# Patient Record
Sex: Male | Born: 1950
Health system: Southern US, Community
[De-identification: ages and names within clinical notes are randomized; demographics above are authoritative.]

## PROBLEM LIST (undated history)

## (undated) ENCOUNTER — Emergency Department (HOSPITAL_COMMUNITY): Discharge status: Absent without leave | Payer: Medicare Other

## (undated) DIAGNOSIS — F32A Depression, unspecified: Secondary | ICD-10-CM

## (undated) DIAGNOSIS — J449 Chronic obstructive pulmonary disease, unspecified: Secondary | ICD-10-CM

## (undated) DIAGNOSIS — F17200 Nicotine dependence, unspecified, uncomplicated: Secondary | ICD-10-CM

## (undated) DIAGNOSIS — E785 Hyperlipidemia, unspecified: Secondary | ICD-10-CM

## (undated) DIAGNOSIS — I10 Essential (primary) hypertension: Secondary | ICD-10-CM

## (undated) DIAGNOSIS — F1911 Other psychoactive substance abuse, in remission: Secondary | ICD-10-CM

## (undated) DIAGNOSIS — F329 Major depressive disorder, single episode, unspecified: Secondary | ICD-10-CM

## (undated) DIAGNOSIS — F1210 Cannabis abuse, uncomplicated: Secondary | ICD-10-CM

## (undated) DIAGNOSIS — I639 Cerebral infarction, unspecified: Secondary | ICD-10-CM

## (undated) DIAGNOSIS — R7303 Prediabetes: Secondary | ICD-10-CM

## (undated) DIAGNOSIS — R972 Elevated prostate specific antigen [PSA]: Secondary | ICD-10-CM

## (undated) DIAGNOSIS — Z87891 Personal history of nicotine dependence: Secondary | ICD-10-CM

## (undated) HISTORY — PX: COLONOSCOPY: SHX174

## (undated) HISTORY — DX: Major depressive disorder, single episode, unspecified: F32.9

## (undated) HISTORY — DX: Hyperlipidemia, unspecified: E78.5

## (undated) HISTORY — DX: Nicotine dependence, unspecified, uncomplicated: F17.200

## (undated) HISTORY — DX: Chronic obstructive pulmonary disease, unspecified: J44.9

## (undated) HISTORY — DX: Prediabetes: R73.03

## (undated) HISTORY — DX: Depression, unspecified: F32.A

## (undated) HISTORY — DX: Cerebral infarction, unspecified: I63.9

## (undated) HISTORY — DX: Elevated prostate specific antigen (PSA): R97.20

## (undated) HISTORY — DX: Essential (primary) hypertension: I10

---

## 2002-09-09 ENCOUNTER — Inpatient Hospital Stay (HOSPITAL_COMMUNITY): Admission: EM | Admit: 2002-09-09 | Discharge: 2002-09-13 | Payer: Self-pay | Admitting: Emergency Medicine

## 2002-09-09 ENCOUNTER — Encounter: Payer: Self-pay | Admitting: Emergency Medicine

## 2002-09-10 ENCOUNTER — Encounter: Payer: Self-pay | Admitting: Internal Medicine

## 2003-03-02 ENCOUNTER — Ambulatory Visit (HOSPITAL_COMMUNITY): Admission: RE | Admit: 2003-03-02 | Discharge: 2003-03-02 | Payer: Self-pay | Admitting: Internal Medicine

## 2003-03-07 ENCOUNTER — Encounter (HOSPITAL_COMMUNITY): Admission: RE | Admit: 2003-03-07 | Discharge: 2003-04-06 | Payer: Self-pay | Admitting: Internal Medicine

## 2003-08-11 ENCOUNTER — Ambulatory Visit (HOSPITAL_COMMUNITY): Admission: RE | Admit: 2003-08-11 | Discharge: 2003-08-11 | Payer: Self-pay | Admitting: Emergency Medicine

## 2003-08-22 ENCOUNTER — Inpatient Hospital Stay (HOSPITAL_COMMUNITY): Admission: EM | Admit: 2003-08-22 | Discharge: 2003-08-25 | Payer: Self-pay | Admitting: *Deleted

## 2003-08-28 ENCOUNTER — Emergency Department (HOSPITAL_COMMUNITY): Admission: EM | Admit: 2003-08-28 | Discharge: 2003-08-28 | Payer: Self-pay | Admitting: Emergency Medicine

## 2003-11-08 ENCOUNTER — Ambulatory Visit: Payer: Self-pay | Admitting: Family Medicine

## 2003-11-22 ENCOUNTER — Ambulatory Visit: Payer: Self-pay | Admitting: Internal Medicine

## 2003-11-22 ENCOUNTER — Ambulatory Visit (HOSPITAL_COMMUNITY): Admission: RE | Admit: 2003-11-22 | Discharge: 2003-11-22 | Payer: Self-pay | Admitting: Internal Medicine

## 2003-12-23 ENCOUNTER — Inpatient Hospital Stay (HOSPITAL_COMMUNITY): Admission: EM | Admit: 2003-12-23 | Discharge: 2003-12-26 | Payer: Self-pay | Admitting: Emergency Medicine

## 2003-12-28 ENCOUNTER — Ambulatory Visit: Payer: Self-pay | Admitting: Family Medicine

## 2004-02-01 ENCOUNTER — Ambulatory Visit: Payer: Self-pay | Admitting: Family Medicine

## 2004-02-17 ENCOUNTER — Ambulatory Visit (HOSPITAL_COMMUNITY): Admission: RE | Admit: 2004-02-17 | Discharge: 2004-02-17 | Payer: Self-pay | Admitting: Pulmonary Disease

## 2004-03-05 ENCOUNTER — Ambulatory Visit: Payer: Self-pay | Admitting: Family Medicine

## 2004-03-08 ENCOUNTER — Ambulatory Visit: Payer: Self-pay | Admitting: Family Medicine

## 2004-03-08 ENCOUNTER — Ambulatory Visit (HOSPITAL_COMMUNITY): Admission: RE | Admit: 2004-03-08 | Discharge: 2004-03-08 | Payer: Self-pay | Admitting: Family Medicine

## 2004-03-12 ENCOUNTER — Ambulatory Visit: Payer: Self-pay | Admitting: Family Medicine

## 2004-04-23 ENCOUNTER — Ambulatory Visit: Payer: Self-pay | Admitting: Family Medicine

## 2004-05-01 ENCOUNTER — Emergency Department (HOSPITAL_COMMUNITY): Admission: EM | Admit: 2004-05-01 | Discharge: 2004-05-01 | Payer: Self-pay | Admitting: Emergency Medicine

## 2005-11-04 ENCOUNTER — Ambulatory Visit: Payer: Self-pay | Admitting: Family Medicine

## 2005-11-05 ENCOUNTER — Ambulatory Visit (HOSPITAL_COMMUNITY): Admission: RE | Admit: 2005-11-05 | Discharge: 2005-11-05 | Payer: Self-pay | Admitting: Family Medicine

## 2005-12-10 ENCOUNTER — Ambulatory Visit: Payer: Self-pay | Admitting: Family Medicine

## 2006-01-07 DIAGNOSIS — I639 Cerebral infarction, unspecified: Secondary | ICD-10-CM

## 2006-01-07 HISTORY — DX: Cerebral infarction, unspecified: I63.9

## 2006-01-28 ENCOUNTER — Ambulatory Visit: Payer: Self-pay | Admitting: Family Medicine

## 2006-01-28 LAB — CONVERTED CEMR LAB
CO2: 25 meq/L (ref 19–32)
Calcium: 9.7 mg/dL (ref 8.4–10.5)
Chloride: 105 meq/L (ref 96–112)
Potassium: 4.3 meq/L (ref 3.5–5.3)
Sodium: 144 meq/L (ref 135–145)

## 2006-04-28 ENCOUNTER — Ambulatory Visit: Payer: Self-pay | Admitting: Family Medicine

## 2006-04-28 LAB — CONVERTED CEMR LAB
Albumin: 4.5 g/dL (ref 3.5–5.2)
Alkaline Phosphatase: 59 units/L (ref 39–117)
HDL: 64 mg/dL (ref >39)
LDL Cholesterol: 155 mg/dL — ABNORMAL HIGH (ref 0–99)
Total CHOL/HDL Ratio: 3.6
Total Protein: 7.1 g/dL (ref 6.0–8.3)
Triglycerides: 55 mg/dL (ref <150)

## 2006-06-05 ENCOUNTER — Ambulatory Visit: Payer: Self-pay | Admitting: Family Medicine

## 2006-06-17 ENCOUNTER — Encounter: Payer: Self-pay | Admitting: Family Medicine

## 2006-06-17 LAB — CONVERTED CEMR LAB
Calcium: 9.2 mg/dL (ref 8.4–10.5)
Chloride: 103 meq/L (ref 96–112)
Creatinine, Ser: 0.96 mg/dL (ref 0.40–1.50)
Sodium: 140 meq/L (ref 135–145)

## 2006-07-15 ENCOUNTER — Ambulatory Visit (HOSPITAL_COMMUNITY): Admission: RE | Admit: 2006-07-15 | Discharge: 2006-07-15 | Payer: Self-pay | Admitting: General Surgery

## 2006-07-15 LAB — HM COLONOSCOPY

## 2006-07-28 ENCOUNTER — Ambulatory Visit: Payer: Self-pay | Admitting: Family Medicine

## 2006-07-28 ENCOUNTER — Ambulatory Visit (HOSPITAL_COMMUNITY): Admission: RE | Admit: 2006-07-28 | Discharge: 2006-07-28 | Payer: Self-pay | Admitting: Family Medicine

## 2006-07-28 LAB — CONVERTED CEMR LAB
Hemoglobin: 13.6 g/dL (ref 13.0–17.0)
Lymphocytes Relative: 5 % — ABNORMAL LOW (ref 12–46)
MCHC: 33.3 g/dL (ref 30.0–36.0)
Monocytes Absolute: 0.6 10*3/uL (ref 0.2–0.7)
Monocytes Relative: 6 % (ref 3–11)
Neutro Abs: 10 10*3/uL — ABNORMAL HIGH (ref 1.7–7.7)
RBC: 4.41 M/uL (ref 4.22–5.81)

## 2006-07-29 ENCOUNTER — Ambulatory Visit: Payer: Self-pay | Admitting: Family Medicine

## 2006-07-30 ENCOUNTER — Ambulatory Visit: Payer: Self-pay | Admitting: Family Medicine

## 2006-07-30 LAB — CONVERTED CEMR LAB
Specific Gravity, Urine: 1.04 — ABNORMAL HIGH (ref 1.005–1.03)
pH: 5.5 (ref 5.0–8.0)

## 2006-07-31 ENCOUNTER — Ambulatory Visit: Payer: Self-pay | Admitting: Family Medicine

## 2006-07-31 LAB — CONVERTED CEMR LAB
Basophils Relative: 0 % (ref 0–1)
Eosinophils Absolute: 0 10*3/uL (ref 0.0–0.7)
Hemoglobin: 13.1 g/dL (ref 13.0–17.0)
MCHC: 32 g/dL (ref 30.0–36.0)
MCV: 94.7 fL (ref 78.0–100.0)
Monocytes Absolute: 0.3 10*3/uL (ref 0.2–0.7)
Monocytes Relative: 7 % (ref 3–11)
Neutro Abs: 1.8 10*3/uL (ref 1.7–7.7)
RBC: 4.33 M/uL (ref 4.22–5.81)

## 2006-08-01 ENCOUNTER — Ambulatory Visit: Payer: Self-pay | Admitting: Family Medicine

## 2006-08-08 ENCOUNTER — Ambulatory Visit: Payer: Self-pay | Admitting: Family Medicine

## 2006-08-11 ENCOUNTER — Encounter: Payer: Self-pay | Admitting: Family Medicine

## 2006-08-11 ENCOUNTER — Ambulatory Visit (HOSPITAL_COMMUNITY): Admission: RE | Admit: 2006-08-11 | Discharge: 2006-08-11 | Payer: Self-pay | Admitting: Family Medicine

## 2006-08-11 LAB — CONVERTED CEMR LAB
AST: 14 units/L (ref 0–37)
Albumin: 4.4 g/dL (ref 3.5–5.2)
Alkaline Phosphatase: 66 units/L (ref 39–117)
Basophils Absolute: 0 10*3/uL (ref 0.0–0.1)
Basophils Relative: 1 % (ref 0–1)
Bilirubin, Direct: 0.1 mg/dL (ref 0.0–0.3)
Eosinophils Absolute: 0 10*3/uL (ref 0.0–0.7)
HDL: 54 mg/dL (ref >39)
Hemoglobin: 13.5 g/dL (ref 13.0–17.0)
Indirect Bilirubin: 0.4 mg/dL (ref 0.0–0.9)
MCHC: 32.2 g/dL (ref 30.0–36.0)
Monocytes Absolute: 0.2 10*3/uL (ref 0.2–0.7)
Neutro Abs: 2.2 10*3/uL (ref 1.7–7.7)
RDW: 13.6 % (ref 11.5–14.0)
Total Bilirubin: 0.5 mg/dL (ref 0.3–1.2)

## 2006-08-12 ENCOUNTER — Encounter: Payer: Self-pay | Admitting: Family Medicine

## 2006-09-12 ENCOUNTER — Ambulatory Visit: Payer: Self-pay | Admitting: Family Medicine

## 2006-11-14 ENCOUNTER — Ambulatory Visit: Payer: Self-pay | Admitting: Family Medicine

## 2006-11-15 ENCOUNTER — Ambulatory Visit: Payer: Self-pay | Admitting: Cardiology

## 2006-11-15 ENCOUNTER — Inpatient Hospital Stay (HOSPITAL_COMMUNITY): Admission: EM | Admit: 2006-11-15 | Discharge: 2006-11-18 | Payer: Self-pay | Admitting: Emergency Medicine

## 2006-11-25 ENCOUNTER — Ambulatory Visit: Payer: Self-pay | Admitting: Family Medicine

## 2006-11-27 ENCOUNTER — Ambulatory Visit: Payer: Self-pay | Admitting: Family Medicine

## 2007-01-14 ENCOUNTER — Ambulatory Visit: Payer: Self-pay | Admitting: Family Medicine

## 2007-01-14 LAB — CONVERTED CEMR LAB
Alkaline Phosphatase: 50 units/L (ref 39–117)
BUN: 10 mg/dL (ref 6–23)
Bilirubin, Direct: 0.1 mg/dL (ref 0.0–0.3)
CO2: 27 meq/L (ref 19–32)
Chloride: 102 meq/L (ref 96–112)
Creatinine, Ser: 0.9 mg/dL (ref 0.40–1.50)
Glucose, Bld: 88 mg/dL (ref 70–99)
Indirect Bilirubin: 0.5 mg/dL (ref 0.0–0.9)
LDL Cholesterol: 125 mg/dL — ABNORMAL HIGH (ref 0–99)
Total Bilirubin: 0.6 mg/dL (ref 0.3–1.2)
VLDL: 18 mg/dL (ref 0–40)

## 2007-01-26 ENCOUNTER — Ambulatory Visit: Payer: Self-pay | Admitting: Family Medicine

## 2007-01-26 ENCOUNTER — Emergency Department (HOSPITAL_COMMUNITY): Admission: EM | Admit: 2007-01-26 | Discharge: 2007-01-26 | Payer: Self-pay | Admitting: Emergency Medicine

## 2007-02-03 ENCOUNTER — Ambulatory Visit: Payer: Self-pay | Admitting: Family Medicine

## 2007-04-28 ENCOUNTER — Ambulatory Visit: Payer: Self-pay | Admitting: Family Medicine

## 2007-05-25 ENCOUNTER — Encounter: Payer: Self-pay | Admitting: Family Medicine

## 2007-05-25 LAB — CONVERTED CEMR LAB
Albumin: 4.5 g/dL (ref 3.5–5.2)
HDL: 67 mg/dL (ref >39)
LDL Cholesterol: 146 mg/dL — ABNORMAL HIGH (ref 0–99)
Total Bilirubin: 0.4 mg/dL (ref 0.3–1.2)
Total CHOL/HDL Ratio: 3.4
Total Protein: 7.1 g/dL (ref 6.0–8.3)
Triglycerides: 68 mg/dL (ref <150)

## 2007-07-30 DIAGNOSIS — J449 Chronic obstructive pulmonary disease, unspecified: Secondary | ICD-10-CM | POA: Insufficient documentation

## 2007-07-30 DIAGNOSIS — J4489 Other specified chronic obstructive pulmonary disease: Secondary | ICD-10-CM | POA: Insufficient documentation

## 2007-07-30 DIAGNOSIS — E785 Hyperlipidemia, unspecified: Secondary | ICD-10-CM | POA: Insufficient documentation

## 2007-07-30 DIAGNOSIS — I10 Essential (primary) hypertension: Secondary | ICD-10-CM | POA: Insufficient documentation

## 2007-07-30 DIAGNOSIS — F17200 Nicotine dependence, unspecified, uncomplicated: Secondary | ICD-10-CM | POA: Insufficient documentation

## 2007-08-31 ENCOUNTER — Ambulatory Visit: Payer: Self-pay | Admitting: Family Medicine

## 2007-10-05 ENCOUNTER — Ambulatory Visit: Payer: Self-pay | Admitting: Family Medicine

## 2008-01-05 ENCOUNTER — Ambulatory Visit: Payer: Self-pay | Admitting: Family Medicine

## 2008-01-06 LAB — CONVERTED CEMR LAB
AST: 14 units/L (ref 0–37)
Albumin: 4.2 g/dL (ref 3.5–5.2)
Basophils Absolute: 0 10*3/uL (ref 0.0–0.1)
Bilirubin, Direct: 0.1 mg/dL (ref 0.0–0.3)
CO2: 26 meq/L (ref 19–32)
Calcium: 8.9 mg/dL (ref 8.4–10.5)
Chloride: 107 meq/L (ref 96–112)
Eosinophils Relative: 1 % (ref 0–5)
Glucose, Bld: 95 mg/dL (ref 70–99)
HCT: 42.3 % (ref 39.0–52.0)
HDL: 56 mg/dL (ref >39)
Hemoglobin: 13.7 g/dL (ref 13.0–17.0)
LDL Cholesterol: 145 mg/dL — ABNORMAL HIGH (ref 0–99)
Lymphocytes Relative: 37 % (ref 12–46)
Lymphs Abs: 1.1 10*3/uL (ref 0.7–4.0)
Neutro Abs: 1.5 10*3/uL — ABNORMAL LOW (ref 1.7–7.7)
PSA: 3.81 ng/mL (ref 0.10–4.00)
Platelets: 149 10*3/uL — ABNORMAL LOW (ref 150–400)
Sodium: 143 meq/L (ref 135–145)
Total Bilirubin: 0.3 mg/dL (ref 0.3–1.2)
Total CHOL/HDL Ratio: 3.8
VLDL: 9 mg/dL (ref 0–40)
WBC: 2.9 10*3/uL — ABNORMAL LOW (ref 4.0–10.5)

## 2008-01-15 ENCOUNTER — Ambulatory Visit: Payer: Self-pay | Admitting: Family Medicine

## 2008-05-05 ENCOUNTER — Ambulatory Visit: Payer: Self-pay | Admitting: Family Medicine

## 2008-05-06 LAB — CONVERTED CEMR LAB
ALT: 10 units/L (ref 0–53)
AST: 15 units/L (ref 0–37)
Albumin: 4 g/dL (ref 3.5–5.2)
Alkaline Phosphatase: 43 units/L (ref 39–117)
Cholesterol: 208 mg/dL — ABNORMAL HIGH (ref 0–200)
HDL: 56 mg/dL (ref >39)
TSH: 9.388 microintl units/mL — ABNORMAL HIGH (ref 0.350–4.500)
Total Bilirubin: 0.3 mg/dL (ref 0.3–1.2)
Total CHOL/HDL Ratio: 3.7
Total Protein: 6.2 g/dL (ref 6.0–8.3)
Triglycerides: 53 mg/dL (ref <150)

## 2008-05-09 ENCOUNTER — Telehealth: Payer: Self-pay | Admitting: Family Medicine

## 2008-05-23 ENCOUNTER — Ambulatory Visit: Payer: Self-pay | Admitting: Family Medicine

## 2008-06-09 ENCOUNTER — Telehealth: Payer: Self-pay | Admitting: Family Medicine

## 2008-06-13 ENCOUNTER — Telehealth: Payer: Self-pay | Admitting: Family Medicine

## 2008-08-16 ENCOUNTER — Encounter: Payer: Self-pay | Admitting: Family Medicine

## 2008-09-05 ENCOUNTER — Encounter: Payer: Self-pay | Admitting: Family Medicine

## 2008-09-13 ENCOUNTER — Ambulatory Visit: Payer: Self-pay | Admitting: Family Medicine

## 2008-09-14 ENCOUNTER — Encounter: Payer: Self-pay | Admitting: Family Medicine

## 2008-09-14 LAB — CONVERTED CEMR LAB
ALT: 17 units/L (ref 0–53)
AST: 20 units/L (ref 0–37)
BUN: 12 mg/dL (ref 6–23)
Basophils Absolute: 0 10*3/uL (ref 0.0–0.1)
Basophils Relative: 0 % (ref 0–1)
Bilirubin, Direct: 0.1 mg/dL (ref 0.0–0.3)
Calcium: 9.3 mg/dL (ref 8.4–10.5)
Cholesterol: 148 mg/dL (ref 0–200)
Glucose, Bld: 105 mg/dL — ABNORMAL HIGH (ref 70–99)
Hemoglobin: 13.1 g/dL (ref 13.0–17.0)
Indirect Bilirubin: 0.3 mg/dL (ref 0.0–0.9)
Lymphocytes Relative: 27 % (ref 12–46)
MCHC: 32.3 g/dL (ref 30.0–36.0)
Monocytes Absolute: 0.3 10*3/uL (ref 0.1–1.0)
Neutro Abs: 3.8 10*3/uL (ref 1.7–7.7)
Platelets: 159 10*3/uL (ref 150–400)
RDW: 13.3 % (ref 11.5–15.5)
Sodium: 143 meq/L (ref 135–145)
Total CHOL/HDL Ratio: 2.6
Total Protein: 6.7 g/dL (ref 6.0–8.3)
Triglycerides: 39 mg/dL (ref <150)

## 2008-09-18 DIAGNOSIS — J45909 Unspecified asthma, uncomplicated: Secondary | ICD-10-CM | POA: Insufficient documentation

## 2008-11-15 ENCOUNTER — Ambulatory Visit: Payer: Self-pay | Admitting: Family Medicine

## 2008-12-16 ENCOUNTER — Encounter (INDEPENDENT_AMBULATORY_CARE_PROVIDER_SITE_OTHER): Payer: Self-pay | Admitting: *Deleted

## 2009-01-02 ENCOUNTER — Emergency Department (HOSPITAL_COMMUNITY): Admission: EM | Admit: 2009-01-02 | Discharge: 2009-01-02 | Payer: Self-pay | Admitting: Emergency Medicine

## 2009-01-10 LAB — CONVERTED CEMR LAB
AST: 14 units/L (ref 0–37)
Alkaline Phosphatase: 52 units/L (ref 39–117)
BUN: 14 mg/dL (ref 6–23)
Bilirubin, Direct: 0.1 mg/dL (ref 0.0–0.3)
CO2: 23 meq/L (ref 19–32)
Calcium: 10.2 mg/dL (ref 8.4–10.5)
Creatinine, Ser: 0.93 mg/dL (ref 0.40–1.50)
Glucose, Bld: 159 mg/dL — ABNORMAL HIGH (ref 70–99)
Indirect Bilirubin: 0.3 mg/dL (ref 0.0–0.9)
PSA: 5.51 ng/mL — ABNORMAL HIGH (ref 0.10–4.00)
Total Bilirubin: 0.4 mg/dL (ref 0.3–1.2)

## 2009-01-25 ENCOUNTER — Ambulatory Visit: Payer: Self-pay | Admitting: Family Medicine

## 2009-01-25 DIAGNOSIS — R7302 Impaired glucose tolerance (oral): Secondary | ICD-10-CM | POA: Insufficient documentation

## 2009-01-25 DIAGNOSIS — R972 Elevated prostate specific antigen [PSA]: Secondary | ICD-10-CM | POA: Insufficient documentation

## 2009-01-27 ENCOUNTER — Encounter: Payer: Self-pay | Admitting: Family Medicine

## 2009-02-21 ENCOUNTER — Encounter: Payer: Self-pay | Admitting: Family Medicine

## 2009-03-14 ENCOUNTER — Ambulatory Visit: Payer: Self-pay | Admitting: Physician Assistant

## 2009-03-14 DIAGNOSIS — J209 Acute bronchitis, unspecified: Secondary | ICD-10-CM | POA: Insufficient documentation

## 2009-03-14 DIAGNOSIS — J441 Chronic obstructive pulmonary disease with (acute) exacerbation: Secondary | ICD-10-CM | POA: Insufficient documentation

## 2009-03-23 ENCOUNTER — Emergency Department (HOSPITAL_COMMUNITY): Admission: EM | Admit: 2009-03-23 | Discharge: 2009-03-23 | Payer: Self-pay | Admitting: Emergency Medicine

## 2009-03-23 ENCOUNTER — Ambulatory Visit: Payer: Self-pay | Admitting: Family Medicine

## 2009-03-23 DIAGNOSIS — E860 Dehydration: Secondary | ICD-10-CM | POA: Insufficient documentation

## 2009-03-23 DIAGNOSIS — R112 Nausea with vomiting, unspecified: Secondary | ICD-10-CM | POA: Insufficient documentation

## 2009-03-28 ENCOUNTER — Ambulatory Visit: Payer: Self-pay | Admitting: Family Medicine

## 2009-05-23 ENCOUNTER — Encounter: Payer: Self-pay | Admitting: Family Medicine

## 2009-06-27 LAB — CONVERTED CEMR LAB
BUN: 13 mg/dL (ref 6–23)
CO2: 30 meq/L (ref 19–32)
Chloride: 97 meq/L (ref 96–112)
Creatinine, Ser: 1.01 mg/dL (ref 0.40–1.50)
Glucose, Bld: 105 mg/dL — ABNORMAL HIGH (ref 70–99)
HDL: 51 mg/dL (ref >39)
Hgb A1c MFr Bld: 6.1 % — ABNORMAL HIGH (ref <5.7)
LDL Cholesterol: 90 mg/dL (ref 0–99)

## 2009-06-28 ENCOUNTER — Ambulatory Visit: Payer: Self-pay | Admitting: Family Medicine

## 2009-06-28 DIAGNOSIS — E559 Vitamin D deficiency, unspecified: Secondary | ICD-10-CM | POA: Insufficient documentation

## 2009-10-26 ENCOUNTER — Ambulatory Visit: Payer: Self-pay | Admitting: Family Medicine

## 2009-10-26 DIAGNOSIS — E119 Type 2 diabetes mellitus without complications: Secondary | ICD-10-CM | POA: Insufficient documentation

## 2009-10-26 LAB — CONVERTED CEMR LAB
Basophils Absolute: 0 10*3/uL (ref 0.0–0.1)
CO2: 29 meq/L (ref 19–32)
Chloride: 102 meq/L (ref 96–112)
Creatinine, Ser: 0.99 mg/dL (ref 0.40–1.50)
Hemoglobin: 14.2 g/dL (ref 13.0–17.0)
Lymphocytes Relative: 51 % — ABNORMAL HIGH (ref 12–46)
Monocytes Absolute: 0.3 10*3/uL (ref 0.1–1.0)
Neutro Abs: 1.3 10*3/uL — ABNORMAL LOW (ref 1.7–7.7)
RDW: 13.4 % (ref 11.5–15.5)
Sodium: 142 meq/L (ref 135–145)

## 2009-11-28 ENCOUNTER — Encounter: Payer: Self-pay | Admitting: Family Medicine

## 2009-12-14 ENCOUNTER — Encounter: Payer: Self-pay | Admitting: Family Medicine

## 2009-12-15 ENCOUNTER — Encounter: Payer: Self-pay | Admitting: Family Medicine

## 2009-12-21 ENCOUNTER — Encounter: Payer: Self-pay | Admitting: Family Medicine

## 2009-12-24 ENCOUNTER — Emergency Department (HOSPITAL_COMMUNITY)
Admission: EM | Admit: 2009-12-24 | Discharge: 2009-12-24 | Payer: Self-pay | Source: Home / Self Care | Admitting: Emergency Medicine

## 2009-12-30 ENCOUNTER — Inpatient Hospital Stay (HOSPITAL_COMMUNITY): Admission: EM | Admit: 2009-12-30 | Discharge: 2010-01-05 | Payer: Self-pay | Source: Home / Self Care

## 2010-01-15 ENCOUNTER — Ambulatory Visit
Admission: RE | Admit: 2010-01-15 | Discharge: 2010-01-15 | Payer: Self-pay | Source: Home / Self Care | Attending: Family Medicine | Admitting: Family Medicine

## 2010-01-26 NOTE — H&P (Signed)
[name], [name]                ACCOUNT NO.:  [id number]  MEDICAL RECORD NO.:  [id number]          PATIENT TYPE:  INP  LOCATION:  IC03                          FACILITY:  [facility]  PHYSICIAN:  [name], MD     DATE OF BIRTH:  1950/10/27  DATE OF ADMISSION:  12/30/2009 DATE OF DISCHARGE:  LH                             HISTORY & PHYSICAL   PRIMARY CARE PHYSICIAN:  Dr. Syliva Overman.  ADMISSION DIAGNOSES: 1. Status asthmaticus. 2. Acute respiratory failure. 3. History of alcoholism in the past.  CHIEF COMPLAINT:  Shortness of breath.  HISTORY OF PRESENT ILLNESS:  The patient is a 60 year old African American male with a history of asthma, alcoholism, and previous history of stroke who presented to the hospital with complaints of shortness of breath.  He was seen in the emergency department on December 18 with shortness of breath.  He received breathing treatments and was sent home with prednisone and albuterol inhaler.  The patient came back today because he had not shown any improvement. By the time I saw him, the patient was already intubated.  Hence, no history is available from the patient directly.  There is no family member available, as well.  According to the ED physician's note, he was having severe wheezing and shortness of breath over the course of the day today.  The patient denied any chest pain or fever to the ED physician.  He did finish the course of his prednisone.  MEDICATIONS AT HOME:  Unknown at this time.  ALLERGIES:  No known drug allergies.  PAST MEDICAL HISTORY:  Based on previous reports, he has a history of stroke back in 2008 which caused diplopia.  He has a history of alcoholism, polysubstance drug abuse.  There is a history of GI bleed as well in the past.  He had an EGD back in 2005 which showed gastric ulcerations and coffee-ground material in the stomach.  He was treated with PPI and had a repeat endoscopy a few weeks later, and it  showed healed gastric ulcers.  He had a colonoscopy in February 2005 which showed tiny diverticula.  He also had EGD back in September 2004 as well.  SOCIAL HISTORY:  Apart from what is gleaned from the previous reports of a history of alcoholism, polysubstance abuse and tobacco, there is no other history available.  FAMILY HISTORY:  Unable to obtain at this time.  REVIEW OF SYSTEMS:  Unable to do.  PHYSICAL EXAMINATION:  VITAL SIGNS:  Temperature is not recorded yet. Blood pressure when he came in was 240/116, blood pressure subsequently was 159/87, heart rate 140 and regular, respiratory rate was 32, saturation 96% on O2. GENERAL:  He is a thin [place] American male intubated, only partially sedated at this time, so hence he is moving around quite a bit. HEENT: Head is normocephalic, atraumatic.  Pupils are equal reacting. No pallor, no icterus.  Oral mucous membranes appear to be moist. NECK:  Soft and supple.  No thyromegaly appreciated. LUNGS:  Reveal diffuse end-expiratory wheezing bilaterally with a few rhonchi.  No crackles are present. CARDIOVASCULAR:  S1, S2  tachycardic, regular.  No S3-S4, rubs, murmurs or bruits. ABDOMEN:  Soft, nontender, nondistended.  Bowel sounds are present.  No masses or organomegaly is appreciated. GU: External genitalia appear to be normal. MUSCULOSKELETAL:  Normal muscle mass and tone. NEUROLOGICALLY:  He is intubated and partially sedated.  No focal deficits appreciated at this time. SKIN:  Does not reveal any new rashes.  LABORATORY DATA:  White cell count of 13,200, hemoglobin is 14.0, platelet count is 232.  Electrolytes are normal.  Urine drug screen positive for benzodiazepines and marijuana.  Urine was hazy, specific gravity greater than 1.030, moderate blood, 11-20 WBCs, many bacteria. ABG showed a pH of 7.0, pCO2 is 135, pO2 is 472, bicarbonate is 31, saturation 98%.  EKG shows sinus tachycardia at 132 with normal axis. Intervals  appear to be in the normal range.  There are nonspecific changes, probably rate related.  The patient had chest x-ray which showed the ET tube 5 cm above the carina.  No acute lung abnormality was noted.  Chest x-ray from December 18 showed stable hyperinflation without any acute findings either.  ASSESSMENT:  This is a 60 year old call African American male with a history of asthma who presents with a worsening shortness of breath.  He was found to be in acute respiratory failure and had to be intubated for airway protection and for respiratory failure. 1. Status asthmaticus with respiratory failure.  He has been     intubated.  He will be sedated appropriately.  He will be given IV     Solu-Medrol, antibiotics, nebulizer treatments.  We will consult     Dr. Juanetta Gosling for ventilator management.  ABGs will be followed up.     His repeat gas did show improved pH 7.22, pCO2 is down from 135 to     77. 2. History of alcoholism.  Will give him thiamine.  He will be sedated     for at least a couple of days, so we will monitor him closely. 3. Tobacco abuse.  He should be counseled when he is extubated. 4. Leukocytosis.  We will give him Avelox for now and monitor him     closely.  His temperature will be recorded as well. 5. Sinus tachycardia, probably from his acute respiratory distress.     We will monitor him closely.  There is a very low probability for     pulmonary embolus at this time considering his history of asthma     and his wheezing, but we will monitor him closely. 6. Mild dehydration.  Will give him IV fluids.  Nutrition will be     assessed in the next 24 hours.  The patient is a full code.  Further management decisions will depend on results of further testing and patient's response to treatment.  [name], MD     GK/MEDQ  D:  12/31/2009  T:  12/31/2009  Job:  [id number]  cc:   Ramon Dredge L. Juanetta Gosling, M.D. Fax: [phone]  Milus Mallick. Lodema Hong, M.D. Fax:  [phone]  Electronically Signed by [name] MD on 01/25/2010 07:26:53 PM

## 2010-01-28 ENCOUNTER — Encounter (INDEPENDENT_AMBULATORY_CARE_PROVIDER_SITE_OTHER): Payer: Self-pay | Admitting: Internal Medicine

## 2010-02-04 ENCOUNTER — Inpatient Hospital Stay (HOSPITAL_COMMUNITY)
Admission: EM | Admit: 2010-02-04 | Discharge: 2010-02-08 | DRG: 189 | Disposition: A | Discharge status: Home Health | Payer: Medicare Other | Attending: Internal Medicine | Admitting: Internal Medicine

## 2010-02-04 DIAGNOSIS — R000 Tachycardia, unspecified: Secondary | ICD-10-CM | POA: Diagnosis present

## 2010-02-04 DIAGNOSIS — J9600 Acute respiratory failure, unspecified whether with hypoxia or hypercapnia: Principal | ICD-10-CM | POA: Diagnosis present

## 2010-02-04 DIAGNOSIS — R6889 Other general symptoms and signs: Secondary | ICD-10-CM | POA: Diagnosis present

## 2010-02-04 DIAGNOSIS — T380X5A Adverse effect of glucocorticoids and synthetic analogues, initial encounter: Secondary | ICD-10-CM | POA: Diagnosis not present

## 2010-02-04 DIAGNOSIS — D72829 Elevated white blood cell count, unspecified: Secondary | ICD-10-CM | POA: Diagnosis not present

## 2010-02-04 DIAGNOSIS — J440 Chronic obstructive pulmonary disease with acute lower respiratory infection: Secondary | ICD-10-CM | POA: Diagnosis present

## 2010-02-04 DIAGNOSIS — R9389 Abnormal findings on diagnostic imaging of other specified body structures: Secondary | ICD-10-CM | POA: Diagnosis present

## 2010-02-04 DIAGNOSIS — F1210 Cannabis abuse, uncomplicated: Secondary | ICD-10-CM | POA: Diagnosis present

## 2010-02-04 DIAGNOSIS — F17200 Nicotine dependence, unspecified, uncomplicated: Secondary | ICD-10-CM | POA: Diagnosis present

## 2010-02-04 DIAGNOSIS — J209 Acute bronchitis, unspecified: Secondary | ICD-10-CM | POA: Diagnosis present

## 2010-02-04 LAB — CBC
HCT: 40.3 % (ref 39.0–52.0)
MCHC: 33 g/dL (ref 30.0–36.0)
MCV: 92.6 fL (ref 78.0–100.0)
RDW: 13.3 % (ref 11.5–15.5)
WBC: 9.1 10*3/uL (ref 4.0–10.5)

## 2010-02-04 LAB — POCT CARDIAC MARKERS
CKMB, poc: 2.8 ng/mL (ref 1.0–8.0)
Myoglobin, poc: 145 ng/mL (ref 12–200)

## 2010-02-04 LAB — DIFFERENTIAL
Eosinophils Relative: 1 % (ref 0–5)
Lymphocytes Relative: 42 % (ref 12–46)
Lymphs Abs: 3.8 10*3/uL (ref 0.7–4.0)
Monocytes Absolute: 0.7 10*3/uL (ref 0.1–1.0)

## 2010-02-04 LAB — COMPREHENSIVE METABOLIC PANEL
Albumin: 3.9 g/dL (ref 3.5–5.2)
BUN: 6 mg/dL (ref 6–23)
Chloride: 103 mEq/L (ref 96–112)
Creatinine, Ser: 0.9 mg/dL (ref 0.4–1.5)
Glucose, Bld: 173 mg/dL — ABNORMAL HIGH (ref 70–99)
Total Bilirubin: 0.6 mg/dL (ref 0.3–1.2)

## 2010-02-04 LAB — BLOOD GAS, ARTERIAL
Acid-Base Excess: 2.8 mmol/L — ABNORMAL HIGH (ref 0.0–2.0)
Bicarbonate: 25.8 mEq/L — ABNORMAL HIGH (ref 20.0–24.0)
FIO2: 50 %
FIO2: 50 %
O2 Saturation: 99.2 %
O2 Saturation: 99.4 %
Patient temperature: 37
Patient temperature: 37
TCO2: 23.6 mmol/L (ref 0–100)
pCO2 arterial: 49.9 mmHg — ABNORMAL HIGH (ref 35.0–45.0)

## 2010-02-04 LAB — GLUCOSE, CAPILLARY
Glucose-Capillary: 149 mg/dL — ABNORMAL HIGH (ref 70–99)
Glucose-Capillary: 167 mg/dL — ABNORMAL HIGH (ref 70–99)

## 2010-02-05 ENCOUNTER — Encounter: Payer: Self-pay | Admitting: Family Medicine

## 2010-02-05 LAB — GLUCOSE, CAPILLARY: Glucose-Capillary: 157 mg/dL — ABNORMAL HIGH (ref 70–99)

## 2010-02-05 LAB — BLOOD GAS, ARTERIAL
Bicarbonate: 27.5 mEq/L — ABNORMAL HIGH (ref 20.0–24.0)
O2 Content: 3 L/min
pCO2 arterial: 46 mmHg — ABNORMAL HIGH (ref 35.0–45.0)
pH, Arterial: 7.394 (ref 7.350–7.450)
pO2, Arterial: 76 mmHg — ABNORMAL LOW (ref 80.0–100.0)

## 2010-02-06 LAB — CBC
HCT: 36.8 % — ABNORMAL LOW (ref 39.0–52.0)
Hemoglobin: 12.3 g/dL — ABNORMAL LOW (ref 13.0–17.0)
MCH: 30.5 pg (ref 26.0–34.0)
MCHC: 33.4 g/dL (ref 30.0–36.0)
MCV: 91.3 fL (ref 78.0–100.0)
RBC: 4.03 MIL/uL — ABNORMAL LOW (ref 4.22–5.81)

## 2010-02-06 LAB — GLUCOSE, CAPILLARY: Glucose-Capillary: 141 mg/dL — ABNORMAL HIGH (ref 70–99)

## 2010-02-06 LAB — DIFFERENTIAL
Lymphs Abs: 1 10*3/uL (ref 0.7–4.0)
Monocytes Absolute: 0.7 10*3/uL (ref 0.1–1.0)
Monocytes Relative: 4 % (ref 3–12)
Neutro Abs: 15.3 10*3/uL — ABNORMAL HIGH (ref 1.7–7.7)
Neutrophils Relative %: 90 % — ABNORMAL HIGH (ref 43–77)

## 2010-02-06 LAB — BASIC METABOLIC PANEL
CO2: 25 mEq/L (ref 19–32)
Chloride: 107 mEq/L (ref 96–112)
GFR calc Af Amer: >60 mL/min (ref >60)
Potassium: 4.3 mEq/L (ref 3.5–5.1)

## 2010-02-06 NOTE — Letter (Signed)
Summary: NEBULIZER  NEBULIZER   Imported By: Lind Guest 02/21/2009 10:53:18  _____________________________________________________________________  External Attachment:    Type:   Image     Comment:   External Document

## 2010-02-06 NOTE — Letter (Signed)
Summary: dr. Dennie Maizes  dr. Dennie Maizes   Imported By: Lind Guest 12/12/2009 09:22:13  _____________________________________________________________________  External Attachment:    Type:   Image     Comment:   External Document

## 2010-02-06 NOTE — Letter (Signed)
Summary: certification of disability  certification of disability   Imported By: Lind Guest 01/27/2009 09:36:40  _____________________________________________________________________  External Attachment:    Type:   Image     Comment:   External Document

## 2010-02-06 NOTE — Assessment & Plan Note (Signed)
Summary: OFFICE VISIT   Vital Signs:  Patient profile:   60 year old male Height:      69 inches Weight:      118.95 pounds BMI:     17.63 O2 Sat:      99 % on Room air Pulse rate:   87 / minute Pulse rhythm:   regular Resp:     16 per minute BP sitting:   124 / 80  (left arm)  Vitals Entered By: Mauricia Area CMA (October 26, 2009 9:10 AM)  O2 Flow:  Room air CC: follow up   CC:  follow up.  History of Present Illness: Reports  that he is doing fairly well. He still smokes, has no plan to quit , states that's all he has to do and is involved with no-one essentially. Denies recent fever or chills. Denies sinus pressure, nasal congestion , ear pain or sore throat. Denies chest congestion, or cough productive of sputum. Denies chest pain, palpitations, PND, orthopnea or leg swelling. Denies abdominal pain, nausea, vomitting, diarrhea or constipation. Denies change in bowel movements or bloody stool. Denies dysuria , frequency, incontinence or hesitancy. Denies  joint pain, swelling, or reduced mobility. Denies headaches, vertigo, seizures. Denies uncontrolled depression,  or insomnia. Denies  rash, lesions, or itch.     Preventive Screening-Counseling & Management  Alcohol-Tobacco     Smoking Cessation Counseling: yes  Current Medications (verified): 1)  Maxzide-25 37.5-25 Mg Tabs (Triamterene-Hctz) .... Take 1 Tablet By Mouth Once A Day 2)  Simvastatin 40 Mg Tabs (Simvastatin) .... Take 1 Tab By Mouth At Bedtime  Allergies (verified): No Known Drug Allergies  Review of Systems      See HPI General:  Complains of fatigue. Eyes:  Denies double vision, eye pain, and red eye. Psych:  Complains of depression; denies suicidal thoughts/plans, thoughts of violence, and unusual visions or sounds. Endo:  Denies cold intolerance, excessive thirst, excessive urination, and heat intolerance. Heme:  Denies abnormal bruising and bleeding. Allergy:  Denies hives or rash and  itching eyes.  Physical Exam  General:  Well-developed,under-nourished,in no acute distress; alert,appropriate and cooperative throughout examination HEENT: No facial asymmetry,  EOMI, No sinus tenderness, TM's Clear, oropharynx  pink and moist.   Chest: decreased air entry bilaterally CVS: S1, S2, No murmurs, No S3.   Abd: Soft, Nontender.  MS: Adequate ROM spine, hips, shoulders and knees.  Ext: No edema.   CNS: CN 2-12 intact, power tone and sensation normal throughout.   Skin: Intact, no visible lesions or rashes.  Psych: Good eye contact, normal affect.  Memory intact, not anxious or depressed appearing.    Impression & Recommendations:  Problem # 1:  PREDIABETES (ICD-790.29) Assessment Comment Only pt counselled re impt of keeping sugAR AND CARB INTAKE DOWN  Problem # 2:  ASTHMA (ICD-493.90) Assessment: Unchanged  The following medications were removed from the medication list:    Duoneb 0.5-2.5 (3) Mg/24ml Soln (Ipratropium-albuterol) ..... One inhalation 3 times daily as needed for severe wheezing  Problem # 3:  NICOTINE ADDICTION (ICD-305.1) Assessment: Unchanged  Encouraged smoking cessation and discussed different methods for smoking cessation.   Problem # 4:  HYPERTENSION (ICD-401.9) Assessment: Unchanged  His updated medication list for this problem includes:    Maxzide-25 37.5-25 Mg Tabs (Triamterene-hctz) .[name] Kitchen... Take 1 tablet by mouth once a day  Orders: T-Basic Metabolic Panel [id number])  BP today: 124/80 Prior BP: 114/80 (06/28/2009)  Labs Reviewed: K+: 3.9 (06/26/2009) Creat: : 1.01 (06/26/2009)  Chol: 151 (06/26/2009)   HDL: 51 (06/26/2009)   LDL: 90 (06/26/2009)   TG: 50 (06/26/2009)  Problem # 5:  HYPERLIPIDEMIA (ICD-272.4) Assessment: Unchanged  His updated medication list for this problem includes:    Simvastatin 40 Mg Tabs (Simvastatin) .[name] Kitchen... Take 1 tab by mouth at bedtime  Labs Reviewed: SGOT: 14 (01/09/2009)   SGPT: 16  (01/09/2009)   HDL:51 (06/26/2009), 66 (01/09/2009)  LDL:90 (06/26/2009), 161 (16/10/9602)  Chol:151 (06/26/2009), 237 (01/09/2009)  Trig:50 (06/26/2009), 49 (01/09/2009)  Orders: Medicare Electronic Prescription [phone])  Complete Medication List: 1)  Maxzide-25 37.5-25 Mg Tabs (Triamterene-hctz) .... Take 1 tablet by mouth once a day 2)  Simvastatin 40 Mg Tabs (Simvastatin) .... Take 1 tab by mouth at bedtime  Other Orders: T-CBC w/Diff ([id number]) T- Hemoglobin A1C (62130-86578) T-TSH ([id number])  Patient Instructions: 1)  Please schedule a follow-up appointment in 3 months. 2)  Tobacco is very bad for your health and your loved ones! You Should stop smoking!. 3)  Stop Smoking Tips: Choose a Quit date. Cut down before the Quit date. decide what you will do as a substitute when you feel the urge to smoke(gum,toothpick,exercise). 4)  BMP prior to visit, ICD-9: 5)  TSH prior to visit, ICD-9: 6)  CBC w/ Diff prior to visit, ICD-9:  today 7)  HbgA1C prior to visit, ICD-9: Prescriptions: SIMVASTATIN 40 MG TABS (SIMVASTATIN) Take 1 tab by mouth at bedtime  #30 x 3   Entered by:   Adella Hare LPN   Authorized by:   Syliva Overman MD   Signed by:   Adella Hare LPN on 13/24/4010   Method used:   Electronically to        [facility] (retail)       [address] [place], [place]  [address]       Ph: [phone]       Fax: [phone]   RxID:   [id number] MAXZIDE-25 37.5-25 MG TABS (TRIAMTERENE-HCTZ) Take 1 tablet by mouth once a day  #30 Tablet x 3   Entered by:   Adella Hare LPN   Authorized by:   Syliva Overman MD   Signed by:   Adella Hare LPN on 16/01/930   Method used:   Electronically to        [facility] (retail)       [address] [place], [place]  [address]       Ph: [phone]       Fax: [phone]   RxID:   [id number]    Orders Added: 1)  Est. Patient  Level IV [54627] 2)  T-Basic Metabolic Panel [[id number]] 3)  T-CBC w/Diff [03500-93818] 4)  T- Hemoglobin A1C [83036-23375] 5)  T-TSH [29937-16967] 6)  Medicare Electronic Prescription [E9381]

## 2010-02-06 NOTE — Assessment & Plan Note (Signed)
Summary: er follow up - room 1   Vital Signs:  Patient profile:   60 year old male Height:      69 inches Weight:      124.75 pounds O2 Sat:      96 % on Room air Pulse rate:   86 / minute Resp:     16 per minute BP sitting:   130 / 80  (left arm)  Vitals Entered By: Adella Hare LPN (March 28, 2009 2:37 PM) CC: er follow up Is Patient Diabetic? No Pain Assessment Patient in pain? no      Comments patient reports feeling much better   CC:  er follow up.  History of Present Illness: Pt was seen in the ER recently for dehydration due to nausea & vomiting.  He states he is feeling much better.  Appetite is nl now.  N/V has resolved.  BM's also nl.  Hx of Asthma.  States his breathing has been doing well.  No wheeze. Also hx of htn & hyperlipidemia. States he is taking his meds daily.  Pt was to have a referral to urology for elevated PSA.  Pt states he has not seen a urologist.   Current Medications (verified): 1)  Simvastatin 80 Mg Tabs (Simvastatin) .... Take 1 Tab By Mouth At Bedtime 2)  Maxzide-25 37.5-25 Mg Tabs (Triamterene-Hctz) .... Take 1 Tablet By Mouth Once A Day 3)  Duoneb 0.5-2.5 (3) Mg/86ml Soln (Ipratropium-Albuterol) .... One Inhalation 3 Times Daily As Needed For Severe Wheezing 4)  Ciprofloxacin Hcl 500 Mg Tabs (Ciprofloxacin Hcl) .... Take 1 Tablet By Mouth Two Times A Day  Allergies (verified): No Known Drug Allergies  Past History:  Past medical history reviewed for relevance to current acute and chronic problems.  Past Medical History: Reviewed history from 08/31/2007 and no changes required. Current Problems:  NICOTINE ADDICTION (ICD-305.1) COPD (ICD-496) DEPRESSION (ICD-311) HYPERLIPIDEMIA (ICD-272.4) HYPERTENSION (ICD-401.9) CVA with temporary viion loss in 2008  Review of Systems General:  Denies chills and fever. ENT:  Denies earache, nasal congestion, and sinus pressure. CV:  Denies chest pain or discomfort. Resp:  Denies cough and  shortness of breath. GI:  Denies change in bowel habits, vomiting, and vomiting blood.  Physical Exam  General:  alert, well-hydrated, and overweight-appearing.   Head:  Normocephalic and atraumatic without obvious abnormalities. No apparent alopecia or balding. Ears:  External ear exam shows no significant lesions or deformities.  Otoscopic examination reveals clear canals, tympanic membranes are intact bilaterally without bulging, retraction, inflammation or discharge. Hearing is grossly normal bilaterally. Nose:  External nasal examination shows no deformity or inflammation. Nasal mucosa are pink and moist without lesions or exudates. Mouth:  Oral mucosa and oropharynx without lesions or exudates.  Teeth in good repair. Neck:  No deformities, masses, or tenderness noted. Lungs:  Normal respiratory effort, chest expands symmetrically. Lungs are clear to auscultation, no crackles or wheezes. Heart:  Normal rate and regular rhythm. S1 and S2 normal without gallop, murmur, click, rub or other extra sounds. Cervical Nodes:  No lymphadenopathy noted Psych:  Cognition and judgment appear intact. Alert and cooperative with normal attention span and concentration. No apparent delusions, illusions, hallucinations   Impression & Recommendations:  Problem # 1:  HYPERTENSION (ICD-401.9) Assessment Improved  His updated medication list for this problem includes:    Maxzide-25 37.5-25 Mg Tabs (Triamterene-hctz) .[name] Kitchen... Take 1 tablet by mouth once a day  Orders: T-Basic Metabolic Panel [id number])  BP today: 130/80 Prior BP: 150/84 (  03/23/2009)  Labs Reviewed: K+: 4.4 (01/09/2009) Creat: : 0.93 (01/09/2009)   Chol: 237 (01/09/2009)   HDL: 66 (01/09/2009)   LDL: 161 (01/09/2009)   TG: 49 (01/09/2009)  Problem # 2:  HYPERLIPIDEMIA (ICD-272.4) Assessment: Comment Only  His updated medication list for this problem includes:    Simvastatin 80 Mg Tabs (Simvastatin) .[name] Kitchen... Take 1 tab by mouth at  bedtime  Orders: T-Lipid Profile [id number])  Labs Reviewed: SGOT: 14 (01/09/2009)   SGPT: 16 (01/09/2009)   HDL:66 (01/09/2009), 58 (09/14/2008)  LDL:161 (01/09/2009), 82 (36/64/4034)  Chol:237 (01/09/2009), 148 (09/14/2008)  Trig:49 (01/09/2009), 39 (09/14/2008)  Problem # 3:  CHRONIC OBSTRUCTIVE PULMONARY DISEASE, ACUTE EXACERBATION (ICD-491.21) Assessment: Comment Only  Problem # 4:  PSA, INCREASED (ICD-790.93)  Orders: Urology Referral (Urology)  Complete Medication List: 1)  Simvastatin 80 Mg Tabs (Simvastatin) .... Take 1 tab by mouth at bedtime 2)  Maxzide-25 37.5-25 Mg Tabs (Triamterene-hctz) .... Take 1 tablet by mouth once a day 3)  Duoneb 0.5-2.5 (3) Mg/54ml Soln (Ipratropium-albuterol) .... One inhalation 3 times daily as needed for severe wheezing 4)  Ciprofloxacin Hcl 500 Mg Tabs (Ciprofloxacin hcl) .... Take 1 tablet by mouth two times a day  Other Orders: T- Hemoglobin A1C ([id number])  Patient Instructions: 1)  Please schedule a follow-up appointment in 3 months. 2)  Tobacco is very bad for your health and your loved ones! You Should stop smoking!. 3)  Stop Smoking Tips: Choose a Quit date. Cut down before the Quit date. decide what you will do as a substitute when you feel the urge to smoke(gum,toothpick,exercise). 4)  BMP prior to visit, 5)  Lipid Panel prior to visit, 6)  I have referred you to a urologist about your abnormal prostate blood test. Prescriptions: DUONEB 0.5-2.5 (3) MG/3ML SOLN (IPRATROPIUM-ALBUTEROL) one inhalation 3 times daily as needed for severe wheezing  #60 x 2   Entered and Authorized by:   Esperanza Sheets PA   Signed by:   Esperanza Sheets PA on 03/28/2009   Method used:   Electronically to        [facility] (retail)       [address] [place], [place]  [address]       Ph: [phone]       Fax: [phone]   RxID:   [id number]

## 2010-02-06 NOTE — Assessment & Plan Note (Signed)
Summary: ABN LABS   Vital Signs:  Patient profile:   60 year old male Height:      69 inches Weight:      129 pounds BMI:     19.12 O2 Sat:      97 % Pulse rate:   72 / minute Pulse rhythm:   regular Resp:     16 per minute BP sitting:   120 / 80 Cuff size:   regular  Vitals Entered By: Everitt Amber (January 25, 2009 10:44 AM) CC: Follow up chronic problems   CC:  Follow up chronic problems.  History of Present Illness: Reports  that the has been  doing well. Denies recent fever or chills. Denies sinus pressure, nasal congestion , ear pain or sore throat. Denies chest congestion, or cough productive of sputum. Denies chest pain, palpitations, PND, orthopnea or leg swelling. Denies abdominal pain, nausea, vomitting, diarrhea or constipation. Denies change in bowel movements or bloody stool. Denies dysuria , frequency, incontinence or hesitancy. Denies  joint pain, swelling, or reduced mobility. Denies headaches, vertigo, seizures. Denies depression, anxiety or insomnia.He has stopped the prozac for several mths and sees no need to resume it. He still smokes 2 to 3 ciggs daily. Denies  rash, lesions, or itch.      Preventive Screening-Counseling & Management  Alcohol-Tobacco     Smoking Cessation Counseling: yes  Current Medications (verified): 1)  Simvastatin 80 Mg Tabs (Simvastatin) .... Take 1 Tab By Mouth At Bedtime 2)  Fluoxetine Hcl 10 Mg  Tabs (Fluoxetine Hcl) .... One Tab By Mouth Once Daily 3)  Maxzide-25 37.5-25 Mg Tabs (Triamterene-Hctz) .... Take 1 Tablet By Mouth Once A Day 4)  Duoneb 0.5-2.5 (3) Mg/75ml Soln (Ipratropium-Albuterol) .... One Inhalation 3 Times Daily As Needed For Severe Wheezing  Allergies (verified): No Known Drug Allergies  Review of Systems      See HPI Eyes:  Denies blurring and red eye. Neuro:  Denies headaches, seizures, and sensation of room spinning. Endo:  Denies cold intolerance, excessive hunger, excessive thirst,  excessive urination, heat intolerance, polyuria, and weight change. Heme:  Denies abnormal bruising and bleeding. Allergy:  Complains of seasonal allergies.  Physical Exam  General:  alert, well-hydrated, and well developed.  HEENT: No facial asymmetry,  EOMI, No sinus tenderness, TM's Clear, oropharynx  pink and moist.   Chest: decreased air entry , no crackles or wheezing. CVS: S1, S2, No murmurs, No S3.   Abd: Soft, Nontender.  MS: Adequate ROM spine, hips, shoulders and knees.  Ext: No edema.   CNS: CN 2-12 intact, power tone and sensation normal throughout.   Skin: Intact, no visible lesions or rashes.  Psych: Good eye contact,FLAT affect.  Memory intact, not anxious or depressed appearing.  well-developed.     Impression & Recommendations:  Problem # 1:  IMPAIRED FASTING GLUCOSE (ICD-790.21) Assessment Comment Only  Orders: T- Hemoglobin A1C ([id number]), WITHIN NL  Problem # 2:  PSA, INCREASED (ICD-790.93) Assessment: Comment Only  Orders: Urology Referral (Urology)  Problem # 3:  NICOTINE ADDICTION (ICD-305.1) Assessment: Unchanged  Encouraged smoking cessation and discussed different methods for smoking cessation.   Problem # 4:  ASTHMA (ICD-493.90) Assessment: Deteriorated  The following medications were removed from the medication list:    Symbicort 80-4.5 Mcg/act Aero (Budesonide-formoterol fumarate) .[name] Kitchen... 2 puffs twice daily His updated medication list for this problem includes:    Duoneb 0.5-2.5 (3) Mg/39ml Soln (Ipratropium-albuterol) ..... One inhalation 3 times daily as needed for severe  wheezing  Problem # 5:  HYPERTENSION (ICD-401.9) Assessment: Improved  His updated medication list for this problem includes:    Maxzide-25 37.5-25 Mg Tabs (Triamterene-hctz) .[name] Kitchen... Take 1 tablet by mouth once a day  Orders: T-Basic Metabolic Panel [phone])  BP today: 120/80 Prior BP: 130/80 (11/15/2008)  Labs Reviewed: K+: 4.4 (01/09/2009) Creat: :  0.93 (01/09/2009)   Chol: 237 (01/09/2009)   HDL: 66 (01/09/2009)   LDL: 161 (01/09/2009)   TG: 49 (01/09/2009)  Problem # 6:  HYPERLIPIDEMIA (ICD-272.4) Assessment: Deteriorated  His updated medication list for this problem includes:    Simvastatin 80 Mg Tabs (Simvastatin) .[name] Kitchen... Take 1 tab by mouth at bedtime  Orders: T-Lipid Profile [phone]) T-Hepatic Function [phone])  Labs Reviewed: SGOT: 14 (01/09/2009)   SGPT: 16 (01/09/2009)   HDL:66 (01/09/2009), 58 (09/14/2008)  LDL:161 (01/09/2009), 82 (62/95/2841)  Chol:237 (01/09/2009), 148 (09/14/2008)  Trig:49 (01/09/2009), 39 (09/14/2008)  Complete Medication List: 1)  Simvastatin 80 Mg Tabs (Simvastatin) .... Take 1 tab by mouth at bedtime 2)  Maxzide-25 37.5-25 Mg Tabs (Triamterene-hctz) .... Take 1 tablet by mouth once a day 3)  Duoneb 0.5-2.5 (3) Mg/16ml Soln (Ipratropium-albuterol) .... One inhalation 3 times daily as needed for severe wheezing 4)  Ciprofloxacin Hcl 500 Mg Tabs (Ciprofloxacin hcl) .... Take 1 tablet by mouth two times a day  Patient Instructions: 1)  Please schedule a follow-up appointment in 3 months. 2)  You will be referred to a specialist about your prosate, pls go. 3)  Stop fluoxetine 4)  Tobacco is very bad for your health and your loved ones! You Should stop smoking!. 5)  Stop Smoking Tips: Choose a Quit date. Cut down before the Quit date. decide what you will do as a substitute when you feel the urge to smoke(gum,toothpick,exercise). Prescriptions: CIPROFLOXACIN HCL 500 MG TABS (CIPROFLOXACIN HCL) Take 1 tablet by mouth two times a day  #42 x 0   Entered and Authorized by:   Syliva Overman MD   Signed by:   Syliva Overman MD on 01/25/2009   Method used:   [facility] to        [facility] [phone]* (retail)       [address]       [place], [place]  [address]       Ph: [phone] or [phone]       Fax: [phone]   RxID:   [id number]

## 2010-02-06 NOTE — Assessment & Plan Note (Signed)
Summary: sick- room 3   Vital Signs:  Patient profile:   60 year old male Height:      69 inches Weight:      127 pounds BMI:     18.82 O2 Sat:      96 % on Room air Temp:     98.6 degrees F oral Pulse rate:   148 / minute Resp:     16 per minute BP sitting:   140 / 80  (left arm)  Vitals Entered By: Adella Hare LPN (March 14, 1608 3:14 PM)  Serial Vital Signs/Assessments:  Time      Position  BP       Pulse  Resp  Temp     By                              128                   Esperanza Sheets PA                              [address] [place]  Comments: after albuterol NMT By: Esperanza Sheets PA   CC: cough, headache, hot flashes Is Patient Diabetic? No Pain Assessment Patient in pain? no        CC:  cough, headache, and hot flashes.  History of Present Illness: Pt is here today with c/o cough, chest congestion & wheezing since last night.  His cough is productive though he doesn't know what color the phlegm is. He has a frontal HA today.  No sinus congestion or nasal drainage.  He has been using Primatene Mist inhaler without improvement.  No fever or chills.  Pt has a hx of COPD.  He is still smoking.   Current Medications (verified): 1)  Simvastatin 80 Mg Tabs (Simvastatin) .... Take 1 Tab By Mouth At Bedtime 2)  Maxzide-25 37.5-25 Mg Tabs (Triamterene-Hctz) .... Take 1 Tablet By Mouth Once A Day 3)  Duoneb 0.5-2.5 (3) Mg/58ml Soln (Ipratropium-Albuterol) .... One Inhalation 3 Times Daily As Needed For Severe Wheezing 4)  Ciprofloxacin Hcl 500 Mg Tabs (Ciprofloxacin Hcl) .... Take 1 Tablet By Mouth Two Times A Day  Allergies (verified): No Known Drug Allergies  Past History:  Past medical, surgical, family and social histories (including risk factors) reviewed for relevance to current acute and chronic problems.  Past Medical History: Reviewed history from 08/31/2007 and no changes required. Current Problems:  NICOTINE ADDICTION  (ICD-305.1) COPD (ICD-496) DEPRESSION (ICD-311) HYPERLIPIDEMIA (ICD-272.4) HYPERTENSION (ICD-401.9) CVA with temporary viion loss in 2008  Past Surgical History: Reviewed history from 01/05/2007 and no changes required. none  Family History: Reviewed history from 08/31/2007 and no changes required. Mother deceased - cause unknown Father deceased 68 - cause unknown One sister living - lung disease Brothers x 2 living , health status unknown  Social History: Reviewed history from 08/31/2007 and no changes required. Unemploed Widower One son Current Smoker Alcohol use-yes Drug use-no  Review of Systems General:  Denies chills and fever. ENT:  Denies earache, nasal congestion, postnasal drainage, sinus pressure, and sore throat. CV:  Denies chest pain or discomfort and palpitations. Resp:  Complains of cough, sputum productive, and wheezing; denies shortness of breath. GI:  Denies nausea  and vomiting. Heme:  Denies enlarge lymph nodes.  Physical Exam  General:  Well-developed,well-nourished,in no acute distress; alert,appropriate and cooperative throughout examination Head:  Normocephalic and atraumatic without obvious abnormalities. No apparent alopecia or balding. Ears:  External ear exam shows no significant lesions or deformities.  Otoscopic examination reveals clear canals, tympanic membranes are intact bilaterally without bulging, retraction, inflammation or discharge. Hearing is grossly normal bilaterally. Nose:  External nasal examination shows no deformity or inflammation. Nasal mucosa are pink and moist without lesions or exudates.no sinus percussion tenderness.   Mouth:  pharynx pink and moist, no erythema, no exudates, and teeth missing.   Neck:  No deformities, masses, or tenderness noted. Lungs:  normal respiratory effort.  distant BS bilat with exp wheeze noted.   after NMT BS still distant, good air exchange, no wheeze, rales or rhonci Heart:  Normal rate and  regular rhythm. S1 and S2 normal without gallop, murmur, click, rub or other extra sounds. Cervical Nodes:  No lymphadenopathy noted Psych:  Cognition and judgment appear intact. Alert and cooperative with normal attention span and concentration. No apparent delusions, illusions, hallucinations   Impression & Recommendations:  Problem # 1:  ACUTE BRONCHITIS (ICD-466.0) Assessment New  His updated medication list for this problem includes:    Duoneb 0.5-2.5 (3) Mg/9ml Soln (Ipratropium-albuterol) ..... One inhalation 3 times daily as needed for severe wheezing    Ciprofloxacin Hcl 500 Mg Tabs (Ciprofloxacin hcl) .[name] Kitchen... Take 1 tablet by mouth two times a day    Doxycycline Hyclate 100 Mg Caps (Doxycycline hyclate) .[name] Kitchen... 1 two times a day pc x 10 days  Orders: Nebulizer Tx (69629) Depo- Medrol 80mg  (J1040) Admin of Therapeutic Inj  intramuscular or subcutaneous (52841) Albuterol Sulfate Sol 1mg  unit dose (L2440)  Problem # 2:  CHRONIC OBSTRUCTIVE PULMONARY DISEASE, ACUTE EXACERBATION (ICD-491.21) Assessment: New Discussed with pt that he should d/c using Primatene Mist inhaler.  That he should be using his Duoneb treatments. Encouraged pt to d/c smoking.  Orders: Nebulizer Tx (10272)  Problem # 3:  HYPERTENSION (ICD-401.9)  His updated medication list for this problem includes:    Maxzide-25 37.5-25 Mg Tabs (Triamterene-hctz) .[name] Kitchen... Take 1 tablet by mouth once a day  BP today: 140/80 Prior BP: 120/80 (01/25/2009)  Labs Reviewed: K+: 4.4 (01/09/2009) Creat: : 0.93 (01/09/2009)   Chol: 237 (01/09/2009)   HDL: 66 (01/09/2009)   LDL: 161 (01/09/2009)   TG: 49 (01/09/2009)  Complete Medication List: 1)  Simvastatin 80 Mg Tabs (Simvastatin) .... Take 1 tab by mouth at bedtime 2)  Maxzide-25 37.5-25 Mg Tabs (Triamterene-hctz) .... Take 1 tablet by mouth once a day 3)  Duoneb 0.5-2.5 (3) Mg/57ml Soln (Ipratropium-albuterol) .... One inhalation 3 times daily as needed for severe  wheezing 4)  Ciprofloxacin Hcl 500 Mg Tabs (Ciprofloxacin hcl) .... Take 1 tablet by mouth two times a day 5)  Doxycycline Hyclate 100 Mg Caps (Doxycycline hyclate) .[name] Kitchen.. 1 two times a day pc x 10 days  Patient Instructions: 1)  Please schedule a follow-up appointment in 2 weeks.  sooner if you worsen or don't improve. 2)  Tobacco is very bad for your health and your loved ones! You Should stop smoking!. 3)  Stop Smoking Tips: Choose a Quit date. Cut down before the Quit date. decide what you will do as a substitute when you feel the urge to smoke(gum,toothpick,exercise). 4)  Use the presciption medication for breathing treatments.  Stop using the Primatene Mist inhaler. Prescriptions: DUONEB 0.5-2.5 (3) MG/3ML SOLN (  IPRATROPIUM-ALBUTEROL) one inhalation 3 times daily as needed for severe wheezing  #60 x 2   Entered by:   Adella Hare LPN   Authorized by:   Esperanza Sheets PA   Signed by:   Adella Hare LPN on 04/54/0981   Method used:   Electronically to        [facility]* (retail)       [address] [place], [place]  [address]       Ph: [phone]       Fax: [phone]   RxID:   [id number] DOXYCYCLINE HYCLATE 100 MG CAPS (DOXYCYCLINE HYCLATE) 1 two times a day pc x 10 days  #20 x 0   Entered and Authorized by:   Esperanza Sheets PA   Signed by:   Esperanza Sheets PA on 03/14/2009   Method used:   Electronically to        [facility]* (retail)       [address]       [place], [place]  [address]       Ph: [phone]       Fax: [phone]   RxID:   [id number] DUONEB 0.5-2.5 (3) MG/3ML SOLN (IPRATROPIUM-ALBUTEROL) one inhalation 3 times daily as needed for severe wheezing  #60 x 2   Entered and Authorized by:   Esperanza Sheets PA   Signed by:   Esperanza Sheets PA on 03/14/2009   Method used:   [facility]ronically to        [facility] [phone]* (retail)       [address]       [place], [place]  [address]       Ph: [phone] or [phone]       Fax: [phone]   RxID:   [id number]    Medication Administration  Injection # 1:    Medication: Depo- Medrol 80mg     Diagnosis: ACUTE BRONCHITIS (ICD-466.0)    Route: IM    Site: RUOQ gluteus    Exp Date: 11/11    Lot #: [id number]    Mfr: Pharmacia    Patient tolerated injection without complications    Given by: Adella Hare LPN (March 15, 9379 4:05 PM)  Medication # 1:    Medication: Albuterol Sulfate Sol 1mg  unit dose    Diagnosis: ACUTE BRONCHITIS (ICD-466.0)    Dose: 2.5mg     Route: inhaled    Exp Date: 8/11    Lot #: [id number]    Mfr: nephron pharm    Patient tolerated medication without complications    Given by: Adella Hare LPN (March 15, 1694 4:05 PM)  Orders Added: 1)  Nebulizer Tx [78938] 2)  Depo- Medrol 80mg  [J1040] 3)  Est. Patient Level IV [10175] 4)  Admin of Therapeutic Inj  intramuscular or subcutaneous [96372] 5)  Albuterol Sulfate Sol 1mg  unit dose [Z0258]

## 2010-02-06 NOTE — Assessment & Plan Note (Signed)
Summary: office visit   Vital Signs:  Patient profile:   60 year old male Height:      69 inches Weight:      121.75 pounds BMI:     18.04 O2 Sat:      93 % Pulse rate:   67 / minute Pulse rhythm:   regular Resp:     16 per minute BP sitting:   114 / 80  (left arm) Cuff size:   regular  Vitals Entered By: Everitt Amber LPN (June 28, 2009 9:40 AM) CC: Follow up chronic problems   CC:  Follow up chronic problems.  History of Present Illness: Reports  that he hasbeen doing well. Denies recent fever or chills. Denies sinus pressure, nasal congestion , ear pain or sore throat. Denies chest congestion, or cough productive of sputum. Denies chest pain, palpitations, PND, orthopnea or leg swelling. Denies abdominal pain, nausea, vomitting, diarrhea or constipation. Denies change in bowel movements or bloody stool. Denies dysuria , frequency, incontinence or hesitancy. Denies  joint pain, swelling, or reduced mobility. Denies headaches, vertigo, seizures. Denies depression, anxiety or insomnia. Denies  rash, lesions, or itch.     Current Medications (verified): 1)  Simvastatin 80 Mg Tabs (Simvastatin) .... Take 1 Tab By Mouth At Bedtime 2)  Maxzide-25 37.5-25 Mg Tabs (Triamterene-Hctz) .... Take 1 Tablet By Mouth Once A Day 3)  Duoneb 0.5-2.5 (3) Mg/85ml Soln (Ipratropium-Albuterol) .... One Inhalation 3 Times Daily As Needed For Severe Wheezing  Allergies (verified): No Known Drug Allergies  Review of Systems      See HPI Eyes:  Complains of vision loss-both eyes. Endo:  Denies cold intolerance, excessive hunger, excessive thirst, excessive urination, heat intolerance, polyuria, and weight change. Heme:  Denies abnormal bruising and bleeding. Allergy:  Denies hives or rash and itching eyes.  Physical Exam  General:  Well-developed,adequately -nourished,in no acute distress; alert,appropriate and cooperative throughout examination HEENT: No facial asymmetry,  EOMI, No  sinus tenderness, TM's Clear, oropharynx  pink and moist.   Chest: decreased air entry bilaterally CVS: S1, S2, No murmurs, No S3.   Abd: Soft, Nontender.  MS: Adequate ROM spine, hips, shoulders and knees.  Ext: No edema.   CNS: CN 2-12 intact, power tone and sensation normal throughout.   Skin: Intact, no visible lesions or rashes.  Psych: Good eye contact, normal affect.  Memory intact, not anxious or depressed appearing.    Impression & Recommendations:  Problem # 1:  VITAMIN D DEFICIENCY (ICD-268.9) Assessment Comment Only  Orders: T-Vitamin D (25-Hydroxy) (98119-14782)  Problem # 2:  IMPAIRED FASTING GLUCOSE (ICD-790.21) Assessment: Comment Only  Orders: T- Hemoglobin A1C (95621-30865)  Labs Reviewed: Creat: 1.01 (06/26/2009)     Problem # 3:  ASTHMA (ICD-493.90) Assessment: Improved  His updated medication list for this problem includes:    Duoneb 0.5-2.5 (3) Mg/80ml Soln (Ipratropium-albuterol) ..... One inhalation 3 times daily as needed for severe wheezing  Problem # 4:  NICOTINE ADDICTION (ICD-305.1) Assessment: Unchanged  Encouraged smoking cessation and discussed different methods for smoking cessation.   Problem # 5:  HYPERTENSION (ICD-401.9) Assessment: Unchanged  His updated medication list for this problem includes:    Maxzide-25 37.5-25 Mg Tabs (Triamterene-hctz) .[name] Kitchen... Take 1 tablet by mouth once a day  BP today: 114/80 Prior BP: 130/80 (03/28/2009)  Labs Reviewed: K+: 3.9 (06/26/2009) Creat: : 1.01 (06/26/2009)   Chol: 151 (06/26/2009)   HDL: 51 (06/26/2009)   LDL: 90 (06/26/2009)   TG: 50 (06/26/2009)  Problem #  6:  HYPERLIPIDEMIA (ICD-272.4) Assessment: Improved  The following medications were removed from the medication list:    Simvastatin 80 Mg Tabs (Simvastatin) .[name] Kitchen... Take 1 tab by mouth at bedtime His updated medication list for this problem includes:    Simvastatin 40 Mg Tabs (Simvastatin) .[name] Kitchen... Take 1 tab by mouth at  bedtime  Orders: T-Hepatic Function [id number]) T-Lipid Profile [id number])  Labs Reviewed: SGOT: 14 (01/09/2009)   SGPT: 16 (01/09/2009)   HDL:51 (06/26/2009), 66 (01/09/2009)  LDL:90 (06/26/2009), 161 (51/76/1607)  Chol:151 (06/26/2009), 237 (01/09/2009)  Trig:50 (06/26/2009), 49 (01/09/2009)  Complete Medication List: 1)  Maxzide-25 37.5-25 Mg Tabs (Triamterene-hctz) .... Take 1 tablet by mouth once a day 2)  Duoneb 0.5-2.5 (3) Mg/14ml Soln (Ipratropium-albuterol) .... One inhalation 3 times daily as needed for severe wheezing 3)  Simvastatin 40 Mg Tabs (Simvastatin) .... Take 1 tab by mouth at bedtime  Patient Instructions: 1)  Please schedule a follow-up appointment in 4 months. 2)  pLs cut back on regular sodas since you may become diabetic, and eat regularly breakfast and lunc/supper 3)  new dose of your cholesterol when you finish the current meds that you have 4)  Hepatic Panel prior to visit, ICD-9: 5)  Lipid Panel prior to visit, ICD-9:   fasting in 4 months 6)  HbgA1C prior to visit, ICD-9: 7)  vitamin D Prescriptions: SIMVASTATIN 40 MG TABS (SIMVASTATIN) Take 1 tab by mouth at bedtime  #30 x 3   Entered and Authorized by:   Syliva Overman MD   Signed by:   Syliva Overman MD on 06/28/2009   Method used:   Printed then faxed to ...       [facility]* (retail)       [address]       [place], [place]  [address]       Ph: [phone]       Fax: [phone]   RxID:   [id number]

## 2010-02-06 NOTE — Assessment & Plan Note (Signed)
Summary: sick- room 2   Vital Signs:  Patient profile:   60 year old male Height:      69 inches Weight:      129 pounds BMI:     19.12 O2 Sat:      96 % on Room air Temp:     101.9 degrees F oral Pulse rate:   119 / minute Resp:     16 per minute BP sitting:   150 / 84  (left arm)  Vitals Entered By: Adella Hare LPN (March 23, 2009 3:31 PM) CC: chills and vomitting Is Patient Diabetic? No Pain Assessment Patient in pain? no        CC:  chills and vomitting.  History of Present Illness: Pt is here today with c/o nausea all day.  Vomited 1 x .  No diarrhea.  Hot & cold.  Last urinated last night sometime in the middle of the night.  Has not voided all day, & doesn't have the urge to.  Also feeling lightheaded when stands up.  No chest pain, or palp.    Current Medications (verified): 1)  Simvastatin 80 Mg Tabs (Simvastatin) .... Take 1 Tab By Mouth At Bedtime 2)  Maxzide-25 37.5-25 Mg Tabs (Triamterene-Hctz) .... Take 1 Tablet By Mouth Once A Day 3)  Duoneb 0.5-2.5 (3) Mg/77ml Soln (Ipratropium-Albuterol) .... One Inhalation 3 Times Daily As Needed For Severe Wheezing 4)  Ciprofloxacin Hcl 500 Mg Tabs (Ciprofloxacin Hcl) .... Take 1 Tablet By Mouth Two Times A Day 5)  Doxycycline Hyclate 100 Mg Caps (Doxycycline Hyclate) .[name] Kitchen.. 1 Two Times A Day Pc X 10 Days  Allergies (verified): No Known Drug Allergies  Past History:  Past medical history reviewed for relevance to current acute and chronic problems.  Past Medical History: Reviewed history from 08/31/2007 and no changes required. Current Problems:  NICOTINE ADDICTION (ICD-305.1) COPD (ICD-496) DEPRESSION (ICD-311) HYPERLIPIDEMIA (ICD-272.4) HYPERTENSION (ICD-401.9) CVA with temporary viion loss in 2008  Review of Systems General:  Complains of chills and weakness. ENT:  Denies earache, nasal congestion, and sore throat. CV:  Denies chest pain or discomfort. Resp:  Denies cough. GI:  Complains of nausea and  vomiting; denies abdominal pain and diarrhea.  Physical Exam  General:  alert, cooperative to examination, and cachetic.   Head:  Normocephalic and atraumatic without obvious abnormalities. No apparent alopecia or balding. Ears:  External ear exam shows no significant lesions or deformities.  Otoscopic examination reveals clear canals, tympanic membranes are intact bilaterally without bulging, retraction, inflammation or discharge. Hearing is grossly normal bilaterally. Nose:  External nasal examination shows no deformity or inflammation. Nasal mucosa are pink and moist without lesions or exudates. Mouth:  Oral mucosa and oropharynx without lesions or exudates. Tongue is moist. Neck:  No deformities, masses, or tenderness noted. Lungs:  Normal respiratory effort, chest expands symmetrically. Lungs are clear to auscultation, no crackles or wheezes. Heart:  no murmur and tachycardia.   Abdomen:  Bowel sounds positive,abdomen soft and non-tender without masses, organomegaly or hernias noted. Skin:  warm to touch, dry.turgor normal.   Cervical Nodes:  No lymphadenopathy noted Psych:  Oriented X3 and good eye contact.     Impression & Recommendations:  Problem # 1:  DEHYDRATION (ICD-276.51) Assessment New Pt escorted across the street to ER for rehydration & further evaluation.  Problem # 2:  NAUSEA AND VOMITING (ICD-787.01) Assessment: New  Complete Medication List: 1)  Simvastatin 80 Mg Tabs (Simvastatin) .... Take 1 tab by mouth at  bedtime 2)  Maxzide-25 37.5-25 Mg Tabs (Triamterene-hctz) .... Take 1 tablet by mouth once a day 3)  Duoneb 0.5-2.5 (3) Mg/34ml Soln (Ipratropium-albuterol) .... One inhalation 3 times daily as needed for severe wheezing 4)  Ciprofloxacin Hcl 500 Mg Tabs (Ciprofloxacin hcl) .... Take 1 tablet by mouth two times a day 5)  Doxycycline Hyclate 100 Mg Caps (Doxycycline hyclate) .[name] Kitchen.. 1 two times a day pc x 10 days  Patient Instructions: 1)  Please schedule a  follow-up appointment as needed. 2)  To go to ER for IV fluids and further evaluation.

## 2010-02-06 NOTE — Miscellaneous (Signed)
Summary: sample  proair sample given [id number] 11/12

## 2010-02-06 NOTE — Letter (Signed)
Summary: dr. Dennie Maizes  dr. Dennie Maizes   Imported By: Lind Guest 12/12/2009 09:23:00  _____________________________________________________________________  External Attachment:    Type:   Image     Comment:   External Document

## 2010-02-07 LAB — GLUCOSE, CAPILLARY
Glucose-Capillary: 137 mg/dL — ABNORMAL HIGH (ref 70–99)
Glucose-Capillary: 131 mg/dL — ABNORMAL HIGH (ref 70–99)

## 2010-02-08 LAB — DIFFERENTIAL
Eosinophils Relative: 0 % (ref 0–5)
Lymphocytes Relative: 3 % — ABNORMAL LOW (ref 12–46)
Lymphs Abs: 0.5 10*3/uL — ABNORMAL LOW (ref 0.7–4.0)
Monocytes Absolute: 0.8 10*3/uL (ref 0.1–1.0)
Monocytes Relative: 5 % (ref 3–12)

## 2010-02-08 LAB — BASIC METABOLIC PANEL
BUN: 15 mg/dL (ref 6–23)
Chloride: 102 mEq/L (ref 96–112)
GFR calc non Af Amer: >60 mL/min (ref >60)
Glucose, Bld: 163 mg/dL — ABNORMAL HIGH (ref 70–99)
Potassium: 4 mEq/L (ref 3.5–5.1)

## 2010-02-08 LAB — CBC
HCT: 36.9 % — ABNORMAL LOW (ref 39.0–52.0)
MCH: 31 pg (ref 26.0–34.0)
MCV: 90.7 fL (ref 78.0–100.0)
RDW: 13.3 % (ref 11.5–15.5)
WBC: 18.6 10*3/uL — ABNORMAL HIGH (ref 4.0–10.5)

## 2010-02-08 LAB — GLUCOSE, CAPILLARY: Glucose-Capillary: 133 mg/dL — ABNORMAL HIGH (ref 70–99)

## 2010-02-08 NOTE — Letter (Signed)
Summary: dr. Dennie Maizes  dr. Dennie Maizes   Imported By: Lind Guest 01/17/2010 10:53:55  _____________________________________________________________________  External Attachment:    Type:   Image     Comment:   External Document

## 2010-02-08 NOTE — Assessment & Plan Note (Signed)
Summary: F UP   Vital Signs:  Patient profile:   60 year old male Height:      69 inches Weight:      118.25 pounds BMI:     17.53 O2 Sat:      98 % Pulse rate:   101 / minute Pulse rhythm:   regular Resp:     16 per minute BP sitting:   120 / 82  (left arm)  Vitals Entered By: Everitt Amber LPN (January 15, 2010 9:26 AM) CC: Follow up chronic problems, has been having some wheezing off and on during the day   CC:  Follow up chronic problems and has been having some wheezing off and on during the day.  History of Present Illness: Pt in for hosp f/u for respiratory failyure frrom asthma requiring intubation. The pt's living conditions are not good, states he is trying to move to an apt as unable to manage his house,no help from his son, who he states also uses up his meds. Still experiencing cough, dyspnea and wheezing, denies fever, chills or sputum. denies sinus pressure, nasal drainage or sore throat.    Preventive Screening-Counseling & Management  Alcohol-Tobacco     Smoking Cessation Counseling: yes  Current Medications (verified): 1)  Maxzide-25 37.5-25 Mg Tabs (Triamterene-Hctz) .... Take 1 Tablet By Mouth Once A Day 2)  Simvastatin 40 Mg Tabs (Simvastatin) .... Take 1 Tab By Mouth At Bedtime 3)  Proair Hfa 108 (90 Base) Mcg/act Aers (Albuterol Sulfate) .... Two Puffs Every 8 Hours As Needed 4)  Advair Diskus 250-50 Mcg/dose Aepb (Fluticasone-Salmeterol) .... One Puff Daily  Allergies (verified): No Known Drug Allergies  Review of Systems      See HPI General:  Complains of fatigue. Eyes:  Denies blurring and discharge. CV:  Denies chest pain or discomfort, palpitations, and swelling of feet. Resp:  Complains of cough, shortness of breath, and wheezing; denies sputum productive. GI:  Denies abdominal pain, constipation, diarrhea, nausea, and vomiting. GU:  Denies dysuria and urinary frequency. MS:  Denies joint pain and stiffness. Psych:  Complains of anxiety and  depression; denies suicidal thoughts/plans, thoughts of violence, unusual visions or sounds, and thoughts /plans of harming others; poor psychsocial conditions, drinks alcohol, non compliant with antidepressant.  Physical Exam  General:  Under l-nourished,chronically ill appearing male in no acute distress; alert,appropriate and cooperative throughout examination HEENT: No facial asymmetry,  EOMI, No sinus tenderness, TM's Clear, oropharynx  pink and moist. Chest: Significantly decreased air entry throughout CVS: S1, S2, No murmurs, No S3.   Abd: Soft, Nontender.  MS: Adequate ROM spine, hips, shoulders and knees.  Ext: No edema.   CNS: CN 2-12 intact, power tone and sensation normal throughout.   Skin: Intact, no visible lesions or rashes.  Psych: Good eye contact, normal affect.  Memory intact, not anxious or depressed appearing.    Impression & Recommendations:  Problem # 1:  ASTHMA (ICD-493.90) Assessment Deteriorated  His updated medication list for this problem includes:    Proair Hfa 108 (90 Base) Mcg/act Aers (Albuterol sulfate) .[name] Kitchen..[name] Kitchen Two puffs every 8 hours as needed    Advair Diskus 250-50 Mcg/dose Aepb (Fluticasone-salmeterol) ..... One puff daily    Albuterol Sulfate (2.5 Mg/54ml) 0.083% Nebu (Albuterol sulfate) ..... One neb every 6 to 8 hours as needed for wheezing    Ipratropium Bromide 0.02 % Soln (Ipratropium bromide) ..... One inhalationion every 6 to 8 hours as needed for wheezing, pls mix with albuterol solution  before use  Orders: Medicare Electronic Prescription [phone])  Problem # 2:  HYPERTENSION (ICD-401.9) Assessment: Unchanged  His updated medication list for this problem includes:    Maxzide-25 37.5-25 Mg Tabs (Triamterene-hctz) .[name] Kitchen... Take 1 tablet by mouth once a day  BP today: 120/82 Prior BP: 124/80 (10/26/2009)  Labs Reviewed: K+: 3.9 (10/26/2009) Creat: : 0.99 (10/26/2009)   Chol: 151 (06/26/2009)   HDL: 51 (06/26/2009)   LDL: 90 (06/26/2009)    TG: 50 (06/26/2009)  Problem # 3:  CHRONIC OBSTRUCTIVE PULMONARY DISEASE, ACUTE EXACERBATION (ICD-491.21) Assessment: Comment Only  Problem # 4:  NICOTINE ADDICTION (ICD-305.1) Assessment: Unchanged  Encouraged smoking cessation and discussed different methods for smoking cessation.   Complete Medication List: 1)  Maxzide-25 37.5-25 Mg Tabs (Triamterene-hctz) .... Take 1 tablet by mouth once a day 2)  Simvastatin 40 Mg Tabs (Simvastatin) .... Take 1 tab by mouth at bedtime 3)  Proair Hfa 108 (90 Base) Mcg/act Aers (Albuterol sulfate) .... Two puffs every 8 hours as needed 4)  Advair Diskus 250-50 Mcg/dose Aepb (Fluticasone-salmeterol) .... One puff daily 5)  Albuterol Sulfate (2.5 Mg/52ml) 0.083% Nebu (Albuterol sulfate) .... One neb every 6 to 8 hours as needed for wheezing 6)  Ipratropium Bromide 0.02 % Soln (Ipratropium bromide) .... One inhalationion every 6 to 8 hours as needed for wheezing, pls mix with albuterol solution  before use  Patient Instructions: 1)  Please schedule a follow-up appointment in 3 months. 2)  Tobacco is very bad for your health and your loved ones! You Should stop smoking!. 3)  Stop Smoking Tips: Choose a Quit date. Cut down before the Quit date. decide what you will do as a substitute when you feel the urge to smoke(gum,toothpick,exercise). 4)  meds are sent in for your breATHING, PLS USE TOGETHTER AS DIRECTED Prescriptions: IPRATROPIUM BROMIDE 0.02 % SOLN (IPRATROPIUM BROMIDE) one inhalationion every 6 to 8 hours as needed for wheezing, pls mix with albuterol solution  before use  #120 x 3   Entered and Authorized by:   Syliva Overman MD   Signed by:   Syliva Overman MD on 01/15/2010   Method used:   Electronically to        [facility]* (retail)       [address] [place], [place]  [address]       Ph: [phone]       Fax: [phone]   RxID:   [id number] ALBUTEROL SULFATE (2.5 MG/3ML) 0.083% NEBU  (ALBUTEROL SULFATE) one neb every 6 to 8 hours as needed for wheezing  #120 x 3   Entered and Authorized by:   Syliva Overman MD   Signed by:   Syliva Overman MD on 01/15/2010   Method used:   Electronically to        [facility] (retail)       [address]       [place], [place]  [address]       Ph: [phone]       Fax: [phone]   RxID:   [id number]    Orders Added: 1)  Est. Patient Level IV [60109] 2)  Medicare Electronic Prescription [phone]

## 2010-02-08 NOTE — Letter (Signed)
[facility]   Imported By: Lind Guest 12/20/2009 10:20:52  _____________________________________________________________________  External Attachment:    Type:   Image     Comment:   External Document

## 2010-02-10 NOTE — Discharge Summary (Signed)
[name], [name]                ACCOUNT NO.:  [id number]  MEDICAL RECORD NO.:  [id number]           PATIENT TYPE:  I  LOCATION:  A339                          FACILITY:  [facility]  PHYSICIAN:  Elliot Cousin, M.D.    DATE OF BIRTH:  August 02, 1950  DATE OF ADMISSION:  02/04/2010 DATE OF DISCHARGE:  02/02/2012LH                              DISCHARGE SUMMARY   DISCHARGE DIAGNOSES: 1. Chronic obstructive pulmonary disease with acute bronchitic     exacerbation. 2. Respiratory failure secondary to chronic obstructive pulmonary     disease exacerbation.  The patient did not require intubation. 3. Ongoing tobacco and marijuana use. 4. Low TSH.  Free T4 and free T3 results were pending at the time of     hospital discharge. 5. Leukocytosis, likely secondary to steroids. 6. Nodular density seen in the apical wall of the left ventricle,     possibly representing a thrombus or mass.  Further evaluation was     deferred to outpatient management by cardiologist, Dr. Dietrich Pates. 7. Tachycardia secondary to respiratory distress.  DISCHARGE MEDICATIONS: 1. Avelox 400 mg daily for three more days. 2. Prednisone taper 10 mg tablets to be tapered as directed over the     next 5 days. 3. Triamterene/HCTZ 37.5/25 mg, the dose was decreased to half a     tablet daily. 4. Albuterol inhaler 2 puffs every 6 hours as needed for shortness of     breath. 5. Advair Diskus 250/50 one puff b.i.d. 6. Simvastatin 40 mg at bedtime.  DISCHARGE DISPOSITION:  The patient was discharged to home in improved and stable condition on February 08, 2010.  He will follow up with Dr. Dietrich Pates on February 23, 2010, at 1 o'clock p.m..  He will follow up with Dr. Lodema Hong as scheduled in April of 2012, or sooner if needed.  CONSULTATION:  Gerrit Friends. Dietrich Pates, MD, [facility]  PROCEDURE PERFORMED:  Chest x-ray on February 05, 2010.  Results revealed emphysematous and bronchitic changes.  No acute abnormality.  HISTORY OF PRESENT  ILLNESS:  The patient is a 60 year old man with a past medical history significant for recent ventilator-dependent respiratory failure secondary to pneumonia and asthmatic bronchitis in December of 2011, who presented to the emergency department on February 06, 2010, with a chief complaint of shortness of breath.  When he was initially evaluated in the emergency department, he was noted to be tachycardic, tachypneic, and with labored breathing.  He was placed on BiPAP.  He was given Ativan and 125 mg of Solu-Medrol.  His initial ABG on BiPAP revealed a pH of 7.3, pCO2 of 52.2, and pO2 of 193.  His chest x-ray revealed hyperaeration, but no consolidation.  He was afebrile.  His EKG revealed sinus tachycardia with a heart rate of 160 beats per minute.  He was admitted for further evaluation and management.  HOSPITAL COURSE:  As stated above, the patient was started on BiPAP, Solu-Medrol, and bronchodilators.  Following admission, the BiPAP was discontinued and he was supported with oxygen alone.  He was started empirically on intravenous Solu-Medrol, albuterol and Atrovent nebulizers, and Avelox.  He admitted  to tobacco and marijuana use.  He was strongly advised to stop both.  A followup ABG the next day on 3 liters of oxygen revealed a pH of 7.39, pCO2 of 46, and pO2 of 76.  On steroids, the patient's white blood cell count increased from 9.1-17.1. Another CBC is pending prior to discharge.  The his heart rate normalized.  He had no complaints of chest pain during the hospital course.  Cardiac markers ordered in the emergency department were essentially negative.  Cardiologist, Dr. Dietrich Pates was consulted to assess the previously known nodular density or mass in the left apex.  Per Dr. Marvel Plan assessment, the area in question was likely a TV papillary muscle head. No further attention was required, but he wanted to see the patient back in his office in 2 weeks.  He will decide on  additional testing if necessary.  The patient's TSH was assessed and it was found to be low at 0.166. Free T4 and free T3 were ordered, however, the results were pending at the time of hospital discharge.  Other lab studies have been ordered as well and all results are pending currently.  The patient was informed that the dictating physician would call him with the results if any were worrisome over the next 24-48 hours following hospital discharge.  The patient voiced understanding.  The patient ambulated in the hallway yesterday on room air.  His oxygen saturation was 94%.  He is currently oxygenating 94-96% on room air at rest.  He was again advised to stop smoking altogether.     Elliot Cousin, M.D.     DF/MEDQ  D:  02/08/2010  T:  02/09/2010  Job:  [id number]  cc:   Milus Mallick. Lodema Hong, M.D. Fax: [phone]  Gerrit Friends. Dietrich Pates, MD, [facility] [address] [place], [place] [address]  Electronically Signed by Elliot Cousin M.D. on 02/10/2010 06:25:41 PM

## 2010-02-12 NOTE — Consult Note (Addendum)
[name], [name]                ACCOUNT NO.:  [id number]  MEDICAL RECORD NO.:  [id number]          PATIENT TYPE:  INP  LOCATION:  A339                          FACILITY:  [facility]  PHYSICIAN:  Ian Friends. Dietrich Pates, MD, FACCDATE OF BIRTH:  29-Jan-1950  DATE OF CONSULTATION:  02/06/2010 DATE OF DISCHARGE:                                CONSULTATION   PRIMARY CARDIOLOGIST:  Ian Friends. Dietrich Pates, MD, [facility], (this is new).  PRIMARY CARE PHYSICIAN:  Dr. Lodema Hong.  REQUESTING PHYSICIAN:  [facility].  REASON FOR CONSULTATION:  Abnormal echo with questionable thrombus versus mass in the apex.  HISTORY OF PRESENT ILLNESS:  This is a 60 year old African American male admitted with labored breathing and tachycardia with recent admission in December of 2011, with ventilator-dependent respiratory failure in the setting of community-acquired pneumonia and asthma with known history of asthma and polysubstance abuse.  He has had no prior cardiac workup. The patient had an echocardiogram, dated February 05, 2010, which demonstrated a nodular density likely representing thrombus or mass in the apex.  As a result of this, we were asked for further recommendations and need for TEE versus other cardiac workup.  As stated above, the patient has never been seen by cardiologist in the past or had any prior cardiac evaluation.  REVIEW OF SYSTEMS:  Shortness of breath, dyspnea on exertion, cough, and wheezing.  All other systems reviewed and found to be negative unless listed above.  CODE STATUS:  Full code.  PAST MEDICAL HISTORY: 1. Recurrent admissions for respiratory failure. 2. Polysubstance abuse. 3. Asthma. 4. Community-acquired pneumonia in December of 2011. 5. History of CVA in 2008. 6. History of GI bleed with EGD in 2005, revealing gastric ulcers. 7. PFTs in February of 2006, revealing severe ventilatory defect with     evidence of air flow obstruction. 8. Echocardiogram  dated February 05, 2010, revealing nodular density in     the apical wall likely representing thrombus or mass with an EF of     70%.  PAST SURGICAL HISTORY:  None.  SOCIAL HISTORY:  He lives in [place] alone.  He is widowed.  He is a 50-pack-year smoker.  He is a former EtOH abuse in the past and former THC and cocaine in the past.  Currently, occasional THC.  CURRENT MEDICATIONS PRIOR TO ADMISSION: 1. Advair 250/50. 2. Avelox 400 mg daily. 3. Albuterol inhaler q.4 hours p.r.n.  ALLERGIES:  No known drug allergies.  CURRENT LABORATORY DATA:  Sodium 143, potassium 4.3, chloride 107, CO2 of 25, BUN 13, creatinine 0.87, glucose 131, hemoglobin 12.3, hematocrit 36.8, white blood cells 17.1, platelets 190, total bili 0.6, alkaline phosphatase 63, AST 21, ALT 15, total protein 7.1, albumin 3.9.  He is MRSA negative.  ABG on admission pH 7.39, pCO2 of 46, pO2 of 76, bicarb 27.5, calcium 9.1.  RADIOLOGY:  Chest x-ray revealing hyperaeration, clear lungs, and emphysematous with bronchitis changes.  EKG revealing sinus tachycardia with a rate of 169 beats per minute with no ischemic changes.  PHYSICAL EXAMINATION:  VITAL SIGNS:  Blood pressure 123/74, pulse 83, respirations 20, temperature 98.5, O2  sat 99% on 2.5 liters, current weight 53.9 kg, weight in December of 2011 55.4 kg. GENERAL:  He is awake, alert, oriented, in no acute distress. HEENT:  Head is normocephalic and atraumatic.  Eyes PERRLA. NECK:  Supple without JVD, thyromegaly, or carotid bruits. CARDIOVASCULAR:  Regular rate and rhythm.  There are no crackles or rubs or extra heart sounds noted.  Pulses are 2+ and equal without bruits. LUNGS:  Prolonged expiratory phase with expiratory wheezes and inspiratory crackles. ABDOMEN:  Soft, nontender with 2+ bowel sounds. EXTREMITIES:  Without clubbing, cyanosis, or edema. NEUROLOGIC:  Cranial nerves II-XII are grossly intact.  IMPRESSION: 1. Abnormal echocardiogram with  questionable apical thrombus or mass     per Dr. Marvel Plan reading.  He will determine need for TEE versus     cardiac CT in the setting.  Please review his handwritten note for     recommendations. 2. History of asthma with admission and exacerbation in December and     currently being treated for same.  He is now on Avelox and Xopenex     treatments with Atrovent per primary care physician during this     admission.  This is a 60 year old African American male admitted with labored breathing and tachycardia with being treated for asthma exacerbation along with bronchitis who was found to have an abnormal echocardiogram revealing a questionable apical thrombus or mass.  The patient has to be evaluated by Cardiology for need for a TEE or other cardiac eval in the setting.  More recommendations will be made per Dr. Dietrich [name] on his evaluation and treatment.  Please see his handwritten note and for plan.  On behalf of the physicians and providers of [facility] [employer], we would like to thank the [facility] for allowing [place] to participate in the care of this patient.     Bettey Mare. Lyman Bishop, NP   ______________________________ Ian Friends. Dietrich Pates, MD, [facility]    KML/MEDQ  D:  02/06/2010  T:  02/07/2010  Job:  [id number]  cc:   Dr. Lodema Hong  Electronically Signed by Joni Reining NP on 02/08/2010 04:45:46 PM Electronically Signed by Venedy Bing MD [facility] on 02/12/2010 08:18:39 AM

## 2010-02-14 ENCOUNTER — Encounter: Payer: Self-pay | Admitting: Family Medicine

## 2010-02-14 NOTE — Letter (Signed)
[facility]: [facility]   Imported By: Lind Guest 02/05/2010 10:08:13  _____________________________________________________________________  External Attachment:    Type:   Image     Comment:   External Document

## 2010-02-21 ENCOUNTER — Encounter: Payer: Self-pay | Admitting: *Deleted

## 2010-02-22 ENCOUNTER — Inpatient Hospital Stay (HOSPITAL_COMMUNITY)
Admission: RE | Admit: 2010-02-22 | Discharge: 2010-02-26 | DRG: 208 | Disposition: A | Discharge status: Home / Self Care | Payer: Medicare Other | Source: Ambulatory Visit | Attending: Internal Medicine | Admitting: Internal Medicine

## 2010-02-22 ENCOUNTER — Encounter (INDEPENDENT_AMBULATORY_CARE_PROVIDER_SITE_OTHER): Payer: Self-pay | Admitting: *Deleted

## 2010-02-22 ENCOUNTER — Ambulatory Visit: Payer: Medicare Other | Admitting: Adult Health

## 2010-02-22 ENCOUNTER — Emergency Department (HOSPITAL_COMMUNITY): Payer: Medicare Other

## 2010-02-22 DIAGNOSIS — J441 Chronic obstructive pulmonary disease with (acute) exacerbation: Secondary | ICD-10-CM | POA: Diagnosis present

## 2010-02-22 DIAGNOSIS — J9600 Acute respiratory failure, unspecified whether with hypoxia or hypercapnia: Principal | ICD-10-CM | POA: Diagnosis present

## 2010-02-22 DIAGNOSIS — I10 Essential (primary) hypertension: Secondary | ICD-10-CM | POA: Diagnosis present

## 2010-02-22 DIAGNOSIS — F1210 Cannabis abuse, uncomplicated: Secondary | ICD-10-CM | POA: Diagnosis present

## 2010-02-22 DIAGNOSIS — F17200 Nicotine dependence, unspecified, uncomplicated: Secondary | ICD-10-CM | POA: Diagnosis present

## 2010-02-22 LAB — BASIC METABOLIC PANEL
BUN: 13 mg/dL (ref 6–23)
CO2: 25 mEq/L (ref 19–32)
Calcium: 8.6 mg/dL (ref 8.4–10.5)
Chloride: 106 mEq/L (ref 96–112)
Creatinine, Ser: 0.97 mg/dL (ref 0.4–1.5)
GFR calc Af Amer: >60 mL/min (ref >60)

## 2010-02-22 LAB — ETHANOL: Alcohol, Ethyl (B): <5 mg/dL (ref 0–10)

## 2010-02-22 LAB — GLUCOSE, CAPILLARY
Glucose-Capillary: 179 mg/dL — ABNORMAL HIGH (ref 70–99)
Glucose-Capillary: 148 mg/dL — ABNORMAL HIGH (ref 70–99)
Glucose-Capillary: 130 mg/dL — ABNORMAL HIGH (ref 70–99)
Glucose-Capillary: 146 mg/dL — ABNORMAL HIGH (ref 70–99)

## 2010-02-22 LAB — BLOOD GAS, ARTERIAL
Acid-Base Excess: 1.2 mmol/L (ref 0.0–2.0)
Bicarbonate: 25.4 mEq/L — ABNORMAL HIGH (ref 20.0–24.0)
Bicarbonate: 27.1 mEq/L — ABNORMAL HIGH (ref 20.0–24.0)
Bicarbonate: 25.1 mEq/L — ABNORMAL HIGH (ref 20.0–24.0)
FIO2: 40 %
MECHVT: 550 mL
O2 Content: 50 L/min
O2 Saturation: 98.8 %
PEEP: 5 cmH2O
PEEP: 5 cmH2O
RATE: 14 resp/min
TCO2: 23.2 mmol/L (ref 0–100)
pCO2 arterial: 51.4 mmHg — ABNORMAL HIGH (ref 35.0–45.0)
pCO2 arterial: 46.5 mmHg — ABNORMAL HIGH (ref 35.0–45.0)
pH, Arterial: 7.351 (ref 7.350–7.450)
pO2, Arterial: 200 mmHg — ABNORMAL HIGH (ref 80.0–100.0)
pO2, Arterial: 139 mmHg — ABNORMAL HIGH (ref 80.0–100.0)
pO2, Arterial: 109 mmHg — ABNORMAL HIGH (ref 80.0–100.0)

## 2010-02-22 LAB — MRSA PCR SCREENING

## 2010-02-22 LAB — RAPID URINE DRUG SCREEN, HOSP PERFORMED
Amphetamines: NOT DETECTED
Cocaine: NOT DETECTED
Opiates: NOT DETECTED
Tetrahydrocannabinol: POSITIVE — AB

## 2010-02-22 LAB — DIFFERENTIAL
Basophils Absolute: 0.1 10*3/uL (ref 0.0–0.1)
Basophils Relative: 0 % (ref 0–1)
Eosinophils Absolute: 0.4 10*3/uL (ref 0.0–0.7)
Eosinophils Relative: 3 % (ref 0–5)
Monocytes Absolute: 1 10*3/uL (ref 0.1–1.0)

## 2010-02-22 LAB — URINALYSIS, ROUTINE W REFLEX MICROSCOPIC
Bilirubin Urine: NEGATIVE
Ketones, ur: NEGATIVE mg/dL
Nitrite: NEGATIVE
Urobilinogen, UA: 0.2 mg/dL (ref 0.0–1.0)

## 2010-02-22 LAB — CBC
MCHC: 32.5 g/dL (ref 30.0–36.0)
RDW: 13.8 % (ref 11.5–15.5)

## 2010-02-22 NOTE — Miscellaneous (Signed)
Summary: Home Care Report  Home Care Report   Imported By: Lind Guest 02/15/2010 14:25:53  _____________________________________________________________________  External Attachment:    Type:   Image     Comment:   External Document

## 2010-02-23 ENCOUNTER — Inpatient Hospital Stay (HOSPITAL_COMMUNITY): Payer: Medicare Other

## 2010-02-23 ENCOUNTER — Ambulatory Visit: Payer: Medicare Other | Admitting: Adult Health

## 2010-02-23 LAB — GLUCOSE, CAPILLARY
Glucose-Capillary: 126 mg/dL — ABNORMAL HIGH (ref 70–99)
Glucose-Capillary: 143 mg/dL — ABNORMAL HIGH (ref 70–99)
Glucose-Capillary: 100 mg/dL — ABNORMAL HIGH (ref 70–99)
Glucose-Capillary: 114 mg/dL — ABNORMAL HIGH (ref 70–99)

## 2010-02-23 LAB — COMPREHENSIVE METABOLIC PANEL
Alkaline Phosphatase: 41 U/L (ref 39–117)
BUN: 15 mg/dL (ref 6–23)
Chloride: 108 mEq/L (ref 96–112)
GFR calc non Af Amer: >60 mL/min (ref >60)
Glucose, Bld: 116 mg/dL — ABNORMAL HIGH (ref 70–99)
Potassium: 4.2 mEq/L (ref 3.5–5.1)
Total Bilirubin: 0.4 mg/dL (ref 0.3–1.2)

## 2010-02-23 LAB — BLOOD GAS, ARTERIAL
Bicarbonate: 24.3 mEq/L — ABNORMAL HIGH (ref 20.0–24.0)
Bicarbonate: 24.3 mEq/L — ABNORMAL HIGH (ref 20.0–24.0)
MECHVT: 550 mL
O2 Saturation: 99 %
PEEP: 5 cmH2O
Patient temperature: 37
Patient temperature: 37
Pressure support: 5 cmH2O
TCO2: 22.4 mmol/L (ref 0–100)
pH, Arterial: 7.373 (ref 7.350–7.450)

## 2010-02-23 LAB — DIFFERENTIAL
Basophils Absolute: 0 10*3/uL (ref 0.0–0.1)
Basophils Relative: 0 % (ref 0–1)
Neutro Abs: 6.8 10*3/uL (ref 1.7–7.7)
Neutrophils Relative %: 92 % — ABNORMAL HIGH (ref 43–77)

## 2010-02-23 LAB — CBC
Hemoglobin: 10.4 g/dL — ABNORMAL LOW (ref 13.0–17.0)
MCHC: 33.2 g/dL (ref 30.0–36.0)
RBC: 3.37 MIL/uL — ABNORMAL LOW (ref 4.22–5.81)
WBC: 7.3 10*3/uL (ref 4.0–10.5)

## 2010-02-24 ENCOUNTER — Inpatient Hospital Stay (HOSPITAL_COMMUNITY): Payer: Medicare Other

## 2010-02-24 LAB — BLOOD GAS, ARTERIAL
Bicarbonate: 25.9 mEq/L — ABNORMAL HIGH (ref 20.0–24.0)
O2 Saturation: 93.9 %
Patient temperature: 37
pCO2 arterial: 47.2 mmHg — ABNORMAL HIGH (ref 35.0–45.0)

## 2010-02-24 LAB — GLUCOSE, CAPILLARY: Glucose-Capillary: 134 mg/dL — ABNORMAL HIGH (ref 70–99)

## 2010-02-25 LAB — CBC
HCT: 32.2 % — ABNORMAL LOW (ref 39.0–52.0)
Hemoglobin: 10.6 g/dL — ABNORMAL LOW (ref 13.0–17.0)
MCH: 30.4 pg (ref 26.0–34.0)
MCHC: 32.9 g/dL (ref 30.0–36.0)
MCV: 92.3 fL (ref 78.0–100.0)
Platelets: 139 10*3/uL — ABNORMAL LOW (ref 150–400)
RBC: 3.49 MIL/uL — ABNORMAL LOW (ref 4.22–5.81)
RDW: 14 % (ref 11.5–15.5)
WBC: 10.2 10*3/uL (ref 4.0–10.5)

## 2010-02-25 LAB — DIFFERENTIAL
Basophils Absolute: 0 10*3/uL (ref 0.0–0.1)
Basophils Relative: 0 % (ref 0–1)
Eosinophils Absolute: 0 10*3/uL (ref 0.0–0.7)
Eosinophils Relative: 0 % (ref 0–5)
Lymphocytes Relative: 2 % — ABNORMAL LOW (ref 12–46)
Lymphs Abs: 0.2 10*3/uL — ABNORMAL LOW (ref 0.7–4.0)
Monocytes Absolute: 0.2 10*3/uL (ref 0.1–1.0)
Monocytes Relative: 2 % — ABNORMAL LOW (ref 3–12)
Neutro Abs: 9.7 10*3/uL — ABNORMAL HIGH (ref 1.7–7.7)
Neutrophils Relative %: 96 % — ABNORMAL HIGH (ref 43–77)

## 2010-02-25 LAB — BASIC METABOLIC PANEL
BUN: 13 mg/dL (ref 6–23)
CO2: 30 mEq/L (ref 19–32)
Calcium: 8.9 mg/dL (ref 8.4–10.5)
Chloride: 105 mEq/L (ref 96–112)
Creatinine, Ser: 0.73 mg/dL (ref 0.4–1.5)
GFR calc Af Amer: >60 mL/min (ref >60)
GFR calc non Af Amer: >60 mL/min (ref >60)
Glucose, Bld: 134 mg/dL — ABNORMAL HIGH (ref 70–99)
Potassium: 4.2 mEq/L (ref 3.5–5.1)
Sodium: 142 mEq/L (ref 135–145)

## 2010-02-25 LAB — BLOOD GAS, ARTERIAL
Acid-Base Excess: 4.6 mmol/L — ABNORMAL HIGH (ref 0.0–2.0)
Bicarbonate: 29 mEq/L — ABNORMAL HIGH (ref 20.0–24.0)
O2 Content: 1 L/min
O2 Saturation: 96.3 %
Patient temperature: 37
TCO2: 26.7 mmol/L (ref 0–100)
pCO2 arterial: 47.2 mmHg — ABNORMAL HIGH (ref 35.0–45.0)
pH, Arterial: 7.406 (ref 7.350–7.450)
pO2, Arterial: 81.8 mmHg (ref 80.0–100.0)

## 2010-02-25 LAB — MRSA CULTURE

## 2010-02-25 LAB — GLUCOSE, CAPILLARY
Glucose-Capillary: 157 mg/dL — ABNORMAL HIGH (ref 70–99)
Glucose-Capillary: 166 mg/dL — ABNORMAL HIGH (ref 70–99)

## 2010-02-26 LAB — DIFFERENTIAL
Basophils Absolute: 0 10*3/uL (ref 0.0–0.1)
Basophils Relative: 0 % (ref 0–1)
Eosinophils Absolute: 0 10*3/uL (ref 0.0–0.7)
Eosinophils Relative: 0 % (ref 0–5)
Lymphocytes Relative: 7 % — ABNORMAL LOW (ref 12–46)
Lymphs Abs: 0.5 10*3/uL — ABNORMAL LOW (ref 0.7–4.0)
Monocytes Absolute: 0.4 10*3/uL (ref 0.1–1.0)
Monocytes Relative: 5 % (ref 3–12)
Neutro Abs: 6.8 10*3/uL (ref 1.7–7.7)
Neutrophils Relative %: 89 % — ABNORMAL HIGH (ref 43–77)

## 2010-02-26 LAB — CBC
HCT: 31.3 % — ABNORMAL LOW (ref 39.0–52.0)
Hemoglobin: 10.4 g/dL — ABNORMAL LOW (ref 13.0–17.0)
MCH: 30.5 pg (ref 26.0–34.0)
MCHC: 33.2 g/dL (ref 30.0–36.0)
MCV: 91.8 fL (ref 78.0–100.0)
Platelets: 138 10*3/uL — ABNORMAL LOW (ref 150–400)
RBC: 3.41 MIL/uL — ABNORMAL LOW (ref 4.22–5.81)
RDW: 13.5 % (ref 11.5–15.5)
WBC: 7.6 10*3/uL (ref 4.0–10.5)

## 2010-02-26 LAB — GLUCOSE, CAPILLARY
Glucose-Capillary: 115 mg/dL — ABNORMAL HIGH (ref 70–99)
Glucose-Capillary: 106 mg/dL — ABNORMAL HIGH (ref 70–99)

## 2010-02-26 LAB — BASIC METABOLIC PANEL
BUN: 11 mg/dL (ref 6–23)
CO2: 28 mEq/L (ref 19–32)
Calcium: 8.5 mg/dL (ref 8.4–10.5)
Chloride: 102 mEq/L (ref 96–112)
Creatinine, Ser: 0.65 mg/dL (ref 0.4–1.5)
GFR calc Af Amer: >60 mL/min (ref >60)
GFR calc non Af Amer: >60 mL/min (ref >60)
Glucose, Bld: 129 mg/dL — ABNORMAL HIGH (ref 70–99)
Potassium: 3.7 mEq/L (ref 3.5–5.1)
Sodium: 137 mEq/L (ref 135–145)

## 2010-02-27 LAB — CULTURE, BLOOD (ROUTINE X 2): Culture: NO GROWTH

## 2010-02-27 NOTE — Discharge Summary (Signed)
  [name], [name]                ACCOUNT NO.:  [id number]  MEDICAL RECORD NO.:  [id number]           PATIENT TYPE:  I  LOCATION:  A306                          FACILITY:  [facility]  PHYSICIAN:  Wilson Singer, M.D.DATE OF BIRTH:  01/24/1950  DATE OF ADMISSION:  02/22/2010 DATE OF DISCHARGE:  02/20/2012LH                              DISCHARGE SUMMARY   FINAL DISCHARGE DIAGNOSES: 1. Chronic obstructive pulmonary disease exacerbation versus asthma     requiring intubation and mechanical ventilation. 2. Ongoing tobacco and marijuana abuse. 3. Hypertension.  CONDITION ON DISCHARGE:  Stable.  MEDICATIONS ON DISCHARGE: 1. Zithromax 500 mg daily for 5 days. 2. Prednisone 20 mg daily for 5 days and then discontinue. 3. Albuterol inhaler 2 puffs every 6 hours p.r.n. 4. Advair Diskus 500/50 two puffs b.i.d. 5. Triamterene/HCTZ 37.5/25 half a tablet daily. 6. Simvastatin 40 mg bedtime.  HISTORY:  This 60 year old man was admitted with acute respiratory failure and to the point where he became dyspneic very quickly and in the emergency room need to be intubated and ventilated.  Please see initial history and physical examination done by Dr. [name].  HOSPITAL PROGRESS:  Of note, this patient is now being intubated twice in the last few months.  He tolerated intubation well and was able to be liberated from the ventilator by February 24, 2010.  He did well off the ventilator and he was continued with antibiotics and steroids.  Today, he looks very good, wants to go home.  On examination, there is no dyspnea or cough by symptoms.  PHYSICAL EXAMINATION:  VITAL SIGNS:  Temperature 97.7, blood pressure 160/81, pulse 75, saturation 95% on room air. HEART:  Heart sounds are present and normal without murmurs. LUNGS:  Lung fields are entirely clear with no evidence of wheezing, crackles, or bronchial breathing. NEUROLOGIC:  He is alert and oriented without any focal  neurologic signs.  Investigations today show sodium of 137, potassium 3.7, bicarbonate 28, BUN 11, creatinine 0.65, hemoglobin 10.4, white blood cell count 7.6, platelets 138.  Chest x-ray done yesterday shows no new airspace disease, edema, or enlarging infusion.  Blood cultures that were done on February 22, 2010, when he was admitted were negative.  DISPOSITION:  The patient now is stable to be discharged.  He must follow up with his primary care physician, Dr. Lodema Hong, and Dr. Juanetta Gosling in the near future.     Wilson Singer, M.D.     NCG/MEDQ  D:  02/26/2010  T:  02/27/2010  Job:  [id number]  cc:   Milus Mallick. Lodema Hong, M.D. Fax: [phone]  Gerrit Friends. Dietrich Pates, MD, [facility] [address] [place], [place] [address]  Oneal Deputy. Juanetta Gosling, M.D. Fax: [phone]  Electronically Signed by Lilly Cove M.D. on 02/27/2010 02:24:58 PM

## 2010-02-28 NOTE — Letter (Signed)
Summary: Appointment - Missed  [facility] [place]  [address] [place] [address]   Phone: [phone]  Fax: [phone]     February 22, 2010 [name]: [id number]   [name] [address] [place], [place]  [address]   Dear [name],  Our records indicate you missed your appointment on    02/22/10                    with Dr.       .          MCDOWELL                          It is very important that we reach you to reschedule this appointment. We look forward to participating in your health care needs. Please contact us at the number listed above at your earliest convenience to reschedule this appointment.     Sincerely,    Glass blower/designer

## 2010-02-28 NOTE — Letter (Signed)
Summary: Appointment - Missed  [facility] [place]  [address] [place] [address]   Phone: [phone]  Fax: [phone]     February 22, 2010 [name]: [id number]   [name] [address] [place], [place]  [address]   Dear [name],  Our records indicate you missed your appointment on      Joni Reining NP                       It is very important that we reach you to reschedule this appointment. We look forward to participating in your health care needs. Please contact us at the number listed above at your earliest convenience to reschedule this appointment.     Sincerely,    Glass blower/designer

## 2010-03-04 NOTE — H&P (Signed)
NAMEGAURAV, BALDREE                ACCOUNT NO.:  [id number]  MEDICAL RECORD NO.:  [id number]           PATIENT TYPE:  LOCATION:                                 FACILITY:  PHYSICIAN:  Alaysiah Browder L. Lendell Caprice, MDDATE OF BIRTH:  09/07/1950  DATE OF ADMISSION:  02/04/2010 DATE OF DISCHARGE:  LH                             HISTORY & PHYSICAL   CHIEF COMPLAINT:  Shortness of breath.  HISTORY OF PRESENT ILLNESS:  [name] is a 60 year old black male with a history of recurrent admissions for respiratory failure.  His last admission was a month ago and he required intubation.  He carries the diagnosis of asthma, but he smokes tobacco and marijuana.  He had significant tachycardia, tachypnea, and labored breathing and was therefore put on BiPAP.  He has received several bronchodilator treatments as well as Ativan and 125 mg of Solu-Medrol.  The patient is able to answer questions, but taking history is difficult due to him being on BiPAP.  He reports that he currently feels better and is breathing easier.  He reports that he continues to smoke marijuana and cigarettes.  He denies drinking or other drug use.  He has had a cough. Denies fevers or chills.  PAST MEDICAL HISTORY:  As above.  Also history of polysubstance abuse including alcohol and marijuana, history of significant tachycardia, sinus tachycardia during his last admission.  MEDICATIONS:  According to last discharge summary were Advair and albuterol, but unable to get history.  ALLERGIES:  No known drug allergies.  SOCIAL HISTORY:  As above.  FAMILY HISTORY:  Reportedly significant for hypertension.  REVIEW OF SYSTEMS:  Difficult due to the above mentioned reasons but as above otherwise negative.  PHYSICAL EXAMINATION:  VITAL SIGNS:  Temperature is 99.5 rectally; blood pressure was 151/82; heart rate initially 160, currently about 120; respiratory rate was initially up into the 40s, currently 24.  Oxygen saturations  were 100% on supplemental oxygen. GENERAL:  The patient is a thin black male who is comfortable with his eyes closed on BiPAP, easily arousable.  He follows commands and answers questions. HEENT:  Pupils equal, round, reactive to light.  Sclerae nonicteric. BiPAP mask over his nose and mouth make it difficult to examine his mouth. NECK:  Supple.  No JVD. LUNGS:  Diminished throughout without wheezes, rhonchi, or rales. According to respiratory therapist, he had poor air movement initially in that he is much improved. CARDIOVASCULAR:  Fast, regular.  No murmurs, gallops, or rubs. ABDOMEN:  Soft, nontender. GU:  Deferred. RECTAL:  Deferred. EXTREMITIES:  No clubbing, cyanosis, or edema. SKIN:  No rash. PSYCHIATRIC:  Cooperative and calm.  LABORATORY DATA:  ABG on BiPAP showed a pH of 7.315, pCO2 52, pO2 193, bicarbonate 25, oxygen saturation 99%.  CBC normal.  Complete metabolic panel after getting Solu-Medrol was significant for a glucose of 173, cardiac markers normal.  Chest x-ray showed hyperinflation.  No consolidation.  EKG shows sinus tachycardia with a lot of baseline artifact and heart rate of 160.  ASSESSMENT AND PLAN: 1. Acute respiratory failure secondary to obstructive asthma, suspect     chronic  obstructive pulmonary disease exacerbation.  I will     continue IV steroids.  I am unable to get much history with regard     to cough.  I will cover empirically with Avelox IV for now in case     this is precipitated by an acute bronchitis.  Continue Xopenex,     Atrovent, and BiPAP.  Monitor blood glucose while on steroids. 2. Continued tobacco and marijuana use.  He will need counseling to     quit.  I will check a urine drug screen and also blood alcohol     level. 3. Reported history of alcohol abuse, denies current, see above.  I     will give thiamine for now and Ativan as needed.  Total critical care time is 45 minutes.     Zasha Belleau L. Lendell Caprice,  MD     CLS/MEDQ  D:  02/04/2010  T:  02/05/2010  Job:  [id number]  cc:   Milus Mallick. Lodema Hong, M.D. Fax: [phone]  Oneal Deputy. Juanetta Gosling, M.D. Fax: [phone]  Electronically Signed by Crista Curb MD on 03/04/2010 09:25:43 PM

## 2010-03-05 NOTE — Progress Notes (Signed)
  NAMESHRIYAN, ARAKAWA                ACCOUNT NO.:  [id number]  MEDICAL RECORD NO.:  [id number]           PATIENT TYPE:  I  LOCATION:  IC07                          FACILITY:  [facility]  PHYSICIAN:  Marijane Trower L. Juanetta Gosling, M.D.DATE OF BIRTH:  26-Feb-1950  DATE OF PROCEDURE: DATE OF DISCHARGE:                                PROGRESS NOTE   Mr. Gatt is a patient of the hospitalist who is able to be successfully extubated yesterday and is overall much better.  He is awake and alert, looks comfortable.  He has no complaints.  His exam shows a thin male who is in no acute distress.  His pupils are equal, round and reactive to light and accommodation.  Nose and throat are clear.  His neck is supple without masses.  His chest shows rhonchi bilaterally, but he looks much better.  Assessment is that he has respiratory failure, but has improved markedly.  PLAN:  Continue with his treatments, medications and follow.     Bobi Daudelin L. Juanetta Gosling, M.D.  ELH/MEDQ  D:  02/24/2010  T:  02/24/2010  Job:  [id number]  Electronically Signed by Kari Baars M.D. on 03/05/2010 02:01:35 PM

## 2010-03-05 NOTE — Consult Note (Signed)
  [name], [name]                ACCOUNT NO.:  [id number]  MEDICAL RECORD NO.:  [id number]           PATIENT TYPE:  I  LOCATION:  IC07                          FACILITY:  [facility]  PHYSICIAN:  Abiola Behring L. Juanetta Gosling, M.D.DATE OF BIRTH:  Mar 03, 1950  DATE OF CONSULTATION:  02/22/2010 DATE OF DISCHARGE:                                CONSULTATION   [name] is a patient of the [facility].  REASON FOR CONSULTATION:  Ventilator-dependent acute respiratory failure.  HISTORY:  [name] is a 60 year old who has a long known history of severe COPD.  He came to the emergency room after having increasing shortness of breath.  He took several nebulizer treatments at home, was unable to communicate in the emergency room, and he was intubated.  The rest of his history is obtained from the chart because he cannot provide any now because he is intubated and sedated.  PAST MEDICAL HISTORY:  Positive for severe COPD.  He has been intubated at least one previous episode.  He has history of stroke, history of chronic alcohol abuse, history of polysubstance abuse, history of GI bleeding.  He has also had a history of cardiac disease, but I do not know the details of all that.  SOCIAL HISTORY:  Apparently, he is still smoking.  It is not clear if he is still using multiple drugs.  FAMILY HISTORY:  Unknown.  REVIEW OF SYSTEMS:  Unknown.  PHYSICAL EXAMINATION:  GENERAL:  He is intubated, opens his eyes, but makes no other response. VITAL SIGNS:  His blood pressure is running in the 110-120 range, pulse in the 90s, his O2 sats 100%. NEUROLOGIC:  Grossly intact.  He does move all four extremities. MOUTH:  Mucous membranes are dry. NECK:  Supple without masses. CHEST:  Markedly decreased breath sounds. HEART:  Regular without murmur, gallop, or rub. ABDOMEN:  Soft.  No masses felt.  Bowel sounds present and active. EXTREMITIES:  No edema.  His blood gas is adequate.  Chest x-ray looks okay  without acute infiltrate.  Assessment then is that he has ventilator-dependent respiratory failure.  PLAN:  I do not think he is ready for any sort of weaning today.  We will plan to continue with his treatments and follow.     Jovannie Ulibarri L. Juanetta Gosling, M.D.     ELH/MEDQ  D:  02/23/2010  T:  02/23/2010  Job:  [id number]  cc:   Milus Mallick. Lodema Hong, M.D. Fax: [phone]  Electronically Signed by Kari Baars M.D. on 03/05/2010 02:01:28 PM

## 2010-03-05 NOTE — Progress Notes (Signed)
  NAMEOSWELL, SAY                ACCOUNT NO.:  [id number]  MEDICAL RECORD NO.:  [id number]           PATIENT TYPE:  I  LOCATION:  IC07                          FACILITY:  [facility]  PHYSICIAN:  Davier Tramell L. Juanetta Gosling, M.D.DATE OF BIRTH:  15-Nov-1950  DATE OF PROCEDURE: DATE OF DISCHARGE:                                PROGRESS NOTE   The patient of the [facility].  Mr. Allman remains intubated on the ventilator.  He is sedated, although his sedation has now been turned off.  No new problems have been noted through the night.  His blood pressures running in the 110 systolic range, pulse about 90, O2 sats 99%.  He is on the ventilator.  He is afebrile.  His chest shows some rhonchi, mostly decreased breath sounds.  His heart is regular without gallop.  He does not have any edema.  White blood count 7300, hemoglobin is 10.4, platelets 145,000.  His BUN is 15, creatinine 0.95, albumin is 3.  Blood gas on 40% 550, rate of 14.  She has a pH 7.37, pCO2 of 43, pO2 of 153, and his chest x-ray has not been done yet.  ASSESSMENT:  He is better.  PLAN:  We will see what he can do with weaning today.  He may be able to be extubated.     Gini Caputo L. Juanetta Gosling, M.D.     ELH/MEDQ  D:  02/23/2010  T:  02/23/2010  Job:  [id number]  Electronically Signed by Kari Baars M.D. on 03/05/2010 02:01:32 PM

## 2010-03-05 NOTE — Progress Notes (Signed)
  NAMEANTONIOUS, [name]                ACCOUNT NO.:  [id number]  MEDICAL RECORD NO.:  [id number]           PATIENT TYPE:  I  LOCATION:  A306                          FACILITY:  [facility]  PHYSICIAN:  Braylon Grenda L. Juanetta Gosling, M.D.DATE OF BIRTH:  1950-01-12  DATE OF PROCEDURE:  02/25/2010 DATE OF DISCHARGE:                                PROGRESS NOTE   [name] is overall I think about the same.  He has no new complaints. He has been transferred from the Intensive Care Unit and did well through the night.  His exam shows that his temperature is 97.8, pulse 59, respirations 16, blood pressure 139/71, and O2 sat 97% on 2 liters. His chest is clear with decreased breath sounds.  His heart is regular and overall he looks much better.  ASSESSMENT:  He has had an episode of acute respiratory failure, multifactorial.  He has coronary artery occlusive disease, but overall I think he is much improved, and we will plan to continue his current treatments and follow.     Ishana Blades L. Juanetta Gosling, M.D.     ELH/MEDQ  D:  02/25/2010  T:  02/25/2010  Job:  [id number]  Electronically Signed by Kari Baars M.D. on 03/05/2010 02:01:40 PM

## 2010-03-06 ENCOUNTER — Ambulatory Visit (INDEPENDENT_AMBULATORY_CARE_PROVIDER_SITE_OTHER): Payer: Medicare Other | Admitting: Adult Health

## 2010-03-06 ENCOUNTER — Encounter: Payer: Self-pay | Admitting: Adult Health

## 2010-03-06 DIAGNOSIS — R9389 Abnormal findings on diagnostic imaging of other specified body structures: Secondary | ICD-10-CM

## 2010-03-06 DIAGNOSIS — I10 Essential (primary) hypertension: Secondary | ICD-10-CM

## 2010-03-06 DIAGNOSIS — J449 Chronic obstructive pulmonary disease, unspecified: Secondary | ICD-10-CM

## 2010-03-06 HISTORY — DX: Abnormal findings on diagnostic imaging of other specified body structures: R93.89

## 2010-03-07 ENCOUNTER — Encounter: Payer: Self-pay | Admitting: Adult Health

## 2010-03-07 ENCOUNTER — Telehealth: Payer: Self-pay | Admitting: Family Medicine

## 2010-03-13 ENCOUNTER — Encounter: Payer: Self-pay | Admitting: Family Medicine

## 2010-03-13 ENCOUNTER — Ambulatory Visit (INDEPENDENT_AMBULATORY_CARE_PROVIDER_SITE_OTHER): Payer: Medicare Other | Admitting: Family Medicine

## 2010-03-13 DIAGNOSIS — J45909 Unspecified asthma, uncomplicated: Secondary | ICD-10-CM

## 2010-03-13 DIAGNOSIS — J449 Chronic obstructive pulmonary disease, unspecified: Secondary | ICD-10-CM

## 2010-03-13 DIAGNOSIS — E785 Hyperlipidemia, unspecified: Secondary | ICD-10-CM

## 2010-03-13 DIAGNOSIS — I10 Essential (primary) hypertension: Secondary | ICD-10-CM

## 2010-03-14 ENCOUNTER — Encounter: Payer: Self-pay | Admitting: Family Medicine

## 2010-03-14 ENCOUNTER — Telehealth (INDEPENDENT_AMBULATORY_CARE_PROVIDER_SITE_OTHER): Payer: Self-pay | Admitting: *Deleted

## 2010-03-15 NOTE — Progress Notes (Signed)
Summary: [facility]  Phone Note Call from Patient   Summary of Call: calling from [facility] about pt wanting to move  [phone] Initial call taken by: Rudene Anda,  March 07, 2010 3:53 PM  Follow-up for Phone Call        social worker tracy with [facility] called and states she will go out and discuss housing options with patient, gave okay Follow-up by: Adella Hare LPN,  March 07, 2010 4:06 PM

## 2010-03-15 NOTE — Assessment & Plan Note (Signed)
Summary: post hosp [name] per Dale on 300/tg   Visit Type:  Follow-up Primary Provider:  Dr.Margaret Lodema Hong   History of Present Illness: [name] is a 60 y/o AAM we are following s/p hospitalization where we were consulted for abnormal echocardiogram results. He was admitted with COPD exacerbation with known history of same, ongoing tobacco and cannibus use, hypertension.  He has had frequent admissions for VDRF.  Echocardiogram read by Dr. Dietrich Pates on 02/05/2010 demonstrated a nodular density adherent to the apical wall, likely representing a thrombus or mass.  During hospitalization, a prior echo was reviewed by Dr. Dietrich Pates and the echodensity was also found at that time.  It has not changed.  He therefore was not scheduled for TEE for closer evaluation.    [name] is without complaint. He unfortunately continues to smoke.  He states that he takes his medications as directed.   Current Medications (verified): 1)  Maxzide-25 37.5-25 Mg Tabs (Triamterene-Hctz) .... Take 1 Tablet By Mouth Once A Day 2)  Simvastatin 40 Mg Tabs (Simvastatin) .... Take 1 Tab By Mouth At Bedtime 3)  Proair Hfa 108 (90 Base) Mcg/act Aers (Albuterol Sulfate) .... Two Puffs Every 8 Hours As Needed 4)  Advair Diskus 250-50 Mcg/dose Aepb (Fluticasone-Salmeterol) .... One Puff Daily 5)  Albuterol Sulfate (2.5 Mg/68ml) 0.083% Nebu (Albuterol Sulfate) .... One Neb Every 6 To 8 Hours As Needed For Wheezing 6)  Ipratropium Bromide 0.02 % Soln (Ipratropium Bromide) .... One Inhalationion Every 6 To 8 Hours As Needed For Wheezing, Pls Mix With Albuterol Solution  Before Use  Allergies (verified): No Known Drug Allergies  Comments:  Nurse/Medical Assistant: patient brought meds we reviewed [facility]  Past History:  Past medical, surgical, family and social histories (including risk factors) reviewed, and no changes noted (except as noted below).  Past Medical History: Reviewed  history from 08/31/2007 and no changes required. Current Problems:  NICOTINE ADDICTION (ICD-305.1) COPD (ICD-496) DEPRESSION (ICD-311) HYPERLIPIDEMIA (ICD-272.4) HYPERTENSION (ICD-401.9) CVA with temporary viion loss in 2008  Past Surgical History: Reviewed history from 01/05/2007 and no changes required. none  Family History: Reviewed history from 08/31/2007 and no changes required. Mother deceased - cause unknown Father deceased 54 - cause unknown One sister living - lung disease Brothers x 2 living , health status unknown  Social History: Reviewed history from 08/31/2007 and no changes required. Unemploed Widower One son Current Smoker Alcohol use-yes Drug use-no  Review of Systems       All other systems have been reviewed and are negative unless stated above.   Vital Signs:  Patient profile:   60 year old male Weight:      121 pounds BMI:     17.93 Pulse rate:   105 / minute BP sitting:   135 / 81  (left arm)  Vitals Entered By: Dreama Saa, CNA (March 06, 2010 1:01 PM)  Physical Exam  General:  Well developed, well nourished, in no acute distress. Eyes:  conjunctival injection.   Lungs:  diminshed bibasilar without WRR. Heart:  Non-displaced PMI, chest non-tender; regular rate and rhythm, S1, S2 without murmurs, rubs or gallops. Carotid upstroke normal, no bruit. Normal abdominal aortic size, no bruits. Femorals normal pulses, no bruits. Pedals normal pulses. No edema, no varicosities. Abdomen:  Bowel sounds positive; abdomen soft and non-tender without masses, organomegaly, or hernias noted. No hepatosplenomegaly. Msk:  Back normal, normal gait. Muscle strength and tone normal. Pulses:  pulses normal in all 4 extremities Extremities:  No clubbing or cyanosis. Neurologic:  Alert and oriented x 3. Psych:  Normal affect.   EKG  Procedure date:  03/06/2010  Findings:      Normal sinus rhythm with rate of: 92 bpm Left ventricular hypertrophy.     Impression & Recommendations:  Problem # 1:  HYPERTENSION (ICD-401.9) Well controlled at present.  He remains medically compliant. His updated medication list for this problem includes:    Maxzide-25 37.5-25 Mg Tabs (Triamterene-hctz) .[name] Kitchen... Take 1 tablet by mouth once a day  Problem # 2:  ECHOCARDIOGRAM, ABNORMAL (ICD-793.2) Discussion with Dr. Dietrich Pates concerning echocardiogram results and need to schedule patient for TEE. He states that he reviewed prior echo's and echo density was found at that time as well and was unchanged.  He did not recommend proceeding with TEE.  We will see this patient on as needed basis.  Patient Instructions: 1)  Your physician recommends that you schedule a follow-up appointment in: as needed  2)  Your physician recommends that you continue on your current medications as directed. Please refer to the Current Medication list given to you today.

## 2010-03-19 LAB — BLOOD GAS, ARTERIAL
Acid-Base Excess: 0.2 mmol/L (ref 0.0–2.0)
Acid-Base Excess: 0.7 mmol/L (ref 0.0–2.0)
Acid-Base Excess: 5.4 mmol/L — ABNORMAL HIGH (ref 0.0–2.0)
Bicarbonate: 29.6 mEq/L — ABNORMAL HIGH (ref 20.0–24.0)
FIO2: 0.4 %
FIO2: 0.6 %
MECHVT: 350 mL
MECHVT: 530 mL
MECHVT: 530 mL
O2 Saturation: 98.4 %
O2 Saturation: 98.9 %
O2 Saturation: 98.9 %
O2 Saturation: 99.1 %
Patient temperature: 37
Patient temperature: 37
Patient temperature: 37
Patient temperature: 37
RATE: 14 resp/min
RATE: 20 resp/min
RATE: 26 resp/min
pCO2 arterial: 42.6 mmHg (ref 35.0–45.0)
pH, Arterial: 7 — CL (ref 7.350–7.450)
pH, Arterial: 7.331 — ABNORMAL LOW (ref 7.350–7.450)
pO2, Arterial: 153 mmHg — ABNORMAL HIGH (ref 80.0–100.0)

## 2010-03-19 LAB — COMPREHENSIVE METABOLIC PANEL
Albumin: 3.9 g/dL (ref 3.5–5.2)
Alkaline Phosphatase: 47 U/L (ref 39–117)
BUN: 14 mg/dL (ref 6–23)
Chloride: 99 mEq/L (ref 96–112)
Creatinine, Ser: 1.14 mg/dL (ref 0.4–1.5)
GFR calc non Af Amer: >60 mL/min (ref >60)
Glucose, Bld: 154 mg/dL — ABNORMAL HIGH (ref 70–99)
Total Bilirubin: 0.3 mg/dL (ref 0.3–1.2)

## 2010-03-19 LAB — GLUCOSE, CAPILLARY
Glucose-Capillary: 118 mg/dL — ABNORMAL HIGH (ref 70–99)
Glucose-Capillary: 132 mg/dL — ABNORMAL HIGH (ref 70–99)
Glucose-Capillary: 136 mg/dL — ABNORMAL HIGH (ref 70–99)
Glucose-Capillary: 138 mg/dL — ABNORMAL HIGH (ref 70–99)
Glucose-Capillary: 144 mg/dL — ABNORMAL HIGH (ref 70–99)
Glucose-Capillary: 149 mg/dL — ABNORMAL HIGH (ref 70–99)
Glucose-Capillary: 160 mg/dL — ABNORMAL HIGH (ref 70–99)

## 2010-03-19 LAB — DIFFERENTIAL
Basophils Absolute: 0 10*3/uL (ref 0.0–0.1)
Basophils Absolute: 0 10*3/uL (ref 0.0–0.1)
Basophils Absolute: 0 10*3/uL (ref 0.0–0.1)
Basophils Absolute: 0 10*3/uL (ref 0.0–0.1)
Basophils Absolute: 0 10*3/uL (ref 0.0–0.1)
Basophils Relative: 0 % (ref 0–1)
Basophils Relative: 0 % (ref 0–1)
Basophils Relative: 0 % (ref 0–1)
Basophils Relative: 0 % (ref 0–1)
Basophils Relative: 0 % (ref 0–1)
Eosinophils Relative: 0 % (ref 0–5)
Eosinophils Relative: 0 % (ref 0–5)
Eosinophils Relative: 1 % (ref 0–5)
Lymphocytes Relative: 3 % — ABNORMAL LOW (ref 12–46)
Lymphocytes Relative: 4 % — ABNORMAL LOW (ref 12–46)
Monocytes Absolute: 0.4 10*3/uL (ref 0.1–1.0)
Monocytes Absolute: 0.4 10*3/uL (ref 0.1–1.0)
Monocytes Absolute: 0.5 10*3/uL (ref 0.1–1.0)
Monocytes Absolute: 0.9 10*3/uL (ref 0.1–1.0)
Neutro Abs: 12.8 10*3/uL — ABNORMAL HIGH (ref 1.7–7.7)
Neutro Abs: 14.6 10*3/uL — ABNORMAL HIGH (ref 1.7–7.7)
Neutro Abs: 15.3 10*3/uL — ABNORMAL HIGH (ref 1.7–7.7)
Neutro Abs: 5.3 10*3/uL (ref 1.7–7.7)
Neutrophils Relative %: 92 % — ABNORMAL HIGH (ref 43–77)
Neutrophils Relative %: 94 % — ABNORMAL HIGH (ref 43–77)

## 2010-03-19 LAB — CBC
HCT: 31.4 % — ABNORMAL LOW (ref 39.0–52.0)
HCT: 33.5 % — ABNORMAL LOW (ref 39.0–52.0)
HCT: 37.4 % — ABNORMAL LOW (ref 39.0–52.0)
HCT: 42.1 % (ref 39.0–52.0)
MCH: 30 pg (ref 26.0–34.0)
MCH: 30.8 pg (ref 26.0–34.0)
MCHC: 32.8 g/dL (ref 30.0–36.0)
MCHC: 33.1 g/dL (ref 30.0–36.0)
MCHC: 33.3 g/dL (ref 30.0–36.0)
MCHC: 34.2 g/dL (ref 30.0–36.0)
MCV: 90.5 fL (ref 78.0–100.0)
MCV: 91.5 fL (ref 78.0–100.0)
MCV: 92.8 fL (ref 78.0–100.0)
Platelets: 141 10*3/uL — ABNORMAL LOW (ref 150–400)
Platelets: 153 10*3/uL (ref 150–400)
Platelets: 232 10*3/uL (ref 150–400)
RBC: 4.03 MIL/uL — ABNORMAL LOW (ref 4.22–5.81)
RDW: 13.6 % (ref 11.5–15.5)
RDW: 14.1 % (ref 11.5–15.5)
RDW: 14.3 % (ref 11.5–15.5)
RDW: 14.6 % (ref 11.5–15.5)
WBC: 16.7 10*3/uL — ABNORMAL HIGH (ref 4.0–10.5)

## 2010-03-19 LAB — RAPID URINE DRUG SCREEN, HOSP PERFORMED
Amphetamines: NOT DETECTED
Barbiturates: NOT DETECTED

## 2010-03-19 LAB — URINE CULTURE
Colony Count: NO GROWTH
Culture  Setup Time: 201112252001

## 2010-03-19 LAB — BASIC METABOLIC PANEL
BUN: 12 mg/dL (ref 6–23)
BUN: 13 mg/dL (ref 6–23)
BUN: 13 mg/dL (ref 6–23)
BUN: 16 mg/dL (ref 6–23)
Calcium: 8.7 mg/dL (ref 8.4–10.5)
Chloride: 107 mEq/L (ref 96–112)
Creatinine, Ser: 0.75 mg/dL (ref 0.4–1.5)
Creatinine, Ser: 1.07 mg/dL (ref 0.4–1.5)
GFR calc non Af Amer: >60 mL/min (ref >60)
GFR calc non Af Amer: >60 mL/min (ref >60)
GFR calc non Af Amer: >60 mL/min (ref >60)
Glucose, Bld: 111 mg/dL — ABNORMAL HIGH (ref 70–99)
Glucose, Bld: 121 mg/dL — ABNORMAL HIGH (ref 70–99)
Glucose, Bld: 124 mg/dL — ABNORMAL HIGH (ref 70–99)
Glucose, Bld: 131 mg/dL — ABNORMAL HIGH (ref 70–99)
Potassium: 3.9 mEq/L (ref 3.5–5.1)
Potassium: 3.9 mEq/L (ref 3.5–5.1)
Potassium: 4.4 mEq/L (ref 3.5–5.1)
Potassium: 4.5 mEq/L (ref 3.5–5.1)
Sodium: 143 mEq/L (ref 135–145)

## 2010-03-19 LAB — URINALYSIS, ROUTINE W REFLEX MICROSCOPIC
Bilirubin Urine: NEGATIVE
Ketones, ur: NEGATIVE mg/dL
Nitrite: NEGATIVE
Specific Gravity, Urine: >1.03 — ABNORMAL HIGH (ref 1.005–1.030)
Urobilinogen, UA: 0.2 mg/dL (ref 0.0–1.0)

## 2010-03-19 LAB — D-DIMER, QUANTITATIVE: D-Dimer, Quant: 0.63 ug/mL-FEU — ABNORMAL HIGH (ref 0.00–0.48)

## 2010-03-19 LAB — CULTURE, RESPIRATORY W GRAM STAIN

## 2010-03-19 LAB — VANCOMYCIN, TROUGH: Vancomycin Tr: 6.3 ug/mL — ABNORMAL LOW (ref 10.0–20.0)

## 2010-03-20 NOTE — Progress Notes (Signed)
Summary: speak nurse  Phone Note Call from Patient   Summary of Call: says medicine hasn't been called in yet. [phone] Initial call taken by: Rudene Anda,  March 14, 2010 9:38 AM  Follow-up for Phone Call        all meds resent Follow-up by: Everitt Amber LPN,  March 14, 2010 11:06 AM    Prescriptions: PROAIR HFA 108 (90 BASE) MCG/ACT AERS (ALBUTEROL SULFATE) two puffs every 8 hours as needed  #1 x 3   Entered by:   Everitt Amber LPN   Authorized by:   Syliva Overman MD   Signed by:   Everitt Amber LPN on 29/52/8413   Method used:   Electronically to        [facility]* (retail)       [address] [place], [place]  [address]       Ph: [phone]       Fax: [phone]   RxID:   [id number] SIMVASTATIN 40 MG TABS (SIMVASTATIN) Take 1 tab by mouth at bedtime  #30 x 3   Entered by:   Everitt Amber LPN   Authorized by:   Syliva Overman MD   Signed by:   Everitt Amber LPN on 66/06/3014   Method used:   Electronically to        [facility] (retail)       [address] [place], [place]  [address]       Ph: [phone]       Fax: [phone]   RxID:   [id number] MAXZIDE-25 37.5-25 MG TABS (TRIAMTERENE-HCTZ) Take 1 tablet by mouth once a day  #30 Tablet x 3   Entered by:   Everitt Amber LPN   Authorized by:   Syliva Overman MD   Signed by:   Everitt Amber LPN on 62/69/4854   Method used:   Electronically to        [facility] (retail)       [address] [place], [place]  [address]       Ph: [phone]       Fax: [phone]   RxID:   [id number]

## 2010-03-20 NOTE — Letter (Signed)
Summary: recert  recert   Imported By: Lind Guest 03/14/2010 11:13:28  _____________________________________________________________________  External Attachment:    Type:   Image     Comment:   External Document

## 2010-03-25 NOTE — H&P (Signed)
[name], [name] NO.:  [id number]  MEDICAL RECORD NO.:  [id number]           PATIENT TYPE:  E  LOCATION:  APED                          FACILITY:  [facility]  PHYSICIAN:  [name], MD     DATE OF BIRTH:  1950/02/07  DATE OF ADMISSION:  02/22/2010 DATE OF DISCHARGE:  LH                             HISTORY & PHYSICAL   PRIMARY MEDICAL DOCTOR:  He is assigned to Dr. Syliva Overman.  He is also being seen by Dr. Dietrich Pates.  ADMISSION DIAGNOSES: 1. Acute respiratory failure. 2. Chronic obstructive pulmonary exacerbation versus status     asthmaticus. 3. Ongoing tobacco and marijuana abuse. 4. History of hypertension.  CHIEF COMPLAINT:  Shortness of breath.  HISTORY OF PRESENT ILLNESS:  The patient is a 60 year old African American male who has a history of COPD/asthma who continues to smoke who also has a previous history of stroke presented to the hospital with complaints of shortness of breath.  The patient was recently admitted to the hospital and was discharged on February 3 after 4 days stay.  He required BiPAP during that admission.  He was also admitted back in late December and at that time he was intubated.  The patient apparently presented with shortness of breath.  He took nebulizer treatments at home with no relief.  In the ED, he was found to be quite tachypneic using accessory muscles, he could barely communicate, and so it was felt that would be best to intubate the patient.  The patient is currently intubated, partially sedated, unable to provide any history.  MEDICATIONS AT HOME:  I do not have a list but based on discharge summary from February 3, he was discharged on Avelox for 3 more days, prednisone taper, triamterene/hydrochlorothiazide, albuterol inhalers as needed, Advair Diskus twice daily, and simvastatin 40 mg at bedtime.  ALLERGIES:  No known drug allergies.  PAST MEDICAL HISTORY:  Positive for COPD/asthma.  He has been  intubated at least once that he knows back in December.  He was again here in late January, early February and was on BiPAP.  He had a history of stroke in 2008 causing diplopia.  He has a history of alcoholism and polysubstance drug abuse.  History of GI bleed in the past.  SOCIAL HISTORY:  Unable to obtain.  FAMILY HISTORY:  Unable to obtain.  REVIEW OF SYSTEMS:  Unable to do because of his intubated status.  PHYSICAL EXAMINATION:  VITAL SIGNS:  Temperature 97.5, heart rate fluctuating between 140-155, blood pressure when he came in was 201/86, saturation initially 89% on 2 liters, subsequently 97% on 100% FiO2, blood pressure improved to 130/87, heart rate is still high. GENERAL:  Thin African American male in no distress.  He is intubated. HEENT:  Head is normocephalic, atraumatic.  Pupils are equal reacting. No pallor, no icterus.  Oral mucous membranes unable to see. NECK:  Soft and supple.  No thyromegaly appreciated.  No cervical, supraclavicular, inguinal lymphadenopathy is present. LUNGS:  Show end-expiratory wheezing bilaterally.  No crackles are present. CARDIOVASCULAR:  S1, S2, tachycardic, regular.  No S3, S4, rubs,  murmurs, or bruits. ABDOMEN:  Soft, nontender, nondistended.  Bowel sounds are present.  No masses or organomegaly is appreciated. GU:  Deferred.  He has got a Foley. MUSCULOSKELETAL:  Normal muscle mass and tone. NEUROLOGICAL:  He is partially sedated, unable to do full neurological examination.  He appears to be moving all his extremities at this time. SKIN:  Does not reveal any rashes.  LABORATORY DATA:  His ABG showed a pH of 7.22, pCO2 is 63, pO2 is 200, bicarb is 25, saturation 98%.  His white cell count is 13.8 with normal differential.  Hemoglobin is 12.4, MCV is 94, platelet count is 198,000. BMET is pending at this time.  Urine drug screen was positive for marijuana.  Alcohol level less than 5.  UA showed specific gravity greater than 1.030.   Chest x-ray was done which shows ET tube in good position and showed emphysema.  ASSESSMENT:  This is a 60 year old African American male with history of chronic obstructive pulmonary disease who has been admitted to the hospital twice in the last 2 months, one he was intubated and the other time he required BiPAP.  The patient unfortunately continues to smoke both cigarettes and marijuana.  He came in with acute respiratory failure possibly from chronic obstructive pulmonary disease exacerbation/status asthmaticus.  He is tachycardic probably from his acute respiratory distress.  According to the ED physician, there was no complaint of any chest pain and he was not moving air quite well when he was initially evaluated.  The patient is most consistent with chronic obstructive pulmonary disease exacerbation.  Pulmonary embolism is in the differential, however, is less likely.  PLAN: 1. Acute respiratory failure.  He is intubated on mechanical     ventilation.  ABGs will be followed up on and he will be consulted     on by Dr. Juanetta Gosling. 2. COPD exacerbation.  He will be given nebulizer treatments,     steroids, antibiotics.  We will put him on Zosyn and Zithromax for     now since he has been on Avelox many times in the last couple of     months.  Advair will be continued. 3. Sinus tachycardia.  Hopefully once he was sedated, his respiratory     status is better, heart rate should improve.  If not, it may     require further evaluation. 4. History of hypertension.  Continue to monitor blood pressures     closely. 5. Nutrition status will need to be addressed in the next day or so. 6. DVT prophylaxis will be administered.  He is a full code.  Further management and decisions will depend on results of the testing and patient's response to treatment.    [name], MD     GK/MEDQ  D:  02/22/2010  T:  02/22/2010  Job:  [id number]  cc:   Dr. Brent General E. Lodema Hong,  M.D. Fax: [phone]  Electronically Signed by [name] MD on 03/25/2010 06:47:36 AM

## 2010-03-27 NOTE — Assessment & Plan Note (Signed)
Summary: follow up from hospital   Vital Signs:  Patient profile:   60 year old male Height:      69 inches Weight:      128 pounds BMI:     18.97 O2 Sat:      95 % Pulse rate:   120 / minute Pulse rhythm:   regular Resp:     16 per minute BP sitting:   124 / 80  (left arm) Cuff size:   regular  Vitals Entered By: Everitt Amber LPN CC: ER follow up, lung collapsed   Primary Care Provider:  Dr.Hoyle Barkdull Lodema Hong  CC:  ER follow up and lung collapsed.  History of Present Illness: pt in for f/u from recent hospitalisation due to respiratory failure from severe asthma atack. He still lives at his home and unfortunately his social circumstances remin poor.  Denies recent fever or chills. Denies sinus pressure, nasal congestion , ear pain or sore throat. Denies chest congestion, or cough productive of sputum. Denies chest pain, palpitations, PND, orthopnea or leg swelling. Denies abdominal pain, nausea, vomitting, diarrhea or constipation. Denies change in bowel movements or bloody stool. Denies dysuria , frequency, incontinence or hesitancy. Denies  joint pain, swelling, or reduced mobility. Denies headaches, vertigo, seizures.  Denies  rash, lesions, or itch.     Preventive Screening-Counseling & Management  Alcohol-Tobacco     Smoking Cessation Counseling: yes  Current Medications (verified): 1)  Maxzide-25 37.5-25 Mg Tabs (Triamterene-Hctz) .... Take 1 Tablet By Mouth Once A Day 2)  Simvastatin 40 Mg Tabs (Simvastatin) .... Take 1 Tab By Mouth At Bedtime 3)  Proair Hfa 108 (90 Base) Mcg/act Aers (Albuterol Sulfate) .... Two Puffs Every 8 Hours As Needed 4)  Advair Diskus 250-50 Mcg/dose Aepb (Fluticasone-Salmeterol) .... One Puff Daily 5)  Albuterol Sulfate (2.5 Mg/42ml) 0.083% Nebu (Albuterol Sulfate) .... One Neb Every 6 To 8 Hours As Needed For Wheezing 6)  Ipratropium Bromide 0.02 % Soln (Ipratropium Bromide) .... One Inhalationion Every 6 To 8 Hours As Needed For  Wheezing, Pls Mix With Albuterol Solution  Before Use  Allergies (verified): No Known Drug Allergies  Review of Systems      See HPI General:  Complains of fatigue, malaise, and weight loss. Eyes:  Complains of vision loss-both eyes. Resp:  Complains of cough, shortness of breath, and wheezing; denies sputum productive. Psych:  Complains of anxiety and depression; denies mental problems, suicidal thoughts/plans, thoughts of violence, and unusual visions or sounds. Endo:  Denies cold intolerance, excessive hunger, excessive thirst, and excessive urination. Heme:  Denies abnormal bruising, bleeding, enlarge lymph nodes, and fevers. Allergy:  Complains of seasonal allergies.  Physical Exam  General:  Under l-nourished,chronically ill appearing male in no acute distress; alert,appropriate and cooperative throughout examination HEENT: No facial asymmetry,  EOMI, No sinus tenderness, TM's Clear, oropharynx  pink and moist. Chest: Significantly decreased air entry throughout CVS: S1, S2, No murmurs, No S3.   Abd: Soft, Nontender.  MS: Adequate ROM spine, hips, shoulders and knees.  Ext: No edema.   CNS: CN 2-12 intact, power tone and sensation normal throughout.   Skin: Intact, no visible lesions or rashes.  Psych: Good eye contact, normal affect.  Memory intact, not anxious or depressed appearing.    Impression & Recommendations:  Problem # 1:  PSA, INCREASED (ICD-790.93) Assessment Comment Only pt is being followed by urology  Problem # 2:  ASTHMA (ICD-493.90) Assessment: Deteriorated  His updated medication list for this problem includes:  Proair Hfa 108 (90 Base) Mcg/act Aers (Albuterol sulfate) .[name] Kitchen..[name] Kitchen Two puffs every 8 hours as needed    Advair Diskus 250-50 Mcg/dose Aepb (Fluticasone-salmeterol) ..... One puff daily    Albuterol Sulfate (2.5 Mg/30ml) 0.083% Nebu (Albuterol sulfate) ..... One neb every 6 to 8 hours as needed for wheezing    Ipratropium Bromide 0.02 % Soln  (Ipratropium bromide) ..... One inhalationion every 6 to 8 hours as needed for wheezing, pls mix with albuterol solution  before use  Orders: Medicare Electronic Prescription [id number])  Problem # 3:  HYPERTENSION (ICD-401.9) Assessment: Improved  His updated medication list for this problem includes:    Maxzide-25 37.5-25 Mg Tabs (Triamterene-hctz) .[name] Kitchen... Take 1 tablet by mouth once a day  BP today: 124/80 Prior BP: 135/81 (03/06/2010)  Labs Reviewed: K+: 3.9 (10/26/2009) Creat: : 0.99 (10/26/2009)   Chol: 151 (06/26/2009)   HDL: 51 (06/26/2009)   LDL: 90 (06/26/2009)   TG: 50 (06/26/2009)  Problem # 4:  HYPERLIPIDEMIA (ICD-272.4) Assessment: Comment Only  His updated medication list for this problem includes:    Simvastatin 40 Mg Tabs (Simvastatin) .[name] Kitchen... Take 1 tab by mouth at bedtime Low fat dietdiscussed and encouraged  Labs Reviewed: SGOT: 14 (01/09/2009)   SGPT: 16 (01/09/2009)   HDL:51 (06/26/2009), 66 (01/09/2009)  LDL:90 (06/26/2009), 161 (60/45/4098)  Chol:151 (06/26/2009), 237 (01/09/2009)  Trig:50 (06/26/2009), 49 (01/09/2009)  Problem # 5:  NICOTINE ADDICTION (ICD-305.1) Assessment: Unchanged  Encouraged smoking cessation and discussed different methods for smoking cessation.   Complete Medication List: 1)  Maxzide-25 37.5-25 Mg Tabs (Triamterene-hctz) .... Take 1 tablet by mouth once a day 2)  Simvastatin 40 Mg Tabs (Simvastatin) .... Take 1 tab by mouth at bedtime 3)  Proair Hfa 108 (90 Base) Mcg/act Aers (Albuterol sulfate) .... Two puffs every 8 hours as needed 4)  Advair Diskus 250-50 Mcg/dose Aepb (Fluticasone-salmeterol) .... One puff daily 5)  Albuterol Sulfate (2.5 Mg/54ml) 0.083% Nebu (Albuterol sulfate) .... One neb every 6 to 8 hours as needed for wheezing 6)  Ipratropium Bromide 0.02 % Soln (Ipratropium bromide) .... One inhalationion every 6 to 8 hours as needed for wheezing, pls mix with albuterol solution  before use  Patient Instructions: 1)  Please  schedule a follow-up appointment in 3 months. 2)  Tobacco is very bad for your health and your loved ones! You Should stop smoking!. 3)  Stop Smoking Tips: Choose a Quit date. Cut down before the Quit date. decide what you will do as a substitute when you feel the urge to smoke(gum,toothpick,exercise). 4)  Pls take medications as prescribed 5)  Pls make sure she keep appts about your prostate Prescriptions: PROAIR HFA 108 (90 BASE) MCG/ACT AERS (ALBUTEROL SULFATE) two puffs every 8 hours as needed  #1 x 2   Entered and Authorized by:   Syliva Overman MD   Signed by:   Syliva Overman MD on 03/13/2010   Method used:   Electronically to        [facility] (retail)       [address]       [place], [place]  [address]       Ph: [phone]       Fax: [phone]   RxID:   [id number]    Orders Added: 1)  Est. Patient Level IV [53664] 2)  Medicare Electronic Prescription [phone]

## 2010-03-28 ENCOUNTER — Emergency Department (HOSPITAL_COMMUNITY): Payer: Medicare Other

## 2010-03-28 ENCOUNTER — Inpatient Hospital Stay (HOSPITAL_COMMUNITY)
Admission: EM | Admit: 2010-03-28 | Discharge: 2010-03-29 | DRG: 192 | Disposition: A | Discharge status: Home Health | Payer: Medicare Other | Attending: Internal Medicine | Admitting: Internal Medicine

## 2010-03-28 DIAGNOSIS — J441 Chronic obstructive pulmonary disease with (acute) exacerbation: Principal | ICD-10-CM | POA: Diagnosis present

## 2010-03-28 DIAGNOSIS — E785 Hyperlipidemia, unspecified: Secondary | ICD-10-CM | POA: Diagnosis present

## 2010-03-28 DIAGNOSIS — I10 Essential (primary) hypertension: Secondary | ICD-10-CM | POA: Diagnosis present

## 2010-03-28 DIAGNOSIS — F17200 Nicotine dependence, unspecified, uncomplicated: Secondary | ICD-10-CM | POA: Diagnosis present

## 2010-03-28 LAB — CBC
HCT: 40.6 % (ref 39.0–52.0)
MCH: 30.6 pg (ref 26.0–34.0)
MCHC: 32.5 g/dL (ref 30.0–36.0)
MCV: 94.2 fL (ref 78.0–100.0)
RDW: 13.4 % (ref 11.5–15.5)

## 2010-03-28 LAB — DIFFERENTIAL
Basophils Absolute: 0 10*3/uL (ref 0.0–0.1)
Eosinophils Relative: 1 % (ref 0–5)
Lymphocytes Relative: 57 % — ABNORMAL HIGH (ref 12–46)
Lymphs Abs: 4.9 10*3/uL — ABNORMAL HIGH (ref 0.7–4.0)
Monocytes Absolute: 0.7 10*3/uL (ref 0.1–1.0)
Monocytes Relative: 8 % (ref 3–12)

## 2010-03-28 LAB — POCT CARDIAC MARKERS: CKMB, poc: 1.8 ng/mL (ref 1.0–8.0)

## 2010-03-28 LAB — BRAIN NATRIURETIC PEPTIDE: Pro B Natriuretic peptide (BNP): 30 pg/mL (ref 0.0–100.0)

## 2010-03-28 LAB — ETHANOL: Alcohol, Ethyl (B): <5 mg/dL (ref 0–10)

## 2010-03-28 LAB — COMPREHENSIVE METABOLIC PANEL
BUN: 12 mg/dL (ref 6–23)
Calcium: 9.2 mg/dL (ref 8.4–10.5)
Creatinine, Ser: 0.87 mg/dL (ref 0.4–1.5)
GFR calc non Af Amer: >60 mL/min (ref >60)
Glucose, Bld: 153 mg/dL — ABNORMAL HIGH (ref 70–99)
Total Protein: 6.8 g/dL (ref 6.0–8.3)

## 2010-03-28 LAB — BLOOD GAS, ARTERIAL
pCO2 arterial: 49.9 mmHg — ABNORMAL HIGH (ref 35.0–45.0)
pH, Arterial: 7.347 — ABNORMAL LOW (ref 7.350–7.450)

## 2010-03-29 LAB — CBC
Hemoglobin: 11.9 g/dL — ABNORMAL LOW (ref 13.0–17.0)
Platelets: 146 10*3/uL — ABNORMAL LOW (ref 150–400)
RBC: 3.93 MIL/uL — ABNORMAL LOW (ref 4.22–5.81)
WBC: 8.2 10*3/uL (ref 4.0–10.5)

## 2010-03-29 LAB — DIFFERENTIAL
Basophils Absolute: 0 10*3/uL (ref 0.0–0.1)
Basophils Relative: 0 % (ref 0–1)
Monocytes Relative: 2 % — ABNORMAL LOW (ref 3–12)
Neutro Abs: 7.6 10*3/uL (ref 1.7–7.7)
Neutrophils Relative %: 93 % — ABNORMAL HIGH (ref 43–77)

## 2010-03-29 LAB — COMPREHENSIVE METABOLIC PANEL
ALT: 18 U/L (ref 0–53)
AST: 23 U/L (ref 0–37)
Albumin: 3.5 g/dL (ref 3.5–5.2)
CO2: 26 mEq/L (ref 19–32)
Chloride: 112 mEq/L (ref 96–112)
GFR calc Af Amer: >60 mL/min (ref >60)
GFR calc non Af Amer: >60 mL/min (ref >60)
Potassium: 4.4 mEq/L (ref 3.5–5.1)
Sodium: 143 mEq/L (ref 135–145)
Total Bilirubin: 0.4 mg/dL (ref 0.3–1.2)

## 2010-03-29 NOTE — H&P (Signed)
[name], [name]                ACCOUNT NO.:  [id number]  MEDICAL RECORD NO.:  [id number]           PATIENT TYPE:  I  LOCATION:  A301                          FACILITY:  [facility]  PHYSICIAN:  Wilson Singer, M.D.DATE OF BIRTH:  [birth year]  DATE OF ADMISSION:  03/28/2010 DATE OF DISCHARGE:  LH                             HISTORY & PHYSICAL   CHIEF COMPLAINT:  Dyspnea.  HISTORY OF PRESENTING ILLNESS:  This is a 60 year old man who is known to this hospital with several admissions recently, presents with acute dyspnea early this morning approximately 5 hours ago.  He was in the kitchen, cooking some eggs and the smoke from his cooker made him suddenly become short of breath.  He got to the emergency room at 11:30 a.m. this morning and was given Solu-Medrol intravenously at 11:38 a.m. During his time which is approximately 4 hours later now, he has improved significantly with a combination of intravenous steroids, nebulizers.  He feels back to his normal self but in view of his past medical history of being intubated and mechanically ventilated several times, the emergency room physicians felt prudent that he should be admitted.  I agree with this assessment.  Unfortunately, he still continues to smoke cigarettes, 3-4 cigarettes per day.  PAST MEDICAL HISTORY:  Significant for asthma, COPD, hypertension, hypercholesterolemia, stroke in 2008, causing diplopia, also history of alcoholism and polysubstance drug abuse, currently status not clear.  ALLERGIES:  No known drug allergies.  MEDICATIONS: 1. Advair Diskus 500/50 two puffs b.i.d. 2. Triamterene/HCTZ 37.5/25, 1/2 tablet daily. 3. Simvastatin 40 mg at bedtime. 4. Albuterol inhaler as required every 6 hours.  SOCIAL HISTORY:  Continues to smoke as mentioned above.  FAMILY HISTORY:  Noncontributory.  REVIEW OF SYSTEMS:  Apart from symptoms mentioned above, there are no other symptoms referable to all systems  reviewed.  PHYSICAL EXAMINATION:  VITAL SIGNS:  Temperature 98, blood pressure 135/77, pulse 80, respiratory rate 12-14.  Interestingly, his pulse was 143 on admission.  He is oxygenating 99% on 3 liters of oxygen.  There is no peripheral or central cyanosis.  There is no increased work of breathing at the present time. CARDIOVASCULAR:  Heart sounds are present and normal without murmurs. CHEST:  Lung fields are entirely clear with reduced air sounds bilaterally, but no wheezing. ABDOMEN:  Soft and nontender with no hepatosplenomegaly.  There are no masses. NEUROLOGIC:  He is alert and oriented without any focal neurologic signs. SKIN:  There are no other skin lesions or rashes.  INVESTIGATIONS:  Arterial blood gas on 3 liters of oxygen shows pH of 7.35, PCO2 of 49.9, PO2 of 108, saturation 98.8%.  BNP 30.  Sodium 142, potassium 4.1, bicarbonate 29, BUN 12, creatinine 0.87.  Liver enzymes within normal limits.  Hemoglobin 13.2, white blood cell count 8.6, platelets 193.  Cardiac marker was also done which was negative.  Chest x-ray does not show any acute pneumonia or any acute findings.  PROBLEM LIST: 1. Acute asthma. 2. Underlying chronic obstructive pulmonary disease. 3. Hypertension. 4. Tobacco abuse. 5. Hypercholesterolemia.  PLAN: 1. Admit. 2. Intravenous steroids and nebulizers.  3. No need for antibiotics at this time.  I would probably anticipate that he would require less than a 24-hour stay, but we will monitor him to make sure he does not decompensate. Further recommendations will depend on hospital progress.     Wilson Singer, M.D.     NCG/MEDQ  D:  03/28/2010  T:  03/28/2010  Job:  [id number]  Electronically Signed by Lilly Cove M.D. on 03/29/2010 57:84:69 PM

## 2010-03-31 NOTE — Discharge Summary (Signed)
  [name], [name]                ACCOUNT NO.:  [id number]  MEDICAL RECORD NO.:  [id number]           PATIENT TYPE:  I  LOCATION:  A301                          FACILITY:  [facility]  PHYSICIAN:  Wilson Singer, M.D.DATE OF BIRTH:  August 12, 1950  DATE OF ADMISSION:  03/28/2010 DATE OF DISCHARGE:  03/22/2012LH                              DISCHARGE SUMMARY   FINAL DISCHARGE DIAGNOSES: 1. Asthma exacerbation. 2. Chronic obstructive pulmonary disease. 3. Hypertension. 4. Tobacco abuse. 5. Hyperlipidemia.  CONDITION ON DISCHARGE:  Stable.  MEDICATIONS ON DISCHARGE:  Continue home medications which include; 1. Advair Diskus inhaler 500/50 two puffs b.i.d. 2. Triamterene/hydrochlorothiazide 37.5/25 half tablet daily. 3. Simvastatin 40 mg nightly.  In addition, he will have Atrovent     inhaler two puffs q.i.d. p.r.n. 4. Prednisone 20 mg daily for 7 days and then stop.  HISTORY:  This 60 year old man was admitted again for dyspnea.  Please see initial history and physical examination done by Dr. Karilyn Cota.  HOSPITAL PROGRESS:  When he was admitted yesterday, he was actually mostly back to his baseline approximately 4 hours after being in the hospital, in the emergency room.  He has been stable overnight and is back to his baseline which is episodes of dyspnea on some exertion, but able to tolerate.  I think that he told me he had run out of his Advair Diskus and I wonder if he has not been very compliant with his inhalers. I have encouraged him to do so and especially to follow up with his primary care physician.  Today, he looks stable.  PHYSICAL EXAMINATION:  VITAL SIGNS:  Temperature 97.5, blood pressure 121/71, pulse 83, saturation 97% on room air. CHEST:  There is no increased work of breathing.  There is no peripheral central cyanosis.  There is no use of accessory muscles of respiration. CARDIOVASCULAR:  Heart sounds are present and normal without murmurs. Jugular venous  pressure is not raised.  He is clinically not in heart failure. RESPIRATORY:  Lung fields show very occasional scattered wheezing, but his chest did not feel tight.  There are no crackles or bronchial breathing.  Investigations today show sodium of 143, potassium 4.4, bicarbonate 26, BUN 9, creatinine 0.78.  Hemoglobin 11.9, white blood cell count normal at 8.2, platelets 146.  DISPOSITION:  This patient is now stable to be discharged home with a course of oral prednisone for 1 week and then to discontinue.  I have encouraged him to use his inhalers especially the Advair Diskus on a regular basis and to follow up with his primary care physician in the next 1 or 2 weeks, Dr. Lodema Hong who would reinforce the use of inhaled steroids to prevent further hospitalizations.  Also, I counseled him again about tobacco sensation and he will try and do better.     Wilson Singer, M.D.     NCG/MEDQ  D:  03/29/2010  T:  03/29/2010  Job:  [id number]  cc:   Milus Mallick. Lodema Hong, M.D. Fax: [phone]  Oneal Deputy. Juanetta Gosling, M.D. Fax: [phone]  Electronically Signed by Lilly Cove M.D. on 03/31/2010 05:35:29 PM

## 2010-04-09 LAB — DIFFERENTIAL
Basophils Absolute: 0.1 10*3/uL (ref 0.0–0.1)
Basophils Relative: 1 % (ref 0–1)
Eosinophils Absolute: 0.2 10*3/uL (ref 0.0–0.7)
Eosinophils Relative: 4 % (ref 0–5)
Monocytes Absolute: 0.4 10*3/uL (ref 0.1–1.0)
Monocytes Relative: 7 % (ref 3–12)
Neutro Abs: 2.4 10*3/uL (ref 1.7–7.7)

## 2010-04-09 LAB — BLOOD GAS, ARTERIAL
Acid-Base Excess: 1.2 mmol/L (ref 0.0–2.0)
Bicarbonate: 27.2 mEq/L — ABNORMAL HIGH (ref 20.0–24.0)
O2 Saturation: 97.9 %

## 2010-04-09 LAB — BASIC METABOLIC PANEL
CO2: 29 mEq/L (ref 19–32)
Calcium: 8.4 mg/dL (ref 8.4–10.5)
Chloride: 103 mEq/L (ref 96–112)
Creatinine, Ser: 0.86 mg/dL (ref 0.4–1.5)
GFR calc Af Amer: >60 mL/min (ref >60)
Glucose, Bld: 116 mg/dL — ABNORMAL HIGH (ref 70–99)
Sodium: 137 mEq/L (ref 135–145)

## 2010-04-09 LAB — CBC
Hemoglobin: 13.3 g/dL (ref 13.0–17.0)
MCHC: 33.3 g/dL (ref 30.0–36.0)
MCV: 93.5 fL (ref 78.0–100.0)
RBC: 4.28 MIL/uL (ref 4.22–5.81)
RDW: 13.3 % (ref 11.5–15.5)

## 2010-04-11 ENCOUNTER — Encounter: Payer: Self-pay | Admitting: Family Medicine

## 2010-04-16 ENCOUNTER — Encounter: Payer: Self-pay | Admitting: Family Medicine

## 2010-04-17 ENCOUNTER — Encounter: Payer: Self-pay | Admitting: Family Medicine

## 2010-04-17 ENCOUNTER — Ambulatory Visit (INDEPENDENT_AMBULATORY_CARE_PROVIDER_SITE_OTHER): Payer: Medicare Other | Admitting: Family Medicine

## 2010-04-17 VITALS — BP 140/80 | HR 82 | Resp 16 | Ht 69.5 in | Wt 132.1 lb

## 2010-04-17 DIAGNOSIS — J449 Chronic obstructive pulmonary disease, unspecified: Secondary | ICD-10-CM

## 2010-04-17 DIAGNOSIS — J45909 Unspecified asthma, uncomplicated: Secondary | ICD-10-CM

## 2010-04-17 DIAGNOSIS — E785 Hyperlipidemia, unspecified: Secondary | ICD-10-CM

## 2010-04-17 DIAGNOSIS — F17200 Nicotine dependence, unspecified, uncomplicated: Secondary | ICD-10-CM

## 2010-04-17 DIAGNOSIS — I10 Essential (primary) hypertension: Secondary | ICD-10-CM

## 2010-04-17 MED ORDER — ALBUTEROL SULFATE HFA 108 (90 BASE) MCG/ACT IN AERS: 2.0000 | INHALATION_SPRAY | Freq: Four times a day (QID) | RESPIRATORY_TRACT | Status: DC | PRN

## 2010-04-17 MED ORDER — FLUTICASONE-SALMETEROL 500-50 MCG/DOSE IN AEPB: 1.0000 | INHALATION_SPRAY | Freq: Two times a day (BID) | RESPIRATORY_TRACT | Status: DC

## 2010-04-17 MED ORDER — TRIAMTERENE-HCTZ 37.5-25 MG PO TABS: 1.0000 | ORAL_TABLET | Freq: Every day | ORAL | Status: DC

## 2010-04-17 NOTE — Patient Instructions (Signed)
F/u in 4 months.  Pls use your medication as prescribed, I have sent the medication in  No med changes at this time.  Fasting lipid, hepatic and chem 7 in 4 months,   Please think about quitting smoking.  This is very important for your health.  Consider setting a quit date, then cutting back or switching brands to prepare to stop.  Also think of the money you will save every day by not smoking.  Quick Tips to Quit Smoking: Fix a date i.e. keep a date in mind from when you would not touch a tobacco product to smoke  Keep yourself busy and block your mind with work loads or reading books or watching movies in malls where smoking is not allowed  Vanish off the things which reminds you about smoking for example match box, or your favorite lighter, or the pipe you used for smoking, or your favorite jeans and shirt with which you used to enjoy smoking, or the club where you used to do smoking  Try to avoid certain people places and incidences where and with whom smoking is a common factor to add on  Praise yourself with some token gifts from the money you saved by stopping smoking  Anti Smoking teams are there to help you. Join their programs  Anti-smoking Gums are there in many medical shops. Try them to quit smoking   Side-effects of Smoking: Disease caused by smoking cigarettes are emphysema, bronchitis, heart failures  Premature death  Cancer is the major side effect of smoking  Heart attacks and strokes are the quick effects of smoking causing sudden death  Some smokers lives end up with limbs amputated  Breathing problem or fast breathing is another side effect of smoking  Due to more intakes of smokes, carbon mono-oxide goes into your brain and other muscles of the body which leads to swelling of the veins and blockage to the air passage to lungs  Carbon monoxide blocks blood vessels which leads to blockage in the flow of blood to different major body organs like heart lungs and thus  leads to attacks and deaths  During pregnancy smoking is very harmful and leads to premature birth of the infant, spontaneous abortions, low weight of the infant during birth  Fat depositions to narrow and blocked blood vessels causing heart attacks  In many cases cigarette smoking caused infertility in men

## 2010-04-23 ENCOUNTER — Emergency Department (HOSPITAL_COMMUNITY): Payer: Medicare Other

## 2010-04-23 ENCOUNTER — Inpatient Hospital Stay (HOSPITAL_COMMUNITY)
Admission: AD | Admit: 2010-04-23 | Discharge: 2010-04-28 | DRG: 208 | Disposition: A | Discharge status: Home Health | Payer: Medicare Other | Source: Ambulatory Visit | Attending: Internal Medicine | Admitting: Internal Medicine

## 2010-04-23 DIAGNOSIS — I10 Essential (primary) hypertension: Secondary | ICD-10-CM | POA: Diagnosis present

## 2010-04-23 DIAGNOSIS — J441 Chronic obstructive pulmonary disease with (acute) exacerbation: Secondary | ICD-10-CM | POA: Diagnosis present

## 2010-04-23 DIAGNOSIS — J9600 Acute respiratory failure, unspecified whether with hypoxia or hypercapnia: Principal | ICD-10-CM | POA: Diagnosis present

## 2010-04-23 DIAGNOSIS — F17200 Nicotine dependence, unspecified, uncomplicated: Secondary | ICD-10-CM | POA: Diagnosis present

## 2010-04-24 ENCOUNTER — Inpatient Hospital Stay (HOSPITAL_COMMUNITY): Payer: Medicare Other

## 2010-04-24 ENCOUNTER — Emergency Department (HOSPITAL_COMMUNITY): Payer: Medicare Other

## 2010-04-24 DIAGNOSIS — Z9981 Dependence on supplemental oxygen: Secondary | ICD-10-CM

## 2010-04-24 DIAGNOSIS — J449 Chronic obstructive pulmonary disease, unspecified: Secondary | ICD-10-CM

## 2010-04-24 DIAGNOSIS — Z87891 Personal history of nicotine dependence: Secondary | ICD-10-CM

## 2010-04-24 DIAGNOSIS — I10 Essential (primary) hypertension: Secondary | ICD-10-CM

## 2010-04-24 LAB — CBC
MCHC: 32 g/dL (ref 30.0–36.0)
MCHC: 32.2 g/dL (ref 30.0–36.0)
RDW: 13.1 % (ref 11.5–15.5)
RDW: 13.4 % (ref 11.5–15.5)
WBC: 8.6 10*3/uL (ref 4.0–10.5)

## 2010-04-24 LAB — DIFFERENTIAL
Basophils Absolute: 0 10*3/uL (ref 0.0–0.1)
Basophils Absolute: 0 10*3/uL (ref 0.0–0.1)
Basophils Relative: 0 % (ref 0–1)
Basophils Relative: 0 % (ref 0–1)
Eosinophils Relative: 2 % (ref 0–5)
Eosinophils Relative: 0 % (ref 0–5)
Monocytes Absolute: 0.3 10*3/uL (ref 0.1–1.0)
Monocytes Absolute: 0.2 10*3/uL (ref 0.1–1.0)
Neutro Abs: 4.1 10*3/uL (ref 1.7–7.7)

## 2010-04-24 LAB — GLUCOSE, CAPILLARY
Glucose-Capillary: 168 mg/dL — ABNORMAL HIGH (ref 70–99)
Glucose-Capillary: 149 mg/dL — ABNORMAL HIGH (ref 70–99)

## 2010-04-24 LAB — BLOOD GAS, ARTERIAL
Acid-base deficit: 3.1 mmol/L — ABNORMAL HIGH (ref 0.0–2.0)
Bicarbonate: 21.6 mEq/L (ref 20.0–24.0)
Bicarbonate: 24.5 mEq/L — ABNORMAL HIGH (ref 20.0–24.0)
FIO2: 30 %
MECHVT: 500 mL
MECHVT: 500 mL
O2 Content: 40 L/min
O2 Saturation: 98.8 %
O2 Saturation: 99.2 %
O2 Saturation: 99.3 %
PEEP: 5 cmH2O
Patient temperature: 37
Patient temperature: 37
Patient temperature: 37
RATE: 20 resp/min
RATE: 14 resp/min
pH, Arterial: 7.271 — ABNORMAL LOW (ref 7.350–7.450)
pO2, Arterial: 178 mmHg — ABNORMAL HIGH (ref 80.0–100.0)

## 2010-04-24 LAB — HEPATIC FUNCTION PANEL
ALT: 16 U/L (ref 0–53)
AST: 22 U/L (ref 0–37)
Bilirubin, Direct: 0.1 mg/dL (ref 0.0–0.3)
Indirect Bilirubin: 0.3 mg/dL (ref 0.3–0.9)
Total Bilirubin: 0.4 mg/dL (ref 0.3–1.2)

## 2010-04-24 LAB — RAPID URINE DRUG SCREEN, HOSP PERFORMED
Barbiturates: NOT DETECTED
Cocaine: NOT DETECTED
Opiates: NOT DETECTED

## 2010-04-24 LAB — HEMOGLOBIN A1C: Mean Plasma Glucose: 123 mg/dL — ABNORMAL HIGH (ref <117)

## 2010-04-24 LAB — COMPREHENSIVE METABOLIC PANEL
ALT: 17 U/L (ref 0–53)
AST: 27 U/L (ref 0–37)
Calcium: 8.4 mg/dL (ref 8.4–10.5)
GFR calc Af Amer: >60 mL/min (ref >60)
Glucose, Bld: 128 mg/dL — ABNORMAL HIGH (ref 70–99)
Sodium: 141 mEq/L (ref 135–145)
Total Protein: 5.8 g/dL — ABNORMAL LOW (ref 6.0–8.3)

## 2010-04-24 LAB — URINALYSIS, ROUTINE W REFLEX MICROSCOPIC
Bilirubin Urine: NEGATIVE
Glucose, UA: NEGATIVE mg/dL
Ketones, ur: NEGATIVE mg/dL
Leukocytes, UA: NEGATIVE
pH: 5.5 (ref 5.0–8.0)

## 2010-04-24 LAB — URINE MICROSCOPIC-ADD ON

## 2010-04-24 LAB — BASIC METABOLIC PANEL
Calcium: 8.7 mg/dL (ref 8.4–10.5)
GFR calc Af Amer: >60 mL/min (ref >60)
GFR calc non Af Amer: >60 mL/min (ref >60)
Sodium: 141 mEq/L (ref 135–145)

## 2010-04-24 LAB — POCT CARDIAC MARKERS: CKMB, poc: 4 ng/mL (ref 1.0–8.0)

## 2010-04-24 LAB — CHOLESTEROL, TOTAL: Cholesterol: 179 mg/dL (ref 0–200)

## 2010-04-25 ENCOUNTER — Inpatient Hospital Stay (HOSPITAL_COMMUNITY): Payer: Medicare Other

## 2010-04-25 LAB — GLUCOSE, CAPILLARY
Glucose-Capillary: 144 mg/dL — ABNORMAL HIGH (ref 70–99)
Glucose-Capillary: 151 mg/dL — ABNORMAL HIGH (ref 70–99)

## 2010-04-25 LAB — DIFFERENTIAL
Basophils Relative: 0 % (ref 0–1)
Monocytes Absolute: 0.4 10*3/uL (ref 0.1–1.0)
Monocytes Relative: 5 % (ref 3–12)
Neutro Abs: 6.5 10*3/uL (ref 1.7–7.7)
Neutrophils Relative %: 88 % — ABNORMAL HIGH (ref 43–77)

## 2010-04-25 LAB — BLOOD GAS, ARTERIAL
Acid-Base Excess: 1.3 mmol/L (ref 0.0–2.0)
Acid-base deficit: 1.1 mmol/L (ref 0.0–2.0)
Bicarbonate: 22.6 mEq/L (ref 20.0–24.0)
Bicarbonate: 25.8 mEq/L — ABNORMAL HIGH (ref 20.0–24.0)
FIO2: 30 %
O2 Content: 35 L/min
O2 Saturation: 99.2 %
O2 Saturation: 95.8 %
PEEP: 5 cmH2O
Patient temperature: 37
Patient temperature: 37
TCO2: 20.6 mmol/L (ref 0–100)
pO2, Arterial: 148 mmHg — ABNORMAL HIGH (ref 80.0–100.0)

## 2010-04-25 LAB — COMPREHENSIVE METABOLIC PANEL
AST: 24 U/L (ref 0–37)
CO2: 23 mEq/L (ref 19–32)
Calcium: 8.6 mg/dL (ref 8.4–10.5)
Creatinine, Ser: 0.86 mg/dL (ref 0.4–1.5)
GFR calc Af Amer: >60 mL/min (ref >60)
GFR calc non Af Amer: >60 mL/min (ref >60)
Glucose, Bld: 139 mg/dL — ABNORMAL HIGH (ref 70–99)

## 2010-04-25 LAB — CBC
Hemoglobin: 11.2 g/dL — ABNORMAL LOW (ref 13.0–17.0)
MCH: 30.7 pg (ref 26.0–34.0)
MCHC: 32.5 g/dL (ref 30.0–36.0)

## 2010-04-26 DIAGNOSIS — J441 Chronic obstructive pulmonary disease with (acute) exacerbation: Secondary | ICD-10-CM

## 2010-04-26 DIAGNOSIS — I10 Essential (primary) hypertension: Secondary | ICD-10-CM

## 2010-04-26 DIAGNOSIS — N179 Acute kidney failure, unspecified: Secondary | ICD-10-CM

## 2010-04-26 DIAGNOSIS — F1220 Cannabis dependence, uncomplicated: Secondary | ICD-10-CM

## 2010-04-26 DIAGNOSIS — F17200 Nicotine dependence, unspecified, uncomplicated: Secondary | ICD-10-CM

## 2010-04-26 LAB — BASIC METABOLIC PANEL
CO2: 27 mEq/L (ref 19–32)
Calcium: 8.4 mg/dL (ref 8.4–10.5)
GFR calc Af Amer: >60 mL/min (ref >60)
GFR calc non Af Amer: >60 mL/min (ref >60)
Sodium: 149 mEq/L — ABNORMAL HIGH (ref 135–145)

## 2010-04-26 LAB — BLOOD GAS, ARTERIAL
Acid-Base Excess: 1.2 mmol/L (ref 0.0–2.0)
Bicarbonate: 25.9 mEq/L — ABNORMAL HIGH (ref 20.0–24.0)
FIO2: 0.3 %
Mode: POSITIVE
O2 Saturation: 92.8 %
Patient temperature: 37
TCO2: 23.8 mmol/L (ref 0–100)
pH, Arterial: 7.371 (ref 7.350–7.450)

## 2010-04-26 LAB — GLUCOSE, CAPILLARY
Glucose-Capillary: 128 mg/dL — ABNORMAL HIGH (ref 70–99)
Glucose-Capillary: 128 mg/dL — ABNORMAL HIGH (ref 70–99)

## 2010-04-26 NOTE — Group Therapy Note (Signed)
  [name], [name]                ACCOUNT NO.:  [id number]  MEDICAL RECORD NO.:  [id number]           PATIENT TYPE:  I  LOCATION:  IC08                          FACILITY:  [facility]  PHYSICIAN:  Wilson Singer, M.D.DATE OF BIRTH:  09/26/1950  DATE OF PROCEDURE:  04/26/2010 DATE OF DISCHARGE:                                PROGRESS NOTE   This man was tried to be weaned yesterday but this was unsuccessful. Today he is much more alert and hopefully we can wean him today.  He remains on the ventilator.  PHYSICAL EXAMINATION:  VITAL SIGNS:  Temperature 97.6, blood pressure 115/63, pulse 72, saturation 97% with an FiO2 of 30%. CARDIAC:  Heart sounds are present and normal. CHEST:  Lung fields actually are clear without any wheezing. NEUROLOGIC:  He is alert and oriented and follows commands very appropriately.  He moves all his limbs appropriately and there is no evidence whatsoever of stroke.  When he presented initially, he had altered mental status.  A CT brain scan was done and this had shown the possibility of early changes of an acute stroke in the left temporoparietal region.  I am not convinced of this clinically.  Lab work today shows sodium 149, potassium 3.8, bicarbonate 27, BUN 16, creatinine 0.83.  IMPRESSION:  Chronic obstructive pulmonary disease exacerbation, ventilator-dependent respiratory failure.  PLAN: 1. Wean off ventilator today per pulmonary.  We will continue with all     the steroids as is and follow him once he is off the ventilator.     Wilson Singer, M.D.     NCG/MEDQ  D:  04/26/2010  T:  04/26/2010  Job:  [id number]  Electronically Signed by Lilly Cove M.D. on 04/26/2010 12:59:15 PM

## 2010-04-27 LAB — CBC
HCT: 33.5 % — ABNORMAL LOW (ref 39.0–52.0)
Hemoglobin: 10.7 g/dL — ABNORMAL LOW (ref 13.0–17.0)
MCH: 30.5 pg (ref 26.0–34.0)
MCHC: 31.9 g/dL (ref 30.0–36.0)
RBC: 3.51 MIL/uL — ABNORMAL LOW (ref 4.22–5.81)

## 2010-04-27 LAB — GLUCOSE, CAPILLARY: Glucose-Capillary: 110 mg/dL — ABNORMAL HIGH (ref 70–99)

## 2010-04-27 LAB — DIFFERENTIAL
Basophils Relative: 0 % (ref 0–1)
Lymphocytes Relative: 4 % — ABNORMAL LOW (ref 12–46)
Monocytes Absolute: 0.3 10*3/uL (ref 0.1–1.0)
Monocytes Relative: 3 % (ref 3–12)
Neutro Abs: 9.2 10*3/uL — ABNORMAL HIGH (ref 1.7–7.7)
Neutrophils Relative %: 93 % — ABNORMAL HIGH (ref 43–77)

## 2010-04-27 LAB — PROTIME-INR
INR: 1.15 (ref 0.00–1.49)
Prothrombin Time: 14.9 seconds (ref 11.6–15.2)

## 2010-04-27 LAB — BASIC METABOLIC PANEL
CO2: 29 mEq/L (ref 19–32)
Chloride: 110 mEq/L (ref 96–112)
GFR calc Af Amer: >60 mL/min (ref >60)
Potassium: 3.9 mEq/L (ref 3.5–5.1)
Sodium: 143 mEq/L (ref 135–145)

## 2010-04-27 NOTE — Group Therapy Note (Signed)
  NAME:  [name], [name]                ACCOUNT NO.:  [id number]  MEDICAL RECORD NO.:  [id number]           PATIENT TYPE:  I  LOCATION:  IC08                          FACILITY:  [facility]  PHYSICIAN:  Wilson Singer, M.D.DATE OF BIRTH:  22-Dec-1950  DATE OF PROCEDURE:  04/27/2010 DATE OF DISCHARGE:                                PROGRESS NOTE   This man was extubated yesterday at noon at 8:30 in the morning which is almost 24 hours ago and he has done well.  He is maintaining on 3 liters of oxygen and saturating at 97% or so.  He says he feels well.  PHYSICAL EXAMINATION:  VITAL SIGNS:  Temperature 98.4, blood pressure 138/71, pulse 87, saturation 97% on 3 liters oxygen. CARDIAC:  Heart sounds are present and normal without murmurs. CHEST:  Lung fields are clear except for some scattered wheezing. NEUROLOGIC:  He is alert and oriented.  There was no evidence of any focal neurological signs.  Investigations today show sodium of 143, potassium 3.9, bicarbonate 29, BUN 11, creatinine 0.73, hemoglobin 10.7, white blood cell count 9.8, platelets 125,000.  IMPRESSION:  Chronic obstructive pulmonary exacerbation with rapid deterioration.  PLAN: 1. Discontinue intravenous steroids and convert to oral steroids. 2. Reduce IV fluids. 3. Discontinue Foley catheter. 4. Moved to regular floor.  I am hoping we can discharge this man     soon.  I have discussed this case with Dr. Juanetta Gosling and his repeated     episodes of decompensation and needing mechanical ventilation.  I     wonder if he would benefit from chronic steroid use at this point     to prevent further such rapid decompensations.     Wilson Singer, M.D.     NCG/MEDQ  D:  04/27/2010  T:  04/27/2010  Job:  [id number]  Electronically Signed by Lilly Cove M.D. on 04/27/2010 01:11:59 PM

## 2010-04-28 LAB — CBC
MCH: 30.5 pg (ref 26.0–34.0)
MCHC: 32.7 g/dL (ref 30.0–36.0)
Platelets: 123 10*3/uL — ABNORMAL LOW (ref 150–400)
RDW: 13 % (ref 11.5–15.5)

## 2010-04-28 LAB — BASIC METABOLIC PANEL
BUN: 8 mg/dL (ref 6–23)
Calcium: 8.7 mg/dL (ref 8.4–10.5)
Creatinine, Ser: 0.73 mg/dL (ref 0.4–1.5)
GFR calc Af Amer: >60 mL/min (ref >60)
GFR calc non Af Amer: >60 mL/min (ref >60)

## 2010-04-28 LAB — DIFFERENTIAL
Basophils Absolute: 0 10*3/uL (ref 0.0–0.1)
Basophils Relative: 0 % (ref 0–1)
Eosinophils Absolute: 0 10*3/uL (ref 0.0–0.7)
Monocytes Absolute: 0.5 10*3/uL (ref 0.1–1.0)
Monocytes Relative: 7 % (ref 3–12)
Neutro Abs: 5.4 10*3/uL (ref 1.7–7.7)

## 2010-04-28 LAB — APTT: aPTT: 27 seconds (ref 24–37)

## 2010-04-29 NOTE — Discharge Summary (Signed)
  [name], [name]                ACCOUNT NO.:  [id number]  MEDICAL RECORD NO.:  [id number]           PATIENT TYPE:  I  LOCATION:  A335                          FACILITY:  [facility]  PHYSICIAN:  Wilson Singer, M.D.DATE OF BIRTH:  October 31, 1950  DATE OF ADMISSION:  04/23/2010 DATE OF DISCHARGE:  04/21/2012LH                              DISCHARGE SUMMARY   FINAL DISCHARGE DIAGNOSES: 1. Ventilator-dependent respiratory failure secondary to chronic     obstructive pulmonary disease exacerbation and acute respiratory     failure. 2. Tobacco abuse. 3. Hypertension.  CONDITION ON DISCHARGE:  Stable.  MEDICATIONS ON DISCHARGE: 1. Levaquin 500 mg daily for one further week. 2. Prednisone 10 mg daily and to continue on this as a maintenance     dose. 3. Albuterol inhaler 2 puffs every 6 hours p.r.n. 4. Atrovent inhaler 2 puffs q.i.d. p.r.n. 5. Advair inhaler 500/50 one to two puffs twice a day. 6. Simvastatin 40 mg daily. 7. Triamterene/hydrochlorothiazide 37.5/25 half a tablet daily.  HISTORY:  This 60 year old man was admitted once again with respiratory failure secondary to his COPD.  Please see initial history and physical examination done by Dr. [name].  HOSPITAL PROGRESS:  The patient was put on intravenous steroids and antibiotics and was seen by Dr. Juanetta Gosling.  The patient soon decompensated rather rapidly and he was intubated and mechanically ventilated.  He was stable to be safely extubated 2 days ago and has remained off the ventilator and has done well.  He says his breathing is back to his baseline.  On physical examination today, temperature 98, blood pressure 149/82, pulse 79, saturation 96% on 2 L oxygen.  Heart sounds are present and normal without murmurs.  Lung fields are entirely clear without any wheezing, crackles, or bronchial breathing.  Investigations today show sodium of 142, potassium 3.3, bicarbonate 35, BUN 8, creatinine 0.73, hemoglobin 11.0,  white blood cell count 7, platelets 123.  Chest x-ray that was done 3 days ago shows no active disease such as pneumonia but only findings consistent with COPD.  DISPOSITION:  The patient's potassium will be repleted prior to discharge home today and I think he is stable to be discharged.  I have discussed this case at length with Dr. Juanetta Gosling and we both feel that he would benefit from chronic steroid use as he seems to decompensate very rapidly and often has to come in and be mechanically ventilated.  Hopefully, this will work for him.  He will see Dr. Juanetta Gosling in the next 1-2 weeks and follow up with his primary care physician thereafter.     Wilson Singer, M.D.     NCG/MEDQ  D:  04/28/2010  T:  04/28/2010  Job:  [id number]  cc:   Ramon Dredge L. Juanetta Gosling, M.D. Fax: [phone]  Milus Mallick. Lodema Hong, M.D. Fax: [phone]  Electronically Signed by Lilly Cove M.D. on 04/29/2010 09:42:26 AM

## 2010-05-02 ENCOUNTER — Telehealth: Payer: Self-pay | Admitting: Family Medicine

## 2010-05-02 ENCOUNTER — Other Ambulatory Visit (INDEPENDENT_AMBULATORY_CARE_PROVIDER_SITE_OTHER): Payer: Medicare Other

## 2010-05-02 DIAGNOSIS — J449 Chronic obstructive pulmonary disease, unspecified: Secondary | ICD-10-CM

## 2010-05-02 MED ORDER — ALBUTEROL SULFATE HFA 108 (90 BASE) MCG/ACT IN AERS: 2.0000 | INHALATION_SPRAY | Freq: Four times a day (QID) | RESPIRATORY_TRACT | Status: DC | PRN

## 2010-05-02 NOTE — Telephone Encounter (Signed)
Sent the inhalers to [facility]

## 2010-05-03 ENCOUNTER — Ambulatory Visit: Payer: Medicare Other | Admitting: Family Medicine

## 2010-05-04 ENCOUNTER — Telehealth: Payer: Self-pay | Admitting: Family Medicine

## 2010-05-04 NOTE — Telephone Encounter (Signed)
Advised ER. Patient could not afford the inhaler sent to [facility] so I sent it to mail order for him but he has not received it yet because it can take 2 weeks.

## 2010-05-04 NOTE — Telephone Encounter (Signed)
If pt is in respiratory distress he should go to the ED , the message is the same, he choses to refuse

## 2010-05-06 ENCOUNTER — Encounter: Payer: Self-pay | Admitting: Family Medicine

## 2010-05-06 NOTE — Group Therapy Note (Signed)
  [name], [name]                ACCOUNT NO.:  [id number]  MEDICAL RECORD NO.:  [id number]           PATIENT TYPE:  I  LOCATION:  IC08                          FACILITY:  [facility]  PHYSICIAN:  Breleigh Carpino L. Juanetta Gosling, M.D.DATE OF BIRTH:  August 15, 1950  DATE OF PROCEDURE: DATE OF DISCHARGE:                                PROGRESS NOTE   Mr. Manalang is admitted to the [facility] team 2.  We attempted to wean him yesterday, but he failed.  This morning, he is more awake and alert.  I think he has probably got more opportunity to wean.  PHYSICAL EXAMINATION:  GENERAL:  As mentioned, he is more awake and alert. VITAL SIGNS:  His blood pressure is in the 130s, pulse in the 60s. CHEST:  Relatively clear with some decreased breath sounds and end- expiratory wheeze. HEART:  Regular without gallop. ABDOMEN:  Soft without masses.  ASSESSMENT:  I think he is better.  PLAN:  To see if he can be set for weaning today.  I think he has more opportunity.     Allyn Bertoni L. Juanetta Gosling, M.D.     ELH/MEDQ  D:  04/26/2010  T:  04/26/2010  Job:  [id number]  Electronically Signed by Kari Baars M.D. on 05/06/2010 02:37:58 PM

## 2010-05-06 NOTE — Group Therapy Note (Signed)
  NAMEJERZY, ROEPKE                ACCOUNT NO.:  [id number]  MEDICAL RECORD NO.:  [id number]           PATIENT TYPE:  I  LOCATION:  IC08                          FACILITY:  [facility]  PHYSICIAN:  Bernece Gall L. Juanetta Gosling, M.D.DATE OF BIRTH:  1950-01-12  DATE OF PROCEDURE: DATE OF DISCHARGE:                                PROGRESS NOTE   Mr. Capozzi is a patient of [facility].  He was admitted with respiratory failure and intubated.  He is still sedated, but arousable.  He has no new problems noted through the night.  His physical examination shows that he is awake and alert, sedated.  He does respond to questions.  His pulse is in the 70s, blood pressure 111/50.  His chest is clear, O2 sats 99%, respirations about 20.  He has somewhat decreased breath sounds and slightly prolonged expiration.  His heart is regular without gallop.  His abdomen is soft.  His laboratory work; common metabolic profile shows his glucose is 139, albumin is 3.4.  CBC shows white count 7500, hemoglobin 11.2, platelets 130 and his blood gas on 35% 500 rate of 20 shows a pH of 7.43, pCO2 of 34 and pO2 of 148.  ASSESSMENT:  He has respiratory failure.  He has a long known history of asthma/chronic obstructive pulmonary disease.  It is not quite clear why he went into respiratory failure on this occasion.  He seemed to have a fairly sudden onset of problems which has been his pattern and my plan then is to see if he can wean today.  If he is not able to wean, we will need to start some nutritional support.     Sheneika Walstad L. Juanetta Gosling, M.D.     ELH/MEDQ  D:  04/25/2010  T:  04/25/2010  Job:  [id number]  Electronically Signed by Kari Baars M.D. on 05/06/2010 02:37:51 PM

## 2010-05-06 NOTE — Group Therapy Note (Signed)
  NAMESHAYNE, DIGUGLIELMO                ACCOUNT NO.:  [id number]  MEDICAL RECORD NO.:  [id number]           PATIENT TYPE:  LOCATION:                                 FACILITY:  PHYSICIAN:  Tyrik Stetzer L. Juanetta Gosling, M.D.DATE OF BIRTH:  04-Oct-1950  DATE OF PROCEDURE: DATE OF DISCHARGE:                                PROGRESS NOTE   Patient of the inpatient hospitalist team 2.  Mr. Stcyr was able to be successfully extubated yesterday and he has done well through the night. He has no complaints this morning and says that he feels well.  He looks comfortable.  His chest is very clear with decreased breath sounds and minimal end-expiratory wheeze.  Pulses in the 60s, blood pressure 133/83, O2 sats in the 90s on nasal cannula.  ASSESSMENT:  He has chronic obstructive pulmonary disease with acute respiratory failure and this is a recurrent situation.  I discussed his situation with Dr. Karilyn Cota, hospitalist attending, and we both feel that at this point he should perhaps stay on oral steroids and see if this would allow him to keep from having these episodes recurrently.  He is going to move from the ICU today.     Hazelynn Mckenny L. Juanetta Gosling, M.D.     ELH/MEDQ  D:  04/27/2010  T:  04/27/2010  Job:  [id number]  Electronically Signed by Kari Baars M.D. on 05/06/2010 02:38:02 PM

## 2010-05-06 NOTE — Assessment & Plan Note (Signed)
Controlled, no change in medication  

## 2010-05-06 NOTE — Assessment & Plan Note (Signed)
Deteriorated, pt counseled to qiuit smoking

## 2010-05-06 NOTE — Group Therapy Note (Signed)
  NAMEDEVARIS, QUIRK                ACCOUNT NO.:  [id number]  MEDICAL RECORD NO.:  [id number]           PATIENT TYPE:  I  LOCATION:  A335                          FACILITY:  [facility]  PHYSICIAN:  Necie Wilcoxson L. Juanetta Gosling, M.D.DATE OF BIRTH:  1950-12-27  DATE OF PROCEDURE: DATE OF DISCHARGE:  04/28/2010                                PROGRESS NOTE   Mr. Stapel is a patient of the [facility] and he is admitted with respiratory failure.  This morning, he looks good.  He says he feels well and has no complaints.  PHYSICAL EXAMINATION:  Shows his temperature is 98, pulse 79, respirations 18, blood pressure 149/82, O2 sat 94% on 2 liters.  His chest is clear and he looks very comfortable.  ASSESSMENT:  He has had episodes of respiratory failure, they seem to be related to acute asthma, but it is not totally clear what precipitates his full-blown respiratory failure.  I have discussed his situation with Dr. Karilyn Cota, hospitalist attending.  We both feel that leaving him on 10 mg of prednisone after he finishes his taper, may keep this from happening.  I will plan to sign off at this point.     Kiele Heavrin L. Juanetta Gosling, M.D.     ELH/MEDQ  D:  04/28/2010  T:  04/28/2010  Job:  [id number]  Electronically Signed by Kari Baars M.D. on 05/06/2010 02:38:05 PM

## 2010-05-06 NOTE — Consult Note (Signed)
  Ian, [name]                ACCOUNT NO.:  [id number]  MEDICAL RECORD NO.:  [id number]           PATIENT TYPE:  I  LOCATION:  IC08                          FACILITY:  [facility]  PHYSICIAN:  [name], M.D.DATE OF BIRTH:  Jul 05, 1950  DATE OF CONSULTATION: DATE OF DISCHARGE:                                CONSULTATION   [name] is a patient of the [facility] who I know from previous admissions.  PRIMARY CARE PHYSICIAN:  [name]. Ian Hong, MD  He has admitted with COPD exacerbation and acute respiratory failure. He has known severe COPD and has been intubated on multiple occasions. He came to the emergency room in respiratory distress, was given nebulizer treatments and intubated because he had no improvement.  His past medical history is positive for COPD and asthma.  I saw him in my office about a week ago when he said that he was down to about 2 cigarettes a day.  He has had a previous history of stroke, GI bleedingand possible apical thrombus on echocardiogram.  He has a history of alcoholism and polysubstance abuse.  Social history as mentioned, he has said that he was down to about 2 cigarettes a day and he had not been using any other drugs.  His drug screen showed that he had problems with marijuana.  His family history is essentially unknown.  Review of systems also unknown.  Physical exam shows he is intubated on the ventilator, unresponsive. His pulses in the 90s, O2 sats 99%, respirations 20, blood pressure 126/70.  His chest shows decreased breath sounds, some end-expiratory wheezes.  His heart is regular without murmur, gallop or rub.  His abdomen is soft without masses.  Extremities showed no edema and his central nervous system examination is grossly intact.  LABORATORY WORK:  His glucose is 128.  BMET otherwise essentially normal.  His albumin is 3.6.  CBC shows white count 8100, hemoglobin 11.7, platelets 148.  Blood gas on 40% 500 rate  of 20 shows pH 7.31, pCO2 of 48, pO2 of 178.  Drug screen positive for marijuana, otherwise negative.  ASSESSMENT:  He is a 60 year old with severe chronic obstructive pulmonary disease and asthma who has had multiple episodes of having to be intubated and placed on mechanical ventilation.  He is currently intubated on the ventilator.  I do not think we have much opportunity to do anything today as far as getting him off the ventilator.  We will let him improve with current treatments and then try to get him off probably starting in the morning.     [name], M.D.     ELH/MEDQ  D:  04/24/2010  T:  04/24/2010  Job:  [id number]  cc:   [name], M.D. Fax: [phone]  Electronically Signed by [name] M.D. on 05/06/2010 02:37:47 PM

## 2010-05-06 NOTE — Assessment & Plan Note (Signed)
Uncontrolled. Medication compliance addressed. Commitment to regular exercise, and healthy  eating habits with portion control discussed. DASH diet, and low fat diet discussed, and literature offered. No changes in medication at this time.  

## 2010-05-06 NOTE — Progress Notes (Signed)
  Subjective:    Patient ID: [name], male    DOB: 1950-05-30, 60 y.o.   [name]: [id number]  HPI Pt in for f/u of uncontrolled asthma which requires repeated hospitalizations involving mechanical ventilation. The pt's social circumstances are extremely limited, he often has no medication, no money to obtain them, and he continues to refuse placement. He still smokes and uses pot. He has no plan to quit. He denies uncontrolled depression or anxiety. He has no fever or chills. He has a chronic dry coughHe still wheezes.   Review of Systems Denies recent fever or chills. Denies sinus pressure, nasal congestion, ear pain or sore throat. Denies chest congestion, productive cough Denies chest pains, palpitations, paroxysmal nocturnal dyspnea, orthopnea and leg swelling Denies abdominal pain, nausea, vomiting,diarrhea or constipation.  Denies rectal bleeding or change in bowel movement. Denies dysuria, frequency, hesitancy or incontinence. Denies joint pain, swelling and limitation in mobility. Denies headaches, seizure, numbness, or tingling. Denies depression, anxiety or insomnia. Denies skin break down or rash.        Objective:   Physical Exam Patient alert and oriented and in no Cardiopulmonary distress.Chronically ill appearing and undenourished  HEENT: No facial asymmetry, EOMI, no sinus tenderness, TM's clear, Oropharynx pink and moist.  Neck supple no adenopathy.  Chest: Bilateral wheezes with poor air entry throughout.  CVS: S1, S2 no murmurs, no S3.  ABD: Soft non tender. Bowel sounds normal.  Ext: No edema  MS: Adequate ROM spine, shoulders, hips and knees.  Skin: Intact, no ulcerations or rash noted.  Psych: Good eye contact, normal affect. Memory intact  depressed appearing.  CNS: CN 2-12 intact, power, tone and sensation normal throughout.        Assessment & Plan:

## 2010-05-07 NOTE — Telephone Encounter (Signed)
error 

## 2010-05-21 NOTE — H&P (Signed)
[name], [name]                ACCOUNT NO.:  [id number]  MEDICAL RECORD NO.:  [id number]           PATIENT TYPE:  I  LOCATION:  IC08                          FACILITY:  [facility]  PHYSICIAN:  [name], MD     DATE OF BIRTH:  1950-08-19  DATE OF ADMISSION:  04/24/2010 DATE OF DISCHARGE:  LH                             HISTORY & PHYSICAL   PRIMARY CARE PHYSICIAN:  Milus Mallick. Lodema Hong, MD.  ADMISSION DIAGNOSES: 1. Acute COPD exacerbation with acute respiratory failure. 2. History of tobacco abuse. 3. History of hypertension.  CHIEF COMPLAINT:  Shortness of breath.  HISTORY OF PRESENT ILLNESS:  The patient is a 60 year old African American male, who is well known to our service.  This is his fourth admission so far this year.  He was previously admitted in March.  At that time, he was not intubated.  However, he was admitted back in February, and during that time, he was intubated in the hospital.  The patient presented to the ED with acute shortness of breath.  He was given a couple of nebulizer treatments with no improvement and hence the ED physician proceeded with intubating the patient.  Currently, the patient is sedated, unable to provide any history.  MEDICATIONS AT HOME:  Unknown.  ALLERGIES:  No known drug allergies.  PAST MEDICAL HISTORY:  Positive for COPD/asthma.  He has been intubated many times in the past.  He has a history of stroke in 2008, which caused diplopia.  History of alcoholism and polysubstance of drug abuse as well.  History of GI bleed in the past.  He had an echocardiogram in January, which showed a possible apical thrombus, further management was deferred to the cardiologist, his EF was 70%.  SOCIAL HISTORY:  Unable to do because of his intubated status.  FAMILY HISTORY:  Unable to do because of his intubated status.  REVIEW OF SYSTEMS:  Unable to do because of his intubated status.  PHYSICAL EXAMINATION:  VITAL SIGNS:  Temperature has  not been checked yet.  Heart rate when he came in was in the 150s and 160s, regular.  His blood pressure when he came in was 197, both of these have come down. Heart rate now in the 120s, regular.  Blood pressure actually dropped into the 90s/50s.  He has been given fluid bolus and has improved. Respiratory rate when he came in was 48 breaths per minute.  Saturation 90% on room air.  Currently, he is on the ventilator and his breathing has slowed down. GENERAL:  He is a thin, African American male, in no distress. HEENT:  Head is normocephalic, atraumatic.  Pupils are equal and reacting.  No pallor.  No icterus.  ET tube is in the oral cavity, it going down the trachea. NECK:  Soft and supple. No thyromegaly is appreciated.  No cervical, supraclavicular, or inguinal lymphadenopathy is present. LUNGS:  Reveal end-expiratory wheezing bilaterally.  No crackles are present. CARDIOVASCULAR:  S1 and S2, is tachycardic, regular.  No murmurs appreciated.  No S3-S4. ABDOMEN:  Soft, nontender, nondistended.  Bowel sounds are present.  Nomasses or  organomegaly is appreciated. GU:  External inspection was unremarkable.  Foley catheter is in place, draining urine. EXTREMITIES:  Show no edema. MUSCULOSKELETAL:  Normal muscle mass and tone. NEUROLOGIC:  He is sedated at this time.  LAB DATA:  His ABG showed a pH 7.27, pCO2 is 54, pO2 is 225, bicarb is 24, saturation 99%.  His CBC is unremarkable.  His BMET is remarkable only for glucose of 174.  LFTs are normal.  Cardiac enzymes negative x1. He had chest x-ray, initial ones showed evidence for emphysema, subsequent one showed ET tube placement, 6 cm above the carina.  He had an EKG done, which showed sinus tachycardia at 165, appears to be SVT, regular, normal axis, intervals appear to be in the normal range, no concerning ST or T-wave changes are noted.  ASSESSMENT:  This is a 60 year old African American male with history of chronic  obstructive pulmonary disease/asthma, who presents with shortness of breath, acute respiratory failure, is intubated, and is on mechanical ventilation.  He appears to be sedated.  He dropped his blood pressure after he was started on propofol, but then his blood pressure improved with IV fluids.  We will continue to monitor his pressures. 1. Acute respiratory failure with chronic obstructive pulmonary     disease exacerbation.  We will admit him to the ICU.  Give him     nebulizer treatments, steroids, antibiotics, Advair.  Consult Dr.     Juanetta Gosling for vent management.  Unfortunately, this is an ongoing     problem with this patient, I believe he continues to smoke which is     causing most of his problem right now. 2. Hypotension, probably from propofol.  We will monitor this closely.     If blood pressure remains an issue, we will have to change the     sedative age.  No suspicion at this time for any sepsis or any     hypovolemia or bleeding issues.  He could be a little bit     dehydrated and hence IV fluids are being given. 3. Hypertension, probably from respiratory distress.  We will monitor     him closely. 4. Hyperglycemia.  We will check his CBG q.a.c. and at bedtime.  He is     probably hyperglycemic from steroid use and his blood sugar will     probably tend to rise as long as he is on Solu-Medrol.  An HbA1c     will be obtained. 5. Unresponsiveness: ED physician to some extent was concerned     about the patient not responding appropriately after he has been     sedated.  I think it is more because of his sedation rather than     any other process.  However, a CT head is pending at this time and     we will follow up on the results as well.  DVT prophylaxis will be initiated.  Hopefully, we will obtain his medication list in the morning.  Further management and decisions will depend on results of further testing and patient's response to treatment.   [name],  MD     GK/MEDQ  D:  04/24/2010  T:  04/24/2010  Job:  [id number]  cc:   Ramon Dredge L. Juanetta Gosling, M.D. Fax: [phone]  Milus Mallick. Lodema Hong, M.D. Fax: [phone]  Electronically Signed by [name] MD on 05/21/2010 10:23:50 PM

## 2010-05-22 NOTE — Discharge Summary (Signed)
NAMETAO, SATZ NO.:  [id number]   MEDICAL RECORD NO.:  [id number]          PATIENT TYPE:  INP   LOCATION:  A212                          FACILITY:  [facility]   PHYSICIAN:  [name], MD     DATE OF BIRTH:  05-12-1950   DATE OF ADMISSION:  11/15/2006  DATE OF DISCHARGE:  11/11/2008LH                               DISCHARGE SUMMARY   PRIMARY MEDICAL DOCTOR:  Dr. Syliva Overman.   The patient was seen by Dr. Gerilyn Pilgrim, neurologist.   DISCHARGE DIAGNOSES:  1. Diplopia likely secondary to acute stroke.  2. History of alcoholism.  3. History of hypertension.  4. History of depression.   Please see H&P dictated at the time of admission for details regarding  the patient's presenting illness.   BRIEF HOSPITAL COURSE:  1. Diplopia.  This is a 61 year old African-American male who      presented to the ED with sudden onset double vision.  The patient      is known to have a history of alcoholism.  He was found to have      nystagmus, and there was a thought that this could be secondary to      Wernicke's, though he did not have any encephalopathy really.  The      patient admits to using alcohol, but not on a heavy basis at this      time.  He said he used to be a heavy drinker in the past.  The      concern was about third nerve palsy, and so the patient underwent a      neurological evaluation.  Dr. Gerilyn Pilgrim was concerned about stroke,      so an MRI was ordered, and the MRI did show a small acute infarct      in the posterior medulla.  Dopplers of the carotids were also done      which did not show any significant stenosis.  His lipid profile was      checked and it is pending at this time.  TSH was normal.  B12 level      was normal.  Alcohol level was less than 5.  RPR was nonreactive.      The only thing pending is an echo which, for unclear reasons, was      not done yesterday.  Once the echo is done today we will let him go      home.  His risk of  having an embolic source in the heart is very      low.  2. His urine drug screen was positive for marijuana.  He was counseled      about use of marijuana as well.  Alcohol counseling was provided.      Tobacco counseling was provided.  3. His labs were otherwise quite unremarkable.  The patient also      underwent a CT head which was negative for any acute process.   He monitored on telemetry, and no arrhythmias were noted.  EKG  unfortunately was not done.  We will have  an EKG checked.   On the day of discharge, the patient is still having double vision, but  has improved.  He was encouraged to wear an eye patch to help with his  vision.  His vital signs are all pretty stable.  His examination today  did not show any other focal neurological deficits.  He did have left  lateral movement of his eyes today.  The nystagmus was much less  appreciated today.   Once his echo is done and an EKG is done, I think he can go home.  We  will follow up on the results of the echocardiogram and communicate it  to his PMD.   DISCHARGE MEDICATIONS:  Prozac 10 mg daily.  He is on an  antihypertensive medication and a cholesterol pill, however, he does not  know the names.  I did try to contact his PMD's office, but I was not  able to get through.  He will be started also on aspirin 81 mg daily,  thiamine 100 mg daily, multivitamin 1 tablet daily.   FOLLOWUP:  1. Dr. Gerilyn Pilgrim in 1 week.  2. Dr. Lodema Hong in 3-4 weeks.   The patient was offered rest home placement, which he refused, so he  will be going home.   DIET:  Heart-healthy.   PHYSICAL ACTIVITY:  He may need use a cane as necessary.  He was asked  to cover one of his eyes with an eye patch for better vision.   Total time of discharge 35 minutes.      [name], MD  Electronically Signed     GK/MEDQ  D:  11/18/2006  T:  11/18/2006  Job:  [id number]   cc:   Milus Mallick. Lodema Hong, M.D.  Fax: [phone]   Kofi A. Gerilyn Pilgrim, M.D.   Fax: [phone]

## 2010-05-22 NOTE — Consult Note (Signed)
[name], [name]                ACCOUNT NO.:  [id number]   MEDICAL RECORD NO.:  [id number]          PATIENT TYPE:  INP   LOCATION:  A212                          FACILITY:  [facility]   PHYSICIAN:  Kofi A. Gerilyn Pilgrim, M.D. DATE OF BIRTH:  21-Aug-1950   DATE OF CONSULTATION:  11/17/2006  DATE OF DISCHARGE:  11/18/2006                                 CONSULTATION   HISTORY:  This is a 60 year old man who has a history of polysubstance  drug abuse along with alcoholism.  Apparently he has been noncompliant  with medical care.  The patient developed the acute onset of visual  disturbance described as double vision, nausea and vomiting.  The  patient developed the symptoms on awakening on the day in question.  He  does not report focal weakness or numbness.  He did have significant  nausea and vomiting.  No headaches or syncope is reported.  No fevers  are reported.   PAST MEDICAL HISTORY:  1. GI bleed.  2. Asthma.  3. Alcoholism.   SOCIAL HISTORY:  Chronic tobacco user.  He does have a history of heavy  alcohol use.   ALLERGIES:  NONE KNOWN.   HOME MEDICATIONS:  Prozac 10 mg daily.   REVIEW OF SYSTEMS:  Unrevealing other than stated in history of present  illness.   PHYSICAL EXAMINATION:  GENERAL:  Physical examination shows a thin,  pleasant man in no acute distress.  VITAL SIGNS:  Temperature 98.2, pulse 77, respirations 20, blood  pressure 133/75.  HEENT:  Head is normocephalic, atraumatic.  NECK:  Supple.  ABDOMEN:  Soft.  EXTREMITIES:  No cyanosis or edema.  MENTATION:  The patient is awake, alert.  He converses well.  Speech is  normal.  Language and cognition are also unrevealing.  CRANIAL NERVES:  The patient has a left gaze palsy with a right beating  nystagmus on attempted left gaze.  Both pupils barely pass the midline.  Extraocular muscles are intact.  Visual fields are full.  Pupils are  equal, round, and reactive to light.  Facial muscle strength is  symmetric.   Tongue is midline.  MUSCLE:  No pronator drift.  Tone, bulk and strength were also normal.  COORDINATION:  There is no dysmetria, tremors or pass pointing.  Reflexes are preserved.  Sensation is normal to light touch and  temperature.   ASSESSMENT:  1. The patient's examination seems consistent with classic gaze palsy      due to pontine lesions presumably due to an infarct in this case.  2. Alcoholism.   RECOMMENDATIONS:  1. MRI of the brain.  2. Carotid Doppler and echocardiography.  3. Add blood testing for RPR, thyroid function tests, homocystine and      B12 level.  4. Aspirin.   Thanks for this consultation.      Kofi A. Gerilyn Pilgrim, M.D.  Electronically Signed     KAD/MEDQ  D:  11/20/2006  T:  11/21/2006  Job:  [id number]

## 2010-05-22 NOTE — Procedures (Signed)
NAMEDEANTRE, BOURDON NO.:  [id number]   MEDICAL RECORD NO.:  [id number]          PATIENT TYPE:  INP   LOCATION:  A212                          FACILITY:  [facility]   PHYSICIAN:  Gerrit Friends. Dietrich Pates, MD, FACCDATE OF BIRTH:  03-24-1950   DATE OF PROCEDURE:  11/18/2006  DATE OF DISCHARGE:  11/18/2006                                ECHOCARDIOGRAM   CLINICAL DATA:  A 60 year old gentleman with hypertension and neurologic  symptoms.  Septum 1.0, posterior wall 1.0, LV diastole 4.6, LV systole  3.6.  1. Technically suboptimal but adequate echocardiographic study.  2. Normal left atrium, right atrium and right ventricle.  3. Normal diameter of the proximal ascending aorta; mild calcification      of the wall and annulus.  4. Very mild sclerosis of the aortic valve.  5. Normal mitral and tricuspid valves.  6. Normal proximal pulmonary artery; pulmonic valve not adequately      imaged.  7. Normal IVC.  8. Normal descending aorta.  9. Normal internal dimension, wall thickness, regional and global      function of the left ventricle.      Gerrit Friends. Dietrich Pates, MD, [facility]  Electronically Signed     RMR/MEDQ  D:  11/18/2006  T:  11/19/2006  Job:  [id number]

## 2010-05-22 NOTE — H&P (Signed)
NAME:  [name], [name]                ACCOUNT NO.:  [id number]   MEDICAL RECORD NO.:  [id number]          PATIENT TYPE:  AMB   LOCATION:                                FACILITY:  [facility]   PHYSICIAN:  Dalia Heading, M.D.  DATE OF BIRTH:  Oct 12, 1950   DATE OF ADMISSION:  DATE OF DISCHARGE:  LH                              HISTORY & PHYSICAL   CHIEF COMPLAINT:  Need for screening colonoscopy.   HISTORY OF PRESENT ILLNESS:  The patient is a 60 year old black male who  is referred for endoscopic evaluation.  He needs a colonoscopy for  screening purposes.  No abdominal pain, weight loss, nausea, vomiting,  diarrhea, constipation, melena or hematochezia have been noted.  He has  never had a colonoscopy.  He has no family history of colon carcinoma.   PAST MEDICAL HISTORY:  Unremarkable.   PAST SURGICAL HISTORY:  Unremarkable.   CURRENT MEDICATIONS:  Unknown.   ALLERGIES:  No known drug allergies.   REVIEW OF SYSTEMS:  Noncontributory.   PHYSICAL EXAMINATION:  GENERAL:  The patient is a well-developed, well-  nourished black male in no acute distress.  LUNGS:  Clear to auscultation with equal breath sounds bilaterally.  HEART:  Reveals a regular rate and rhythm without S3, S4 or murmurs.  ABDOMEN:  Soft, nontender, nondistended.  No hepatosplenomegaly or  masses are noted.  RECTAL:  Deferred to the procedure.   IMPRESSION:  Need for screening colonoscopy.   PLAN:  The patient is scheduled for a colonoscopy on July 15, 2006.  The  risks and benefits of the procedure including bleeding and perforation  were fully explained to the patient, who gave informed consent.      Dalia Heading, M.D.  Electronically Signed     MAJ/MEDQ  D:  06/26/2006  T:  06/26/2006  Job:  [id number]   cc:   Milus Mallick. Lodema Hong, M.D.  Fax: [phone]

## 2010-05-22 NOTE — H&P (Signed)
[name], BOOKWALTER                ACCOUNT NO.:  [id number]   MEDICAL RECORD NO.:  [id number]          PATIENT TYPE:  INP   LOCATION:  A212                          FACILITY:  [facility]   PHYSICIAN:  Marcello Moores, MD   DATE OF BIRTH:  1950/03/01   DATE OF ADMISSION:  11/15/2006  DATE OF DISCHARGE:  LH                              HISTORY & PHYSICAL   PRIMARY MEDICAL DOCTOR:  Dr. Syliva Overman.   CHIEF COMPLAINT:  Nausea, vomiting and double vision.   HISTORY OF PRESENT ILLNESS:  [name] is a 60 year old man with  history of polysubstance abuse, tobacco, alcohol and a history of  asthma, who is noncompliant with followup with his PMD and came with  above complaint.  The patient stated that he has started to have  generalized weakness with nausea and vomiting since yesterday and he  started to have also visual disturbance with double fusion, especially  when he is trying to look with both his eyes.  Otherwise, he denied any  pain.  No headache, no syncope or dizziness.  He has no fever, no chest  or abdominal complaints.  No urinary complaints.  The patient stated  that he drinks alcohol very frequently and the last he drank is  yesterday as per the patient, but denied any drug use currently.   REVIEW OF SYSTEMS:  Ten-point review of system is noncontributory,  except as dictated in the HPI.   ALLERGIES:  NO KNOWN DRUG ALLERGIES.   SOCIAL HISTORY:  He is a chronic smoker, alcohol abuser.   FAMILY HISTORY:  Noncontributory.   PAST MEDICAL HISTORY:  1. Polysubstance abuse, tobacco as well as alcohol.  2. History of asthma before.  3. History of GI bleed.   HOME MEDICATIONS:  Currently, the patient is not on any medications, as  he is noncompliant, only Fluoxetine 10 mg p.o. daily was recorded.   PHYSICAL EXAMINATION:  GENERAL:  The patient is lying in the bed without  any distress, complaining only of visual disturbance with double vision.  VITAL SIGNS:  Temperature is  97, blood pressure 130/60, pulse is 71,  respiratory rate 16 and saturation is 96% on room air.  HEENT:  Pink  conjunctivae.  Anicteric sclerae.  Pupils are equal and reactive  bilaterally.  There is no fascial deviation.  Currently, there is no  vertical or horizontal nystagmus, even though it was recorded by the  emergency physician previously, but he was given some thiamine and folic  acid.  NECK:  Supple.  CHEST: Good air entry.  CV:  S1 and S2, regular.  ABDOMEN:  Soft.  No area of tenderness.  EXTREMITIES:  No pedal edema.  CNS:  He is alert and well oriented.  There are not any focal deficits.   LABORATORY AND ACCESSORY CLINICAL DATA:  White blood cell count 5.4,  hemoglobin 13, hematocrit 40, platelet count is 158,000.  On the  chemistries, sodium is 136, potassium is 3.7, chloride 102, bicarb 29,  BUN 7, creatinine 0.8.  Urine drug screen is only positive for  tetrahydrocannabinol, alcohol is less  than 5.  Urinalysis is negative.   CAT scan of the brain is pending.   ASSESSMENT:  Double vision associated with nausea and vomiting, without  any other neurological deficit and horizontal nystagmus was also  detected by emergency department physician and in a patient who is  alcoholic, it might suggest warnings of encephalopathy and we will admit  the patient to telemetry and will monitor him and will give him  intravenous thiamine, folic acid and vitamins and we will reexamine his  level of consciousness and for any neurological deficit and will follow  CAT scan of the brain and we might need to do also MRI of the brain and  we will consult neurologist, Dr. Gerilyn Pilgrim, and we will send RPR and we  will put him on Ativan protocol for possible alcohol withdrawal.  We  will continue our management depending on his clinical response and the  patient will be put on deep venous thrombosis as well as  gastrointestinal prophylaxis.  Ophthalmic as well as ears, nose and  throat and  internal ear canal problems also are differentials, even  though the patient denied any orbital pain and vertigo.      Marcello Moores, MD  Electronically Signed     MT/MEDQ  D:  11/15/2006  T:  11/17/2006  Job:  [id number]

## 2010-05-24 ENCOUNTER — Encounter: Payer: Self-pay | Admitting: Family Medicine

## 2010-05-25 NOTE — Group Therapy Note (Signed)
[name], [name]                ACCOUNT NO.:  [id number]   MEDICAL RECORD NO.:  [id number]          PATIENT TYPE:  INP   LOCATION:  IC09                          FACILITY:  [facility]   PHYSICIAN:  Margaretmary Dys, M.D.DATE OF BIRTH:  Dec 14, 1950   DATE OF PROCEDURE:  12/25/2003  DATE OF DISCHARGE:                                   PROGRESS NOTE   Progress note for hospital day #3.   SUBJECTIVE:  The patient feels much better, was extubated yesterday  successfully. Has remained on only 1 liter of oxygen overnight with  saturations in the range of upper 90s. The patient says he still feels a  little bit of tightness in his chest but overall feels much better. He has  no headache, dizziness, or lightheadedness. He denies any chest pain. He  continues to do very well. His nebulizers were changed to q.4h.   OBJECTIVE:  GENERAL:  Conscious, alert, comfortable, in no acute distress.  The patient was receiving nebulizer treatments when I saw him.  VITAL SIGNS:  Blood pressure 115/63, pulse of 90, respiratory rate 17,  temperature 98.6. Oxygen saturations are 95% on 1 liter. He was saturating  100% on the nebulizer.  HEENT:  Normocephalic, atraumatic. Oral mucosa was moist.  NECK:  Supple. No JVD.  LUNGS:  The patient had rhonchi bilaterally but seemed to be much better. No  crackles.  HEART:  S1 and S2 regular. No S3, S4, gallops, or rubs.  ABDOMEN:  Soft, nontender, bowel sounds positive. No masses palpable.  EXTREMITIES:  No pitting pedal edema.  CENTRAL NERVOUS SYSTEM:  Grossly intact with no focal deficits.   LABORATORY DATA:  White blood cell count of 14,500, hemoglobin of 10.8,  hematocrit 32.2, platelet count 162, neutrophils 96%. I think this elevation  in his white blood cells is likely related to the steroid use. The patient's  chest x-ray from yesterday was negative with no evidence of active disease.  A pH is 7.389, pCO2 45.3, pO2 of 65.5, bicarbonate 26.8. Oxygen  saturations  were 92%.  His sodium 135, potassium 4.1, chloride 103, CO2 27, glucose 136, BUN 12,  creatinine 0.9, calcium 8.4.   ASSESSMENT/PLAN:  [name] is a 60 year old African-American male admitted  with acute respiratory failure secondary to acute exacerbation of asthma. He  was intubated and put on mechanical ventilation. He was successfully  extubated yesterday after a very short wean. The patient has continued to do  very well. Oxygen saturations remained in the upper 90s on 1 liter. I will  titrate him off oxygen this morning. He will be transferred out of intensive  care unit. His likely elevated cardiac enzymes have returned to normal. They  were likely secondary to some myocardial necrosis.   DISPOSITION:  Transfer out to 2A today when off oxygen. Switch Solu-Medrol  to oral prednisone. Anticipate discharge in 1 to 2 days.     [name]   AM/MEDQ  D:  12/25/2003  T:  12/25/2003  Job:  [id number]

## 2010-05-25 NOTE — Op Note (Signed)
[name], [name]                ACCOUNT NO.:  [id number]   MEDICAL RECORD NO.:  [id number]          PATIENT TYPE:  AMB   LOCATION:  DAY                           FACILITY:  [facility]   PHYSICIAN:  Lionel December, M.D.    DATE OF BIRTH:  12/16/1950   DATE OF PROCEDURE:  11/22/2003  DATE OF DISCHARGE:                                 OPERATIVE REPORT   PROCEDURE:  Esophagogastroduodenoscopy.   [name] is a 60 year old African-American male with history of recurrent  peptic ulcer disease.  He presented with upper GI bleed three months ago and  found to have three large gastric ulcers.  He presented with upper GI bleed  in September 2004.  His ulcers were noted to have healed completely on  February 2005 study.  He was treated for H. pylori gastritis and eradication  documented by a negative breath test.  He has been maintained on a PPI.  He  is doing well.  He is undergoing EGD to document complete healing of these  ulcers.  Please note that his fasting gastrin level was normal.   The procedure risks were reviewed with the patient and informed consent was  obtained.   PREMEDICATION:  Cetacaine spray for pharyngeal topical anesthesia, Demerol  50 mg IV, Versed 5 mg IV in divided dose.   FINDINGS:  Procedure performed in endoscopy suite.  Patient's vital signs  and O2 saturation were monitored during procedure and remained stable.  The  patient was placed in the left lateral recumbent position and the Olympus  video scope was passed via oropharynx without any difficulty into esophagus.   Esophagus:  Mucosa of the esophagus normal.  Gastroesophageal junction was  located at 42 cm from the incisors.   Stomach:  It was empty and distended very well with insufflation.  Folds of  proximal stomach were normal.  Examination of the mucosa revealed a scar at  angularis and another scar at prepyloric area with focal erythema.  No  erosions or ulcers were noted.  The pyloric channel was patent.  The  fundus  and cardia were also examined by retroflexing the scope and were normal.   Duodenum:  Examination of the bulb and postbulbar duodenum was normal.  The  endoscope was withdrawn.   FINAL DIAGNOSES:  1.  All (three) gastric ulcers have healed completely.  2.  Normal examination of the esophagus and first and second part of the      duodenum.   RECOMMENDATIONS:  1.  The patient advised not to take NSAIDs.  2.  He can stop his Protonix.  3.  He will return to our office on an as-needed basis.     Naje   NR/MEDQ  D:  11/22/2003  T:  11/22/2003  Job:  [id number]

## 2010-05-25 NOTE — Op Note (Signed)
NAME:  [name], [name]                          ACCOUNT NO.:  [id number]   MEDICAL RECORD NO.:  [id number]                   PATIENT TYPE:  INP   LOCATION:  A225                                 FACILITY:  [facility]   PHYSICIAN:  Lionel December, M.D.                 DATE OF BIRTH:  1950/06/10   DATE OF PROCEDURE:  09/10/2002  DATE OF DISCHARGE:                                 OPERATIVE REPORT   PROCEDURE:  Esophagogastroduodenoscopy.   INDICATIONS FOR PROCEDURE:  [name] is a 60 year old African-American male  with nausea, vomiting, abdominal pain and melena as well as anemia. He is  receiving PRBCs.  He has a remote history of peptic ulcer disease. The  procedure is reviewed with the patient and informed consent was obtained.   PREOP MEDICATIONS:  Cetacaine spray for oropharyngeal topical anesthesia,  Demerol 50 mg IV, Versed 5 mg IV.   FINDINGS:  Procedure performed in endoscopy suite. The patient's vital signs  and O2 sat were monitored during the procedure and remained stable. The  patient was placed in the left lateral decubitus position and endoscope was  passed through oropharynx without any difficulty into the esophagus.   ESOPHAGUS:  The mucosa was normal throughout. The squamocolumnar junction  was also normal.   STOMACH:  It was empty and distended very well with insufflation. Folds of  the proximal stomach were normal. Examination of the mucosa revealed antral  erosions, small linear antral ulcer along with a large at least 2 cm  diameter very deep ulcer at the angularis. He had some black eschar but  there was no protruding blood vessel or active bleeding. A biopsy was taken  from the ulcer margin. The fundus and cardia was normal. There was  prepyloric scarring but the scope was easily passed across the pylorus and  the bulb.   DUODENUM:  Examination of the bulb revealed normal mucosa. The scope was  passed in the second part of the duodenum. Mucosa and folds were normal.  The  endoscope was withdrawn. The patient tolerated the procedure well.   FINAL DIAGNOSES:  Large deep gastric ulcer at angularis felt to be source of  blood loss. No active bleeding noted. Smaller antral ulcer. Antral gastritis  with prepyloric scarring.   RECOMMENDATIONS:  1. Full liquid diet.  2. H. pylori serology.  3.     Will switch him to Protonix 40 mg p.o. b.i.d. starting in a.m.  4. Will followup on biopsies and he will need to return for followup EGD in     10-12 weeks to document complete healing of this ulcer.                                               Lionel December, M.D.  NR/MEDQ  D:  09/10/2002  T:  09/11/2002  Job:  [id number]   cc:   Hanley Hays. Dechurch, M.D.  [address]  [place]  [place] [address]  Fax: [phone]

## 2010-05-25 NOTE — Group Therapy Note (Signed)
[name], [name]                ACCOUNT NO.:  [id number]   MEDICAL RECORD NO.:  [id number]          PATIENT TYPE:  INP   LOCATION:  IC09                          FACILITY:  [facility]   PHYSICIAN:  [name], M.D.DATE OF BIRTH:  July 03, 1950   DATE OF PROCEDURE:  12/24/2003  DATE OF DISCHARGE:                                   PROGRESS NOTE   Progress for hospital day #2.   SUBJECTIVE:  The patient feels much better, remains intubated.  He is awake,  alert, follows commands.  The patient is doing very well.  Has no chest pain  and does not feel he is short of breath.  Does not have any chest tightness.  Denies any headache, dizziness or lightheadedness.  The patient has had an  uneventful intensive care unit course.   OBJECTIVE:  Conscious, alert, comfortable, not in acute distress.  The  patient remains intubated.  Blood pressure is 101/60.  His pulse is 88,  temperature 97.3.  FiO2 40%.  Oxygen saturation 99%.  He remains on assist  control with tidal volumes of 500, PEEP of 5, rate of 16.   HEENT:  Normocephalic, atraumatic.  Oral mucosa was moist.  Neck supple, no  JVD.  Lungs clear clinically with good air entry bilaterally.  The patient  had no crackles.  No rhonchi was heard.  Heart:  S1 & S2 regular.  No S3, S4  gallops or rubs.  Abdomen was soft, nontender, bowel sounds were positive.  No masses palpable.  Extremities with no pitting edema, calf induration or  tenderness was noted.  CNS exam grossly intact with no focal deficits.   Laboratory data:  Blood gas from this morning:  FiO2 of 40%, assist control,  a rate of 16, PEEP of 5, pH of 7.3, PCO2 48.1, PO2 of 173, bicarb was 23.7,  , oxygen saturation was 98.9%.   Sodium 132, potassium 4.5, chloride 104, CO2 23, glucose 160, BUN 9,  creatinine 0.9, calcium 8.3.  Cardiac enzymes:  Two values were noted to be  slightly elevated.  We will repeat again today.   ASSESSMENT AND PLAN:  [name] is a 60 year old  African-American male  admitted with acute respiratory failure secondary to acute exacerbation of  asthma.  He is currently doing very well on mechanical ventilation and we  will attempt to wean him and do a subsequently extubation hopefully in the  next hour.  I will put him on pressure support of 10/5 and we will review  his blood gases in 1/2 an hour.  I did inform him about this.   His enzymes were slightly elevated on admission.  Could be related to his  acute respiratory failure with some mild myocardial necrosis.  We will  obtain a third troponin to see the trend.   DISPOSITION:  Extubation today.  Will keep in ICU for now.    [name]  AM/MEDQ  D:  12/24/2003  T:  12/24/2003  Job:  [id number]

## 2010-05-25 NOTE — Consult Note (Signed)
NAME:  [name], [name]                          ACCOUNT NO.:  [id number]   MEDICAL RECORD NO.:  [id number]                   PATIENT TYPE:  INP   LOCATION:  A225                                 FACILITY:  [facility]   PHYSICIAN:  Lionel December, M.D.                 DATE OF BIRTH:  [birth year]   DATE OF CONSULTATION:  09/09/2002  DATE OF DISCHARGE:                                   CONSULTATION   REASON FOR CONSULTATION:  GI bleed and anemia.   HISTORY OF PRESENT ILLNESS:  [name] is a 60 year old African-American male  who is admitted to Dr. Althea Grimmer service early today via the emergency room  where he presented with a 1 week history of intermittent nausea, vomiting,  and epigastric pain and melena.  In the emergency room he was noted to have  tarry heme positive stool.  The patient gives a history of peptic ulcer  disease.  He was apparently hospitalized 10 years ago, but does not remember  the details. He does remember that he did not have GI bleeding or need for  transfusion.  He describes his pain to be a nagging, sharp pain in the  midepigastrium which is intermittent.  It seemed to get somewhat better when  he could keep his food down. He has not had any hematemesis, heartburn, or  dysphagia.  A few months ago he was 130 pounds and now he is 113.  He is not  sure if most of this weight loss was in the last few weeks. He denies using  aspirin or other NSAIDS.  He states that he does not take any medications at  all.  He denies bright red blood per rectum, fever, chills, or night sweats.   MEDICATIONS:  1. He is on Protonix 40 mg IV q.24h.  2. Phenergan 12.5-25 mg q.4h. p.r.n.  3. Lorazepam p.r.n.   PAST MEDICAL HISTORY:  History of peptic ulcer disease in the past.  DTLs  unknown.   PAST SURGICAL HISTORY:  He has never had any surgeries.   ALLERGIES:  None known.   FAMILY HISTORY:  Father lived to be in his 41s; and mother died of a young  age when he was 41 years old.  He has  2 brothers and 1 sister who are in good  health.   SOCIAL HISTORY:  He is widowed. His wife died of a few years ago of  carcinoma.  He has one son, but the patient does not have any contact with  him.  He has been smoking 1-1/2 packs per day since he was a teenager. He  states that he used to drink alcohol excessively, but over the years he has  cut back and now he drinks only about a pint on weekends.  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  He has been on this job  for  4 months.  Prior to that he has held multiple jobs.  He lives alone.  As  was pointed out to me by Dr. Josefine Class, the patient dropped out of the  eleventh grade, but he is not able to read or write.   PHYSICAL EXAMINATION:  GENERAL: A well-developed, thin, African-American  male who is in no acute distress.  VITAL SIGNS:  He weighs 113.3 pounds. He is 5 feet 7 inches tall.  Pulse  88/minute, blood pressure 144/79, respiratory rate is 20, and temperature is  99.7.  HEENT: Conjunctivae is pale.  Sclerae is nonicteric.  Oropharyngeal mucosa  is normal.  He is edentulous.  He does have dentures but they are on his  nightstand.  NECK:  Without masses or thyromegaly.  CARDIOVASCULAR:  Cardiac exam with regular rhythm normal S1 and S2.  No  murmur or gallop noted.  LUNGS:  Clear to auscultation.  ABDOMEN:  Scaphoid.  Bowel sounds are normal.  Palpation reveals soft  abdomen with moderate tenderness in the midepigastrium with some voluntary  guarding. No organomegaly or masses noted.  RECTAL: Not done since he had one in the emergency room.  NEUROLOGIC: He is awake and alert.  He does not have tremors.  EXTREMITIES: He does not clubbing or peripheral edema.   LABS ON ADMISSION:  WBC 6.3, H&H was 9.3 and 28.1, MCV is 91.7, platelet  count 268,000.  PT is 13.6; INR is 1.1; and PTT is 28. Sodium 133, potassium  4.2, chloride 99, CO2 31, glucose 113, BUN 7, creatinine 1.1, bilirubin 0.4.  AP 57, AST  15, ALT 13, total protein 6 with albumin of 3.2 and calcium is  9.5.  Serum amylase is 204 and lipase is 92.   ASSESSMENT:  [name] is a 60 year old African-American male who presents with  a 1-week history of epigastric pain, nausea and vomiting as well as melena.  He is anemic.  His stool is tarry and guaiac positive.  His amylase is  mildly elevated at 204, but lipase is normal at 42.   I suspect that we are dealing with peptic ulcer disease with GI bleed and  anemia. A mildly elevated serum amylase is rather nonspecific and can be  explained on the basis of peptic ulcer disease.  If his EGD is entirely  normal he would need a colonoscopy and possibly an ultrasound.   RECOMMENDATIONS:  1. I agree with treating him with IV Pepcid.  2. Diagnostic esophagogastroduodenoscopy to be performed on September 10, 2002.  I have reviewed the procedure and risks with the patient and he is     agreeable.   I  would like to thank Dr. Josefine Class for the opportunity to participate in  the care of this gentleman.                                               Lionel December, M.D.    NR/MEDQ  D:  09/09/2002  T:  09/10/2002  Job:  [id number]   cc:   Hanley Hays. Dechurch, M.D.  [address]  [place]  [place] [address]  Fax: [phone]

## 2010-05-25 NOTE — Op Note (Signed)
NAME:  [name], [name]                          ACCOUNT NO.:  [id number]   MEDICAL RECORD NO.:  [id number]                   PATIENT TYPE:  AMB   LOCATION:  DAY                                  FACILITY:  [facility]   PHYSICIAN:  Lionel December, M.D.                 DATE OF BIRTH:  Apr 07, 1950   DATE OF PROCEDURE:  DATE OF DISCHARGE:                                 OPERATIVE REPORT   PROCEDURE:  Esophagogastroduodenoscopy followed by total colonoscopy.   ENDOSCOPIST:  Lionel December, M.D.   INDICATIONS:  [name] is a 60 year old African-American male who was  hospitalized in September with a GI bleed.  He received 3 units of PRBCs.  He was found to have 2 gastric ulcers.  One was deep and large.  He has been  treated for H. pylori gastritis with 2 weeks of Prevpac.  He was on a PPI  for a couple of months.  He is now returning to make sure that these gastric  ulcers have healed completely.  He will also undergo screening colonoscopy.  The procedure and risks were reviewed with the patient and informed consent  was obtained.   PREOPERATIVE MEDICATIONS:  Cetacaine spray for oropharyngeal topical  anesthesia, Demerol 50 mg IV and Versed 8 mg IV in divided dose.   FINDINGS:  Procedure performed in endoscopy suite.  The patient's vital  signs and O2 saturation were monitored during the procedure and remained  stable.   PROCEDURE #1: ESOPHAGOGASTRODUODENOSCOPY:  The patient was placed in the  left lateral recumbent position and Olympus videoscope was passed via the  oropharynx without any difficulty into the esophagus.   ESOPHAGUS:  Mucosa of the esophagus was normal throughout.  Squamocolumnar  junction was unremarkable.   STOMACH:  It was empty and distended very well with insufflation.  The folds  of the proximal stomach revealed erythema and edema.  There was some patchy  erythema at antrum also.  There was a scar at gastric body.  Both of these  ulcers completely healed.  The pyloric  channel was patent.  Angularis,  fundus, and cardia were examined by retroflexing the scope and were normal.   DUODENUM:  Examination of the bulb and postbulbar duodenum was normal.   Endoscope was withdrawn the patient was prepared for procedure #2.   COLONOSCOPY:  Rectal examination was performed.  No abnormality noted on  external or digital exam.   Olympus videoscope was placed in the rectum and advanced under vision into  the sigmoid colon and beyond.  Preparation was satisfactory.  The scope was  advanced into the cecum which was identified by appendiceal orifice and the  ileocecal valve.  There was a raised red nodule on the ileocecal valve which  was ablated by a cold biopsy.  As the scope was withdrawn the colonic mucosa  was carefully examined.  There were 2 tiny diverticula at the transverse  colon.  The mucosa and the rest of the colon was normal.  Rectal mucosa  similarly was normal.   The scope was retroflexed to examine the anorectal junction and hemorrhoids  were noted below the dentate line. The endoscope was straightened and  withdrawn.  The patient tolerated the procedure well.   FINAL DIAGNOSES:  1. Gastric ulcers have completely healed.  The patient still has gastritis.  2. Two tiny diverticula at transverse colon, external hemorrhoids, raised.  3. Nodule with red mucosa at ileocecal valve which was ablated by cold     biopsy.   RECOMMENDATIONS:  1. We will bring him back for H. pylori serology.  2. High fiber diet.      ___________________________________________                                            Lionel December, M.D.   NR/MEDQ  D:  03/02/2003  T:  03/02/2003  Job:  [id number]

## 2010-05-25 NOTE — Procedures (Signed)
NAME:  [name], [name]                          ACCOUNT NO.:  [id number]   MEDICAL RECORD NO.:  [id number]                   PATIENT TYPE:  OUT   LOCATION:  RAD                                  FACILITY:  [facility]   PHYSICIAN:  Edward L. Juanetta Gosling, M.D.             DATE OF BIRTH:  1950-05-08   DATE OF PROCEDURE:  08/13/2003  DATE OF DISCHARGE:  08/11/2003                              PULMONARY FUNCTION TEST   RESULTS:  1. Stereometry shows a severe ventilatory defect with evidence of air flow     obstruction.  2. Lung volumes show marked air trapping, but normal total lung capacity.  3. DLCO is mildly reduced.  4. Arterial blood gases show slight increase in PCO2 with normal pH,     suggestive of a chronic increased PCO2.  5. Response to inhaled bronchodilator that approaches the level of a     significant response.  Since the DLCO is not terribly reduced, this     pulmonary function test is possibly more consistent with asthma than     chronic obstructive pulmonary disease.      ___________________________________________                                            Oneal Deputy. Juanetta Gosling, M.D.   Gwenlyn Found  D:  08/13/2003  T:  08/13/2003  Job:  [id number]

## 2010-05-25 NOTE — Discharge Summary (Signed)
NAME:  [name], [name]                          ACCOUNT NO.:  [id number]   MEDICAL RECORD NO.:  [id number]                   PATIENT TYPE:  INP   LOCATION:  A225                                 FACILITY:  [facility]   PHYSICIAN:  Hanley Hays. Dechurch, M.D.           DATE OF BIRTH:  [birth year]   DATE OF ADMISSION:  09/09/2002  DATE OF DISCHARGE:  09/13/2002                                 DISCHARGE SUMMARY   DISCHARGE DIAGNOSES:  1. Gastrointestinal bleed, requiring transfusion.  2. Gastric ulcers 2 cm, biopsy pending, small antral ulcer and scarring was     noted, normal duodenum.  3. Tobacco abuse.  4. Remote history of alcohol abuse.  5. THC abuse.  6. Left carotid bruit.   DISPOSITION:  The patient is discharged to home. Follow up with Dr. Karilyn Cota,  they will call to arrange.  Protonix 40 mg b.i.d., samples arranged.  Follow up with the free clinic.  Information given the patient, verbally explained due to his illiteracy.   HOSPITAL COURSE:  A 60 year old African American gentleman who was seen at  the health department and referred to the emergency room because of vomiting  for one week.  He also had diarrhea.  He had heme-positive stools on exam.  Hemoglobin was initially at 9.6.  With hydration over the next 24 hours, it  fell to a low of 7.  He received a total of three units of packed red cells.  The patient stabilized.  He had no further bleeding.  He underwent  endoscopy.  Findings included a 2-cm deep ulcer at the __________.  Biopsy  was taken and there was a small antral ulcer and notable scarring.  The  patient denied emphatically no other medications, including any over-the-  counter medicines or even Tylenol, particularly nonsteroidals.  In any  event, the patient remained clinically stable.  His diet was advanced.  He  tolerated it well.   On the day of discharge, he is alert and pleasant, somewhat anxious, but  this is his baseline.  Blood pressure is 115/66, pulse  is 64 and regular,  respirations are unlabored.  Lungs are clear to auscultation anterior and  posterior.  The heart is regular rate and rhythm, no murmur, gallop, or rub.  The abdomen is flat, soft, and nontender.  No  clubbing, cyanosis, or edema are noted to the extremities.  Neurologic, he  is alert and appropriate, nonfocal neurologic exam.   ASSESSMENT AND PLAN:  As noted above.                                               Hanley Hays Josefine Class, M.D.    FED/MEDQ  D:  09/13/2002  T:  09/13/2002  Job:  [id number]   cc:   [facility]  c/o Dierdre Forth   Lionel December, M.D.  [address]  [place]  [place] [address]  Fax: [phone]

## 2010-05-25 NOTE — Procedures (Signed)
[name], [name]                ACCOUNT NO.:  [id number]   MEDICAL RECORD NO.:  [id number]          PATIENT TYPE:  OUT   LOCATION:  RESP                          FACILITY:  [facility]   PHYSICIAN:  Edward L. Juanetta Gosling, M.D.DATE OF BIRTH:  08-23-1950   DATE OF PROCEDURE:  DATE OF DISCHARGE:  02/17/2004                              PULMONARY FUNCTION TEST   1.  Spirometry shows severe ventilatory defect with evidence of airflow      obstruction.  2. Lung volumes show marked air trapping.  3. DLCO is      mildly reduced.  4. There is significant improvement with inhaled      bronchodilator.  This is compared with study of August 11, 2003 and there      has been significant worsening of his pulmonary function in that      interval.      ELH/MEDQ  D:  02/18/2004  T:  02/19/2004  Job:  [id number]   cc:   Milus Mallick. Lodema Hong, M.D.  [address]  [place], [place] [address]  Fax: [phone]

## 2010-05-25 NOTE — Discharge Summary (Signed)
NAMETRACER, GUTRIDGE NO.:  [id number]   MEDICAL RECORD NO.:  [id number]          PATIENT TYPE:  INP   LOCATION:  A206                          FACILITY:  [facility]   PHYSICIAN:  Vania Rea, M.D. DATE OF BIRTH:  [birth year]   DATE OF ADMISSION:  12/23/2003  DATE OF DISCHARGE:  12/19/2005LH                                 DISCHARGE SUMMARY   Primary care physician, Dr. Syliva Overman.   NOT VALID UNLESS ELCETRONICALLY SIGNED.   DISCHARGE DIAGNOSIS:  1.  Acute exacerbation of asthma, resolved.  2.  Respiratory failure requiring mechanical ventilation due to acute      exacerbation of asthma.  3.  Moraxella catarrhalis lower respiratory infection  4.  Chronic tobacco abuse.  5.  History of peptic ulcer disease with gastrointestinal bleed.   DISPOSITION:  Discharged to home.   DISCHARGE CONDITION:  Stable.   DISCHARGE MEDICATIONS:  1.  Prednisone 60 mg daily, to be tapered by 10 mg every three days over 18      days.  2.  Protonix 40 mg twice daily.  3.  Zithromax 500 mg daily for 5 days.  4.  Advair 250/50 one puff twice daily.  5.  Albuterol nebs every 4 hours while awake.  6.  Atrovent nebs every 4 hours while awake.   HOSPITAL COURSE:  Please refer to admission history and physical.  This is a  60 year old African-American gentleman with a history of tobacco abuse who  had been evaluated in the past for obstructive airway disease and who by  pulmonary functions has found to have reversible airway flow disease who, a  few days prior to admission, began having acute exacerbation of his asthma  which was not relieved with home nebulizations.  Came to the emergency room  and got progressively worse while he was in the emergency room, such that he  required emergent intubation.  Patient was paralyzed and sedated and was on  the ventilator for 24 hours before successful extubation.   Patient was treated with high-dose steroids, empirical antibiotics and  round-  the-clock nebulizations.   Sputum cultures while on the vent grew abundant Moraxella catarrhalis.   This morning, the patient is alert and oriented.  He is breathing  comfortably on room air.  He says that he feels at 100% of his baseline and  he is anxious to go home.   OBJECTIVE:  VITALS:  His temperature is 98, pulse 87, respirations 18, blood  pressure 122/75.  He is saturating at 98% on two liters, 96% on room air.  HEENT:  Pupils are round, equal and reactive.  CHEST:  He has mild rhonchi at the bases but good air entry.  CARDIOVASCULAR:  Regular rhythm.  ABDOMEN:  Scaphoid, soft and nontender.  EXTREMITIES:  Without edema.   LABORATORY:  ABG at 5:00 a.m. this morning on two liters, his pH was 7.39,  PCO2 53, PO2 118, saturating at 98%.  His white count was 13.9 which is  decreased.  His hemoglobin is 11.2, his platelet count 159.  His sodium is  138, potassium  3.7, chloride 101, CO2 31, glucose 95, BUN 17, creatinine 1.  His calcium was 8.9.  Liver functions were essentially normal.   Chest x-ray, repeated this morning, showed central bronchitic changes,  stable.  No effusions.  No acute infiltrates.   FOLLOW-UP:  Patient is to follow up with primary care physician, Dr.  Syliva Overman, and with pulmonologist,  Dr. Kari Baars.   SPECIAL INSTRUCTIONS:  He is to return to the emergency room for difficulty  breathing and he is to stop smoking.     Leop   LC/MEDQ  D:  12/26/2003  T:  12/27/2003  Job:  [id number]

## 2010-05-25 NOTE — H&P (Signed)
NAME:  [name], [name]                          ACCOUNT NO.:  [id number]   MEDICAL RECORD NO.:  [id number]                   PATIENT TYPE:  INP   LOCATION:  A225                                 FACILITY:  [facility]   PHYSICIAN:  Hanley Hays. Dechurch, M.D.           DATE OF BIRTH:  Jan 05, 1951   DATE OF ADMISSION:  09/09/2002  DATE OF DISCHARGE:                                HISTORY & PHYSICAL   HISTORY OF PRESENT ILLNESS:  A 60 year old African-American gentleman with  no primary medical doctor who presents with a one-week history of nausea,  vomiting, and diarrhea which he describes as black and coffee ground in  nature but no frank blood.  He denies any reflux symptoms, no substernal  pain; no chest pain, shortness of breath or other complaints.  He notes he  has been a little fatigued recently but no more than usual.  He is quite  anxious but he relates that to being in the emergency room.  He has a remote  history of alcohol abuse, quite heavy in the past but quitting 10 years ago  though he still drinks on the weekends, but has had none in three weeks.  The last time he drank he said he had two shots.  He has no history of DTs,  seizures, or blackouts.  Old records are pending.  He apparently was  admitted about 10 years ago with abdominal pain and GI bleeding but he is  unable to give me specifics of that admission.  He currently denies any  abdominal pain except cramping with stools.  His nausea currently has  subsided.  He has been able to drink liquids but has not been able to  maintain any solid foods.  He was seen in the public health department today  where he was noted to be hemodynamically stable but his hemoglobin was 9.7.  He was referred to the emergency room for further evaluation.  The patient  denies any medications including over-the-counter medications, herbal  supplements, or other.  He has no allergies.   SOCIAL HISTORY:  He is widowed.  He has one son but he is not  in contact  with him.  Apparently he has some remote family in the area but no one with  regular contact.  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  He smokes one-and-a-half  packs per day and has done so for 40 years.  Alcohol:  Remote, heavy in the  past, but limited recently.  Occasional marijuana use about once per week.  No cocaine or other street drugs.  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  He is illiterate though he had an 11th grade education.   REVIEW OF SYSTEMS:  No weight loss.  States he has a good appetite.  He has  never been heavy.  He has no GU, endo, or neurologic complaints.  He has  noted only nocturnal symptoms with his diarrhea, or more diarrhea during  night than daytime.   FAMILY HISTORY:  Unknown.  Parents are deceased.   PAST MEDICAL HISTORY:  Apparently had GI workup that was performed 10 years  ago and he was told to stop drinking at that time and did.  No surgeries.   PHYSICAL EXAMINATION:  GENERAL:  Reveals a thin, black male in no distress,  well-developed, well-nourished.  VITAL SIGNS:  Blood pressure 130/70, pulse is 100 and regular, no  orthostasis.  HEENT:  He is edentulous.  Dentures are intact.  No lesions in the  oropharyngeal area.  NECK:  Supple.  No JVD, adenopathy, or thyromegaly.  LUNGS:  Clear to auscultation anterior and posteriorly.  HEART:  Regular rate and rhythm.  He has a 1/6 systolic murmur at the left  sternal border, no gallop.  ABDOMEN:  Flat, soft, nontender.  No masses, hepatosplenomegaly, or bruits  are noted.  He does have a left carotid bruit.  RECTAL:  Per ER physician heme positive black stool, no blood.  Prostate not  enlarged.  EXTREMITIES:  Without clubbing, cyanosis, or edema.  He has good distal  pulses.  SKIN:  Without rash, lesion, or breakdown.  NEUROLOGIC:  Intact.   ASSESSMENT AND PLAN:  1. Gastrointestinal bleed, hemoglobin 9.3, hemodynamically stable.  Given     his symptomatology and continued  nausea we will admit, begin Protonix,     upright abdominal film, and monitor.  Will ask GI to see regarding need     for any further evaluation.  If he is stable may defer.  2. Left carotid bruit.  May be a flow bruit.  He gives no symptoms referable     to this finding, i.e. TIA, orthostasis, etc.  Once #1 evaluated consider     further evaluation.  3. Ongoing tobacco abuse.  Apparently does not have much problem with the     chronic bronchitis.  Will monitor.  Counseled on smoking cessation.                                               Hanley Hays Josefine Class, M.D.    FED/MEDQ  D:  09/09/2002  T:  09/09/2002  Job:  [id number]

## 2010-05-25 NOTE — H&P (Signed)
NAMEMARCAS, BOWSHER NO.:  [id number]   MEDICAL RECORD NO.:  [id number]          PATIENT TYPE:  INP   LOCATION:  IC09                          FACILITY:  [facility]   PHYSICIAN:  Vania Rea, M.D. DATE OF BIRTH:  21-Mar-1950   DATE OF ADMISSION:  12/23/2003  DATE OF DISCHARGE:  LH                                HISTORY & PHYSICAL   PRIMARY CARE PHYSICIAN:  Milus Mallick. Lodema Hong, M.D.   CHIEF COMPLAINT:  Acute respiratory failure.   HISTORY OF PRESENT ILLNESS:  This is a 60 year old African-American man with  a history of asthma, tobacco abuse, and peptic ulcer disease who apparently  has been having exacerbation of his asthma for the past few days, unrelieved  by nebulization at home, and came to the emergency room where he was started  on nebulizers, received oral prednisone, but apparently became suddenly  worse and had to be emergently intubated and sedated.  The patient is  currently on the ventilator, and history is being taken from the emergency  room physician and his old records.   PAST MEDICAL HISTORY:  1.  Peptic ulcer disease and erosive esophagitis.  2.  Recurrent upper GI bleed.  3.  Status post endoscopy November 15 which revealed complete healing of his      three gastric ulcers with normal esophagus and D1 and D2.  4.  Pulmonary function tests done August 2005 revealed more consistent with      asthma than COPD.  5.  Tobacco abuse.   MEDICATIONS:  Only known medication is Advair and nebulizers, exact details  unknown.   ALLERGIES:  No known drug allergies.   SOCIAL HISTORY:  He is widowed; his wife died of a carcinoma four years ago.  He has not seen his only son in a while.  He smokes one to one and one-half  packs for the past 30 years.  He quit excessive drinking a few years ago and  apparently now drinks one or two drinks over the weekend.  Up to August of  this year, he was unemployed.  He is trained as a Health visitor.   FAMILY HISTORY:  He has two brothers and one sister in good health.  His  father died at age 11, cause unknown.   REVIEW OF SYSTEMS:  Unable to obtain due to patient's current status.   PHYSICAL EXAMINATION:  GENERAL:  Middle-aged African-American man lying in  the stretcher, intubated.  VITAL SIGNS:  Admission temperature recorded at 97.9, pulse 110,  respirations 36, blood pressure 141/88.  He was saturating at 94% on room  air.  Currently on the ventilator, his pulse rate is 128.  His blood  pressure on ventilator rose to 188/100.  He is on FIO2 100%, tidal volume  450, PEEP 5, rate 12.  HEENT:  Pupils are dilated.  His eyes seem to be somewhat bulging.  His neck  veins are distended.  CHEST:  Diffuse wheezing bilaterally.  CARDIOVASCULAR:  Tachycardic.  ABDOMEN: Soft and nontender.  EXTREMITIES:  Without edema.   LABORATORY DATA AND  OTHER STUDIES:  White count 4.3, hemoglobin 12.3,  hematocrit 37, MCV 88.8, RDW 13.8, platelets 194.  ABG on 3 liters shows pH  7.32, PCO2 of 51, PO2 of 119, saturating 97.9%.  Serum chemistries were  essentially normal with Sodium 139, potassium 3.9, chloride 109,. CO2 27,  glucose 115, BUN 9, creatinine 0.9, calcium 9.1.   Chest x-ray shows hyperinflation consistent with COPD versus asthma.  No  evidence of acute disease.   Pulsed intubation chest x-ray shows ET tube in satisfactory position and  again no acute disease.   IMPRESSION:  1.  Acute respiratory failure requiring intubation.  2.  Acute exacerbation of asthma causing #1.  3.  History of peptic ulcer disease.   PLAN:  1.  Will keep this gentleman paralyzed and heavily sedated.  2.  Will give him high-dose steroids and nebulizers every 3 hours.  3.  If he continues to be tachycardic, we will change from albuterol to      Xopenex.  4.  Will ensure he has DVT prophylaxis and twice daily PPIs in view of his      history of GI bleed.  5.  Will place an NG tube and begin  NG tube feeds.     Leop   LC/MEDQ  D:  12/23/2003  T:  12/23/2003  Job:  [id number]   cc:   Milus Mallick. Lodema Hong, M.D.  [address]  [place], [place] [address]  Fax: [phone]

## 2010-05-25 NOTE — Discharge Summary (Signed)
NAME:  [name], [name]                          ACCOUNT NO.:  [id number]   MEDICAL RECORD NO.:  [id number]                   PATIENT TYPE:  INP   LOCATION:  A308                                 FACILITY:  [facility]   PHYSICIAN:  Lionel December, M.D.                 DATE OF BIRTH:  03-Mar-1950   DATE OF ADMISSION:  08/22/2003  DATE OF DISCHARGE:  08/25/2003                                 DISCHARGE SUMMARY   ADMISSION DIAGNOSES:  1. Upper gastrointestinal bleeding secondary to three gastric ulcers and     erosive esophagitis.  2. Mild hypokalemia and asthma.   DISCHARGE DIAGNOSES:  1. Gastrointestinal bleeding secondary to three gastric ulcers, one which     was quite large resulting in mild anemia.  2. Hypokalemia.  3. Asthma.   SERVICE:  Dr. Lionel December.   PROCEDURES:  On August 22, 2003, prior to admission, the patient had an EGD  by Dr. Karilyn Cota which revealed three gastric ulcers at the antrum.  There was  a large amount of dilute coffee-ground material in the stomach indicating  gastric outlet obstruction secondary to antral pyloric channel spasm  secondary to peptic ulcer disease.  A few distal esophageal erosions.  Approximately 700 cc of coffee-ground material was suctioned out of his  stomach.  He had an ulcer at the angularis measuring 2 cm in diameter with a  black eschar.  There was no visible blood vessel.  The largest ulcer  measured 15 cm wide and over 3 cm long.  There was no visible blood vessel  or active bleeding noted as well.   Abdominal ultrasound revealed tiny right renal cysts.  A 2.4 cm diameter  probable central renal calculus in the left kidney without gross  hydronephrosis.  Gallbladder without stones or wall thickening.  Common bile  duct diameter normal at 4 mm.  Pancreas unremarkable.   HISTORY OF PRESENT ILLNESS:  The patient is a 60 year old African American  male with a history of peptic ulcer disease who was in his usual state of  health until the  day prior to admission when he was drinking some liquor and  he became sick.  He developed epigastric pain and started to vomit coffee-  ground material.  On the day of admission, he was still vomiting and having  pain across the upper abdomen.  He came to the emergency department.  Hemoglobin was normal at 14.4.  LFTs were normal.  Lipase was 26.  Amylase  was not checked.  Serum potassium was 3.2.  Because of hematemesis, he was  brought over to the day hospital for EGD by Dr. Karilyn Cota.  EGD findings as  outlined above.  Because of severity and degree of the ulcers, he was  hospitalized for IV therapy.   The patient has a history of peptic ulcer disease and a GI bleed in  September of 2004.  He received  three units of packed red blood cells.  He  had two gastric ulcers in the antrum.  H. pylori serologies were positive.  He received Prevpac for two weeks.  Followup EGD in February of 2005  revealed that these ulcers had healed completely.  He still had some  gastritis.  Therefore, he underwent H. pylori breath test which was  negative.   HOSPITAL COURSE:  Gastrointestinal bleeding secondary to multiple gastric  ulcers.  After EGD findings as outlined above, the patient was admitted for  IV fluids with IV PPI.  He received IV Protonix 40 mg q.12h for a total of  three days.  Then he was switched to Protonix 40 mg p.o. b.i.d.  He was  hydrated with fluids with potassium supplement, given mild hypokalemia.  On  day #2 of hospitalization, his hemoglobin had dropped to 12.2.  There as no  evidence of overt GI bleeding.  The patient had not had any more vomiting or  had any bowel movements.  Gastrin level was checked; however, at the time of  discharge, the results were still pending.  The patient also had INR checked  which was normal at 1.1.  He underwent an abdominal ultrasound with findings  as outlined above.  He remained on a clear liquid diet.   On day #2 of hospitalization, his  hemoglobin had dropped to 11.6, but again,  there was no evidence of overt GI bleeding.  He was noted during the  hospital stay to have an elevated amylase which improved over the course of  a couple of days.  On August 24, 2003, was 155.  It was felt that this was  most likely a nonspecific finding due to gastric ulcers rather than  pancreatitis.  He notably had a normal lipase level.  The patient during his  hospital stay had no further vomiting and denied any abdominal pain.  His  diet was advanced, and this was tolerated.  He has a Engineer, site  consultation for assistance with receiving his medications.   LABORATORY DATA:  On August 23, 2003, WBC 6.6, platelets 202,000.  INR 1.1,  PT 13.6, PTT 28.  Sodium 141, potassium 3.7, chloride 108.  CO2 30.  Glucose  118.  BUN 8, creatinine 0.9.  On August 24, 2003, hemoglobin 11.6,  hematocrit 33.7.  Amylase 155 (down from 404).  On August 22, 2003, total  bilirubin was 0.6.  Alkaline phosphatase 73, SGOT 22, SGPT 17, albumin 4.4.  Lipase 26.  Gastrin level pending at the time of discharge.   DISCHARGE PHYSICAL EXAMINATION:  Temperature 98.3, pulse 64, respirations  20, blood pressure 119/71.  GENERAL:  A pleasant, thin  black male, in no  acute distress.  Skin warm and dry, no jaundice.  Chest:  Scattered rhonchi  throughout, good airway movement.  Cardiac exam reveals regular rate and  rhythm, no murmurs, rubs or gallops.  Abdomen, positive bowel sounds, soft,  nontender, nondistended.  No organomegaly or masses.  Extremities, no edema.   DISCHARGE CONDITION:  Stable for discharge to home.   DISCHARGE MEDICATIONS:  1. He will resume his home medications.  2. In addition, he will begin Protonix 40 mg p.o. b.i.d.   DISCHARGE INSTRUCTIONS:  The patient was given information with regards to  how to set up appointment with the free clinic.  He is to go there on this Thursday or next Tuesday for a new patient visit in order to receive  his  medications.  He  is also going to apply for Medicaid through social  services.  I have told the patient in the interim, we will provide his  samples until he can get established with the free clinic.   The patient is to go by the free clinic either Thursday or next Tuesday for  a new-patient visit in order to establish care and for assistance with  medications.  He will apply for Medicaid as recommended.  If he develops any  abdominal pain, vomiting, black or bloody stools, he will notify the doctor  immediately, or go to the emergency department.   FOLLOWUP:  He has followup appointment scheduled at Dr. Patty Sermons office on  Tuesday, September 13, at 2:15 p.m.     _____________________________________  ___________________________________________  Tana Coast, P.A.                      Lionel December, M.D.   LL/MEDQ  D:  08/25/2003  T:  08/25/2003  Job:  [id number]   cc:   Lionel December, M.D.  [id number]  [place]  [place] [address]  Fax: [phone]

## 2010-05-25 NOTE — Op Note (Signed)
NAME:  [name], [name]                          ACCOUNT NO.:  [id number]   MEDICAL RECORD NO.:  [id number]                   PATIENT TYPE:  INP   LOCATION:  A308                                 FACILITY:  [facility]   PHYSICIAN:  Lionel December, M.D.                 DATE OF BIRTH:  Sep 14, 1950   DATE OF PROCEDURE:  08/22/2003  DATE OF DISCHARGE:                                 OPERATIVE REPORT   PROCEDURE:  Esophagogastroduodenoscopy.   INDICATION:  [name] is a 60 year old, African-American male, with history of  gastric ulcers which are documented as healed completely in February this  year, presents to the emergency room with epigastric pain of two days'  duration with hematemesis.  He is undergoing diagnostic and possibly  therapeutic EGD.  Procedure risks were reviewed with the patient.  Informed  consent was obtained.   PREOPERATIVE MEDICATIONS:  1. Cetacaine spray for pharyngeal topical anesthesia.  2. Demerol 25 mg IV.  3. Versed 5 mg IV in divided dose.   FINDINGS:  Procedure performed in endoscopy suite.  The patient's vital  signs and O2 saturations were monitored during the procedure and remained  stable.  Patient was placed in left lateral decubitus position, and Olympus  endoscope was passed via oropharynx without any difficulty into esophagus.   ESOPHAGUS:  Mucosa of the proximal and middle third was normal.  Distally,  there was some mucosal erythema and few erosions.  There was also a small  sliding hiatal hernia.   STOMACH:  It had a large amount of dilute coffee-ground.  All of this was  suctioned out and amounted to 700 mL.  Gastric folds appeared to be normal.  Three ulcers are noted distal stomach.  One was at angularis, measuring  about 2 cm in diameter.  It had some black eschar.  On washing, there was no  visible blood vessel.  There was a smaller ulcer at antrum along the  __________ wall.  The larger ulcer was more than half a donut, extending  from anterior  wall to the right as was left.  It was at least 15 cm wide and  over 3 cm long.  It had some coffee-ground material.  It was washed, and  there were no visible blood vessels or active bleeding.  Therefore, no  therapeutic intervention was undertaken.  Pyloric channel was patent.  Fundus and cardia were normal.   DUODENUM:  Examination of the bulb and the postbulbar duodenum was normal.  The endoscope was withdrawn.  The patient tolerated the procedure well.   FINAL DIAGNOSES:  1. Three gastric ulcers at antrum.  2. Large amount of dilute coffee-ground in the stomach indicating gastric     outlet obstruction secondary to antral pyloric channel spasm secondary to     peptic ulcer disease.   Please note that patient has been treated with Prevpac last year and his H.  pylori breath test 6 months ago was negative.  He denies using NSAIDs.   PLAN:  1. He will be admitted to the hospital, given IV fluids, double-dose proton     pump inhibitor intravenously.  2. H. Pylori serology will be checked.  3. We will also check a serum amylase.  H&H will be repeated later this     evening and in the a.m.      ___________________________________________                                            Lionel December, M.D.   NR/MEDQ  D:  08/22/2003  T:  08/22/2003  Job:  [id number]

## 2010-05-25 NOTE — H&P (Signed)
NAME:  [name], [name]                          ACCOUNT NO.:  [id number]   MEDICAL RECORD NO.:  [id number]                   PATIENT TYPE:  INP   LOCATION:  A308                                 FACILITY:  [facility]   PHYSICIAN:  [name], M.D.                 DATE OF BIRTH:  16-May-1950   DATE OF ADMISSION:  08/22/2003  DATE OF DISCHARGE:                                HISTORY & PHYSICAL   REASON FOR HOSPITALIZATION:  Upper GI bleed secondary to gastric ulcers.   HISTORY OF PRESENT ILLNESS:  [name] is a 60 year old African American male  who has a history of peptic ulcer disease.  He was in his usual state of  health until yesterday when after drinking some liquor, he became sick.  He  developed epigastric pain and started to vomit coffee-ground material.  By  this morning, he was still vomiting and having pain across his upper  abdomen.  He came to the emergency room.  He was evaluated by Dr. Rhae [name].  [name].  His H&H was normal at 14.4, and 42.1.  His LFT's were normal.  His lipase was 26, but amylase was not checked.  His serum potassium was  3.2.  Because of history of hematemesis, I was asked to see the patient.  The patient was evaluated and taken to [facility] for EGD.  He underwent  esophagogastroduodenoscopy.  He was noted to have erosive reflux esophagitis  and a small sliding hiatal hernia.  Dilute coffee-ground material, total of  700 milliliters was suctioned out.  He had three gastric ulcers, one large  one at angularis and another one in prepyloric area.  A third smaller one in  the antrum along the posterior wall.  Pyloric channel however was patent.  Fundus and cardia were normal.  With these findings, he was felt that he  needed to be hospitalized for IV therapy.   The patient has a history of peptic ulcer disease, but he was documented to  have complete healing of these ulcers on EGD performed in February of this  year.  Following that, he also had H pylori  breath test which was negative.  The patient denies taking over-the-counter NSAID's or herbal medications.  He is not sure if he has lost any weight.  However, according to records, he  has gained 17 pounds since he was hospitalized in September of 2004.  His  weight prior to that illness was around 150 pounds.  He has not had any  melena, fever, chills, chest pain or dyspnea.   MEDICATIONS:  His usual medications are:  1. Advair Diskus 500/50, one puff b.i.d.  2. Primatene p.r.n.   PAST MEDICAL HISTORY:  History of peptic ulcer disease.  He was initially  diagnosed perhaps 8 or 10 years ago.   He was admitted to this facility in September of 2004 with upper GI bleed.  He  received three units or packed red blood cells.  He had two gastric  ulcers at the antrum.  His pylori serology was positive.  He received  Prevpac for two weeks.  He had a followup EGD in February of 2005, and these  ulcers were noted to have healed completely.  He still has some gastritis.  Therefore, H pylori breath test was done and was negative.   He also had a screening colonoscopy in February of 2005 which was  unremarkable except two diverticula.   He has never had any surgeries in the past.   ALLERGIES:  None known.   FAMILY HISTORY:  Noncontributory.  He has two brothers and one sister who  are in good health.  Father lived to be in his 61's. Mother died at a young  age.  (when he was 60 years old). Cause unknown.   SOCIAL HISTORY:  He is widowed.  His wife died four years ago from  carcinoma.  He has one son, but has not seen him in a while.  He has been  smoking 1 to 1-1/2 packs for the last 30 years.  He states he used to drink  alcohol in excessive amounts, but he quit a few years ago, and now states he  only has a drink or two over the weekend.  He is presently unemployed.  He  works as a TEFL teacher.   PHYSICAL EXAMINATION:  GENERAL:  A well-developed, thin,  African-American  male who feels better since EGD was done.  Prior to that, he was holding an  emesis basin.  VITAL SIGNS:  Admission weight 130 pounds.  He is 5 feet, 7 inches tall.  Pulse 88 per minute.  Blood pressure 139/87.  Respirations 16.  Temperature 97.3.  Conjunctivae is  pink.  Sclerae is nonicteric.  Oropharyngeal mucosa is normal.  He is  edentulous.  NECK:  Without masses or thyromegaly.  CARDIAC:  Regular rhythm.  Normal S1 and S2.  No murmur or gallop is noted.  LUNGS:  Auscultation of the lungs reveals a few fine rhonchi at both bases.  ABDOMEN:  Flat.  Bowel sounds are normal.  Palpation reveals mild to  moderate tenderness in the midepigastrium.  No organomegaly or masses noted.  RECTAL:  Exam is deferred.  EXTREMITIES:  Thin but no clubbing noted.   LABORATORY DATA:  On admission, were done in the ER.  WBC 6.1.  H&H 14.4 and  42.1.  Platelets count 248,000.  Sodium 141, potassium 3.2, chloride 97, CO2  34.  Glucose 146.  BUN 8, creatinine 1.0.  Bilirubin 0.6.  Alkaline  phosphatase 73, AST 22, ALT 17.  Total protein 7.5 with albumin of 4.4.  Calcium is 9.5.  Serum amylase was added to his lab studies, elevated at  404.   ASSESSMENT:  [name] is a 60 year old African American male who presents with  upper GI bleed and epigastric pain.  Symptoms started more or less acutely  after he had small amount of liquor (by his account).  EGD reveals erosive  esophagitis and three gastric ulcers, one of which was quite large, and felt  to be the source of his bleed.   He could also have pancreatitis or elevated amylase may be nonspecific  secondary to peptic ulcer disease.   I am surprised to know that his ulcers have relapsed, even though he was  documented to have eradication of H pylori infection by breath test.  He  needs  to be evaluated to make sure he does not have a gastrinoma or  Zollinger-Ellison syndrome.   PLAN: 1. IV fluids with KCl supplement to correct his  hypokalemia.  2. Protonix 40 mg IV q.12h.  3. Upper abdominal ultrasound will be obtained in the a.m.  4. Hemoglobin and hematocrit will be repeated later today in the a.m.  5. He will definitely need a follow up EGD at some point to make sure that     these ulcers have healed again.     ___________________________________________                                         [name], M.D.   NR/MEDQ  D:  08/22/2003  T:  08/22/2003  Job:  [id number]

## 2010-05-29 LAB — HEPATIC FUNCTION PANEL
ALT: 19 U/L (ref 0–53)
Bilirubin, Direct: 0.1 mg/dL (ref 0.0–0.3)
Indirect Bilirubin: 0.3 mg/dL (ref 0.0–0.9)
Total Bilirubin: 0.4 mg/dL (ref 0.3–1.2)

## 2010-05-29 LAB — BASIC METABOLIC PANEL
BUN: 10 mg/dL (ref 6–23)
Calcium: 9.7 mg/dL (ref 8.4–10.5)
Chloride: 103 mEq/L (ref 96–112)
Creat: 0.84 mg/dL (ref 0.40–1.50)

## 2010-05-29 LAB — LIPID PANEL
Cholesterol: 208 mg/dL — ABNORMAL HIGH (ref 0–200)
HDL: 79 mg/dL (ref >39)
LDL Cholesterol: 110 mg/dL — ABNORMAL HIGH (ref 0–99)
Total CHOL/HDL Ratio: 2.6 Ratio
Triglycerides: 94 mg/dL (ref <150)
VLDL: 19 mg/dL (ref 0–40)

## 2010-05-30 ENCOUNTER — Encounter: Payer: Self-pay | Admitting: Family Medicine

## 2010-05-30 ENCOUNTER — Ambulatory Visit (INDEPENDENT_AMBULATORY_CARE_PROVIDER_SITE_OTHER): Payer: Medicare Other | Admitting: Family Medicine

## 2010-05-30 VITALS — BP 140/74 | Resp 16 | Ht 66.5 in | Wt 134.4 lb

## 2010-05-30 DIAGNOSIS — J45909 Unspecified asthma, uncomplicated: Secondary | ICD-10-CM

## 2010-05-30 DIAGNOSIS — Z23 Encounter for immunization: Secondary | ICD-10-CM

## 2010-05-30 DIAGNOSIS — I10 Essential (primary) hypertension: Secondary | ICD-10-CM

## 2010-05-30 DIAGNOSIS — E785 Hyperlipidemia, unspecified: Secondary | ICD-10-CM

## 2010-05-30 MED ORDER — ALBUTEROL SULFATE (2.5 MG/3ML) 0.083% IN NEBU: 2.5000 mg | INHALATION_SOLUTION | RESPIRATORY_TRACT | Status: DC

## 2010-05-30 MED ORDER — IPRATROPIUM BROMIDE 0.02 % IN SOLN: 500.0000 ug | RESPIRATORY_TRACT | Status: DC

## 2010-05-30 NOTE — Patient Instructions (Signed)
F/u in 4 months.  pls keep taking all your meds as you are taking them and keep appt with Dr Juanetta Gosling.   Pneumovac today.  Fasting chem 7 , lipid and hepatic in 4 months.

## 2010-06-04 NOTE — Assessment & Plan Note (Signed)
Unchanged and uncontrolled, no med change, pt counseled to reduce fat intake

## 2010-06-04 NOTE — Progress Notes (Signed)
  Subjective:    Patient ID: [name], male    DOB: Jan 27, 1950, 60 y.o.   [name]: [id number]  HPI Pt here for f/u of recent hospitalization for asthma flare. He has had multiple admissions in the past 6 months,with respiratory failure, seems to be doing better this time, now on prednisone with pulmonary f/u. Still smokes approx 3 ciggs daily. Denies productive cough. Chronic exertional  dyspnea and wheeze but reports less   Review of Systems Denies recent fever or chills. Denies sinus pressure, nasal congestion, ear pain or sore throat. Denies chest congestionor , productive cough  Denies chest pains, palpitations, paroxysmal nocturnal dyspnea, orthopnea and leg swelling Denies abdominal pain, nausea, vomiting,diarrhea or constipation.  Denies rectal bleeding or change in bowel movement. Denies dysuria, frequency, hesitancy or incontinence. Denies joint pain, swelling and limitation in mobility. Denies headaches, seizure, numbness, or tingling. Denies depression, anxiety or insomnia. Denies skin break down or rash.        Objective:   Physical Exam    Patient alert and oriented and in no Cardiopulmonary distress.Looks more healthy than in the past 6 months  HEENT: No facial asymmetry, EOMI, no sinus tenderness, TM's clear, Oropharynx pink and moist.  Neck supple no adenopathy.  Chest: decreased air entry, scattered wheezes CVS: S1, S2 no murmurs, no S3.  ABD: Soft non tender. Bowel sounds normal.  Ext: No edema  MS: Adequate ROM spine, shoulders, hips and knees.  Skin: Intact, no ulcerations or rash noted.  Psych: Good eye contact, normal affect. Memory intact not anxious or depressed appearing.  CNS: CN 2-12 intact, power, tone and sensation normal throughout.     Assessment & Plan:

## 2010-06-04 NOTE — Assessment & Plan Note (Signed)
Controlled, no change in medication  

## 2010-06-14 ENCOUNTER — Ambulatory Visit: Payer: Medicare Other | Admitting: Family Medicine

## 2010-06-22 ENCOUNTER — Emergency Department (HOSPITAL_COMMUNITY): Payer: Medicare Other

## 2010-06-22 ENCOUNTER — Emergency Department (HOSPITAL_COMMUNITY)
Admission: EM | Admit: 2010-06-22 | Discharge: 2010-06-22 | Disposition: A | Discharge status: Home / Self Care | Payer: Medicare Other | Attending: Emergency Medicine | Admitting: Emergency Medicine

## 2010-06-22 DIAGNOSIS — J4489 Other specified chronic obstructive pulmonary disease: Secondary | ICD-10-CM | POA: Insufficient documentation

## 2010-06-22 DIAGNOSIS — E785 Hyperlipidemia, unspecified: Secondary | ICD-10-CM | POA: Insufficient documentation

## 2010-06-22 DIAGNOSIS — J449 Chronic obstructive pulmonary disease, unspecified: Secondary | ICD-10-CM | POA: Insufficient documentation

## 2010-06-22 DIAGNOSIS — F411 Generalized anxiety disorder: Secondary | ICD-10-CM | POA: Insufficient documentation

## 2010-06-22 DIAGNOSIS — I10 Essential (primary) hypertension: Secondary | ICD-10-CM | POA: Insufficient documentation

## 2010-07-20 ENCOUNTER — Encounter (HOSPITAL_COMMUNITY): Payer: Self-pay | Admitting: Emergency Medicine

## 2010-07-20 ENCOUNTER — Emergency Department (HOSPITAL_COMMUNITY)
Admission: EM | Admit: 2010-07-20 | Discharge: 2010-07-20 | Disposition: A | Discharge status: Home / Self Care | Payer: Medicare Other | Attending: Emergency Medicine | Admitting: Emergency Medicine

## 2010-07-20 ENCOUNTER — Other Ambulatory Visit: Payer: Self-pay

## 2010-07-20 ENCOUNTER — Emergency Department (HOSPITAL_COMMUNITY): Payer: Medicare Other

## 2010-07-20 DIAGNOSIS — R0602 Shortness of breath: Secondary | ICD-10-CM | POA: Insufficient documentation

## 2010-07-20 DIAGNOSIS — Z8673 Personal history of transient ischemic attack (TIA), and cerebral infarction without residual deficits: Secondary | ICD-10-CM | POA: Insufficient documentation

## 2010-07-20 DIAGNOSIS — F17200 Nicotine dependence, unspecified, uncomplicated: Secondary | ICD-10-CM | POA: Insufficient documentation

## 2010-07-20 DIAGNOSIS — J441 Chronic obstructive pulmonary disease with (acute) exacerbation: Secondary | ICD-10-CM | POA: Insufficient documentation

## 2010-07-20 DIAGNOSIS — I10 Essential (primary) hypertension: Secondary | ICD-10-CM | POA: Insufficient documentation

## 2010-07-20 LAB — CBC
MCH: 30.2 pg (ref 26.0–34.0)
MCV: 91.6 fL (ref 78.0–100.0)
Platelets: 193 10*3/uL (ref 150–400)
RBC: 4.67 MIL/uL (ref 4.22–5.81)
RDW: 12.6 % (ref 11.5–15.5)

## 2010-07-20 LAB — BASIC METABOLIC PANEL
CO2: 32 mEq/L (ref 19–32)
Calcium: 9.9 mg/dL (ref 8.4–10.5)
Creatinine, Ser: 0.79 mg/dL (ref 0.50–1.35)
GFR calc Af Amer: >60 mL/min (ref >60)
GFR calc non Af Amer: >60 mL/min (ref >60)
Sodium: 142 mEq/L (ref 135–145)

## 2010-07-20 LAB — TROPONIN I: Troponin I: <0.3 ng/mL (ref <0.30)

## 2010-07-20 MED ORDER — ALBUTEROL SULFATE (2.5 MG/3ML) 0.083% IN NEBU
INHALATION_SOLUTION | RESPIRATORY_TRACT | Status: AC
  Administered 2010-07-20: 2.5 mg
  Filled 2010-07-20: qty 3

## 2010-07-20 MED ORDER — ALBUTEROL SULFATE (2.5 MG/3ML) 0.083% IN NEBU: 5.0000 mg | INHALATION_SOLUTION | Freq: Once | RESPIRATORY_TRACT | Status: DC

## 2010-07-20 MED ORDER — PREDNISONE 10 MG PO TABS: 60.0000 mg | ORAL_TABLET | Freq: Every day | ORAL | Status: AC

## 2010-07-20 MED ORDER — ALBUTEROL SULFATE HFA 108 (90 BASE) MCG/ACT IN AERS: 2.0000 | INHALATION_SPRAY | RESPIRATORY_TRACT | Status: DC | PRN

## 2010-07-20 MED ORDER — ALBUTEROL SULFATE (5 MG/ML) 0.5% IN NEBU
5.0000 mg | INHALATION_SOLUTION | Freq: Once | RESPIRATORY_TRACT | Status: AC
  Administered 2010-07-20: 5 mg via RESPIRATORY_TRACT

## 2010-07-20 MED ORDER — PREDNISONE 20 MG PO TABS
60.0000 mg | ORAL_TABLET | Freq: Once | ORAL | Status: AC
  Administered 2010-07-20: 60 mg via ORAL
  Filled 2010-07-20: qty 3

## 2010-07-20 MED ORDER — ALBUTEROL SULFATE (2.5 MG/3ML) 0.083% IN NEBU
INHALATION_SOLUTION | RESPIRATORY_TRACT | Status: AC
  Administered 2010-07-20: 13:00:00
  Filled 2010-07-20: qty 6

## 2010-07-20 MED ORDER — IPRATROPIUM BROMIDE 0.02 % IN SOLN
0.5000 mg | Freq: Once | RESPIRATORY_TRACT | Status: AC
  Administered 2010-07-20: 0.5 mg via RESPIRATORY_TRACT
  Filled 2010-07-20: qty 2.5

## 2010-07-20 NOTE — ED Notes (Signed)
Pt c/o sob since 0830 this am. No relief from breathing tx pta. Denies cp.

## 2010-07-20 NOTE — ED Notes (Signed)
Pt states he doesn't feel any better since receiving last breathing tx. Diminished with mild expiratory wheeze.

## 2010-07-20 NOTE — ED Provider Notes (Addendum)
History     Chief Complaint  Patient presents with  . Shortness of Breath   Patient is a 60 y.o. male presenting with shortness of breath. The history is provided by the patient.  Shortness of Breath  The current episode started today. The problem has been gradually worsening. The problem is moderate. The symptoms are relieved by nothing (not improved by beta agonist inhalers). The symptoms are aggravated by activity. Associated symptoms include cough, shortness of breath and wheezing. Pertinent negatives include no chest pain, no chest pressure, no orthopnea, no fever and no sore throat. The cough has no precipitants. The cough is productive. Nothing worsens the cough. Past medical history comments: COPD. There were no sick contacts.    Past Medical History  Diagnosis Date  . Nicotine addiction   . COPD (chronic obstructive pulmonary disease)   . Depression   . Hyperlipidemia   . Hypertension   . CVA (cerebral vascular accident) 2008    with temporary vision loss   . Asthma     History reviewed. No pertinent past surgical history.  Family History  Problem Relation Age of Onset  . Lung disease Sister     History  Substance Use Topics  . Smoking status: Current Everyday Smoker -- 0.3 packs/day    Types: Cigarettes  . Smokeless tobacco: Not on file  . Alcohol Use: No      Review of Systems  Constitutional: Negative for fever.  HENT: Negative for sore throat.   Respiratory: Positive for cough, shortness of breath and wheezing.   Cardiovascular: Negative for chest pain and orthopnea.  All other systems reviewed and are negative.    Physical Exam  BP 167/88  Pulse 129  Temp(Src) 98.3 F (36.8 C) (Oral)  Resp 24  Ht 5\' 7"  (1.702 m)  Wt 130 lb (58.968 kg)  BMI 20.36 kg/m2  SpO2 95%  Physical Exam  Nursing note and vitals reviewed. Constitutional: He is oriented to person, place, and time. He appears well-developed and well-nourished.  HENT:  Head:  Normocephalic and atraumatic.  Eyes: EOM are normal.  Neck: Normal range of motion.  Cardiovascular: Normal rate, regular rhythm, normal heart sounds and intact distal pulses.   Pulmonary/Chest: Effort normal. He has wheezes. He has no rales. He exhibits no tenderness.  Abdominal: Soft. He exhibits no distension. There is no tenderness.  Musculoskeletal: Normal range of motion. He exhibits no edema.  Neurological: He is alert and oriented to person, place, and time.  Skin: Skin is warm and dry.  Psychiatric: He has a normal mood and affect. Judgment normal.    ED Course  Procedures  MDM  Pt with COPD exacerbation. significant improvement in breathing after steroids and two resp treatments in the ER. No PNA. Labs normal. Will dc home with close pcp follow up  I reviewed all labs and imaging completed today. I personally reviewed the images.     Results for orders placed during the hospital encounter of 07/20/10  CBC      Component Value Range   WBC 3.5 (*) 4.0 - 10.5 (K/uL)   RBC 4.67  4.22 - 5.81 (MIL/uL)   Hemoglobin 14.1  13.0 - 17.0 (g/dL)   HCT 26.9  48.5 - 46.2 (%)   MCV 91.6  78.0 - 100.0 (fL)   MCH 30.2  26.0 - 34.0 (pg)   MCHC 32.9  30.0 - 36.0 (g/dL)   RDW 70.3  50.0 - 93.8 (%)   Platelets 193  150 -  400 (K/uL)  BASIC METABOLIC PANEL      Component Value Range   Sodium 142  135 - 145 (mEq/L)   Potassium 3.5  3.5 - 5.1 (mEq/L)   Chloride 103  96 - 112 (mEq/L)   CO2 32  19 - 32 (mEq/L)   Glucose, Bld 112 (*) 70 - 99 (mg/dL)   BUN 10  6 - 23 (mg/dL)   Creatinine, Ser 1.61  0.50 - 1.35 (mg/dL)   Calcium 9.9  8.4 - 09.6 (mg/dL)   GFR calc non Af Amer >60  >60 (mL/min)   GFR calc Af Amer >60  >60 (mL/min)  TROPONIN I      Component Value Range   Troponin I <0.30  <0.30 (ng/mL)           Lyanne Co, MD 07/20/10 1349  Lyanne Co, MD 08/19/10 2203

## 2010-07-20 NOTE — ED Notes (Signed)
Pt currently receiving 2nd breathing tx. VSS. NAD.

## 2010-08-17 ENCOUNTER — Ambulatory Visit: Payer: Medicare Other | Admitting: Family Medicine

## 2010-08-27 ENCOUNTER — Other Ambulatory Visit: Payer: Self-pay | Admitting: *Deleted

## 2010-08-27 MED ORDER — ALBUTEROL SULFATE (2.5 MG/3ML) 0.083% IN NEBU: 2.5000 mg | INHALATION_SOLUTION | RESPIRATORY_TRACT | Status: DC

## 2010-08-27 MED ORDER — IPRATROPIUM BROMIDE 0.02 % IN SOLN: 500.0000 ug | RESPIRATORY_TRACT | Status: DC

## 2010-09-04 ENCOUNTER — Other Ambulatory Visit: Payer: Self-pay | Admitting: Family Medicine

## 2010-10-01 ENCOUNTER — Encounter: Payer: Self-pay | Admitting: Family Medicine

## 2010-10-02 ENCOUNTER — Ambulatory Visit (INDEPENDENT_AMBULATORY_CARE_PROVIDER_SITE_OTHER): Payer: Medicare Other | Admitting: Family Medicine

## 2010-10-02 ENCOUNTER — Encounter: Payer: Self-pay | Admitting: Family Medicine

## 2010-10-02 VITALS — BP 144/102 | HR 105 | Resp 16 | Ht 67.0 in | Wt 132.1 lb

## 2010-10-02 DIAGNOSIS — E785 Hyperlipidemia, unspecified: Secondary | ICD-10-CM

## 2010-10-02 DIAGNOSIS — F17200 Nicotine dependence, unspecified, uncomplicated: Secondary | ICD-10-CM

## 2010-10-02 DIAGNOSIS — R5383 Other fatigue: Secondary | ICD-10-CM

## 2010-10-02 DIAGNOSIS — J449 Chronic obstructive pulmonary disease, unspecified: Secondary | ICD-10-CM

## 2010-10-02 DIAGNOSIS — R7301 Impaired fasting glucose: Secondary | ICD-10-CM

## 2010-10-02 DIAGNOSIS — J45909 Unspecified asthma, uncomplicated: Secondary | ICD-10-CM

## 2010-10-02 DIAGNOSIS — I10 Essential (primary) hypertension: Secondary | ICD-10-CM

## 2010-10-02 DIAGNOSIS — R5381 Other malaise: Secondary | ICD-10-CM

## 2010-10-02 DIAGNOSIS — Z23 Encounter for immunization: Secondary | ICD-10-CM

## 2010-10-02 DIAGNOSIS — Z125 Encounter for screening for malignant neoplasm of prostate: Secondary | ICD-10-CM

## 2010-10-02 MED ORDER — ALBUTEROL SULFATE (2.5 MG/3ML) 0.083% IN NEBU
2.5000 mg | INHALATION_SOLUTION | Freq: Once | RESPIRATORY_TRACT | Status: AC
  Administered 2010-10-02: 2.5 mg via RESPIRATORY_TRACT

## 2010-10-02 MED ORDER — IPRATROPIUM BROMIDE 0.02 % IN SOLN
0.5000 mg | Freq: Once | RESPIRATORY_TRACT | Status: AC
  Administered 2010-10-02: 0.5 mg via RESPIRATORY_TRACT

## 2010-10-02 MED ORDER — SIMVASTATIN 40 MG PO TABS: 40.0000 mg | ORAL_TABLET | Freq: Every day | ORAL | Status: DC

## 2010-10-02 MED ORDER — METHYLPREDNISOLONE ACETATE 80 MG/ML IJ SUSP
120.0000 mg | Freq: Once | INTRAMUSCULAR | Status: AC
  Administered 2010-10-02: 120 mg via INTRAMUSCULAR

## 2010-10-02 MED ORDER — INFLUENZA VAC TYPES A & B PF IM SUSP: 0.5000 mL | Freq: Once | INTRAMUSCULAR | Status: DC

## 2010-10-02 MED ORDER — PREDNISONE (PAK) 5 MG PO TABS: 5.0000 mg | ORAL_TABLET | ORAL | Status: DC

## 2010-10-02 NOTE — Patient Instructions (Addendum)
CPE in 2 months.  You will get a breathing treatment and steroid injection in the office today, also prednisone is sent to your pharmacy .  Flu vaccine today.  Fasting labs approx 5 days before next visit, in November.  You need to take ONE WHOLE blood pressure pill every day, pressure is high   Please think about quitting smoking.  This is very important for your health.  Consider setting a quit date, then cutting back or switching brands to prepare to stop.  Also think of the money you will save every day by not smoking.  Quick Tips to Quit Smoking: Fix a date i.e. keep a date in mind from when you would not touch a tobacco product to smoke  Keep yourself busy and block your mind with work loads or reading books or watching movies in malls where smoking is not allowed  Vanish off the things which reminds you about smoking for example match box, or your favorite lighter, or the pipe you used for smoking, or your favorite jeans and shirt with which you used to enjoy smoking, or the club where you used to do smoking  Try to avoid certain people places and incidences where and with whom smoking is a common factor to add on  Praise yourself with some token gifts from the money you saved by stopping smoking  Anti Smoking teams are there to help you. Join their programs  Anti-smoking Gums are there in many medical shops. Try them to quit smoking   Side-effects of Smoking: Disease caused by smoking cigarettes are emphysema, bronchitis, heart failures  Premature death  Cancer is the major side effect of smoking  Heart attacks and strokes are the quick effects of smoking causing sudden death  Some smokers lives end up with limbs amputated  Breathing problem or fast breathing is another side effect of smoking  Due to more intakes of smokes, carbon mono-oxide goes into your brain and other muscles of the body which leads to swelling of the veins and blockage to the air passage to lungs  Carbon  monoxide blocks blood vessels which leads to blockage in the flow of blood to different major body organs like heart lungs and thus leads to attacks and deaths  During pregnancy smoking is very harmful and leads to premature birth of the infant, spontaneous abortions, low weight of the infant during birth  Fat depositions to narrow and blocked blood vessels causing heart attacks  In many cases cigarette smoking caused infertility in men

## 2010-10-02 NOTE — Assessment & Plan Note (Signed)
Unchanged, counseled to quit 

## 2010-10-02 NOTE — Assessment & Plan Note (Signed)
Medication compliance addressed. Commitment to regular exercise and healthy  food choices, with portion control discussed. DASH diet and low fat diet discussed and literature offered. Changes in medication made at this visit.  

## 2010-10-02 NOTE — Progress Notes (Signed)
Addended by: Adella Hare B on: 10/02/2010 11:39 AM   Modules accepted: Orders

## 2010-10-02 NOTE — Assessment & Plan Note (Signed)
Currently having a flare, depo medrol, neb treatment and dose pack

## 2010-10-02 NOTE — Assessment & Plan Note (Signed)
Hyperlipidemia:Low fat diet discussed and encouraged.    Fasting labs before next visit 

## 2010-10-02 NOTE — Progress Notes (Signed)
Addended by: Adella Hare B on: 10/02/2010 11:25 AM   Modules accepted: Orders

## 2010-10-02 NOTE — Progress Notes (Signed)
  Subjective:    Patient ID: [name], male    DOB: 01-30-1950, 60 y.o.   [name]: [id number]  HPI The PT is here for follow up and re-evaluation of chronic medical conditions, medication management and review of any available recent lab and radiology data.  Preventive health is updated, specifically  Cancer screening and Immunization.   He reports increased wheezing and shortness of breath in the past 2 to 3 weeks. He is out of his [place] MDI.He denies fever , chills, sinus pressure or productive cough. He has been breaking his antihypertensive med in half states the hospital told hiim to do this, his BP is high. Still smokes cigarettes and marijuana.      Review of Systems See HPI Denies recent fever or chills. Denies sinus pressure, nasal congestion, ear pain or sore throat. Denies chest congestion or  productive cough. Reports increased shortness of breath in the past several weeks  Denies chest pains, palpitations and leg swelling Denies abdominal pain, nausea, vomiting,diarrhea or constipation.   Denies dysuria, frequency, hesitancy or incontinence. Denies joint pain, swelling and limitation in mobility. Denies headaches, seizures, numbness, or tingling. Denies depression, anxiety or insomnia. Denies skin break down or rash.        Objective:   Physical Exam Patient alert and oriented and in no cardiopulmonary distress.  HEENT: No facial asymmetry, EOMI, no sinus tenderness,  oropharynx pink and moist.  Neck supple no adenopathy.  Chest: Markedly reduced air entry throughout with bilateral wheezes.No crackles  CVS: S1, S2 no murmurs, no S3.  ABD: Soft non tender. Bowel sounds normal.  Ext: No edema  MS: Adequate ROM spine, shoulders, hips and knees.  Skin: Intact, no ulcerations or rash noted.  Psych: Good eye contact, normal affect. Memory intact not anxious or depressed appearing.  CNS: CN 2-12 intact, power, tone and sensation normal  throughout.        Assessment & Plan:

## 2010-10-11 ENCOUNTER — Other Ambulatory Visit: Payer: Self-pay

## 2010-10-11 MED ORDER — ALBUTEROL SULFATE HFA 108 (90 BASE) MCG/ACT IN AERS: 2.0000 | INHALATION_SPRAY | RESPIRATORY_TRACT | Status: DC | PRN

## 2010-10-16 LAB — DIFFERENTIAL
Basophils Absolute: 0
Basophils Absolute: 0
Basophils Relative: 0
Basophils Relative: 1
Eosinophils Absolute: 0 — ABNORMAL LOW
Eosinophils Relative: 0
Lymphocytes Relative: 42
Lymphocytes Relative: 9 — ABNORMAL LOW
Lymphs Abs: 0.5 — ABNORMAL LOW
Lymphs Abs: 1.5
Monocytes Absolute: 0.3
Monocytes Relative: 7
Monocytes Relative: 9
Monocytes Relative: 9
Neutro Abs: 1.6 — ABNORMAL LOW
Neutro Abs: 2.6
Neutrophils Relative %: 57
Neutrophils Relative %: 84 — ABNORMAL HIGH

## 2010-10-16 LAB — HEPATIC FUNCTION PANEL
AST: 22
Bilirubin, Direct: <0.1
Total Protein: 7

## 2010-10-16 LAB — COMPREHENSIVE METABOLIC PANEL
CO2: 30
Calcium: 8.4
Creatinine, Ser: 0.94
GFR calc non Af Amer: >60
Glucose, Bld: 91

## 2010-10-16 LAB — BASIC METABOLIC PANEL
BUN: 7
Calcium: 9.1
Calcium: 9.1
Creatinine, Ser: 0.83
Creatinine, Ser: 0.85
GFR calc Af Amer: >60
GFR calc Af Amer: >60
GFR calc non Af Amer: >60

## 2010-10-16 LAB — CBC
HCT: 36.3 — ABNORMAL LOW
MCV: 91.6
Platelets: 140 — ABNORMAL LOW
Platelets: 158
RBC: 4.03 — ABNORMAL LOW
RBC: 4.42
WBC: 3.4 — ABNORMAL LOW
WBC: 4.5
WBC: 5.4

## 2010-10-16 LAB — URINALYSIS, ROUTINE W REFLEX MICROSCOPIC
Glucose, UA: NEGATIVE
Hgb urine dipstick: NEGATIVE
Ketones, ur: NEGATIVE
Protein, ur: NEGATIVE

## 2010-10-16 LAB — TSH: TSH: 2.439

## 2010-10-16 LAB — LIPID PANEL
Cholesterol: 194
HDL: 41
LDL Cholesterol: 140 — ABNORMAL HIGH
Triglycerides: 63

## 2010-10-16 LAB — ETHANOL: Alcohol, Ethyl (B): <5

## 2010-10-16 LAB — RAPID URINE DRUG SCREEN, HOSP PERFORMED
Amphetamines: NOT DETECTED
Benzodiazepines: NOT DETECTED
Tetrahydrocannabinol: POSITIVE — AB

## 2010-10-16 LAB — VITAMIN B12: Vitamin B-12: 455 (ref 211–911)

## 2010-11-05 ENCOUNTER — Inpatient Hospital Stay (HOSPITAL_COMMUNITY)
Admission: EM | Admit: 2010-11-05 | Discharge: 2010-11-08 | DRG: 192 | Disposition: A | Discharge status: Home / Self Care | Payer: Medicare Other | Attending: Internal Medicine | Admitting: Internal Medicine

## 2010-11-05 ENCOUNTER — Encounter (HOSPITAL_COMMUNITY): Payer: Self-pay | Admitting: *Deleted

## 2010-11-05 ENCOUNTER — Emergency Department (HOSPITAL_COMMUNITY): Payer: Medicare Other

## 2010-11-05 ENCOUNTER — Other Ambulatory Visit: Payer: Self-pay

## 2010-11-05 DIAGNOSIS — R972 Elevated prostate specific antigen [PSA]: Secondary | ICD-10-CM

## 2010-11-05 DIAGNOSIS — R7301 Impaired fasting glucose: Secondary | ICD-10-CM

## 2010-11-05 DIAGNOSIS — J45909 Unspecified asthma, uncomplicated: Secondary | ICD-10-CM

## 2010-11-05 DIAGNOSIS — F17200 Nicotine dependence, unspecified, uncomplicated: Secondary | ICD-10-CM | POA: Diagnosis present

## 2010-11-05 DIAGNOSIS — E119 Type 2 diabetes mellitus without complications: Secondary | ICD-10-CM | POA: Diagnosis present

## 2010-11-05 DIAGNOSIS — E559 Vitamin D deficiency, unspecified: Secondary | ICD-10-CM

## 2010-11-05 DIAGNOSIS — R7309 Other abnormal glucose: Secondary | ICD-10-CM

## 2010-11-05 DIAGNOSIS — F1210 Cannabis abuse, uncomplicated: Secondary | ICD-10-CM | POA: Diagnosis present

## 2010-11-05 DIAGNOSIS — E860 Dehydration: Secondary | ICD-10-CM

## 2010-11-05 DIAGNOSIS — E739 Lactose intolerance, unspecified: Secondary | ICD-10-CM | POA: Diagnosis present

## 2010-11-05 DIAGNOSIS — R000 Tachycardia, unspecified: Secondary | ICD-10-CM | POA: Diagnosis present

## 2010-11-05 DIAGNOSIS — J449 Chronic obstructive pulmonary disease, unspecified: Secondary | ICD-10-CM

## 2010-11-05 DIAGNOSIS — R9389 Abnormal findings on diagnostic imaging of other specified body structures: Secondary | ICD-10-CM

## 2010-11-05 DIAGNOSIS — J441 Chronic obstructive pulmonary disease with (acute) exacerbation: Principal | ICD-10-CM | POA: Diagnosis present

## 2010-11-05 DIAGNOSIS — E785 Hyperlipidemia, unspecified: Secondary | ICD-10-CM | POA: Diagnosis present

## 2010-11-05 DIAGNOSIS — I10 Essential (primary) hypertension: Secondary | ICD-10-CM | POA: Diagnosis present

## 2010-11-05 DIAGNOSIS — R112 Nausea with vomiting, unspecified: Secondary | ICD-10-CM

## 2010-11-05 HISTORY — DX: Cannabis abuse, uncomplicated: F12.10

## 2010-11-05 LAB — DIFFERENTIAL
Basophils Absolute: 0 10*3/uL (ref 0.0–0.1)
Eosinophils Relative: 0 % (ref 0–5)
Lymphocytes Relative: 6 % — ABNORMAL LOW (ref 12–46)
Lymphs Abs: 0.4 10*3/uL — ABNORMAL LOW (ref 0.7–4.0)
Monocytes Absolute: 0.1 10*3/uL (ref 0.1–1.0)
Neutro Abs: 6 10*3/uL (ref 1.7–7.7)

## 2010-11-05 LAB — CBC
HCT: 44.9 % (ref 39.0–52.0)
MCV: 92.8 fL (ref 78.0–100.0)
Platelets: 209 10*3/uL (ref 150–400)
RBC: 4.84 MIL/uL (ref 4.22–5.81)
RDW: 13.1 % (ref 11.5–15.5)
WBC: 6.4 10*3/uL (ref 4.0–10.5)

## 2010-11-05 LAB — POCT I-STAT, CHEM 8
BUN: 10 mg/dL (ref 6–23)
Calcium, Ion: 1.17 mmol/L (ref 1.12–1.32)
Chloride: 101 mEq/L (ref 96–112)
Creatinine, Ser: 0.8 mg/dL (ref 0.50–1.35)
Glucose, Bld: 95 mg/dL (ref 70–99)

## 2010-11-05 MED ORDER — PREDNISONE 20 MG PO TABS
60.0000 mg | ORAL_TABLET | Freq: Every day | ORAL | Status: DC
  Administered 2010-11-06: 60 mg via ORAL
  Filled 2010-11-05: qty 3

## 2010-11-05 MED ORDER — IPRATROPIUM BROMIDE 0.02 % IN SOLN
0.5000 mg | Freq: Once | RESPIRATORY_TRACT | Status: AC
  Administered 2010-11-05: 0.5 mg via RESPIRATORY_TRACT
  Filled 2010-11-05: qty 2.5

## 2010-11-05 MED ORDER — SIMVASTATIN 20 MG PO TABS
40.0000 mg | ORAL_TABLET | Freq: Every day | ORAL | Status: DC
  Administered 2010-11-06 – 2010-11-07 (×2): 40 mg via ORAL
  Filled 2010-11-05 (×2): qty 2

## 2010-11-05 MED ORDER — HEPARIN SODIUM (PORCINE) 5000 UNIT/ML IJ SOLN
5000.0000 [IU] | Freq: Three times a day (TID) | INTRAMUSCULAR | Status: DC
  Administered 2010-11-05 – 2010-11-08 (×8): 5000 [IU] via SUBCUTANEOUS
  Filled 2010-11-05 (×8): qty 1

## 2010-11-05 MED ORDER — SENNA 8.6 MG PO TABS: 2.0000 | ORAL_TABLET | Freq: Every day | ORAL | Status: DC | PRN

## 2010-11-05 MED ORDER — IPRATROPIUM BROMIDE 0.02 % IN SOLN
0.5000 mg | RESPIRATORY_TRACT | Status: DC
  Administered 2010-11-05 – 2010-11-07 (×7): 0.5 mg via RESPIRATORY_TRACT
  Filled 2010-11-05 (×7): qty 2.5

## 2010-11-05 MED ORDER — IPRATROPIUM BROMIDE 0.02 % IN SOLN
1.0000 mg | Freq: Once | RESPIRATORY_TRACT | Status: AC
  Administered 2010-11-05: 1 mg via RESPIRATORY_TRACT
  Filled 2010-11-05: qty 5

## 2010-11-05 MED ORDER — TRIAMTERENE-HCTZ 37.5-25 MG PO TABS
1.0000 | ORAL_TABLET | Freq: Every day | ORAL | Status: DC
  Administered 2010-11-06 – 2010-11-08 (×3): 1 via ORAL
  Filled 2010-11-05 (×3): qty 1

## 2010-11-05 MED ORDER — IOHEXOL 300 MG/ML  SOLN
80.0000 mL | Freq: Once | INTRAMUSCULAR | Status: AC | PRN
  Administered 2010-11-05: 80 mL via INTRAVENOUS

## 2010-11-05 MED ORDER — ONDANSETRON HCL 4 MG/2ML IJ SOLN: 4.0000 mg | Freq: Four times a day (QID) | INTRAMUSCULAR | Status: DC | PRN

## 2010-11-05 MED ORDER — ALBUTEROL SULFATE (5 MG/ML) 0.5% IN NEBU: 2.5000 mg | INHALATION_SOLUTION | RESPIRATORY_TRACT | Status: DC

## 2010-11-05 MED ORDER — ACETAMINOPHEN 325 MG PO TABS: 650.0000 mg | ORAL_TABLET | Freq: Four times a day (QID) | ORAL | Status: DC | PRN

## 2010-11-05 MED ORDER — FLUTICASONE-SALMETEROL 500-50 MCG/DOSE IN AEPB
1.0000 | INHALATION_SPRAY | Freq: Two times a day (BID) | RESPIRATORY_TRACT | Status: DC
  Administered 2010-11-06 – 2010-11-08 (×5): 1 via RESPIRATORY_TRACT
  Filled 2010-11-05: qty 14

## 2010-11-05 MED ORDER — ALBUTEROL SULFATE (5 MG/ML) 0.5% IN NEBU: 2.5000 mg | INHALATION_SOLUTION | RESPIRATORY_TRACT | Status: DC | PRN

## 2010-11-05 MED ORDER — ZOLPIDEM TARTRATE 5 MG PO TABS: 5.0000 mg | ORAL_TABLET | Freq: Every evening | ORAL | Status: DC | PRN

## 2010-11-05 MED ORDER — SODIUM CHLORIDE 0.9 % IN NEBU
INHALATION_SOLUTION | RESPIRATORY_TRACT | Status: AC
  Administered 2010-11-05: 12 mL
  Filled 2010-11-05: qty 12

## 2010-11-05 MED ORDER — ONDANSETRON HCL 4 MG PO TABS: 4.0000 mg | ORAL_TABLET | Freq: Four times a day (QID) | ORAL | Status: DC | PRN

## 2010-11-05 MED ORDER — DOCUSATE SODIUM 100 MG PO CAPS: 100.0000 mg | ORAL_CAPSULE | Freq: Two times a day (BID) | ORAL | Status: DC | PRN

## 2010-11-05 MED ORDER — ALBUTEROL SULFATE (5 MG/ML) 0.5% IN NEBU
2.5000 mg | INHALATION_SOLUTION | Freq: Once | RESPIRATORY_TRACT | Status: AC
  Administered 2010-11-05: 2.5 mg via RESPIRATORY_TRACT
  Filled 2010-11-05: qty 0.5

## 2010-11-05 MED ORDER — ALBUTEROL (5 MG/ML) CONTINUOUS INHALATION SOLN
10.0000 mg | INHALATION_SOLUTION | Freq: Once | RESPIRATORY_TRACT | Status: AC
  Administered 2010-11-05: 10 mg via RESPIRATORY_TRACT
  Filled 2010-11-05: qty 20

## 2010-11-05 MED ORDER — SODIUM CHLORIDE 0.9 % IV SOLN
Freq: Once | INTRAVENOUS | Status: AC
  Administered 2010-11-05: via INTRAVENOUS

## 2010-11-05 MED ORDER — ACETAMINOPHEN 650 MG RE SUPP: 650.0000 mg | Freq: Four times a day (QID) | RECTAL | Status: DC | PRN

## 2010-11-05 MED ORDER — PREDNISONE 20 MG PO TABS
60.0000 mg | ORAL_TABLET | Freq: Once | ORAL | Status: AC
  Administered 2010-11-05: 60 mg via ORAL
  Filled 2010-11-05: qty 3

## 2010-11-05 MED ORDER — SODIUM CHLORIDE 0.9 % IV SOLN
INTRAVENOUS | Status: DC
  Administered 2010-11-05 – 2010-11-08 (×6): via INTRAVENOUS

## 2010-11-05 MED ORDER — ALBUTEROL SULFATE (5 MG/ML) 0.5% IN NEBU
2.5000 mg | INHALATION_SOLUTION | RESPIRATORY_TRACT | Status: DC
  Administered 2010-11-05 – 2010-11-07 (×7): 2.5 mg via RESPIRATORY_TRACT
  Filled 2010-11-05 (×7): qty 0.5

## 2010-11-05 MED ORDER — SODIUM CHLORIDE 0.9 % IV SOLN: INTRAVENOUS | Status: AC

## 2010-11-05 NOTE — ED Notes (Signed)
Pt c/o shortness of breath and non productive cough since this am. History of asthma.

## 2010-11-05 NOTE — ED Notes (Signed)
Pt received with a nebulizer treatment in process. Pt's BBS with noted rales and increased work of breathing.  Pt states "been like this all my life"  "have a history of asthma"  Pt reports has a nebulizer at home but treatments didn't help today.

## 2010-11-05 NOTE — ED Notes (Signed)
Patient ambulated through E.R. - O2 sat maintained at 93-96%.

## 2010-11-05 NOTE — ED Provider Notes (Signed)
History     CSN: [id number] Arrival date & time: 11/05/2010  1:04 PM     Chief Complaint  Patient presents with  . Shortness of Breath    HPI Pt was seen at 1335.  Per pt, c/o gradual onset and worsening of persistent SOB, wheezing and cough since this morning.  Pt has been using his home MDI and neb without relief.  Denies CP/palpitations, no fevers, no back pain, no abd pain, no N/V/D, no fevers.     Past Medical History  Diagnosis Date  . Nicotine addiction   . COPD (chronic obstructive pulmonary disease)   . Depression   . Hyperlipidemia   . Hypertension   . CVA (cerebral vascular accident) 2008    with temporary vision loss   . Asthma     History reviewed. No pertinent past surgical history.  Family History  Problem Relation Age of Onset  . Lung disease Sister     History  Substance Use Topics  . Smoking status: Current Everyday Smoker -- 0.3 packs/day    Types: Cigarettes  . Smokeless tobacco: Not on file  . Alcohol Use: Yes     occasionally     Review of Systems ROS: Statement: All systems negative except as marked or noted in the HPI; Constitutional: Negative for fever and chills. ; ; Eyes: Negative for eye pain, redness and discharge. ; ; ENMT: Negative for ear pain, hoarseness, nasal congestion, sinus pressure and sore throat. ; ; Cardiovascular: Negative for chest pain, palpitations, diaphoresis, and peripheral edema. ; ; Respiratory: +cough, wheezing, SOB.  Negative for stridor. ; ; Gastrointestinal: Negative for nausea, vomiting, diarrhea and abdominal pain, blood in stool, hematemesis, jaundice and rectal bleeding. . ; ; Genitourinary: Negative for dysuria, flank pain and hematuria. ; ; Musculoskeletal: Negative for back pain and neck pain. Negative for swelling and trauma.; ; Skin: Negative for pruritus, rash, abrasions, blisters, bruising and skin lesion.; ; Neuro: Negative for headache, lightheadedness and neck stiffness. Negative for weakness, altered  level of consciousness , altered mental status, extremity weakness, paresthesias, involuntary movement, seizure and syncope.     Allergies  Review of patient's allergies indicates no known allergies.  Home Medications   Current Outpatient Rx  Name Route Sig Dispense Refill  . ALBUTEROL SULFATE HFA 108 (90 BASE) MCG/ACT IN AERS Inhalation Inhale 2 puffs into the lungs every 4 (four) hours as needed for wheezing. 1 Inhaler 5  . ALBUTEROL SULFATE (2.5 MG/3ML) 0.083% IN NEBU Nebulization Take 2.5 mg by nebulization as directed. Use one vial every 6 to 8 hours as needed for wheezing 75 mL 5  . FLUTICASONE-SALMETEROL 500-50 MCG/DOSE IN AEPB Inhalation Inhale 1 puff into the lungs 2 (two) times daily. 60 each 11  . IPRATROPIUM BROMIDE 0.02 % IN SOLN Nebulization Take 2.5 mLs (500 mcg total) by nebulization as directed. Use one inhalation every 6 to 8 hours as needed for wheezing, pls mix with albuterol solution before use 75 mL 5  . SIMVASTATIN 40 MG PO TABS Oral Take 1 tablet (40 mg total) by mouth at bedtime. 30 tablet 5  . TRIAMTERENE-HCTZ 37.5-25 MG PO TABS Oral Take 1 tablet by mouth daily.      [name] Kitchen PREDNISONE (PAK) 5 MG PO TABS Oral Take 1 tablet (5 mg total) by mouth as directed. 21 tablet 0    BP 153/85  Pulse 112  Temp(Src) 98.5 F (36.9 C) (Oral)  Resp 28  Ht 5\' 7"  (1.702 m)  Wt 138  lb (62.596 kg)  BMI 21.61 kg/m2  SpO2 97%  Physical Exam 1340: Physical examination:  Nursing notes reviewed; Vital signs and O2 SAT reviewed;  Constitutional: Well developed, Well nourished, Uncomfortable; Head:  Normocephalic, atraumatic; Eyes: EOMI, PERRL, No scleral icterus; ENMT: Mouth and pharynx normal, Mucous membranes dry; Neck: Supple, Full range of motion, No lymphadenopathy; Cardiovascular: Regular rate and rhythm, No murmur, rub, or gallop; Respiratory: Breath sounds diminished with scattered wheezing bilat, No audible wheezing,  +tachypneic.  Speaking in short sentences.  No retrax.; Chest:  Nontender, Movement normal; Abdomen: Soft, Nontender, Nondistended, Normal bowel sounds; Extremities: Pulses normal, No tenderness, No edema, No calf edema or asymmetry.; Neuro: AA&Ox3, Major CN grossly intact.  No gross focal motor or sensory deficits in extremities.; Skin: Color normal, Warm, Dry, no rash.   ED Course  Procedures    MDM  MDM Reviewed: nursing note and vitals Reviewed previous: ECG Interpretation: x-ray, labs, ECG and CT scan    Date: 11/05/2010  Rate: 98  Rhythm: normal sinus rhythm  QRS Axis: normal  Intervals: normal  ST/T Wave abnormalities: normal  Conduction Disutrbances:none  Narrative Interpretation:   Old EKG Reviewed: unchanged; no significant changes from previous EKG dated 07/20/2010.   Dg Chest 2 View  11/05/2010  *RADIOLOGY REPORT*  Clinical Data: Shortness of breath today.  History of asthma. Question infiltrate.  CHEST - 2 VIEW  Comparison: 07/20/2010 and 06/22/2010 radiographs; CT 12/31/2009.  Findings: The heart size and mediastinal contours are stable.  The lungs are hyperinflated.  There is stable biapical subpleural scarring.  On the lateral view, there is question of a developing 1.8 cm cavitary lesion between the spine and hila.  The patient did have irregular ground-glass densities in both lungs in this general area on the prior CT.  There is no pleural effusion or confluent airspace opacity.  IMPRESSION:  1.  Possible developing cavitary lesion in one of the lower lobes, only seen on the lateral view.  Follow-up chest CT is recommended, with contrast if possible. 2.  Otherwise stable chronic lung disease.  No evidence of pneumonia.  Original Report Authenticated By: Gerrianne Scale, M.D.    Ct Chest W Contrast  11/05/2010  *RADIOLOGY REPORT*  Clinical Data: Cough, asthma, abnormal chest x-ray with question cavitary lesion  CT CHEST WITH CONTRAST  Technique:  Multidetector CT imaging of the chest was performed following the standard protocol  during bolus administration of intravenous contrast. Sagittal and coronal MPR images reconstructed from axial data set.  Contrast: 80mL OMNIPAQUE IOHEXOL 300 MG/ML IV SOLN  Comparison: 12/31/2009 Correlation:  Chest radiograph 11/05/2010  Findings: Thoracic vascular structures appear patent on non dedicated exam. Visualized portion of upper abdomen unremarkable. No thoracic adenopathy. Lungs are emphysematous with diffuse peribronchial thickening. Mild cylindrical bronchiectasis in right lower lobe. Calcified granuloma right upper lobe image 23. No additional pulmonary mass, nodule, infiltrate, or pleural effusion. Specifically no evidence of cavitary lung lesion or dominant pulmonary mass. No acute osseous findings.  IMPRESSION: Emphysematous and bronchitic changes with cylindrical bronchiectasis in right lower lobe. No acute pulmonary abnormalities identified.  Original Report Authenticated By: Lollie Marrow, M.D.   Results for orders placed during the hospital encounter of 11/05/10  POCT I-STAT TROPONIN I      Component Value Range   Troponin i, poc 0.00  0.00 - 0.08 (ng/mL)   Comment 3           POCT I-STAT, CHEM 8      Component Value  Range   Sodium 140  135 - 145 (mEq/L)   Potassium 3.7  3.5 - 5.1 (mEq/L)   Chloride 101  96 - 112 (mEq/L)   BUN 10  6 - 23 (mg/dL)   Creatinine, Ser 1.61  0.50 - 1.35 (mg/dL)   Glucose, Bld 95  70 - 99 (mg/dL)   Calcium, Ion 0.96  0.45 - 1.32 (mmol/L)   TCO2 28  0 - 100 (mmol/L)   Hemoglobin 15.6  13.0 - 17.0 (g/dL)   HCT 40.9  81.1 - 91.4 (%)    7:15 PM:  Pt s/p hour long continuous neb and prednisone.  No acute pneumonia on imaging.  Pt's Sats 91-93% R/A sitting on stretcher, drop to 89% R/A while walking with pt c/o increasing SOB and appears uncomfortable.  Lungs continue diminished bilat.  Dx testing d/w pt and family.  Questions answered.  Verb understanding, agreeable to admit.  T/C to [facility] Dr. Kaylyn Layer, case discussed, including:  HPI, pertinent PM/SHx,  VS/PE, dx testing, ED course and treatment.  Agreeable to admit.  Requests to write temporary orders, medical bed.    Umi Mainor Allison Quarry, DO 11/08/10 1934

## 2010-11-05 NOTE — ED Notes (Signed)
Pt walked in hall per orders with pulse ox on and in room air.  Pt had sats of 89 and increased work of breathing.  Pt placed on  at 2 LPM.  Dr Ferman Hamming notified.

## 2010-11-06 LAB — BASIC METABOLIC PANEL
Calcium: 8.9 mg/dL (ref 8.4–10.5)
Creatinine, Ser: 0.79 mg/dL (ref 0.50–1.35)
GFR calc non Af Amer: >90 mL/min (ref >90)
Sodium: 139 mEq/L (ref 135–145)

## 2010-11-06 LAB — CBC
HCT: 39.3 % (ref 39.0–52.0)
HCT: 36.3 % — ABNORMAL LOW (ref 39.0–52.0)
Hemoglobin: 13.2 g/dL (ref 13.0–17.0)
Hemoglobin: 12.1 g/dL — ABNORMAL LOW (ref 13.0–17.0)
MCH: 30.7 pg (ref 26.0–34.0)
MCHC: 33.3 g/dL (ref 30.0–36.0)
MCV: 92.3 fL (ref 78.0–100.0)
MCV: 92.1 fL (ref 78.0–100.0)
Platelets: 178 K/uL (ref 150–400)
RBC: 3.94 MIL/uL — ABNORMAL LOW (ref 4.22–5.81)
RDW: 13 % (ref 11.5–15.5)
WBC: 4.1 10*3/uL (ref 4.0–10.5)
WBC: 4.4 K/uL (ref 4.0–10.5)

## 2010-11-06 LAB — HEMOGLOBIN A1C
Hgb A1c MFr Bld: 6 % — ABNORMAL HIGH
Mean Plasma Glucose: 126 mg/dL — ABNORMAL HIGH

## 2010-11-06 LAB — CREATININE, SERUM
GFR calc Af Amer: >90 mL/min (ref >90)
GFR calc non Af Amer: >90 mL/min (ref >90)

## 2010-11-06 LAB — MRSA PCR SCREENING: MRSA by PCR: NEGATIVE

## 2010-11-06 NOTE — H&P (Signed)
PCP:   Syliva Overman, MD, MD   Chief Complaint:  Difficulty breathing  HPI: [name] is an 60 y.o. male.   With h/o longstanding asthma/COPD, currently still smoking cigarettes and marijuana. Last seen by PCP about a month ago for which he was given inhalers and Prednisone. For past few days having increased SOB and non-productive cough, but no fevers, chills, sweats, no increased productive sputum. No cardiac issues. Was using albuterol inhaler but not helping. Still smoking a few cigs per day and MJ just yesterday.   Comes to ED where he was low 90's, but desat to 80's with ambulation so reqeusting admission. CXR originally concern for cavitary lesion, but CT chest shows it's just bronchiectasis. Given 60 mg Prednisone and 1 hour of nebs.   Past Medical History  Diagnosis Date  . Nicotine addiction   . COPD (chronic obstructive pulmonary disease)   . Depression   . Hyperlipidemia   . Hypertension   . CVA (cerebral vascular accident) 2008    with temporary vision loss   . Asthma   . Marijuana abuse     History reviewed. No pertinent past surgical history.  Medications:  HOME MEDS: Prior to Admission medications   Medication Sig Start Date End Date Taking? Authorizing Provider  albuterol (PROVENTIL HFA;VENTOLIN HFA) 108 (90 BASE) MCG/ACT inhaler Inhale 2 puffs into the lungs every 4 (four) hours as needed for wheezing. 10/11/10 10/11/11 Yes Syliva Overman, MD  albuterol (PROVENTIL) (2.5 MG/3ML) 0.083% nebulizer solution Take 2.5 mg by nebulization as directed. Use one vial every 6 to 8 hours as needed for wheezing 08/27/10  Yes Syliva Overman, MD  Fluticasone-Salmeterol (ADVAIR DISKUS) 500-50 MCG/DOSE AEPB Inhale 1 puff into the lungs 2 (two) times daily. 04/17/10 04/17/11 Yes Syliva Overman, MD  ipratropium (ATROVENT) 0.02 % nebulizer solution Take 2.5 mLs (500 mcg total) by nebulization as directed. Use one inhalation every 6 to 8 hours as needed for wheezing, pls mix  with albuterol solution before use 08/27/10  Yes Syliva Overman, MD  simvastatin (ZOCOR) 40 MG tablet Take 1 tablet (40 mg total) by mouth at bedtime. 10/02/10  Yes Syliva Overman, MD  triamterene-hydrochlorothiazide (MAXZIDE-25) 37.5-25 MG per tablet Take 1 tablet by mouth daily.     Yes Historical Provider, MD  predniSONE (STERAPRED UNI-PAK) 5 MG TABS Take 1 tablet (5 mg total) by mouth as directed. 10/02/10   Syliva Overman, MD    PRIOR TO AMDISSION MEDS Medications Prior to Admission  Medication Dose Route Frequency Provider Last Rate Last Dose  . 0.9 %  sodium chloride infusion   Intravenous Continuous Laray Anger, DO 100 mL/hr at 11/05/10 1533    . 0.9 %  sodium chloride infusion   Intravenous STAT Laray Anger, DO      . 0.9 %  sodium chloride infusion   Intravenous Once Carlota Raspberry, MD 100 mL/hr at 11/05/10 2344    . acetaminophen (TYLENOL) tablet 650 mg  650 mg Oral Q6H PRN Carlota Raspberry, MD       Or  . acetaminophen (TYLENOL) suppository 650 mg  650 mg Rectal Q6H PRN Carlota Raspberry, MD      . albuterol (PROVENTIL) (5 MG/ML) 0.5% nebulizer solution 2.5 mg  2.5 mg Nebulization Once Carlota Raspberry, MD   2.5 mg at 11/05/10 1957  . albuterol (PROVENTIL) (5 MG/ML) 0.5% nebulizer solution 2.5 mg  2.5 mg Nebulization Q4H Carlota Raspberry, MD   2.5 mg at 11/05/10 2356  . albuterol (PROVENTIL) (  5 MG/ML) 0.5% nebulizer solution 2.5 mg  2.5 mg Nebulization Q2H PRN Carlota Raspberry, MD      . albuterol (PROVENTIL,VENTOLIN) solution continuous neb  10 mg Nebulization Once Laray Anger, DO   10 mg at 11/05/10 1436  . docusate sodium (COLACE) capsule 100 mg  100 mg Oral BID PRN Carlota Raspberry, MD      . Fluticasone-Salmeterol (ADVAIR) 500-50 MCG/DOSE inhaler 1 puff  1 puff Inhalation BID Carlota Raspberry, MD      . heparin injection 5,000 Units  5,000 Units Subcutaneous Q8H Carlota Raspberry, MD   5,000 Units at 11/05/10 2347  . iohexol (OMNIPAQUE) 300 MG/ML injection 80 mL  80 mL Intravenous Once PRN Medication  Radiologist   80 mL at 11/05/10 1624  . ipratropium (ATROVENT) nebulizer solution 0.5 mg  0.5 mg Nebulization Once Carlota Raspberry, MD   0.5 mg at 11/05/10 1957  . ipratropium (ATROVENT) nebulizer solution 0.5 mg  0.5 mg Nebulization Q4H Carlota Raspberry, MD   0.5 mg at 11/05/10 2356  . ipratropium (ATROVENT) nebulizer solution 1 mg  1 mg Nebulization Once Laray Anger, DO   1 mg at 11/05/10 1436  . ondansetron (ZOFRAN) tablet 4 mg  4 mg Oral Q6H PRN Carlota Raspberry, MD       Or  . ondansetron [facility]) injection 4 mg  4 mg Intravenous Q6H PRN Carlota Raspberry, MD      . predniSONE (DELTASONE) tablet 60 mg  60 mg Oral Once Laray Anger, DO   60 mg at 11/05/10 1409  . predniSONE (DELTASONE) tablet 60 mg  60 mg Oral QAC breakfast Carlota Raspberry, MD      . senna [facility]) tablet 17.2 mg  2 tablet Oral Daily PRN Carlota Raspberry, MD      . simvastatin (ZOCOR) tablet 40 mg  40 mg Oral QHS Carlota Raspberry, MD      . sodium chloride 0.9 % nebulizer solution        12 mL at 11/05/10 1437  . triamterene-hydrochlorothiazide (MAXZIDE-25) 37.5-25 MG per tablet 1 each  1 each Oral Daily Carlota Raspberry, MD      . zolpidem (AMBIEN) tablet 5 mg  5 mg Oral QHS PRN Carlota Raspberry, MD      . DISCONTD: albuterol (PROVENTIL) (5 MG/ML) 0.5% nebulizer solution 2.5 mg  2.5 mg Nebulization Q4H Laray Anger, DO       Medications Prior to Admission  Medication Sig Dispense Refill  . albuterol (PROVENTIL HFA;VENTOLIN HFA) 108 (90 BASE) MCG/ACT inhaler Inhale 2 puffs into the lungs every 4 (four) hours as needed for wheezing.  1 Inhaler  5  . albuterol (PROVENTIL) (2.5 MG/3ML) 0.083% nebulizer solution Take 2.5 mg by nebulization as directed. Use one vial every 6 to 8 hours as needed for wheezing  75 mL  5  . Fluticasone-Salmeterol (ADVAIR DISKUS) 500-50 MCG/DOSE AEPB Inhale 1 puff into the lungs 2 (two) times daily.  60 each  11  . ipratropium (ATROVENT) 0.02 % nebulizer solution Take 2.5 mLs (500 mcg total) by nebulization as directed. Use one  inhalation every 6 to 8 hours as needed for wheezing, pls mix with albuterol solution before use  75 mL  5  . simvastatin (ZOCOR) 40 MG tablet Take 1 tablet (40 mg total) by mouth at bedtime.  30 tablet  5  . triamterene-hydrochlorothiazide (MAXZIDE-25) 37.5-25 MG per tablet Take 1 tablet by mouth daily.        . predniSONE (STERAPRED UNI-PAK)  5 MG TABS Take 1 tablet (5 mg total) by mouth as directed.  21 tablet  0    Allergies:  No Known Allergies  Social History:  Smokes a few cigs per day, and MJ as recently as yesterday    reports that he has been smoking Cigarettes.  He has been smoking about .3 packs per day. He does not have any smokeless tobacco history on file. He reports that he drinks alcohol. He reports that he uses illicit drugs (Marijuana).  Family History: Family History  Problem Relation Age of Onset  . Lung disease Sister     Rewiew of Systems:  Per ROS, also denies systemic illness. Has cough but non productive and no increased sputum production. No GI symptoms. No fevers, chills, sweats. O/w extensively negative.   Physical Exam: Filed Vitals:   11/05/10 1824 11/05/10 1959 11/05/10 2332 11/05/10 2357  BP: 122/74  139/80   Pulse: 86  70   Temp:   98.3 F (36.8 C)   TempSrc:   Oral   Resp: 22  16   Height:   5\' 7"  (1.702 m)   Weight:   56 kg (123 lb 7.3 oz)   SpO2: 95% 94% 100% 97%   Blood pressure 139/80, pulse 70, temperature 98.3 F (36.8 C), temperature source Oral, resp. rate 16, height 5\' 7"  (1.702 m), weight 56 kg (123 lb 7.3 oz), SpO2 97.00%.  GEN: Pleasant, using minimal amount of inspiratory muscles to breath but able to speak in full sentences.  PSYCH: He is alert and oriented x4; does not appear anxious does not appear depressed; affect is normal HEENT: Mucous membranes pink and anicteric; PERRLA; EOM intact; no cervical lymphadenopathy nor thyromegaly or carotid bruit; no JVD; Breasts:: Not examined CHEST WALL: No tenderness CHEST: Fair air  movement, has inspiratory rales and expiratory wheezes scattered through lung fields. Seems tight.  HEART: Regular rate and rhythm; no murmurs rubs or gallops BACK: No kyphosis or scoliosis; no CVA tenderness ABDOMEN: Obese, soft non-tender; no masses, no organomegaly, normal abdominal bowel sounds; no pannus; no intertriginous candida. Rectal Exam: Not done EXTREMITIES: No bone or joint deformity; age-appropriate arthropathy of the hands and knees; no edema; no ulcerations. Genitalia: not examined PULSES: 2+ and symmetric SKIN: Normal hydration no rash or ulceration CNS: Cranial nerves 2-12 grossly intact no focal neurologic deficit   Labs & Imaging Results for orders placed during the hospital encounter of 11/05/10 (from the past 48 hour(s))  POCT I-STAT TROPONIN I     Status: Normal   Collection Time   11/05/10  3:31 PM      Component Value Range Comment   Troponin i, poc 0.00  0.00 - 0.08 (ng/mL)    Comment 3            POCT I-STAT, CHEM 8     Status: Normal   Collection Time   11/05/10  3:34 PM      Component Value Range Comment   Sodium 140  135 - 145 (mEq/L)    Potassium 3.7  3.5 - 5.1 (mEq/L)    Chloride 101  96 - 112 (mEq/L)    BUN 10  6 - 23 (mg/dL)    Creatinine, Ser 4.78  0.50 - 1.35 (mg/dL)    Glucose, Bld 95  70 - 99 (mg/dL)    Calcium, Ion 2.95  1.12 - 1.32 (mmol/L)    TCO2 28  0 - 100 (mmol/L)    Hemoglobin 15.6  13.0 -  17.0 (g/dL)    HCT 81.1  91.4 - 78.2 (%)   CBC     Status: Normal   Collection Time   11/05/10  7:59 PM      Component Value Range Comment   WBC 6.4  4.0 - 10.5 (K/uL)    RBC 4.84  4.22 - 5.81 (MIL/uL)    Hemoglobin 14.7  13.0 - 17.0 (g/dL)    HCT 95.6  21.3 - 08.6 (%)    MCV 92.8  78.0 - 100.0 (fL)    MCH 30.4  26.0 - 34.0 (pg)    MCHC 32.7  30.0 - 36.0 (g/dL)    RDW 57.8  46.9 - 62.9 (%)    Platelets 209  150 - 400 (K/uL)   DIFFERENTIAL     Status: Abnormal   Collection Time   11/05/10  7:59 PM      Component Value Range Comment    Neutrophils Relative 93 (*) 43 - 77 (%)    Neutro Abs 6.0  1.7 - 7.7 (K/uL)    Lymphocytes Relative 6 (*) 12 - 46 (%)    Lymphs Abs 0.4 (*) 0.7 - 4.0 (K/uL)    Monocytes Relative 1 (*) 3 - 12 (%)    Monocytes Absolute 0.1  0.1 - 1.0 (K/uL)    Eosinophils Relative 0  0 - 5 (%)    Eosinophils Absolute 0.0  0.0 - 0.7 (K/uL)    Basophils Relative 0  0 - 1 (%)    Basophils Absolute 0.0  0.0 - 0.1 (K/uL)    Dg Chest 2 View  11/05/2010  *RADIOLOGY REPORT*  Clinical Data: Shortness of breath today.  History of asthma. Question infiltrate.  CHEST - 2 VIEW  Comparison: 07/20/2010 and 06/22/2010 radiographs; CT 12/31/2009.  Findings: The heart size and mediastinal contours are stable.  The lungs are hyperinflated.  There is stable biapical subpleural scarring.  On the lateral view, there is question of a developing 1.8 cm cavitary lesion between the spine and hila.  The patient did have irregular ground-glass densities in both lungs in this general area on the prior CT.  There is no pleural effusion or confluent airspace opacity.  IMPRESSION:  1.  Possible developing cavitary lesion in one of the lower lobes, only seen on the lateral view.  Follow-up chest CT is recommended, with contrast if possible. 2.  Otherwise stable chronic lung disease.  No evidence of pneumonia.  Original Report Authenticated By: Gerrianne Scale, M.D.   Ct Chest W Contrast  11/05/2010  *RADIOLOGY REPORT*  Clinical Data: Cough, asthma, abnormal chest x-ray with question cavitary lesion  CT CHEST WITH CONTRAST  Technique:  Multidetector CT imaging of the chest was performed following the standard protocol during bolus administration of intravenous contrast. Sagittal and coronal MPR images reconstructed from axial data set.  Contrast: 80mL OMNIPAQUE IOHEXOL 300 MG/ML IV SOLN  Comparison: 12/31/2009 Correlation:  Chest radiograph 11/05/2010  Findings: Thoracic vascular structures appear patent on non dedicated exam. Visualized portion of  upper abdomen unremarkable. No thoracic adenopathy. Lungs are emphysematous with diffuse peribronchial thickening. Mild cylindrical bronchiectasis in right lower lobe. Calcified granuloma right upper lobe image 23. No additional pulmonary mass, nodule, infiltrate, or pleural effusion. Specifically no evidence of cavitary lung lesion or dominant pulmonary mass. No acute osseous findings.  IMPRESSION: Emphysematous and bronchitic changes with cylindrical bronchiectasis in right lower lobe. No acute pulmonary abnormalities identified.  Original Report Authenticated By: Lollie Marrow, M.D.  Assessment Present on Admission:  .HYPERTENSION .CHRONIC OBSTRUCTIVE PULMONARY DISEASE, ACUTE EXACERBATION .HYPERLIPIDEMIA .PREDIABETES .Tachycardia .NICOTINE ADDICTION .Marijuana abuse   PLAN: - Continue home HTN and statin meds. BP well controlled at present - Aggressive nebs and will continue PO Prednisone for presumed COPD exacerbation. He isn't making increased sputum, no reported fevers, and WBC low so will NOT treat with ABx.  - Counseled re: smoking cigs and MJ abuse, needs to stop  - Was initiallly tachy on my exam but likely due to dyspnea and albuterol, would just monitor for now   Other plans as per orders.  Critical care time: 60 minutes.   Elena Cothern 11/06/2010, 12:24 AM

## 2010-11-06 NOTE — Progress Notes (Signed)
UR Chart Review Completed  

## 2010-11-06 NOTE — Progress Notes (Signed)
Subjective: Patient seen and examined. Was admitted him this morning for COPD exacerbation. Feeling better. Not in acute distress.c/o occasional dry cough ,no fever or chills.  Objective: Vital signs in last 24 hours: Temp:  [98.2 F (36.8 C)-98.5 F (36.9 C)] 98.2 F (36.8 C) (10/30 0400) Pulse Rate:  [70-112] 106  (10/30 0500) Resp:  [16-28] 16  (10/29 2332) BP: (109-153)/(62-91) 109/91 mmHg (10/30 0500) SpO2:  [94 %-100 %] 99 % (10/30 0838) FiO2 (%):  [100 %] 100 % (10/29 2040) Weight:  [56 kg (123 lb 7.3 oz)-62.596 kg (138 lb)] 124 lb 12.5 oz (56.6 kg) (10/30 0500) Weight change:  Last BM Date: 11/04/10  Intake/Output from previous day: 10/29 0701 - 10/30 0700 In: 1685 [P.O.:240; I.V.:1445] Out: 500 [Urine:500]     Physical Exam: General: Alert, awake, oriented x3, in no acute distress. HEENT: No bruits, no goiter. Heart: Regular rate and rhythm, without murmurs, rubs, gallops. Lungs: Expiratory wheezing noted. No rales. Abdomen: Soft, nontender, nondistended, positive bowel sounds. Extremities: No clubbing cyanosis or edema with positive pedal pulses.     Lab Results: Results for orders placed during the hospital encounter of 11/05/10 (from the past 24 hour(s))  POCT I-STAT TROPONIN I     Status: Normal   Collection Time   11/05/10  3:31 PM      Component Value Range   Troponin i, poc 0.00  0.00 - 0.08 (ng/mL)   Comment 3           POCT I-STAT, CHEM 8     Status: Normal   Collection Time   11/05/10  3:34 PM      Component Value Range   Sodium 140  135 - 145 (mEq/L)   Potassium 3.7  3.5 - 5.1 (mEq/L)   Chloride 101  96 - 112 (mEq/L)   BUN 10  6 - 23 (mg/dL)   Creatinine, Ser 1.61  0.50 - 1.35 (mg/dL)   Glucose, Bld 95  70 - 99 (mg/dL)   Calcium, Ion 0.96  0.45 - 1.32 (mmol/L)   TCO2 28  0 - 100 (mmol/L)   Hemoglobin 15.6  13.0 - 17.0 (g/dL)   HCT 40.9  81.1 - 91.4 (%)  CBC     Status: Normal   Collection Time   11/05/10  7:59 PM      Component Value  Range   WBC 6.4  4.0 - 10.5 (K/uL)   RBC 4.84  4.22 - 5.81 (MIL/uL)   Hemoglobin 14.7  13.0 - 17.0 (g/dL)   HCT 78.2  95.6 - 21.3 (%)   MCV 92.8  78.0 - 100.0 (fL)   MCH 30.4  26.0 - 34.0 (pg)   MCHC 32.7  30.0 - 36.0 (g/dL)   RDW 08.6  57.8 - 46.9 (%)   Platelets 209  150 - 400 (K/uL)  DIFFERENTIAL     Status: Abnormal   Collection Time   11/05/10  7:59 PM      Component Value Range   Neutrophils Relative 93 (*) 43 - 77 (%)   Neutro Abs 6.0  1.7 - 7.7 (K/uL)   Lymphocytes Relative 6 (*) 12 - 46 (%)   Lymphs Abs 0.4 (*) 0.7 - 4.0 (K/uL)   Monocytes Relative 1 (*) 3 - 12 (%)   Monocytes Absolute 0.1  0.1 - 1.0 (K/uL)   Eosinophils Relative 0  0 - 5 (%)   Eosinophils Absolute 0.0  0.0 - 0.7 (K/uL)   Basophils Relative 0  0 -  1 (%)   Basophils Absolute 0.0  0.0 - 0.1 (K/uL)  MRSA PCR SCREENING     Status: Normal   Collection Time   11/06/10 12:09 AM      Component Value Range   MRSA by PCR NEGATIVE  NEGATIVE   CBC     Status: Normal   Collection Time   11/06/10 12:10 AM      Component Value Range   WBC 4.1  4.0 - 10.5 (K/uL)   RBC 4.26  4.22 - 5.81 (MIL/uL)   Hemoglobin 13.2  13.0 - 17.0 (g/dL)   HCT 45.4  09.8 - 11.9 (%)   MCV 92.3  78.0 - 100.0 (fL)   MCH 31.0  26.0 - 34.0 (pg)   MCHC 33.6  30.0 - 36.0 (g/dL)   RDW 14.7  82.9 - 56.2 (%)   Platelets 192  150 - 400 (K/uL)  CREATININE, SERUM     Status: Normal   Collection Time   11/06/10 12:10 AM      Component Value Range   Creatinine, Ser 0.86  0.50 - 1.35 (mg/dL)   GFR calc non Af Amer >90  >90 (mL/min)   GFR calc Af Amer >90  >90 (mL/min)  BASIC METABOLIC PANEL     Status: Abnormal   Collection Time   11/06/10  4:48 AM      Component Value Range   Sodium 139  135 - 145 (mEq/L)   Potassium 3.7  3.5 - 5.1 (mEq/L)   Chloride 103  96 - 112 (mEq/L)   CO2 29  19 - 32 (mEq/L)   Glucose, Bld 112 (*) 70 - 99 (mg/dL)   BUN 13  6 - 23 (mg/dL)   Creatinine, Ser 1.30  0.50 - 1.35 (mg/dL)   Calcium 8.9  8.4 - 86.5  (mg/dL)   GFR calc non Af Amer >90  >90 (mL/min)   GFR calc Af Amer >90  >90 (mL/min)  CBC     Status: Abnormal   Collection Time   11/06/10  4:48 AM      Component Value Range   WBC 4.4  4.0 - 10.5 (K/uL)   RBC 3.94 (*) 4.22 - 5.81 (MIL/uL)   Hemoglobin 12.1 (*) 13.0 - 17.0 (g/dL)   HCT 78.4 (*) 69.6 - 52.0 (%)   MCV 92.1  78.0 - 100.0 (fL)   MCH 30.7  26.0 - 34.0 (pg)   MCHC 33.3  30.0 - 36.0 (g/dL)   RDW 29.5  28.4 - 13.2 (%)   Platelets 178  150 - 400 (K/uL)    Recent Results (from the past 240 hour(s))  MRSA PCR SCREENING     Status: Normal   Collection Time   11/06/10 12:09 AM      Component Value Range Status Comment   MRSA by PCR NEGATIVE  NEGATIVE  Final     Studies/Results: Dg Chest 2 View  11/05/2010  *RADIOLOGY REPORT*  Clinical Data: Shortness of breath today.  History of asthma. Question infiltrate.  CHEST - 2 VIEW  Comparison: 07/20/2010 and 06/22/2010 radiographs; CT 12/31/2009.  Findings: The heart size and mediastinal contours are stable.  The lungs are hyperinflated.  There is stable biapical subpleural scarring.  On the lateral view, there is question of a developing 1.8 cm cavitary lesion between the spine and hila.  The patient did have irregular ground-glass densities in both lungs in this general area on the prior CT.  There is no pleural effusion or confluent  airspace opacity.  IMPRESSION:  1.  Possible developing cavitary lesion in one of the lower lobes, only seen on the lateral view.  Follow-up chest CT is recommended, with contrast if possible. 2.  Otherwise stable chronic lung disease.  No evidence of pneumonia.  Original Report Authenticated By: Gerrianne Scale, M.D.   Ct Chest W Contrast  11/05/2010  *RADIOLOGY REPORT*  Clinical Data: Cough, asthma, abnormal chest x-ray with question cavitary lesion  CT CHEST WITH CONTRAST  Technique:  Multidetector CT imaging of the chest was performed following the standard protocol during bolus administration of  intravenous contrast. Sagittal and coronal MPR images reconstructed from axial data set.  Contrast: 80mL OMNIPAQUE IOHEXOL 300 MG/ML IV SOLN  Comparison: 12/31/2009 Correlation:  Chest radiograph 11/05/2010  Findings: Thoracic vascular structures appear patent on non dedicated exam. Visualized portion of upper abdomen unremarkable. No thoracic adenopathy. Lungs are emphysematous with diffuse peribronchial thickening. Mild cylindrical bronchiectasis in right lower lobe. Calcified granuloma right upper lobe image 23. No additional pulmonary mass, nodule, infiltrate, or pleural effusion. Specifically no evidence of cavitary lung lesion or dominant pulmonary mass. No acute osseous findings.  IMPRESSION: Emphysematous and bronchitic changes with cylindrical bronchiectasis in right lower lobe. No acute pulmonary abnormalities identified.  Original Report Authenticated By: Lollie Marrow, M.D.    Medications:    . sodium chloride   Intravenous STAT  . sodium chloride   Intravenous Once  . albuterol  2.5 mg Nebulization Once  . albuterol  2.5 mg Nebulization Q4H  . albuterol  10 mg Nebulization Once  . Fluticasone-Salmeterol  1 puff Inhalation BID  . heparin  5,000 Units Subcutaneous Q8H  . ipratropium  0.5 mg Nebulization Once  . ipratropium  0.5 mg Nebulization Q4H  . ipratropium  1 mg Nebulization Once  . predniSONE  60 mg Oral Once  . predniSONE  60 mg Oral QAC breakfast  . simvastatin  40 mg Oral QHS  . sodium chloride      . triamterene-hydrochlorothiazide  1 each Oral Daily  . DISCONTD: albuterol  2.5 mg Nebulization Q4H    acetaminophen, acetaminophen, albuterol, docusate sodium, iohexol, ondansetron (ZOFRAN) IV, ondansetron, senna, zolpidem     . sodium chloride 100 mL/hr at 11/05/10 1533    Assessment/Plan:  Principal Problem:  *CHRONIC OBSTRUCTIVE PULMONARY DISEASE, ACUTE EXACERBATION *Bronchectasis Active Problems:  HYPERTENSION,controlled   NICOTINE ADDICTION  Marijuana  abuse  PREDIABETES HYPERLIPIDEMIA  Plan Continue current management was nebulizer treatments and steroids. Counseling on the smoking  and marijuana abuse. We need to be evaluated for the need for oxygen home oxygen on discharge. Check hemoglobin A1c. Monitor CBGs. Patient can be transferred to the ICU to the medical floor.    LOS: 1 day   [name] 11/06/2010, 8:58 AM

## 2010-11-07 MED ORDER — METHYLPREDNISOLONE SODIUM SUCC 125 MG IJ SOLR
125.0000 mg | Freq: Four times a day (QID) | INTRAMUSCULAR | Status: DC
  Administered 2010-11-07 – 2010-11-08 (×5): 125 mg via INTRAVENOUS
  Filled 2010-11-07 (×5): qty 2

## 2010-11-07 MED ORDER — ALBUTEROL SULFATE (5 MG/ML) 0.5% IN NEBU
2.5000 mg | INHALATION_SOLUTION | Freq: Four times a day (QID) | RESPIRATORY_TRACT | Status: DC
  Administered 2010-11-07 – 2010-11-08 (×6): 2.5 mg via RESPIRATORY_TRACT
  Filled 2010-11-07 (×6): qty 0.5

## 2010-11-07 MED ORDER — IPRATROPIUM BROMIDE 0.02 % IN SOLN
0.5000 mg | Freq: Four times a day (QID) | RESPIRATORY_TRACT | Status: DC
  Administered 2010-11-07 – 2010-11-08 (×6): 0.5 mg via RESPIRATORY_TRACT
  Filled 2010-11-07 (×6): qty 2.5

## 2010-11-07 NOTE — Progress Notes (Signed)
Subjective: This man, who is well-known to our practice, was admitted again with exacerbation of COPD. He denies running out of his Advair. Unfortunately, he continues to smoke cigarettes. Also there is documentation of marijuana use. Today, he says he feels tight in his chest. He is on oral steroids.           Physical Exam: Blood pressure 115/54, pulse 88, temperature 98.2 F (36.8 C), temperature source Oral, resp. rate 20, height 5\' 7"  (1.702 m), weight 56.6 kg (124 lb 12.5 oz), SpO2 95.00%. He has increased work of breathing. Is not toxic or septic. Lung fields show wheezing and his chest is tight. Heart sounds are present and normal without murmurs. He is in sinus rhythm. He is alert and orientated without any focal neurological signs.   Investigations: Results for orders placed during the hospital encounter of 11/05/10 (from the past 48 hour(s))  POCT I-STAT TROPONIN I     Status: Normal   Collection Time   11/05/10  3:31 PM      Component Value Range Comment   Troponin i, poc 0.00  0.00 - 0.08 (ng/mL)    Comment 3            POCT I-STAT, CHEM 8     Status: Normal   Collection Time   11/05/10  3:34 PM      Component Value Range Comment   Sodium 140  135 - 145 (mEq/L)    Potassium 3.7  3.5 - 5.1 (mEq/L)    Chloride 101  96 - 112 (mEq/L)    BUN 10  6 - 23 (mg/dL)    Creatinine, Ser 1.61  0.50 - 1.35 (mg/dL)    Glucose, Bld 95  70 - 99 (mg/dL)    Calcium, Ion 0.96  1.12 - 1.32 (mmol/L)    TCO2 28  0 - 100 (mmol/L)    Hemoglobin 15.6  13.0 - 17.0 (g/dL)    HCT 04.5  40.9 - 81.1 (%)   CBC     Status: Normal   Collection Time   11/05/10  7:59 PM      Component Value Range Comment   WBC 6.4  4.0 - 10.5 (K/uL)    RBC 4.84  4.22 - 5.81 (MIL/uL)    Hemoglobin 14.7  13.0 - 17.0 (g/dL)    HCT 91.4  78.2 - 95.6 (%)    MCV 92.8  78.0 - 100.0 (fL)    MCH 30.4  26.0 - 34.0 (pg)    MCHC 32.7  30.0 - 36.0 (g/dL)    RDW 21.3  08.6 - 57.8 (%)    Platelets 209  150 - 400 (K/uL)    DIFFERENTIAL     Status: Abnormal   Collection Time   11/05/10  7:59 PM      Component Value Range Comment   Neutrophils Relative 93 (*) 43 - 77 (%)    Neutro Abs 6.0  1.7 - 7.7 (K/uL)    Lymphocytes Relative 6 (*) 12 - 46 (%)    Lymphs Abs 0.4 (*) 0.7 - 4.0 (K/uL)    Monocytes Relative 1 (*) 3 - 12 (%)    Monocytes Absolute 0.1  0.1 - 1.0 (K/uL)    Eosinophils Relative 0  0 - 5 (%)    Eosinophils Absolute 0.0  0.0 - 0.7 (K/uL)    Basophils Relative 0  0 - 1 (%)    Basophils Absolute 0.0  0.0 - 0.1 (K/uL)   MRSA PCR  SCREENING     Status: Normal   Collection Time   11/06/10 12:09 AM      Component Value Range Comment   MRSA by PCR NEGATIVE  NEGATIVE    CBC     Status: Normal   Collection Time   11/06/10 12:10 AM      Component Value Range Comment   WBC 4.1  4.0 - 10.5 (K/uL)    RBC 4.26  4.22 - 5.81 (MIL/uL)    Hemoglobin 13.2  13.0 - 17.0 (g/dL)    HCT 47.8  29.5 - 62.1 (%)    MCV 92.3  78.0 - 100.0 (fL)    MCH 31.0  26.0 - 34.0 (pg)    MCHC 33.6  30.0 - 36.0 (g/dL)    RDW 30.8  65.7 - 84.6 (%)    Platelets 192  150 - 400 (K/uL)   CREATININE, SERUM     Status: Normal   Collection Time   11/06/10 12:10 AM      Component Value Range Comment   Creatinine, Ser 0.86  0.50 - 1.35 (mg/dL)    GFR calc non Af Amer >90  >90 (mL/min)    GFR calc Af Amer >90  >90 (mL/min)   BASIC METABOLIC PANEL     Status: Abnormal   Collection Time   11/06/10  4:48 AM      Component Value Range Comment   Sodium 139  135 - 145 (mEq/L)    Potassium 3.7  3.5 - 5.1 (mEq/L)    Chloride 103  96 - 112 (mEq/L)    CO2 29  19 - 32 (mEq/L)    Glucose, Bld 112 (*) 70 - 99 (mg/dL)    BUN 13  6 - 23 (mg/dL)    Creatinine, Ser 9.62  0.50 - 1.35 (mg/dL)    Calcium 8.9  8.4 - 10.5 (mg/dL)    GFR calc non Af Amer >90  >90 (mL/min)    GFR calc Af Amer >90  >90 (mL/min)   CBC     Status: Abnormal   Collection Time   11/06/10  4:48 AM      Component Value Range Comment   WBC 4.4  4.0 - 10.5 (K/uL)    RBC  3.94 (*) 4.22 - 5.81 (MIL/uL)    Hemoglobin 12.1 (*) 13.0 - 17.0 (g/dL)    HCT 95.2 (*) 84.1 - 52.0 (%)    MCV 92.1  78.0 - 100.0 (fL)    MCH 30.7  26.0 - 34.0 (pg)    MCHC 33.3  30.0 - 36.0 (g/dL)    RDW 32.4  40.1 - 02.7 (%)    Platelets 178  150 - 400 (K/uL)   HEMOGLOBIN A1C     Status: Abnormal   Collection Time   11/06/10  9:09 AM      Component Value Range Comment   Hemoglobin A1C 6.0 (*) <5.7 (%)    Mean Plasma Glucose 126 (*) <117 (mg/dL)    Recent Results (from the past 240 hour(s))  MRSA PCR SCREENING     Status: Normal   Collection Time   11/06/10 12:09 AM      Component Value Range Status Comment   MRSA by PCR NEGATIVE  NEGATIVE  Final     Dg Chest 2 View  11/05/2010  *RADIOLOGY REPORT*  Clinical Data: Shortness of breath today.  History of asthma. Question infiltrate.  CHEST - 2 VIEW  Comparison: 07/20/2010 and 06/22/2010 radiographs; CT 12/31/2009.  Findings:  The heart size and mediastinal contours are stable.  The lungs are hyperinflated.  There is stable biapical subpleural scarring.  On the lateral view, there is question of a developing 1.8 cm cavitary lesion between the spine and hila.  The patient did have irregular ground-glass densities in both lungs in this general area on the prior CT.  There is no pleural effusion or confluent airspace opacity.  IMPRESSION:  1.  Possible developing cavitary lesion in one of the lower lobes, only seen on the lateral view.  Follow-up chest CT is recommended, with contrast if possible. 2.  Otherwise stable chronic lung disease.  No evidence of pneumonia.  Original Report Authenticated By: Gerrianne Scale, M.D.   Ct Chest W Contrast  11/05/2010  *RADIOLOGY REPORT*  Clinical Data: Cough, asthma, abnormal chest x-ray with question cavitary lesion  CT CHEST WITH CONTRAST  Technique:  Multidetector CT imaging of the chest was performed following the standard protocol during bolus administration of intravenous contrast. Sagittal and  coronal MPR images reconstructed from axial data set.  Contrast: 80mL OMNIPAQUE IOHEXOL 300 MG/ML IV SOLN  Comparison: 12/31/2009 Correlation:  Chest radiograph 11/05/2010  Findings: Thoracic vascular structures appear patent on non dedicated exam. Visualized portion of upper abdomen unremarkable. No thoracic adenopathy. Lungs are emphysematous with diffuse peribronchial thickening. Mild cylindrical bronchiectasis in right lower lobe. Calcified granuloma right upper lobe image 23. No additional pulmonary mass, nodule, infiltrate, or pleural effusion. Specifically no evidence of cavitary lung lesion or dominant pulmonary mass. No acute osseous findings.  IMPRESSION: Emphysematous and bronchitic changes with cylindrical bronchiectasis in right lower lobe. No acute pulmonary abnormalities identified.  Original Report Authenticated By: Lollie Marrow, M.D.      Medications: I have reviewed the patient's current medications.  Impression: 1. Exacerbation of COPD. 2. Hypertension. 3. Tobacco abuse, ongoing. 4. Marijuana use.     Plan: 1. Start intravenous steroids. 2. Continue current treatment otherwise.     LOS: 2 days   Tye Vigo C 11/07/2010, 8:02 AM

## 2010-11-08 LAB — COMPREHENSIVE METABOLIC PANEL
AST: 16 U/L (ref 0–37)
Albumin: 3.4 g/dL — ABNORMAL LOW (ref 3.5–5.2)
BUN: 8 mg/dL (ref 6–23)
Chloride: 103 mEq/L (ref 96–112)
Creatinine, Ser: 0.75 mg/dL (ref 0.50–1.35)
Total Bilirubin: 0.2 mg/dL — ABNORMAL LOW (ref 0.3–1.2)
Total Protein: 5.7 g/dL — ABNORMAL LOW (ref 6.0–8.3)

## 2010-11-08 LAB — CBC
MCHC: 33.3 g/dL (ref 30.0–36.0)
MCV: 93.1 fL (ref 78.0–100.0)
Platelets: 177 10*3/uL (ref 150–400)
RDW: 13.2 % (ref 11.5–15.5)
WBC: 6.3 10*3/uL (ref 4.0–10.5)

## 2010-11-08 MED ORDER — PREDNISONE 20 MG PO TABS: ORAL_TABLET | ORAL | Status: DC

## 2010-11-08 MED ORDER — PREDNISONE 20 MG PO TABS: 40.0000 mg | ORAL_TABLET | Freq: Every day | ORAL | Status: DC

## 2010-11-08 NOTE — Progress Notes (Signed)
Subjective: This man, who is well-known to our practice, was admitted again with exacerbation of COPD. He denies running out of his Advair. Unfortunately, he continues to smoke cigarettes. Also there is documentation of marijuana use. Today he feels much improved having been on higher dose intravenous steroids yesterday. He is able to mobilize without getting dyspneic. He feels he is back to his baseline.           Physical Exam: Blood pressure 142/76, pulse 65, temperature 98.2 F (36.8 C), temperature source Oral, resp. rate 20, height 5\' 7"  (1.702 m), weight 59.1 kg (130 lb 4.7 oz), SpO2 100.00%. Indeed, he is improved with no increased work of breathing. Lung fields are now clear without any wheezing. He is alert and orientated. Heart sounds are present and normal without murmurs.   Investigations: Results for orders placed during the hospital encounter of 11/05/10 (from the past 48 hour(s))  HEMOGLOBIN A1C     Status: Abnormal   Collection Time   11/06/10  9:09 AM      Component Value Range Comment   Hemoglobin A1C 6.0 (*) <5.7 (%)    Mean Plasma Glucose 126 (*) <117 (mg/dL)   CBC     Status: Abnormal   Collection Time   11/08/10  4:14 AM      Component Value Range Comment   WBC 6.3  4.0 - 10.5 (K/uL)    RBC 4.03 (*) 4.22 - 5.81 (MIL/uL)    Hemoglobin 12.5 (*) 13.0 - 17.0 (g/dL)    HCT 16.1 (*) 09.6 - 52.0 (%)    MCV 93.1  78.0 - 100.0 (fL)    MCH 31.0  26.0 - 34.0 (pg)    MCHC 33.3  30.0 - 36.0 (g/dL)    RDW 04.5  40.9 - 81.1 (%)    Platelets 177  150 - 400 (K/uL)   COMPREHENSIVE METABOLIC PANEL     Status: Abnormal   Collection Time   11/08/10  4:14 AM      Component Value Range Comment   Sodium 140  135 - 145 (mEq/L)    Potassium 3.6  3.5 - 5.1 (mEq/L)    Chloride 103  96 - 112 (mEq/L)    CO2 29  19 - 32 (mEq/L)    Glucose, Bld 150 (*) 70 - 99 (mg/dL)    BUN 8  6 - 23 (mg/dL)    Creatinine, Ser 9.14  0.50 - 1.35 (mg/dL)    Calcium 9.1  8.4 - 10.5 (mg/dL)    Total Protein 5.7 (*) 6.0 - 8.3 (g/dL)    Albumin 3.4 (*) 3.5 - 5.2 (g/dL)    AST 16  0 - 37 (U/L)    ALT 14  0 - 53 (U/L)    Alkaline Phosphatase 52  39 - 117 (U/L)    Total Bilirubin 0.2 (*) 0.3 - 1.2 (mg/dL)    GFR calc non Af Amer >90  >90 (mL/min)    GFR calc Af Amer >90  >90 (mL/min)    Recent Results (from the past 240 hour(s))  MRSA PCR SCREENING     Status: Normal   Collection Time   11/06/10 12:09 AM      Component Value Range Status Comment   MRSA by PCR NEGATIVE  NEGATIVE  Final     No results found.    Medications: I have reviewed the patient's current medications.  Impression: 1. Exacerbation of COPD, improving. 2. Hypertension. 3. Tobacco abuse, ongoing. 4. Marijuana use.  Plan: 1. This continue intravenous steroids. Start oral steroids. 2. Reduce FiO2. 3. If he does well today, he can be discharged home. I've once again stressed the importance of tobacco cessation.     LOS: 3 days   Jace Dowe C 11/08/2010, 8:22 AM

## 2010-11-08 NOTE — Discharge Summary (Signed)
Physician Discharge Summary  Patient ID: [name] [name]: [id number] DOB/AGE: Feb 15, 1950 60 y.o. Primary Care Physician:Margaret Lodema Hong, MD, MD Admit date: 11/05/2010 Discharge date: 11/08/2010    Discharge Diagnoses:  1. COPD exacerbation without evidence of infection. 2. Tobacco abuse, ongoing. 3. Hypertension. 4. Marijuana abuse. 5. Glucose intolerance.   Current Discharge Medication List    START taking these medications   Details  predniSONE (DELTASONE) 20 MG tablet Take 2 tablets daily for 3 days, then 1 tablet daily for 3 days, then half tablet daily for 3 days, then STOP. Qty: 12 tablet, Refills: 0      CONTINUE these medications which have NOT CHANGED   Details  albuterol (PROVENTIL HFA;VENTOLIN HFA) 108 (90 BASE) MCG/ACT inhaler Inhale 2 puffs into the lungs every 4 (four) hours as needed for wheezing. Qty: 1 Inhaler, Refills: 5    albuterol (PROVENTIL) (2.5 MG/3ML) 0.083% nebulizer solution Take 2.5 mg by nebulization as directed. Use one vial every 6 to 8 hours as needed for wheezing Qty: 75 mL, Refills: 5    Fluticasone-Salmeterol (ADVAIR DISKUS) 500-50 MCG/DOSE AEPB Inhale 1 puff into the lungs 2 (two) times daily. Qty: 60 each, Refills: 11   Associated Diagnoses: Asthma with COPD    ipratropium (ATROVENT) 0.02 % nebulizer solution Take 2.5 mLs (500 mcg total) by nebulization as directed. Use one inhalation every 6 to 8 hours as needed for wheezing, pls mix with albuterol solution before use Qty: 75 mL, Refills: 5    simvastatin (ZOCOR) 40 MG tablet Take 1 tablet (40 mg total) by mouth at bedtime. Qty: 30 tablet, Refills: 5    triamterene-hydrochlorothiazide (MAXZIDE-25) 37.5-25 MG per tablet Take 1 tablet by mouth daily.        STOP taking these medications     predniSONE (STERAPRED UNI-PAK) 5 MG TABS         Discharged Condition: Improved and stable.    Consults: None.  Significant Diagnostic Studies: Dg Chest 2 View  11/05/2010   *RADIOLOGY REPORT*  Clinical Data: Shortness of breath today.  History of asthma. Question infiltrate.  CHEST - 2 VIEW  Comparison: 07/20/2010 and 06/22/2010 radiographs; CT 12/31/2009.  Findings: The heart size and mediastinal contours are stable.  The lungs are hyperinflated.  There is stable biapical subpleural scarring.  On the lateral view, there is question of a developing 1.8 cm cavitary lesion between the spine and hila.  The patient did have irregular ground-glass densities in both lungs in this general area on the prior CT.  There is no pleural effusion or confluent airspace opacity.  IMPRESSION:  1.  Possible developing cavitary lesion in one of the lower lobes, only seen on the lateral view.  Follow-up chest CT is recommended, with contrast if possible. 2.  Otherwise stable chronic lung disease.  No evidence of pneumonia.  Original Report Authenticated By: Gerrianne Scale, M.D.   Ct Chest W Contrast  11/05/2010  *RADIOLOGY REPORT*  Clinical Data: Cough, asthma, abnormal chest x-ray with question cavitary lesion  CT CHEST WITH CONTRAST  Technique:  Multidetector CT imaging of the chest was performed following the standard protocol during bolus administration of intravenous contrast. Sagittal and coronal MPR images reconstructed from axial data set.  Contrast: 80mL OMNIPAQUE IOHEXOL 300 MG/ML IV SOLN  Comparison: 12/31/2009 Correlation:  Chest radiograph 11/05/2010  Findings: Thoracic vascular structures appear patent on non dedicated exam. Visualized portion of upper abdomen unremarkable. No thoracic adenopathy. Lungs are emphysematous with diffuse peribronchial thickening. Mild cylindrical bronchiectasis  in right lower lobe. Calcified granuloma right upper lobe image 23. No additional pulmonary mass, nodule, infiltrate, or pleural effusion. Specifically no evidence of cavitary lung lesion or dominant pulmonary mass. No acute osseous findings.  IMPRESSION: Emphysematous and bronchitic changes with  cylindrical bronchiectasis in right lower lobe. No acute pulmonary abnormalities identified.  Original Report Authenticated By: Lollie Marrow, M.D.    Lab Results: Results for orders placed during the hospital encounter of 11/05/10 (from the past 48 hour(s))  CBC     Status: Abnormal   Collection Time   11/08/10  4:14 AM      Component Value Range Comment   WBC 6.3  4.0 - 10.5 (K/uL)    RBC 4.03 (*) 4.22 - 5.81 (MIL/uL)    Hemoglobin 12.5 (*) 13.0 - 17.0 (g/dL)    HCT 04.5 (*) 40.9 - 52.0 (%)    MCV 93.1  78.0 - 100.0 (fL)    MCH 31.0  26.0 - 34.0 (pg)    MCHC 33.3  30.0 - 36.0 (g/dL)    RDW 81.1  91.4 - 78.2 (%)    Platelets 177  150 - 400 (K/uL)   COMPREHENSIVE METABOLIC PANEL     Status: Abnormal   Collection Time   11/08/10  4:14 AM      Component Value Range Comment   Sodium 140  135 - 145 (mEq/L)    Potassium 3.6  3.5 - 5.1 (mEq/L)    Chloride 103  96 - 112 (mEq/L)    CO2 29  19 - 32 (mEq/L)    Glucose, Bld 150 (*) 70 - 99 (mg/dL)    BUN 8  6 - 23 (mg/dL)    Creatinine, Ser 9.56  0.50 - 1.35 (mg/dL)    Calcium 9.1  8.4 - 10.5 (mg/dL)    Total Protein 5.7 (*) 6.0 - 8.3 (g/dL)    Albumin 3.4 (*) 3.5 - 5.2 (g/dL)    AST 16  0 - 37 (U/L)    ALT 14  0 - 53 (U/L)    Alkaline Phosphatase 52  39 - 117 (U/L)    Total Bilirubin 0.2 (*) 0.3 - 1.2 (mg/dL)    GFR calc non Af Amer >90  >90 (mL/min)    GFR calc Af Amer >90  >90 (mL/min)    Recent Results (from the past 240 hour(s))  MRSA PCR SCREENING     Status: Normal   Collection Time   11/06/10 12:09 AM      Component Value Range Status Comment   MRSA by PCR NEGATIVE  NEGATIVE  Final      Hospital Course: This 60 year old man was admitted again with dyspnea. Please see initial history and physical examination. In the emergency room his saturations were in the 80s with ambulation. Unfortunately he continues to smoke cigarettes. His chest x-ray on admission was concerning for cavitated lesion but a CT chest scan confirmed the  presence of bronchiectasis with no evidence of a cavitating lesion. Also on the CT chest scan there was  no evidence of pneumonia. He was to with intravenous steroids and nebulizers. He did well with this and today he feels back to his normal self. He is saturating 96% on minimal oxygen at 2 L per minute. He does not have increased work of breathing.  Discharge Exam: Blood pressure 142/76, pulse 73, temperature 98 F (36.7 C), temperature source Oral, resp. rate 20, height 5\' 7"  (1.702 m), weight 59.1 kg (130 lb 4.7 oz), SpO2  96.00%. He looks systemically well. There is no increased work of breathing. Is not toxic or septic. Heart sounds are present and normal. Lung fields are now clinically clear with no evidence of any significant wheezing. There is minimal scattered wheezing. There are no crackles  and there is no bronchial breathing. He is alert and orientated without any focal neurological signs.  Disposition: Home. He has been advised again to quit smoking. He will followup with his primary care physician towards the end of November. He'll be sent home on a another reducing course of steroids.  Discharge Orders    Future Appointments: Provider: Department: Dept Phone: Center:   12/05/2010 1:15 PM Syliva Overman, MD [facility] [phone] [facility]     Future Orders Please Complete By Expires   Diet - low sodium heart healthy      Increase activity slowly      Discharge instructions      Comments:   Quit smoking!        SignedWilson Singer 11/08/2010, 12:31 PM

## 2010-11-12 ENCOUNTER — Telehealth: Payer: Self-pay | Admitting: Family Medicine

## 2010-11-12 MED ORDER — ALBUTEROL SULFATE (2.5 MG/3ML) 0.083% IN NEBU: 2.5000 mg | INHALATION_SOLUTION | RESPIRATORY_TRACT | Status: DC

## 2010-11-12 NOTE — Telephone Encounter (Signed)
Med sent as requested 

## 2010-11-19 ENCOUNTER — Other Ambulatory Visit: Payer: Self-pay | Admitting: Family Medicine

## 2010-11-21 ENCOUNTER — Encounter: Payer: Self-pay | Admitting: Family Medicine

## 2010-11-22 ENCOUNTER — Ambulatory Visit (INDEPENDENT_AMBULATORY_CARE_PROVIDER_SITE_OTHER): Payer: Medicare Other | Admitting: Family Medicine

## 2010-11-22 ENCOUNTER — Encounter: Payer: Self-pay | Admitting: Family Medicine

## 2010-11-22 VITALS — BP 150/82 | HR 112 | Resp 16 | Ht 67.0 in | Wt 132.0 lb

## 2010-11-22 DIAGNOSIS — F17200 Nicotine dependence, unspecified, uncomplicated: Secondary | ICD-10-CM

## 2010-11-22 DIAGNOSIS — I10 Essential (primary) hypertension: Secondary | ICD-10-CM

## 2010-11-22 DIAGNOSIS — E785 Hyperlipidemia, unspecified: Secondary | ICD-10-CM

## 2010-11-22 DIAGNOSIS — Z125 Encounter for screening for malignant neoplasm of prostate: Secondary | ICD-10-CM

## 2010-11-22 DIAGNOSIS — J45909 Unspecified asthma, uncomplicated: Secondary | ICD-10-CM

## 2010-11-22 DIAGNOSIS — M62838 Other muscle spasm: Secondary | ICD-10-CM

## 2010-11-22 DIAGNOSIS — R7301 Impaired fasting glucose: Secondary | ICD-10-CM

## 2010-11-22 DIAGNOSIS — F1210 Cannabis abuse, uncomplicated: Secondary | ICD-10-CM

## 2010-11-22 MED ORDER — CYCLOBENZAPRINE HCL 10 MG PO TABS: 10.0000 mg | ORAL_TABLET | Freq: Three times a day (TID) | ORAL | Status: DC | PRN

## 2010-11-22 NOTE — Assessment & Plan Note (Signed)
Pressure elevated at this visit, needs to bring meds to next visit, if still high, will need med increase

## 2010-11-22 NOTE — Assessment & Plan Note (Signed)
Severe, recently hospitalized currently doing better, assures me he uses advair regularly, has pulmonary f/u next month

## 2010-11-22 NOTE — Patient Instructions (Signed)
CPE in 4 months. Cancel sooner appt.  Medication is sent in for neck spasm.  Blood pressure slightly elevated today, ensure you take medication every day as prescribed.  Fasting lipid, hepatic, chem 7 , HBA1C and PSA in 4 months BEFORE visit

## 2010-11-22 NOTE — Progress Notes (Signed)
  Subjective:    Patient ID: [name], male    DOB: 24-Mar-1950, 60 y.o.   [name]: [id number]  HPI  Recently d/c from the hospital for COPD exacerbation, was admitted 10/29 o 11/08/2010, c/o right neck spasm x 1 day.Still using nicotine and marijuana, no plan to quit either  Review of Systems See HPI Denies recent fever or chills. Denies sinus pressure, nasal congestion, ear pain or sore throat.  Denies chest pains, palpitations and leg swelling Denies abdominal pain, nausea, vomiting,diarrhea or constipation.   Denies dysuria, frequency, hesitancy or incontinence.  Denies headaches, seizures, numbness, or tingling.  Denies skin break down or rash.        Objective:   Physical Exam Patient alert and oriented and in no cardiopulmonary distress.  HEENT: No facial asymmetry, EOMI, no sinus tenderness,  oropharynx pink and moist.  Neck decreased ROM with left spasm. no adenopathy.  Chest: decreased air entry, wheezes, no crackles CVS: S1, S2 no murmurs, no S3.  ABD: Soft non tender. Bowel sounds normal.  Ext: No edema  MS: Adequate ROM spine, shoulders, hips and knees.  Skin: Intact, no ulcerations or rash noted.  Psych: Good eye contact, normal affect. Memory intact not anxious or depressed appearing.  CNS: CN 2-12 intact, power, tone and sensation normal throughout.        Assessment & Plan:

## 2010-11-25 NOTE — Assessment & Plan Note (Signed)
Ongoing use , no intention of quitting

## 2010-11-25 NOTE — Assessment & Plan Note (Signed)
Unchanged, cessation counselling done

## 2010-11-25 NOTE — Assessment & Plan Note (Signed)
Uncontrolled when last checked, updated data needed

## 2010-11-25 NOTE — Assessment & Plan Note (Signed)
Acute onset, muscle relaxant prescribed

## 2010-12-03 ENCOUNTER — Other Ambulatory Visit: Payer: Self-pay

## 2010-12-03 MED ORDER — IPRATROPIUM BROMIDE 0.02 % IN SOLN: 500.0000 ug | RESPIRATORY_TRACT | Status: DC

## 2010-12-05 ENCOUNTER — Encounter: Payer: Medicare Other | Admitting: Family Medicine

## 2010-12-17 ENCOUNTER — Telehealth: Payer: Self-pay

## 2010-12-17 ENCOUNTER — Other Ambulatory Visit: Payer: Self-pay | Admitting: Family Medicine

## 2010-12-17 MED ORDER — CLOTRIMAZOLE-BETAMETHASONE 1-0.05 % EX CREA: TOPICAL_CREAM | CUTANEOUS | Status: DC

## 2010-12-17 NOTE — Telephone Encounter (Signed)
lotrisone betamethasone cream has been sent in please let him know

## 2010-12-17 NOTE — Telephone Encounter (Signed)
Pt notified of new med sent in to pharmacy

## 2011-01-13 ENCOUNTER — Encounter (HOSPITAL_COMMUNITY): Payer: Self-pay

## 2011-01-13 ENCOUNTER — Other Ambulatory Visit: Payer: Self-pay

## 2011-01-13 ENCOUNTER — Inpatient Hospital Stay (HOSPITAL_COMMUNITY)
Admission: EM | Admit: 2011-01-13 | Discharge: 2011-01-18 | DRG: 189 | Disposition: A | Discharge status: Home / Self Care | Payer: Medicare Other | Attending: Internal Medicine | Admitting: Internal Medicine

## 2011-01-13 ENCOUNTER — Emergency Department (HOSPITAL_COMMUNITY): Payer: Medicare Other

## 2011-01-13 DIAGNOSIS — R7301 Impaired fasting glucose: Secondary | ICD-10-CM

## 2011-01-13 DIAGNOSIS — Z720 Tobacco use: Secondary | ICD-10-CM | POA: Diagnosis present

## 2011-01-13 DIAGNOSIS — R112 Nausea with vomiting, unspecified: Secondary | ICD-10-CM

## 2011-01-13 DIAGNOSIS — J9620 Acute and chronic respiratory failure, unspecified whether with hypoxia or hypercapnia: Principal | ICD-10-CM | POA: Diagnosis present

## 2011-01-13 DIAGNOSIS — E876 Hypokalemia: Secondary | ICD-10-CM | POA: Diagnosis present

## 2011-01-13 DIAGNOSIS — J449 Chronic obstructive pulmonary disease, unspecified: Secondary | ICD-10-CM

## 2011-01-13 DIAGNOSIS — T50905A Adverse effect of unspecified drugs, medicaments and biological substances, initial encounter: Secondary | ICD-10-CM

## 2011-01-13 DIAGNOSIS — J440 Chronic obstructive pulmonary disease with acute lower respiratory infection: Secondary | ICD-10-CM | POA: Diagnosis present

## 2011-01-13 DIAGNOSIS — R9389 Abnormal findings on diagnostic imaging of other specified body structures: Secondary | ICD-10-CM

## 2011-01-13 DIAGNOSIS — R000 Tachycardia, unspecified: Secondary | ICD-10-CM | POA: Diagnosis present

## 2011-01-13 DIAGNOSIS — R7309 Other abnormal glucose: Secondary | ICD-10-CM | POA: Diagnosis present

## 2011-01-13 DIAGNOSIS — E559 Vitamin D deficiency, unspecified: Secondary | ICD-10-CM

## 2011-01-13 DIAGNOSIS — M62838 Other muscle spasm: Secondary | ICD-10-CM

## 2011-01-13 DIAGNOSIS — I10 Essential (primary) hypertension: Secondary | ICD-10-CM

## 2011-01-13 DIAGNOSIS — F1210 Cannabis abuse, uncomplicated: Secondary | ICD-10-CM | POA: Diagnosis present

## 2011-01-13 DIAGNOSIS — R972 Elevated prostate specific antigen [PSA]: Secondary | ICD-10-CM

## 2011-01-13 DIAGNOSIS — J9600 Acute respiratory failure, unspecified whether with hypoxia or hypercapnia: Secondary | ICD-10-CM

## 2011-01-13 DIAGNOSIS — E871 Hypo-osmolality and hyponatremia: Secondary | ICD-10-CM | POA: Diagnosis present

## 2011-01-13 DIAGNOSIS — R739 Hyperglycemia, unspecified: Secondary | ICD-10-CM | POA: Diagnosis present

## 2011-01-13 DIAGNOSIS — Z87891 Personal history of nicotine dependence: Secondary | ICD-10-CM

## 2011-01-13 DIAGNOSIS — D649 Anemia, unspecified: Secondary | ICD-10-CM | POA: Diagnosis not present

## 2011-01-13 DIAGNOSIS — J441 Chronic obstructive pulmonary disease with (acute) exacerbation: Secondary | ICD-10-CM | POA: Diagnosis present

## 2011-01-13 DIAGNOSIS — F1911 Other psychoactive substance abuse, in remission: Secondary | ICD-10-CM | POA: Diagnosis present

## 2011-01-13 DIAGNOSIS — E785 Hyperlipidemia, unspecified: Secondary | ICD-10-CM

## 2011-01-13 DIAGNOSIS — E860 Dehydration: Secondary | ICD-10-CM

## 2011-01-13 DIAGNOSIS — J209 Acute bronchitis, unspecified: Secondary | ICD-10-CM | POA: Diagnosis present

## 2011-01-13 DIAGNOSIS — J45909 Unspecified asthma, uncomplicated: Secondary | ICD-10-CM

## 2011-01-13 DIAGNOSIS — F17200 Nicotine dependence, unspecified, uncomplicated: Secondary | ICD-10-CM

## 2011-01-13 DIAGNOSIS — T380X5A Adverse effect of glucocorticoids and synthetic analogues, initial encounter: Secondary | ICD-10-CM | POA: Diagnosis present

## 2011-01-13 DIAGNOSIS — D509 Iron deficiency anemia, unspecified: Secondary | ICD-10-CM | POA: Diagnosis not present

## 2011-01-13 HISTORY — DX: Other psychoactive substance abuse, in remission: F19.11

## 2011-01-13 HISTORY — DX: Personal history of nicotine dependence: Z87.891

## 2011-01-13 LAB — BASIC METABOLIC PANEL
BUN: 9 mg/dL (ref 6–23)
Chloride: 93 mEq/L — ABNORMAL LOW (ref 96–112)
GFR calc Af Amer: >90 mL/min (ref >90)
GFR calc non Af Amer: 87 mL/min — ABNORMAL LOW (ref >90)
Potassium: 3.1 mEq/L — ABNORMAL LOW (ref 3.5–5.1)
Sodium: 134 mEq/L — ABNORMAL LOW (ref 135–145)

## 2011-01-13 LAB — CBC
Hemoglobin: 14.7 g/dL (ref 13.0–17.0)
MCHC: 33.6 g/dL (ref 30.0–36.0)
WBC: 5.8 10*3/uL (ref 4.0–10.5)

## 2011-01-13 LAB — PRO B NATRIURETIC PEPTIDE: Pro B Natriuretic peptide (BNP): 21.2 pg/mL (ref 0–125)

## 2011-01-13 LAB — TROPONIN I: Troponin I: <0.3 ng/mL (ref <0.30)

## 2011-01-13 MED ORDER — SODIUM CHLORIDE 0.9 % IV BOLUS (SEPSIS): 1000.0000 mL | Freq: Once | INTRAVENOUS | Status: DC

## 2011-01-13 MED ORDER — LEVALBUTEROL HCL 1.25 MG/0.5ML IN NEBU
1.2500 mg | INHALATION_SOLUTION | Freq: Once | RESPIRATORY_TRACT | Status: DC
  Filled 2011-01-13: qty 0.5

## 2011-01-13 MED ORDER — IPRATROPIUM BROMIDE 0.02 % IN SOLN: 0.5000 mg | Freq: Once | RESPIRATORY_TRACT | Status: DC

## 2011-01-13 MED ORDER — ACETAMINOPHEN 325 MG PO TABS
650.0000 mg | ORAL_TABLET | Freq: Once | ORAL | Status: AC
  Administered 2011-01-13: 650 mg via ORAL
  Filled 2011-01-13: qty 2

## 2011-01-13 MED ORDER — SODIUM CHLORIDE 0.9 % IV SOLN: INTRAVENOUS | Status: DC

## 2011-01-13 MED ORDER — ALBUTEROL SULFATE (5 MG/ML) 0.5% IN NEBU: 2.5000 mg | INHALATION_SOLUTION | RESPIRATORY_TRACT | Status: DC | PRN

## 2011-01-13 MED ORDER — IPRATROPIUM BROMIDE 0.02 % IN SOLN
RESPIRATORY_TRACT | Status: AC
  Administered 2011-01-13: 0.5 mg
  Filled 2011-01-13: qty 2.5

## 2011-01-13 MED ORDER — LEVALBUTEROL HCL 0.63 MG/3ML IN NEBU
0.6300 mg | INHALATION_SOLUTION | Freq: Once | RESPIRATORY_TRACT | Status: AC
  Administered 2011-01-13: 0.63 mg via RESPIRATORY_TRACT
  Filled 2011-01-13: qty 3

## 2011-01-13 MED ORDER — MOXIFLOXACIN HCL IN NACL 400 MG/250ML IV SOLN
400.0000 mg | Freq: Once | INTRAVENOUS | Status: AC
  Administered 2011-01-13: 400 mg via INTRAVENOUS
  Filled 2011-01-13: qty 250

## 2011-01-13 MED ORDER — LEVALBUTEROL HCL 1.25 MG/0.5ML IN NEBU
INHALATION_SOLUTION | RESPIRATORY_TRACT | Status: AC
  Administered 2011-01-13: 1.25 mg
  Filled 2011-01-13: qty 0.5

## 2011-01-13 MED ORDER — METHYLPREDNISOLONE SODIUM SUCC 125 MG IJ SOLR
125.0000 mg | Freq: Four times a day (QID) | INTRAMUSCULAR | Status: DC
  Administered 2011-01-13: 125 mg via INTRAVENOUS
  Filled 2011-01-13: qty 2

## 2011-01-13 NOTE — ED Provider Notes (Signed)
History    CSN: [id number] Arrival date & time 01/13/11  2010 First MD Initiated Contact with Patient 01/13/11 2030   Chief Complaint  Patient presents with  . Shortness of Breath  . Wheezing  . Cough  Patient is a 61 y.o. male presenting with shortness of breath, wheezing, and cough. The history is provided by the patient.  Shortness of Breath  The current episode started 3 to 5 days ago. The problem occurs continuously. The problem has been gradually worsening. The problem is severe. The symptoms are relieved by nothing. The symptoms are aggravated by activity. Associated symptoms include a fever, cough, shortness of breath and wheezing. There is no color change associated with the cough. Nothing relieves the cough. Past medical history comments: COPD.  Wheezing  Associated symptoms include a fever, cough, shortness of breath and wheezing. Past medical history comments: COPD.  Cough Associated symptoms include shortness of breath and wheezing. Past medical history comments: COPD.   patient has history of COPD. He has been trying his breathing treatments without relief. Patient denies any chest pain or swelling. He has no history of pulmonary embolism. The symptoms became much more severe today. He did try one of his breathing treatments just before coming into the emergency room.  Past Medical History  Diagnosis Date  . Nicotine addiction   . COPD (chronic obstructive pulmonary disease)   . Depression   . Hyperlipidemia   . Hypertension   . CVA (cerebral vascular accident) 2008    with temporary vision loss   . Asthma   . Marijuana abuse     History reviewed. No pertinent past surgical history.  Family History  Problem Relation Age of Onset  . Lung disease Sister     History  Substance Use Topics  . Smoking status: Current Everyday Smoker -- 0.3 packs/day    Types: Cigarettes  . Smokeless tobacco: Not on file  . Alcohol Use: Yes     occasionally      Review of Systems   Constitutional: Positive for fever.  Respiratory: Positive for cough, shortness of breath and wheezing.   All other systems reviewed and are negative.    Allergies  Review of patient's allergies indicates no known allergies.  Home Medications   Current Outpatient Rx  Name Route Sig Dispense Refill  . ALBUTEROL SULFATE HFA 108 (90 BASE) MCG/ACT IN AERS Inhalation Inhale 2 puffs into the lungs every 4 (four) hours as needed for wheezing. 1 Inhaler 5  . ALBUTEROL SULFATE (2.5 MG/3ML) 0.083% IN NEBU Nebulization Take 3 mLs (2.5 mg total) by nebulization as directed. Use one vial every 6 to 8 hours as needed for wheezing 75 mL 5  . CLOTRIMAZOLE-BETAMETHASONE 1-0.05 % EX CREA  Apply to affected area 2 times daily 45 g 0  . CYCLOBENZAPRINE HCL 10 MG PO TABS Oral Take 1 tablet (10 mg total) by mouth every 8 (eight) hours as needed for muscle spasms. 30 tablet 0  . FLUTICASONE-SALMETEROL 500-50 MCG/DOSE IN AEPB Inhalation Inhale 1 puff into the lungs 2 (two) times daily. 60 each 11  . IPRATROPIUM BROMIDE 0.02 % IN SOLN Nebulization Take 2.5 mLs (500 mcg total) by nebulization as directed. Use one inhalation every 6 to 8 hours as needed for wheezing, pls mix with albuterol solution before use 75 mL 5  . SIMVASTATIN 40 MG PO TABS Oral Take 1 tablet (40 mg total) by mouth at bedtime. 30 tablet 5  . TRIAMTERENE-HCTZ 37.5-25 MG PO TABS  TAKE ONE TABLET BY MOUTH ONCE DAILY. 30 tablet 4    BP 154/79  Pulse 151  Temp(Src) 99.3 F (37.4 C) (Oral)  Resp 30  Ht 5\' 7"  (1.702 m)  Wt 132 lb (59.875 kg)  BMI 20.67 kg/m2  SpO2 91%  Physical Exam  Nursing note and vitals reviewed. Constitutional: He appears distressed.  HENT:  Head: Normocephalic and atraumatic.  Right Ear: External ear normal.  Left Ear: External ear normal.  Eyes: Conjunctivae are normal. Right eye exhibits no discharge. Left eye exhibits no discharge. No scleral icterus.  Neck: Neck supple. No JVD present. No tracheal deviation  present.  Cardiovascular: Regular rhythm, intact distal pulses and normal pulses.  Tachycardia present.   Pulmonary/Chest: Accessory muscle usage present. No stridor. Tachypnea noted. He is in respiratory distress. He has decreased breath sounds. He has wheezes. He has no rales.       Able to speak in few word sentences  Abdominal: Soft. Bowel sounds are normal. He exhibits no distension. There is no tenderness. There is no rebound and no guarding.  Musculoskeletal: He exhibits no edema and no tenderness.  Neurological: He is alert. He has normal strength. No sensory deficit. Cranial nerve deficit:  no gross defecits noted. He exhibits normal muscle tone. He displays no seizure activity. Coordination normal.  Skin: Skin is warm and dry. No rash noted. He is not diaphoretic.  Psychiatric: He has a normal mood and affect.    ED Course  Procedures (including critical care time)  Date: 01/13/2011  Rate: 152  Rhythm: sinus tachycardia  QRS Axis: left  Intervals: normal  ST/T Wave abnormalities: normal  Conduction Disutrbances:none  Narrative Interpretation: Pulmonary disease pattern  Old EKG Reviewed: none available  Medications  methylPREDNISolone sodium succinate (SOLU-MEDROL) 125 MG injection 125 mg (125 mg Intravenous Given 01/13/11 2109)  levalbuterol (XOPENEX) nebulizer solution 1.25 mg (not administered)  ipratropium (ATROVENT) nebulizer solution 0.5 mg (not administered)  sodium chloride 0.9 % bolus 1,000 mL (not administered)  acetaminophen (TYLENOL) tablet 650 mg (not administered)  moxifloxacin (AVELOX) IVPB 400 mg (not administered)  levalbuterol (XOPENEX) nebulizer solution 0.63 mg (not administered)  0.9 %  sodium chloride infusion (not administered)  albuterol (PROVENTIL) (5 MG/ML) 0.5% nebulizer solution 2.5 mg (not administered)  ipratropium (ATROVENT) 0.02 % nebulizer solution (0.5 mg  Given 01/13/11 2039)  levalbuterol (XOPENEX) 1.25 MG/0.5ML nebulizer solution (1.25 mg   Given 01/13/11 2039)    Labs Reviewed  BASIC METABOLIC PANEL - Abnormal; Notable for the following:    Sodium 134 (*)    Potassium 3.1 (*)    Chloride 93 (*)    Glucose, Bld 121 (*)    GFR calc non Af Amer 87 (*)    All other components within normal limits  PRO B NATRIURETIC PEPTIDE  CBC  TROPONIN I  URINE RAPID DRUG SCREEN (HOSP PERFORMED)  INFLUENZA PANEL BY PCR   Dg Chest Port 1 View  01/13/2011  *RADIOLOGY REPORT*  Clinical Data: Tachycardia.  Shortness of breath.  PORTABLE CHEST - 1 VIEW 01/13/2011 2115 hours:  Comparison: Two-view chest x-ray and CTA chest 11/05/2010, two-view chest x-ray 07/20/2010 and 03/23/2009 [facility]  Findings: Hyperinflation and emphysematous changes throughout both lungs, unchanged.  Lungs clear.  Stable moderate central peribronchial thickening.  Cardiomediastinal silhouette unremarkable and unchanged.  No pleural effusions.  IMPRESSION: Stable COPD/emphysema.  No acute cardiopulmonary disease.  Original Report Authenticated By: Arnell Sieving, M.D.     1. COPD exacerbation  2. Tachycardia   3. Acute respiratory failure       MDM  Patient has been given Xopenex and Atrovent. Also start him on BiPAP and started him on IV steroids. On repeat exam patient is noticing some improvement her peak still continues to be tachypneic and is tachycardic although his heart rate is now down to 140. We'll continue with IV fluid boluses and given acetaminophen as he mentioned having fevers. His EKG shows a sinus tach and there does not appear to be any dysrhythmia. I suspect his tachycardia as a combination of his difficulty breathing associated with the beta agonists. Patient will be admitted to the hospital for further treatment. He'll likely need to go to the ICU.  CRITICAL CARE Performed by: Celene Kras   Total critical care time: 30  Critical care time was exclusive of separately billable procedures and treating other patients.  Critical care  was necessary to treat or prevent imminent or life-threatening deterioration.  Critical care was time spent personally by me on the following activities: development of treatment plan with patient and/or surrogate as well as nursing, discussions with consultants, evaluation of patient's response to treatment, examination of patient, obtaining history from patient or surrogate, ordering and performing treatments and interventions, ordering and review of laboratory studies, ordering and review of radiographic studies, pulse oximetry and re-evaluation of patient's condition.         Celene Kras, MD 01/13/11 2229

## 2011-01-13 NOTE — ED Notes (Signed)
Pt placed on O2 @ 2L [place] and placed on cardiac monitor.

## 2011-01-13 NOTE — H&P (Signed)
PCP:   Syliva Overman, MD, MD   Chief Complaint:  Shortness of breath and wheezing for 3 days  HPI: 61 year old male with a history of emphysema hypertension and drug abuse this emergency department with over 48 hours of worsening shortness of breath and wheezing. He denies any fevers he did recently have a upper respiratory tract infection which she is recovered from the sides the wheezing. He did get the flu vaccine this year. He denies any nausea vomiting diarrhea abdominal pain chest pain. He is receiving BiPAP and frequent nebulizers with Solu-Medrol and antibiotics in the emergency department and feels much better already. He does not require oxygen at home.  Review of Systems:  Otherwise negative  Past Medical History: Past Medical History  Diagnosis Date  . Nicotine addiction   . COPD (chronic obstructive pulmonary disease)   . Depression   . Hyperlipidemia   . Hypertension   . CVA (cerebral vascular accident) 2008    with temporary vision loss   . Asthma   . Marijuana abuse    History reviewed. No pertinent past surgical history.  Medications: Prior to Admission medications   Medication Sig Start Date End Date Taking? Authorizing Provider  albuterol (PROVENTIL HFA;VENTOLIN HFA) 108 (90 BASE) MCG/ACT inhaler Inhale 2 puffs into the lungs every 4 (four) hours as needed for wheezing. 10/11/10 10/11/11  Syliva Overman, MD  albuterol (PROVENTIL) (2.5 MG/3ML) 0.083% nebulizer solution Take 3 mLs (2.5 mg total) by nebulization as directed. Use one vial every 6 to 8 hours as needed for wheezing 11/12/10   Syliva Overman, MD  clotrimazole-betamethasone Thurmond Butts) cream Apply to affected area 2 times daily 12/17/10 12/17/11  Syliva Overman, MD  cyclobenzaprine (FLEXERIL) 10 MG tablet Take 1 tablet (10 mg total) by mouth every 8 (eight) hours as needed for muscle spasms. 11/22/10 11/22/11  Syliva Overman, MD  Fluticasone-Salmeterol (ADVAIR DISKUS) 500-50 MCG/DOSE AEPB Inhale  1 puff into the lungs 2 (two) times daily. 04/17/10 04/17/11  Syliva Overman, MD  ipratropium (ATROVENT) 0.02 % nebulizer solution Take 2.5 mLs (500 mcg total) by nebulization as directed. Use one inhalation every 6 to 8 hours as needed for wheezing, pls mix with albuterol solution before use 12/03/10   Syliva Overman, MD  simvastatin (ZOCOR) 40 MG tablet Take 1 tablet (40 mg total) by mouth at bedtime. 10/02/10   Syliva Overman, MD  triamterene-hydrochlorothiazide (MAXZIDE-25) 37.5-25 MG per tablet TAKE ONE TABLET BY MOUTH ONCE DAILY. 11/19/10   Syliva Overman, MD    Allergies:  No Known Allergies  Social History:  reports that he has been smoking Cigarettes.  He has been smoking about .3 packs per day. He does not have any smokeless tobacco history on file. He reports that he drinks alcohol. He reports that he uses illicit drugs (Marijuana).  Family History: Family History  Problem Relation Age of Onset  . Lung disease Sister     Physical Exam: Filed Vitals:   01/13/11 2016 01/13/11 2041 01/13/11 2104 01/13/11 2302  BP: 154/79  144/91   Pulse: 151  150   Temp: 99.3 F (37.4 C)     TempSrc: Oral     Resp: 30  28   Height: 5\' 7"  (1.702 m)     Weight: 59.875 kg (132 lb)     SpO2: 91% 93% 98% 98%   BP 110/68  Pulse 93  Temp(Src) 98.1 F (36.7 C) (Oral)  Resp 25  Ht 5\' 7"  (1.702 m)  Wt 56.4 kg (124 lb 5.4  oz)  BMI 19.47 kg/m2  SpO2 94% General appearance: alert, cooperative and no distress chronically ill-appearing Lungs: wheezes bibasilar Heart: regular rate and rhythm, S1, S2 normal, no murmur, click, rub or gallop Abdomen: soft, non-tender; bowel sounds normal; no masses,  no organomegaly Extremities: extremities normal, atraumatic, no cyanosis or edema Pulses: 2+ and symmetric Skin: Skin color, texture, turgor normal. No rashes or lesions Neurologic: Grossly normal    Labs on Admission:   [facility] 01/13/11 2104  NA 134*  K 3.1*  CL 93*  CO2 30  GLUCOSE  121*  BUN 9  CREATININE 0.95  CALCIUM 10.5  MG --  PHOS --    Basename 01/13/11 2104  WBC 5.8  NEUTROABS --  HGB 14.7  HCT 43.8  MCV 91.3  PLT 237    Basename 01/13/11 2104  CKTOTAL --  CKMB --  CKMBINDEX --  TROPONINI <0.30   Radiological Exams on Admission: Dg Chest Port 1 View  01/13/2011  *RADIOLOGY REPORT*  Clinical Data: Tachycardia.  Shortness of breath.  PORTABLE CHEST - 1 VIEW 01/13/2011 2115 hours:  Comparison: Two-view chest x-ray and CTA chest 11/05/2010, two-view chest x-ray 07/20/2010 and 03/23/2009 [facility].  Findings: Hyperinflation and emphysematous changes throughout both lungs, unchanged.  Lungs clear.  Stable moderate central peribronchial thickening.  Cardiomediastinal silhouette unremarkable and unchanged.  No pleural effusions.  IMPRESSION: Stable COPD/emphysema.  No acute cardiopulmonary disease.  Original Report Authenticated By: Arnell Sieving, M.D.    Assessment/Plan Present on Admission:  61 year old male with COPD exacerbation  .Acute and chronic respiratory failure (acute-on-chronic) secondary COPD exacerbation placed on Solu-Medrol antibiotics frequent nebulizers and oxygen as needed. Also placed on BiPAP as needed. He's had significant improvement with BiPAP that was briefly placed in the ED we'll use as needed.  .CHRONIC OBSTRUCTIVE PULMONARY DISEASE, ACUTE EXACERBATION .Marijuana abuse check urine drug screen  .Tachycardia we'll change his albuterol nebs to Xopenex  Adaysha Dubinsky A [phone] 01/13/2011, 11:25 PM

## 2011-01-13 NOTE — ED Notes (Signed)
Pt presents with SOB, Wheezing, and cough since Wednesday. Pt with labored breathing. Wheezing present bilaterally. Pt placed on O2 @2L .

## 2011-01-14 ENCOUNTER — Encounter (HOSPITAL_COMMUNITY): Payer: Self-pay | Admitting: *Deleted

## 2011-01-14 DIAGNOSIS — F1911 Other psychoactive substance abuse, in remission: Secondary | ICD-10-CM | POA: Diagnosis present

## 2011-01-14 DIAGNOSIS — T50905A Adverse effect of unspecified drugs, medicaments and biological substances, initial encounter: Secondary | ICD-10-CM | POA: Diagnosis present

## 2011-01-14 DIAGNOSIS — R739 Hyperglycemia, unspecified: Secondary | ICD-10-CM | POA: Diagnosis present

## 2011-01-14 DIAGNOSIS — D509 Iron deficiency anemia, unspecified: Secondary | ICD-10-CM | POA: Diagnosis not present

## 2011-01-14 DIAGNOSIS — E871 Hypo-osmolality and hyponatremia: Secondary | ICD-10-CM | POA: Diagnosis present

## 2011-01-14 DIAGNOSIS — D649 Anemia, unspecified: Secondary | ICD-10-CM | POA: Diagnosis not present

## 2011-01-14 DIAGNOSIS — Z720 Tobacco use: Secondary | ICD-10-CM | POA: Diagnosis present

## 2011-01-14 DIAGNOSIS — Z87891 Personal history of nicotine dependence: Secondary | ICD-10-CM

## 2011-01-14 DIAGNOSIS — E876 Hypokalemia: Secondary | ICD-10-CM | POA: Diagnosis present

## 2011-01-14 HISTORY — DX: Other psychoactive substance abuse, in remission: F19.11

## 2011-01-14 HISTORY — DX: Personal history of nicotine dependence: Z87.891

## 2011-01-14 LAB — RAPID URINE DRUG SCREEN, HOSP PERFORMED
Amphetamines: NOT DETECTED
Cocaine: NOT DETECTED
Opiates: NOT DETECTED

## 2011-01-14 LAB — CARDIAC PANEL(CRET KIN+CKTOT+MB+TROPI)
CK, MB: 12.4 ng/mL (ref 0.3–4.0)
CK, MB: 11.3 ng/mL (ref 0.3–4.0)
Relative Index: 0.9 (ref 0.0–2.5)
Total CK: 1624 U/L — ABNORMAL HIGH (ref 7–232)
Total CK: 1345 U/L — ABNORMAL HIGH (ref 7–232)
Troponin I: <0.3 ng/mL (ref <0.30)

## 2011-01-14 LAB — BASIC METABOLIC PANEL
Calcium: 9.5 mg/dL (ref 8.4–10.5)
Chloride: 97 mEq/L (ref 96–112)
Creatinine, Ser: 0.85 mg/dL (ref 0.50–1.35)
GFR calc Af Amer: >90 mL/min (ref >90)
Sodium: 135 mEq/L (ref 135–145)

## 2011-01-14 LAB — GLUCOSE, CAPILLARY
Glucose-Capillary: 153 mg/dL — ABNORMAL HIGH (ref 70–99)
Glucose-Capillary: 165 mg/dL — ABNORMAL HIGH (ref 70–99)

## 2011-01-14 LAB — CBC
HCT: 37.3 % — ABNORMAL LOW (ref 39.0–52.0)
Platelets: 194 10*3/uL (ref 150–400)
RBC: 4.08 MIL/uL — ABNORMAL LOW (ref 4.22–5.81)
RDW: 12.5 % (ref 11.5–15.5)
WBC: 4.4 10*3/uL (ref 4.0–10.5)

## 2011-01-14 LAB — URINALYSIS, ROUTINE W REFLEX MICROSCOPIC
Ketones, ur: NEGATIVE mg/dL
Leukocytes, UA: NEGATIVE
Nitrite: NEGATIVE
pH: 5.5 (ref 5.0–8.0)

## 2011-01-14 MED ORDER — MOXIFLOXACIN HCL IN NACL 400 MG/250ML IV SOLN
400.0000 mg | INTRAVENOUS | Status: DC
  Administered 2011-01-14 – 2011-01-17 (×4): 400 mg via INTRAVENOUS
  Filled 2011-01-14 (×7): qty 250

## 2011-01-14 MED ORDER — SODIUM CHLORIDE 0.9 % IJ SOLN: 3.0000 mL | INTRAMUSCULAR | Status: DC | PRN

## 2011-01-14 MED ORDER — INSULIN ASPART 100 UNIT/ML ~~LOC~~ SOLN
0.0000 [IU] | Freq: Every day | SUBCUTANEOUS | Status: DC
  Administered 2011-01-15: 2 [IU] via SUBCUTANEOUS

## 2011-01-14 MED ORDER — LEVALBUTEROL HCL 1.25 MG/0.5ML IN NEBU
1.2500 mg | INHALATION_SOLUTION | Freq: Three times a day (TID) | RESPIRATORY_TRACT | Status: DC
  Administered 2011-01-15: 1.25 mg via RESPIRATORY_TRACT
  Filled 2011-01-14: qty 0.5

## 2011-01-14 MED ORDER — ALBUTEROL SULFATE (5 MG/ML) 0.5% IN NEBU
2.5000 mg | INHALATION_SOLUTION | RESPIRATORY_TRACT | Status: DC | PRN
  Administered 2011-01-15 – 2011-01-18 (×3): 2.5 mg via RESPIRATORY_TRACT
  Filled 2011-01-14 (×3): qty 0.5

## 2011-01-14 MED ORDER — LEVALBUTEROL HCL 0.63 MG/3ML IN NEBU
0.6300 mg | INHALATION_SOLUTION | Freq: Four times a day (QID) | RESPIRATORY_TRACT | Status: DC | PRN
  Administered 2011-01-14: 0.63 mg via RESPIRATORY_TRACT

## 2011-01-14 MED ORDER — LEVALBUTEROL HCL 1.25 MG/0.5ML IN NEBU
INHALATION_SOLUTION | RESPIRATORY_TRACT | Status: AC
  Administered 2011-01-14: 1.25 mg
  Filled 2011-01-14: qty 0.5

## 2011-01-14 MED ORDER — INSULIN ASPART 100 UNIT/ML ~~LOC~~ SOLN
0.0000 [IU] | Freq: Three times a day (TID) | SUBCUTANEOUS | Status: DC
  Administered 2011-01-14: 3 [IU] via SUBCUTANEOUS
  Administered 2011-01-14 (×2): 2 [IU] via SUBCUTANEOUS
  Administered 2011-01-15 – 2011-01-16 (×4): 3 [IU] via SUBCUTANEOUS
  Administered 2011-01-16: 2 [IU] via SUBCUTANEOUS
  Administered 2011-01-17: 3 [IU] via SUBCUTANEOUS
  Administered 2011-01-17: 2 [IU] via SUBCUTANEOUS
  Administered 2011-01-17: 3 [IU] via SUBCUTANEOUS
  Administered 2011-01-18: 2 [IU] via SUBCUTANEOUS
  Administered 2011-01-18: 3 [IU] via SUBCUTANEOUS
  Filled 2011-01-14 (×2): qty 3

## 2011-01-14 MED ORDER — POTASSIUM CHLORIDE IN NACL 20-0.9 MEQ/L-% IV SOLN
INTRAVENOUS | Status: DC
  Administered 2011-01-14 – 2011-01-15 (×2): via INTRAVENOUS

## 2011-01-14 MED ORDER — MOXIFLOXACIN HCL IN NACL 400 MG/250ML IV SOLN
400.0000 mg | INTRAVENOUS | Status: DC
  Filled 2011-01-14: qty 250

## 2011-01-14 MED ORDER — IPRATROPIUM BROMIDE 0.02 % IN SOLN
0.5000 mg | Freq: Three times a day (TID) | RESPIRATORY_TRACT | Status: DC
  Administered 2011-01-15: 0.5 mg via RESPIRATORY_TRACT
  Filled 2011-01-14: qty 2.5

## 2011-01-14 MED ORDER — POTASSIUM CHLORIDE CRYS ER 20 MEQ PO TBCR
20.0000 meq | EXTENDED_RELEASE_TABLET | Freq: Once | ORAL | Status: AC
  Administered 2011-01-14: 20 meq via ORAL
  Filled 2011-01-14: qty 1

## 2011-01-14 MED ORDER — POTASSIUM CHLORIDE CRYS ER 20 MEQ PO TBCR
20.0000 meq | EXTENDED_RELEASE_TABLET | Freq: Two times a day (BID) | ORAL | Status: DC
  Administered 2011-01-14 (×2): 20 meq via ORAL
  Filled 2011-01-14 (×3): qty 1

## 2011-01-14 MED ORDER — INSULIN GLARGINE 100 UNIT/ML ~~LOC~~ SOLN
15.0000 [IU] | Freq: Every day | SUBCUTANEOUS | Status: DC
  Administered 2011-01-14 – 2011-01-15 (×2): 15 [IU] via SUBCUTANEOUS
  Filled 2011-01-14: qty 3

## 2011-01-14 MED ORDER — LEVALBUTEROL HCL 0.63 MG/3ML IN NEBU
0.6300 mg | INHALATION_SOLUTION | Freq: Four times a day (QID) | RESPIRATORY_TRACT | Status: DC
  Administered 2011-01-14 (×2): 0.63 mg via RESPIRATORY_TRACT
  Filled 2011-01-14 (×2): qty 3

## 2011-01-14 MED ORDER — LEVALBUTEROL HCL 1.25 MG/0.5ML IN NEBU
1.2500 mg | INHALATION_SOLUTION | RESPIRATORY_TRACT | Status: DC
  Administered 2011-01-14: 1.25 mg via RESPIRATORY_TRACT
  Filled 2011-01-14: qty 3
  Filled 2011-01-14: qty 0.5

## 2011-01-14 MED ORDER — IPRATROPIUM BROMIDE 0.02 % IN SOLN
0.5000 mg | RESPIRATORY_TRACT | Status: DC
  Administered 2011-01-14 (×3): 0.5 mg via RESPIRATORY_TRACT
  Filled 2011-01-14 (×3): qty 2.5

## 2011-01-14 MED ORDER — CYCLOBENZAPRINE HCL 10 MG PO TABS: 10.0000 mg | ORAL_TABLET | Freq: Three times a day (TID) | ORAL | Status: DC | PRN

## 2011-01-14 MED ORDER — SIMVASTATIN 20 MG PO TABS
40.0000 mg | ORAL_TABLET | Freq: Every day | ORAL | Status: DC
  Administered 2011-01-14 – 2011-01-17 (×4): 40 mg via ORAL
  Filled 2011-01-14 (×4): qty 2

## 2011-01-14 MED ORDER — METHYLPREDNISOLONE SODIUM SUCC 125 MG IJ SOLR
60.0000 mg | Freq: Four times a day (QID) | INTRAMUSCULAR | Status: DC
  Administered 2011-01-14 – 2011-01-18 (×17): 60 mg via INTRAVENOUS
  Filled 2011-01-14 (×17): qty 2

## 2011-01-14 MED ORDER — SODIUM CHLORIDE 0.9 % IV SOLN: 250.0000 mL | INTRAVENOUS | Status: DC | PRN

## 2011-01-14 MED ORDER — SODIUM CHLORIDE 0.9 % IJ SOLN
3.0000 mL | Freq: Two times a day (BID) | INTRAMUSCULAR | Status: DC
  Administered 2011-01-14 – 2011-01-18 (×7): 3 mL via INTRAVENOUS
  Filled 2011-01-14 (×6): qty 3

## 2011-01-14 MED ORDER — IPRATROPIUM BROMIDE 0.02 % IN SOLN
0.5000 mg | Freq: Four times a day (QID) | RESPIRATORY_TRACT | Status: DC
  Administered 2011-01-14 (×2): 0.5 mg via RESPIRATORY_TRACT
  Filled 2011-01-14 (×2): qty 2.5

## 2011-01-14 NOTE — Progress Notes (Signed)
Subjective: The patient says that he is not quite as short of breath as he was when he first came in. He has no complaints of chest pain. He still has some chest congestion.  Objective: Vital signs in last 24 hours: Filed Vitals:   01/14/11 0400 01/14/11 0500 01/14/11 0600 01/14/11 0720  BP: 111/73 108/70 109/63   Pulse: 92 82 81   Temp: 97.4 F (36.3 C)     TempSrc: Oral     Resp: 23 20 18    Height:      Weight:      SpO2: 97% 95% 95% 95%    Intake/Output Summary (Last 24 hours) at 01/14/11 0743 Last data filed at 01/14/11 0500  Gross per 24 hour  Intake      0 ml  Output    400 ml  Net   -400 ml    Weight change:   Physical exam: Lungs: Bilateral fine wheezes. Heart: S1, S2, with no murmurs rubs or gallops. Abdomen: Positive bowel sounds, soft, nontender, nondistended. Extremities: No pedal edema. Neurologic: He is alert and oriented x2. Cranial nerves II through XII are intact.  Lab Results: Basic Metabolic Panel:  Basename 01/14/11 0415 01/13/11 2104  NA 135 134*  K 3.6 3.1*  CL 97 93*  CO2 30 30  GLUCOSE 155* 121*  BUN 10 9  CREATININE 0.85 0.95  CALCIUM 9.5 10.5  MG -- --  PHOS -- --   Liver Function Tests: No results found for this basename: AST:2,ALT:2,ALKPHOS:2,BILITOT:2,PROT:2,ALBUMIN:2 in the last 72 hours No results found for this basename: LIPASE:2,AMYLASE:2 in the last 72 hours No results found for this basename: AMMONIA:2 in the last 72 hours CBC:  Basename 01/14/11 0415 01/13/11 2104  WBC 4.4 5.8  NEUTROABS -- --  HGB 12.4* 14.7  HCT 37.3* 43.8  MCV 91.4 91.3  PLT 194 237   Cardiac Enzymes:  Basename 01/14/11 0415 01/13/11 2104  CKTOTAL 1624* --  CKMB PENDING --  CKMBINDEX -- --  TROPONINI <0.30 <0.30   BNP:  Basename 01/13/11 2104  PROBNP 21.2   D-Dimer: No results found for this basename: DDIMER:2 in the last 72 hours CBG: No results found for this basename: GLUCAP:6 in the last 72 hours Hemoglobin A1C: No results  found for this basename: HGBA1C in the last 72 hours Fasting Lipid Panel: No results found for this basename: CHOL,HDL,LDLCALC,TRIG,CHOLHDL,LDLDIRECT in the last 72 hours Thyroid Function Tests: No results found for this basename: TSH,T4TOTAL,FREET4,T3FREE,THYROIDAB in the last 72 hours Anemia Panel: No results found for this basename: VITAMINB12,FOLATE,FERRITIN,TIBC,IRON,RETICCTPCT in the last 72 hours Coagulation: No results found for this basename: LABPROT:2,INR:2 in the last 72 hours Urine Drug Screen: Drugs of Abuse     Component Value Date/Time   LABOPIA NONE DETECTED 01/14/2011 0517   COCAINSCRNUR NONE DETECTED 01/14/2011 0517   LABBENZ NONE DETECTED 01/14/2011 0517   AMPHETMU NONE DETECTED 01/14/2011 0517   THCU NONE DETECTED 01/14/2011 0517   LABBARB NONE DETECTED 01/14/2011 0517    Alcohol Level: No results found for this basename: ETH:2 in the last 72 hours Urinalysis:   Micro: No results found for this or any previous visit (from the past 240 hour(s)).  Studies/Results: Dg Chest Port 1 View  01/13/2011  *RADIOLOGY REPORT*  Clinical Data: Tachycardia.  Shortness of breath.  PORTABLE CHEST - 1 VIEW 01/13/2011 2115 hours:  Comparison: Two-view chest x-ray and CTA chest 11/05/2010, two-view chest x-ray 07/20/2010 and 03/23/2009 [facility].  Findings: Hyperinflation and emphysematous changes throughout  both lungs, unchanged.  Lungs clear.  Stable moderate central peribronchial thickening.  Cardiomediastinal silhouette unremarkable and unchanged.  No pleural effusions.  IMPRESSION: Stable COPD/emphysema.  No acute cardiopulmonary disease.  Original Report Authenticated By: Arnell Sieving, M.D.    Medications: I have reviewed the patient's current medications.  Assessment: Principal Problem:  *Acute and chronic respiratory failure (acute-on-chronic) Active Problems:  CHRONIC OBSTRUCTIVE PULMONARY DISEASE, ACUTE EXACERBATION  Tachycardia  Hyponatremia  Hypokalemia   History of tobacco abuse  History of substance abuse  Anemia  1. Emphysema/COPD with acute exacerbation/acute on chronic respiratory failure. He is currently being treated with bronchodilators, Solu-Medrol, Avelox, oxygen, and supportive treatment. Now that he is not tachycardic, will increase the frequency of Xopenex and Atrovent to every 4 hours.  Steroid-induced hyperglycemia.  Elevated CK and normal troponin I. Question mild rhabdomyolysis.  Mild anemia.  Sinus tachycardia. Now resolved.  Hyponatremia, now resolved.  Hypokalemia, resolved with repletion.  History of tobacco and marijuana use. His urine drug screen is negative. The patient says that he stopped smoking marijuana and cigarettes months ago.   Plan:  1. We'll add gentle IV fluids. We'll add sliding scale NovoLog and Lantus for treatment of hyperglycemia.  Continue potassium chloride supplementation.  Increase the frequency of Xopenex and Atrovent nebulizations. Would also increase the dose of Xopenex for hopefully more effectiveness.   LOS: 1 day   [name] 01/14/2011, 7:43 AM

## 2011-01-15 LAB — CBC
MCH: 30.9 pg (ref 26.0–34.0)
MCHC: 33.7 g/dL (ref 30.0–36.0)
Platelets: 198 10*3/uL (ref 150–400)
RDW: 12.8 % (ref 11.5–15.5)

## 2011-01-15 LAB — GLUCOSE, CAPILLARY
Glucose-Capillary: 177 mg/dL — ABNORMAL HIGH (ref 70–99)
Glucose-Capillary: 167 mg/dL — ABNORMAL HIGH (ref 70–99)
Glucose-Capillary: 218 mg/dL — ABNORMAL HIGH (ref 70–99)

## 2011-01-15 LAB — COMPREHENSIVE METABOLIC PANEL
ALT: 16 U/L (ref 0–53)
AST: 25 U/L (ref 0–37)
Alkaline Phosphatase: 58 U/L (ref 39–117)
CO2: 29 mEq/L (ref 19–32)
Chloride: 106 mEq/L (ref 96–112)
Creatinine, Ser: 0.74 mg/dL (ref 0.50–1.35)
GFR calc non Af Amer: >90 mL/min (ref >90)
Potassium: 4.5 mEq/L (ref 3.5–5.1)
Total Bilirubin: 0.1 mg/dL — ABNORMAL LOW (ref 0.3–1.2)

## 2011-01-15 MED ORDER — FLUTICASONE-SALMETEROL 500-50 MCG/DOSE IN AEPB
INHALATION_SPRAY | RESPIRATORY_TRACT | Status: AC
  Filled 2011-01-15: qty 14

## 2011-01-15 MED ORDER — IPRATROPIUM BROMIDE 0.02 % IN SOLN
0.5000 mg | Freq: Four times a day (QID) | RESPIRATORY_TRACT | Status: DC
  Administered 2011-01-15 – 2011-01-18 (×13): 0.5 mg via RESPIRATORY_TRACT
  Filled 2011-01-15 (×13): qty 2.5

## 2011-01-15 MED ORDER — LEVALBUTEROL HCL 1.25 MG/0.5ML IN NEBU
1.2500 mg | INHALATION_SOLUTION | Freq: Four times a day (QID) | RESPIRATORY_TRACT | Status: DC
  Administered 2011-01-15 – 2011-01-18 (×13): 1.25 mg via RESPIRATORY_TRACT
  Filled 2011-01-15 (×13): qty 0.5

## 2011-01-15 MED ORDER — FLUTICASONE-SALMETEROL 500-50 MCG/DOSE IN AEPB
1.0000 | INHALATION_SPRAY | Freq: Two times a day (BID) | RESPIRATORY_TRACT | Status: DC
  Administered 2011-01-15 – 2011-01-18 (×6): 1 via RESPIRATORY_TRACT
  Filled 2011-01-15: qty 14

## 2011-01-15 NOTE — Progress Notes (Signed)
Subjective: The patient says that he feels about the same. He is coughing but is unable to expectorate.  Objective: Vital signs in last 24 hours: Filed Vitals:   01/15/11 0600 01/15/11 0700 01/15/11 0800 01/15/11 0900  BP: 114/75 100/52 122/76 123/64  Pulse: 94 87 99 95  Temp:   97.7 F (36.5 C)   TempSrc:   Oral   Resp: 25 18 20 19   Height:      Weight:      SpO2: 94% 95% 91% 93%    Intake/Output Summary (Last 24 hours) at 01/15/11 0935 Last data filed at 01/15/11 0800  Gross per 24 hour  Intake 2479.17 ml  Output   2300 ml  Net 179.17 ml    Weight change: -2.575 kg (-5 lb 10.8 oz)  Physical exam: Lungs: Bilateral fine wheezes, less than yesterday. Heart: S1, S2, with no murmurs rubs or gallops. Abdomen: Positive bowel sounds, soft, nontender, nondistended. Extremities: No pedal edema. Neurologic: He is alert and oriented x2. Cranial nerves II through XII are intact.  Lab Results: Basic Metabolic Panel:  Basename 01/15/11 0400 01/14/11 0415  NA 140 135  K 4.5 3.6  CL 106 97  CO2 29 30  GLUCOSE 154* 155*  BUN 15 10  CREATININE 0.74 0.85  CALCIUM 9.4 9.5  MG -- --  PHOS -- --   Liver Function Tests:  Basename 01/15/11 0400  AST 25  ALT 16  ALKPHOS 58  BILITOT 0.1*  PROT 6.1  ALBUMIN 3.2*   No results found for this basename: LIPASE:2,AMYLASE:2 in the last 72 hours No results found for this basename: AMMONIA:2 in the last 72 hours CBC:  Basename 01/15/11 0400 01/14/11 0415  WBC 10.3 4.4  NEUTROABS -- --  HGB 12.1* 12.4*  HCT 35.9* 37.3*  MCV 91.6 91.4  PLT 198 194   Cardiac Enzymes:  Basename 01/14/11 2027 01/14/11 1212 01/14/11 0415  CKTOTAL 1270* 1345* 1624*  CKMB 11.3* 11.6* 12.4*  CKMBINDEX -- -- --  TROPONINI <0.30 <0.30 <0.30   BNP:  Basename 01/13/11 2104  PROBNP 21.2   D-Dimer: No results found for this basename: DDIMER:2 in the last 72 hours CBG:  Basename 01/15/11 0730 01/14/11 2118 01/14/11 1635 01/14/11 1130 01/14/11  0755  GLUCAP 158* 165* 135* 131* 153*   Hemoglobin A1C: No results found for this basename: HGBA1C in the last 72 hours Fasting Lipid Panel: No results found for this basename: CHOL,HDL,LDLCALC,TRIG,CHOLHDL,LDLDIRECT in the last 72 hours Thyroid Function Tests: No results found for this basename: TSH,T4TOTAL,FREET4,T3FREE,THYROIDAB in the last 72 hours Anemia Panel: No results found for this basename: VITAMINB12,FOLATE,FERRITIN,TIBC,IRON,RETICCTPCT in the last 72 hours Coagulation: No results found for this basename: LABPROT:2,INR:2 in the last 72 hours Urine Drug Screen: Drugs of Abuse     Component Value Date/Time   LABOPIA NONE DETECTED 01/14/2011 0517   COCAINSCRNUR NONE DETECTED 01/14/2011 0517   LABBENZ NONE DETECTED 01/14/2011 0517   AMPHETMU NONE DETECTED 01/14/2011 0517   THCU NONE DETECTED 01/14/2011 0517   LABBARB NONE DETECTED 01/14/2011 0517    Alcohol Level: No results found for this basename: ETH:2 in the last 72 hours Urinalysis:   Micro: No results found for this or any previous visit (from the past 240 hour(s)).  Studies/Results: Dg Chest Port 1 View  01/13/2011  *RADIOLOGY REPORT*  Clinical Data: Tachycardia.  Shortness of breath.  PORTABLE CHEST - 1 VIEW 01/13/2011 2115 hours:  Comparison: Two-view chest x-ray and CTA chest 11/05/2010, two-view chest x-ray 07/20/2010 and  03/23/2009 [facility].  Findings: Hyperinflation and emphysematous changes throughout both lungs, unchanged.  Lungs clear.  Stable moderate central peribronchial thickening.  Cardiomediastinal silhouette unremarkable and unchanged.  No pleural effusions.  IMPRESSION: Stable COPD/emphysema.  No acute cardiopulmonary disease.  Original Report Authenticated By: Arnell Sieving, M.D.    Medications: I have reviewed the patient's current medications.  Assessment: Principal Problem:  *Acute and chronic respiratory failure (acute-on-chronic) Active Problems:  CHRONIC OBSTRUCTIVE PULMONARY  DISEASE, ACUTE EXACERBATION  Tachycardia  Hyponatremia  Hypokalemia  History of tobacco abuse  History of substance abuse  Anemia  Drug-induced hyperglycemia  1. Emphysema/COPD with acute exacerbation/acute on chronic respiratory failure. He is currently being treated with bronchodilators, Solu-Medrol, Avelox, oxygen, and supportive treatment. He had slightly fewer bronchospasms  Steroid-induced hyperglycemia. Treating with sliding scale NovoLog.  Elevated CK and normal troponin I. Question mild rhabdomyolysis.  Mild anemia.  Sinus tachycardia. Now resolved.  Hyponatremia, now resolved.  Hypokalemia, resolved with repletion.  History of tobacco and marijuana use. His urine drug screen is negative. The patient says that he stopped smoking marijuana and cigarettes months ago.   Plan:  Continue current management. We'll discontinue potassium chloride supplementation for now. We'll transfer to telemetry, out of the step down unit.   LOS: 2 days   Victorious Kundinger 01/15/2011, 9:35 AM

## 2011-01-16 ENCOUNTER — Inpatient Hospital Stay (HOSPITAL_COMMUNITY): Payer: Medicare Other

## 2011-01-16 LAB — GLUCOSE, CAPILLARY
Glucose-Capillary: 106 mg/dL — ABNORMAL HIGH (ref 70–99)
Glucose-Capillary: 118 mg/dL — ABNORMAL HIGH (ref 70–99)

## 2011-01-16 LAB — BASIC METABOLIC PANEL
Chloride: 109 mEq/L (ref 96–112)
GFR calc Af Amer: >90 mL/min (ref >90)
GFR calc non Af Amer: >90 mL/min (ref >90)
Potassium: 4.8 mEq/L (ref 3.5–5.1)

## 2011-01-16 MED ORDER — MAGNESIUM SULFATE 40 MG/ML IJ SOLN
2.0000 g | Freq: Once | INTRAMUSCULAR | Status: AC
  Administered 2011-01-16: 2 g via INTRAVENOUS
  Filled 2011-01-16: qty 50

## 2011-01-16 MED ORDER — DEXTROSE 5 % IV SOLN: 2.0000 g | Freq: Once | INTRAVENOUS | Status: DC

## 2011-01-16 NOTE — Progress Notes (Signed)
UR Chart Review Completed  

## 2011-01-16 NOTE — Progress Notes (Signed)
Subjective: The patient says that he is breathing a little bit better but still has some chest congestion.  Objective: Vital signs in last 24 hours: Filed Vitals:   01/16/11 0600 01/16/11 0700 01/16/11 0800 01/16/11 0905  BP: 121/70  125/70   Pulse: 88 88 97   Temp:   98 F (36.7 C)   TempSrc:   Oral   Resp: 20 18 21    Height:      Weight:      SpO2: 94% 98% 95% 96%    Intake/Output Summary (Last 24 hours) at 01/16/11 0940 Last data filed at 01/16/11 0800  Gross per 24 hour  Intake   3030 ml  Output   1650 ml  Net   1380 ml    Weight change: 1.5 kg (3 lb 4.9 oz)  Physical exam: Lungs: Bilateral fine wheezes, about the same as yesterday. Heart: S1, S2, with no murmurs rubs or gallops. Abdomen: Positive bowel sounds, soft, nontender, nondistended. Extremities: No pedal edema. Neurologic: He is alert and oriented x2. Cranial nerves II through XII are intact.  Lab Results: Basic Metabolic Panel:  Basename 01/16/11 0428 01/15/11 0400  NA 142 140  K 4.8 4.5  CL 109 106  CO2 29 29  GLUCOSE 131* 154*  BUN 14 15  CREATININE 0.72 0.74  CALCIUM 9.2 9.4  MG -- --  PHOS -- --   Liver Function Tests:  Basename 01/15/11 0400  AST 25  ALT 16  ALKPHOS 58  BILITOT 0.1*  PROT 6.1  ALBUMIN 3.2*   No results found for this basename: LIPASE:2,AMYLASE:2 in the last 72 hours No results found for this basename: AMMONIA:2 in the last 72 hours CBC:  Basename 01/15/11 0400 01/14/11 0415  WBC 10.3 4.4  NEUTROABS -- --  HGB 12.1* 12.4*  HCT 35.9* 37.3*  MCV 91.6 91.4  PLT 198 194   Cardiac Enzymes:  Basename 01/14/11 2027 01/14/11 1212 01/14/11 0415  CKTOTAL 1270* 1345* 1624*  CKMB 11.3* 11.6* 12.4*  CKMBINDEX -- -- --  TROPONINI <0.30 <0.30 <0.30   BNP:  Basename 01/13/11 2104  PROBNP 21.2   D-Dimer: No results found for this basename: DDIMER:2 in the last 72 hours CBG:  Basename 01/16/11 0720 01/15/11 2056 01/15/11 1635 01/15/11 1206 01/15/11 0730 01/14/11  2118  GLUCAP 128* 218* 167* 177* 158* 165*   Hemoglobin A1C: No results found for this basename: HGBA1C in the last 72 hours Fasting Lipid Panel: No results found for this basename: CHOL,HDL,LDLCALC,TRIG,CHOLHDL,LDLDIRECT in the last 72 hours Thyroid Function Tests: No results found for this basename: TSH,T4TOTAL,FREET4,T3FREE,THYROIDAB in the last 72 hours Anemia Panel: No results found for this basename: VITAMINB12,FOLATE,FERRITIN,TIBC,IRON,RETICCTPCT in the last 72 hours Coagulation: No results found for this basename: LABPROT:2,INR:2 in the last 72 hours Urine Drug Screen: Drugs of Abuse     Component Value Date/Time   LABOPIA NONE DETECTED 01/14/2011 0517   COCAINSCRNUR NONE DETECTED 01/14/2011 0517   LABBENZ NONE DETECTED 01/14/2011 0517   AMPHETMU NONE DETECTED 01/14/2011 0517   THCU NONE DETECTED 01/14/2011 0517   LABBARB NONE DETECTED 01/14/2011 0517    Alcohol Level: No results found for this basename: ETH:2 in the last 72 hours Urinalysis:   Micro: Recent Results (from the past 240 hour(s))  MRSA PCR SCREENING     Status: Normal   Collection Time   01/15/11 10:42 AM      Component Value Range Status Comment   MRSA by PCR NEGATIVE  NEGATIVE  Final  Studies/Results: No results found.  Medications: I have reviewed the patient's current medications.  Assessment: Principal Problem:  *Acute and chronic respiratory failure (acute-on-chronic) Active Problems:  CHRONIC OBSTRUCTIVE PULMONARY DISEASE, ACUTE EXACERBATION  Tachycardia  Hyponatremia  Hypokalemia  History of tobacco abuse  History of substance abuse  Anemia  Drug-induced hyperglycemia  1. Emphysema/COPD with acute exacerbation/acute on chronic respiratory failure. He is currently being treated with bronchodilators, Solu-Medrol, Avelox, oxygen, and supportive treatment. Slow resolution.  Steroid-induced hyperglycemia. Treating with sliding scale NovoLog.  Elevated CK and normal troponin I. Question mild  rhabdomyolysis.  Mild anemia.  Sinus tachycardia. Now resolved.  Hyponatremia, now resolved.  Hypokalemia, resolved with repletion.  History of tobacco and marijuana use. His urine drug screen is negative. The patient says that he stopped smoking marijuana and cigarettes months ago.   Plan:  Continue current management.   LOS: 3 days   [name] 01/16/2011, 9:40 AM

## 2011-01-16 NOTE — Progress Notes (Signed)
Placed pt's 2000 med in nebulizer and scanned then realized another rt did his tx.

## 2011-01-17 LAB — GLUCOSE, CAPILLARY
Glucose-Capillary: 155 mg/dL — ABNORMAL HIGH (ref 70–99)
Glucose-Capillary: 185 mg/dL — ABNORMAL HIGH (ref 70–99)

## 2011-01-17 MED ORDER — INSULIN GLARGINE 100 UNIT/ML ~~LOC~~ SOLN
7.0000 [IU] | Freq: Every day | SUBCUTANEOUS | Status: DC
  Administered 2011-01-17: 7 [IU] via SUBCUTANEOUS
  Filled 2011-01-17: qty 3

## 2011-01-17 NOTE — Progress Notes (Signed)
Subjective: The patient became more short of breath overnight following ambulation. He developed more bronchospasms. He was given 2 g of magnesium sulfate. He says that he believes the magnesium help. Today, he feels much less congested.  Objective: Vital signs in last 24 hours: Filed Vitals:   01/17/11 0730 01/17/11 1133 01/17/11 1500 01/17/11 1534  BP:   116/64   Pulse:   87   Temp:   98 F (36.7 C)   TempSrc:   Oral   Resp:   20   Height:      Weight:      SpO2: 95% 97% 93% 97%    Intake/Output Summary (Last 24 hours) at 01/17/11 1620 Last data filed at 01/17/11 1500  Gross per 24 hour  Intake 1151.83 ml  Output   2400 ml  Net -1248.17 ml    Weight change: 5.1 kg (11 lb 3.9 oz)  Physical exam: Lungs: Bilateral fine wheezes, slightly less than yesterday. Heart: S1, S2, with no murmurs rubs or gallops. Abdomen: Positive bowel sounds, soft, nontender, nondistended. Extremities: No pedal edema. Neurologic: He is alert and oriented x2. Cranial nerves II through XII are intact.  Lab Results: Basic Metabolic Panel:  Basename 01/16/11 0428 01/15/11 0400  NA 142 140  K 4.8 4.5  CL 109 106  CO2 29 29  GLUCOSE 131* 154*  BUN 14 15  CREATININE 0.72 0.74  CALCIUM 9.2 9.4  MG -- --  PHOS -- --   Liver Function Tests:  Basename 01/15/11 0400  AST 25  ALT 16  ALKPHOS 58  BILITOT 0.1*  PROT 6.1  ALBUMIN 3.2*   No results found for this basename: LIPASE:2,AMYLASE:2 in the last 72 hours No results found for this basename: AMMONIA:2 in the last 72 hours CBC:  Basename 01/15/11 0400  WBC 10.3  NEUTROABS --  HGB 12.1*  HCT 35.9*  MCV 91.6  PLT 198   Cardiac Enzymes:  Basename 01/14/11 2027  CKTOTAL 1270*  CKMB 11.3*  CKMBINDEX --  TROPONINI <0.30   BNP: No results found for this basename: PROBNP:3 in the last 72 hours D-Dimer: No results found for this basename: DDIMER:2 in the last 72 hours CBG:  Basename 01/17/11 1109 01/17/11 0714 01/16/11 2122  01/16/11 1618 01/16/11 1143 01/16/11 0720  GLUCAP 160* 129* 118* 157* 106* 128*   Hemoglobin A1C: No results found for this basename: HGBA1C in the last 72 hours Fasting Lipid Panel: No results found for this basename: CHOL,HDL,LDLCALC,TRIG,CHOLHDL,LDLDIRECT in the last 72 hours Thyroid Function Tests: No results found for this basename: TSH,T4TOTAL,FREET4,T3FREE,THYROIDAB in the last 72 hours Anemia Panel: No results found for this basename: VITAMINB12,FOLATE,FERRITIN,TIBC,IRON,RETICCTPCT in the last 72 hours Coagulation: No results found for this basename: LABPROT:2,INR:2 in the last 72 hours Urine Drug Screen: Drugs of Abuse     Component Value Date/Time   LABOPIA NONE DETECTED 01/14/2011 0517   COCAINSCRNUR NONE DETECTED 01/14/2011 0517   LABBENZ NONE DETECTED 01/14/2011 0517   AMPHETMU NONE DETECTED 01/14/2011 0517   THCU NONE DETECTED 01/14/2011 0517   LABBARB NONE DETECTED 01/14/2011 0517    Alcohol Level: No results found for this basename: ETH:2 in the last 72 hours Urinalysis:   Micro: Recent Results (from the past 240 hour(s))  MRSA PCR SCREENING     Status: Normal   Collection Time   01/15/11 10:42 AM      Component Value Range Status Comment   MRSA by PCR NEGATIVE  NEGATIVE  Final     Studies/Results:  Dg Chest Port 1 View  01/16/2011  *RADIOLOGY REPORT*  Clinical Data: Shortness of breath.  PORTABLE CHEST - 1 VIEW  Comparison: Portal chest 01/13/2011.  Findings: The heart size is normal.  Emphysematous changes are again noted. There is slight increase in mild pulmonary vascular congestion.  There is no frank edema or effusion.  IMPRESSION:  1.  Emphysema. 2.  Slight increase in mild pulmonary vascular congestion since the prior exam.  Original Report Authenticated By: Jamesetta Orleans. MATTERN, M.D.    Medications: I have reviewed the patient's current medications.  Assessment: Principal Problem:  *Acute and chronic respiratory failure (acute-on-chronic) Active Problems:   CHRONIC OBSTRUCTIVE PULMONARY DISEASE, ACUTE EXACERBATION  Tachycardia  Hyponatremia  Hypokalemia  History of tobacco abuse  History of substance abuse  Anemia  Drug-induced hyperglycemia  1. Emphysema/COPD with acute exacerbation/acute on chronic respiratory failure. He is currently being treated with bronchodilators, Solu-Medrol, Avelox, oxygen, and supportive treatment. He was given 2 g of magnesium sulfate IV yesterday. Slow resolution.  Steroid-induced hyperglycemia. Treating with sliding scale NovoLog.  Elevated CK and normal troponin I. Question mild rhabdomyolysis.  Mild anemia.  Sinus tachycardia. Now resolved.  Hyponatremia, now resolved.  Hypokalemia, resolved with repletion.  History of tobacco and marijuana use. His urine drug screen is negative. The patient says that he stopped smoking marijuana and cigarettes months ago.   Plan:  Decrease Solu-Medrol frequency. Decrease Lantus. Will check his oxygen saturations off of supplemental oxygen. Possible discharge tomorrow or the next day.   LOS: 4 days   Nitish Roes 01/17/2011, 4:20 PM

## 2011-01-18 MED ORDER — PREDNISONE 10 MG PO TABS: ORAL_TABLET | ORAL | Status: DC

## 2011-01-18 MED ORDER — ALBUTEROL SULFATE (2.5 MG/3ML) 0.083% IN NEBU: 2.5000 mg | INHALATION_SOLUTION | Freq: Three times a day (TID) | RESPIRATORY_TRACT | Status: DC

## 2011-01-18 MED ORDER — IPRATROPIUM-ALBUTEROL 18-103 MCG/ACT IN AERO
2.0000 | INHALATION_SPRAY | Freq: Four times a day (QID) | RESPIRATORY_TRACT | Status: DC
  Administered 2011-01-18: 2 via RESPIRATORY_TRACT
  Filled 2011-01-18: qty 14.7

## 2011-01-18 MED ORDER — MOXIFLOXACIN HCL 400 MG PO TABS
400.0000 mg | ORAL_TABLET | Freq: Every day | ORAL | Status: DC
  Administered 2011-01-18: 400 mg via ORAL
  Filled 2011-01-18: qty 1

## 2011-01-18 MED ORDER — PREDNISONE 20 MG PO TABS
60.0000 mg | ORAL_TABLET | Freq: Every day | ORAL | Status: DC
  Administered 2011-01-18: 60 mg via ORAL
  Filled 2011-01-18: qty 3

## 2011-01-18 MED ORDER — IPRATROPIUM BROMIDE 0.02 % IN SOLN: 500.0000 ug | Freq: Three times a day (TID) | RESPIRATORY_TRACT | Status: DC

## 2011-01-18 MED ORDER — IPRATROPIUM-ALBUTEROL 18-103 MCG/ACT IN AERO: 2.0000 | INHALATION_SPRAY | RESPIRATORY_TRACT | Status: DC | PRN

## 2011-01-18 NOTE — Discharge Summary (Signed)
Physician Discharge Summary  [name] [name]: [id number] DOB/AGE: 63-Dec-1952 63 y.o.  PCP: Syliva Overman, MD, MD   Admit date: 01/13/2011 Discharge date: 01/18/2011  Discharge Diagnoses:  1. Emphysema/COPD with acute bronchitic exacerbation. 2. Acute hypoxic respiratory failure secondary to COPD exacerbation. The patient's oxygen saturations were in the low 90s on room air prior to discharge. 3. Hyponatremia. Resolved with IV fluids. 4. Hypokalemia. Resolved with supplementation. 5. Steroid-induced hyperglycemia. 6. Mild chronic normocytic anemia. Outpatient evaluation per primary care physician. 7. Tachycardia secondary to respiratory distress and bronchodilators. Resolved. 8. History of tobacco and substance abuse, now abstinent. His urine drug screen was negative.    Current Discharge Medication List    START taking these medications   Details  albuterol-ipratropium (COMBIVENT) 18-103 MCG/ACT inhaler Inhale 2 puffs into the lungs every 4 (four) hours as needed for wheezing or shortness of breath.    predniSONE (DELTASONE) 10 MG tablet STARTING TOMORROW; TAKE 6 TABLETS FOR ONE DAY; THE 5 TABLETS THE NEXT DAY; THEN 4 TABLETS THE NEXT DAY; THEN 3 TABLETS THE NEXT DAY; THEN 2 TABLETS THE NEXT DAY; THEN 1 TABLET THE NEXT DAY; THEN STOP. Qty: 21 tablet, Refills: 0      CONTINUE these medications which have CHANGED   Details  albuterol (PROVENTIL) (2.5 MG/3ML) 0.083% nebulizer solution Take 3 mLs (2.5 mg total) by nebulization 3 (three) times daily. Use one vial every 6 to 8 hours as needed for wheezing Qty: 75 mL, Refills: 5    ipratropium (ATROVENT) 0.02 % nebulizer solution Take 2.5 mLs (500 mcg total) by nebulization 3 (three) times daily. Qty: 75 mL, Refills: 5      CONTINUE these medications which have NOT CHANGED   Details  albuterol (PROVENTIL HFA;VENTOLIN HFA) 108 (90 BASE) MCG/ACT inhaler Inhale 2 puffs into the lungs every 4 (four) hours as needed for  wheezing. Qty: 1 Inhaler, Refills: 5    Fluticasone-Salmeterol (ADVAIR DISKUS) 500-50 MCG/DOSE AEPB Inhale 1 puff into the lungs 2 (two) times daily. Qty: 60 each, Refills: 11   Associated Diagnoses: Asthma with COPD    simvastatin (ZOCOR) 40 MG tablet Take 1 tablet (40 mg total) by mouth at bedtime. Qty: 30 tablet, Refills: 5    triamterene-hydrochlorothiazide (MAXZIDE-25) 37.5-25 MG per tablet TAKE ONE TABLET BY MOUTH ONCE DAILY. Qty: 30 tablet, Refills: 4    clotrimazole-betamethasone (LOTRISONE) cream Apply to affected area 2 times daily Qty: 45 g, Refills: 0        Discharge Condition: Improved and stable.  Disposition: Home or Self Care   Consults: None.   Significant Diagnostic Studies: Dg Chest Port 1 View  01/16/2011  *RADIOLOGY REPORT*  Clinical Data: Shortness of breath.  PORTABLE CHEST - 1 VIEW  Comparison: Portal chest 01/13/2011.  Findings: The heart size is normal.  Emphysematous changes are again noted. There is slight increase in mild pulmonary vascular congestion.  There is no frank edema or effusion.  IMPRESSION:  1.  Emphysema. 2.  Slight increase in mild pulmonary vascular congestion since the prior exam.  Original Report Authenticated By: Jamesetta Orleans. MATTERN, M.D.   Dg Chest Port 1 View  01/13/2011  *RADIOLOGY REPORT*  Clinical Data: Tachycardia.  Shortness of breath.  PORTABLE CHEST - 1 VIEW 01/13/2011 2115 hours:  Comparison: Two-view chest x-ray and CTA chest 11/05/2010, two-view chest x-ray 07/20/2010 and 03/23/2009 [facility].  Findings: Hyperinflation and emphysematous changes throughout both lungs, unchanged.  Lungs clear.  Stable moderate central peribronchial thickening.  Cardiomediastinal silhouette unremarkable and unchanged.  No pleural effusions.  IMPRESSION: Stable COPD/emphysema.  No acute cardiopulmonary disease.  Original Report Authenticated By: Arnell Sieving, M.D.    Microbiology: Recent Results (from the past 240 hour(s))    MRSA PCR SCREENING     Status: Normal   Collection Time   01/15/11 10:42 AM      Component Value Range Status Comment   MRSA by PCR NEGATIVE  NEGATIVE  Final      Labs: Results for orders placed during the hospital encounter of 01/13/11 (from the past 48 hour(s))  GLUCOSE, CAPILLARY     Status: Abnormal   Collection Time   01/16/11  4:18 PM      Component Value Range Comment   Glucose-Capillary 157 (*) 70 - 99 (mg/dL)    Comment 1 Notify RN      Comment 2 Documented in Chart     GLUCOSE, CAPILLARY     Status: Abnormal   Collection Time   01/16/11  9:22 PM      Component Value Range Comment   Glucose-Capillary 118 (*) 70 - 99 (mg/dL)    Comment 1 Notify RN     GLUCOSE, CAPILLARY     Status: Abnormal   Collection Time   01/17/11  7:14 AM      Component Value Range Comment   Glucose-Capillary 129 (*) 70 - 99 (mg/dL)   GLUCOSE, CAPILLARY     Status: Abnormal   Collection Time   01/17/11 11:09 AM      Component Value Range Comment   Glucose-Capillary 160 (*) 70 - 99 (mg/dL)   GLUCOSE, CAPILLARY     Status: Abnormal   Collection Time   01/17/11  4:47 PM      Component Value Range Comment   Glucose-Capillary 155 (*) 70 - 99 (mg/dL)    Comment 1 Notify RN      Comment 2 Documented in Chart     GLUCOSE, CAPILLARY     Status: Abnormal   Collection Time   01/17/11  9:08 PM      Component Value Range Comment   Glucose-Capillary 185 (*) 70 - 99 (mg/dL)    Comment 1 Notify RN     GLUCOSE, CAPILLARY     Status: Abnormal   Collection Time   01/18/11  7:14 AM      Component Value Range Comment   Glucose-Capillary 137 (*) 70 - 99 (mg/dL)   GLUCOSE, CAPILLARY     Status: Abnormal   Collection Time   01/18/11 11:24 AM      Component Value Range Comment   Glucose-Capillary 171 (*) 70 - 99 (mg/dL)      HPI : The patient is a 61 year old man with a past medical history significant for emphysema and hypertension, who presented to the emergency department on 01/13/2011 with a chief complaint  of shortness of breath and wheezing. In the emergency department, his oxygen saturations were in the 80s. He was treated immediately with oxygen, bronchodilators, and BiPAP. He was given Solu-Medrol and IV antibiotics as well. His chest x-ray revealed stable COPD/emphysema. His lab data were significant for a serum sodium of 134, potassium of 3.1, and a normal WBC of 5.8. He was admitted for further evaluation and management.  HOSPITAL COURSE: The patient was admitted to the step down unit. BiPAP was eventually discontinued as his pulmonary status improved in the ED following initial therapy. He was continued on bronchodilator therapy with Xopenex and Atrovent every  4 hours. Antibiotic treatment was started with Avelox intravenously. Steroid therapy was started with Solu-Medrol intravenously. Gentle IV fluids were given for volume depletion and treatment of hyponatremia. He was supplemented with potassium chloride orally and in his IV fluids. Oxygen was applied and titrated to keep his oxygen saturations greater than 92%. His capillary blood glucose/venous glucose became elevated. This was thought to be secondary to steroid therapy. Therefore, Lantus and sliding scale NovoLog were initiated.  Over the course of the hospitalization, there was slow resolution of his bronchospasms and shortness of breath. He was eventually given 2 g of magnesium sulfate to see if it would decrease the extent of his bronchospasms. Following the magnesium sulfate and ongoing therapy, the extent of his wheezing subsided. At the time of hospital discharge, he was nearly bronchospasm free.  He ambulated in the hallway. His oxygen saturations ranged from 90-93%. He was discharged to home in improved and stable condition. He remained hemodynamically stable. He remained afebrile. A followup chest x-ray revealed mild vascular congestion.   Discharge Exam: Blood pressure 132/84, pulse 78, temperature 97.6 F (36.4 C), temperature  source Oral, resp. rate 20, height 5\' 7"  (1.702 m), weight 63.9 kg (140 lb 14 oz), SpO2 93.00%.   Lungs: Occasional wheezes. Breathing nonlabored. Heart: S1, S2, with no murmurs rubs or gallops. Abdomen: Positive bowel sounds, soft, nontender, nondistended. Extremities: No pedal edema.    Discharge Orders    Future Appointments: Provider: Department: Dept Phone: Center:   01/24/2011 8:30 AM Syliva Overman, MD [facility] [phone] [facility]   03/22/2011 9:30 AM Syliva Overman, MD [facility] [phone] [facility]     Future Orders Please Complete By Expires   Diet - low sodium heart healthy      Increase activity slowly      Discharge instructions      Comments:   TAKE MEDICATIONS AS PRESCRIBED.      Follow-up Information    Follow up with Syliva Overman, MD .         Discharge time: 40 minutes.  Signed: Wafa Martes 01/18/2011, 12:37 PM

## 2011-01-18 NOTE — Progress Notes (Signed)
Patients O2 saturations this AM on RA: 91% after taking O2 off for 20 minutes. Ambulated patient with NT supervision in hall. Patient c/o slight SOB but O2 sats 92% after ambulation.

## 2011-01-22 ENCOUNTER — Encounter: Payer: Self-pay | Admitting: Family Medicine

## 2011-01-24 ENCOUNTER — Encounter: Payer: Self-pay | Admitting: Family Medicine

## 2011-01-24 ENCOUNTER — Ambulatory Visit (INDEPENDENT_AMBULATORY_CARE_PROVIDER_SITE_OTHER): Payer: Medicare Other | Admitting: Family Medicine

## 2011-01-24 VITALS — BP 140/84 | HR 96 | Resp 16 | Ht 67.0 in | Wt 134.4 lb

## 2011-01-24 DIAGNOSIS — J449 Chronic obstructive pulmonary disease, unspecified: Secondary | ICD-10-CM

## 2011-01-24 DIAGNOSIS — E785 Hyperlipidemia, unspecified: Secondary | ICD-10-CM

## 2011-01-24 DIAGNOSIS — R7309 Other abnormal glucose: Secondary | ICD-10-CM

## 2011-01-24 DIAGNOSIS — J45909 Unspecified asthma, uncomplicated: Secondary | ICD-10-CM

## 2011-01-24 DIAGNOSIS — I10 Essential (primary) hypertension: Secondary | ICD-10-CM

## 2011-01-24 DIAGNOSIS — R7301 Impaired fasting glucose: Secondary | ICD-10-CM

## 2011-01-24 NOTE — Patient Instructions (Signed)
F/U in 2 months.Keep March 15 appt  Pls take all medication as prescribed   hBA1C , chem 7, lipid and hepatic fasting just before next visit in March  You will be referred to case management

## 2011-01-28 NOTE — Assessment & Plan Note (Signed)
Continue med, low fat diet discussed and encouraged, rept labs before next visit

## 2011-01-28 NOTE — Assessment & Plan Note (Signed)
Counseled re the need to follow low carb diet, steroid use increases DM risk

## 2011-01-28 NOTE — Assessment & Plan Note (Signed)
Recently hospitalized with exaccerbation, now better

## 2011-01-28 NOTE — Assessment & Plan Note (Signed)
Controlled, no change in medication  

## 2011-01-28 NOTE — Progress Notes (Signed)
  Subjective:    Patient ID: [name], male    DOB: Oct 09, 1950, 61 y.o.   [name]: [id number]  HPI Pt hospitalized recently for respiratory failure due to asthma /copd exaccerbation, here for f/u. Denies current wheeze or dyspnea, no cough or fever or sputum. Continues to struggl with poor social support interested in any help he can get   Review of Systems See HPI Denies recent fever or chills. Denies sinus pressure, nasal congestion, ear pain or sore throat. Denies chest congestion, productive cough or wheezing. Denies chest pains, palpitations and leg swelling Denies abdominal pain, nausea, vomiting,diarrhea or constipation.   Denies dysuria, frequency, hesitancy or incontinence. Denies joint pain, swelling and limitation in mobility. Denies headaches, seizures, numbness, or tingling. Denies depression, anxiety or insomnia. Denies skin break down or rash.        Objective:   Physical Exam Patient alert and oriented and in no cardiopulmonary distress.  HEENT: No facial asymmetry, EOMI, no sinus tenderness,  oropharynx pink and moist.  Neck supple no adenopathy.  Chest: Decreased air entry, few wheezes, no crackles   CVS: S1, S2 no murmurs, no S3.  ABD: Soft non tender. Bowel sounds normal.  Ext: No edema  MS: Adequate ROM spine, shoulders, hips and knees.  Skin: Intact, no ulcerations or rash noted.  Psych: Good eye contact, normal affect. Memory intact not anxious or depressed appearing.  CNS: CN 2-12 intact, power, tone and sensation normal throughout.        Assessment & Plan:

## 2011-02-17 ENCOUNTER — Emergency Department (HOSPITAL_COMMUNITY): Payer: Medicare Other

## 2011-02-17 ENCOUNTER — Inpatient Hospital Stay (HOSPITAL_COMMUNITY)
Admission: EM | Admit: 2011-02-17 | Discharge: 2011-02-23 | DRG: 208 | Disposition: A | Discharge status: Home / Self Care | Payer: Medicare Other | Attending: Internal Medicine | Admitting: Internal Medicine

## 2011-02-17 ENCOUNTER — Other Ambulatory Visit: Payer: Self-pay

## 2011-02-17 ENCOUNTER — Encounter (HOSPITAL_COMMUNITY): Payer: Self-pay | Admitting: Emergency Medicine

## 2011-02-17 DIAGNOSIS — R112 Nausea with vomiting, unspecified: Secondary | ICD-10-CM

## 2011-02-17 DIAGNOSIS — D649 Anemia, unspecified: Secondary | ICD-10-CM

## 2011-02-17 DIAGNOSIS — E559 Vitamin D deficiency, unspecified: Secondary | ICD-10-CM

## 2011-02-17 DIAGNOSIS — M62838 Other muscle spasm: Secondary | ICD-10-CM

## 2011-02-17 DIAGNOSIS — J4489 Other specified chronic obstructive pulmonary disease: Secondary | ICD-10-CM

## 2011-02-17 DIAGNOSIS — J9690 Respiratory failure, unspecified, unspecified whether with hypoxia or hypercapnia: Secondary | ICD-10-CM

## 2011-02-17 DIAGNOSIS — R000 Tachycardia, unspecified: Secondary | ICD-10-CM

## 2011-02-17 DIAGNOSIS — J441 Chronic obstructive pulmonary disease with (acute) exacerbation: Principal | ICD-10-CM

## 2011-02-17 DIAGNOSIS — Z87891 Personal history of nicotine dependence: Secondary | ICD-10-CM

## 2011-02-17 DIAGNOSIS — I10 Essential (primary) hypertension: Secondary | ICD-10-CM

## 2011-02-17 DIAGNOSIS — F1911 Other psychoactive substance abuse, in remission: Secondary | ICD-10-CM

## 2011-02-17 DIAGNOSIS — IMO0002 Reserved for concepts with insufficient information to code with codable children: Secondary | ICD-10-CM

## 2011-02-17 DIAGNOSIS — R739 Hyperglycemia, unspecified: Secondary | ICD-10-CM

## 2011-02-17 DIAGNOSIS — R7309 Other abnormal glucose: Secondary | ICD-10-CM

## 2011-02-17 DIAGNOSIS — E860 Dehydration: Secondary | ICD-10-CM

## 2011-02-17 DIAGNOSIS — Z79899 Other long term (current) drug therapy: Secondary | ICD-10-CM

## 2011-02-17 DIAGNOSIS — J449 Chronic obstructive pulmonary disease, unspecified: Secondary | ICD-10-CM

## 2011-02-17 DIAGNOSIS — D509 Iron deficiency anemia, unspecified: Secondary | ICD-10-CM | POA: Diagnosis not present

## 2011-02-17 DIAGNOSIS — E785 Hyperlipidemia, unspecified: Secondary | ICD-10-CM

## 2011-02-17 DIAGNOSIS — E871 Hypo-osmolality and hyponatremia: Secondary | ICD-10-CM

## 2011-02-17 DIAGNOSIS — R972 Elevated prostate specific antigen [PSA]: Secondary | ICD-10-CM

## 2011-02-17 DIAGNOSIS — F1210 Cannabis abuse, uncomplicated: Secondary | ICD-10-CM | POA: Diagnosis present

## 2011-02-17 DIAGNOSIS — R7301 Impaired fasting glucose: Secondary | ICD-10-CM

## 2011-02-17 DIAGNOSIS — E876 Hypokalemia: Secondary | ICD-10-CM

## 2011-02-17 DIAGNOSIS — J45909 Unspecified asthma, uncomplicated: Secondary | ICD-10-CM

## 2011-02-17 DIAGNOSIS — R9389 Abnormal findings on diagnostic imaging of other specified body structures: Secondary | ICD-10-CM

## 2011-02-17 DIAGNOSIS — J9620 Acute and chronic respiratory failure, unspecified whether with hypoxia or hypercapnia: Secondary | ICD-10-CM

## 2011-02-17 DIAGNOSIS — T50905A Adverse effect of unspecified drugs, medicaments and biological substances, initial encounter: Secondary | ICD-10-CM

## 2011-02-17 LAB — URINALYSIS, ROUTINE W REFLEX MICROSCOPIC
Glucose, UA: NEGATIVE mg/dL
Ketones, ur: NEGATIVE mg/dL
Protein, ur: NEGATIVE mg/dL
pH: 5.5 (ref 5.0–8.0)

## 2011-02-17 LAB — CARDIAC PANEL(CRET KIN+CKTOT+MB+TROPI)
CK, MB: 11.4 ng/mL (ref 0.3–4.0)
Total CK: 602 U/L — ABNORMAL HIGH (ref 7–232)

## 2011-02-17 LAB — COMPREHENSIVE METABOLIC PANEL
Alkaline Phosphatase: 69 U/L (ref 39–117)
BUN: 14 mg/dL (ref 6–23)
CO2: 25 mEq/L (ref 19–32)
Chloride: 100 mEq/L (ref 96–112)
Creatinine, Ser: 0.95 mg/dL (ref 0.50–1.35)
GFR calc non Af Amer: 87 mL/min — ABNORMAL LOW (ref >90)
Glucose, Bld: 155 mg/dL — ABNORMAL HIGH (ref 70–99)
Potassium: 4 mEq/L (ref 3.5–5.1)
Total Bilirubin: 0.2 mg/dL — ABNORMAL LOW (ref 0.3–1.2)

## 2011-02-17 LAB — RAPID URINE DRUG SCREEN, HOSP PERFORMED
Amphetamines: NOT DETECTED
Benzodiazepines: NOT DETECTED
Opiates: NOT DETECTED
Tetrahydrocannabinol: NOT DETECTED

## 2011-02-17 LAB — BLOOD GAS, ARTERIAL
Bicarbonate: 25.8 mEq/L — ABNORMAL HIGH (ref 20.0–24.0)
FIO2: 0.6 %
O2 Saturation: 99.3 %
RATE: 15 resp/min
pCO2 arterial: 62.6 mmHg (ref 35.0–45.0)
pO2, Arterial: 317 mmHg — ABNORMAL HIGH (ref 80.0–100.0)

## 2011-02-17 LAB — URINE MICROSCOPIC-ADD ON

## 2011-02-17 LAB — CBC
HCT: 40 % (ref 39.0–52.0)
Hemoglobin: 13.2 g/dL (ref 13.0–17.0)
MCHC: 33 g/dL (ref 30.0–36.0)
RBC: 4.26 MIL/uL (ref 4.22–5.81)
WBC: 8.8 10*3/uL (ref 4.0–10.5)

## 2011-02-17 LAB — DIFFERENTIAL
Lymphocytes Relative: 54 % — ABNORMAL HIGH (ref 12–46)
Lymphs Abs: 4.7 10*3/uL — ABNORMAL HIGH (ref 0.7–4.0)
Monocytes Absolute: 1 10*3/uL (ref 0.1–1.0)
Monocytes Relative: 12 % (ref 3–12)
Neutro Abs: 2.8 10*3/uL (ref 1.7–7.7)

## 2011-02-17 MED ORDER — PROPOFOL 10 MG/ML IV EMUL
INTRAVENOUS | Status: AC
  Filled 2011-02-17: qty 40

## 2011-02-17 MED ORDER — METHYLPREDNISOLONE SODIUM SUCC 125 MG IJ SOLR
125.0000 mg | Freq: Once | INTRAMUSCULAR | Status: AC
  Administered 2011-02-17: 125 mg via INTRAVENOUS

## 2011-02-17 MED ORDER — ALBUTEROL SULFATE (5 MG/ML) 0.5% IN NEBU
2.5000 mg | INHALATION_SOLUTION | RESPIRATORY_TRACT | Status: DC
  Administered 2011-02-17 – 2011-02-23 (×30): 2.5 mg via RESPIRATORY_TRACT
  Filled 2011-02-17 (×29): qty 0.5

## 2011-02-17 MED ORDER — MIDAZOLAM HCL 5 MG/5ML IJ SOLN
3.0000 mg | Freq: Once | INTRAMUSCULAR | Status: AC
  Administered 2011-02-17: 3 mg via INTRAVENOUS
  Filled 2011-02-17: qty 5

## 2011-02-17 MED ORDER — ALBUTEROL (5 MG/ML) CONTINUOUS INHALATION SOLN
INHALATION_SOLUTION | RESPIRATORY_TRACT | Status: AC
  Filled 2011-02-17: qty 20

## 2011-02-17 MED ORDER — SUCCINYLCHOLINE CHLORIDE 20 MG/ML IJ SOLN
INTRAMUSCULAR | Status: AC
  Administered 2011-02-17: 100 mg
  Filled 2011-02-17: qty 1

## 2011-02-17 MED ORDER — SODIUM CHLORIDE 0.9 % IV SOLN
Freq: Once | INTRAVENOUS | Status: AC
  Administered 2011-02-17: via INTRAVENOUS

## 2011-02-17 MED ORDER — PROPOFOL 10 MG/ML IV EMUL
INTRAVENOUS | Status: AC
  Filled 2011-02-17: qty 100

## 2011-02-17 MED ORDER — IPRATROPIUM BROMIDE 0.02 % IN SOLN
0.5000 mg | RESPIRATORY_TRACT | Status: DC
  Administered 2011-02-17 – 2011-02-23 (×30): 0.5 mg via RESPIRATORY_TRACT
  Filled 2011-02-17 (×29): qty 2.5

## 2011-02-17 MED ORDER — LIDOCAINE HCL (CARDIAC) 20 MG/ML IV SOLN
INTRAVENOUS | Status: AC
  Filled 2011-02-17: qty 5

## 2011-02-17 MED ORDER — ETOMIDATE 2 MG/ML IV SOLN
INTRAVENOUS | Status: AC
  Administered 2011-02-17: 20 mg
  Filled 2011-02-17: qty 20

## 2011-02-17 MED ORDER — SODIUM CHLORIDE 0.9 % IV BOLUS (SEPSIS)
500.0000 mL | Freq: Once | INTRAVENOUS | Status: AC
  Administered 2011-02-17: 500 mL via INTRAVENOUS

## 2011-02-17 MED ORDER — ROCURONIUM BROMIDE 50 MG/5ML IV SOLN
INTRAVENOUS | Status: AC
  Filled 2011-02-17: qty 2

## 2011-02-17 MED ORDER — ALBUTEROL SULFATE (5 MG/ML) 0.5% IN NEBU
INHALATION_SOLUTION | RESPIRATORY_TRACT | Status: AC
  Filled 2011-02-17: qty 0.5

## 2011-02-17 MED ORDER — IPRATROPIUM BROMIDE 0.02 % IN SOLN
RESPIRATORY_TRACT | Status: AC
  Filled 2011-02-17: qty 2.5

## 2011-02-17 NOTE — ED Notes (Signed)
Diprivan drip titrated to 35 due to patient awakening and trying to move/sit up in bed.

## 2011-02-17 NOTE — ED Notes (Addendum)
Spoke with dr. Ranae Palms, asked if he wanted a bed request put in before hospital ist saw patient in er. Dr. Ranae Palms stated to put in bed request for icu bed, diagnosis of copd exacerbation and respiratory failure. Notified ED Diplomatic Services operational officer.

## 2011-02-17 NOTE — ED Notes (Signed)
ckmb critical high 11.4, notified by lab technician norma.

## 2011-02-17 NOTE — ED Notes (Signed)
Diprivan drip titrated to 20 mcg/kg/min due to patient awakening and attempting to sit up in bed.

## 2011-02-17 NOTE — ED Notes (Addendum)
Patient intubated at 2158 by edp. 24 at the lip, size 8 et tube. Bilateral breath sounds. Patient oxygen saturation 100 via bag valve mask through et tube. Charge nurse brenda Estate manager/land agent, rn and respiratory therapist and myself at bedside.

## 2011-02-17 NOTE — ED Provider Notes (Signed)
History  This chart was scribed for Ian Racer, MD by [name]. This patient was seen in room APA04/APA04 and the patient's care was started at 9:38PM.  CSN: [id number]  Arrival date & time 02/17/11  2138     Chief Complaint  Patient presents with  . Respiratory Distress  . Shortness of Breath    Level 5 Caveat- Pt in respiratory distress  Patient is a 61 y.o. male presenting with shortness of breath. The history is provided by the patient. No language interpreter was used.  Shortness of Breath  The current episode started yesterday. The onset was gradual. The problem occurs continuously. The problem has been gradually worsening. Associated symptoms include shortness of breath. Pertinent negatives include no chest pain, no fever, no rhinorrhea and no sore throat. He has had prior hospitalizations. He has had prior intubations. His past medical history is significant for asthma.   [name] is a 61 y.o. male brought in by ambulance, who presents to the Emergency Department complaining of 24 hours of gradual onset, gradually worsening SOB. He denies fever, chest pain and abdominal pain. Pt states that he is normally on O2 at home. Pt has a h/o COPD and asthma. He confirms a h/o prior intubations and hospitalizations for similar episodes. He is a former smoker and Printmaker.    Pt's PCP is Dr. Drucie [name]   Past Medical History  Diagnosis Date  . Nicotine addiction   . COPD (chronic obstructive pulmonary disease)   . Depression   . Hyperlipidemia   . Hypertension   . CVA (cerebral vascular accident) 2008    with temporary vision loss   . Asthma   . Marijuana abuse   . History of tobacco abuse 01/14/2011  . History of substance abuse 01/14/2011    Marijuana    History reviewed. No pertinent past surgical history.  Family History  Problem Relation Age of Onset  . Lung disease Sister     History  Substance Use Topics  . Smoking status: Former Smoker -- 0.3  packs/day    Types: Cigarettes    Quit date: 10/14/2010  . Smokeless tobacco: Not on file  . Alcohol Use: Yes     occasionally      Review of Systems  Constitutional: Negative for fever.  HENT: Negative for sore throat and rhinorrhea.   Respiratory: Positive for shortness of breath.   Cardiovascular: Negative for chest pain.  Gastrointestinal: Negative for abdominal pain.    Allergies  Review of patient's allergies indicates no known allergies.  Home Medications   Current Outpatient Rx  Name Route Sig Dispense Refill  . ALBUTEROL SULFATE HFA 108 (90 BASE) MCG/ACT IN AERS Inhalation Inhale 2 puffs into the lungs every 4 (four) hours as needed for wheezing. 1 Inhaler 5  . ALBUTEROL SULFATE (2.5 MG/3ML) 0.083% IN NEBU Nebulization Take 3 mLs (2.5 mg total) by nebulization 3 (three) times daily. Use one vial every 6 to 8 hours as needed for wheezing 75 mL 5  . IPRATROPIUM-ALBUTEROL 18-103 MCG/ACT IN AERO Inhalation Inhale 2 puffs into the lungs every 4 (four) hours as needed for wheezing or shortness of breath.    . CLOTRIMAZOLE-BETAMETHASONE 1-0.05 % EX CREA  Apply to affected area 2 times daily 45 g 0  . FLUTICASONE-SALMETEROL 500-50 MCG/DOSE IN AEPB Inhalation Inhale 1 puff into the lungs 2 (two) times daily. 60 each 11  . IPRATROPIUM BROMIDE 0.02 % IN SOLN Nebulization Take 2.5 mLs (500 mcg total) by  nebulization 3 (three) times daily. 75 mL 5  . PREDNISONE 10 MG PO TABS  STARTING TOMORROW; TAKE 6 TABLETS FOR ONE DAY; THE 5 TABLETS THE NEXT DAY; THEN 4 TABLETS THE NEXT DAY; THEN 3 TABLETS THE NEXT DAY; THEN 2 TABLETS THE NEXT DAY; THEN 1 TABLET THE NEXT DAY; THEN STOP. 21 tablet 0  . SIMVASTATIN 40 MG PO TABS Oral Take 1 tablet (40 mg total) by mouth at bedtime. 30 tablet 5  . TRIAMTERENE-HCTZ 37.5-25 MG PO TABS  TAKE ONE TABLET BY MOUTH ONCE DAILY. 30 tablet 4    BP 109/90  Pulse 111  Temp(Src) 98.3 F (36.8 C) (Axillary)  Resp 20  SpO2 100%  Physical Exam  Nursing note  and vitals reviewed. Constitutional: He is oriented to person, place, and time. He appears well-developed and well-nourished. He appears distressed.  HENT:  Head: Normocephalic and atraumatic.  Mouth/Throat: Oropharynx is clear and moist.       No tongue or lip swelling  Eyes: EOM are normal. Pupils are equal, round, and reactive to light.  Neck: Normal range of motion. Neck supple. No tracheal deviation present.  Cardiovascular: Regular rhythm and normal heart sounds.        Tachycardic  Pulmonary/Chest: No stridor. He is in respiratory distress. He has wheezes (Expiratory wheezes).       Decreased air movement  Abdominal: Soft. Bowel sounds are normal. He exhibits no distension. There is no tenderness. There is no rebound and no guarding.  Musculoskeletal: Normal range of motion. He exhibits no edema (No pedal edema) and no tenderness (No calf tenderness).  Neurological: He is alert and oriented to person, place, and time. No cranial nerve deficit.       Moving all extremities  Skin: Skin is warm and dry. No rash noted.  Psychiatric: His mood appears anxious.    ED Course  INTUBATION Date/Time: 02/17/2011 10:08 PM Performed by: [name] Authorized by: [name] Consent: Verbal consent obtained. Indications: respiratory distress Intubation method: video-assisted Patient status: awake Sedatives: etomidate Paralytic: succinylcholine Tube size: 8.0 mm Tube type: cuffed Number of attempts: 1 Cricoid pressure: yes Cords visualized: yes Post-procedure assessment: CO2 detector Breath sounds: equal Cuff inflated: yes Tube secured with: ETT holder   (including critical care time) CRITICAL CARE Performed by: Ranae Palms, Mamie Diiorio   Total critical care time: 30 min  Critical care time was exclusive of separately billable procedures and treating other patients.  Critical care was necessary to treat or prevent imminent or life-threatening deterioration.  Critical care  was time spent personally by me on the following activities: development of treatment plan with patient and/or surrogate as well as nursing, discussions with consultants, evaluation of patient's response to treatment, examination of patient, obtaining history from patient or surrogate, ordering and performing treatments and interventions, ordering and review of laboratory studies, ordering and review of radiographic studies, pulse oximetry and re-evaluation of patient's condition. DIAGNOSTIC STUDIES: Oxygen Saturation is 100% on [place], normal by my interpretation.    COORDINATION OF CARE: 9:48PM-Discussed treatment plan with pt and pt agreed to plan. Consult: Call started at 10:26PM. Discussed pt's condition and treatment plan with Dr. Lesly Rubenstein, admitting hospitalist. Call was ended at 10:29PM.  Labs Reviewed  DIFFERENTIAL - Abnormal; Notable for the following:    Neutrophils Relative 32 (*)    Lymphocytes Relative 54 (*)    Lymphs Abs 4.7 (*)    All other components within normal limits  COMPREHENSIVE METABOLIC PANEL - Abnormal; Notable for the  following:    Glucose, Bld 155 (*)    Total Bilirubin 0.2 (*)    GFR calc non Af Amer 87 (*)    All other components within normal limits  CARDIAC PANEL(CRET KIN+CKTOT+MB+TROPI) - Abnormal; Notable for the following:    Total CK 602 (*)    CK, MB 11.4 (*)    All other components within normal limits  URINALYSIS, ROUTINE W REFLEX MICROSCOPIC - Abnormal; Notable for the following:    Specific Gravity, Urine >1.030 (*)    Hgb urine dipstick TRACE (*)    All other components within normal limits  BLOOD GAS, ARTERIAL - Abnormal; Notable for the following:    pH, Arterial 7.238 (*)    pCO2 arterial 62.6 (*)    pO2, Arterial 317.0 (*)    Bicarbonate 25.8 (*)    All other components within normal limits  URINE MICROSCOPIC-ADD ON - Abnormal; Notable for the following:    Casts GRANULAR CAST (*)    All other components within normal limits  CBC    URINE RAPID DRUG SCREEN (HOSP PERFORMED)   Dg Chest Portable 1 View  02/17/2011  *RADIOLOGY REPORT*  Clinical Data: Respiratory distress.  Shortness of breath.  The endotracheal tube placement.  PORTABLE CHEST - 1 VIEW  Comparison: Chest x-ray 02/17/2011 at to 09:44 p.m.  Findings: The patient has been intubated.  Endotracheal tube terminates 4.4 cm above the carina.  The heart size is normal.  The lungs are clear.  The nodular density from the previous film is not evident.  IMPRESSION: Satisfactory positioning of the endotracheal tube.  Original Report Authenticated By: Jamesetta Orleans. MATTERN, M.D.   Dg Chest Port 1 View  02/17/2011  *RADIOLOGY REPORT*  Clinical Data: Shortness of breath.  Respiratory distress.  PORTABLE CHEST - 1 VIEW  Comparison: 01/16/2011  Findings: Normal heart size and pulmonary vascularity.  Diffuse emphysematous changes in the lungs.  No focal airspace consolidation.  No blunting of costophrenic angles.  No pneumothorax.  Vague nodular opacity projected over the left midlung measuring 7 mm is not present on the prior study may represent a skin lesion.  IMPRESSION: Diffuse pulmonary emphysema.  No focal consolidation.  Original Report Authenticated By: Marlon Pel, M.D.     1. COPD exacerbation   2. Respiratory failure      Date: 02/17/2011  Rate:139  Rhythm: sinus tachycardia  QRS Axis: normal  Intervals: normal  ST/T Wave abnormalities: normal  Conduction Disutrbances:none  Narrative Interpretation:   Old EKG Reviewed: unchanged   MDM  Internist to see in ED.       I personally performed the services described in this documentation, which was scribed in my presence. The recorded information has been reviewed and considered.      Ian Racer, MD 02/17/11 [phone]

## 2011-02-17 NOTE — ED Notes (Signed)
Patient states that he has history of copd. States he started having increasing shortness of breath yesterday.

## 2011-02-17 NOTE — ED Notes (Signed)
Diprivan drip titrated to 30 due to patient awakening and pulling towards tube.

## 2011-02-18 ENCOUNTER — Encounter (HOSPITAL_COMMUNITY): Payer: Self-pay | Admitting: *Deleted

## 2011-02-18 LAB — CARDIAC PANEL(CRET KIN+CKTOT+MB+TROPI)
CK, MB: 11.3 ng/mL (ref 0.3–4.0)
Relative Index: 2 (ref 0.0–2.5)
Relative Index: 2 (ref 0.0–2.5)
Total CK: 576 U/L — ABNORMAL HIGH (ref 7–232)
Total CK: 481 U/L — ABNORMAL HIGH (ref 7–232)
Troponin I: <0.3 ng/mL (ref <0.30)
Troponin I: <0.3 ng/mL (ref <0.30)

## 2011-02-18 LAB — DIFFERENTIAL
Basophils Relative: 0 % (ref 0–1)
Eosinophils Absolute: 0 10*3/uL (ref 0.0–0.7)
Eosinophils Relative: 0 % (ref 0–5)
Monocytes Absolute: 0.2 10*3/uL (ref 0.1–1.0)
Monocytes Relative: 2 % — ABNORMAL LOW (ref 3–12)
Neutrophils Relative %: 94 % — ABNORMAL HIGH (ref 43–77)

## 2011-02-18 LAB — BLOOD GAS, ARTERIAL
Bicarbonate: 22.4 mEq/L (ref 20.0–24.0)
FIO2: 0.35 %
PEEP: 5 cmH2O
RATE: 20 resp/min
pCO2 arterial: 41.2 mmHg (ref 35.0–45.0)
pO2, Arterial: 167 mmHg — ABNORMAL HIGH (ref 80.0–100.0)

## 2011-02-18 LAB — CBC
Hemoglobin: 11.4 g/dL — ABNORMAL LOW (ref 13.0–17.0)
MCH: 30.6 pg (ref 26.0–34.0)
MCHC: 32.8 g/dL (ref 30.0–36.0)
MCV: 93.3 fL (ref 78.0–100.0)

## 2011-02-18 LAB — COMPREHENSIVE METABOLIC PANEL
Albumin: 3.3 g/dL — ABNORMAL LOW (ref 3.5–5.2)
BUN: 14 mg/dL (ref 6–23)
Calcium: 8.5 mg/dL (ref 8.4–10.5)
Creatinine, Ser: 0.82 mg/dL (ref 0.50–1.35)
GFR calc Af Amer: >90 mL/min (ref >90)
Glucose, Bld: 161 mg/dL — ABNORMAL HIGH (ref 70–99)
Potassium: 3.5 mEq/L (ref 3.5–5.1)
Total Protein: 5.8 g/dL — ABNORMAL LOW (ref 6.0–8.3)

## 2011-02-18 LAB — GLUCOSE, CAPILLARY

## 2011-02-18 LAB — MRSA PCR SCREENING: MRSA by PCR: NEGATIVE

## 2011-02-18 MED ORDER — PANTOPRAZOLE SODIUM 40 MG IV SOLR
40.0000 mg | Freq: Once | INTRAVENOUS | Status: AC
  Administered 2011-02-18: 40 mg via INTRAVENOUS

## 2011-02-18 MED ORDER — IPRATROPIUM BROMIDE 0.02 % IN SOLN: 0.5000 mg | RESPIRATORY_TRACT | Status: DC | PRN

## 2011-02-18 MED ORDER — DEXTROSE 5 % IV SOLN
500.0000 mg | Freq: Once | INTRAVENOUS | Status: AC
  Administered 2011-02-18: 500 mg via INTRAVENOUS
  Filled 2011-02-18: qty 500

## 2011-02-18 MED ORDER — PANTOPRAZOLE SODIUM 40 MG IV SOLR
40.0000 mg | INTRAVENOUS | Status: DC
  Administered 2011-02-18 – 2011-02-20 (×3): 40 mg via INTRAVENOUS
  Filled 2011-02-18 (×4): qty 40

## 2011-02-18 MED ORDER — DEXTROSE 5 % IV SOLN
500.0000 mg | INTRAVENOUS | Status: DC
  Administered 2011-02-18 – 2011-02-21 (×4): 500 mg via INTRAVENOUS
  Filled 2011-02-18 (×4): qty 500

## 2011-02-18 MED ORDER — CHLORHEXIDINE GLUCONATE 0.12 % MT SOLN
15.0000 mL | Freq: Two times a day (BID) | OROMUCOSAL | Status: DC
  Administered 2011-02-18 – 2011-02-19 (×4): 15 mL via OROMUCOSAL
  Filled 2011-02-18 (×4): qty 15

## 2011-02-18 MED ORDER — LORAZEPAM 2 MG/ML IJ SOLN
2.0000 mg | INTRAMUSCULAR | Status: DC | PRN
  Administered 2011-02-18 – 2011-02-19 (×2): 2 mg via INTRAVENOUS
  Filled 2011-02-18 (×2): qty 1

## 2011-02-18 MED ORDER — ENALAPRILAT 1.25 MG/ML IV SOLN
0.6250 mg | Freq: Four times a day (QID) | INTRAVENOUS | Status: DC
  Administered 2011-02-18 – 2011-02-20 (×5): 0.625 mg via INTRAVENOUS
  Filled 2011-02-18 (×5): qty 2

## 2011-02-18 MED ORDER — SODIUM CHLORIDE 0.9 % IJ SOLN
INTRAMUSCULAR | Status: AC
  Administered 2011-02-18: 10 mL
  Filled 2011-02-18: qty 3

## 2011-02-18 MED ORDER — ENOXAPARIN SODIUM 40 MG/0.4ML ~~LOC~~ SOLN
40.0000 mg | Freq: Every day | SUBCUTANEOUS | Status: DC
  Administered 2011-02-18 – 2011-02-22 (×5): 40 mg via SUBCUTANEOUS
  Filled 2011-02-18 (×6): qty 0.4

## 2011-02-18 MED ORDER — LORAZEPAM 2 MG/ML IJ SOLN
INTRAMUSCULAR | Status: AC
  Administered 2011-02-18: 2 mg via INTRAVENOUS
  Filled 2011-02-18: qty 1

## 2011-02-18 MED ORDER — METOPROLOL TARTRATE 1 MG/ML IV SOLN: 5.0000 mg | INTRAVENOUS | Status: DC | PRN

## 2011-02-18 MED ORDER — PROPOFOL 10 MG/ML IV EMUL
INTRAVENOUS | Status: AC
  Filled 2011-02-18: qty 100

## 2011-02-18 MED ORDER — ALBUTEROL SULFATE (5 MG/ML) 0.5% IN NEBU: 2.5000 mg | INHALATION_SOLUTION | RESPIRATORY_TRACT | Status: DC | PRN

## 2011-02-18 MED ORDER — DEXTROSE 5 % IV SOLN
INTRAVENOUS | Status: AC
  Filled 2011-02-18: qty 10

## 2011-02-18 MED ORDER — PROPOFOL 10 MG/ML IV EMUL
5.0000 ug/kg/min | INTRAVENOUS | Status: DC
  Administered 2011-02-17: 22:00:00 via INTRAVENOUS
  Administered 2011-02-18: 20 ug/kg/min via INTRAVENOUS
  Administered 2011-02-18: 27.194 ug/kg/min via INTRAVENOUS
  Administered 2011-02-18: 55 ug/kg/min via INTRAVENOUS
  Administered 2011-02-19 (×2): 50 ug/kg/min via INTRAVENOUS
  Filled 2011-02-18 (×4): qty 100

## 2011-02-18 MED ORDER — DEXTROSE 5 % IV SOLN
INTRAVENOUS | Status: AC
  Filled 2011-02-18: qty 500

## 2011-02-18 MED ORDER — METHYLPREDNISOLONE SODIUM SUCC 125 MG IJ SOLR
125.0000 mg | Freq: Four times a day (QID) | INTRAMUSCULAR | Status: DC
  Administered 2011-02-18 – 2011-02-20 (×10): 125 mg via INTRAVENOUS
  Filled 2011-02-18 (×10): qty 2

## 2011-02-18 MED ORDER — DEXTROSE 5 % IV SOLN
1.0000 g | Freq: Once | INTRAVENOUS | Status: AC
  Administered 2011-02-18: 1 g via INTRAVENOUS
  Filled 2011-02-18: qty 10

## 2011-02-18 MED ORDER — DEXTROSE 5 % IV SOLN
1.0000 g | INTRAVENOUS | Status: DC
  Administered 2011-02-18 – 2011-02-22 (×5): 1 g via INTRAVENOUS
  Filled 2011-02-18 (×7): qty 10

## 2011-02-18 MED ORDER — BIOTENE DRY MOUTH MT LIQD
15.0000 mL | Freq: Four times a day (QID) | OROMUCOSAL | Status: DC
  Administered 2011-02-18 – 2011-02-23 (×20): 15 mL via OROMUCOSAL

## 2011-02-18 NOTE — ED Notes (Signed)
Diprivan drip titrated to 45 mcg/kg/min due to patient awakening and agitation.

## 2011-02-18 NOTE — Progress Notes (Signed)
WUA: pt's sedation turned off. Pt alert and oriented. Pt attempts communication. Pt answers yes he is in pain and uncomfortable. Pt not to be be weaned this morning. Sedation resumed first at half strength then full strength a short time later per Pt's request.

## 2011-02-18 NOTE — Progress Notes (Signed)
Subjective: This man was admitted again yesterday with a type II respiratory failure with a background of COPD. This would be about his third admission in 3 months. He seems to come to the hospital every 4-6 weeks and and something intubated and mechanically ventilated. I'm not sure what compliance is at home.           Physical Exam: Blood pressure 117/80, pulse 93, temperature 98.2 F (36.8 C), temperature source Axillary, resp. rate 20, height 6' (1.829 m), weight 62 kg (136 lb 11 oz), SpO2 100.00%. He is on mechanical ventilator. Sedation is being turned off. Lung fields show bilateral wheezing which is somewhat tight. He is in sinus rhythm with a slightly increased heart rate. Abdomen is soft.   Investigations:  Recent Results (from the past 240 hour(s))  MRSA PCR SCREENING     Status: Normal   Collection Time   02/18/11  2:25 AM      Component Value Range Status Comment   MRSA by PCR NEGATIVE  NEGATIVE  Final      Basic Metabolic Panel:  Basename 02/18/11 0244 02/17/11 2200  NA 140 138  K 3.5 4.0  CL 106 100  CO2 23 25  GLUCOSE 161* 155*  BUN 14 14  CREATININE 0.82 0.95  CALCIUM 8.5 9.2  MG 1.7 --  PHOS -- --   Liver Function Tests:  Basename 02/18/11 0244 02/17/11 2200  AST 31 27  ALT 17 16  ALKPHOS 58 69  BILITOT 0.3 0.2*  PROT 5.8* 6.9  ALBUMIN 3.3* 3.7     CBC:  Basename 02/18/11 0244 02/17/11 2200  WBC 7.9 8.8  NEUTROABS 7.4 2.8  HGB 11.4* 13.2  HCT 34.8* 40.0  MCV 93.3 93.9  PLT 197 282    Dg Chest Portable 1 View  02/17/2011  *RADIOLOGY REPORT*  Clinical Data: Respiratory distress.  Shortness of breath.  The endotracheal tube placement.  PORTABLE CHEST - 1 VIEW  Comparison: Chest x-ray 02/17/2011 at to 09:44 p.m.  Findings: The patient has been intubated.  Endotracheal tube terminates 4.4 cm above the carina.  The heart size is normal.  The lungs are clear.  The nodular density from the previous film is not evident.  IMPRESSION:  Satisfactory positioning of the endotracheal tube.  Original Report Authenticated By: Jamesetta Orleans. MATTERN, M.D.   Dg Chest Port 1 View  02/17/2011  *RADIOLOGY REPORT*  Clinical Data: Shortness of breath.  Respiratory distress.  PORTABLE CHEST - 1 VIEW  Comparison: 01/16/2011  Findings: Normal heart size and pulmonary vascularity.  Diffuse emphysematous changes in the lungs.  No focal airspace consolidation.  No blunting of costophrenic angles.  No pneumothorax.  Vague nodular opacity projected over the left midlung measuring 7 mm is not present on the prior study may represent a skin lesion.  IMPRESSION: Diffuse pulmonary emphysema.  No focal consolidation.  Original Report Authenticated By: Marlon Pel, M.D.      Medications: I have reviewed the patient's current medications.  Impression: 1. Type II respiratory failure resulting in mechanical ventilation secondary to COPD exacerbation. 2. History of tobacco abuse, apparently quit smoking approximately 4 months ago.     Plan: 1. Continue mechanical ventilator 2. Continue with intravenous steroids and antibiotics. 3. Pulmonary consultation. I would anticipate that he can come off the ventilator in the next 1-2 days. This man will need close followup as an outpatient to see why he keeps on decompensating almost every month to  such a severe degree.  LOS: 1 day   Wilson Singer Pager [phone]  02/18/2011, 7:24 AM

## 2011-02-18 NOTE — H&P (Signed)
PCP:  Syliva Overman, MD, MD Chief Complaint:  Increasing shortness of breath, tachycardia and cough. History obtainable from the ED physician. At time patient was seen by me he was intubated and mechanically ventilated.  HPI:  Patient is a 61 year old African American male with history of COPD and asthma presenting to the emergency room with shortness of breath there was said to be getting progressively worse. Patient was also said to be tachycardic, coughing and unable to complete a sentence. ABG done showed severe hypercapnia . Patient was subsequently intubated and mechanically ventilated. Intubation was done by the ED physician.      At the time patient was seen by me, he was intubated and mechanically ventilated and therefore history was unobtainable. No family member available to give any history.  Review of Systems: Unobtainable since patient is intubated and mechanically ventilated.  The patient denies anorexia, fever, weight loss,, vision loss, decreased hearing, hoarseness, chest pain, syncope, dyspnea on exertion, peripheral edema, balance deficits, hemoptysis, abdominal pain, melena, hematochezia, severe indigestion/heartburn, hematuria, incontinence, genital sores, muscle weakness, suspicious skin lesions, transient blindness, difficulty walking, depression, unusual weight change, abnormal bleeding, enlarged lymph nodes, angioedema, and breast masses.  Past Medical History:  Past Medical History  Diagnosis Date  . Nicotine addiction   . COPD (chronic obstructive pulmonary disease)   . Depression   . Hyperlipidemia   . Hypertension   . CVA (cerebral vascular accident) 2008    with temporary vision loss   . Asthma   . Marijuana abuse   . History of tobacco abuse 01/14/2011  . History of substance abuse 01/14/2011    Marijuana    History reviewed. No pertinent past surgical history.  Medications:  Prior to Admission medications   Medication Sig Start Date End Date  Taking? Authorizing Provider  albuterol (PROVENTIL HFA;VENTOLIN HFA) 108 (90 BASE) MCG/ACT inhaler Inhale 2 puffs into the lungs every 4 (four) hours as needed for wheezing. 10/11/10 10/11/11  Syliva Overman, MD  albuterol (PROVENTIL) (2.5 MG/3ML) 0.083% nebulizer solution Take 3 mLs (2.5 mg total) by nebulization 3 (three) times daily. Use one vial every 6 to 8 hours as needed for wheezing 01/18/11   Elliot Cousin, MD  albuterol-ipratropium [facility]) 18-103 MCG/ACT inhaler Inhale 2 puffs into the lungs every 4 (four) hours as needed for wheezing or shortness of breath. 01/18/11 01/18/12  Elliot Cousin, MD  clotrimazole-betamethasone (LOTRISONE) cream Apply to affected area 2 times daily 12/17/10 12/17/11  Syliva Overman, MD  Fluticasone-Salmeterol (ADVAIR DISKUS) 500-50 MCG/DOSE AEPB Inhale 1 puff into the lungs 2 (two) times daily. 04/17/10 04/17/11  Syliva Overman, MD  ipratropium (ATROVENT) 0.02 % nebulizer solution Take 2.5 mLs (500 mcg total) by nebulization 3 (three) times daily. 01/18/11   Elliot Cousin, MD  predniSONE (DELTASONE) 10 MG tablet STARTING TOMORROW; TAKE 6 TABLETS FOR ONE DAY; THE 5 TABLETS THE NEXT DAY; THEN 4 TABLETS THE NEXT DAY; THEN 3 TABLETS THE NEXT DAY; THEN 2 TABLETS THE NEXT DAY; THEN 1 TABLET THE NEXT DAY; THEN STOP. 01/18/11   Elliot Cousin, MD  simvastatin (ZOCOR) 40 MG tablet Take 1 tablet (40 mg total) by mouth at bedtime. 10/02/10   Syliva Overman, MD  triamterene-hydrochlorothiazide (MAXZIDE-25) 37.5-25 MG per tablet TAKE ONE TABLET BY MOUTH ONCE DAILY. 11/19/10   Syliva Overman, MD    Allergies:  No Known Allergies  Social History:   reports that he quit smoking about 4 months ago. His smoking use included Cigarettes. He smoked .3 packs per  day. He does not have any smokeless tobacco history on file. He reports that he drinks alcohol. He reports that he uses illicit drugs (Marijuana).  Family History:  Family History  Problem Relation Age of Onset  .  Lung disease Sister     Physical Exam:  Filed Vitals:   02/17/11 2317 02/17/11 2344 02/18/11 0006 02/18/11 0008  BP: 111/78 109/90 129/97   Pulse: 112 111 119   Temp:      TempSrc:      Resp: 24 20 20    SpO2: 100% 100% 100% 100%      General: Intubated and agitated.  Eyes: PERRLA, pink conjunctiva, scleral icterus  ENT: Moist oral mucosa, neck supple, no thyromegaly  Lungs: Inspiratory and expiratory rhonchi all over the lung fields. No rales  Cardiovascular: regular rate and rhythm, no regurgitation, no gallops, no murmurs. No carotid bruits, no JVD  Abdomen: soft, positive BS, non-distended, no organomegaly.  GU: not examined  Neuro: Intubated, agitated and does not follow commands.  Musculoskeletal: strength 5/5 all extremities, no clubbing, cyanosis or edema  Skin: Normal turgor  Psych: Intubated  ?  Labs on Admission:   Basename 02/17/11 2200  NA 138  K 4.0  CL 100  CO2 25  GLUCOSE 155*  BUN 14  CREATININE 0.95  CALCIUM 9.2  MG --  PHOS --     Basename 02/17/11 2200  AST 27  ALT 16  ALKPHOS 69  BILITOT 0.2*  PROT 6.9  ALBUMIN 3.7    No results found for this basename: LIPASE:2,AMYLASE:2 in the last 72 hours   Basename 02/17/11 2200  WBC 8.8  NEUTROABS 2.8  HGB 13.2  HCT 40.0  MCV 93.9  PLT 282     Basename 02/17/11 2200  CKTOTAL 602*  CKMB 11.4*  CKMBINDEX --  TROPONINI <0.30    No results found for this basename: TSH,T4TOTAL,FREET3,T3FREE,THYROIDAB in the last 72 hours  No results found for this basename: VITAMINB12:2,FOLATE:2,FERRITIN:2,TIBC:2,IRON:2,RETICCTPCT:2 in the last 72 hours  Radiological Exams on Admission:  Dg Chest Portable 1 View  02/17/2011  *RADIOLOGY REPORT*  Clinical Data: Respiratory distress.  Shortness of breath.  The endotracheal tube placement.  PORTABLE CHEST - 1 VIEW  Comparison: Chest x-ray 02/17/2011 at to 09:44 p.m.  Findings: The patient has been intubated.  Endotracheal tube terminates  4.4 cm above the carina.  The heart size is normal.  The lungs are clear.  The nodular density from the previous film is not evident.  IMPRESSION: Satisfactory positioning of the endotracheal tube.  Original Report Authenticated By: Jamesetta Orleans. MATTERN, M.D.   Dg Chest Port 1 View  02/17/2011  *RADIOLOGY REPORT*  Clinical Data: Shortness of breath.  Respiratory distress.  PORTABLE CHEST - 1 VIEW  Comparison: 01/16/2011  Findings: Normal heart size and pulmonary vascularity.  Diffuse emphysematous changes in the lungs.  No focal airspace consolidation.  No blunting of costophrenic angles.  No pneumothorax.  Vague nodular opacity projected over the left midlung measuring 7 mm is not present on the prior study may represent a skin lesion.  IMPRESSION: Diffuse pulmonary emphysema.  No focal consolidation.  Original Report Authenticated By: Marlon Pel, M.D.    Assessment/Plan  Present on Admission:   Problems: #1 increasing shortness of breath #2 inability to complete a sentence #3 tachycardia #4 cough and wheezing #5 acute respiratory failure status post intubation with mechanical ventilation  Impression: #1 vent dependent respiratory failure secondary to hypercapnia #2 COPD exacerbation #3 tachycardia #4 hypertension #  5 history of chronic tobacco use #6 history of substance abuse  Plan: #1 admit patient to ICU #2 we'll continue intubation with mechanical ventilation and also present vent parameters #3 continue albuterol and Atrovent nebs. We add IV Solu Medrol as well as IV Zithromax and Rocephin to regimen  #4 will control agitation which IV Ativan #5 control blood pressure with IV Vasotec and tachycardia with IV Lopressor. #6 GI prophylaxis with IV Protonix and DVT prophylaxis with Lovenox. #7 labs; CBC, CMP, cardiac enzymes and ABG will be repeated in a.m. #8 patient be followed by  intensivist in a.m.            Talmage Nap                   [phone]

## 2011-02-18 NOTE — Consult Note (Signed)
Consult requested by: Dr. Karilyn Cota Consult requested for respiratory failure:  HPI: This is a 61 year old who has severe COPD and multiple exacerbations. He has been intubated multiple times. He rarely did stop smoking about 4 months ago. I saw him in office about 2 weeks ago and he appeared to be doing okay with no new complaints. He did not have any wheezing at that time and looked  pretty comfortable. Since then he developed acute respiratory distress and had to be intubated and placed on mechanical ventilation again. He looks comfortable on the ventilator at this point.  Past Medical History  Diagnosis Date  . Nicotine addiction   . COPD (chronic obstructive pulmonary disease)   . Depression   . Hyperlipidemia   . Hypertension   . CVA (cerebral vascular accident) 2008    with temporary vision loss   . Asthma   . Marijuana abuse   . History of tobacco abuse 01/14/2011  . History of substance abuse 01/14/2011    Marijuana     Family History  Problem Relation Age of Onset  . Lung disease Sister      History   Social History  . Marital Status: Widowed    Spouse Name: N/A    Number of Children: 1  . Years of Education: N/A   Occupational History  . unemployed     Social History Main Topics  . Smoking status: Former Smoker -- 0.3 packs/day    Types: Cigarettes    Quit date: 10/14/2010  . Smokeless tobacco: None  . Alcohol Use: Yes     occasionally  . Drug Use: Yes    Special: Marijuana  . Sexually Active: None   Other Topics Concern  . None   Social History Narrative  . None     ROS: Unobtainable because he is on the ventilator    Objective: Vital signs in last 24 hours: Temp:  [97.6 F (36.4 C)-98.3 F (36.8 C)] 98.2 F (36.8 C) (02/11 0400) Pulse Rate:  [90-156] 93  (02/11 0645) Resp:  [15-28] 20  (02/11 0645) BP: (91-187)/(67-106) 117/80 mmHg (02/11 0645) SpO2:  [96 %-100 %] 100 % (02/11 0701) FiO2 (%):  [34.5 %-61 %] 35 % (02/11 0701) Weight:   [61.9 kg (136 lb 7.4 oz)-62 kg (136 lb 11 oz)] 62 kg (136 lb 11 oz) (02/11 0500) Weight change:     Intake/Output from previous day: 02/10 0701 - 02/11 0700 In: 145.2 [I.V.:95.2; IV Piggyback:50] Out: 400 [Urine:400]  PHYSICAL EXAM He opens his eyes. His pupils are reactive. His nose and throat are clear. His mucous membranes are moist. His neck is supple without masses. His chest shows decreased breath sounds prolonged expiration. His heart is regular without gallop. His abdomen is soft without masses. His extremities showed no edema. Central nervous system exam shows he moves all 4 extremities but that's as much as I can get from it because of his being intubated and sedated  Lab Results: Basic Metabolic Panel:  Basename 02/18/11 0244 02/17/11 2200  NA 140 138  K 3.5 4.0  CL 106 100  CO2 23 25  GLUCOSE 161* 155*  BUN 14 14  CREATININE 0.82 0.95  CALCIUM 8.5 9.2  MG 1.7 --  PHOS -- --   Liver Function Tests:  Basename 02/18/11 0244 02/17/11 2200  AST 31 27  ALT 17 16  ALKPHOS 58 69  BILITOT 0.3 0.2*  PROT 5.8* 6.9  ALBUMIN 3.3* 3.7   No results found for  this basename: LIPASE:2,AMYLASE:2 in the last 72 hours No results found for this basename: AMMONIA:2 in the last 72 hours CBC:  Basename 02/18/11 0244 02/17/11 2200  WBC 7.9 8.8  NEUTROABS 7.4 2.8  HGB 11.4* 13.2  HCT 34.8* 40.0  MCV 93.3 93.9  PLT 197 282   Cardiac Enzymes:  Basename 02/18/11 0244 02/17/11 2200  CKTOTAL 576* 602*  CKMB 11.3* 11.4*  CKMBINDEX -- --  TROPONINI <0.30 <0.30   BNP: No results found for this basename: PROBNP:3 in the last 72 hours D-Dimer: No results found for this basename: DDIMER:2 in the last 72 hours CBG: No results found for this basename: GLUCAP:6 in the last 72 hours Hemoglobin A1C: No results found for this basename: HGBA1C in the last 72 hours Fasting Lipid Panel: No results found for this basename: CHOL,HDL,LDLCALC,TRIG,CHOLHDL,LDLDIRECT in the last 72  hours Thyroid Function Tests: No results found for this basename: TSH,T4TOTAL,FREET4,T3FREE,THYROIDAB in the last 72 hours Anemia Panel: No results found for this basename: VITAMINB12,FOLATE,FERRITIN,TIBC,IRON,RETICCTPCT in the last 72 hours Coagulation: No results found for this basename: LABPROT:2,INR:2 in the last 72 hours Urine Drug Screen: Drugs of Abuse     Component Value Date/Time   LABOPIA NONE DETECTED 02/17/2011 2236   COCAINSCRNUR NONE DETECTED 02/17/2011 2236   LABBENZ NONE DETECTED 02/17/2011 2236   AMPHETMU NONE DETECTED 02/17/2011 2236   THCU NONE DETECTED 02/17/2011 2236   LABBARB NONE DETECTED 02/17/2011 2236    Alcohol Level: No results found for this basename: ETH:2 in the last 72 hours Urinalysis:  Basename 02/17/11 2236  COLORURINE YELLOW  LABSPEC >1.030*  PHURINE 5.5  GLUCOSEU NEGATIVE  HGBUR TRACE*  BILIRUBINUR NEGATIVE  KETONESUR NEGATIVE  PROTEINUR NEGATIVE  UROBILINOGEN 0.2  NITRITE NEGATIVE  LEUKOCYTESUR NEGATIVE   Misc. Labs:   ABGS:  Basename 02/18/11 0115  PHART 7.354  PO2ART 167.0*  TCO2 20.5  HCO3 22.4     MICROBIOLOGY: Recent Results (from the past 240 hour(s))  MRSA PCR SCREENING     Status: Normal   Collection Time   02/18/11  2:25 AM      Component Value Range Status Comment   MRSA by PCR NEGATIVE  NEGATIVE  Final     Studies/Results: Dg Chest Portable 1 View  02/17/2011  *RADIOLOGY REPORT*  Clinical Data: Respiratory distress.  Shortness of breath.  The endotracheal tube placement.  PORTABLE CHEST - 1 VIEW  Comparison: Chest x-ray 02/17/2011 at to 09:44 p.m.  Findings: The patient has been intubated.  Endotracheal tube terminates 4.4 cm above the carina.  The heart size is normal.  The lungs are clear.  The nodular density from the previous film is not evident.  IMPRESSION: Satisfactory positioning of the endotracheal tube.  Original Report Authenticated By: Jamesetta Orleans. MATTERN, M.D.   Dg Chest Port 1 View  02/17/2011   *RADIOLOGY REPORT*  Clinical Data: Shortness of breath.  Respiratory distress.  PORTABLE CHEST - 1 VIEW  Comparison: 01/16/2011  Findings: Normal heart size and pulmonary vascularity.  Diffuse emphysematous changes in the lungs.  No focal airspace consolidation.  No blunting of costophrenic angles.  No pneumothorax.  Vague nodular opacity projected over the left midlung measuring 7 mm is not present on the prior study may represent a skin lesion.  IMPRESSION: Diffuse pulmonary emphysema.  No focal consolidation.  Original Report Authenticated By: Marlon Pel, M.D.    Medications:  Prior to Admission:  Prescriptions prior to admission  Medication Sig Dispense Refill  . albuterol (PROVENTIL HFA;VENTOLIN HFA)  108 (90 BASE) MCG/ACT inhaler Inhale 2 puffs into the lungs every 4 (four) hours as needed for wheezing.  1 Inhaler  5  . albuterol (PROVENTIL) (2.5 MG/3ML) 0.083% nebulizer solution Take 3 mLs (2.5 mg total) by nebulization 3 (three) times daily. Use one vial every 6 to 8 hours as needed for wheezing  75 mL  5  . albuterol-ipratropium (COMBIVENT) 18-103 MCG/ACT inhaler Inhale 2 puffs into the lungs every 4 (four) hours as needed for wheezing or shortness of breath.      . clotrimazole-betamethasone (LOTRISONE) cream Apply to affected area 2 times daily  45 g  0  . Fluticasone-Salmeterol (ADVAIR DISKUS) 500-50 MCG/DOSE AEPB Inhale 1 puff into the lungs 2 (two) times daily.  60 each  11  . ipratropium (ATROVENT) 0.02 % nebulizer solution Take 2.5 mLs (500 mcg total) by nebulization 3 (three) times daily.  75 mL  5  . predniSONE (DELTASONE) 10 MG tablet STARTING TOMORROW; TAKE 6 TABLETS FOR ONE DAY; THE 5 TABLETS THE NEXT DAY; THEN 4 TABLETS THE NEXT DAY; THEN 3 TABLETS THE NEXT DAY; THEN 2 TABLETS THE NEXT DAY; THEN 1 TABLET THE NEXT DAY; THEN STOP.  21 tablet  0  . simvastatin (ZOCOR) 40 MG tablet Take 1 tablet (40 mg total) by mouth at bedtime.  30 tablet  5  .  triamterene-hydrochlorothiazide (MAXZIDE-25) 37.5-25 MG per tablet TAKE ONE TABLET BY MOUTH ONCE DAILY.  30 tablet  4   Scheduled:   . sodium chloride   Intravenous Once  . albuterol  2.5 mg Nebulization Q4H  . albuterol      . azithromycin  500 mg Intravenous Q24H  . azithromycin  500 mg Intravenous Once  . cefTRIAXone (ROCEPHIN)  IV  1 g Intravenous Q24H  . cefTRIAXone (ROCEPHIN)  IV  1 g Intravenous Once  . enalaprilat  0.625 mg Intravenous Q6H  . enoxaparin  40 mg Subcutaneous Daily  . etomidate      . ipratropium      . ipratropium  0.5 mg Nebulization Q4H  . lidocaine (cardiac) 100 mg/35ml      . LORazepam      . methylPREDNISolone sodium succinate  125 mg Intravenous Once  . methylPREDNISolone (SOLU-MEDROL) injection  125 mg Intravenous Q6H  . midazolam  3 mg Intravenous Once  . pantoprazole (PROTONIX) IV  40 mg Intravenous Q24H  . pantoprazole (PROTONIX) IV  40 mg Intravenous Once  . propofol      . rocuronium      . sodium chloride  500 mL Intravenous Once  . sodium chloride      . succinylcholine      . DISCONTD: albuterol       Continuous:   . propofol Stopped (02/18/11 0700)   ZOX:WRUEAVWUJ, ipratropium, LORazepam, metoprolol  Assesment: He has acute respiratory failure. He has COPD. He does not appear to have pneumonia. I agree with current treatments. He is on IV steroids IV antibiotics inhaled bronchodilators ulcer prophylaxis and DVT prophylaxis. Active Problems:  * No active hospital problems. *     Plan: I don't think is really anything to add at this point. I do think that he'll probably remain on the ventilator today and we'll try to work at weaning in the morning.    LOS: 1 day   Elyssia Strausser L 02/18/2011, 7:38 AM

## 2011-02-18 NOTE — ED Notes (Signed)
Spoke with tonya in icu, asked if she knew when nicole would be ready for the patient to come upstairs. Stated that she would call me back in probably about 5 minutes.

## 2011-02-18 NOTE — ED Notes (Signed)
Report given to nicole, rn in icu. Waiting for orders from hospitalist. hospitalist saw patient about 20 minutes ago. nicole stated she would call me when she is ready for me to bring the patient upstairs.

## 2011-02-18 NOTE — ED Notes (Signed)
Diprivan drip titrated to 50 mcg/kg/min.

## 2011-02-18 NOTE — ED Notes (Signed)
Titrated drip to 40 mcg/kg/min due to patient awakening and pulling towards tube.

## 2011-02-18 NOTE — Progress Notes (Signed)
Brief Nutrition Note  Patient on vent with OG tube in place. Per MD note,  Patient to be extubated in the morning (2/12). RD to monitor for Tube feed recommendations if necessary.   Iven Finn, MS, RD, LDN Pager [phone]

## 2011-02-18 NOTE — Progress Notes (Signed)
Patient rested today on the vent, no problems and no vent changes made this shift.  The plan is to start weaning him off the vent in the am.  Sats have been 100% most of the day, diminished, with expiratory wheezes.

## 2011-02-18 NOTE — ED Notes (Signed)
Wallet secured with security. One-hundred-twenty dollars in cash (six twenty dollar bills) in wallet along with ebt card and mastercard.

## 2011-02-19 LAB — COMPREHENSIVE METABOLIC PANEL
ALT: 14 U/L (ref 0–53)
AST: 15 U/L (ref 0–37)
Alkaline Phosphatase: 53 U/L (ref 39–117)
CO2: 25 mEq/L (ref 19–32)
Calcium: 9.2 mg/dL (ref 8.4–10.5)
GFR calc Af Amer: >90 mL/min (ref >90)
GFR calc non Af Amer: >90 mL/min (ref >90)
Glucose, Bld: 165 mg/dL — ABNORMAL HIGH (ref 70–99)
Potassium: 3.6 mEq/L (ref 3.5–5.1)
Sodium: 142 mEq/L (ref 135–145)
Total Protein: 5.7 g/dL — ABNORMAL LOW (ref 6.0–8.3)

## 2011-02-19 LAB — BLOOD GAS, ARTERIAL
Acid-Base Excess: 1.5 mmol/L (ref 0.0–2.0)
Bicarbonate: 25.7 mEq/L — ABNORMAL HIGH (ref 20.0–24.0)
FIO2: 40 %
O2 Saturation: 99.6 %
TCO2: 23.3 mmol/L (ref 0–100)
pO2, Arterial: 143 mmHg — ABNORMAL HIGH (ref 80.0–100.0)

## 2011-02-19 LAB — GLUCOSE, CAPILLARY
Glucose-Capillary: 151 mg/dL — ABNORMAL HIGH (ref 70–99)
Glucose-Capillary: 136 mg/dL — ABNORMAL HIGH (ref 70–99)

## 2011-02-19 LAB — CBC
Hemoglobin: 10.8 g/dL — ABNORMAL LOW (ref 13.0–17.0)
MCH: 30.9 pg (ref 26.0–34.0)
Platelets: 196 10*3/uL (ref 150–400)
RBC: 3.49 MIL/uL — ABNORMAL LOW (ref 4.22–5.81)

## 2011-02-19 MED ORDER — SODIUM CHLORIDE 0.9 % IJ SOLN
INTRAMUSCULAR | Status: AC
  Filled 2011-02-19: qty 3

## 2011-02-19 NOTE — Procedures (Signed)
Extubation Procedure Note At 0940 RT performed weaning parameters on patient. Patients NIF -20, VC and ABG performed, ABG results within normal range. All results called to physician, and RT received orders to extubate. RT extubated patient to 4L O2 nasal cannula, patient tolerated well SATs 98%, HR 104 and RR 27. BBS diminished and clear, RN at bedside. RT will continue top monitor Patient Details:   Name: [name] DOB: August 15, 1950 [name]: [id number]   Airway Documentation:  Airway 8 mm (Active)  Secured at (cm) 24 cm 02/19/2011  8:42 AM  Measured From Lips 02/19/2011  8:42 AM  Secured Location Right 02/19/2011  8:42 AM  Secured By [employer] 02/19/2011  8:42 AM  Tube Holder Repositioned Yes 02/19/2011  8:42 AM  Cuff Pressure (cm H2O) 22 cm H2O 02/18/2011 11:06 PM  Site Condition Cool 02/19/2011  8:42 AM    Evaluation  O2 sats: stable throughout Complications: No apparent complications Patient did tolerate procedure well. Bilateral Breath Sounds: Rhonchi Suctioning: Oral Yes  Cloretta Ned 02/19/2011, 10:15 AM

## 2011-02-19 NOTE — Progress Notes (Signed)
Dr. Juanetta Gosling paged and made aware of pt weaning progress. ABG results called. Orders received to extubate patient and follow post-extubated order set. Pt extubated at 1000 by RT. Pt placed on oxygen via nasal cannula. Pt alert and orientated, talking. Pt resting. VS stable. Will continue to monitor.

## 2011-02-19 NOTE — Progress Notes (Signed)
Subjective: He did well yesterday on ventilator support. No new problems have been noted. This morning he is undergoing her wakeup assessment and he does respond. He's having some cough.  Objective: Vital signs in last 24 hours: Temp:  [97.2 F (36.2 C)-98.1 F (36.7 C)] 97.5 F (36.4 C) (02/12 0400) Pulse Rate:  [79-111] 92  (02/12 0600) Resp:  [19-25] 20  (02/12 0600) BP: (85-127)/(47-87) 85/47 mmHg (02/12 0600) SpO2:  [96 %-100 %] 98 % (02/12 0632) FiO2 (%):  [34.4 %-35.7 %] 35 % (02/12 9604) Weight:  [62.8 kg (138 lb 7.2 oz)] 62.8 kg (138 lb 7.2 oz) (02/12 0500) Weight change: 0.9 kg (1 lb 15.7 oz)    Intake/Output from previous day: 02/11 0701 - 02/12 0700 In: 593.2 [I.V.:293.2; IV Piggyback:300] Out: 1800 [Urine:1800]  PHYSICAL EXAM General appearance: alert and moderate distress Resp: rhonchi bilaterally Cardio: regular rate and rhythm, S1, S2 normal, no murmur, click, rub or gallop GI: soft, non-tender; bowel sounds normal; no masses,  no organomegaly Extremities: extremities normal, atraumatic, no cyanosis or edema  Lab Results:    Basic Metabolic Panel:  Basename 02/19/11 0430 02/18/11 0244  NA 142 140  K 3.6 3.5  CL 107 106  CO2 25 23  GLUCOSE 165* 161*  BUN 19 14  CREATININE 0.84 0.82  CALCIUM 9.2 8.5  MG -- 1.7  PHOS -- --   Liver Function Tests:  Basename 02/19/11 0430 02/18/11 0244  AST 15 31  ALT 14 17  ALKPHOS 53 58  BILITOT 0.1* 0.3  PROT 5.7* 5.8*  ALBUMIN 3.1* 3.3*   No results found for this basename: LIPASE:2,AMYLASE:2 in the last 72 hours No results found for this basename: AMMONIA:2 in the last 72 hours CBC:  Basename 02/19/11 0430 02/18/11 0244 02/17/11 2200  WBC 10.9* 7.9 --  NEUTROABS -- 7.4 2.8  HGB 10.8* 11.4* --  HCT 32.1* 34.8* --  MCV 92.0 93.3 --  PLT 196 197 --   Cardiac Enzymes:  Basename 02/18/11 1845 02/18/11 1004 02/18/11 0244  CKTOTAL 365* 481* 576*  CKMB 7.4* 10.2* 11.3*  CKMBINDEX -- -- --  TROPONINI  <0.30 <0.30 <0.30   BNP: No results found for this basename: PROBNP:3 in the last 72 hours D-Dimer: No results found for this basename: DDIMER:2 in the last 72 hours CBG:  Basename 02/19/11 0715 02/18/11 2122  GLUCAP 151* 160*   Hemoglobin A1C: No results found for this basename: HGBA1C in the last 72 hours Fasting Lipid Panel: No results found for this basename: CHOL,HDL,LDLCALC,TRIG,CHOLHDL,LDLDIRECT in the last 72 hours Thyroid Function Tests: No results found for this basename: TSH,T4TOTAL,FREET4,T3FREE,THYROIDAB in the last 72 hours Anemia Panel: No results found for this basename: VITAMINB12,FOLATE,FERRITIN,TIBC,IRON,RETICCTPCT in the last 72 hours Coagulation: No results found for this basename: LABPROT:2,INR:2 in the last 72 hours Urine Drug Screen: Drugs of Abuse     Component Value Date/Time   LABOPIA NONE DETECTED 02/17/2011 2236   COCAINSCRNUR NONE DETECTED 02/17/2011 2236   LABBENZ NONE DETECTED 02/17/2011 2236   AMPHETMU NONE DETECTED 02/17/2011 2236   THCU NONE DETECTED 02/17/2011 2236   LABBARB NONE DETECTED 02/17/2011 2236    Alcohol Level: No results found for this basename: ETH:2 in the last 72 hours Urinalysis:  Basename 02/17/11 2236  COLORURINE YELLOW  LABSPEC >1.030*  PHURINE 5.5  GLUCOSEU NEGATIVE  HGBUR TRACE*  BILIRUBINUR NEGATIVE  KETONESUR NEGATIVE  PROTEINUR NEGATIVE  UROBILINOGEN 0.2  NITRITE NEGATIVE  LEUKOCYTESUR NEGATIVE   Misc. Labs:  ABGS  Basename  02/18/11 0115  PHART 7.354  PO2ART 167.0*  TCO2 20.5  HCO3 22.4   CULTURES Recent Results (from the past 240 hour(s))  MRSA PCR SCREENING     Status: Normal   Collection Time   02/18/11  2:25 AM      Component Value Range Status Comment   MRSA by PCR NEGATIVE  NEGATIVE  Final    Studies/Results: Dg Chest Portable 1 View  02/17/2011  *RADIOLOGY REPORT*  Clinical Data: Respiratory distress.  Shortness of breath.  The endotracheal tube placement.  PORTABLE CHEST - 1 VIEW   Comparison: Chest x-ray 02/17/2011 at to 09:44 p.m.  Findings: The patient has been intubated.  Endotracheal tube terminates 4.4 cm above the carina.  The heart size is normal.  The lungs are clear.  The nodular density from the previous film is not evident.  IMPRESSION: Satisfactory positioning of the endotracheal tube.  Original Report Authenticated By: Jamesetta Orleans. MATTERN, M.D.   Dg Chest Port 1 View  02/17/2011  *RADIOLOGY REPORT*  Clinical Data: Shortness of breath.  Respiratory distress.  PORTABLE CHEST - 1 VIEW  Comparison: 01/16/2011  Findings: Normal heart size and pulmonary vascularity.  Diffuse emphysematous changes in the lungs.  No focal airspace consolidation.  No blunting of costophrenic angles.  No pneumothorax.  Vague nodular opacity projected over the left midlung measuring 7 mm is not present on the prior study may represent a skin lesion.  IMPRESSION: Diffuse pulmonary emphysema.  No focal consolidation.  Original Report Authenticated By: Marlon Pel, M.D.    Medications:  Scheduled:   . albuterol  2.5 mg Nebulization Q4H  . albuterol      . antiseptic oral rinse  15 mL Mouth Rinse QID  . azithromycin  500 mg Intravenous Q24H  . cefTRIAXone (ROCEPHIN)  IV  1 g Intravenous Q24H  . chlorhexidine  15 mL Mouth Rinse BID  . enalaprilat  0.625 mg Intravenous Q6H  . enoxaparin  40 mg Subcutaneous Daily  . ipratropium      . ipratropium  0.5 mg Nebulization Q4H  . lidocaine (cardiac) 100 mg/2ml      . methylPREDNISolone (SOLU-MEDROL) injection  125 mg Intravenous Q6H  . pantoprazole (PROTONIX) IV  40 mg Intravenous Q24H  . rocuronium       Continuous:   . propofol Stopped (02/19/11 0715)   ZOX:WRUEAVWUJ, ipratropium, LORazepam, metoprolol  Assesment: He has acute respiratory failure. He is intubated and on the ventilator. We may be able to wean him today. He has severe COPD and has stopped smoking now. He has hypertension which is well controlled. He has a pretty  significant cough but doesn't seem to have a lot of secretions so I think we can see how he does with weaning Active Problems:  * No active hospital problems. *     Plan: We'll try to wean off of mechanical ventilation today.    LOS: 2 days   Norval Slaven L 02/19/2011, 8:00 AM

## 2011-02-19 NOTE — Progress Notes (Signed)
Subjective: Patient awake on ventilator, awake, following commands  Objective: Vital signs in last 24 hours: Temp:  [97.2 F (36.2 C)-98.1 F (36.7 C)] 97.6 F (36.4 C) (02/12 0800) Pulse Rate:  [79-111] 79  (02/12 0800) Resp:  [19-25] 20  (02/12 0800) BP: (85-115)/(47-87) 109/86 mmHg (02/12 0800) SpO2:  [96 %-100 %] 100 % (02/12 0800) FiO2 (%):  [34.4 %-36.3 %] 36.3 % (02/12 0800) Weight:  [62.8 kg (138 lb 7.2 oz)] 62.8 kg (138 lb 7.2 oz) (02/12 0500) Weight change: 0.9 kg (1 lb 15.7 oz)    Intake/Output from previous day: 02/11 0701 - 02/12 0700 In: 593.2 [I.V.:293.2; IV Piggyback:300] Out: 1800 [Urine:1800]     Physical Exam: General: Alert, awake, in no acute distress. HEENT: No bruits, no goiter. Heart: Regular rate and rhythm, without murmurs, rubs, gallops. Lungs: rhonchi b/l. Abdomen: Soft, nontender, nondistended, positive bowel sounds. Extremities: No clubbing cyanosis or edema with positive pedal pulses. Neuro: Grossly intact, nonfocal.    Lab Results: Basic Metabolic Panel:  Basename 02/19/11 0430 02/18/11 0244  NA 142 140  K 3.6 3.5  CL 107 106  CO2 25 23  GLUCOSE 165* 161*  BUN 19 14  CREATININE 0.84 0.82  CALCIUM 9.2 8.5  MG -- 1.7  PHOS -- --   Liver Function Tests:  Basename 02/19/11 0430 02/18/11 0244  AST 15 31  ALT 14 17  ALKPHOS 53 58  BILITOT 0.1* 0.3  PROT 5.7* 5.8*  ALBUMIN 3.1* 3.3*   No results found for this basename: LIPASE:2,AMYLASE:2 in the last 72 hours No results found for this basename: AMMONIA:2 in the last 72 hours CBC:  Basename 02/19/11 0430 02/18/11 0244 02/17/11 2200  WBC 10.9* 7.9 --  NEUTROABS -- 7.4 2.8  HGB 10.8* 11.4* --  HCT 32.1* 34.8* --  MCV 92.0 93.3 --  PLT 196 197 --   Cardiac Enzymes:  Basename 02/18/11 1845 02/18/11 1004 02/18/11 0244  CKTOTAL 365* 481* 576*  CKMB 7.4* 10.2* 11.3*  CKMBINDEX -- -- --  TROPONINI <0.30 <0.30 <0.30   BNP: No results found for this basename: PROBNP:3 in  the last 72 hours D-Dimer: No results found for this basename: DDIMER:2 in the last 72 hours CBG:  Basename 02/19/11 0715 02/18/11 2122  GLUCAP 151* 160*   Hemoglobin A1C: No results found for this basename: HGBA1C in the last 72 hours Fasting Lipid Panel: No results found for this basename: CHOL,HDL,LDLCALC,TRIG,CHOLHDL,LDLDIRECT in the last 72 hours Thyroid Function Tests: No results found for this basename: TSH,T4TOTAL,FREET4,T3FREE,THYROIDAB in the last 72 hours Anemia Panel: No results found for this basename: VITAMINB12,FOLATE,FERRITIN,TIBC,IRON,RETICCTPCT in the last 72 hours Coagulation: No results found for this basename: LABPROT:2,INR:2 in the last 72 hours Urine Drug Screen: Drugs of Abuse     Component Value Date/Time   LABOPIA NONE DETECTED 02/17/2011 2236   COCAINSCRNUR NONE DETECTED 02/17/2011 2236   LABBENZ NONE DETECTED 02/17/2011 2236   AMPHETMU NONE DETECTED 02/17/2011 2236   THCU NONE DETECTED 02/17/2011 2236   LABBARB NONE DETECTED 02/17/2011 2236    Alcohol Level: No results found for this basename: ETH:2 in the last 72 hours Urinalysis:  Basename 02/17/11 2236  COLORURINE YELLOW  LABSPEC >1.030*  PHURINE 5.5  GLUCOSEU NEGATIVE  HGBUR TRACE*  BILIRUBINUR NEGATIVE  KETONESUR NEGATIVE  PROTEINUR NEGATIVE  UROBILINOGEN 0.2  NITRITE NEGATIVE  LEUKOCYTESUR NEGATIVE    Recent Results (from the past 240 hour(s))  MRSA PCR SCREENING     Status: Normal   Collection Time  02/18/11  2:25 AM      Component Value Range Status Comment   MRSA by PCR NEGATIVE  NEGATIVE  Final     Studies/Results: Dg Chest Portable 1 View  02/17/2011  *RADIOLOGY REPORT*  Clinical Data: Respiratory distress.  Shortness of breath.  The endotracheal tube placement.  PORTABLE CHEST - 1 VIEW  Comparison: Chest x-ray 02/17/2011 at to 09:44 p.m.  Findings: The patient has been intubated.  Endotracheal tube terminates 4.4 cm above the carina.  The heart size is normal.  The lungs are  clear.  The nodular density from the previous film is not evident.  IMPRESSION: Satisfactory positioning of the endotracheal tube.  Original Report Authenticated By: Jamesetta Orleans. MATTERN, M.D.   Dg Chest Port 1 View  02/17/2011  *RADIOLOGY REPORT*  Clinical Data: Shortness of breath.  Respiratory distress.  PORTABLE CHEST - 1 VIEW  Comparison: 01/16/2011  Findings: Normal heart size and pulmonary vascularity.  Diffuse emphysematous changes in the lungs.  No focal airspace consolidation.  No blunting of costophrenic angles.  No pneumothorax.  Vague nodular opacity projected over the left midlung measuring 7 mm is not present on the prior study may represent a skin lesion.  IMPRESSION: Diffuse pulmonary emphysema.  No focal consolidation.  Original Report Authenticated By: Marlon Pel, M.D.    Medications: Scheduled Meds:   . albuterol  2.5 mg Nebulization Q4H  . albuterol      . antiseptic oral rinse  15 mL Mouth Rinse QID  . azithromycin  500 mg Intravenous Q24H  . cefTRIAXone (ROCEPHIN)  IV  1 g Intravenous Q24H  . chlorhexidine  15 mL Mouth Rinse BID  . enalaprilat  0.625 mg Intravenous Q6H  . enoxaparin  40 mg Subcutaneous Daily  . ipratropium      . ipratropium  0.5 mg Nebulization Q4H  . lidocaine (cardiac) 100 mg/37ml      . methylPREDNISolone (SOLU-MEDROL) injection  125 mg Intravenous Q6H  . pantoprazole (PROTONIX) IV  40 mg Intravenous Q24H  . rocuronium       Continuous Infusions:   . propofol Stopped (02/19/11 0715)   PRN Meds:.albuterol, ipratropium, LORazepam, metoprolol  Assessment/Plan:  Principal Problem:  *Acute and chronic respiratory failure (acute-on-chronic), Patient is still on ventilator, he is continued on treatment for copd/resp failure with steroids/abx. Considering possible weaning from vent and extubation today, pulmonary following  Active Problems:  HYPERTENSION, stable   CHRONIC OBSTRUCTIVE PULMONARY DISEASE, ACUTE EXACERBATION, see  above   Anemia, stable  Time :   LOS: 2 days   Melissaann Dizdarevic [facility] Pager: [phone] 02/19/2011, 8:27 AM

## 2011-02-19 NOTE — Progress Notes (Signed)
WUA: Continuous sedation turned off. Pt able to follow commands and attempting to communicate. Plan is to wean patient today, and possible extubation. Sedation remains off. Pt resting comfortably, VS stable. Will continue to monitor.

## 2011-02-20 LAB — BASIC METABOLIC PANEL
BUN: 19 mg/dL (ref 6–23)
Calcium: 9.3 mg/dL (ref 8.4–10.5)
Creatinine, Ser: 0.74 mg/dL (ref 0.50–1.35)
GFR calc Af Amer: >90 mL/min (ref >90)
GFR calc non Af Amer: >90 mL/min (ref >90)
Glucose, Bld: 149 mg/dL — ABNORMAL HIGH (ref 70–99)

## 2011-02-20 LAB — CBC
HCT: 33.9 % — ABNORMAL LOW (ref 39.0–52.0)
Hemoglobin: 11 g/dL — ABNORMAL LOW (ref 13.0–17.0)
MCH: 30.3 pg (ref 26.0–34.0)
MCHC: 32.4 g/dL (ref 30.0–36.0)
RDW: 13.1 % (ref 11.5–15.5)

## 2011-02-20 LAB — GLUCOSE, CAPILLARY
Glucose-Capillary: 148 mg/dL — ABNORMAL HIGH (ref 70–99)
Glucose-Capillary: 156 mg/dL — ABNORMAL HIGH (ref 70–99)

## 2011-02-20 MED ORDER — SODIUM CHLORIDE 0.9 % IJ SOLN
INTRAMUSCULAR | Status: AC
  Administered 2011-02-20: 10 mL
  Filled 2011-02-20: qty 3

## 2011-02-20 MED ORDER — FLUTICASONE-SALMETEROL 500-50 MCG/DOSE IN AEPB
1.0000 | INHALATION_SPRAY | Freq: Two times a day (BID) | RESPIRATORY_TRACT | Status: DC
  Administered 2011-02-20 – 2011-02-23 (×7): 1 via RESPIRATORY_TRACT
  Filled 2011-02-20: qty 14

## 2011-02-20 MED ORDER — ENALAPRIL MALEATE 5 MG PO TABS
2.5000 mg | ORAL_TABLET | Freq: Two times a day (BID) | ORAL | Status: DC
  Administered 2011-02-20 – 2011-02-22 (×4): 2.5 mg via ORAL
  Filled 2011-02-20 (×4): qty 1

## 2011-02-20 MED ORDER — METHYLPREDNISOLONE SODIUM SUCC 125 MG IJ SOLR
80.0000 mg | Freq: Four times a day (QID) | INTRAMUSCULAR | Status: DC
  Administered 2011-02-20 – 2011-02-21 (×4): 80 mg via INTRAVENOUS
  Filled 2011-02-20 (×4): qty 2

## 2011-02-20 NOTE — Progress Notes (Signed)
CARE MANAGEMENT NOTE 02/20/2011  Patient:  [name], [name]   Account Number:  [id number]  Date Initiated:  02/20/2011  Documentation initiated by:  Rosemary Holms  Subjective/Objective Assessment:   Pt admitted with respiratory distress and physically decompensated. PTA lived at home alone.     Action/Plan:   Spoke with pt at bedside. He states that someone was coming to see him Monday but he was at hospital. Previously had [facility] for [facility].   Anticipated DC Date:  02/22/2011   Anticipated DC Plan:        DC Planning Services  CM consult      Choice offered to / List presented to:             [facility] agency  [facility]   Status of service:  In process, will continue to follow Medicare Important Message given?   (If response is "NO", the following Medicare IM given date fields will be blank) Date Medicare IM given:   Date Additional Medicare IM given:    Discharge Disposition:    Per UR Regulation:    Comments:  02/20/11 1030 Jerrel Tiberio RN BSN CM [facility] has pt as a client but has not seen pt yet. [facility] notified that once DC'd pt would like to use them if [facility] is needed for COPD program. Pt to be moved to floor today. CM to follow.

## 2011-02-20 NOTE — Progress Notes (Signed)
Subjective: Patient extubated yesterday morning.  He is currently on a nasal cannula.  Does not report any shortness of breath or cough.  Objective: Vital signs in last 24 hours: Temp:  [97.8 F (36.6 C)-99 F (37.2 C)] 99 F (37.2 C) (02/13 0800) Pulse Rate:  [85-110] 86  (02/13 0500) Resp:  [16-26] 16  (02/13 0500) BP: (74-133)/(56-96) 121/69 mmHg (02/13 0500) SpO2:  [95 %-100 %] 95 % (02/13 0741) FiO2 (%):  [39.4 %-40.5 %] 40 % (02/12 1000) Weight:  [60.5 kg (133 lb 6.1 oz)] 60.5 kg (133 lb 6.1 oz) (02/13 0500) Weight change: -2.3 kg (-5 lb 1.1 oz) Last BM Date: 02/16/11  Intake/Output from previous day: 02/12 0701 - 02/13 0700 In: 931.9 [P.O.:630; I.V.:1.9; IV Piggyback:300] Out: 1800 [Urine:1800]     Physical Exam: General: Alert, awake, oriented x3, in no acute distress. HEENT: No bruits, no goiter. Heart: Regular rate and rhythm, without murmurs, rubs, gallops. Lungs: decreased breath sounds b/l Abdomen: Soft, nontender, nondistended, positive bowel sounds. Extremities: No clubbing cyanosis or edema with positive pedal pulses. Neuro: Grossly intact, nonfocal.    Lab Results: Basic Metabolic Panel:  Basename 02/20/11 0518 02/19/11 0430 02/18/11 0244  NA 142 142 --  K 4.1 3.6 --  CL 108 107 --  CO2 28 25 --  GLUCOSE 149* 165* --  BUN 19 19 --  CREATININE 0.74 0.84 --  CALCIUM 9.3 9.2 --  MG -- -- 1.7  PHOS -- -- --   Liver Function Tests:  Basename 02/19/11 0430 02/18/11 0244  AST 15 31  ALT 14 17  ALKPHOS 53 58  BILITOT 0.1* 0.3  PROT 5.7* 5.8*  ALBUMIN 3.1* 3.3*   No results found for this basename: LIPASE:2,AMYLASE:2 in the last 72 hours No results found for this basename: AMMONIA:2 in the last 72 hours CBC:  Basename 02/20/11 0518 02/19/11 0430 02/18/11 0244 02/17/11 2200  WBC 15.3* 10.9* -- --  NEUTROABS -- -- 7.4 2.8  HGB 11.0* 10.8* -- --  HCT 33.9* 32.1* -- --  MCV 93.4 92.0 -- --  PLT 200 196 -- --   Cardiac Enzymes:  Basename  02/18/11 1845 02/18/11 1004 02/18/11 0244  CKTOTAL 365* 481* 576*  CKMB 7.4* 10.2* 11.3*  CKMBINDEX -- -- --  TROPONINI <0.30 <0.30 <0.30   BNP: No results found for this basename: PROBNP:3 in the last 72 hours D-Dimer: No results found for this basename: DDIMER:2 in the last 72 hours CBG:  Basename 02/20/11 0739 02/19/11 1911 02/19/11 1625 02/19/11 1136 02/19/11 0715 02/18/11 2122  GLUCAP 148* 138* 165* 136* 151* 160*   Hemoglobin A1C: No results found for this basename: HGBA1C in the last 72 hours Fasting Lipid Panel: No results found for this basename: CHOL,HDL,LDLCALC,TRIG,CHOLHDL,LDLDIRECT in the last 72 hours Thyroid Function Tests: No results found for this basename: TSH,T4TOTAL,FREET4,T3FREE,THYROIDAB in the last 72 hours Anemia Panel: No results found for this basename: VITAMINB12,FOLATE,FERRITIN,TIBC,IRON,RETICCTPCT in the last 72 hours Coagulation: No results found for this basename: LABPROT:2,INR:2 in the last 72 hours Urine Drug Screen: Drugs of Abuse     Component Value Date/Time   LABOPIA NONE DETECTED 02/17/2011 2236   COCAINSCRNUR NONE DETECTED 02/17/2011 2236   LABBENZ NONE DETECTED 02/17/2011 2236   AMPHETMU NONE DETECTED 02/17/2011 2236   THCU NONE DETECTED 02/17/2011 2236   LABBARB NONE DETECTED 02/17/2011 2236    Alcohol Level: No results found for this basename: ETH:2 in the last 72 hours Urinalysis:  Basename 02/17/11 2236  COLORURINE YELLOW  LABSPEC >1.030*  PHURINE 5.5  GLUCOSEU NEGATIVE  HGBUR TRACE*  BILIRUBINUR NEGATIVE  KETONESUR NEGATIVE  PROTEINUR NEGATIVE  UROBILINOGEN 0.2  NITRITE NEGATIVE  LEUKOCYTESUR NEGATIVE    Recent Results (from the past 240 hour(s))  MRSA PCR SCREENING     Status: Normal   Collection Time   02/18/11  2:25 AM      Component Value Range Status Comment   MRSA by PCR NEGATIVE  NEGATIVE  Final     Studies/Results: No results found.  Medications: Scheduled Meds:   . albuterol  2.5 mg Nebulization Q4H  .  antiseptic oral rinse  15 mL Mouth Rinse QID  . azithromycin  500 mg Intravenous Q24H  . cefTRIAXone (ROCEPHIN)  IV  1 g Intravenous Q24H  . chlorhexidine  15 mL Mouth Rinse BID  . enalaprilat  0.625 mg Intravenous Q6H  . enoxaparin  40 mg Subcutaneous Daily  . ipratropium  0.5 mg Nebulization Q4H  . methylPREDNISolone (SOLU-MEDROL) injection  125 mg Intravenous Q6H  . pantoprazole (PROTONIX) IV  40 mg Intravenous Q24H  . sodium chloride       Continuous Infusions:   . propofol Stopped (02/19/11 0715)   PRN Meds:.albuterol, ipratropium, LORazepam, metoprolol  Assessment/Plan:  Principal Problem:  *Acute and chronic respiratory failure (acute-on-chronic), s/p intubation, now extubated.   Patient has made significant improvements in his respiratory status.  He will be continued on neb treatments, abx, we will decrease his dose of IV steroids.  Continue pulmonary hygiene.  Active Problems:  HYPERTENSION, stable   CHRONIC OBSTRUCTIVE PULMONARY DISEASE, ACUTE EXACERBATION, see treatment above,   Anemia, stable  Transfer to telemetry today    LOS: 3 days   Jatavion Peaster [facility] Pager: [phone] 02/20/2011, 9:16 AM

## 2011-02-20 NOTE — Progress Notes (Signed)
Subjective: He was successfully extubated yesterday. He feels well. He says he's not short of breath. He has been on a full liquid diet.  Objective: Vital signs in last 24 hours: Temp:  [97.8 F (36.6 C)-99 F (37.2 C)] 99 F (37.2 C) (02/13 0800) Pulse Rate:  [85-110] 86  (02/13 0500) Resp:  [16-26] 16  (02/13 0500) BP: (74-134)/(56-96) 121/69 mmHg (02/13 0500) SpO2:  [95 %-100 %] 95 % (02/13 0741) FiO2 (%):  [35.8 %-40.9 %] 40 % (02/12 1000) Weight:  [60.5 kg (133 lb 6.1 oz)] 60.5 kg (133 lb 6.1 oz) (02/13 0500) Weight change: -2.3 kg (-5 lb 1.1 oz) Last BM Date: 02/16/11  Intake/Output from previous day: 02/12 0701 - 02/13 0700 In: 931.9 [P.O.:630; I.V.:1.9; IV Piggyback:300] Out: 1800 [Urine:1800]  PHYSICAL EXAM General appearance: alert, cooperative and no distress Resp: diminished breath sounds bilaterally Cardio: regular rate and rhythm, S1, S2 normal, no murmur, click, rub or gallop GI: soft, non-tender; bowel sounds normal; no masses,  no organomegaly Extremities: extremities normal, atraumatic, no cyanosis or edema  Lab Results:    Basic Metabolic Panel:  Basename 02/20/11 0518 02/19/11 0430 02/18/11 0244  NA 142 142 --  K 4.1 3.6 --  CL 108 107 --  CO2 28 25 --  GLUCOSE 149* 165* --  BUN 19 19 --  CREATININE 0.74 0.84 --  CALCIUM 9.3 9.2 --  MG -- -- 1.7  PHOS -- -- --   Liver Function Tests:  Basename 02/19/11 0430 02/18/11 0244  AST 15 31  ALT 14 17  ALKPHOS 53 58  BILITOT 0.1* 0.3  PROT 5.7* 5.8*  ALBUMIN 3.1* 3.3*   No results found for this basename: LIPASE:2,AMYLASE:2 in the last 72 hours No results found for this basename: AMMONIA:2 in the last 72 hours CBC:  Basename 02/20/11 0518 02/19/11 0430 02/18/11 0244 02/17/11 2200  WBC 15.3* 10.9* -- --  NEUTROABS -- -- 7.4 2.8  HGB 11.0* 10.8* -- --  HCT 33.9* 32.1* -- --  MCV 93.4 92.0 -- --  PLT 200 196 -- --   Cardiac Enzymes:  Basename 02/18/11 1845 02/18/11 1004 02/18/11 0244    CKTOTAL 365* 481* 576*  CKMB 7.4* 10.2* 11.3*  CKMBINDEX -- -- --  TROPONINI <0.30 <0.30 <0.30   BNP: No results found for this basename: PROBNP:3 in the last 72 hours D-Dimer: No results found for this basename: DDIMER:2 in the last 72 hours CBG:  Basename 02/20/11 0739 02/19/11 1911 02/19/11 1625 02/19/11 1136 02/19/11 0715 02/18/11 2122  GLUCAP 148* 138* 165* 136* 151* 160*   Hemoglobin A1C: No results found for this basename: HGBA1C in the last 72 hours Fasting Lipid Panel: No results found for this basename: CHOL,HDL,LDLCALC,TRIG,CHOLHDL,LDLDIRECT in the last 72 hours Thyroid Function Tests: No results found for this basename: TSH,T4TOTAL,FREET4,T3FREE,THYROIDAB in the last 72 hours Anemia Panel: No results found for this basename: VITAMINB12,FOLATE,FERRITIN,TIBC,IRON,RETICCTPCT in the last 72 hours Coagulation: No results found for this basename: LABPROT:2,INR:2 in the last 72 hours Urine Drug Screen: Drugs of Abuse     Component Value Date/Time   LABOPIA NONE DETECTED 02/17/2011 2236   COCAINSCRNUR NONE DETECTED 02/17/2011 2236   LABBENZ NONE DETECTED 02/17/2011 2236   AMPHETMU NONE DETECTED 02/17/2011 2236   THCU NONE DETECTED 02/17/2011 2236   LABBARB NONE DETECTED 02/17/2011 2236    Alcohol Level: No results found for this basename: ETH:2 in the last 72 hours Urinalysis:  Basename 02/17/11 2236  COLORURINE YELLOW  LABSPEC >1.030*  PHURINE  5.5  GLUCOSEU NEGATIVE  HGBUR TRACE*  BILIRUBINUR NEGATIVE  KETONESUR NEGATIVE  PROTEINUR NEGATIVE  UROBILINOGEN 0.2  NITRITE NEGATIVE  LEUKOCYTESUR NEGATIVE   Misc. Labs:  ABGS  Basename 02/19/11 0945  PHART 7.408  PO2ART 143.0*  TCO2 23.3  HCO3 25.7*   CULTURES Recent Results (from the past 240 hour(s))  MRSA PCR SCREENING     Status: Normal   Collection Time   02/18/11  2:25 AM      Component Value Range Status Comment   MRSA by PCR NEGATIVE  NEGATIVE  Final    Studies/Results: No results  found.  Medications:  Scheduled:   . albuterol  2.5 mg Nebulization Q4H  . antiseptic oral rinse  15 mL Mouth Rinse QID  . azithromycin  500 mg Intravenous Q24H  . cefTRIAXone (ROCEPHIN)  IV  1 g Intravenous Q24H  . chlorhexidine  15 mL Mouth Rinse BID  . enalaprilat  0.625 mg Intravenous Q6H  . enoxaparin  40 mg Subcutaneous Daily  . ipratropium  0.5 mg Nebulization Q4H  . methylPREDNISolone (SOLU-MEDROL) injection  125 mg Intravenous Q6H  . pantoprazole (PROTONIX) IV  40 mg Intravenous Q24H  . sodium chloride       Continuous:   . propofol Stopped (02/19/11 0715)   WUJ:[id number], ipratropium, LORazepam, metoprolol  Assesment: He had acute respiratory failure. He improved and was able to be extubated yesterday. He has severe COPD. He has hypertension which is well controlled. He looks overall much better. Principal Problem:  *Acute and chronic respiratory failure (acute-on-chronic) Active Problems:  HYPERTENSION  CHRONIC OBSTRUCTIVE PULMONARY DISEASE, ACUTE EXACERBATION  Anemia    Plan: Continue his IV antibiotics steroids and nebulizer treatments et Karie Soda.    LOS: 3 days   Ramel Tobon L 02/20/2011, 8:06 AM

## 2011-02-21 LAB — CBC
Hemoglobin: 11.1 g/dL — ABNORMAL LOW (ref 13.0–17.0)
MCH: 30.4 pg (ref 26.0–34.0)
Platelets: 196 10*3/uL (ref 150–400)
RBC: 3.65 MIL/uL — ABNORMAL LOW (ref 4.22–5.81)
WBC: 15.1 10*3/uL — ABNORMAL HIGH (ref 4.0–10.5)

## 2011-02-21 LAB — GLUCOSE, CAPILLARY: Glucose-Capillary: 150 mg/dL — ABNORMAL HIGH (ref 70–99)

## 2011-02-21 MED ORDER — PANTOPRAZOLE SODIUM 40 MG PO TBEC
40.0000 mg | DELAYED_RELEASE_TABLET | Freq: Every day | ORAL | Status: DC
  Administered 2011-02-21 – 2011-02-22 (×2): 40 mg via ORAL
  Filled 2011-02-21 (×2): qty 1

## 2011-02-21 MED ORDER — PREDNISONE 20 MG PO TABS
60.0000 mg | ORAL_TABLET | Freq: Every day | ORAL | Status: DC
  Administered 2011-02-21 – 2011-02-23 (×3): 60 mg via ORAL
  Filled 2011-02-21 (×3): qty 3

## 2011-02-21 NOTE — Progress Notes (Signed)
Subjective: He is overall about the same. He says he's not as short of breath. He denies any cough.  Objective: Vital signs in last 24 hours: Temp:  [97.7 F (36.5 C)-98.9 F (37.2 C)] 97.7 F (36.5 C) (02/14 0800) Pulse Rate:  [70-103] 70  (02/14 0600) Resp:  [13-22] 13  (02/14 0600) BP: (95-143)/(55-82) 127/58 mmHg (02/14 0600) SpO2:  [95 %-100 %] 97 % (02/14 0817) Weight:  [60.5 kg (133 lb 6.1 oz)] 60.5 kg (133 lb 6.1 oz) (02/14 0500) Weight change: 0 kg (0 lb) Last BM Date: 02/16/11  Intake/Output from previous day: 02/13 0701 - 02/14 0700 In: 1070 [P.O.:760; I.V.:10; IV Piggyback:300] Out: 1700 [Urine:1700]  PHYSICAL EXAM General appearance: alert, cooperative and mild distress Resp: diminished breath sounds bilaterally and Using accessory muscles of respiration Cardio: regular rate and rhythm, S1, S2 normal, no murmur, click, rub or gallop GI: soft, non-tender; bowel sounds normal; no masses,  no organomegaly Extremities: extremities normal, atraumatic, no cyanosis or edema  Lab Results:    Basic Metabolic Panel:  Basename 02/20/11 0518 02/19/11 0430  NA 142 142  K 4.1 3.6  CL 108 107  CO2 28 25  GLUCOSE 149* 165*  BUN 19 19  CREATININE 0.74 0.84  CALCIUM 9.3 9.2  MG -- --  PHOS -- --   Liver Function Tests:  Basename 02/19/11 0430  AST 15  ALT 14  ALKPHOS 53  BILITOT 0.1*  PROT 5.7*  ALBUMIN 3.1*   No results found for this basename: LIPASE:2,AMYLASE:2 in the last 72 hours No results found for this basename: AMMONIA:2 in the last 72 hours CBC:  Basename 02/21/11 0429 02/20/11 0518  WBC 15.1* 15.3*  NEUTROABS -- --  HGB 11.1* 11.0*  HCT 34.1* 33.9*  MCV 93.4 93.4  PLT 196 200   Cardiac Enzymes:  Basename 02/18/11 1845 02/18/11 1004  CKTOTAL 365* 481*  CKMB 7.4* 10.2*  CKMBINDEX -- --  TROPONINI <0.30 <0.30   BNP: No results found for this basename: PROBNP:3 in the last 72 hours D-Dimer: No results found for this basename: DDIMER:2  in the last 72 hours CBG:  Basename 02/20/11 1647 02/20/11 1138 02/20/11 0739 02/19/11 1911 02/19/11 1625 02/19/11 1136  GLUCAP 167* 156* 148* 138* 165* 136*   Hemoglobin A1C: No results found for this basename: HGBA1C in the last 72 hours Fasting Lipid Panel: No results found for this basename: CHOL,HDL,LDLCALC,TRIG,CHOLHDL,LDLDIRECT in the last 72 hours Thyroid Function Tests: No results found for this basename: TSH,T4TOTAL,FREET4,T3FREE,THYROIDAB in the last 72 hours Anemia Panel: No results found for this basename: VITAMINB12,FOLATE,FERRITIN,TIBC,IRON,RETICCTPCT in the last 72 hours Coagulation: No results found for this basename: LABPROT:2,INR:2 in the last 72 hours Urine Drug Screen: Drugs of Abuse     Component Value Date/Time   LABOPIA NONE DETECTED 02/17/2011 2236   COCAINSCRNUR NONE DETECTED 02/17/2011 2236   LABBENZ NONE DETECTED 02/17/2011 2236   AMPHETMU NONE DETECTED 02/17/2011 2236   THCU NONE DETECTED 02/17/2011 2236   LABBARB NONE DETECTED 02/17/2011 2236    Alcohol Level: No results found for this basename: ETH:2 in the last 72 hours Urinalysis: No results found for this basename: COLORURINE:2,APPERANCEUR:2,LABSPEC:2,PHURINE:2,GLUCOSEU:2,HGBUR:2,BILIRUBINUR:2,KETONESUR:2,PROTEINUR:2,UROBILINOGEN:2,NITRITE:2,LEUKOCYTESUR:2 in the last 72 hours Misc. Labs:  ABGS  Basename 02/19/11 0945  PHART 7.408  PO2ART 143.0*  TCO2 23.3  HCO3 25.7*   CULTURES Recent Results (from the past 240 hour(s))  MRSA PCR SCREENING     Status: Normal   Collection Time   02/18/11  2:25 AM  Component Value Range Status Comment   MRSA by PCR NEGATIVE  NEGATIVE  Final    Studies/Results: No results found.  Medications:  Scheduled:   . albuterol  2.5 mg Nebulization Q4H  . antiseptic oral rinse  15 mL Mouth Rinse QID  . azithromycin  500 mg Intravenous Q24H  . cefTRIAXone (ROCEPHIN)  IV  1 g Intravenous Q24H  . enalapril  2.5 mg Oral BID  . enoxaparin  40 mg Subcutaneous  Daily  . Fluticasone-Salmeterol  1 puff Inhalation BID  . ipratropium  0.5 mg Nebulization Q4H  . methylPREDNISolone (SOLU-MEDROL) injection  80 mg Intravenous Q6H  . pantoprazole (PROTONIX) IV  40 mg Intravenous Q24H  . sodium chloride      . DISCONTD: chlorhexidine  15 mL Mouth Rinse BID  . DISCONTD: enalaprilat  0.625 mg Intravenous Q6H  . DISCONTD: methylPREDNISolone (SOLU-MEDROL) injection  125 mg Intravenous Q6H   Continuous:   . propofol Stopped (02/19/11 0715)   JWJ:[id number], ipratropium, LORazepam, metoprolol  Assesment: He had acute respiratory failure and was intubated and briefly on the ventilator. He is much improved but is still having to use accessory muscles of respiration I do not think he's back to baseline. At baseline he has severe COPD. Principal Problem:  *Acute and chronic respiratory failure (acute-on-chronic) Active Problems:  HYPERTENSION  CHRONIC OBSTRUCTIVE PULMONARY DISEASE, ACUTE EXACERBATION  Anemia    Plan: I think it's okay to switch him to prednisone probably move him from the ICU and see if we can get him to start ambulating some.    LOS: 4 days   Jnaya Butrick L 02/21/2011, 8:40 AM

## 2011-02-21 NOTE — Progress Notes (Signed)
Pt received to room 313 from ICU. Pt stable on transfer. Telemetry applied to pt.

## 2011-02-21 NOTE — Progress Notes (Signed)
Pt to be transferred to room 313 per MD order. Report called to RN. Pt transferred via wheelchair with personal belongings. Family members aware of transfer.

## 2011-02-21 NOTE — Progress Notes (Signed)
Subjective: No new complaints, breathing is about the same as yesterday, no cough  Objective: Vital signs in last 24 hours: Temp:  [97.7 F (36.5 C)-98.9 F (37.2 C)] 97.7 F (36.5 C) (02/14 0800) Pulse Rate:  [70-103] 70  (02/14 0600) Resp:  [13-22] 13  (02/14 0600) BP: (95-143)/(55-82) 127/58 mmHg (02/14 0600) SpO2:  [95 %-100 %] 97 % (02/14 0600) Weight:  [60.5 kg (133 lb 6.1 oz)] 60.5 kg (133 lb 6.1 oz) (02/14 0500) Weight change: 0 kg (0 lb) Last BM Date: 02/16/11  Intake/Output from previous day: 02/13 0701 - 02/14 0700 In: 1070 [P.O.:760; I.V.:10; IV Piggyback:300] Out: 1700 [Urine:1700]     Physical Exam: General: Alert, awake, oriented x3, in no acute distress. HEENT: No bruits, no goiter. Heart: Regular rate and rhythm, without murmurs, rubs, gallops. Lungs: diminished breath sounds b/l. Abdomen: Soft, nontender, nondistended, positive bowel sounds. Extremities: No clubbing cyanosis or edema with positive pedal pulses. Neuro: Grossly intact, nonfocal.    Lab Results: Basic Metabolic Panel:  Basename 02/20/11 0518 02/19/11 0430  NA 142 142  K 4.1 3.6  CL 108 107  CO2 28 25  GLUCOSE 149* 165*  BUN 19 19  CREATININE 0.74 0.84  CALCIUM 9.3 9.2  MG -- --  PHOS -- --   Liver Function Tests:  Basename 02/19/11 0430  AST 15  ALT 14  ALKPHOS 53  BILITOT 0.1*  PROT 5.7*  ALBUMIN 3.1*   No results found for this basename: LIPASE:2,AMYLASE:2 in the last 72 hours No results found for this basename: AMMONIA:2 in the last 72 hours CBC:  Basename 02/21/11 0429 02/20/11 0518  WBC 15.1* 15.3*  NEUTROABS -- --  HGB 11.1* 11.0*  HCT 34.1* 33.9*  MCV 93.4 93.4  PLT 196 200   Cardiac Enzymes:  Basename 02/18/11 1845 02/18/11 1004  CKTOTAL 365* 481*  CKMB 7.4* 10.2*  CKMBINDEX -- --  TROPONINI <0.30 <0.30   BNP: No results found for this basename: PROBNP:3 in the last 72 hours D-Dimer: No results found for this basename: DDIMER:2 in the last 72  hours CBG:  Basename 02/20/11 1647 02/20/11 1138 02/20/11 0739 02/19/11 1911 02/19/11 1625 02/19/11 1136  GLUCAP 167* 156* 148* 138* 165* 136*   Hemoglobin A1C: No results found for this basename: HGBA1C in the last 72 hours Fasting Lipid Panel: No results found for this basename: CHOL,HDL,LDLCALC,TRIG,CHOLHDL,LDLDIRECT in the last 72 hours Thyroid Function Tests: No results found for this basename: TSH,T4TOTAL,FREET4,T3FREE,THYROIDAB in the last 72 hours Anemia Panel: No results found for this basename: VITAMINB12,FOLATE,FERRITIN,TIBC,IRON,RETICCTPCT in the last 72 hours Coagulation: No results found for this basename: LABPROT:2,INR:2 in the last 72 hours Urine Drug Screen: Drugs of Abuse     Component Value Date/Time   LABOPIA NONE DETECTED 02/17/2011 2236   COCAINSCRNUR NONE DETECTED 02/17/2011 2236   LABBENZ NONE DETECTED 02/17/2011 2236   AMPHETMU NONE DETECTED 02/17/2011 2236   THCU NONE DETECTED 02/17/2011 2236   LABBARB NONE DETECTED 02/17/2011 2236    Alcohol Level: No results found for this basename: ETH:2 in the last 72 hours Urinalysis: No results found for this basename: COLORURINE:2,APPERANCEUR:2,LABSPEC:2,PHURINE:2,GLUCOSEU:2,HGBUR:2,BILIRUBINUR:2,KETONESUR:2,PROTEINUR:2,UROBILINOGEN:2,NITRITE:2,LEUKOCYTESUR:2 in the last 72 hours  Recent Results (from the past 240 hour(s))  MRSA PCR SCREENING     Status: Normal   Collection Time   02/18/11  2:25 AM      Component Value Range Status Comment   MRSA by PCR NEGATIVE  NEGATIVE  Final     Studies/Results: No results found.  Medications: Scheduled Meds:   .  albuterol  2.5 mg Nebulization Q4H  . antiseptic oral rinse  15 mL Mouth Rinse QID  . azithromycin  500 mg Intravenous Q24H  . cefTRIAXone (ROCEPHIN)  IV  1 g Intravenous Q24H  . enalapril  2.5 mg Oral BID  . enoxaparin  40 mg Subcutaneous Daily  . Fluticasone-Salmeterol  1 puff Inhalation BID  . ipratropium  0.5 mg Nebulization Q4H  . methylPREDNISolone  (SOLU-MEDROL) injection  80 mg Intravenous Q6H  . pantoprazole (PROTONIX) IV  40 mg Intravenous Q24H  . sodium chloride      . DISCONTD: chlorhexidine  15 mL Mouth Rinse BID  . DISCONTD: enalaprilat  0.625 mg Intravenous Q6H  . DISCONTD: methylPREDNISolone (SOLU-MEDROL) injection  125 mg Intravenous Q6H   Continuous Infusions:   . propofol Stopped (02/19/11 0715)   PRN Meds:.albuterol, ipratropium, LORazepam, metoprolol  Assessment/Plan:  Principal Problem:  *Acute and chronic respiratory failure (acute-on-chronic), he appears stable today.  No real change from yesterday.  We will begin to ambulate him.  Continue pulmonary hygiene. Consider changing solumedrol to prednisone, will discuss with pulmonology, currently awaiting floor bed. Anticipate patient will be ready for discharge in next 24-48hrs Active Problems:  HYPERTENSION, stable  CHRONIC OBSTRUCTIVE PULMONARY DISEASE, ACUTE EXACERBATION  Anemia    LOS: 4 days   Willys Salvino [facility] Pager: [phone] 02/21/2011, 8:17 AM

## 2011-02-21 NOTE — Evaluation (Signed)
Physical Therapy Evaluation Patient Details Name: MAXFIELD GILDERSLEEVE [name]: [id number] DOB: 1950-11-22 Today's Date: 02/21/2011  Problem List:  Patient Active Problem List  Diagnoses  . VITAMIN D DEFICIENCY  . HYPERLIPIDEMIA  . DEHYDRATION  . HYPERTENSION  . CHRONIC OBSTRUCTIVE PULMONARY DISEASE, ACUTE EXACERBATION  . ASTHMA  . COPD  . NAUSEA AND VOMITING  . IMPAIRED FASTING GLUCOSE  . PREDIABETES  . PSA, INCREASED  . ECHOCARDIOGRAM, ABNORMAL  . Tachycardia  . Neck muscle spasm  . Acute and chronic respiratory failure (acute-on-chronic)  . Hyponatremia  . Hypokalemia  . History of tobacco abuse  . History of substance abuse  . Anemia  . Drug-induced hyperglycemia    Past Medical History:  Past Medical History  Diagnosis Date  . Nicotine addiction   . COPD (chronic obstructive pulmonary disease)   . Depression   . Hyperlipidemia   . Hypertension   . CVA (cerebral vascular accident) 2008    with temporary vision loss   . Asthma   . Marijuana abuse   . History of tobacco abuse 01/14/2011  . History of substance abuse 01/14/2011    Marijuana   Past Surgical History: History reviewed. No pertinent past surgical history.  PT Assessment/Plan/Recommendation PT Assessment Clinical Impression Statement: pt with no c/o, maintaining o2 sat at 93-97% on room air with exertion...no functional abnormality was found and he should be close to baseline functiona at time of d/c PT Recommendation/Assessment: Patent does not need any further PT services No Skilled PT: Patient at baseline level of functioning PT Recommendation Follow Up Recommendations: No PT follow up Equipment Recommended: None recommended by PT PT Goals     PT Evaluation Precautions/Restrictions  Precautions Required Braces or Orthoses: No Restrictions Weight Bearing Restrictions: No Prior Functioning  Home Living Lives With: Alone Type of Home: House Home Layout: One level Home Access: Stairs to  enter Entrance Stairs-Rails: Right Entrance Stairs-Number of Steps: 3 Home Adaptive Equipment: Straight cane Prior Function Level of Independence: Independent with basic ADLs;Independent with homemaking with ambulation;Independent with gait;Independent with transfers Driving: No Vocation: Retired Producer, television/film/video: Awake/alert Overall Cognitive Status: Appears within functional limits for tasks assessed Orientation Level: Oriented X4 Sensation/Coordination Sensation Light Touch: Appears Intact Stereognosis: Not tested Hot/Cold: Not tested Proprioception: Appears Intact Coordination Gross Motor Movements are Fluid and Coordinated: Yes Fine Motor Movements are Fluid and Coordinated: Yes Extremity Assessment RUE Assessment RUE Assessment: Within Functional Limits LUE Assessment LUE Assessment: Within Functional Limits RLE Assessment RLE Assessment: Within Functional Limits LLE Assessment LLE Assessment: Within Functional Limits Mobility (including Balance) Bed Mobility Bed Mobility: Yes Supine to Sit: 7: Independent Sit to Supine: 7: Independent Transfers Transfers: Yes Sit to Stand: 7: Independent Stand to Sit: 7: Independent Ambulation/Gait Ambulation/Gait: Yes Ambulation/Gait Assistance: 7: Independent Ambulation Distance (Feet): 30 Feet (limited by ICU lines) Assistive device: None Gait Pattern: Within Functional Limits Stairs: No Wheelchair Mobility Wheelchair Mobility: No  Balance Balance Assessed: Yes High Level Balance High Level Balance Activites: Side stepping;Backward walking;Direction changes;Turns High Level Balance Comments: no balance dysfunction Exercise    End of Session PT - End of Session Equipment Utilized During Treatment: Gait belt Activity Tolerance: Patient tolerated treatment well Patient left: in chair;with call bell in reach General Behavior During Session: [facility] for tasks performed Cognition: [facility] for tasks  performed  Konrad Penta 02/21/2011, 11:36 AM

## 2011-02-22 MED ORDER — AZITHROMYCIN 250 MG PO TABS
500.0000 mg | ORAL_TABLET | Freq: Every day | ORAL | Status: DC
  Administered 2011-02-22: 500 mg via ORAL
  Filled 2011-02-22 (×2): qty 2

## 2011-02-22 NOTE — Progress Notes (Signed)
PHARMACIST - PHYSICIAN COMMUNICATION DR:   Hawkins CONCERNING: Antibiotic IV to Oral Route Change Policy  RECOMMENDATION: This patient is receiving Zithromax by the intravenous route.  Based on criteria approved by the Pharmacy and Therapeutics Committee, the antibiotic(s) is/are being converted to the equivalent oral dose form(s).  DESCRIPTION: These criteria include:  Patient being treated for a respiratory tract infection, urinary tract infection, or cellulitis  The patient is not neutropenic and does not exhibit a GI malabsorption state  The patient is eating (either orally or via tube) and/or has been taking other orally administered medications for a least 24 hours  The patient is improving clinically and has a Tmax < 100.5  If you have questions about this conversion, please contact the Pharmacy Department  [x]  ( [phone] )  [place] []  ( [phone] )  [facility]  []  ( [phone] )  [facility] []  ( [phone] )  [facility]   S. Ramez Arrona, PharmD  

## 2011-02-22 NOTE — Progress Notes (Signed)
Subjective: Feels more short of breath today, coughing  Objective: Vital signs in last 24 hours: Temp:  [98 F (36.7 C)-98.3 F (36.8 C)] 98.1 F (36.7 C) (02/15 1250) Pulse Rate:  [74-86] 78  (02/15 1250) Resp:  [18] 18  (02/15 1250) BP: (129-148)/(71-85) 129/73 mmHg (02/15 1250) SpO2:  [94 %-100 %] 99 % (02/15 1522) Weight change:  Last BM Date: 02/22/11  Intake/Output from previous day: 02/14 0701 - 02/15 0700 In: 1220 [P.O.:1220] Out: 1000 [Urine:1000] Total I/O In: 240 [P.O.:240] Out: 850 [Urine:850]   Physical Exam: General: Alert, awake, oriented x3, still using some accessory muscles to breath. HEENT: No bruits, no goiter. Heart: Regular rate and rhythm, without murmurs, rubs, gallops. Lungs: decreased breath sounds with exp wheeze b/l Abdomen: Soft, nontender, nondistended, positive bowel sounds. Extremities: No clubbing cyanosis or edema with positive pedal pulses. Neuro: Grossly intact, nonfocal.    Lab Results: Basic Metabolic Panel:  Basename 02/20/11 0518  NA 142  K 4.1  CL 108  CO2 28  GLUCOSE 149*  BUN 19  CREATININE 0.74  CALCIUM 9.3  MG --  PHOS --   Liver Function Tests: No results found for this basename: AST:2,ALT:2,ALKPHOS:2,BILITOT:2,PROT:2,ALBUMIN:2 in the last 72 hours No results found for this basename: LIPASE:2,AMYLASE:2 in the last 72 hours No results found for this basename: AMMONIA:2 in the last 72 hours CBC:  Basename 02/21/11 0429 02/20/11 0518  WBC 15.1* 15.3*  NEUTROABS -- --  HGB 11.1* 11.0*  HCT 34.1* 33.9*  MCV 93.4 93.4  PLT 196 200   Cardiac Enzymes: No results found for this basename: CKTOTAL:3,CKMB:3,CKMBINDEX:3,TROPONINI:3 in the last 72 hours BNP: No results found for this basename: PROBNP:3 in the last 72 hours D-Dimer: No results found for this basename: DDIMER:2 in the last 72 hours CBG:  Basename 02/21/11 1136 02/21/11 0750 02/20/11 1647 02/20/11 1138 02/20/11 0739 02/19/11 1911  GLUCAP 150* 133*  167* 156* 148* 138*   Hemoglobin A1C: No results found for this basename: HGBA1C in the last 72 hours Fasting Lipid Panel: No results found for this basename: CHOL,HDL,LDLCALC,TRIG,CHOLHDL,LDLDIRECT in the last 72 hours Thyroid Function Tests: No results found for this basename: TSH,T4TOTAL,FREET4,T3FREE,THYROIDAB in the last 72 hours Anemia Panel: No results found for this basename: VITAMINB12,FOLATE,FERRITIN,TIBC,IRON,RETICCTPCT in the last 72 hours Coagulation: No results found for this basename: LABPROT:2,INR:2 in the last 72 hours Urine Drug Screen: Drugs of Abuse     Component Value Date/Time   LABOPIA NONE DETECTED 02/17/2011 2236   COCAINSCRNUR NONE DETECTED 02/17/2011 2236   LABBENZ NONE DETECTED 02/17/2011 2236   AMPHETMU NONE DETECTED 02/17/2011 2236   THCU NONE DETECTED 02/17/2011 2236   LABBARB NONE DETECTED 02/17/2011 2236    Alcohol Level: No results found for this basename: ETH:2 in the last 72 hours Urinalysis: No results found for this basename: COLORURINE:2,APPERANCEUR:2,LABSPEC:2,PHURINE:2,GLUCOSEU:2,HGBUR:2,BILIRUBINUR:2,KETONESUR:2,PROTEINUR:2,UROBILINOGEN:2,NITRITE:2,LEUKOCYTESUR:2 in the last 72 hours  Recent Results (from the past 240 hour(s))  MRSA PCR SCREENING     Status: Normal   Collection Time   02/18/11  2:25 AM      Component Value Range Status Comment   MRSA by PCR NEGATIVE  NEGATIVE  Final     Studies/Results: No results found.  Medications: Scheduled Meds:   . albuterol  2.5 mg Nebulization Q4H  . antiseptic oral rinse  15 mL Mouth Rinse QID  . azithromycin  500 mg Oral Daily  . cefTRIAXone (ROCEPHIN)  IV  1 g Intravenous Q24H  . enalapril  2.5 mg Oral BID  . enoxaparin  40 mg Subcutaneous Daily  . Fluticasone-Salmeterol  1 puff Inhalation BID  . ipratropium  0.5 mg Nebulization Q4H  . pantoprazole  40 mg Oral Q1200  . predniSONE  60 mg Oral Q breakfast  . DISCONTD: azithromycin  500 mg Intravenous Q24H   Continuous Infusions:   .  propofol Stopped (02/19/11 0715)   PRN Meds:.albuterol, ipratropium, LORazepam, metoprolol  Assessment/Plan: This is a 61 y/o gentleman with severe COPD and chronic resp failure who was admitted from the emergency room with acute on chronic resp failure due to COPD which quickly required intubation and MV.  He has since been extubated and moved to a tele floor.  His pulmonologist, Dr. Juanetta Gosling is following with [place]. Principal Problem:  *Acute and chronic respiratory failure (acute-on-chronic), patient started on prednisone yesterday.  Overall he has had significant improvement.  Due to his work of breathing and ongoing wheezing, I don't feel that he is ready for discharge yet.  We will continue to give him supportive therapy and monitor progression. Patient is ambulating. I anticipate that if his breathing is improved today and he is breathing more comfortably, he can be discharged home in the morning.  Active Problems:  HYPERTENSION, stable  CHRONIC OBSTRUCTIVE PULMONARY DISEASE, ACUTE EXACERBATION, see above  Anemia, stable    LOS: 5 days   Erie Radu [facility] Pager: [phone] 02/22/2011, 3:32 PM

## 2011-02-22 NOTE — Progress Notes (Addendum)
Pt ambulated in hallway approximately 500 ft with assistance from NT. Pt tolerated well. RA oxygen saturation after ambulation 96 %. Will continue to monitor. Pt helped into chair and chair alarm in on position.

## 2011-02-22 NOTE — Progress Notes (Signed)
CARE MANAGEMENT NOTE 02/22/2011  Patient:  [name], [name]   Account Number:  [id number]  Date Initiated:  02/20/2011  Documentation initiated by:  Rosemary Holms  Subjective/Objective Assessment:   Pt admitted with respiratory distress and physically decompensated. PTA lived at home alone.     Action/Plan:   Spoke with pt at bedside. He states that someone was coming to see him Monday but he was at hospital. Previously had [facility] for [facility].   Anticipated DC Date:  02/23/2011   Anticipated DC Plan:        DC Planning Services  CM consult      Choice offered to / List presented to:          [facility] arranged  HH-1 RN  HH-10 DISEASE MANAGEMENT  HH-2 PT      HH agency  [facility]   Status of service:  In process, will continue to follow Medicare Important Message given?   (If response is "NO", the following Medicare IM given date fields will be blank) Date Medicare IM given:   Date Additional Medicare IM given:    Discharge Disposition:    Per UR Regulation:    Comments:  02/22/11 1400 Tramond Slinker RN BSN Pt requested [facility] if [facility] ordered. See below.[facility] notified that pt may be discharged tomorrow .  02/20/11 1030 Talor Desrosiers RN BSN CM [facility] has pt as a client but has not seen pt yet. [facility] notified that once DC'd pt would like to use them if [facility] is needed for COPD program. Pt to be moved to floor today. CM to follow.

## 2011-02-23 MED ORDER — PREDNISONE 20 MG PO TABS: ORAL_TABLET | ORAL | Status: DC

## 2011-02-23 MED ORDER — ENALAPRIL MALEATE 2.5 MG PO TABS: 2.5000 mg | ORAL_TABLET | Freq: Two times a day (BID) | ORAL | Status: DC

## 2011-02-23 NOTE — Discharge Summary (Signed)
Physician Discharge Summary  Patient ID: [name] [name]: [id number] DOB/AGE: 04-26-1950 61 y.o. Primary Care Physician:Ian Lodema Hong, MD, MD Admit date: 02/17/2011 Discharge date: 02/23/2011    Discharge Diagnoses:  1. Acute respiratory failure requiring intubation and mechanical ventilation secondary to exacerbation of COPD. 2. COPD. 3. Hypertension.   Medication List  As of 02/23/2011  9:00 AM   STOP taking these medications         triamterene-hydrochlorothiazide 37.5-25 MG per tablet         TAKE these medications         albuterol (2.5 MG/3ML) 0.083% nebulizer solution   Commonly known as: PROVENTIL   Take 3 mLs (2.5 mg total) by nebulization 3 (three) times daily. Use one vial every 6 to 8 hours as needed for wheezing      albuterol-ipratropium 18-103 MCG/ACT inhaler   Commonly known as: COMBIVENT   Inhale 2 puffs into the lungs every 4 (four) hours as needed for wheezing or shortness of breath.      clotrimazole-betamethasone cream   Commonly known as: LOTRISONE   Apply to affected area 2 times daily      enalapril 2.5 MG tablet   Commonly known as: VASOTEC   Take 1 tablet (2.5 mg total) by mouth 2 (two) times daily.      Fluticasone-Salmeterol 500-50 MCG/DOSE Aepb   Commonly known as: ADVAIR   Inhale 1 puff into the lungs 2 (two) times daily.      ipratropium 0.02 % nebulizer solution   Commonly known as: ATROVENT   Take 2.5 mLs (500 mcg total) by nebulization 3 (three) times daily.      predniSONE 20 MG tablet   Commonly known as: DELTASONE   Take 2 tablets daily for 3 days, then 1 tablet daily for 3 days, then half tablet daily for 3 days, then STOP.      simvastatin 40 MG tablet   Commonly known as: ZOCOR   Take 1 tablet (40 mg total) by mouth at bedtime.            Discharged Condition: Improved and stable.    Consults: Pulmonology, Dr. Juanetta [name].  Significant Diagnostic Studies: Dg Chest Portable 1 View  02/17/2011  *RADIOLOGY  REPORT*  Clinical Data: Respiratory distress.  Shortness of breath.  The endotracheal tube placement.  PORTABLE CHEST - 1 VIEW  Comparison: Chest x-ray 02/17/2011 at to 09:44 p.m.  Findings: The patient has been intubated.  Endotracheal tube terminates 4.4 cm above the carina.  The heart size is normal.  The lungs are clear.  The nodular density from the previous film is not evident.  IMPRESSION: Satisfactory positioning of the endotracheal tube.  Original Report Authenticated By: [name]. MATTERN, M.D.   Dg Chest Port 1 View  02/17/2011  *RADIOLOGY REPORT*  Clinical Data: Shortness of breath.  Respiratory distress.  PORTABLE CHEST - 1 VIEW  Comparison: 01/16/2011  Findings: Normal heart size and pulmonary vascularity.  Diffuse emphysematous changes in the lungs.  No focal airspace consolidation.  No blunting of costophrenic angles.  No pneumothorax.  Vague nodular opacity projected over the left midlung measuring 7 mm is not present on the prior study may represent a skin lesion.  IMPRESSION: Diffuse pulmonary emphysema.  No focal consolidation.  Original Report Authenticated By: [name], M.D.        CBCAlvira [name] 02/21/11 0429  WBC 15.1*  NEUTROABS --  HGB 11.1*  HCT 34.1*  MCV 93.4  PLT  196    Recent Results (from the past 240 hour(s))  MRSA PCR SCREENING     Status: Normal   Collection Time   02/18/11  2:25 AM      Component Value Range Status Comment   MRSA by PCR NEGATIVE  NEGATIVE  Final      Hospital Course: This 61 year old man was admitted once again in respiratory failure from his COPD. He decompensated very quickly with type II respiratory failure and required intubation and mechanical ventilation. He was appropriately started on intravenous steroids and antibiotics. He did well and was able to come off the ventilator. Over the last 24-36 hours he continues to improve and appears to be back to his baseline. He is not really requiring any supplemental oxygen  now. He has no wheezing or shortness of breath now. Chest x-ray did not show any evidence of pneumonia. He says he has been compliant with medications at home. I think he probably does have very severe COPD.  Discharge Exam: Blood pressure 142/77, pulse 74, temperature 97.7 F (36.5 C), temperature source Oral, resp. rate 19, height 6' (1.829 m), weight 60.5 kg (133 lb 6.1 oz), SpO2 97.00%. Today he looks systemically well. There is no increased work of breathing. There is no peripheral central cyanosis. Lung fields are entirely clear with just a few basal crackles. Heart sounds are present and normal. He is alert and orientated without any focal neurological signs.  Disposition: Home. He will have a tapering course of prednisone as before. His antihypertensive regimen has been changed so that now he is on ACE inhibitor. I've asked him to follow with his primary care physician and also he will see Dr. Juanetta [name] in about a month's time.  Discharge Orders    Future Appointments: Provider: Department: Dept Phone: Center:   03/22/2011 9:30 AM Ian Overman, MD [facility] [phone] [facility]     Future Orders Please Complete By Expires   Diet - low sodium heart healthy      Increase activity slowly         Follow-up Information    Follow up with Ian Overman, MD .         Signed: Wilson [name] Pager [phone]  02/23/2011, 9:00 AM

## 2011-02-23 NOTE — Progress Notes (Signed)
Pt discharged home today per Dr. Karilyn Cota. Pt's IV site d/c'd and WNL. Pt's VS stable at this time.  Pt provided with home medication list, discharge instructions and prescriptions. Pt verbalized understanding. Pt's wallet also returned from security. Pt left floor via WC in stable condition accompanied by RN and nursing student.

## 2011-02-25 NOTE — Progress Notes (Signed)
[name], [name] NO.:  [id number]  MEDICAL RECORD NO.:  [id number]  LOCATION:                                 FACILITY:  PHYSICIAN:  Dyan Labarbera L. Juanetta Gosling, M.D.DATE OF BIRTH:  Dec 11, 1950  DATE OF PROCEDURE:  02/22/2011 DATE OF DISCHARGE:                                PROGRESS NOTE   [name] says he feels much better.  He has been transferred to a regular room.  He has no new complaints.  PHYSICAL EXAMINATION:  GENERAL:  He is much more comfortable.  He is not using accessory muscles of respiration now. CHEST:  Decreased breath sounds, but clear. VITAL SIGNS:  His heart rates in the 70s, blood pressure 129/73, his O2 sats in the mid 90s.  ASSESSMENT:  He is much improved.  PLAN:  Continue his current treatments.  He is getting closer to discharge.     Rudi Bunyard L. Juanetta Gosling, M.D.     ELH/MEDQ  D:  02/23/2011  T:  02/24/2011  Job:  [id number]

## 2011-02-25 NOTE — Progress Notes (Signed)
NAMEBURMAN, BRUINGTON NO.:  [id number]  MEDICAL RECORD NO.:  [id number]  LOCATION:                                 FACILITY:  PHYSICIAN:  Latrell Potempa L. Juanetta Gosling, M.D.DATE OF BIRTH:  July 07, 1950  DATE OF PROCEDURE:  02/23/2011 DATE OF DISCHARGE:                                PROGRESS NOTE   Mr. Sabedra is much better and says he is ready for discharge today.  He has no new complaints and says he has been able to get up and move around.  PHYSICAL EXAMINATION:  VITAL SIGNS:  Temperature 97.7, pulse 74, respirations 19, blood pressure 142/77, O2 saturation is 97%. HEART: Regular. CHEST: Decreased breath sounds.  ASSESSMENT:  I think he is back to baseline.  PLAN:  Okay for discharge with close followup.     Erynne Kealey L. Juanetta Gosling, M.D.     ELH/MEDQ  D:  02/23/2011  T:  02/24/2011  Job:  [id number]

## 2011-03-04 ENCOUNTER — Inpatient Hospital Stay (HOSPITAL_COMMUNITY)
Admission: EM | Admit: 2011-03-04 | Discharge: 2011-03-06 | Disposition: A | Discharge status: Home / Self Care | Payer: Medicare Other | Source: Home / Self Care | Attending: Internal Medicine | Admitting: Internal Medicine

## 2011-03-04 ENCOUNTER — Emergency Department (HOSPITAL_COMMUNITY): Payer: Medicare Other

## 2011-03-04 ENCOUNTER — Encounter (HOSPITAL_COMMUNITY): Payer: Self-pay

## 2011-03-04 ENCOUNTER — Other Ambulatory Visit: Payer: Self-pay

## 2011-03-04 DIAGNOSIS — E876 Hypokalemia: Secondary | ICD-10-CM

## 2011-03-04 DIAGNOSIS — R7309 Other abnormal glucose: Secondary | ICD-10-CM

## 2011-03-04 DIAGNOSIS — J4489 Other specified chronic obstructive pulmonary disease: Secondary | ICD-10-CM

## 2011-03-04 DIAGNOSIS — D649 Anemia, unspecified: Secondary | ICD-10-CM

## 2011-03-04 DIAGNOSIS — E785 Hyperlipidemia, unspecified: Secondary | ICD-10-CM

## 2011-03-04 DIAGNOSIS — R9389 Abnormal findings on diagnostic imaging of other specified body structures: Secondary | ICD-10-CM

## 2011-03-04 DIAGNOSIS — IMO0002 Reserved for concepts with insufficient information to code with codable children: Secondary | ICD-10-CM

## 2011-03-04 DIAGNOSIS — J441 Chronic obstructive pulmonary disease with (acute) exacerbation: Principal | ICD-10-CM

## 2011-03-04 DIAGNOSIS — I10 Essential (primary) hypertension: Secondary | ICD-10-CM | POA: Diagnosis present

## 2011-03-04 DIAGNOSIS — E860 Dehydration: Secondary | ICD-10-CM

## 2011-03-04 DIAGNOSIS — F329 Major depressive disorder, single episode, unspecified: Secondary | ICD-10-CM | POA: Diagnosis present

## 2011-03-04 DIAGNOSIS — Z87891 Personal history of nicotine dependence: Secondary | ICD-10-CM

## 2011-03-04 DIAGNOSIS — T50905A Adverse effect of unspecified drugs, medicaments and biological substances, initial encounter: Secondary | ICD-10-CM

## 2011-03-04 DIAGNOSIS — J9620 Acute and chronic respiratory failure, unspecified whether with hypoxia or hypercapnia: Secondary | ICD-10-CM | POA: Diagnosis present

## 2011-03-04 DIAGNOSIS — R112 Nausea with vomiting, unspecified: Secondary | ICD-10-CM

## 2011-03-04 DIAGNOSIS — Z8673 Personal history of transient ischemic attack (TIA), and cerebral infarction without residual deficits: Secondary | ICD-10-CM

## 2011-03-04 DIAGNOSIS — J45909 Unspecified asthma, uncomplicated: Secondary | ICD-10-CM

## 2011-03-04 DIAGNOSIS — F1911 Other psychoactive substance abuse, in remission: Secondary | ICD-10-CM

## 2011-03-04 DIAGNOSIS — J449 Chronic obstructive pulmonary disease, unspecified: Secondary | ICD-10-CM

## 2011-03-04 DIAGNOSIS — R000 Tachycardia, unspecified: Secondary | ICD-10-CM

## 2011-03-04 DIAGNOSIS — F1211 Cannabis abuse, in remission: Secondary | ICD-10-CM | POA: Diagnosis present

## 2011-03-04 DIAGNOSIS — E871 Hypo-osmolality and hyponatremia: Secondary | ICD-10-CM | POA: Diagnosis present

## 2011-03-04 DIAGNOSIS — M62838 Other muscle spasm: Secondary | ICD-10-CM

## 2011-03-04 DIAGNOSIS — F3289 Other specified depressive episodes: Secondary | ICD-10-CM | POA: Diagnosis present

## 2011-03-04 DIAGNOSIS — Z79899 Other long term (current) drug therapy: Secondary | ICD-10-CM

## 2011-03-04 DIAGNOSIS — R739 Hyperglycemia, unspecified: Secondary | ICD-10-CM

## 2011-03-04 DIAGNOSIS — D72829 Elevated white blood cell count, unspecified: Secondary | ICD-10-CM | POA: Diagnosis present

## 2011-03-04 DIAGNOSIS — R7301 Impaired fasting glucose: Secondary | ICD-10-CM

## 2011-03-04 DIAGNOSIS — J189 Pneumonia, unspecified organism: Secondary | ICD-10-CM | POA: Diagnosis present

## 2011-03-04 DIAGNOSIS — T380X5A Adverse effect of glucocorticoids and synthetic analogues, initial encounter: Secondary | ICD-10-CM | POA: Diagnosis present

## 2011-03-04 DIAGNOSIS — D509 Iron deficiency anemia, unspecified: Secondary | ICD-10-CM | POA: Diagnosis not present

## 2011-03-04 DIAGNOSIS — R972 Elevated prostate specific antigen [PSA]: Secondary | ICD-10-CM

## 2011-03-04 DIAGNOSIS — E559 Vitamin D deficiency, unspecified: Secondary | ICD-10-CM

## 2011-03-04 DIAGNOSIS — E139 Other specified diabetes mellitus without complications: Secondary | ICD-10-CM | POA: Diagnosis present

## 2011-03-04 LAB — CBC
HCT: 39.6 % (ref 39.0–52.0)
Hemoglobin: 12.9 g/dL — ABNORMAL LOW (ref 13.0–17.0)
MCH: 30.6 pg (ref 26.0–34.0)
RBC: 4.22 MIL/uL (ref 4.22–5.81)

## 2011-03-04 LAB — DIFFERENTIAL
Eosinophils Absolute: 0.1 10*3/uL (ref 0.0–0.7)
Lymphs Abs: 2.2 10*3/uL (ref 0.7–4.0)
Monocytes Absolute: 0.7 10*3/uL (ref 0.1–1.0)
Monocytes Relative: 7 % (ref 3–12)
Neutrophils Relative %: 70 % (ref 43–77)

## 2011-03-04 LAB — COMPREHENSIVE METABOLIC PANEL
Alkaline Phosphatase: 61 U/L (ref 39–117)
BUN: 12 mg/dL (ref 6–23)
Chloride: 100 mEq/L (ref 96–112)
Creatinine, Ser: 0.93 mg/dL (ref 0.50–1.35)
GFR calc Af Amer: >90 mL/min (ref >90)
Glucose, Bld: 180 mg/dL — ABNORMAL HIGH (ref 70–99)
Potassium: 4.1 mEq/L (ref 3.5–5.1)
Total Bilirubin: 0.2 mg/dL — ABNORMAL LOW (ref 0.3–1.2)

## 2011-03-04 LAB — BLOOD GAS, ARTERIAL
Bicarbonate: 26.7 mEq/L — ABNORMAL HIGH (ref 20.0–24.0)
Expiratory PAP: 5
FIO2: 40 %
O2 Saturation: 98.8 %
pH, Arterial: 7.357 (ref 7.350–7.450)
pO2, Arterial: 145 mmHg — ABNORMAL HIGH (ref 80.0–100.0)

## 2011-03-04 MED ORDER — SODIUM CHLORIDE 0.9 % IV SOLN
INTRAVENOUS | Status: AC
  Administered 2011-03-05: 1000 mL via INTRAVENOUS

## 2011-03-04 MED ORDER — IPRATROPIUM BROMIDE 0.02 % IN SOLN
0.5000 mg | Freq: Once | RESPIRATORY_TRACT | Status: AC
  Administered 2011-03-04: 0.5 mg via RESPIRATORY_TRACT

## 2011-03-04 MED ORDER — METHYLPREDNISOLONE SODIUM SUCC 125 MG IJ SOLR
125.0000 mg | Freq: Once | INTRAMUSCULAR | Status: AC
  Administered 2011-03-04: 125 mg via INTRAVENOUS
  Filled 2011-03-04: qty 2

## 2011-03-04 MED ORDER — FLUTICASONE-SALMETEROL 500-50 MCG/DOSE IN AEPB
1.0000 | INHALATION_SPRAY | Freq: Two times a day (BID) | RESPIRATORY_TRACT | Status: DC
  Administered 2011-03-05 – 2011-03-06 (×3): 1 via RESPIRATORY_TRACT
  Filled 2011-03-04 (×2): qty 14

## 2011-03-04 MED ORDER — IPRATROPIUM BROMIDE 0.02 % IN SOLN
RESPIRATORY_TRACT | Status: AC
  Filled 2011-03-04: qty 2.5

## 2011-03-04 MED ORDER — ENALAPRIL MALEATE 5 MG PO TABS
2.5000 mg | ORAL_TABLET | Freq: Two times a day (BID) | ORAL | Status: DC
  Administered 2011-03-05 – 2011-03-06 (×3): 2.5 mg via ORAL
  Filled 2011-03-04 (×3): qty 1

## 2011-03-04 MED ORDER — ENOXAPARIN SODIUM 40 MG/0.4ML ~~LOC~~ SOLN
40.0000 mg | SUBCUTANEOUS | Status: DC
  Administered 2011-03-05: 40 mg via SUBCUTANEOUS
  Filled 2011-03-04: qty 0.4

## 2011-03-04 MED ORDER — ALBUTEROL SULFATE (5 MG/ML) 0.5% IN NEBU
INHALATION_SOLUTION | RESPIRATORY_TRACT | Status: AC
  Filled 2011-03-04: qty 1

## 2011-03-04 MED ORDER — METHYLPREDNISOLONE SODIUM SUCC 125 MG IJ SOLR
80.0000 mg | Freq: Two times a day (BID) | INTRAMUSCULAR | Status: DC
  Administered 2011-03-05 (×2): 80 mg via INTRAVENOUS
  Filled 2011-03-04 (×2): qty 2

## 2011-03-04 MED ORDER — ALBUTEROL SULFATE (5 MG/ML) 0.5% IN NEBU: 2.5000 mg | INHALATION_SOLUTION | Freq: Once | RESPIRATORY_TRACT | Status: DC

## 2011-03-04 MED ORDER — ALBUTEROL SULFATE (5 MG/ML) 0.5% IN NEBU
5.0000 mg | INHALATION_SOLUTION | Freq: Once | RESPIRATORY_TRACT | Status: AC
  Administered 2011-03-04: 5 mg via RESPIRATORY_TRACT

## 2011-03-04 MED ORDER — GUAIFENESIN ER 600 MG PO TB12
600.0000 mg | ORAL_TABLET | Freq: Two times a day (BID) | ORAL | Status: DC
  Administered 2011-03-05 (×3): 600 mg via ORAL
  Filled 2011-03-04 (×3): qty 1

## 2011-03-04 MED ORDER — IPRATROPIUM BROMIDE 0.02 % IN SOLN
0.5000 mg | Freq: Four times a day (QID) | RESPIRATORY_TRACT | Status: DC
  Administered 2011-03-05 (×2): 0.5 mg via RESPIRATORY_TRACT
  Filled 2011-03-04 (×2): qty 2.5

## 2011-03-04 MED ORDER — ALBUTEROL SULFATE (5 MG/ML) 0.5% IN NEBU
2.5000 mg | INHALATION_SOLUTION | RESPIRATORY_TRACT | Status: DC | PRN
  Filled 2011-03-04: qty 0.5

## 2011-03-04 MED ORDER — ALBUTEROL SULFATE (5 MG/ML) 0.5% IN NEBU
2.5000 mg | INHALATION_SOLUTION | Freq: Four times a day (QID) | RESPIRATORY_TRACT | Status: DC
  Administered 2011-03-05 (×2): 2.5 mg via RESPIRATORY_TRACT
  Filled 2011-03-04 (×2): qty 0.5

## 2011-03-04 MED ORDER — SODIUM CHLORIDE 0.9 % IV SOLN
INTRAVENOUS | Status: DC
  Administered 2011-03-04: 19:00:00 via INTRAVENOUS

## 2011-03-04 MED ORDER — SODIUM CHLORIDE 0.9 % IV BOLUS (SEPSIS)
500.0000 mL | Freq: Once | INTRAVENOUS | Status: AC
  Administered 2011-03-04: 500 mL via INTRAVENOUS

## 2011-03-04 MED ORDER — LORAZEPAM 2 MG/ML IJ SOLN
1.0000 mg | Freq: Once | INTRAMUSCULAR | Status: AC
  Administered 2011-03-04: 1 mg via INTRAVENOUS
  Filled 2011-03-04: qty 1

## 2011-03-04 NOTE — H&P (Signed)
PCP:   Syliva Overman, MD, MD   Chief Complaint:  Shortness of breath  HPI: 61 year old male who has significant COPD who was just discharged last week after being intubated for COPD exacerbation comes to the ED after acute onset of wheezing and shortness of breath he says is due to the weather. He says when the weather gets really cold he can feel the change in his lungs. He denies any fevers. He came to the ED received frequent nebulizer treatments and BiPAP and he is already back to his baseline. Denies any fever, cough, nausea, vomiting, diarrhea, chest pain, abdominal pain.  Review of Systems:  Otherwise negative  Past Medical History: Past Medical History  Diagnosis Date  . Nicotine addiction   . COPD (chronic obstructive pulmonary disease)   . Depression   . Hyperlipidemia   . Hypertension   . CVA (cerebral vascular accident) 2008    with temporary vision loss   . Asthma   . Marijuana abuse   . History of tobacco abuse 01/14/2011  . History of substance abuse 01/14/2011    Marijuana   History reviewed. No pertinent past surgical history.  Medications: Prior to Admission medications   Medication Sig Start Date End Date Taking? Authorizing Provider  albuterol (PROVENTIL) (2.5 MG/3ML) 0.083% nebulizer solution Take 3 mLs (2.5 mg total) by nebulization 3 (three) times daily. Use one vial every 6 to 8 hours as needed for wheezing 01/18/11  Yes Elliot Cousin, MD  albuterol-ipratropium (COMBIVENT) 18-103 MCG/ACT inhaler Inhale 2 puffs into the lungs every 4 (four) hours as needed for wheezing or shortness of breath. 01/18/11 01/18/12 Yes Elliot Cousin, MD  enalapril (VASOTEC) 2.5 MG tablet Take 1 tablet (2.5 mg total) by mouth 2 (two) times daily. 02/23/11 02/23/12 Yes Nimish Normajean Glasgow, MD  Fluticasone-Salmeterol (ADVAIR DISKUS) 500-50 MCG/DOSE AEPB Inhale 1 puff into the lungs 2 (two) times daily. 04/17/10 04/17/11 Yes Syliva Overman, MD  ipratropium (ATROVENT) 0.02 % nebulizer  solution Take 2.5 mLs (500 mcg total) by nebulization 3 (three) times daily. 01/18/11  Yes Elliot Cousin, MD  predniSONE (DELTASONE) 20 MG tablet Take 2 tablets daily for 3 days, then 1 tablet daily for 3 days, then half tablet daily for 3 days, then STOP. 02/23/11  Yes Nimish Normajean Glasgow, MD  simvastatin (ZOCOR) 40 MG tablet Take 40 mg by mouth daily. 10/02/10  Yes Syliva Overman, MD  clotrimazole-betamethasone (LOTRISONE) cream Apply to affected area 2 times daily 12/17/10 12/17/11  Syliva Overman, MD    Allergies:  No Known Allergies  Social History:  reports that he quit smoking about 4 months ago. His smoking use included Cigarettes. He smoked .3 packs per day. He does not have any smokeless tobacco history on file. He reports that he drinks alcohol. He reports that he uses illicit drugs (Marijuana).  Family History: Family History  Problem Relation Age of Onset  . Lung disease Sister     Physical Exam: Filed Vitals:   03/04/11 1817 03/04/11 1857 03/04/11 2040 03/04/11 2123  BP: 177/145 194/123  150/93  Pulse: 157 142 133 123  Temp: 97.7 F (36.5 C)   97.8 F (36.6 C)  TempSrc: Oral   Oral  Resp: 24  22 24   Height: 5\' 7"  (1.702 m)     Weight: 59.875 kg (132 lb)     SpO2: 97% 98% 99% 98%   BP 150/93  Pulse 105  Temp(Src) 97.8 F (36.6 C) (Oral)  Resp 21  Ht 5\' 7"  (1.702  m)  Wt 59.875 kg (132 lb)  BMI 20.67 kg/m2  SpO2 98% General appearance: alert, cooperative and no distress Lungs: Diminished bilaterally with fair air movement no wheezes rhonchi or rales Heart: regular rate and rhythm, S1, S2 normal, no murmur, click, rub or gallop Abdomen: soft, non-tender; bowel sounds normal; no masses,  no organomegaly Extremities: extremities normal, atraumatic, no cyanosis or edema Pulses: 2+ and symmetric Skin: Skin color, texture, turgor normal. No rashes or lesions Neurologic: Grossly normal    Labs on Admission:   [facility] 03/04/11 1859  NA 137  K 4.1  CL 100    CO2 30  GLUCOSE 180*  BUN 12  CREATININE 0.93  CALCIUM 9.3  MG --  PHOS --    Basename 03/04/11 1859  AST 21  ALT 28  ALKPHOS 61  BILITOT 0.2*  PROT 6.5  ALBUMIN 3.7    Basename 03/04/11 1859  WBC 10.0  NEUTROABS 7.1  HGB 12.9*  HCT 39.6  MCV 93.8  PLT 232    Radiological Exams on Admission: Dg Chest Portable 1 View  03/04/2011  *RADIOLOGY REPORT*  Clinical Data: Shortness of breath  PORTABLE CHEST - 1 VIEW  Comparison: 02/17/2011  Findings: The patient has been extubated.  There are attenuated peripheral bronchovascular markings and the lungs appear mildly hyperinflated.  Heart size is normal.  No focal infiltrate.  No effusion.  Regional bones unremarkable.  IMPRESSION:  1.  Mild hyperinflation without acute or superimposed abnormality.  Original Report Authenticated By: Osa Craver, M.D.   Dg Chest Portable 1 View  02/17/2011  *RADIOLOGY REPORT*  Clinical Data: Respiratory distress.  Shortness of breath.  The endotracheal tube placement.  PORTABLE CHEST - 1 VIEW  Comparison: Chest x-ray 02/17/2011 at to 09:44 p.m.  Findings: The patient has been intubated.  Endotracheal tube terminates 4.4 cm above the carina.  The heart size is normal.  The lungs are clear.  The nodular density from the previous film is not evident.  IMPRESSION: Satisfactory positioning of the endotracheal tube.  Original Report Authenticated By: Jamesetta Orleans. MATTERN, M.D.   Dg Chest Port 1 View  02/17/2011  *RADIOLOGY REPORT*  Clinical Data: Shortness of breath.  Respiratory distress.  PORTABLE CHEST - 1 VIEW  Comparison: 01/16/2011  Findings: Normal heart size and pulmonary vascularity.  Diffuse emphysematous changes in the lungs.  No focal airspace consolidation.  No blunting of costophrenic angles.  No pneumothorax.  Vague nodular opacity projected over the left midlung measuring 7 mm is not present on the prior study may represent a skin lesion.  IMPRESSION: Diffuse pulmonary emphysema.  No  focal consolidation.  Original Report Authenticated By: Marlon Pel, M.D.    Assessment/Plan Present on Admission:  61 year male with acute on chronic respiratory failure secondary COPD exacerbation  .History of substance abuse .CHRONIC OBSTRUCTIVE PULMONARY DISEASE, ACUTE EXACERBATION .Acute and chronic respiratory failure (acute-on-chronic) .HYPERTENSION  Place on IV Solu-Medrol chest x-ray is negative for pneumonia placed on frequent bronchodilators check urine drug screen. Provide oxygen supplementation and BiPAP as needed however he is off BiPAP right now and doing well. He quit smoking last week. However he is still around a lot of people who smoke around him and instructed him not to even be around smoked. Hold off on it abuts this time. I would ambulate patient on room air prior to discharge to see if he qualifies for home oxygen.   Farran Amsden [phone] 03/04/2011, 10:02 PM

## 2011-03-04 NOTE — ED Provider Notes (Signed)
History     CSN: [id number]  Arrival date & time 03/04/11  1814   First MD Initiated Contact with Patient 03/04/11 1834      Chief Complaint  Patient presents with  . Shortness of Breath    (Consider location/radiation/quality/duration/timing/severity/associated sxs/prior treatment) HPI This 61 year old male has a history of severe COPD and was just intubated last week and discharged 3 days ago, he states he did okay over the weekend but has been short of breath all day today despite using his albuterol nebulizer almost continuous home prior to arrival. He denies fever denies pain denies chest pain denies vomiting denies abdominal pain denies localized lateralizing weakness or numbness just has generalized weakness. He states he has not been on antibiotics, he is on steroids. He is able to only speak in one-word answers due to severe respiratory distress. Past Medical History  Diagnosis Date  . Nicotine addiction   . COPD (chronic obstructive pulmonary disease)   . Depression   . Hyperlipidemia   . Hypertension   . CVA (cerebral vascular accident) 2008    with temporary vision loss   . Asthma   . Marijuana abuse   . History of tobacco abuse 01/14/2011  . History of substance abuse 01/14/2011    Marijuana    History reviewed. No pertinent past surgical history.  Family History  Problem Relation Age of Onset  . Lung disease Sister     History  Substance Use Topics  . Smoking status: Former Smoker -- 0.3 packs/day    Types: Cigarettes    Quit date: 10/14/2010  . Smokeless tobacco: Not on file  . Alcohol Use: Yes     occasionally      Review of Systems  Unable to perform ROS: Unstable vital signs    Allergies  Review of patient's allergies indicates no known allergies.  Home Medications   No current outpatient prescriptions on file.  BP 112/53  Pulse 103  Temp(Src) 98.3 F (36.8 C) (Oral)  Resp 18  Ht 5\' 7"  (1.702 m)  Wt 126 lb 15.8 oz (57.6 kg)  BMI  19.89 kg/m2  SpO2 100%  Physical Exam  Nursing note and vitals reviewed. Constitutional: He is oriented to person, place, and time.       Awake, alert, moderately severe respiratory distress  HENT:  Head: Atraumatic.  Eyes: Right eye exhibits no discharge. Left eye exhibits no discharge.  Neck: Neck supple.  Cardiovascular: Regular rhythm.   No murmur heard.      Tachycardic  Pulmonary/Chest: He is in respiratory distress. He has wheezes. He has no rales. He exhibits no tenderness.       Diffuse retractions with accessory muscle usage present, he speaks only in one-word answers, decreased breath sounds bilaterally with expiratory wheezes  Abdominal: Soft. There is no tenderness. There is no rebound.  Musculoskeletal: He exhibits no edema and no tenderness.       Baseline ROM, no obvious new focal weakness.  Neurological: He is alert and oriented to person, place, and time.       Mental status and motor strength appears baseline for patient and situation.  Skin: No rash noted.  Psychiatric: He has a normal mood and affect.    ED Course  Procedures (including critical care time) ECG: Sinus tachycardia, ventricular rate 149, normal axis, artifact present, no acute ischemic changes noted, no significant change compared with 10Feb2013  The patient is dramatically improved and tolerating BiPAP well in the emergency department.  CRITICAL CARE Performed by: Hurman Horn   Total critical care time:  Critical care time was exclusive of separately billable procedures and treating other patients.  Critical care was necessary to treat or prevent imminent or life-threatening deterioration.  Critical care was time spent personally by me on the following activities: development of treatment plan with patient and/or surrogate as well as nursing, discussions with consultants, evaluation of patient's response to treatment, examination of patient, obtaining history from patient or  surrogate, ordering and performing treatments and interventions, ordering and review of laboratory studies, ordering and review of radiographic studies, pulse oximetry and re-evaluation of patient's condition. Labs Reviewed  CBC - Abnormal; Notable for the following:    Hemoglobin 12.9 (*)    All other components within normal limits  COMPREHENSIVE METABOLIC PANEL - Abnormal; Notable for the following:    Glucose, Bld 180 (*)    Total Bilirubin 0.2 (*)    GFR calc non Af Amer 88 (*)    All other components within normal limits  BLOOD GAS, ARTERIAL - Abnormal; Notable for the following:    pCO2 arterial 48.9 (*)    pO2, Arterial 145.0 (*)    Bicarbonate 26.7 (*)    All other components within normal limits  BASIC METABOLIC PANEL - Abnormal; Notable for the following:    Glucose, Bld 179 (*)    All other components within normal limits  CBC - Abnormal; Notable for the following:    RBC 3.91 (*)    Hemoglobin 11.9 (*)    HCT 36.4 (*)    All other components within normal limits  GLUCOSE, CAPILLARY - Abnormal; Notable for the following:    Glucose-Capillary 145 (*)    All other components within normal limits  GLUCOSE, CAPILLARY - Abnormal; Notable for the following:    Glucose-Capillary 159 (*)    All other components within normal limits  GLUCOSE, CAPILLARY - Abnormal; Notable for the following:    Glucose-Capillary 156 (*)    All other components within normal limits  PRO B NATRIURETIC PEPTIDE  DIFFERENTIAL  POCT I-STAT TROPONIN I  URINE RAPID DRUG SCREEN (HOSP PERFORMED)  MRSA PCR SCREENING  MRSA PCR SCREENING  BASIC METABOLIC PANEL  CBC   Dg Chest Portable 1 View  03/04/2011  *RADIOLOGY REPORT*  Clinical Data: Shortness of breath  PORTABLE CHEST - 1 VIEW  Comparison: 02/17/2011  Findings: The patient has been extubated.  There are attenuated peripheral bronchovascular markings and the lungs appear mildly hyperinflated.  Heart size is normal.  No focal infiltrate.  No  effusion.  Regional bones unremarkable.  IMPRESSION:  1.  Mild hyperinflation without acute or superimposed abnormality.  Original Report Authenticated By: Thora Lance III, M.D.     1. COPD exacerbation   2. Asthma with COPD       MDM  The patient appears reasonably stabilized for admission considering the current resources, flow, and capabilities available in the ED at this time, and I doubt any other [facility] requiring further screening and/or treatment in the ED prior to admission.        Hurman Horn, MD 03/05/11 [phone]

## 2011-03-04 NOTE — ED Notes (Signed)
Attempted to call report, was told the nurse would have to call me back. 

## 2011-03-04 NOTE — ED Notes (Signed)
Pt complain of being SOB that started today. States he was discharged from the hospital on Friday

## 2011-03-05 LAB — RAPID URINE DRUG SCREEN, HOSP PERFORMED
Benzodiazepines: NOT DETECTED
Cocaine: NOT DETECTED

## 2011-03-05 LAB — CBC
HCT: 36.4 % — ABNORMAL LOW (ref 39.0–52.0)
MCHC: 32.7 g/dL (ref 30.0–36.0)
MCV: 93.1 fL (ref 78.0–100.0)
RDW: 13.4 % (ref 11.5–15.5)

## 2011-03-05 LAB — GLUCOSE, CAPILLARY: Glucose-Capillary: 159 mg/dL — ABNORMAL HIGH (ref 70–99)

## 2011-03-05 LAB — BASIC METABOLIC PANEL
BUN: 15 mg/dL (ref 6–23)
CO2: 29 mEq/L (ref 19–32)
Chloride: 100 mEq/L (ref 96–112)
Creatinine, Ser: 0.74 mg/dL (ref 0.50–1.35)

## 2011-03-05 MED ORDER — IPRATROPIUM BROMIDE 0.02 % IN SOLN
0.5000 mg | RESPIRATORY_TRACT | Status: DC
  Administered 2011-03-05 – 2011-03-06 (×5): 0.5 mg via RESPIRATORY_TRACT
  Filled 2011-03-05 (×5): qty 2.5

## 2011-03-05 MED ORDER — FAMOTIDINE 20 MG PO TABS
20.0000 mg | ORAL_TABLET | Freq: Every day | ORAL | Status: DC
  Administered 2011-03-05 – 2011-03-06 (×2): 20 mg via ORAL
  Filled 2011-03-05 (×2): qty 1

## 2011-03-05 MED ORDER — INSULIN ASPART 100 UNIT/ML ~~LOC~~ SOLN
0.0000 [IU] | Freq: Three times a day (TID) | SUBCUTANEOUS | Status: DC
  Administered 2011-03-05: 3 [IU] via SUBCUTANEOUS
  Administered 2011-03-05: 4 [IU] via SUBCUTANEOUS
  Filled 2011-03-05: qty 3

## 2011-03-05 MED ORDER — INSULIN ASPART 100 UNIT/ML ~~LOC~~ SOLN
0.0000 [IU] | Freq: Every day | SUBCUTANEOUS | Status: DC
  Administered 2011-03-05: 0 [IU] via SUBCUTANEOUS

## 2011-03-05 MED ORDER — ALBUTEROL SULFATE (5 MG/ML) 0.5% IN NEBU
2.5000 mg | INHALATION_SOLUTION | RESPIRATORY_TRACT | Status: DC
  Administered 2011-03-05 – 2011-03-06 (×6): 2.5 mg via RESPIRATORY_TRACT
  Filled 2011-03-05 (×5): qty 0.5

## 2011-03-05 MED ORDER — INSULIN GLARGINE 100 UNIT/ML ~~LOC~~ SOLN
10.0000 [IU] | Freq: Every day | SUBCUTANEOUS | Status: DC
  Administered 2011-03-05: 10 [IU] via SUBCUTANEOUS
  Filled 2011-03-05: qty 3

## 2011-03-05 NOTE — Progress Notes (Signed)
Subjective: Patient says he is breathing a little bit better. He has less chest congestion.  Objective: Vital signs in last 24 hours: Filed Vitals:   03/05/11 0521 03/05/11 0600 03/05/11 0700 03/05/11 0739  BP:  103/63    Pulse: 85 78    Temp:   97.6 F (36.4 C)   TempSrc:   Axillary   Resp:  17    Height:      Weight:      SpO2: 97% 98%  100%    Intake/Output Summary (Last 24 hours) at 03/05/11 0818 Last data filed at 03/05/11 0600  Gross per 24 hour  Intake 431.25 ml  Output    600 ml  Net -168.75 ml    Weight change:   Physical exam: Lungs: A few rhonchorous wheezes bilaterally. Breathing is nonlabored. Heart: Distant S1, S2, with no murmurs rubs or gallops. Abdomen: Positive bowel sounds, soft, nontender, nondistended. Extremities: No pedal edema.  Lab Results: Basic Metabolic Panel:  Basename 03/05/11 0438 03/04/11 1859  NA 135 137  K 4.5 4.1  CL 100 100  CO2 29 30  GLUCOSE 179* 180*  BUN 15 12  CREATININE 0.74 0.93  CALCIUM 9.3 9.3  MG -- --  PHOS -- --   Liver Function Tests:  Basename 03/04/11 1859  AST 21  ALT 28  ALKPHOS 61  BILITOT 0.2*  PROT 6.5  ALBUMIN 3.7   No results found for this basename: LIPASE:2,AMYLASE:2 in the last 72 hours No results found for this basename: AMMONIA:2 in the last 72 hours CBC:  Basename 03/05/11 0438 03/04/11 1859  WBC 9.4 10.0  NEUTROABS -- 7.1  HGB 11.9* 12.9*  HCT 36.4* 39.6  MCV 93.1 93.8  PLT 180 232   Cardiac Enzymes: No results found for this basename: CKTOTAL:3,CKMB:3,CKMBINDEX:3,TROPONINI:3 in the last 72 hours BNP:  Basename 03/04/11 1859  PROBNP 6.9   D-Dimer: No results found for this basename: DDIMER:2 in the last 72 hours CBG: No results found for this basename: GLUCAP:6 in the last 72 hours Hemoglobin A1C: No results found for this basename: HGBA1C in the last 72 hours Fasting Lipid Panel: No results found for this basename: CHOL,HDL,LDLCALC,TRIG,CHOLHDL,LDLDIRECT in the last  72 hours Thyroid Function Tests: No results found for this basename: TSH,T4TOTAL,FREET4,T3FREE,THYROIDAB in the last 72 hours Anemia Panel: No results found for this basename: VITAMINB12,FOLATE,FERRITIN,TIBC,IRON,RETICCTPCT in the last 72 hours Coagulation: No results found for this basename: LABPROT:2,INR:2 in the last 72 hours Urine Drug Screen: Drugs of Abuse     Component Value Date/Time   LABOPIA NONE DETECTED 03/04/2011 2349   COCAINSCRNUR NONE DETECTED 03/04/2011 2349   LABBENZ NONE DETECTED 03/04/2011 2349   AMPHETMU NONE DETECTED 03/04/2011 2349   THCU NONE DETECTED 03/04/2011 2349   LABBARB NONE DETECTED 03/04/2011 2349    Alcohol Level: No results found for this basename: ETH:2 in the last 72 hours Urinalysis: No results found for this basename: COLORURINE:2,APPERANCEUR:2,LABSPEC:2,PHURINE:2,GLUCOSEU:2,HGBUR:2,BILIRUBINUR:2,KETONESUR:2,PROTEINUR:2,UROBILINOGEN:2,NITRITE:2,LEUKOCYTESUR:2 in the last 72 hours Misc. Labs:   Micro: Recent Results (from the past 240 hour(s))  MRSA PCR SCREENING     Status: Normal   Collection Time   03/04/11 11:42 PM      Component Value Range Status Comment   MRSA by PCR NEGATIVE  NEGATIVE  Final     Studies/Results: Dg Chest Portable 1 View  03/04/2011  *RADIOLOGY REPORT*  Clinical Data: Shortness of breath  PORTABLE CHEST - 1 VIEW  Comparison: 02/17/2011  Findings: The patient has been extubated.  There are attenuated peripheral  bronchovascular markings and the lungs appear mildly hyperinflated.  Heart size is normal.  No focal infiltrate.  No effusion.  Regional bones unremarkable.  IMPRESSION:  1.  Mild hyperinflation without acute or superimposed abnormality.  Original Report Authenticated By: Osa Craver, M.D.    Medications: I have reviewed the patient's current medications.  Assessment: Principal Problem:  *Acute and chronic respiratory failure (acute-on-chronic) Active Problems:  HYPERTENSION  CHRONIC OBSTRUCTIVE  PULMONARY DISEASE, ACUTE EXACERBATION  History of tobacco abuse  History of substance abuse  Anemia  Drug-induced hyperglycemia   Plan:  1. We'll change her bronchodilators/nebulizers to every 4 hours. Maintain Advair twice a day. 2. We'll decrease the rate of IV fluids. 3. Add sliding scale NovoLog and Lantus for steroid-induced hyperglycemia. 4. Transfer to telemetry. 5. Emphasized the need to not be exposed to secondary smoke. 6. Add prophylactic H2 blocker.   LOS: 1 day   [name] 03/05/2011, 8:18 AM

## 2011-03-05 NOTE — Progress Notes (Signed)
Pt removed from BIPAP and placed on 2L [place]. Pt tolerating well at this time.  RT will continue to monitor.

## 2011-03-06 ENCOUNTER — Emergency Department (HOSPITAL_COMMUNITY): Payer: Medicare Other

## 2011-03-06 ENCOUNTER — Encounter (HOSPITAL_COMMUNITY): Payer: Self-pay | Admitting: *Deleted

## 2011-03-06 ENCOUNTER — Other Ambulatory Visit: Payer: Self-pay

## 2011-03-06 ENCOUNTER — Inpatient Hospital Stay (HOSPITAL_COMMUNITY)
Admission: EM | Admit: 2011-03-06 | Discharge: 2011-03-08 | DRG: 190 | Disposition: A | Discharge status: Home / Self Care | Payer: Medicare Other | Attending: Internal Medicine | Admitting: Internal Medicine

## 2011-03-06 DIAGNOSIS — D649 Anemia, unspecified: Secondary | ICD-10-CM

## 2011-03-06 DIAGNOSIS — I10 Essential (primary) hypertension: Secondary | ICD-10-CM

## 2011-03-06 DIAGNOSIS — J4489 Other specified chronic obstructive pulmonary disease: Secondary | ICD-10-CM

## 2011-03-06 DIAGNOSIS — F1911 Other psychoactive substance abuse, in remission: Secondary | ICD-10-CM

## 2011-03-06 DIAGNOSIS — Z87891 Personal history of nicotine dependence: Secondary | ICD-10-CM

## 2011-03-06 DIAGNOSIS — J9620 Acute and chronic respiratory failure, unspecified whether with hypoxia or hypercapnia: Secondary | ICD-10-CM

## 2011-03-06 DIAGNOSIS — J441 Chronic obstructive pulmonary disease with (acute) exacerbation: Principal | ICD-10-CM

## 2011-03-06 DIAGNOSIS — R739 Hyperglycemia, unspecified: Secondary | ICD-10-CM

## 2011-03-06 DIAGNOSIS — R972 Elevated prostate specific antigen [PSA]: Secondary | ICD-10-CM

## 2011-03-06 DIAGNOSIS — E876 Hypokalemia: Secondary | ICD-10-CM

## 2011-03-06 DIAGNOSIS — R7309 Other abnormal glucose: Secondary | ICD-10-CM

## 2011-03-06 DIAGNOSIS — M62838 Other muscle spasm: Secondary | ICD-10-CM

## 2011-03-06 DIAGNOSIS — E559 Vitamin D deficiency, unspecified: Secondary | ICD-10-CM

## 2011-03-06 DIAGNOSIS — E860 Dehydration: Secondary | ICD-10-CM

## 2011-03-06 DIAGNOSIS — E785 Hyperlipidemia, unspecified: Secondary | ICD-10-CM

## 2011-03-06 DIAGNOSIS — R112 Nausea with vomiting, unspecified: Secondary | ICD-10-CM

## 2011-03-06 DIAGNOSIS — R000 Tachycardia, unspecified: Secondary | ICD-10-CM

## 2011-03-06 DIAGNOSIS — J449 Chronic obstructive pulmonary disease, unspecified: Secondary | ICD-10-CM

## 2011-03-06 DIAGNOSIS — R7301 Impaired fasting glucose: Secondary | ICD-10-CM

## 2011-03-06 DIAGNOSIS — J45909 Unspecified asthma, uncomplicated: Secondary | ICD-10-CM

## 2011-03-06 DIAGNOSIS — E871 Hypo-osmolality and hyponatremia: Secondary | ICD-10-CM

## 2011-03-06 DIAGNOSIS — R9389 Abnormal findings on diagnostic imaging of other specified body structures: Secondary | ICD-10-CM

## 2011-03-06 LAB — BLOOD GAS, ARTERIAL
Acid-Base Excess: 2.8 mmol/L — ABNORMAL HIGH (ref 0.0–2.0)
O2 Content: 6 L/min
pCO2 arterial: 69.5 mmHg (ref 35.0–45.0)
pO2, Arterial: 149 mmHg — ABNORMAL HIGH (ref 80.0–100.0)

## 2011-03-06 LAB — POCT I-STAT, CHEM 8
BUN: 13 mg/dL (ref 6–23)
Calcium, Ion: 1.25 mmol/L (ref 1.12–1.32)
Chloride: 106 mEq/L (ref 96–112)
HCT: 39 % (ref 39.0–52.0)
Potassium: 4.2 mEq/L (ref 3.5–5.1)
Sodium: 140 mEq/L (ref 135–145)

## 2011-03-06 LAB — CBC
Hemoglobin: 12.3 g/dL — ABNORMAL LOW (ref 13.0–17.0)
MCH: 31.1 pg (ref 26.0–34.0)
MCH: 30.8 pg (ref 26.0–34.0)
MCHC: 32.6 g/dL (ref 30.0–36.0)
MCV: 92 fL (ref 78.0–100.0)
MCV: 94.3 fL (ref 78.0–100.0)
Platelets: 170 10*3/uL (ref 150–400)
RBC: 3.63 MIL/uL — ABNORMAL LOW (ref 4.22–5.81)
RBC: 4 MIL/uL — ABNORMAL LOW (ref 4.22–5.81)
RDW: 13.4 % (ref 11.5–15.5)
WBC: 12 10*3/uL — ABNORMAL HIGH (ref 4.0–10.5)

## 2011-03-06 LAB — BASIC METABOLIC PANEL
Calcium: 9.2 mg/dL (ref 8.4–10.5)
Creatinine, Ser: 0.68 mg/dL (ref 0.50–1.35)
GFR calc non Af Amer: >90 mL/min (ref >90)
Sodium: 137 mEq/L (ref 135–145)

## 2011-03-06 LAB — CARDIAC PANEL(CRET KIN+CKTOT+MB+TROPI)
CK, MB: 4.2 ng/mL — ABNORMAL HIGH (ref 0.3–4.0)
Relative Index: 3.3 — ABNORMAL HIGH (ref 0.0–2.5)
Total CK: 129 U/L (ref 7–232)
Troponin I: <0.3 ng/mL (ref <0.30)

## 2011-03-06 LAB — POCT I-STAT TROPONIN I: Troponin i, poc: 0 ng/mL (ref 0.00–0.08)

## 2011-03-06 LAB — DIFFERENTIAL
Basophils Relative: 0 % (ref 0–1)
Eosinophils Absolute: 0 10*3/uL (ref 0.0–0.7)
Eosinophils Relative: 0 % (ref 0–5)
Lymphs Abs: 5.4 10*3/uL — ABNORMAL HIGH (ref 0.7–4.0)
Monocytes Absolute: 1.5 10*3/uL — ABNORMAL HIGH (ref 0.1–1.0)
Monocytes Relative: 6 % (ref 3–12)
Neutrophils Relative %: 71 % (ref 43–77)

## 2011-03-06 LAB — GLUCOSE, CAPILLARY

## 2011-03-06 MED ORDER — IPRATROPIUM-ALBUTEROL 18-103 MCG/ACT IN AERO
2.0000 | INHALATION_SPRAY | RESPIRATORY_TRACT | Status: DC | PRN
  Filled 2011-03-06: qty 14.7

## 2011-03-06 MED ORDER — ALBUTEROL (5 MG/ML) CONTINUOUS INHALATION SOLN: 10.0000 mg/h | INHALATION_SOLUTION | RESPIRATORY_TRACT | Status: DC

## 2011-03-06 MED ORDER — PREDNISONE 10 MG PO TABS: ORAL_TABLET | ORAL | Status: DC

## 2011-03-06 MED ORDER — LORAZEPAM 2 MG/ML IJ SOLN
1.0000 mg | Freq: Once | INTRAMUSCULAR | Status: AC
  Administered 2011-03-06: 1 mg via INTRAVENOUS

## 2011-03-06 MED ORDER — ONDANSETRON HCL 4 MG/2ML IJ SOLN: 4.0000 mg | Freq: Four times a day (QID) | INTRAMUSCULAR | Status: DC | PRN

## 2011-03-06 MED ORDER — IOHEXOL 350 MG/ML SOLN
100.0000 mL | Freq: Once | INTRAVENOUS | Status: AC | PRN
  Administered 2011-03-06: 100 mL via INTRAVENOUS

## 2011-03-06 MED ORDER — LORAZEPAM 2 MG/ML IJ SOLN
1.0000 mg | Freq: Once | INTRAMUSCULAR | Status: AC
  Administered 2011-03-06: 1 mg via INTRAVENOUS
  Filled 2011-03-06: qty 1

## 2011-03-06 MED ORDER — ALBUTEROL SULFATE (5 MG/ML) 0.5% IN NEBU
2.5000 mg | INHALATION_SOLUTION | RESPIRATORY_TRACT | Status: DC | PRN
  Administered 2011-03-08: 2.5 mg via RESPIRATORY_TRACT
  Filled 2011-03-06: qty 0.5

## 2011-03-06 MED ORDER — ONDANSETRON HCL 4 MG PO TABS: 4.0000 mg | ORAL_TABLET | Freq: Four times a day (QID) | ORAL | Status: DC | PRN

## 2011-03-06 MED ORDER — DEXTROSE 5 % IV SOLN
1.0000 g | INTRAVENOUS | Status: DC
  Administered 2011-03-07: 1 g via INTRAVENOUS
  Filled 2011-03-06 (×4): qty 10

## 2011-03-06 MED ORDER — CLOTRIMAZOLE 1 % EX CREA
TOPICAL_CREAM | Freq: Two times a day (BID) | CUTANEOUS | Status: DC
  Filled 2011-03-06: qty 15

## 2011-03-06 MED ORDER — FAMOTIDINE 20 MG PO TABS
20.0000 mg | ORAL_TABLET | Freq: Every day | ORAL | Status: DC
  Administered 2011-03-07 – 2011-03-08 (×2): 20 mg via ORAL
  Filled 2011-03-06 (×2): qty 1

## 2011-03-06 MED ORDER — SODIUM CHLORIDE 0.9 % IN NEBU
INHALATION_SOLUTION | RESPIRATORY_TRACT | Status: AC
  Filled 2011-03-06: qty 3

## 2011-03-06 MED ORDER — SODIUM CHLORIDE 0.9 % IV SOLN
INTRAVENOUS | Status: DC
  Administered 2011-03-06: 1000 mL via INTRAVENOUS

## 2011-03-06 MED ORDER — IPRATROPIUM BROMIDE 0.02 % IN SOLN
500.0000 ug | Freq: Three times a day (TID) | RESPIRATORY_TRACT | Status: DC
  Administered 2011-03-06 – 2011-03-08 (×5): 500 ug via RESPIRATORY_TRACT
  Filled 2011-03-06 (×5): qty 2.5

## 2011-03-06 MED ORDER — LORAZEPAM 2 MG/ML IJ SOLN
INTRAMUSCULAR | Status: AC
  Administered 2011-03-06: 1 mg via INTRAVENOUS
  Filled 2011-03-06: qty 1

## 2011-03-06 MED ORDER — SODIUM CHLORIDE 0.9 % IJ SOLN
3.0000 mL | Freq: Two times a day (BID) | INTRAMUSCULAR | Status: DC
  Administered 2011-03-06 – 2011-03-07 (×2): 3 mL via INTRAVENOUS
  Filled 2011-03-06 (×2): qty 3

## 2011-03-06 MED ORDER — FLUTICASONE-SALMETEROL 500-50 MCG/DOSE IN AEPB: 1.0000 | INHALATION_SPRAY | Freq: Two times a day (BID) | RESPIRATORY_TRACT | Status: DC

## 2011-03-06 MED ORDER — SIMVASTATIN 20 MG PO TABS
40.0000 mg | ORAL_TABLET | Freq: Every day | ORAL | Status: DC
  Administered 2011-03-07: 40 mg via ORAL
  Filled 2011-03-06: qty 1
  Filled 2011-03-06: qty 2
  Filled 2011-03-06: qty 1

## 2011-03-06 MED ORDER — FAMOTIDINE 20 MG PO TABS: 20.0000 mg | ORAL_TABLET | Freq: Every day | ORAL | Status: DC

## 2011-03-06 MED ORDER — ALBUTEROL (5 MG/ML) CONTINUOUS INHALATION SOLN
INHALATION_SOLUTION | RESPIRATORY_TRACT | Status: AC
  Filled 2011-03-06: qty 20

## 2011-03-06 MED ORDER — HEPARIN SODIUM (PORCINE) 5000 UNIT/ML IJ SOLN
5000.0000 [IU] | Freq: Three times a day (TID) | INTRAMUSCULAR | Status: DC
  Administered 2011-03-06 – 2011-03-08 (×5): 5000 [IU] via SUBCUTANEOUS
  Filled 2011-03-06 (×11): qty 1

## 2011-03-06 MED ORDER — ALBUTEROL SULFATE (2.5 MG/3ML) 0.083% IN NEBU: INHALATION_SOLUTION | RESPIRATORY_TRACT | Status: DC

## 2011-03-06 MED ORDER — ALBUTEROL SULFATE (5 MG/ML) 0.5% IN NEBU
2.5000 mg | INHALATION_SOLUTION | RESPIRATORY_TRACT | Status: DC
  Administered 2011-03-06 – 2011-03-07 (×6): 2.5 mg via RESPIRATORY_TRACT
  Filled 2011-03-06 (×6): qty 0.5

## 2011-03-06 MED ORDER — DEXTROSE 5 % IV SOLN
500.0000 mg | INTRAVENOUS | Status: DC
  Administered 2011-03-07: 500 mg via INTRAVENOUS
  Filled 2011-03-06 (×4): qty 500

## 2011-03-06 MED ORDER — ENALAPRIL MALEATE 5 MG PO TABS
2.5000 mg | ORAL_TABLET | Freq: Two times a day (BID) | ORAL | Status: DC
  Administered 2011-03-06 – 2011-03-08 (×4): 2.5 mg via ORAL
  Filled 2011-03-06 (×8): qty 1

## 2011-03-06 MED ORDER — LORAZEPAM 2 MG/ML IJ SOLN
INTRAMUSCULAR | Status: AC
  Filled 2011-03-06: qty 1

## 2011-03-06 MED ORDER — METHYLPREDNISOLONE SODIUM SUCC 125 MG IJ SOLR
INTRAMUSCULAR | Status: AC
  Filled 2011-03-06: qty 2

## 2011-03-06 MED ORDER — METHYLPREDNISOLONE SODIUM SUCC 125 MG IJ SOLR
125.0000 mg | Freq: Four times a day (QID) | INTRAMUSCULAR | Status: DC
  Administered 2011-03-07 (×2): 125 mg via INTRAVENOUS
  Filled 2011-03-06 (×2): qty 2

## 2011-03-06 MED ORDER — METHYLPREDNISOLONE SODIUM SUCC 125 MG IJ SOLR
125.0000 mg | Freq: Once | INTRAMUSCULAR | Status: AC
  Administered 2011-03-06: 125 mg via INTRAVENOUS

## 2011-03-06 NOTE — ED Notes (Signed)
Pt arrives from home d/t resp distress. Pt is unable to answer questions at this time. Pt is alert to people.

## 2011-03-06 NOTE — ED Notes (Signed)
Attempted to call report to ICU primary RN not available to take report at this time.

## 2011-03-06 NOTE — Consult Note (Signed)
Consult requested by: Dr. Karilyn Cota Consult requested for respiratory failure:  HPI: This is a 61 year old who has had multiple hospitalizations for respiratory distress and has been intubated and placed on mechanical ventilation on multiple occasions. He apparently had a recent admission for COPD exacerbation and was discharged yesterday. He has come back and now with increased shortness of breath cough congestion and wheezing. He is on BiPAP but still in some distress. We are trying to avoid intubating him because he generally improves fairly rapidly once he gets on full course of treatment.  Past Medical History  Diagnosis Date  . Nicotine addiction   . COPD (chronic obstructive pulmonary disease)   . Depression   . Hyperlipidemia   . Hypertension   . CVA (cerebral vascular accident) 2008    with temporary vision loss   . Asthma   . Marijuana abuse   . History of tobacco abuse 01/14/2011  . History of substance abuse 01/14/2011    Marijuana     Family History  Problem Relation Age of Onset  . Lung disease Sister      History   Social History  . Marital Status: Widowed    Spouse Name: N/A    Number of Children: 1  . Years of Education: N/A   Occupational History  . unemployed     Social History Main Topics  . Smoking status: Former Smoker -- 0.3 packs/day    Types: Cigarettes    Quit date: 10/14/2010  . Smokeless tobacco: None  . Alcohol Use: Yes     occasionally  . Drug Use: Yes    Special: Marijuana  . Sexually Active: None   Other Topics Concern  . None   Social History Narrative  . None     ROS: Unobtainabl    Objective: Vital signs in last 24 hours: Temp:  [98.5 F (36.9 C)] 98.5 F (36.9 C) (02/27 1649) Pulse Rate:  [64-167] 156  (02/27 1730) Resp:  [13-30] 24  (02/27 1730) BP: (92-184)/(50-149) 170/89 mmHg (02/27 1730) SpO2:  [92 %-100 %] 99 % (02/27 1730) Weight:  [68.04 kg (150 lb)] 68.04 kg (150 lb) (02/27 1649) Weight change:      Intake/Output from previous day:    PHYSICAL EXAM He is awake and responsive. His heart rate about 140 but this is after a nebulizer treatment. His pupils are reactive. He is on BiPAP. He has diminished breath sounds with end expiratory wheezes. His heart is regular with a tachycardia. His abdomen is soft without masses. He does not have any edema of the extremities. His central nervous system examination is grossly intact  Lab Results: Basic Metabolic Panel:  Basename 03/06/11 1706 03/06/11 0414 03/05/11 0438  NA 140 137 --  K 4.2 4.2 --  CL 106 101 --  CO2 -- 30 29  GLUCOSE 159* 133* --  BUN 13 14 --  CREATININE 0.90 0.68 --  CALCIUM -- 9.2 9.3  MG -- -- --  PHOS -- -- --   Liver Function Tests:  Basename 03/04/11 1859  AST 21  ALT 28  ALKPHOS 61  BILITOT 0.2*  PROT 6.5  ALBUMIN 3.7   No results found for this basename: LIPASE:2,AMYLASE:2 in the last 72 hours No results found for this basename: AMMONIA:2 in the last 72 hours CBC:  Basename 03/06/11 1706 03/06/11 1700 03/06/11 0414 03/04/11 1859  WBC -- 23.7* 12.0* --  NEUTROABS -- 16.8* -- 7.1  HGB 13.3 12.3* -- --  HCT 39.0 37.7* -- --  MCV -- 94.3 92.0 --  PLT -- 223 170 --   Cardiac Enzymes:  Basename 03/06/11 1706  CKTOTAL 129  CKMB 4.2*  CKMBINDEX --  TROPONINI <0.30   BNP:  Basename 03/04/11 1859  PROBNP 6.9   D-Dimer: No results found for this basename: DDIMER:2 in the last 72 hours CBG:  Basename 03/06/11 0737 03/05/11 2246 03/05/11 1655 03/05/11 1203  GLUCAP 106* 156* 159* 145*   Hemoglobin A1C: No results found for this basename: HGBA1C in the last 72 hours Fasting Lipid Panel: No results found for this basename: CHOL,HDL,LDLCALC,TRIG,CHOLHDL,LDLDIRECT in the last 72 hours Thyroid Function Tests: No results found for this basename: TSH,T4TOTAL,FREET4,T3FREE,THYROIDAB in the last 72 hours Anemia Panel: No results found for this basename:  VITAMINB12,FOLATE,FERRITIN,TIBC,IRON,RETICCTPCT in the last 72 hours Coagulation: No results found for this basename: LABPROT:2,INR:2 in the last 72 hours Urine Drug Screen: Drugs of Abuse     Component Value Date/Time   LABOPIA NONE DETECTED 03/04/2011 2349   COCAINSCRNUR NONE DETECTED 03/04/2011 2349   LABBENZ NONE DETECTED 03/04/2011 2349   AMPHETMU NONE DETECTED 03/04/2011 2349   THCU NONE DETECTED 03/04/2011 2349   LABBARB NONE DETECTED 03/04/2011 2349    Alcohol Level: No results found for this basename: ETH:2 in the last 72 hours Urinalysis: No results found for this basename: COLORURINE:2,APPERANCEUR:2,LABSPEC:2,PHURINE:2,GLUCOSEU:2,HGBUR:2,BILIRUBINUR:2,KETONESUR:2,PROTEINUR:2,UROBILINOGEN:2,NITRITE:2,LEUKOCYTESUR:2 in the last 72 hours Misc. Labs:   ABGS:  Basename 03/06/11 1730  PHART 7.250*  PO2ART 149.0*  TCO2 27.3  HCO3 29.4*     MICROBIOLOGY: Recent Results (from the past 240 hour(s))  MRSA PCR SCREENING     Status: Normal   Collection Time   03/04/11 11:42 PM      Component Value Range Status Comment   MRSA by PCR NEGATIVE  NEGATIVE  Final     Studies/Results: Dg Chest Portable 1 View  03/06/2011  *RADIOLOGY REPORT*  Clinical Data: Respiratory distress  PORTABLE CHEST - 1 VIEW  Comparison: Portable exam 1705 hours compared to 03/04/2011  Findings: Upper-normal size of cardiac silhouette. Mediastinal contours and pulmonary vascularity normal. Emphysematous and minimal bronchitic changes. No pulmonary infiltrate, pleural effusion or pneumothorax. Bones appear demineralized.  IMPRESSION: Emphysematous and minimal bronchitic changes. No acute infiltrate.  Original Report Authenticated By: Lollie Marrow, M.D.    Medications:  Prior to Admission:  (Not in a hospital admission) Scheduled:   . albuterol  2.5 mg Nebulization Q4H  . azithromycin  500 mg Intravenous Q24H  . clotrimazole   Topical BID  . enalapril  2.5 mg Oral BID  . famotidine  20 mg Oral Daily  .  heparin  5,000 Units Subcutaneous Q8H  . ipratropium  500 mcg Nebulization TID  . LORazepam  1 mg Intravenous Once  . LORazepam  1 mg Intravenous Once  . LORazepam  1 mg Intravenous Once  . methylPREDNISolone sodium succinate  125 mg Intravenous Once  . methylPREDNISolone (SOLU-MEDROL) injection  125 mg Intravenous Q6H  . simvastatin  40 mg Oral q1800  . sodium chloride  3 mL Intravenous Q12H  . DISCONTD: Fluticasone-Salmeterol  1 puff Inhalation BID   Continuous:   . sodium chloride 1,000 mL (03/06/11 1654)  . cefTRIAXone (ROCEPHIN)  IV    . DISCONTD: albuterol     ZOX:[id number], albuterol-ipratropium, ondansetron (ZOFRAN) IV, ondansetron  Assesment: He has acute respiratory failure. He has acute exacerbation of COPD. He is on BiPAP and were trying to avoid having to intubate him but it may come to that. Dr. Karilyn Cota has ordered a CT  of the chest to rule out pulmonary embolus which I think is appropriate but we may not be able to get it done until he has somewhat less respiratory distress Active Problems:  CHRONIC OBSTRUCTIVE PULMONARY DISEASE, ACUTE EXACERBATION  History of tobacco abuse    Plan: He'll be on IV steroids IV antibiotics inhaled bronchodilators.  Thanks for allow me to see him with you    LOS: 0 days   Carlyne Keehan L 03/06/2011, 6:36 PM

## 2011-03-06 NOTE — ED Provider Notes (Signed)
This chart was scribed for Ian Singer, MD by Williemae Natter. The patient was seen in room IC08/IC08-01 at 4:40 PM.  CSN: [id number]  Arrival date & time 03/06/11  1644   First MD Initiated Contact with Patient 03/06/11 1643      Chief Complaint  Patient presents with  . Respiratory Distress    (Consider location/radiation/quality/duration/timing/severity/associated sxs/prior treatment) Patient is a 61 y.o. male presenting with shortness of breath. The history is provided by the patient and the EMS personnel.  Shortness of Breath  The current episode started today. The onset was sudden. The problem occurs occasionally. The problem has been gradually improving. The problem is severe. Associated symptoms include shortness of breath and wheezing. Pertinent negatives include no chest pain. Recently, medical care has been given at this facility and by EMS. Services received include medications given and tests performed.   Level 5 Caveat- Pt in respiratory distress  [name] is a 61 y.o. male who presents to the Emergency Department complaining of respiratory distress. Pt was discharged yesterday from ICU, admitted for similar symptoms. Pt denies any chest pain but is having trouble breathing. Hx of asthma O2 level at 78% per EMS pta now at 97%.  Past Medical History  Diagnosis Date  . Nicotine addiction   . COPD (chronic obstructive pulmonary disease)   . Depression   . Hyperlipidemia   . Hypertension   . CVA (cerebral vascular accident) 2008    with temporary vision loss   . Asthma   . Marijuana abuse   . History of tobacco abuse 01/14/2011  . History of substance abuse 01/14/2011    Marijuana    History reviewed. No pertinent past surgical history.  Family History  Problem Relation Age of Onset  . Lung disease Sister     History  Substance Use Topics  . Smoking status: Former Smoker -- 0.3 packs/day    Types: Cigarettes    Quit date: 10/14/2010  . Smokeless  tobacco: Not on file  . Alcohol Use: Yes     occasionally      Review of Systems  Unable to perform ROS: Other  Respiratory: Positive for shortness of breath and wheezing.   Cardiovascular: Negative for chest pain.    Allergies  Review of patient's allergies indicates no known allergies.  Home Medications   No current outpatient prescriptions on file.  BP 127/87  Pulse 90  Temp(Src) 97.6 F (36.4 C) (Oral)  Resp 21  Ht 5\' 7"  (1.702 m)  Wt 128 lb 1.4 oz (58.1 kg)  BMI 20.06 kg/m2  SpO2 98%  Physical Exam  Nursing note and vitals reviewed. Constitutional: He appears well-developed and well-nourished.  HENT:  Head: Normocephalic and atraumatic.  Eyes: EOM are normal. Pupils are equal, round, and reactive to light.  Neck: Normal range of motion. Neck supple.  Cardiovascular: Normal heart sounds.  Tachycardia present.   Pulmonary/Chest: He is in respiratory distress. He has rhonchi. He has rales.       Prolonged expirations Short grunting sentences   Abdominal: Soft. There is no tenderness.  Neurological: He is alert.  Skin: Skin is warm and dry.    ED Course  Procedures (including critical care time) DIAGNOSTIC STUDIES: Oxygen Saturation is 95% on aerosol mask, normal by my interpretation.    COORDINATION OF CARE:  Medications  0.9 %  sodium chloride infusion (  Intravenous Rate/Dose Verify 03/06/11 2300)  clotrimazole (LOTRIMIN) 1 % cream ( application Topical Not Given 03/06/11  2245)  albuterol-ipratropium (COMBIVENT) inhaler 2 puff (not administered)  ipratropium (ATROVENT) nebulizer solution 500 mcg (  Nebulization Canceled Entry 03/06/11 2200)  enalapril (VASOTEC) tablet 2.5 mg (2.5 mg Oral Given 03/06/11 2245)  simvastatin (ZOCOR) tablet 40 mg (not administered)  albuterol (PROVENTIL) (5 MG/ML) 0.5% nebulizer solution 2.5 mg (2.5 mg Nebulization Given 03/06/11 2322)  famotidine (PEPCID) tablet 20 mg (not administered)  heparin injection 5,000 Units (5000  Units Subcutaneous Given 03/06/11 2245)  sodium chloride 0.9 % injection 3 mL (3 mL Intravenous Given 03/06/11 2246)  ondansetron (ZOFRAN) tablet 4 mg (not administered)    Or  ondansetron (ZOFRAN) injection 4 mg (not administered)  albuterol (PROVENTIL) (5 MG/ML) 0.5% nebulizer solution 2.5 mg (not administered)  methylPREDNISolone sodium succinate (SOLU-MEDROL) 125 MG injection 125 mg (not administered)  cefTRIAXone (ROCEPHIN) 1 g in dextrose 5 % 50 mL IVPB (not administered)  azithromycin (ZITHROMAX) 500 mg in dextrose 5 % 250 mL IVPB (not administered)  methylPREDNISolone sodium succinate (SOLU-MEDROL) 125 MG injection 125 mg (125 mg Intravenous Given 03/06/11 1654)  LORazepam (ATIVAN) injection 1 mg (1 mg Intravenous Given 03/06/11 1654)  LORazepam (ATIVAN) injection 1 mg (1 mg Intravenous Given 03/06/11 1713)  LORazepam (ATIVAN) injection 1 mg (1 mg Intravenous Given 03/06/11 1825)  iohexol (OMNIPAQUE) 350 MG/ML injection 100 mL (100 mL Intravenous Contrast Given 03/06/11 1845)      Labs Reviewed  CBC - Abnormal; Notable for the following:    WBC 23.7 (*)    RBC 4.00 (*)    Hemoglobin 12.3 (*)    HCT 37.7 (*)    All other components within normal limits  DIFFERENTIAL - Abnormal; Notable for the following:    Neutro Abs 16.8 (*)    Lymphs Abs 5.4 (*)    Monocytes Absolute 1.5 (*)    All other components within normal limits  POCT I-STAT, CHEM 8 - Abnormal; Notable for the following:    Glucose, Bld 159 (*)    All other components within normal limits  BLOOD GAS, ARTERIAL - Abnormal; Notable for the following:    pH, Arterial 7.250 (*)    pCO2 arterial 69.5 (*)    pO2, Arterial 149.0 (*)    Bicarbonate 29.4 (*)    Acid-Base Excess 2.8 (*)    All other components within normal limits  CARDIAC PANEL(CRET KIN+CKTOT+MB+TROPI) - Abnormal; Notable for the following:    CK, MB 4.2 (*)    Relative Index 3.3 (*)    All other components within normal limits  POCT I-STAT TROPONIN I    MRSA PCR SCREENING  CARDIAC PANEL(CRET KIN+CKTOT+MB+TROPI)  COMPREHENSIVE METABOLIC PANEL  CBC  DRUGS OF ABUSE SCREEN W/O ALC, ROUTINE URINE  CARDIAC PANEL(CRET KIN+CKTOT+MB+TROPI)   Ct Angio Chest W/cm &/or Wo Cm  03/06/2011  *RADIOLOGY REPORT*  Clinical Data: 61 year old male with sudden onset shortness of breath, wheezing.  CT ANGIOGRAPHY CHEST  Technique:  Multidetector CT imaging of the chest using the standard protocol during bolus administration of intravenous contrast. Multiplanar reconstructed images including MIPs were obtained and reviewed to evaluate the vascular anatomy.  Contrast: OMNIPAQUE IOHEXOL 350 MG/ML IV SOLN  Comparison: Chest CT 11/05/2010 and earlier.  Findings: Adequate contrast bolus timing in the pulmonary arterial tree.  Lower lung respiratory motion artifact.  Upper lobe perihilar respiratory motion also noted. No focal filling defect identified in the pulmonary arterial tree to suggest the presence of acute pulmonary embolism.  Layering secretions in the trachea (series 6 image 27). Atelectatic changes to  the airway is intermittently noted.  Lower lobe bronchiectasis.  Peripheral nodular airspace opacity in the left lower lobe.  No consolidation.  Right upper lobe central lobular and paraseptal emphysema.  Tiny calcified right upper lobe granuloma is unchanged.  Right lung base bullous emphysema.  Tiny right lower lobe pulmonary nodule (series 6 image 79) is stable since 2011.  No pericardial or pleural effusion.  Stable and negative visualized upper abdominal viscera.  No mediastinal lymphadenopathy.  Negative thoracic inlet.  No acute osseous abnormality identified.  IMPRESSION: 1. No evidence of acute pulmonary embolus. 2.  Chronic lung disease including bronchiectasis with superimposed acute left lower lobe lung infection.  Retained secretions in the trachea.  Original Report Authenticated By: Harley Hallmark, M.D.   Dg Chest Portable 1 View  03/06/2011  *RADIOLOGY  REPORT*  Clinical Data: Respiratory distress  PORTABLE CHEST - 1 VIEW  Comparison: Portable exam 1705 hours compared to 03/04/2011  Findings: Upper-normal size of cardiac silhouette. Mediastinal contours and pulmonary vascularity normal. Emphysematous and minimal bronchitic changes. No pulmonary infiltrate, pleural effusion or pneumothorax. Bones appear demineralized.  IMPRESSION: Emphysematous and minimal bronchitic changes. No acute infiltrate.  Original Report Authenticated By: Lollie Marrow, M.D.     1. COPD (chronic obstructive pulmonary disease)   2. Asthma with COPD     Date: 03/07/2011  Rate: 156  Rhythm: sinus tachycardia  QRS Axis: normal  Intervals: normal  ST/T Wave abnormalities: normal  Conduction Disutrbances:none  Narrative Interpretation:   Old EKG Reviewed: unchanged  CRITICAL CARE Performed by: Benjiman Core R.   30 minutes  Critical care time was exclusive of separately billable procedures and treating other patients.  Critical care was necessary to treat or prevent imminent or life-threatening deterioration.  Critical care was time spent personally by me on the following activities: development of treatment plan with patient and/or surrogate as well as nursing, discussions with consultants, evaluation of patient's response to treatment, examination of patient, obtaining history from patient or surrogate, ordering and performing treatments and interventions, ordering and review of laboratory studies, ordering and review of radiographic studies, pulse oximetry and re-evaluation of patient's condition.   MDM  Patient presented with shortness of breath. He did pulse ox is a 78% for EMS. He continued to be tachycardic here. In some respiratory distress and anxiety with it. X-ray was stable. He was discharged from the hospital earlier today. CT angio did not show a pulmonary embolism. He was admitted to [facility] I personally performed the services described in this  documentation, which was scribed in my presence. The recorded information has been reviewed and considered.          Juliet Rude. Rubin Payor, MD 03/07/11 0002

## 2011-03-06 NOTE — H&P (Signed)
[name] [name]: [id number] DOB/AGE: [birth year] 63 y.o. Primary Care Physician:Margaret Lodema Hong, MD, MD Admit date: 03/06/2011 Chief Complaint: Dyspnea. HPI: This 61 year old man, who has had several admissions in the last couple of months to this hospital and he was only discharged from the hospital today in stable condition presents again with severe dyspnea, inability to talk and in respiratory failure. Blood gases showed his PCO2 is 69.5, pH 7.25. He is currently in the process of having BiPAP.  Past Medical History  Diagnosis Date  . Nicotine addiction   . COPD (chronic obstructive pulmonary disease)   . Depression   . Hyperlipidemia   . Hypertension   . CVA (cerebral vascular accident) 2008    with temporary vision loss   . Asthma   . Marijuana abuse   . History of tobacco abuse 01/14/2011  . History of substance abuse 01/14/2011    Marijuana         Family History  Problem Relation Age of Onset  . Lung disease Sister     Social History:  reports that he quit smoking about 4 months ago. His smoking use included Cigarettes. He smoked .3 packs per day. He does not have any smokeless tobacco history on file. He reports that he drinks alcohol. He reports that he uses illicit drugs (Marijuana).   Allergies: No Known Allergies  Medications Prior to Admission  Medication Dose Route Frequency Provider Last Rate Last Dose  . 0.9 %  sodium chloride infusion   Intravenous Continuous Elliot Cousin, MD 20 mL/hr at 03/05/11 0900    . 0.9 %  sodium chloride infusion   Intravenous Continuous Juliet Rude. Pickering, MD 125 mL/hr at 03/06/11 1654 1,000 mL at 03/06/11 1654  . albuterol (PROVENTIL) (5 MG/ML) 0.5% nebulizer solution 2.5 mg  2.5 mg Nebulization Q4H Lorin Gawron C [place] Stenberg, MD      . albuterol (PROVENTIL) (5 MG/ML) 0.5% nebulizer solution 2.5 mg  2.5 mg Nebulization Q2H PRN Cavan Bearden C Ixchel Duck, MD      . albuterol-ipratropium (COMBIVENT) inhaler 2 puff  2 puff Inhalation Q4H PRN Abas Leicht C  Melida Northington, MD      . azithromycin (ZITHROMAX) 500 mg in dextrose 5 % 250 mL IVPB  500 mg Intravenous Q24H Rishawn Walck C Saksham Akkerman, MD      . cefTRIAXone (ROCEPHIN) 1 g in dextrose 5 % 50 mL IVPB  1 g Intravenous Q24H Dane Kopke C Morna Flud, MD      . clotrimazole (LOTRIMIN) 1 % cream   Topical BID Phat Arizmendi C Sparrow [place], MD      . enalapril (VASOTEC) tablet 2.5 mg  2.5 mg Oral BID Kemari Mares Normajean Glasgow, MD      . famotidine (PEPCID) tablet 20 mg  20 mg Oral Daily Daiveon Markman C Brooklyn Alfredo, MD      . heparin injection 5,000 Units  5,000 Units Subcutaneous Q8H Bradshaw Minihan C Emy Angevine, MD      . ipratropium (ATROVENT) nebulizer solution 500 mcg  500 mcg Nebulization TID Dequincy Born C Noboru Bidinger, MD      . LORazepam (ATIVAN) injection 1 mg  1 mg Intravenous Once Nathan R. Pickering, MD   1 mg at 03/06/11 1654  . LORazepam (ATIVAN) injection 1 mg  1 mg Intravenous Once [employer]. Pickering, MD   1 mg at 03/06/11 1713  . methylPREDNISolone sodium succinate (SOLU-MEDROL) 125 MG injection 125 mg  125 mg Intravenous Once [employer]. Pickering, MD   125 mg at 03/06/11 1654  . methylPREDNISolone sodium succinate (SOLU-MEDROL) 125 MG injection  125 mg  125 mg Intravenous Q6H Ayonna Speranza C Berlinda Farve, MD      . ondansetron (ZOFRAN) tablet 4 mg  4 mg Oral Q6H PRN Olando Willems Normajean Glasgow, MD       Or  . ondansetron (ZOFRAN) injection 4 mg  4 mg Intravenous Q6H PRN Jayna Mulnix C Seirra Kos, MD      . simvastatin (ZOCOR) tablet 40 mg  40 mg Oral Daily Malanie Koloski C Janeliz Prestwood, MD      . sodium chloride 0.9 % injection 3 mL  3 mL Intravenous Q12H Zelena Bushong C Unique Searfoss, MD      . DISCONTD: albuterol (PROVENTIL) (5 MG/ML) 0.5% nebulizer solution 2.5 mg  2.5 mg Nebulization Q6H Haydee Monica, MD   2.5 mg at 03/05/11 0738  . DISCONTD: albuterol (PROVENTIL) (5 MG/ML) 0.5% nebulizer solution 2.5 mg  2.5 mg Nebulization Q2H PRN Haydee Monica, MD      . DISCONTD: albuterol (PROVENTIL) (5 MG/ML) 0.5% nebulizer solution 2.5 mg  2.5 mg Nebulization Q4H Elliot Cousin, MD   2.5 mg at 03/06/11 1029  . DISCONTD:  albuterol (PROVENTIL,VENTOLIN) solution continuous neb  10 mg/hr Nebulization Continuous Juliet Rude. Pickering, MD      . DISCONTD: enalapril (VASOTEC) tablet 2.5 mg  2.5 mg Oral BID Haydee Monica, MD   2.5 mg at 03/06/11 0454  . DISCONTD: enoxaparin (LOVENOX) injection 40 mg  40 mg Subcutaneous Q24H Haydee Monica, MD   40 mg at 03/05/11 0116  . DISCONTD: famotidine (PEPCID) tablet 20 mg  20 mg Oral Daily Elliot Cousin, MD   20 mg at 03/06/11 0981  . DISCONTD: Fluticasone-Salmeterol (ADVAIR) 500-50 MCG/DOSE inhaler 1 puff  1 puff Inhalation BID Haydee Monica, MD   1 puff at 03/06/11 0729  . DISCONTD: Fluticasone-Salmeterol (ADVAIR) 500-50 MCG/DOSE inhaler 1 puff  1 puff Inhalation BID Edu On C Rashema Seawright, MD      . DISCONTD: guaiFENesin (MUCINEX) 12 hr tablet 600 mg  600 mg Oral BID Haydee Monica, MD   600 mg at 03/05/11 2250  . DISCONTD: insulin aspart (novoLOG) injection 0-20 Units  0-20 Units Subcutaneous TID WC Elliot Cousin, MD   4 Units at 03/05/11 1754  . DISCONTD: insulin aspart (novoLOG) injection 0-5 Units  0-5 Units Subcutaneous QHS Elliot Cousin, MD   0 Units at 03/05/11 2247  . DISCONTD: insulin glargine (LANTUS) injection 10 Units  10 Units Subcutaneous QHS Elliot Cousin, MD   10 Units at 03/05/11 2248  . DISCONTD: ipratropium (ATROVENT) nebulizer solution 0.5 mg  0.5 mg Nebulization Q6H Haydee Monica, MD   0.5 mg at 03/05/11 0739  . DISCONTD: ipratropium (ATROVENT) nebulizer solution 0.5 mg  0.5 mg Nebulization Q4H Elliot Cousin, MD   0.5 mg at 03/06/11 0729  . DISCONTD: methylPREDNISolone sodium succinate (SOLU-MEDROL) 125 MG injection 80 mg  80 mg Intravenous Q12H Haydee Monica, MD   80 mg at 03/05/11 1231  . DISCONTD: sodium chloride 0.9 % nebulizer solution            Medications Prior to Admission  Medication Sig Dispense Refill  . albuterol (PROVENTIL) (2.5 MG/3ML) 0.083% nebulizer solution USE 1 VIAL BREATHING TREATMENT 3 TIMES DAILY AND AS NEEDED FOR WHEEZING AND SHORTNESS OF  BREATH.  75 mL  5  . albuterol-ipratropium (COMBIVENT) 18-103 MCG/ACT inhaler Inhale 2 puffs into the lungs every 4 (four) hours as needed for wheezing or shortness of breath.      . clotrimazole-betamethasone (LOTRISONE) cream Apply to affected area  2 times daily  45 g  0  . enalapril (VASOTEC) 2.5 MG tablet Take 1 tablet (2.5 mg total) by mouth 2 (two) times daily.  60 tablet  0  . famotidine (PEPCID) 20 MG tablet Take 1 tablet (20 mg total) by mouth daily.  30 tablet  3  . Fluticasone-Salmeterol (ADVAIR DISKUS) 500-50 MCG/DOSE AEPB Inhale 1 puff into the lungs 2 (two) times daily.  60 each  11  . ipratropium (ATROVENT) 0.02 % nebulizer solution Take 2.5 mLs (500 mcg total) by nebulization 3 (three) times daily.  75 mL  5  . predniSONE (DELTASONE) 10 MG tablet TAKE 6 TABLETS DAILY FOR TWO DAYS; THEN 5 TABLETS DAILY FOR TWO DAYS; THEN TAKE 4 TABLETS DAILY FOR TWO DAYS; THEN 3 TABLETS DAILY FOR ONE DAY; THEN 2 TABLETS DAILY FOR 1 DAY; THEN 1 TABLET DAILY THEREAFTER.  60 tablet  2  . simvastatin (ZOCOR) 40 MG tablet Take 40 mg by mouth daily.           ZOX:WRUEA from the symptoms mentioned above,there are no other symptoms referable to all systems reviewed.  Physical Exam: Blood pressure 170/89, pulse 156, temperature 98.5 F (36.9 C), temperature source Oral, resp. rate 24, height 5\' 7"  (1.702 m), weight 68.04 kg (150 lb), SpO2 99.00%. He is in respiratory distress. He using accessory muscles of respiration. He is very poor entry in both lung fields. I cannot hear any crackles, but I can hear tight wheezing. He is alert but unable to talk. Heart sounds are present and in sinus rhythm, tachycardic at a rate of 150-160. I can see P waves so do not believe this is SVT. Abdomen is soft and nontender.    Basename 03/06/11 1706 03/06/11 1700 03/06/11 0414 03/04/11 1859  WBC -- 23.7* 12.0* --  NEUTROABS -- 16.8* -- 7.1  HGB 13.3 12.3* -- --  HCT 39.0 37.7* -- --  MCV -- 94.3 92.0 --  PLT -- 223  170 --    Basename 03/06/11 1706 03/06/11 0414 03/05/11 0438  NA 140 137 --  K 4.2 4.2 --  CL 106 101 --  CO2 -- 30 29  GLUCOSE 159* 133* --  BUN 13 14 --  CREATININE 0.90 0.68 --  CALCIUM -- 9.2 9.3  MG -- -- --      Recent Results (from the past 240 hour(s))  MRSA PCR SCREENING     Status: Normal   Collection Time   03/04/11 11:42 PM      Component Value Range Status Comment   MRSA by PCR NEGATIVE  NEGATIVE  Final      Dg Chest Portable 1 View  03/06/2011  *RADIOLOGY REPORT*  Clinical Data: Respiratory distress  PORTABLE CHEST - 1 VIEW  Comparison: Portable exam 1705 hours compared to 03/04/2011  Findings: Upper-normal size of cardiac silhouette. Mediastinal contours and pulmonary vascularity normal. Emphysematous and minimal bronchitic changes. No pulmonary infiltrate, pleural effusion or pneumothorax. Bones appear demineralized.  IMPRESSION: Emphysematous and minimal bronchitic changes. No acute infiltrate.  Original Report Authenticated By: Lollie Marrow, M.D.   Dg Chest Portable 1 View  03/04/2011  *RADIOLOGY REPORT*  Clinical Data: Shortness of breath  PORTABLE CHEST - 1 VIEW  Comparison: 02/17/2011  Findings: The patient has been extubated.  There are attenuated peripheral bronchovascular markings and the lungs appear mildly hyperinflated.  Heart size is normal.  No focal infiltrate.  No effusion.  Regional bones unremarkable.  IMPRESSION:  1.  Mild hyperinflation without  acute or superimposed abnormality.  Original Report Authenticated By: Osa Craver, M.D.   Dg Chest Portable 1 View  02/17/2011  *RADIOLOGY REPORT*  Clinical Data: Respiratory distress.  Shortness of breath.  The endotracheal tube placement.  PORTABLE CHEST - 1 VIEW  Comparison: Chest x-ray 02/17/2011 at to 09:44 p.m.  Findings: The patient has been intubated.  Endotracheal tube terminates 4.4 cm above the carina.  The heart size is normal.  The lungs are clear.  The nodular density from the previous  film is not evident.  IMPRESSION: Satisfactory positioning of the endotracheal tube.  Original Report Authenticated By: Jamesetta Orleans. MATTERN, M.D.   Dg Chest Port 1 View  02/17/2011  *RADIOLOGY REPORT*  Clinical Data: Shortness of breath.  Respiratory distress.  PORTABLE CHEST - 1 VIEW  Comparison: 01/16/2011  Findings: Normal heart size and pulmonary vascularity.  Diffuse emphysematous changes in the lungs.  No focal airspace consolidation.  No blunting of costophrenic angles.  No pneumothorax.  Vague nodular opacity projected over the left midlung measuring 7 mm is not present on the prior study may represent a skin lesion.  IMPRESSION: Diffuse pulmonary emphysema.  No focal consolidation.  Original Report Authenticated By: Marlon Pel, M.D.   Impression: 1. COPD exacerbation leading to respiratory failure. 2. History of tobacco abuse.     Plan: 1. Admit to intensive care unit. 2. Trial of BiPAP. 3. This patient may need intubation and mechanical ventilation once again. 4. CT angiogram of chest to see if there is any evidence of pulmonary embolism. Further recommendations will depend on patient's hospital progress. It is clearly disconcerting that this patient keeps being readmitted, on this occasion within a matter of a few hours. He was clearly very stable on discharge today.      Wilson Singer Pager [phone]  03/06/2011, 5:58 PM

## 2011-03-06 NOTE — ED Notes (Signed)
edp notified of critical abg values.

## 2011-03-06 NOTE — Progress Notes (Signed)
Pt to discharged home. All discharge instruction gone over with patient and explained. All questions and concerns answered. Pt discharged home via wheelchair.

## 2011-03-06 NOTE — Discharge Summary (Signed)
Physician Discharge Summary  ARDA KEADLE [name]: [id number] DOB/AGE: [birth year] 61 y.o.  PCP: Syliva Overman, MD, MD   Admit date: 03/04/2011 Discharge date: 03/06/2011  Discharge Diagnoses:  1. Acute COPD exacerbation with acute respiratory distress/respiratory failure. Treated successfully with BiPAP. The patient was discharged on a prednisone taper and then daily 10 mg dosing of prednisone do to frequent hospitalizations. He was also advised  to not be exposed to secondhand smoke. 2. Steroid-induced hyperglycemia. 3. Hypertension. 4. Mild normocytic anemia. Patient's hemoglobin was 11.3 prior to discharge. 5. Steroid-induced leukocytosis. 6. History of tobacco and substance abuse. Now abstinent. Urine drug screen was negative.    Medication List  As of 03/06/2011  7:56 AM   TAKE these medications         albuterol (2.5 MG/3ML) 0.083% nebulizer solution   Commonly known as: PROVENTIL   USE 1 VIAL BREATHING TREATMENT 3 TIMES DAILY AND AS NEEDED FOR WHEEZING AND SHORTNESS OF BREATH.      albuterol-ipratropium 18-103 MCG/ACT inhaler   Commonly known as: COMBIVENT   Inhale 2 puffs into the lungs every 4 (four) hours as needed for wheezing or shortness of breath.      clotrimazole-betamethasone cream   Commonly known as: LOTRISONE   Apply to affected area 2 times daily      enalapril 2.5 MG tablet   Commonly known as: VASOTEC   Take 1 tablet (2.5 mg total) by mouth 2 (two) times daily.      famotidine 20 MG tablet   Commonly known as: PEPCID   Take 1 tablet (20 mg total) by mouth daily.      Fluticasone-Salmeterol 500-50 MCG/DOSE Aepb   Commonly known as: ADVAIR   Inhale 1 puff into the lungs 2 (two) times daily.      ipratropium 0.02 % nebulizer solution   Commonly known as: ATROVENT   Take 2.5 mLs (500 mcg total) by nebulization 3 (three) times daily.      predniSONE 10 MG tablet   Commonly known as: DELTASONE   TAKE 6 TABLETS DAILY FOR TWO DAYS; THEN 5  TABLETS DAILY FOR TWO DAYS; THEN TAKE 4 TABLETS DAILY FOR TWO DAYS; THEN 3 TABLETS DAILY FOR ONE DAY; THEN 2 TABLETS DAILY FOR 1 DAY; THEN 1 TABLET DAILY THEREAFTER.      simvastatin 40 MG tablet   Commonly known as: ZOCOR   Take 40 mg by mouth daily.            Discharge Condition: Improved and stable.  Disposition: 01-Home or Self Care   Consults: None.   Significant Diagnostic Studies: Dg Chest Portable 1 View  03/04/2011  *RADIOLOGY REPORT*  Clinical Data: Shortness of breath  PORTABLE CHEST - 1 VIEW  Comparison: 02/17/2011  Findings: The patient has been extubated.  There are attenuated peripheral bronchovascular markings and the lungs appear mildly hyperinflated.  Heart size is normal.  No focal infiltrate.  No effusion.  Regional bones unremarkable.  IMPRESSION:  1.  Mild hyperinflation without acute or superimposed abnormality.  Original Report Authenticated By: Osa Craver, M.D.   Dg Chest Portable 1 View  02/17/2011  *RADIOLOGY REPORT*  Clinical Data: Respiratory distress.  Shortness of breath.  The endotracheal tube placement.  PORTABLE CHEST - 1 VIEW  Comparison: Chest x-ray 02/17/2011 at to 09:44 p.m.  Findings: The patient has been intubated.  Endotracheal tube terminates 4.4 cm above the carina.  The heart size is normal.  The lungs are clear.  The nodular density from the previous film is not evident.  IMPRESSION: Satisfactory positioning of the endotracheal tube.  Original Report Authenticated By: Jamesetta Orleans. MATTERN, M.D.   Dg Chest Port 1 View  02/17/2011  *RADIOLOGY REPORT*  Clinical Data: Shortness of breath.  Respiratory distress.  PORTABLE CHEST - 1 VIEW  Comparison: 01/16/2011  Findings: Normal heart size and pulmonary vascularity.  Diffuse emphysematous changes in the lungs.  No focal airspace consolidation.  No blunting of costophrenic angles.  No pneumothorax.  Vague nodular opacity projected over the left midlung measuring 7 mm is not present on the  prior study may represent a skin lesion.  IMPRESSION: Diffuse pulmonary emphysema.  No focal consolidation.  Original Report Authenticated By: Marlon Pel, M.D.     Microbiology: Recent Results (from the past 240 hour(s))  MRSA PCR SCREENING     Status: Normal   Collection Time   03/04/11 11:42 PM      Component Value Range Status Comment   MRSA by PCR NEGATIVE  NEGATIVE  Final      Labs: Results for orders placed during the hospital encounter of 03/04/11 (from the past 48 hour(s))  PRO B NATRIURETIC PEPTIDE     Status: Normal   Collection Time   03/04/11  6:59 PM      Component Value Range Comment   Pro B Natriuretic peptide (BNP) 6.9  0 - 125 (pg/mL)   CBC     Status: Abnormal   Collection Time   03/04/11  6:59 PM      Component Value Range Comment   WBC 10.0  4.0 - 10.5 (K/uL)    RBC 4.22  4.22 - 5.81 (MIL/uL)    Hemoglobin 12.9 (*) 13.0 - 17.0 (g/dL)    HCT 16.1  09.6 - 04.5 (%)    MCV 93.8  78.0 - 100.0 (fL)    MCH 30.6  26.0 - 34.0 (pg)    MCHC 32.6  30.0 - 36.0 (g/dL)    RDW 40.9  81.1 - 91.4 (%)    Platelets 232  150 - 400 (K/uL)   DIFFERENTIAL     Status: Normal   Collection Time   03/04/11  6:59 PM      Component Value Range Comment   Neutrophils Relative 70  43 - 77 (%)    Neutro Abs 7.1  1.7 - 7.7 (K/uL)    Lymphocytes Relative 22  12 - 46 (%)    Lymphs Abs 2.2  0.7 - 4.0 (K/uL)    Monocytes Relative 7  3 - 12 (%)    Monocytes Absolute 0.7  0.1 - 1.0 (K/uL)    Eosinophils Relative 1  0 - 5 (%)    Eosinophils Absolute 0.1  0.0 - 0.7 (K/uL)    Basophils Relative 0  0 - 1 (%)    Basophils Absolute 0.0  0.0 - 0.1 (K/uL)   COMPREHENSIVE METABOLIC PANEL     Status: Abnormal   Collection Time   03/04/11  6:59 PM      Component Value Range Comment   Sodium 137  135 - 145 (mEq/L)    Potassium 4.1  3.5 - 5.1 (mEq/L)    Chloride 100  96 - 112 (mEq/L)    CO2 30  19 - 32 (mEq/L)    Glucose, Bld 180 (*) 70 - 99 (mg/dL)    BUN 12  6 - 23 (mg/dL)    Creatinine,  Ser 7.82  0.50 - 1.35 (mg/dL)  Calcium 9.3  8.4 - 10.5 (mg/dL)    Total Protein 6.5  6.0 - 8.3 (g/dL)    Albumin 3.7  3.5 - 5.2 (g/dL)    AST 21  0 - 37 (U/L)    ALT 28  0 - 53 (U/L)    Alkaline Phosphatase 61  39 - 117 (U/L)    Total Bilirubin 0.2 (*) 0.3 - 1.2 (mg/dL)    GFR calc non Af Amer 88 (*) >90 (mL/min)    GFR calc Af Amer >90  >90 (mL/min)   POCT I-STAT TROPONIN I     Status: Normal   Collection Time   03/04/11  7:10 PM      Component Value Range Comment   Troponin i, poc 0.00  0.00 - 0.08 (ng/mL)    Comment 3            BLOOD GAS, ARTERIAL     Status: Abnormal   Collection Time   03/04/11  8:28 PM      Component Value Range Comment   FIO2 40.00      O2 Content 40.0      Delivery systems BILEVEL POSITIVE AIRWAY PRESSURE      Inspiratory PAP 14      Expiratory PAP 5      pH, Arterial 7.357  7.350 - 7.450     pCO2 arterial 48.9 (*) 35.0 - 45.0 (mmHg)    pO2, Arterial 145.0 (*) 80.0 - 100.0 (mmHg)    Bicarbonate 26.7 (*) 20.0 - 24.0 (mEq/L)    TCO2 23.9  0 - 100 (mmol/L)    Acid-Base Excess 1.8  0.0 - 2.0 (mmol/L)    O2 Saturation 98.8      Patient temperature 37.0      Collection site LEFT RADIAL      Drawn by [id number]      Sample type ARTERIAL      Allens test (pass/fail) PASS  PASS    MRSA PCR SCREENING     Status: Normal   Collection Time   03/04/11 11:42 PM      Component Value Range Comment   MRSA by PCR NEGATIVE  NEGATIVE    URINE RAPID DRUG SCREEN (HOSP PERFORMED)     Status: Normal   Collection Time   03/04/11 11:49 PM      Component Value Range Comment   Opiates NONE DETECTED  NONE DETECTED     Cocaine NONE DETECTED  NONE DETECTED     Benzodiazepines NONE DETECTED  NONE DETECTED     Amphetamines NONE DETECTED  NONE DETECTED     Tetrahydrocannabinol NONE DETECTED  NONE DETECTED     Barbiturates NONE DETECTED  NONE DETECTED    BASIC METABOLIC PANEL     Status: Abnormal   Collection Time   03/05/11  4:38 AM      Component Value Range Comment   Sodium  135  135 - 145 (mEq/L)    Potassium 4.5  3.5 - 5.1 (mEq/L)    Chloride 100  96 - 112 (mEq/L)    CO2 29  19 - 32 (mEq/L)    Glucose, Bld 179 (*) 70 - 99 (mg/dL)    BUN 15  6 - 23 (mg/dL)    Creatinine, Ser 1.61  0.50 - 1.35 (mg/dL)    Calcium 9.3  8.4 - 10.5 (mg/dL)    GFR calc non Af Amer >90  >90 (mL/min)    GFR calc Af Amer >90  >90 (mL/min)  CBC     Status: Abnormal   Collection Time   03/05/11  4:38 AM      Component Value Range Comment   WBC 9.4  4.0 - 10.5 (K/uL)    RBC 3.91 (*) 4.22 - 5.81 (MIL/uL)    Hemoglobin 11.9 (*) 13.0 - 17.0 (g/dL)    HCT 16.1 (*) 09.6 - 52.0 (%)    MCV 93.1  78.0 - 100.0 (fL)    MCH 30.4  26.0 - 34.0 (pg)    MCHC 32.7  30.0 - 36.0 (g/dL)    RDW 04.5  40.9 - 81.1 (%)    Platelets 180  150 - 400 (K/uL)   GLUCOSE, CAPILLARY     Status: Abnormal   Collection Time   03/05/11 12:03 PM      Component Value Range Comment   Glucose-Capillary 145 (*) 70 - 99 (mg/dL)    Comment 1 Notify RN     GLUCOSE, CAPILLARY     Status: Abnormal   Collection Time   03/05/11  4:55 PM      Component Value Range Comment   Glucose-Capillary 159 (*) 70 - 99 (mg/dL)   GLUCOSE, CAPILLARY     Status: Abnormal   Collection Time   03/05/11 10:46 PM      Component Value Range Comment   Glucose-Capillary 156 (*) 70 - 99 (mg/dL)   BASIC METABOLIC PANEL     Status: Abnormal   Collection Time   03/06/11  4:14 AM      Component Value Range Comment   Sodium 137  135 - 145 (mEq/L)    Potassium 4.2  3.5 - 5.1 (mEq/L)    Chloride 101  96 - 112 (mEq/L)    CO2 30  19 - 32 (mEq/L)    Glucose, Bld 133 (*) 70 - 99 (mg/dL)    BUN 14  6 - 23 (mg/dL)    Creatinine, Ser 9.14  0.50 - 1.35 (mg/dL)    Calcium 9.2  8.4 - 10.5 (mg/dL)    GFR calc non Af Amer >90  >90 (mL/min)    GFR calc Af Amer >90  >90 (mL/min)   CBC     Status: Abnormal   Collection Time   03/06/11  4:14 AM      Component Value Range Comment   WBC 12.0 (*) 4.0 - 10.5 (K/uL)    RBC 3.63 (*) 4.22 - 5.81 (MIL/uL)     Hemoglobin 11.3 (*) 13.0 - 17.0 (g/dL)    HCT 78.2 (*) 95.6 - 52.0 (%)    MCV 92.0  78.0 - 100.0 (fL)    MCH 31.1  26.0 - 34.0 (pg)    MCHC 33.8  30.0 - 36.0 (g/dL)    RDW 21.3  08.6 - 57.8 (%)    Platelets 170  150 - 400 (K/uL)      HPI : The patient is a 61 year old man with a past medical history significant for COPD and hypertension, who presented to the emergency department on 03/04/2011 with a chief complaint of shortness of breath. The patient had been hospitalized in the early part of February and and in January 2013 for the same. During the previous hospitalization, he had to be mechanically ventilated. In the emergency department, he was noted to be hypertensive and tachycardic. He was oxygenating 97% on BiPAP and oxygen supplementation. His chest x-ray revealed mild hyperinflation without acute or superimposed abnormality. He was admitted for further evaluation and management.  HOSPITAL COURSE: The  patient was continued on BiPAP and admitted to the ICU. Albuterol and Atrovent nebulizations were ordered every 4 hours and then every 2 hours as needed. IV Solu-Medrol was initiated and titrated accordingly. Advair was continued twice a day. Oxygen was applied and titrated to keep his oxygen saturations greater than or equal to 92%. Prophylactic Pepcid was started daily. Sliding scale NovoLog was added for steroid-induced hyperglycemia. Gentle IV fluids were started for hydration. His ABG the next morning on BiPAP revealed a pH of 7.35, PCO2 of 49, and a PO2 of 145.  Clinically and symptomatically, the patient improved more rapidly than he had done during the previous hospitalizations. I question him about tobacco use and exposure to tobacco smoke. He denies smoking himself. He didn't knowledge that his son smoke in the house. I advised him to discuss with his son not to smoke in the house. I also instructed him to try to stay away from secondhand smoke as much as possible. I asked the  respiratory therapist to review how he should use his nebulizers and Advair inhaler at home. He was discharged on a prednisone taper, but more importantly, I instructed him to taper down to 10 mg of prednisone daily. Therefore, the decision was made to keep him on a low dose of prednisone daily, given his frequent hospitalizations over the past month. He will followup with his pulmonologist Dr. Juanetta Gosling for further evaluation and management.    Discharge Exam: Blood pressure 111/72, pulse 71, temperature 98.5 F (36.9 C), temperature source Oral, resp. rate 17, height 5\' 7"  (1.702 m), weight 57.6 kg (126 lb 15.8 oz), SpO2 98.00%.  Lungs: Clear to auscultation bilaterally. Heart: S1, S2, no murmurs rubs or gallops. Abdomen: Positive bowel sounds, soft, nontender, nondistended. Extremities: No pedal edema.   Discharge Orders    Future Appointments: Provider: Department: Dept Phone: Center:   03/22/2011 9:30 AM Syliva Overman, MD [facility] [phone] [facility]     Future Orders Please Complete By Expires   Diet - low sodium heart healthy      Increase activity slowly      Discharge instructions      Comments:   USE NEBULIZER BREATHING TREATMENTS 3 TIMES DAILY AND THEN AS NEEDED FOR WHEEZING AND SHORTNESS OF BREATH. TAKE PREDNISONE AS PRESCRIBED. NO SMOKING. ASK FAIMLY AND FRIENDS NOT TO SMOKE IN THE HOUSE OR AROUND YOU.      Follow-up Information    Follow up with Syliva Overman, MD. (FOLLOW UP AS SCHEDULED.)    Contact information:   [address], [address] [place] [address] [phone]       Follow up with HAWKINS,EDWARD L, MD in 3 weeks. (FOLLOW UP AS SCHEDULED)    Contact information:   [address] [place] [address] [phone]           Total discharge time: 35 minutes.  Signed: Lunell Robart 03/06/2011, 7:56 AM

## 2011-03-07 DIAGNOSIS — R0609 Other forms of dyspnea: Secondary | ICD-10-CM

## 2011-03-07 DIAGNOSIS — R0989 Other specified symptoms and signs involving the circulatory and respiratory systems: Secondary | ICD-10-CM

## 2011-03-07 LAB — RAPID URINE DRUG SCREEN, HOSP PERFORMED
Barbiturates: NOT DETECTED
Benzodiazepines: NOT DETECTED
Cocaine: NOT DETECTED
Opiates: NOT DETECTED

## 2011-03-07 LAB — CARDIAC PANEL(CRET KIN+CKTOT+MB+TROPI)
CK, MB: 5.3 ng/mL — ABNORMAL HIGH (ref 0.3–4.0)
Total CK: 124 U/L (ref 7–232)
Troponin I: <0.3 ng/mL (ref <0.30)

## 2011-03-07 LAB — CBC
Hemoglobin: 12 g/dL — ABNORMAL LOW (ref 13.0–17.0)
MCHC: 32.9 g/dL (ref 30.0–36.0)
WBC: 11.2 10*3/uL — ABNORMAL HIGH (ref 4.0–10.5)

## 2011-03-07 LAB — COMPREHENSIVE METABOLIC PANEL
ALT: 23 U/L (ref 0–53)
BUN: 13 mg/dL (ref 6–23)
CO2: 29 mEq/L (ref 19–32)
Calcium: 9.4 mg/dL (ref 8.4–10.5)
Creatinine, Ser: 0.66 mg/dL (ref 0.50–1.35)
GFR calc Af Amer: >90 mL/min (ref >90)
GFR calc non Af Amer: >90 mL/min (ref >90)
Glucose, Bld: 164 mg/dL — ABNORMAL HIGH (ref 70–99)
Sodium: 139 mEq/L (ref 135–145)
Total Protein: 6.2 g/dL (ref 6.0–8.3)

## 2011-03-07 MED ORDER — PREDNISONE 20 MG PO TABS
40.0000 mg | ORAL_TABLET | Freq: Every day | ORAL | Status: DC
  Administered 2011-03-07 – 2011-03-08 (×2): 40 mg via ORAL
  Filled 2011-03-07 (×2): qty 2

## 2011-03-07 MED ORDER — ALBUTEROL SULFATE (5 MG/ML) 0.5% IN NEBU
2.5000 mg | INHALATION_SOLUTION | Freq: Three times a day (TID) | RESPIRATORY_TRACT | Status: DC
  Administered 2011-03-07 – 2011-03-08 (×2): 2.5 mg via RESPIRATORY_TRACT
  Filled 2011-03-07 (×2): qty 0.5

## 2011-03-07 NOTE — Progress Notes (Signed)
*  PRELIMINARY RESULTS* Echocardiogram 2D Echocardiogram has been performed.  [name] 03/07/2011, 12:00 PM

## 2011-03-07 NOTE — Progress Notes (Signed)
Subjective: This man was readmitted yesterday in acute respiratory failure. He was put on BiPAP and fortunately he has been able to come off BiPAP and he is on nasal cannula oxygen. He feels back to his normal self! Urine drug screen was negative. CT angiogram of the chest not in acute pulmonary embolism but he does have an acute lower lobe lung infection based on CT scan findings.           Physical Exam: Blood pressure 126/79, pulse 75, temperature 97.9 F (36.6 C), temperature source Axillary, resp. rate 20, height 5\' 7"  (1.702 m), weight 58.3 kg (128 lb 8.5 oz), SpO2 99.00%. He looks systemically well now. Lung fields show poor air entry with just a few scattered wheezes. There are no crackles.   Investigations:  Recent Results (from the past 240 hour(s))  MRSA PCR SCREENING     Status: Normal   Collection Time   03/04/11 11:42 PM      Component Value Range Status Comment   MRSA by PCR NEGATIVE  NEGATIVE  Final   MRSA PCR SCREENING     Status: Normal   Collection Time   03/06/11  8:08 PM      Component Value Range Status Comment   MRSA by PCR NEGATIVE  NEGATIVE  Final      Basic Metabolic Panel:  Basename 03/07/11 0224 03/06/11 1706 03/06/11 0414  NA 139 140 --  K 4.7 4.2 --  CL 102 106 --  CO2 29 -- 30  GLUCOSE 164* 159* --  BUN 13 13 --  CREATININE 0.66 0.90 --  CALCIUM 9.4 -- 9.2  MG -- -- --  PHOS -- -- --   Liver Function Tests:  [facility] 03/07/11 0224 03/04/11 1859  AST 15 21  ALT 23 28  ALKPHOS 59 61  BILITOT 0.3 0.2*  PROT 6.2 6.5  ALBUMIN 3.3* 3.7     CBC:  Basename 03/07/11 0224 03/06/11 1706 03/06/11 1700 03/04/11 1859  WBC 11.2* -- 23.7* --  NEUTROABS -- -- 16.8* 7.1  HGB 12.0* 13.3 -- --  HCT 36.5* 39.0 -- --  MCV 94.3 -- 94.3 --  PLT 146* -- 223 --    Ct Angio Chest W/cm &/or Wo Cm  03/06/2011  *RADIOLOGY REPORT*  Clinical Data: 61 year old male with sudden onset shortness of breath, wheezing.  CT ANGIOGRAPHY CHEST  Technique:   Multidetector CT imaging of the chest using the standard protocol during bolus administration of intravenous contrast. Multiplanar reconstructed images including MIPs were obtained and reviewed to evaluate the vascular anatomy.  Contrast: OMNIPAQUE IOHEXOL 350 MG/ML IV SOLN  Comparison: Chest CT 11/05/2010 and earlier.  Findings: Adequate contrast bolus timing in the pulmonary arterial tree.  Lower lung respiratory motion artifact.  Upper lobe perihilar respiratory motion also noted. No focal filling defect identified in the pulmonary arterial tree to suggest the presence of acute pulmonary embolism.  Layering secretions in the trachea (series 6 image 27). Atelectatic changes to the airway is intermittently noted.  Lower lobe bronchiectasis.  Peripheral nodular airspace opacity in the left lower lobe.  No consolidation.  Right upper lobe central lobular and paraseptal emphysema.  Tiny calcified right upper lobe granuloma is unchanged.  Right lung base bullous emphysema.  Tiny right lower lobe pulmonary nodule (series 6 image 79) is stable since 2011.  No pericardial or pleural effusion.  Stable and negative visualized upper abdominal viscera.  No mediastinal lymphadenopathy.  Negative thoracic inlet.  No acute osseous abnormality  identified.  IMPRESSION: 1. No evidence of acute pulmonary embolus. 2.  Chronic lung disease including bronchiectasis with superimposed acute left lower lobe lung infection.  Retained secretions in the trachea.  Original Report Authenticated By: Harley Hallmark, M.D.   Dg Chest Portable 1 View  03/06/2011  *RADIOLOGY REPORT*  Clinical Data: Respiratory distress  PORTABLE CHEST - 1 VIEW  Comparison: Portable exam 1705 hours compared to 03/04/2011  Findings: Upper-normal size of cardiac silhouette. Mediastinal contours and pulmonary vascularity normal. Emphysematous and minimal bronchitic changes. No pulmonary infiltrate, pleural effusion or pneumothorax. Bones appear demineralized.   IMPRESSION: Emphysematous and minimal bronchitic changes. No acute infiltrate.  Original Report Authenticated By: Lollie Marrow, M.D.      Medications: I have reviewed the patient's current medications.  Impression: 1. Acute exacerbation of COPD with left lower lobe pneumonia. 2. Respiratory failure, now back on nasal cannula oxygen. 3. History of tobacco abuse.     Plan: 1. Discontinue IV steroids and start oral steroids. 2. 2-D echocardiogram to see if there is any cardiac cause for his decompensation. 3. Move to regular medical floor.     LOS: 1 day   Wilson Singer Pager [phone]  03/07/2011, 7:34 AM

## 2011-03-07 NOTE — Progress Notes (Signed)
Subjective: He was admitted last night with severe problems with COPD and respiratory failure. He was treated with BiPAP and was able to avoid being intubated and placed on mechanical ventilation. This morning he is much improved. He is off BiPAP and is on nasal cannula.  Objective: Vital signs in last 24 hours: Temp:  [97.6 F (36.4 C)-98.5 F (36.9 C)] 97.9 F (36.6 C) (02/28 0400) Pulse Rate:  [67-167] 75  (02/28 0600) Resp:  [14-30] 20  (02/28 0348) BP: (111-184)/(60-149) 126/79 mmHg (02/28 0600) SpO2:  [94 %-100 %] 99 % (02/28 0730) FiO2 (%):  [28 %-35 %] 28 % (02/28 0350) Weight:  [58.1 kg (128 lb 1.4 oz)-68.04 kg (150 lb)] 58.3 kg (128 lb 8.5 oz) (02/28 0500) Weight change:  Last BM Date: 03/02/11  Intake/Output from previous day: 02/27 0701 - 02/28 0700 In: 1877.5 [P.O.:240; I.V.:1637.5] Out: 250 [Urine:250]  PHYSICAL EXAM General appearance: alert, cooperative and mild distress Resp: diminished breath sounds bilaterally Cardio: regular rate and rhythm, S1, S2 normal, no murmur, click, rub or gallop GI: soft, non-tender; bowel sounds normal; no masses,  no organomegaly Extremities: extremities normal, atraumatic, no cyanosis or edema  Lab Results:    Basic Metabolic Panel:  Basename 03/07/11 0224 03/06/11 1706 03/06/11 0414  NA 139 140 --  K 4.7 4.2 --  CL 102 106 --  CO2 29 -- 30  GLUCOSE 164* 159* --  BUN 13 13 --  CREATININE 0.66 0.90 --  CALCIUM 9.4 -- 9.2  MG -- -- --  PHOS -- -- --   Liver Function Tests:  Basename 03/07/11 0224 03/04/11 1859  AST 15 21  ALT 23 28  ALKPHOS 59 61  BILITOT 0.3 0.2*  PROT 6.2 6.5  ALBUMIN 3.3* 3.7   No results found for this basename: LIPASE:2,AMYLASE:2 in the last 72 hours No results found for this basename: AMMONIA:2 in the last 72 hours CBC:  Basename 03/07/11 0224 03/06/11 1706 03/06/11 1700 03/04/11 1859  WBC 11.2* -- 23.7* --  NEUTROABS -- -- 16.8* 7.1  HGB 12.0* 13.3 -- --  HCT 36.5* 39.0 -- --  MCV  94.3 -- 94.3 --  PLT 146* -- 223 --   Cardiac Enzymes:  Basename 03/07/11 0221 03/06/11 1706  CKTOTAL 124 129  CKMB 5.3* 4.2*  CKMBINDEX -- --  TROPONINI <0.30 <0.30   BNP:  Basename 03/04/11 1859  PROBNP 6.9   D-Dimer: No results found for this basename: DDIMER:2 in the last 72 hours CBG:  Basename 03/06/11 0737 03/05/11 2246 03/05/11 1655 03/05/11 1203  GLUCAP 106* 156* 159* 145*   Hemoglobin A1C: No results found for this basename: HGBA1C in the last 72 hours Fasting Lipid Panel: No results found for this basename: CHOL,HDL,LDLCALC,TRIG,CHOLHDL,LDLDIRECT in the last 72 hours Thyroid Function Tests: No results found for this basename: TSH,T4TOTAL,FREET4,T3FREE,THYROIDAB in the last 72 hours Anemia Panel: No results found for this basename: VITAMINB12,FOLATE,FERRITIN,TIBC,IRON,RETICCTPCT in the last 72 hours Coagulation: No results found for this basename: LABPROT:2,INR:2 in the last 72 hours Urine Drug Screen: Drugs of Abuse     Component Value Date/Time   LABOPIA NONE DETECTED 03/04/2011 2349   COCAINSCRNUR NONE DETECTED 03/04/2011 2349   LABBENZ NONE DETECTED 03/04/2011 2349   AMPHETMU NONE DETECTED 03/04/2011 2349   THCU NONE DETECTED 03/04/2011 2349   LABBARB NONE DETECTED 03/04/2011 2349    Alcohol Level: No results found for this basename: ETH:2 in the last 72 hours Urinalysis: No results found for this basename: COLORURINE:2,APPERANCEUR:2,LABSPEC:2,PHURINE:2,GLUCOSEU:2,HGBUR:2,BILIRUBINUR:2,KETONESUR:2,PROTEINUR:2,UROBILINOGEN:2,NITRITE:2,LEUKOCYTESUR:2 in the last  72 hours Misc. Labs:  ABGS  Basename 03/06/11 1730  PHART 7.250*  PO2ART 149.0*  TCO2 27.3  HCO3 29.4*   CULTURES Recent Results (from the past 240 hour(s))  MRSA PCR SCREENING     Status: Normal   Collection Time   03/04/11 11:42 PM      Component Value Range Status Comment   MRSA by PCR NEGATIVE  NEGATIVE  Final   MRSA PCR SCREENING     Status: Normal   Collection Time   03/06/11  8:08  PM      Component Value Range Status Comment   MRSA by PCR NEGATIVE  NEGATIVE  Final    Studies/Results: Ct Angio Chest W/cm &/or Wo Cm  03/06/2011  *RADIOLOGY REPORT*  Clinical Data: 61 year old male with sudden onset shortness of breath, wheezing.  CT ANGIOGRAPHY CHEST  Technique:  Multidetector CT imaging of the chest using the standard protocol during bolus administration of intravenous contrast. Multiplanar reconstructed images including MIPs were obtained and reviewed to evaluate the vascular anatomy.  Contrast: OMNIPAQUE IOHEXOL 350 MG/ML IV SOLN  Comparison: Chest CT 11/05/2010 and earlier.  Findings: Adequate contrast bolus timing in the pulmonary arterial tree.  Lower lung respiratory motion artifact.  Upper lobe perihilar respiratory motion also noted. No focal filling defect identified in the pulmonary arterial tree to suggest the presence of acute pulmonary embolism.  Layering secretions in the trachea (series 6 image 27). Atelectatic changes to the airway is intermittently noted.  Lower lobe bronchiectasis.  Peripheral nodular airspace opacity in the left lower lobe.  No consolidation.  Right upper lobe central lobular and paraseptal emphysema.  Tiny calcified right upper lobe granuloma is unchanged.  Right lung base bullous emphysema.  Tiny right lower lobe pulmonary nodule (series 6 image 79) is stable since 2011.  No pericardial or pleural effusion.  Stable and negative visualized upper abdominal viscera.  No mediastinal lymphadenopathy.  Negative thoracic inlet.  No acute osseous abnormality identified.  IMPRESSION: 1. No evidence of acute pulmonary embolus. 2.  Chronic lung disease including bronchiectasis with superimposed acute left lower lobe lung infection.  Retained secretions in the trachea.  Original Report Authenticated By: Harley Hallmark, M.D.   Dg Chest Portable 1 View  03/06/2011  *RADIOLOGY REPORT*  Clinical Data: Respiratory distress  PORTABLE CHEST - 1 VIEW   Comparison: Portable exam 1705 hours compared to 03/04/2011  Findings: Upper-normal size of cardiac silhouette. Mediastinal contours and pulmonary vascularity normal. Emphysematous and minimal bronchitic changes. No pulmonary infiltrate, pleural effusion or pneumothorax. Bones appear demineralized.  IMPRESSION: Emphysematous and minimal bronchitic changes. No acute infiltrate.  Original Report Authenticated By: Lollie Marrow, M.D.    Medications:  Scheduled:   . albuterol  2.5 mg Nebulization Q4H  . azithromycin  500 mg Intravenous Q24H  . cefTRIAXone (ROCEPHIN)  IV  1 g Intravenous Q24H  . clotrimazole   Topical BID  . enalapril  2.5 mg Oral BID  . famotidine  20 mg Oral Daily  . heparin  5,000 Units Subcutaneous Q8H  . ipratropium  500 mcg Nebulization TID  . LORazepam  1 mg Intravenous Once  . LORazepam  1 mg Intravenous Once  . LORazepam  1 mg Intravenous Once  . methylPREDNISolone sodium succinate  125 mg Intravenous Once  . predniSONE  40 mg Oral Q breakfast  . simvastatin  40 mg Oral q1800  . sodium chloride  3 mL Intravenous Q12H  . DISCONTD: Fluticasone-Salmeterol  1 puff Inhalation BID  .  DISCONTD: methylPREDNISolone (SOLU-MEDROL) injection  125 mg Intravenous Q6H   Continuous:   . DISCONTD: sodium chloride 125 mL/hr at 03/07/11 0600  . DISCONTD: albuterol     WRU:[id number], albuterol-ipratropium, iohexol, ondansetron (ZOFRAN) IV, ondansetron  Assesment: He was admitted with acute respiratory failure due to COPD exacerbation. He is much improved. I agree with Dr. Patty Sermons plans to look for other potential causes. Active Problems:  CHRONIC OBSTRUCTIVE PULMONARY DISEASE, ACUTE EXACERBATION  History of tobacco abuse    Plan: Continue his antibiotics steroids inhaled bronchodilators. He probably has pneumonia.    LOS: 1 day   Deaundra Kutzer L 03/07/2011, 8:50 AM

## 2011-03-08 MED ORDER — LEVOFLOXACIN 750 MG PO TABS: 750.0000 mg | ORAL_TABLET | Freq: Every day | ORAL | Status: DC

## 2011-03-08 MED ORDER — PREDNISONE 20 MG PO TABS: ORAL_TABLET | ORAL | Status: DC

## 2011-03-08 NOTE — Discharge Instructions (Signed)
Asthma Attack Prevention HOW CAN ASTHMA BE PREVENTED? Currently, there is no way to prevent asthma from starting. However, you can take steps to control the disease and prevent its symptoms after you have been diagnosed. Learn about your asthma and how to control it. Take an active role to control your asthma by working with your caregiver to create and follow an asthma action plan. An asthma action plan guides you in taking your medicines properly, avoiding factors that make your asthma worse, tracking your level of asthma control, responding to worsening asthma, and seeking emergency care when needed. To track your asthma, keep records of your symptoms, check your peak flow number using a peak flow meter (handheld device that shows how well air moves out of your lungs), and get regular asthma checkups.  Other ways to prevent asthma attacks include:  Use medicines as your caregiver directs.   Identify and avoid things that make your asthma worse (as much as you can).   Keep track of your asthma symptoms and level of control.   Get regular checkups for your asthma.   With your caregiver, write a detailed plan for taking medicines and managing an asthma attack. Then be sure to follow your action plan. Asthma is an ongoing condition that needs regular monitoring and treatment.   Identify and avoid asthma triggers. A number of outdoor allergens and irritants (pollen, mold, cold air, air pollution) can trigger asthma attacks. Find out what causes or makes your asthma worse, and take steps to avoid those triggers (see below).   Monitor your breathing. Learn to recognize warning signs of an attack, such as slight coughing, wheezing or shortness of breath. However, your lung function may already decrease before you notice any signs or symptoms, so regularly measure and record your peak airflow with a home peak flow meter.   Identify and treat attacks early. If you act quickly, you're less likely to have  a severe attack. You will also need less medicine to control your symptoms. When your peak flow measurements decrease and alert you to an upcoming attack, take your medicine as instructed, and immediately stop any activity that may have triggered the attack. If your symptoms do not improve, get medical help.   Pay attention to increasing quick-relief inhaler use. If you find yourself relying on your quick-relief inhaler (such as albuterol), your asthma is not under control. See your caregiver about adjusting your treatment.  IDENTIFY AND CONTROL FACTORS THAT MAKE YOUR ASTHMA WORSE A number of common things can set off or make your asthma symptoms worse (asthma triggers). Keep track of your asthma symptoms for several weeks, detailing all the environmental and emotional factors that are linked with your asthma. When you have an asthma attack, go back to your asthma diary to see which factor, or combination of factors, might have contributed to it. Once you know what these factors are, you can take steps to control many of them.  Allergies: If you have allergies and asthma, it is important to take asthma prevention steps at home. Asthma attacks (worsening of asthma symptoms) can be triggered by allergies, which can cause temporary increased inflammation of your airways. Minimizing contact with the substance to which you are allergic will help prevent an asthma attack. Animal Dander:   Some people are allergic to the flakes of skin or dried saliva from animals with fur or feathers. Keep these pets out of your home.   If you can't keep a pet outdoors, keep the   pet out of your bedroom and other sleeping areas at all times, and keep the door closed.   Remove carpets and furniture covered with cloth from your home. If that is not possible, keep the pet away from fabric-covered furniture and carpets.  Dust Mites:  Many people with asthma are allergic to dust mites. Dust mites are tiny bugs that are found in  every home, in mattresses, pillows, carpets, fabric-covered furniture, bedcovers, clothes, stuffed toys, fabric, and other fabric-covered items.   Cover your mattress in a special dust-proof cover.   Cover your pillow in a special dust-proof cover, or wash the pillow each week in hot water. Water must be hotter than 130 F to kill dust mites. Cold or warm water used with detergent and bleach can also be effective.   Wash the sheets and blankets on your bed each week in hot water.   Try not to sleep or lie on cloth-covered cushions.   Call ahead when traveling and ask for a smoke-free hotel room. Bring your own bedding and pillows, in case the hotel only supplies feather pillows and down comforters, which may contain dust mites and cause asthma symptoms.   Remove carpets from your bedroom and those laid on concrete, if you can.   Keep stuffed toys out of the bed, or wash the toys weekly in hot water or cooler water with detergent and bleach.  Cockroaches:  Many people with asthma are allergic to the droppings and remains of cockroaches.   Keep food and garbage in closed containers. Never leave food out.   Use poison baits, traps, powders, gels, or paste (for example, boric acid).   If a spray is used to kill cockroaches, stay out of the room until the odor goes away.  Indoor Mold:  Fix leaky faucets, pipes, or other sources of water that have mold around them.   Clean moldy surfaces with a cleaner that has bleach in it.  Pollen and Outdoor Mold:  When pollen or mold spore counts are high, try to keep your windows closed.   Stay indoors with windows closed from late morning to afternoon, if you can. Pollen and some mold spore counts are highest at that time.   Ask your caregiver whether you need to take or increase anti-inflammatory medicine before your allergy season starts.  Irritants:   Tobacco smoke is an irritant. If you smoke, ask your caregiver how you can quit. Ask family  members to quit smoking, too. Do not allow smoking in your home or car.   If possible, do not use a wood-burning stove, kerosene heater, or fireplace. Minimize exposure to all sources of smoke, including incense, candles, fires, and fireworks.   Try to stay away from strong odors and sprays, such as perfume, talcum powder, hair spray, and paints.   Decrease humidity in your home and use an indoor air cleaning device. Reduce indoor humidity to below 60 percent. Dehumidifiers or central air conditioners can do this.   Try to have someone else vacuum for you once or twice a week, if you can. Stay out of rooms while they are being vacuumed and for a short while afterward.   If you vacuum, use a dust mask from a hardware store, a double-layered or microfilter vacuum cleaner bag, or a vacuum cleaner with a HEPA filter.   Sulfites in foods and beverages can be irritants. Do not drink beer or wine, or eat dried fruit, processed potatoes, or shrimp if they cause asthma   symptoms.   Cold air can trigger an asthma attack. Cover your nose and mouth with a scarf on cold or windy days.   Several health conditions can make asthma more difficult to manage, including runny nose, sinus infections, reflux disease, psychological stress, and sleep apnea. Your caregiver will treat these conditions, as well.   Avoid close contact with people who have a cold or the flu, since your asthma symptoms may get worse if you catch the infection from them. Wash your hands thoroughly after touching items that may have been handled by people with a respiratory infection.   Get a flu shot every year to protect against the flu virus, which often makes asthma worse for days or weeks. Also get a pneumonia shot once every five to 10 years.  Drugs:  Aspirin and other painkillers can cause asthma attacks. 10% to 20% of people with asthma have sensitivity to aspirin or a group of painkillers called non-steroidal anti-inflammatory drugs  (NSAIDS), such as ibuprofen and naproxen. These drugs are used to treat pain and reduce fevers. Asthma attacks caused by any of these medicines can be severe and even fatal. These drugs must be avoided in people who have known aspirin sensitive asthma. Products with acetaminophen are considered safe for people who have asthma. It is important that people with aspirin sensitivity read labels of all over-the-counter drugs used to treat pain, colds, coughs, and fever.   Beta blockers and ACE inhibitors are other drugs which you should discuss with your caregiver, in relation to your asthma.  ALLERGY SKIN TESTING  Ask your asthma caregiver about allergy skin testing or blood testing (RAST test) to identify the allergens to which you are sensitive. If you are found to have allergies, allergy shots (immunotherapy) for asthma may help prevent future allergies and asthma. With allergy shots, small doses of allergens (substances to which you are allergic) are injected under your skin on a regular schedule. Over a period of time, your body may become used to the allergen and less responsive with asthma symptoms. You can also take measures to minimize your exposure to those allergens. EXERCISE  If you have exercise-induced asthma, or are planning vigorous exercise, or exercise in cold, humid, or dry environments, prevent exercise-induced asthma by following your caregiver's advice regarding asthma treatment before exercising. Document Released: 12/12/2008 Document Revised: 09/05/2010 Document Reviewed: 12/12/2008 [employer] Patient Information 2012 [employer]. 

## 2011-03-08 NOTE — Discharge Summary (Signed)
Physician Discharge Summary  Patient ID: [name] [name]: [id number] DOB/AGE: [birth year] 61 y.o. Primary Care Physician:Margaret Lodema Hong, MD, MD Admit date: 03/06/2011 Discharge date: 03/08/2011    Discharge Diagnoses:  1. Acute respiratory failure secondary to COPD exacerbation. Required BiPAP but did not require intubation and mechanical ventilation. 2. Probable left lower lobe pneumonia. 3. History of tobacco abuse.   Medication List  As of 03/08/2011  8:04 AM   TAKE these medications         albuterol (2.5 MG/3ML) 0.083% nebulizer solution   Commonly known as: PROVENTIL   USE 1 VIAL BREATHING TREATMENT 3 TIMES DAILY AND AS NEEDED FOR WHEEZING AND SHORTNESS OF BREATH.      albuterol-ipratropium 18-103 MCG/ACT inhaler   Commonly known as: COMBIVENT   Inhale 2 puffs into the lungs every 4 (four) hours as needed for wheezing or shortness of breath.      clotrimazole-betamethasone cream   Commonly known as: LOTRISONE   Apply to affected area 2 times daily      enalapril 2.5 MG tablet   Commonly known as: VASOTEC   Take 1 tablet (2.5 mg total) by mouth 2 (two) times daily.      famotidine 20 MG tablet   Commonly known as: PEPCID   Take 1 tablet (20 mg total) by mouth daily.      Fluticasone-Salmeterol 500-50 MCG/DOSE Aepb   Commonly known as: ADVAIR   Inhale 1 puff into the lungs 2 (two) times daily.      ipratropium 0.02 % nebulizer solution   Commonly known as: ATROVENT   Take 2.5 mLs (500 mcg total) by nebulization 3 (three) times daily.      levofloxacin 750 MG tablet   Commonly known as: LEVAQUIN   Take 1 tablet (750 mg total) by mouth daily.      predniSONE 20 MG tablet   Commonly known as: DELTASONE   Take 2 tablets daily for 4 days, then 1 tablet daily for 4 days, then half tablet daily for 4 days, then STOP.      simvastatin 40 MG tablet   Commonly known as: ZOCOR   Take 40 mg by mouth daily.            Discharged Condition: Improved and  stable.    Consults: Pulmonology, Dr. Juanetta Gosling.  Significant Diagnostic Studies: Ct Angio Chest W/cm &/or Wo Cm  03/06/2011  *RADIOLOGY REPORT*  Clinical Data: 61 year old male with sudden onset shortness of breath, wheezing.  CT ANGIOGRAPHY CHEST  Technique:  Multidetector CT imaging of the chest using the standard protocol during bolus administration of intravenous contrast. Multiplanar reconstructed images including MIPs were obtained and reviewed to evaluate the vascular anatomy.  Contrast: OMNIPAQUE IOHEXOL 350 MG/ML IV SOLN  Comparison: Chest CT 11/05/2010 and earlier.  Findings: Adequate contrast bolus timing in the pulmonary arterial tree.  Lower lung respiratory motion artifact.  Upper lobe perihilar respiratory motion also noted. No focal filling defect identified in the pulmonary arterial tree to suggest the presence of acute pulmonary embolism.  Layering secretions in the trachea (series 6 image 27). Atelectatic changes to the airway is intermittently noted.  Lower lobe bronchiectasis.  Peripheral nodular airspace opacity in the left lower lobe.  No consolidation.  Right upper lobe central lobular and paraseptal emphysema.  Tiny calcified right upper lobe granuloma is unchanged.  Right lung base bullous emphysema.  Tiny right lower lobe pulmonary nodule (series 6 image 79) is stable since 2011.  No pericardial  or pleural effusion.  Stable and negative visualized upper abdominal viscera.  No mediastinal lymphadenopathy.  Negative thoracic inlet.  No acute osseous abnormality identified.  IMPRESSION: 1. No evidence of acute pulmonary embolus. 2.  Chronic lung disease including bronchiectasis with superimposed acute left lower lobe lung infection.  Retained secretions in the trachea.  Original Report Authenticated By: Harley Hallmark, M.D.   Dg Chest Portable 1 View  03/06/2011  *RADIOLOGY REPORT*  Clinical Data: Respiratory distress  PORTABLE CHEST - 1 VIEW  Comparison: Portable exam 1705  hours compared to 03/04/2011  Findings: Upper-normal size of cardiac silhouette. Mediastinal contours and pulmonary vascularity normal. Emphysematous and minimal bronchitic changes. No pulmonary infiltrate, pleural effusion or pneumothorax. Bones appear demineralized.  IMPRESSION: Emphysematous and minimal bronchitic changes. No acute infiltrate.  Original Report Authenticated By: Lollie Marrow, M.D.   Dg Chest Portable 1 View  03/04/2011  *RADIOLOGY REPORT*  Clinical Data: Shortness of breath  PORTABLE CHEST - 1 VIEW  Comparison: 02/17/2011  Findings: The patient has been extubated.  There are attenuated peripheral bronchovascular markings and the lungs appear mildly hyperinflated.  Heart size is normal.  No focal infiltrate.  No effusion.  Regional bones unremarkable.  IMPRESSION:  1.  Mild hyperinflation without acute or superimposed abnormality.  Original Report Authenticated By: Osa Craver, M.D.   Dg Chest Portable 1 View  02/17/2011  *RADIOLOGY REPORT*  Clinical Data: Respiratory distress.  Shortness of breath.  The endotracheal tube placement.  PORTABLE CHEST - 1 VIEW  Comparison: Chest x-ray 02/17/2011 at to 09:44 p.m.  Findings: The patient has been intubated.  Endotracheal tube terminates 4.4 cm above the carina.  The heart size is normal.  The lungs are clear.  The nodular density from the previous film is not evident.  IMPRESSION: Satisfactory positioning of the endotracheal tube.  Original Report Authenticated By: Jamesetta Orleans. MATTERN, M.D.   Dg Chest Port 1 View  02/17/2011  *RADIOLOGY REPORT*  Clinical Data: Shortness of breath.  Respiratory distress.  PORTABLE CHEST - 1 VIEW  Comparison: 01/16/2011  Findings: Normal heart size and pulmonary vascularity.  Diffuse emphysematous changes in the lungs.  No focal airspace consolidation.  No blunting of costophrenic angles.  No pneumothorax.  Vague nodular opacity projected over the left midlung measuring 7 mm is not present on the  prior study may represent a skin lesion.  IMPRESSION: Diffuse pulmonary emphysema.  No focal consolidation.  Original Report Authenticated By: Marlon Pel, M.D.  Order# [id number]  Study Date:  03/07/11      Patient Information      Name  [name]   Description     [name]  [id number]   61 year old Male              Result Narrative     *[facility]* [address] [place], [place] [address] [phone]  ------------------------------------------------------------ Transthoracic Echocardiography  Patient: Trenell, Concannon MR #: [id number] Study Date: 03/07/2011 Gender: M Age: 67 Height: 170.2cm Weight: 58.1kg BSA: 1.46m^2 Pt. Status: Room: IC08  SONOGRAPHER Karrie Doffing ADMITTING [place], Raegan Winders C ORDERING Karilyn Cota, Verlie Hellenbrand C REFERRING [place], [place] C ATTENDING Benjiman Core PERFORMING Delma Freeze [place] cc:  ------------------------------------------------------------ LV EF: 55% - 60%  ------------------------------------------------------------ Indications: Dyspnea 786.09.  ------------------------------------------------------------ History: PMH: Chronic obstructive pulmonary disease. PMH: former smoker, asthma, hyperlipidemia, TACH Risk factors: Hypertension.  ------------------------------------------------------------ Study Conclusions  - Left ventricle: The cavity size was normal. Wall thickness was normal. Systolic function was  normal. The estimated ejection fraction was in the range of 55% to 60%. Wall motion was normal; there were no regional wall motion abnormalities. - Mitral valve: Calcified annulus. - Atrial septum: No defect or patent foramen ovale was identified. Impressions:  - Compared to the previous study performed 02/05/10, LV mass not apparent on the present study. LVH and septal flattening, also previously described, are also not present. Transthoracic echocardiography. M-mode, complete 2D, spectral Doppler,  and color Doppler. Height: Height: 170.2cm. Height: 67in. Weight: Weight: 58.1kg. Weight: 127.7lb. Body mass index: BMI: 20kg/m^2. Body surface area: BSA: 1.69m^2. Patient status: Inpatient. Location: ICU/CCU      Lab Results: Basic Metabolic Panel:  Basename 03/07/11 0224 03/06/11 1706 03/06/11 0414  NA 139 140 --  K 4.7 4.2 --  CL 102 106 --  CO2 29 -- 30  GLUCOSE 164* 159* --  BUN 13 13 --  CREATININE 0.66 0.90 --  CALCIUM 9.4 -- 9.2  MG -- -- --  PHOS -- -- --   Liver Function Tests:  [facility] 03/07/11 0224  AST 15  ALT 23  ALKPHOS 59  BILITOT 0.3  PROT 6.2  ALBUMIN 3.3*     CBC:  Basename 03/07/11 0224 03/06/11 1706 03/06/11 1700  WBC 11.2* -- 23.7*  NEUTROABS -- -- 16.8*  HGB 12.0* 13.3 --  HCT 36.5* 39.0 --  MCV 94.3 -- 94.3  PLT 146* -- 223    Recent Results (from the past 240 hour(s))  MRSA PCR SCREENING     Status: Normal   Collection Time   03/04/11 11:42 PM      Component Value Range Status Comment   MRSA by PCR NEGATIVE  NEGATIVE  Final   MRSA PCR SCREENING     Status: Normal   Collection Time   03/06/11  8:08 PM      Component Value Range Status Comment   MRSA by PCR NEGATIVE  NEGATIVE  Final      Hospital Course: This 61 year old man was readmitted on the same day he was discharged with acute respiratory failure. Please see initial history and physical examination. He required BiPAP. His chest x-ray was suggestive of a left lower lobe pneumonia and CT scan angiogram which also showed this. There was no evidence of pulmonary embolism. He was treated with intravenous steroids, intravenous antibiotics and initially BiPAP which was able to  weaned off. An echocardiogram was also done which was unremarkable with normal ejection fraction. He was able to transfer to oral steroids and he is now back to his baseline requiring minimal oxygen and breathing very comfortably. He wishes to go home now.  Discharge Exam: Blood pressure 143/88, pulse 72,  temperature 98 F (36.7 C), temperature source Oral, resp. rate 20, height 5\' 7"  (1.702 m), weight 62.2 kg (137 lb 2 oz), SpO2 99.00%. He does look systemically well. Lung fields are entirely clear with no evidence of wheezing. There is no increased work of breathing. There is no use of accessory muscles of aspiration. He is saturating 99% on 2 L oxygen and I suspect he does not require any oxygen and this point. Heart sounds are present and normal without murmurs. He is alert and orientated.  Disposition: Home. He will have a  tapering course of prednisone and Levaquin for 5 days. He'll follow with Dr. Juanetta Gosling next month.  Discharge Orders    Future Appointments: Provider: Department: Dept Phone: Center:   03/22/2011 9:30 AM Syliva Overman, MD [facility] [phone] [facility]  Future Orders Please Complete By Expires   Diet - low sodium heart healthy      Increase activity slowly         Follow-up Information    Follow up with Syliva Overman, MD .         Signed: Wilson Singer Pager [phone]  03/08/2011, 8:04 AM

## 2011-03-08 NOTE — Progress Notes (Signed)
Discharge instructions given. Follow up appointments made and explained to pt. Med rec and prescriptions given to pt. Pt verbalizes understanding of all instructions and eduction. Pt being discharged home with family.

## 2011-03-08 NOTE — Progress Notes (Signed)
Subjective: He says he feels well. He is back to baseline. I discussed his situation with him again and he still has no real idea why he got sick so quickly again. He did not have any exposures that he is aware of.  Objective: Vital signs in last 24 hours: Temp:  [97.4 F (36.3 C)-98 F (36.7 C)] 98 F (36.7 C) (03/01 0400) Pulse Rate:  [68-101] 72  (03/01 0600) Resp:  [14-20] 20  (03/01 0600) BP: (108-145)/(45-90) 143/88 mmHg (03/01 0600) SpO2:  [96 %-100 %] 99 % (03/01 0711) Weight:  [62.2 kg (137 lb 2 oz)] 62.2 kg (137 lb 2 oz) (03/01 0500) Weight change: -5.839 kg (-12 lb 14 oz) Last BM Date: 03/07/11  Intake/Output from previous day: 02/28 0701 - 03/01 0700 In: 903 [P.O.:600; I.V.:3; IV Piggyback:300] Out: 3150 [Urine:3150]  PHYSICAL EXAM General appearance: alert, cooperative and mild distress Resp: diminished breath sounds bilaterally Cardio: regular rate and rhythm, S1, S2 normal, no murmur, click, rub or gallop GI: soft, non-tender; bowel sounds normal; no masses,  no organomegaly Extremities: extremities normal, atraumatic, no cyanosis or edema  Lab Results:    Basic Metabolic Panel:  Basename 03/07/11 0224 03/06/11 1706 03/06/11 0414  NA 139 140 --  K 4.7 4.2 --  CL 102 106 --  CO2 29 -- 30  GLUCOSE 164* 159* --  BUN 13 13 --  CREATININE 0.66 0.90 --  CALCIUM 9.4 -- 9.2  MG -- -- --  PHOS -- -- --   Liver Function Tests:  Basename 03/07/11 0224  AST 15  ALT 23  ALKPHOS 59  BILITOT 0.3  PROT 6.2  ALBUMIN 3.3*   No results found for this basename: LIPASE:2,AMYLASE:2 in the last 72 hours No results found for this basename: AMMONIA:2 in the last 72 hours CBC:  Basename 03/07/11 0224 03/06/11 1706 03/06/11 1700  WBC 11.2* -- 23.7*  NEUTROABS -- -- 16.8*  HGB 12.0* 13.3 --  HCT 36.5* 39.0 --  MCV 94.3 -- 94.3  PLT 146* -- 223   Cardiac Enzymes:  Basename 03/07/11 0956 03/07/11 0221 03/06/11 1706  CKTOTAL 95 124 129  CKMB 4.4* 5.3* 4.2*    CKMBINDEX -- -- --  TROPONINI <0.30 <0.30 <0.30   BNP: No results found for this basename: PROBNP:3 in the last 72 hours D-Dimer: No results found for this basename: DDIMER:2 in the last 72 hours CBG:  Basename 03/06/11 0737 03/05/11 2246 03/05/11 1655 03/05/11 1203  GLUCAP 106* 156* 159* 145*   Hemoglobin A1C: No results found for this basename: HGBA1C in the last 72 hours Fasting Lipid Panel: No results found for this basename: CHOL,HDL,LDLCALC,TRIG,CHOLHDL,LDLDIRECT in the last 72 hours Thyroid Function Tests: No results found for this basename: TSH,T4TOTAL,FREET4,T3FREE,THYROIDAB in the last 72 hours Anemia Panel: No results found for this basename: VITAMINB12,FOLATE,FERRITIN,TIBC,IRON,RETICCTPCT in the last 72 hours Coagulation: No results found for this basename: LABPROT:2,INR:2 in the last 72 hours Urine Drug Screen: Drugs of Abuse     Component Value Date/Time   LABOPIA NONE DETECTED 03/07/2011 1400   COCAINSCRNUR NONE DETECTED 03/07/2011 1400   LABBENZ NONE DETECTED 03/07/2011 1400   AMPHETMU NONE DETECTED 03/07/2011 1400   THCU NONE DETECTED 03/07/2011 1400   LABBARB NONE DETECTED 03/07/2011 1400    Alcohol Level: No results found for this basename: ETH:2 in the last 72 hours Urinalysis: No results found for this basename: COLORURINE:2,APPERANCEUR:2,LABSPEC:2,PHURINE:2,GLUCOSEU:2,HGBUR:2,BILIRUBINUR:2,KETONESUR:2,PROTEINUR:2,UROBILINOGEN:2,NITRITE:2,LEUKOCYTESUR:2 in the last 72 hours Misc. Labs:  ABGS  Basename 03/06/11 1730  PHART 7.250*  PO2ART  149.0*  TCO2 27.3  HCO3 29.4*   CULTURES Recent Results (from the past 240 hour(s))  MRSA PCR SCREENING     Status: Normal   Collection Time   03/04/11 11:42 PM      Component Value Range Status Comment   MRSA by PCR NEGATIVE  NEGATIVE  Final   MRSA PCR SCREENING     Status: Normal   Collection Time   03/06/11  8:08 PM      Component Value Range Status Comment   MRSA by PCR NEGATIVE  NEGATIVE  Final     Studies/Results: Ct Angio Chest W/cm &/or Wo Cm  03/06/2011  *RADIOLOGY REPORT*  Clinical Data: 61 year old male with sudden onset shortness of breath, wheezing.  CT ANGIOGRAPHY CHEST  Technique:  Multidetector CT imaging of the chest using the standard protocol during bolus administration of intravenous contrast. Multiplanar reconstructed images including MIPs were obtained and reviewed to evaluate the vascular anatomy.  Contrast: OMNIPAQUE IOHEXOL 350 MG/ML IV SOLN  Comparison: Chest CT 11/05/2010 and earlier.  Findings: Adequate contrast bolus timing in the pulmonary arterial tree.  Lower lung respiratory motion artifact.  Upper lobe perihilar respiratory motion also noted. No focal filling defect identified in the pulmonary arterial tree to suggest the presence of acute pulmonary embolism.  Layering secretions in the trachea (series 6 image 27). Atelectatic changes to the airway is intermittently noted.  Lower lobe bronchiectasis.  Peripheral nodular airspace opacity in the left lower lobe.  No consolidation.  Right upper lobe central lobular and paraseptal emphysema.  Tiny calcified right upper lobe granuloma is unchanged.  Right lung base bullous emphysema.  Tiny right lower lobe pulmonary nodule (series 6 image 79) is stable since 2011.  No pericardial or pleural effusion.  Stable and negative visualized upper abdominal viscera.  No mediastinal lymphadenopathy.  Negative thoracic inlet.  No acute osseous abnormality identified.  IMPRESSION: 1. No evidence of acute pulmonary embolus. 2.  Chronic lung disease including bronchiectasis with superimposed acute left lower lobe lung infection.  Retained secretions in the trachea.  Original Report Authenticated By: Harley Hallmark, M.D.   Dg Chest Portable 1 View  03/06/2011  *RADIOLOGY REPORT*  Clinical Data: Respiratory distress  PORTABLE CHEST - 1 VIEW  Comparison: Portable exam 1705 hours compared to 03/04/2011  Findings: Upper-normal size of  cardiac silhouette. Mediastinal contours and pulmonary vascularity normal. Emphysematous and minimal bronchitic changes. No pulmonary infiltrate, pleural effusion or pneumothorax. Bones appear demineralized.  IMPRESSION: Emphysematous and minimal bronchitic changes. No acute infiltrate.  Original Report Authenticated By: Lollie Marrow, M.D.    Medications:  Prior to Admission:  Prescriptions prior to admission  Medication Sig Dispense Refill  . albuterol (PROVENTIL) (2.5 MG/3ML) 0.083% nebulizer solution USE 1 VIAL BREATHING TREATMENT 3 TIMES DAILY AND AS NEEDED FOR WHEEZING AND SHORTNESS OF BREATH.  75 mL  5  . albuterol-ipratropium (COMBIVENT) 18-103 MCG/ACT inhaler Inhale 2 puffs into the lungs every 4 (four) hours as needed for wheezing or shortness of breath.      . clotrimazole-betamethasone (LOTRISONE) cream Apply to affected area 2 times daily  45 g  0  . enalapril (VASOTEC) 2.5 MG tablet Take 1 tablet (2.5 mg total) by mouth 2 (two) times daily.  60 tablet  0  . famotidine (PEPCID) 20 MG tablet Take 1 tablet (20 mg total) by mouth daily.  30 tablet  3  . Fluticasone-Salmeterol (ADVAIR DISKUS) 500-50 MCG/DOSE AEPB Inhale 1 puff into the lungs 2 (two) times  daily.  60 each  11  . ipratropium (ATROVENT) 0.02 % nebulizer solution Take 2.5 mLs (500 mcg total) by nebulization 3 (three) times daily.  75 mL  5  . predniSONE (DELTASONE) 10 MG tablet TAKE 6 TABLETS DAILY FOR TWO DAYS; THEN 5 TABLETS DAILY FOR TWO DAYS; THEN TAKE 4 TABLETS DAILY FOR TWO DAYS; THEN 3 TABLETS DAILY FOR ONE DAY; THEN 2 TABLETS DAILY FOR 1 DAY; THEN 1 TABLET DAILY THEREAFTER.  60 tablet  2  . simvastatin (ZOCOR) 40 MG tablet Take 40 mg by mouth daily.       Scheduled:   . albuterol  2.5 mg Nebulization TID  . azithromycin  500 mg Intravenous Q24H  . cefTRIAXone (ROCEPHIN)  IV  1 g Intravenous Q24H  . clotrimazole   Topical BID  . enalapril  2.5 mg Oral BID  . famotidine  20 mg Oral Daily  . heparin  5,000 Units  Subcutaneous Q8H  . ipratropium  500 mcg Nebulization TID  . predniSONE  40 mg Oral Q breakfast  . simvastatin  40 mg Oral q1800  . sodium chloride  3 mL Intravenous Q12H  . DISCONTD: albuterol  2.5 mg Nebulization Q4H   Continuous:  BJY:[id number], albuterol-ipratropium, ondansetron (ZOFRAN) IV, ondansetron  Assesment: He had acute respiratory failure on the basis of severe COPD. He has had multiple admissions for same. We have not found a cause for him to have his acute decompensations Active Problems:  CHRONIC OBSTRUCTIVE PULMONARY DISEASE, ACUTE EXACERBATION  History of tobacco abuse    Plan: Continue current treatments I don't think there's anything to add at this point.    LOS: 2 days   Oziel Beitler L 03/08/2011, 7:53 AM

## 2011-03-13 LAB — BASIC METABOLIC PANEL
CO2: 27 mEq/L (ref 19–32)
Chloride: 99 mEq/L (ref 96–112)
Creat: 0.98 mg/dL (ref 0.50–1.35)

## 2011-03-13 LAB — HEPATIC FUNCTION PANEL
Albumin: 4 g/dL (ref 3.5–5.2)
Alkaline Phosphatase: 48 U/L (ref 39–117)
Total Bilirubin: 1.1 mg/dL (ref 0.3–1.2)

## 2011-03-13 LAB — LIPID PANEL
HDL: 94 mg/dL (ref >39)
LDL Cholesterol: 156 mg/dL — ABNORMAL HIGH (ref 0–99)
VLDL: 15 mg/dL (ref 0–40)

## 2011-03-13 LAB — HEMOGLOBIN A1C: Mean Plasma Glucose: 146 mg/dL — ABNORMAL HIGH (ref <117)

## 2011-03-14 ENCOUNTER — Ambulatory Visit: Payer: Medicare Other | Admitting: Family Medicine

## 2011-03-15 ENCOUNTER — Ambulatory Visit (INDEPENDENT_AMBULATORY_CARE_PROVIDER_SITE_OTHER): Payer: Medicare Other | Admitting: Family Medicine

## 2011-03-15 ENCOUNTER — Encounter: Payer: Self-pay | Admitting: Family Medicine

## 2011-03-15 VITALS — BP 140/76 | HR 102 | Resp 18 | Ht 67.0 in | Wt 135.1 lb

## 2011-03-15 DIAGNOSIS — R252 Cramp and spasm: Secondary | ICD-10-CM

## 2011-03-15 DIAGNOSIS — J4489 Other specified chronic obstructive pulmonary disease: Secondary | ICD-10-CM

## 2011-03-15 DIAGNOSIS — J449 Chronic obstructive pulmonary disease, unspecified: Secondary | ICD-10-CM

## 2011-03-15 NOTE — Patient Instructions (Signed)
For your breathing continue your nebulizer treatments, finish the prednisone  Drink plenty of fluids Eat foods with potassium I think the cramping will get better. F/U on Monday with Dr. Lodema Hong as scheduled

## 2011-03-17 ENCOUNTER — Encounter: Payer: Self-pay | Admitting: Family Medicine

## 2011-03-17 NOTE — Assessment & Plan Note (Signed)
possibly related to steroids, no swelling or other deformity noted, at this time watchful waiting he will stop prednisone this weekend, he will be seen by PCP on Monday.

## 2011-03-17 NOTE — Progress Notes (Signed)
  Subjective:    Patient ID: [name], male    DOB: Sep 30, 1950, 61 y.o.   [name]: [id number]  HPI  Pt presents with hand tremor and cramping bilaterally for the past few days. He is s/p hospitalization for COPD exacerbation required BiPAP , had neg PE work-up and Echo which was normal. He has finished antibiotics but still on prednisone taper which he will finish this weekend. He denies any injury to his hands or swelling but notices cramping at random times and feels they may knot up. He is able to perform his activities and drive his [place]. He has had this before while on prednisone.   Reviewed recent labs  Review of Systems   GEN- denies fever, chills   MSK- denies joint pain, swelling   SKIN- denies rash   Neuro- denies tingling or numbness in fingers     Objective:   Physical Exam GEN- NAD,alert and oriented x 3 RESP- scattered wheeze, no rhonchi, normal WOB, no rales CVS- mild tachycardia, no murmur Hand- no edema, normal ROM, neg finklestein testing, no atrophy noted,  Neuro-sensation grossly in tact, grasp equal bilat       Assessment & Plan:

## 2011-03-17 NOTE — Assessment & Plan Note (Signed)
Recent exacerbation, much improved, continue home COPD regimine

## 2011-03-18 ENCOUNTER — Encounter: Payer: Self-pay | Admitting: Family Medicine

## 2011-03-18 ENCOUNTER — Other Ambulatory Visit: Payer: Self-pay | Admitting: Family Medicine

## 2011-03-18 ENCOUNTER — Ambulatory Visit (INDEPENDENT_AMBULATORY_CARE_PROVIDER_SITE_OTHER): Payer: Medicare Other | Admitting: Family Medicine

## 2011-03-18 VITALS — BP 130/78 | HR 84 | Resp 16 | Ht 67.0 in | Wt 130.0 lb

## 2011-03-18 DIAGNOSIS — I10 Essential (primary) hypertension: Secondary | ICD-10-CM

## 2011-03-18 DIAGNOSIS — R7301 Impaired fasting glucose: Secondary | ICD-10-CM

## 2011-03-18 DIAGNOSIS — Z0000 Encounter for general adult medical examination without abnormal findings: Secondary | ICD-10-CM

## 2011-03-18 DIAGNOSIS — E785 Hyperlipidemia, unspecified: Secondary | ICD-10-CM

## 2011-03-18 DIAGNOSIS — J449 Chronic obstructive pulmonary disease, unspecified: Secondary | ICD-10-CM

## 2011-03-18 MED ORDER — ROSUVASTATIN CALCIUM 20 MG PO TABS: 20.0000 mg | ORAL_TABLET | Freq: Every day | ORAL | Status: DC

## 2011-03-18 MED ORDER — METFORMIN HCL 500 MG PO TABS: 500.0000 mg | ORAL_TABLET | Freq: Every day | ORAL | Status: DC

## 2011-03-18 NOTE — Assessment & Plan Note (Signed)
Uncontrolled, change to crestor

## 2011-03-18 NOTE — Patient Instructions (Signed)
F/u in 3.5 month.  Stop fried and fatty foods and red meats. New medication for cholesterol is crestor in place of simvastatin.  New medication for blood sugars is metformin, one daily, please cut down on sweets and carbohydrates.   Hba1C fasting lipid and cmp in 3 months before visit,

## 2011-03-22 ENCOUNTER — Encounter: Payer: Medicare Other | Admitting: Family Medicine

## 2011-03-31 NOTE — Assessment & Plan Note (Signed)
Unstable required hospitalization for acute respiratory failure 2/27 to 03/08/2011

## 2011-03-31 NOTE — Progress Notes (Signed)
  Subjective:    Patient ID: [name], male    DOB: [birth year], 61 y.o.   [name]: [id number]  HPI The PT is here for annual exam and re-evaluation of chronic medical conditions, medication management and review of any available recent lab and radiology data.  Preventive health is updated, specifically  Cancer screening and Immunization.   Pulmonary status continues to be unstable and uncontrolled, repeatedly hospitalized in acute failure, most recent was approximately 2 weeks ago. Still not keen on changing living situation, and [facility] social workers have been trying with little success to connect with him The PT denies any adverse reactions to current medications since the last visit.  There are no new concerns.  There are no specific complaints       Review of Systems See HPI Denies recent fever or chills. Denies sinus pressure, nasal congestion, ear pain or sore throat. Chronic shortness of breath with minimal activity and wheezing Denies chest pains, palpitations and leg swelling Denies abdominal pain, nausea, vomiting,diarrhea or constipation.   Denies dysuria, frequency, hesitancy or incontinence. Denies joint pain, swelling and limitation in mobility. Denies headaches, seizures, numbness, or tingling. Denies depression, anxiety or insomnia. Denies skin break down or rash.        Objective:   Physical Exam  Pleasant well nourished male, alert and oriented x 3, in no cardio-pulmonary distress. Afebrile. HEENT No facial trauma or asymetry. Sinuses non tender. EOMI, PERTL, fundoscopic exam is negative for hemorhages or exudates. External ears normal, tympanic membranes clear. Oropharynx moist, no exudate, poor  dentition. Neck: supple, no adenopathy,JVD or thyromegaly.No bruits.  Chest: Decreased air entry throughout, no wheezes.  Non tender to palpation  Breast: No asymetry,no masses. No nipple discharge or inversion. No axillary or supraclavicular  adenopathy  Cardiovascular system; Heart sounds normal,  S1 and  S2 ,no S3.  No murmur, or thrill. Apical beat not displaced Peripheral pulses normal.  Abdomen: Soft, non tender, no organomegaly or masses. No bruits. Bowel sounds normal. No guarding, tenderness or rebound.  Rectal:  No mass. guaiac negative stool. Prostate smooth and firm  Musculoskeletal exam: Full ROM of spine, hips , shoulders and knees. No deformity ,swelling or crepitus noted. No muscle wasting or atrophy.   Neurologic: Cranial nerves 2 to 12 intact. Power, tone ,sensation and reflexes normal throughout. No disturbance in gait. No tremor.  Skin: Intact, no ulceration, erythema , scaling or rash noted. Pigmentation normal throughout  Psych; Normal mood and affect. Judgement and concentration normal        Assessment & Plan:

## 2011-03-31 NOTE — Assessment & Plan Note (Signed)
Pt to start metformin due to elevated HBA1C, likely steroid induced

## 2011-03-31 NOTE — Assessment & Plan Note (Signed)
Controlled, no change in medication  

## 2011-04-15 ENCOUNTER — Other Ambulatory Visit: Payer: Self-pay | Admitting: Family Medicine

## 2011-04-15 MED ORDER — PRAVASTATIN SODIUM 40 MG PO TABS: 40.0000 mg | ORAL_TABLET | Freq: Every evening | ORAL | Status: DC

## 2011-04-16 ENCOUNTER — Telehealth: Payer: Self-pay | Admitting: Family Medicine

## 2011-04-16 MED ORDER — PRAVASTATIN SODIUM 40 MG PO TABS: 40.0000 mg | ORAL_TABLET | Freq: Every evening | ORAL | Status: DC

## 2011-04-16 NOTE — Telephone Encounter (Signed)
Pt aware it was changed to pravastatin

## 2011-04-29 ENCOUNTER — Other Ambulatory Visit: Payer: Self-pay | Admitting: Family Medicine

## 2011-05-13 ENCOUNTER — Other Ambulatory Visit: Payer: Self-pay | Admitting: Family Medicine

## 2011-06-10 ENCOUNTER — Other Ambulatory Visit: Payer: Self-pay | Admitting: Family Medicine

## 2011-06-27 LAB — LIPID PANEL
LDL Cholesterol: 108 mg/dL — ABNORMAL HIGH (ref 0–99)
Total CHOL/HDL Ratio: 2.8 Ratio
Triglycerides: 72 mg/dL (ref <150)
VLDL: 14 mg/dL (ref 0–40)

## 2011-06-27 LAB — COMPLETE METABOLIC PANEL WITH GFR
ALT: 13 U/L (ref 0–53)
AST: 18 U/L (ref 0–37)
Alkaline Phosphatase: 57 U/L (ref 39–117)
Chloride: 103 mEq/L (ref 96–112)
Creat: 1.05 mg/dL (ref 0.50–1.35)
Total Bilirubin: 0.4 mg/dL (ref 0.3–1.2)

## 2011-07-02 ENCOUNTER — Ambulatory Visit (INDEPENDENT_AMBULATORY_CARE_PROVIDER_SITE_OTHER): Payer: Medicare Other | Admitting: Family Medicine

## 2011-07-02 ENCOUNTER — Encounter: Payer: Self-pay | Admitting: Family Medicine

## 2011-07-02 VITALS — BP 130/70 | HR 86 | Resp 18 | Ht 67.0 in | Wt 140.1 lb

## 2011-07-02 DIAGNOSIS — I10 Essential (primary) hypertension: Secondary | ICD-10-CM

## 2011-07-02 DIAGNOSIS — R000 Tachycardia, unspecified: Secondary | ICD-10-CM

## 2011-07-02 DIAGNOSIS — Z1329 Encounter for screening for other suspected endocrine disorder: Secondary | ICD-10-CM

## 2011-07-02 DIAGNOSIS — R7309 Other abnormal glucose: Secondary | ICD-10-CM

## 2011-07-02 DIAGNOSIS — E785 Hyperlipidemia, unspecified: Secondary | ICD-10-CM

## 2011-07-02 NOTE — Patient Instructions (Addendum)
F/u in 4.5 to 5 months, please call if you need me before  Blood pressure and labs  Are doing much better.   HBA1C, and TSH non fasting before next visit  No med changes

## 2011-07-05 ENCOUNTER — Other Ambulatory Visit: Payer: Self-pay | Admitting: Family Medicine

## 2011-07-29 NOTE — Assessment & Plan Note (Signed)
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

## 2011-07-29 NOTE — Assessment & Plan Note (Signed)
Improved, low fat diet discussed and encouraged, no med change

## 2011-07-29 NOTE — Progress Notes (Signed)
  Subjective:    Patient ID: [name], male    DOB: 1950-11-18, 61 y.o.   [name]: [id number]  HPI The PT is here for follow up and re-evaluation of chronic medical conditions, medication management and review of any available recent lab and radiology data.  Preventive health is updated, specifically  Cancer screening and Immunization.   Questions or concerns regarding consultations or procedures which the PT has had in the interim are  addressed. The PT denies any adverse reactions to current medications since the last visit.  There are no new concerns.  There are no specific complaints       Review of Systems See HPI Denies recent fever or chills. Denies sinus pressure, nasal congestion, ear pain or sore throat. Denies chest congestion, productive cough has chronic wheezing. Denies chest pains, palpitations and leg swelling Denies abdominal pain, nausea, vomiting,diarrhea or constipation.   Denies dysuria, frequency, hesitancy or incontinence. Denies joint pain, swelling and limitation in mobility. Denies headaches, seizures, numbness, or tingling. C/o mild depression, not suicidal or homicidal, no interest in medication, denies  anxiety or insomnia. Denies skin break down or rash.        Objective:   Physical Exam  Patient alert and oriented and in no cardiopulmonary distress.  HEENT: No facial asymmetry, EOMI, no sinus tenderness,  oropharynx pink and moist.  Neck supple no adenopathy.  Chest: Clear to auscultation bilaterally.Significantly reduced air entry  CVS: S1, S2 no murmurs, no S3.  ABD: Soft non tender. Bowel sounds normal.  Ext: No edema  MS: Adequate ROM spine, shoulders, hips and knees.  Skin: Intact, no ulcerations or rash noted.  Psych: Good eye contact,blunted  affect. Memory intact not anxious mildly  depressed appearing.  CNS: CN 2-12 intact, power, tone and sensation normal throughout.      Assessment & Plan:

## 2011-07-29 NOTE — Assessment & Plan Note (Signed)
Controlled, no change in medication  

## 2011-09-09 ENCOUNTER — Other Ambulatory Visit: Payer: Self-pay | Admitting: Family Medicine

## 2011-09-14 ENCOUNTER — Other Ambulatory Visit: Payer: Self-pay | Admitting: Family Medicine

## 2011-09-24 ENCOUNTER — Other Ambulatory Visit: Payer: Self-pay | Admitting: Family Medicine

## 2011-10-14 ENCOUNTER — Ambulatory Visit (INDEPENDENT_AMBULATORY_CARE_PROVIDER_SITE_OTHER): Payer: Medicare Other | Admitting: Family Medicine

## 2011-10-14 ENCOUNTER — Encounter: Payer: Self-pay | Admitting: Family Medicine

## 2011-10-14 VITALS — BP 120/80 | HR 98 | Temp 98.8°F | Resp 16 | Ht 67.0 in | Wt 145.0 lb

## 2011-10-14 DIAGNOSIS — J209 Acute bronchitis, unspecified: Secondary | ICD-10-CM

## 2011-10-14 DIAGNOSIS — I10 Essential (primary) hypertension: Secondary | ICD-10-CM

## 2011-10-14 DIAGNOSIS — E785 Hyperlipidemia, unspecified: Secondary | ICD-10-CM

## 2011-10-14 DIAGNOSIS — J45909 Unspecified asthma, uncomplicated: Secondary | ICD-10-CM

## 2011-10-14 MED ORDER — BENZONATATE 100 MG PO CAPS: ORAL_CAPSULE | ORAL | Status: DC

## 2011-10-14 MED ORDER — METHYLPREDNISOLONE ACETATE 40 MG/ML IJ SUSP
40.0000 mg | Freq: Once | INTRAMUSCULAR | Status: AC
  Administered 2011-10-14: 40 mg via INTRAMUSCULAR

## 2011-10-14 MED ORDER — CEFTRIAXONE SODIUM 500 MG IJ SOLR
500.0000 mg | Freq: Once | INTRAMUSCULAR | Status: AC
  Administered 2011-10-14: 500 mg via INTRAMUSCULAR

## 2011-10-14 MED ORDER — PENICILLIN V POTASSIUM 500 MG PO TABS: 500.0000 mg | ORAL_TABLET | Freq: Three times a day (TID) | ORAL | Status: DC

## 2011-10-14 NOTE — Assessment & Plan Note (Signed)
Controlled, no change in medication DASH diet and commitment to daily physical activity for a minimum of 30 minutes discussed and encouraged, as a part of hypertension management. The importance of attaining a healthy weight is also discussed.  

## 2011-10-14 NOTE — Assessment & Plan Note (Signed)
Antibiotic and decongestant prescribed 

## 2011-10-14 NOTE — Progress Notes (Signed)
  Subjective:    Patient ID: [name], male    DOB: 1951/01/02, 61 y.o.   [name]: [id number]  HPI 2 day h/o increased chest congestion and cough productive of yellow sputum , denies sinus pressure sore throat or ear pain, has increased wheeze and shortness of breath, has had chills, but no documented fever Prior to this he has  been doing well, no recent asthma flare   Review of Systems See HPI Denies recent fever or chills. Denies sinus pressure, nasal congestion, ear pain or sore throat.  Denies chest pains, palpitations and leg swelling Denies abdominal pain, nausea, vomiting,diarrhea or constipation.   Denies dysuria, frequency, hesitancy or incontinence. Denies joint pain, swelling and limitation in mobility. Denies headaches, seizures, numbness, or tingling. Denies depression, anxiety or insomnia. Denies skin break down or rash.        Objective:   Physical Exam  Patient alert and oriented and in no cardiopulmonary distress.  HEENT: No facial asymmetry, EOMI, no sinus tenderness,  oropharynx pink and moist.  Neck supple no adenopathy. Chest: decreasd air entry, bilateral crackles and few wheezes  CVS: S1, S2 no murmurs, no S3.  ABD: Soft non tender. Bowel sounds normal.  Ext: No edema  MS: Adequate ROM spine, shoulders, hips and knees.  Skin: Intact, no ulcerations or rash noted.  Psych: Good eye contact, normal affect. Memory intact not anxious or depressed appearing.  CNS: CN 2-12 intact, power, tone and sensation normal throughout.       Assessment & Plan:

## 2011-10-14 NOTE — Patient Instructions (Addendum)
F/u as before.  Schedule appt for flu vaccine next Wednesday  You will get rocephin 500mg  and depo medrol 40mg  Im in the office today, medication isalso sent in for acute bronchitis

## 2011-10-14 NOTE — Assessment & Plan Note (Signed)
Acute flare, steroid injection

## 2011-10-14 NOTE — Assessment & Plan Note (Signed)
Slightly elevated LDL, ow fat diet discussed and encouraged. No med change

## 2011-10-23 ENCOUNTER — Ambulatory Visit (INDEPENDENT_AMBULATORY_CARE_PROVIDER_SITE_OTHER): Payer: Medicare Other

## 2011-10-23 DIAGNOSIS — Z23 Encounter for immunization: Secondary | ICD-10-CM

## 2011-11-15 ENCOUNTER — Encounter: Payer: Self-pay | Admitting: Family Medicine

## 2011-11-15 ENCOUNTER — Other Ambulatory Visit: Payer: Self-pay | Admitting: Family Medicine

## 2011-11-15 ENCOUNTER — Ambulatory Visit (INDEPENDENT_AMBULATORY_CARE_PROVIDER_SITE_OTHER): Payer: Medicare Other | Admitting: Family Medicine

## 2011-11-15 VITALS — BP 140/80 | HR 101 | Resp 17 | Ht 67.0 in | Wt 142.2 lb

## 2011-11-15 DIAGNOSIS — E785 Hyperlipidemia, unspecified: Secondary | ICD-10-CM

## 2011-11-15 DIAGNOSIS — J441 Chronic obstructive pulmonary disease with (acute) exacerbation: Secondary | ICD-10-CM

## 2011-11-15 DIAGNOSIS — I10 Essential (primary) hypertension: Secondary | ICD-10-CM

## 2011-11-15 DIAGNOSIS — J209 Acute bronchitis, unspecified: Secondary | ICD-10-CM

## 2011-11-15 DIAGNOSIS — J45909 Unspecified asthma, uncomplicated: Secondary | ICD-10-CM

## 2011-11-15 MED ORDER — PREDNISONE (PAK) 5 MG PO TABS: 5.0000 mg | ORAL_TABLET | ORAL | Status: DC

## 2011-11-15 MED ORDER — SULFAMETHOXAZOLE-TRIMETHOPRIM 800-160 MG PO TABS: 1.0000 | ORAL_TABLET | Freq: Two times a day (BID) | ORAL | Status: AC

## 2011-11-15 MED ORDER — METHYLPREDNISOLONE ACETATE 80 MG/ML IJ SUSP
80.0000 mg | Freq: Once | INTRAMUSCULAR | Status: AC
  Administered 2011-11-15: 80 mg via INTRAMUSCULAR

## 2011-11-15 MED ORDER — ALBUTEROL SULFATE (5 MG/ML) 0.5% IN NEBU
2.5000 mg | INHALATION_SOLUTION | Freq: Once | RESPIRATORY_TRACT | Status: AC
  Administered 2011-11-15: 2.5 mg via RESPIRATORY_TRACT

## 2011-11-15 MED ORDER — IPRATROPIUM BROMIDE 0.02 % IN SOLN
0.5000 mg | Freq: Once | RESPIRATORY_TRACT | Status: AC
  Administered 2011-11-15: 0.5 mg via RESPIRATORY_TRACT

## 2011-11-15 NOTE — Patient Instructions (Addendum)
F/u as before  Neb treatment and depo medrol 80mg  Im in office.  Meds are sent to your pharmacy also

## 2011-11-15 NOTE — Progress Notes (Signed)
  Subjective:    Patient ID: [name], male    DOB: Dec 05, 1950, 61 y.o.   [name]: [id number]  HPI 2 day h/o increased shortness of breath, cough and chest congestion. Denies fever or chills. Denies sinus pressure ,nasal drainage or sore throat C/o yellow sputum production   Review of Systems See HPI  Denies chest pains, palpitations and leg swelling Denies abdominal pain, nausea, vomiting,diarrhea or constipation.   Denies dysuria, frequency, hesitancy or incontinence. Denies joint pain, swelling and limitation in mobility. Denies headaches, seizures, numbness, or tingling. Denies uncontrolled  depression, anxiety or insomnia. Denies skin break down or rash.        Objective:   Physical Exam Patient alert and oriented and in mild cardiopulmonary distress.  HEENT: No facial asymmetry, EOMI, no sinus tenderness,  oropharynx pink and moist.  Neck supple no adenopathy.  Chest: decreased air entry bilateral wheezes, few crackles  CVS: S1, S2 no murmurs, no S3.  ABD: Soft non tender. Bowel sounds normal.  Ext: No edema  MS: Adequate ROM spine, shoulders, hips and knees.  Skin: Intact, no ulcerations or rash noted.  Psych: Good eye contact, normal affect. Memory intact not anxious or depressed appearing.  CNS: CN 2-12 intact, power, tone and sensation normal throughout.        Assessment & Plan:

## 2011-11-16 LAB — LIPID PANEL
HDL: 70 mg/dL (ref >39)
LDL Cholesterol: 86 mg/dL (ref 0–99)
Total CHOL/HDL Ratio: 2.3 Ratio
Triglycerides: 40 mg/dL (ref <150)
VLDL: 8 mg/dL (ref 0–40)

## 2011-11-16 LAB — HEPATIC FUNCTION PANEL
Albumin: 4.2 g/dL (ref 3.5–5.2)
Alkaline Phosphatase: 52 U/L (ref 39–117)
Total Bilirubin: 0.8 mg/dL (ref 0.3–1.2)
Total Protein: 6.4 g/dL (ref 6.0–8.3)

## 2011-11-16 LAB — BASIC METABOLIC PANEL
Potassium: 4.4 mEq/L (ref 3.5–5.3)
Sodium: 141 mEq/L (ref 135–145)

## 2011-11-16 LAB — HEMOGLOBIN A1C
Hgb A1c MFr Bld: 5.9 % — ABNORMAL HIGH (ref <5.7)
Mean Plasma Glucose: 123 mg/dL — ABNORMAL HIGH (ref <117)

## 2011-11-16 NOTE — Assessment & Plan Note (Signed)
C/o yellow sputum, septra prescribed

## 2011-11-16 NOTE — Assessment & Plan Note (Signed)
Acute flare, high dose steroid therapy

## 2011-11-16 NOTE — Assessment & Plan Note (Signed)
Slightly elevated LDL Hyperlipidemia:Low fat diet discussed and encouraged.

## 2011-11-16 NOTE — Assessment & Plan Note (Signed)
Slightly el;evated blood pressure , no med change DASH diet and commitment to daily physical activity for a minimum of 30 minutes discussed and encouraged, as a part of hypertension management. The importance of attaining a healthy weight is also discussed.

## 2011-11-16 NOTE — Assessment & Plan Note (Signed)
Uncontrolled asthma with acute flare, depo medrol, and steroids Neb treatment in office also

## 2011-11-20 ENCOUNTER — Encounter: Payer: Self-pay | Admitting: Family Medicine

## 2011-11-20 ENCOUNTER — Ambulatory Visit (INDEPENDENT_AMBULATORY_CARE_PROVIDER_SITE_OTHER): Payer: Medicare Other | Admitting: Family Medicine

## 2011-11-20 VITALS — BP 124/82 | HR 106 | Resp 16 | Ht 67.0 in | Wt 142.2 lb

## 2011-11-20 DIAGNOSIS — R7309 Other abnormal glucose: Secondary | ICD-10-CM

## 2011-11-20 DIAGNOSIS — Z125 Encounter for screening for malignant neoplasm of prostate: Secondary | ICD-10-CM

## 2011-11-20 DIAGNOSIS — J45909 Unspecified asthma, uncomplicated: Secondary | ICD-10-CM

## 2011-11-20 DIAGNOSIS — R7301 Impaired fasting glucose: Secondary | ICD-10-CM

## 2011-11-20 DIAGNOSIS — E785 Hyperlipidemia, unspecified: Secondary | ICD-10-CM

## 2011-11-20 DIAGNOSIS — J209 Acute bronchitis, unspecified: Secondary | ICD-10-CM

## 2011-11-20 DIAGNOSIS — I10 Essential (primary) hypertension: Secondary | ICD-10-CM

## 2011-11-20 LAB — PSA, MEDICARE: PSA: 6.31 ng/mL — ABNORMAL HIGH (ref <=4.00)

## 2011-11-20 NOTE — Progress Notes (Signed)
  Subjective:    Patient ID: [name], male    DOB: [birth year], 61 y.o.   [name]: [id number]  HPI The PT is here for follow up and re-evaluation of chronic medical conditions, medication management and review of any available recent lab and radiology data.  Preventive health is updated, specifically  Cancer screening and Immunization.   Questions or concerns regarding consultations or procedures which the PT has had in the interim are  addressed. The PT denies any adverse reactions to current medications since the last visit.  There are no new concerns. States he is much improved with the med course There are no specific complaints       Review of Systems See HPI Denies recent fever or chills. Denies sinus pressure, nasal congestion, ear pain or sore throat. Denies chest congestion, productive cough or wheezing. Denies chest pains, palpitations and leg swelling Denies abdominal pain, nausea, vomiting,diarrhea or constipation.   Denies dysuria, frequency, hesitancy or incontinence. Denies joint pain, swelling and limitation in mobility. Denies headaches, seizures, numbness, or tingling. Denies depression, anxiety or insomnia. Denies skin break down or rash.        Objective:   Physical Exam  Patient alert and oriented and in no cardiopulmonary distress.  HEENT: No facial asymmetry, EOMI, no sinus tenderness,  oropharynx pink and moist.  Neck supple no adenopathy.  Chest: Clear to auscultation bilaterally.  CVS: S1, S2 no murmurs, no S3.  ABD: Soft non tender. Bowel sounds normal.  Ext: No edema  MS: Adequate ROM spine, shoulders, hips and knees.  Skin: Intact, no ulcerations or rash noted.  Psych: Good eye contact, normal affect. Memory intact not anxious or depressed appearing.  CNS: CN 2-12 intact, power, tone and sensation normal throughout.       Assessment & Plan:

## 2011-11-20 NOTE — Patient Instructions (Addendum)
F/u in 4 month  HBA1C and chem 7 in 4 month  I am happy that you are doing better, I will see when you next need to see the urologist

## 2011-11-21 ENCOUNTER — Other Ambulatory Visit: Payer: Self-pay | Admitting: Family Medicine

## 2011-11-21 ENCOUNTER — Telehealth: Payer: Self-pay | Admitting: Family Medicine

## 2011-11-21 DIAGNOSIS — R972 Elevated prostate specific antigen [PSA]: Secondary | ICD-10-CM

## 2011-11-21 NOTE — Telephone Encounter (Signed)
Please let pt know that his PSa is high, he needs to see the urologisst. Per referralstaff Carlisle Beers) Dr.Javaids office told her that pt has to make his own apt, and Mamoudou is already aware. Please ensure Doyle hears from you he needs tom go back and also send the PSa result we just got to Dr Rudean Haskell attention, I will write a letter also, pls send

## 2011-11-22 NOTE — Telephone Encounter (Signed)
Noted  

## 2011-11-24 NOTE — Assessment & Plan Note (Signed)
Controlled, no change in medication DASH diet and commitment to daily physical activity for a minimum of 30 minutes discussed and encouraged, as a part of hypertension management. The importance of attaining a healthy weight is also discussed.  

## 2011-11-24 NOTE — Assessment & Plan Note (Signed)
Improved, pt to complete antibiotic course 

## 2011-11-24 NOTE — Assessment & Plan Note (Signed)
Patient educated about the importance of limiting  Carbohydrate intake , the need to commit to daily physical activity for a minimum of 30 minutes , and to commit weight loss. The fact that changes in all these areas will reduce or eliminate all together the development of diabetes is stressed.    

## 2011-11-24 NOTE — Assessment & Plan Note (Signed)
Improved symptoms

## 2011-11-24 NOTE — Assessment & Plan Note (Signed)
Hyperlipidemia:Low fat diet discussed and encouraged.  No med change at this time     

## 2011-11-29 NOTE — Telephone Encounter (Signed)
Patient has appointment on 11/27.  Lab and letter faxed.

## 2011-12-09 ENCOUNTER — Other Ambulatory Visit: Payer: Self-pay | Admitting: Family Medicine

## 2011-12-11 ENCOUNTER — Other Ambulatory Visit: Payer: Self-pay

## 2011-12-11 MED ORDER — ALBUTEROL SULFATE HFA 108 (90 BASE) MCG/ACT IN AERS: 2.0000 | INHALATION_SPRAY | RESPIRATORY_TRACT | Status: DC | PRN

## 2012-01-08 DIAGNOSIS — R972 Elevated prostate specific antigen [PSA]: Secondary | ICD-10-CM

## 2012-01-08 DIAGNOSIS — R7303 Prediabetes: Secondary | ICD-10-CM

## 2012-01-08 HISTORY — DX: Prediabetes: R73.03

## 2012-01-08 HISTORY — DX: Elevated prostate specific antigen (PSA): R97.20

## 2012-02-14 ENCOUNTER — Emergency Department (HOSPITAL_COMMUNITY): Payer: Medicare Other

## 2012-02-14 ENCOUNTER — Encounter (HOSPITAL_COMMUNITY): Payer: Self-pay

## 2012-02-14 ENCOUNTER — Telehealth: Payer: Self-pay

## 2012-02-14 ENCOUNTER — Emergency Department (HOSPITAL_COMMUNITY)
Admission: EM | Admit: 2012-02-14 | Discharge: 2012-02-14 | Disposition: A | Discharge status: Home / Self Care | Payer: Medicare Other | Attending: Emergency Medicine | Admitting: Emergency Medicine

## 2012-02-14 DIAGNOSIS — Z87891 Personal history of nicotine dependence: Secondary | ICD-10-CM | POA: Insufficient documentation

## 2012-02-14 DIAGNOSIS — J441 Chronic obstructive pulmonary disease with (acute) exacerbation: Secondary | ICD-10-CM | POA: Insufficient documentation

## 2012-02-14 DIAGNOSIS — IMO0002 Reserved for concepts with insufficient information to code with codable children: Secondary | ICD-10-CM | POA: Insufficient documentation

## 2012-02-14 DIAGNOSIS — J45901 Unspecified asthma with (acute) exacerbation: Secondary | ICD-10-CM | POA: Insufficient documentation

## 2012-02-14 DIAGNOSIS — Z8659 Personal history of other mental and behavioral disorders: Secondary | ICD-10-CM | POA: Insufficient documentation

## 2012-02-14 DIAGNOSIS — Z8669 Personal history of other diseases of the nervous system and sense organs: Secondary | ICD-10-CM | POA: Insufficient documentation

## 2012-02-14 DIAGNOSIS — F1210 Cannabis abuse, uncomplicated: Secondary | ICD-10-CM | POA: Insufficient documentation

## 2012-02-14 DIAGNOSIS — R05 Cough: Secondary | ICD-10-CM | POA: Insufficient documentation

## 2012-02-14 DIAGNOSIS — E785 Hyperlipidemia, unspecified: Secondary | ICD-10-CM | POA: Insufficient documentation

## 2012-02-14 DIAGNOSIS — R059 Cough, unspecified: Secondary | ICD-10-CM | POA: Insufficient documentation

## 2012-02-14 DIAGNOSIS — Z8673 Personal history of transient ischemic attack (TIA), and cerebral infarction without residual deficits: Secondary | ICD-10-CM | POA: Insufficient documentation

## 2012-02-14 DIAGNOSIS — Z79899 Other long term (current) drug therapy: Secondary | ICD-10-CM | POA: Insufficient documentation

## 2012-02-14 DIAGNOSIS — I10 Essential (primary) hypertension: Secondary | ICD-10-CM | POA: Insufficient documentation

## 2012-02-14 LAB — CBC WITH DIFFERENTIAL/PLATELET
Eosinophils Absolute: 0.2 10*3/uL (ref 0.0–0.7)
Hemoglobin: 14.9 g/dL (ref 13.0–17.0)
Lymphocytes Relative: 53 % — ABNORMAL HIGH (ref 12–46)
Lymphs Abs: 2.8 10*3/uL (ref 0.7–4.0)
Monocytes Relative: 9 % (ref 3–12)
Neutro Abs: 1.8 10*3/uL (ref 1.7–7.7)
Neutrophils Relative %: 34 % — ABNORMAL LOW (ref 43–77)
Platelets: 213 10*3/uL (ref 150–400)
RBC: 4.89 MIL/uL (ref 4.22–5.81)
WBC: 5.2 10*3/uL (ref 4.0–10.5)

## 2012-02-14 LAB — BLOOD GAS, ARTERIAL
Acid-Base Excess: 4.2 mmol/L — ABNORMAL HIGH (ref 0.0–2.0)
O2 Content: 2 L/min
pCO2 arterial: 50.2 mmHg — ABNORMAL HIGH (ref 35.0–45.0)

## 2012-02-14 LAB — PRO B NATRIURETIC PEPTIDE: Pro B Natriuretic peptide (BNP): <5 pg/mL (ref 0–125)

## 2012-02-14 LAB — BASIC METABOLIC PANEL
CO2: 29 mEq/L (ref 19–32)
Chloride: 97 mEq/L (ref 96–112)
GFR calc non Af Amer: 60 mL/min — ABNORMAL LOW (ref >90)
Glucose, Bld: 145 mg/dL — ABNORMAL HIGH (ref 70–99)
Potassium: 3.3 mEq/L — ABNORMAL LOW (ref 3.5–5.1)
Sodium: 139 mEq/L (ref 135–145)

## 2012-02-14 MED ORDER — ALBUTEROL SULFATE (5 MG/ML) 0.5% IN NEBU
2.5000 mg | INHALATION_SOLUTION | Freq: Once | RESPIRATORY_TRACT | Status: AC
  Administered 2012-02-14: 2.5 mg via RESPIRATORY_TRACT
  Filled 2012-02-14: qty 0.5

## 2012-02-14 MED ORDER — METHYLPREDNISOLONE SODIUM SUCC 125 MG IJ SOLR
125.0000 mg | Freq: Once | INTRAMUSCULAR | Status: AC
  Administered 2012-02-14: 125 mg via INTRAVENOUS
  Filled 2012-02-14: qty 2

## 2012-02-14 MED ORDER — POTASSIUM CHLORIDE CRYS ER 20 MEQ PO TBCR
40.0000 meq | EXTENDED_RELEASE_TABLET | Freq: Once | ORAL | Status: AC
  Administered 2012-02-14: 40 meq via ORAL
  Filled 2012-02-14: qty 2

## 2012-02-14 MED ORDER — IPRATROPIUM BROMIDE 0.02 % IN SOLN
0.5000 mg | Freq: Once | RESPIRATORY_TRACT | Status: AC
  Administered 2012-02-14: 0.5 mg via RESPIRATORY_TRACT
  Filled 2012-02-14: qty 2.5

## 2012-02-14 MED ORDER — PREDNISONE 50 MG PO TABS: ORAL_TABLET | ORAL | Status: DC

## 2012-02-14 MED ORDER — IPRATROPIUM BROMIDE 0.02 % IN SOLN
0.5000 mg | Freq: Once | RESPIRATORY_TRACT | Status: AC
  Administered 2012-02-14: 0.5 mg via RESPIRATORY_TRACT
  Filled 2012-02-14 (×3): qty 2.5

## 2012-02-14 NOTE — ED Notes (Signed)
02 2l [place] applied due to low ra sat.

## 2012-02-14 NOTE — ED Provider Notes (Signed)
History   This chart was scribed for Ian Booze, MD by Charolett Bumpers, ED Scribe. The patient was seen in room APA07/APA07. Patient's care was started at 0951.   CSN: [id number]  Arrival date & time 02/14/12  4098   First MD Initiated Contact with Patient 02/14/12 [phone]      Chief Complaint  Patient presents with  . Shortness of Breath     The history is provided by the patient. No language interpreter was used.  [name] is a 62 y.o. male who has a h/o asthma presents to the Emergency Department complaining of 2 days of constant, moderate SOB that worsened this morning. He reports an associated dry cough. He denies any fever, chills, diaphoresis, body aches, chest pain, nausea, vomiting or decreased appetite. He states that he uses an Albuterol inhaler with mild improvement. He states his SOB is aggravated with coughing. He denies any aggravation with walking or laying flat. He states that he has a h/o prior hospitalizations related to his breathing in which he had to be intubated.   PCP: Dr. Lodema Hong   Past Medical History  Diagnosis Date  . Nicotine addiction   . COPD (chronic obstructive pulmonary disease)   . Depression   . Hyperlipidemia   . Hypertension   . CVA (cerebral vascular accident) 2008    with temporary vision loss   . Asthma   . Marijuana abuse   . History of tobacco abuse 01/14/2011  . History of substance abuse 01/14/2011    Marijuana    History reviewed. No pertinent past surgical history.  Family History  Problem Relation Age of Onset  . Lung disease Sister     History  Substance Use Topics  . Smoking status: Former Smoker -- 0.3 packs/day    Types: Cigarettes    Quit date: 10/14/2010  . Smokeless tobacco: Not on file  . Alcohol Use: Yes     Comment: occasionally      Review of Systems  Constitutional: Negative for fever, chills, diaphoresis and appetite change.  Respiratory: Positive for cough and shortness of breath.    Cardiovascular: Negative for chest pain.  Gastrointestinal: Negative for nausea and vomiting.  Musculoskeletal: Negative for myalgias and back pain.  All other systems reviewed and are negative.    Allergies  Review of patient's allergies indicates no known allergies.  Home Medications   Current Outpatient Rx  Name  Route  Sig  Dispense  Refill  . ADVAIR DISKUS 500-50 MCG/DOSE IN AEPB      INHALE 1 PUFF BY MOUTH TWICE A DAY. RINSE MOUTH AFTER USE.   60 each   10   . ALBUTEROL SULFATE (2.5 MG/3ML) 0.083% IN NEBU      USE 1 VIAL BREATHING TREATMENT 3 TIMES DAILY AND AS NEEDED FOR WHEEZING AND SHORTNESS OF BREATH.   75 mL   5   . ALBUTEROL SULFATE HFA 108 (90 BASE) MCG/ACT IN AERS   Inhalation   Inhale 2 puffs into the lungs every 4 (four) hours as needed for wheezing.   18 each   3   . IPRATROPIUM-ALBUTEROL 18-103 MCG/ACT IN AERO   Inhalation   Inhale 2 puffs into the lungs every 4 (four) hours as needed for wheezing or shortness of breath.         . ATROVENT 0.02 % IN SOLN      INHALE 1 AMPULE (2.5 MILLILITERS) BY NEBULIZATION AS DIRECTED EVERY 6-8 HOURS FOR WHEEZING. MIX WITH ALBUTEROL  SOLUTION BEFOREUSE   312.5 mL   5   . ENALAPRIL MALEATE 2.5 MG PO TABS   Oral   Take 1 tablet (2.5 mg total) by mouth 2 (two) times daily.   60 tablet   0   . FAMOTIDINE 20 MG PO TABS   Oral   Take 1 tablet (20 mg total) by mouth daily.   30 tablet   3   . PRAVASTATIN SODIUM 40 MG PO TABS   Oral   Take 1 tablet (40 mg total) by mouth every evening.   30 tablet   11     Discontinue crestor effective 04/14/2011, due to n ...   . PREDNISONE (PAK) 5 MG PO TABS   Oral   Take 1 tablet (5 mg total) by mouth as directed. Use as directed   21 tablet   0   . TRIAMTERENE-HCTZ 37.5-25 MG PO TABS      TAKE ONE TABLET BY MOUTH ONCE DAILY.   30 tablet   5     Triage Vitals: BP 146/84  Pulse 128  Temp 98.1 F (36.7 C) (Oral)  Resp 26  Ht 5\' 7"  (1.702 m)  Wt 130 lb  (58.968 kg)  BMI 20.36 kg/m2  SpO2 90%  Physical Exam  Nursing note and vitals reviewed. Constitutional: He is oriented to person, place, and time. He appears well-developed and well-nourished. No distress.  HENT:  Head: Normocephalic and atraumatic.  Right Ear: External ear normal.  Left Ear: External ear normal.  Nose: Nose normal.  Mouth/Throat: Oropharynx is clear and moist. No oropharyngeal exudate.          Eyes: Conjunctivae normal and EOM are normal. Pupils are equal, round, and reactive to light.  Neck: Normal range of motion. Neck supple. No tracheal deviation present.  Cardiovascular: Regular rhythm and normal heart sounds.  Tachycardia present.  Exam reveals no gallop and no friction rub.   No murmur heard.      Tachycardic.   Pulmonary/Chest: Accessory muscle usage present. No respiratory distress. He has wheezes. He has no rhonchi. He has rales.       Decreased air movement. Faint rales at both bases. Diffuse wheezes. Slight use of accessory muscles.   Abdominal: Soft. He exhibits no distension.  Musculoskeletal: Normal range of motion. He exhibits no edema.  Neurological: He is alert and oriented to person, place, and time.  Skin: Skin is warm and dry.  Psychiatric: He has a normal mood and affect. His behavior is normal.    ED Course  Procedures (including critical care time)  DIAGNOSTIC STUDIES: Oxygen Saturation is 90% on 2 L [place], adequate by my interpretation.    COORDINATION OF CARE:  09:57-Discussed planned course of treatment with the patient including a breathing treatment, steroids and blood work, who is agreeable at this time.   10:00-Medication Orders: Methylprednisolone sodium succinate (Solu-medrol) 125 mg/ 2 mL injection 125 mg-once; Ipratropium (Atrovent) nebulizer solution 0.5 mg-once; Albuterol (Proventil) (5 mg/mL) 0.5% nebulizer solution 2.5 mg-once.   Results for orders placed during the hospital encounter of 02/14/12  CBC WITH DIFFERENTIAL       Component Value Range   WBC 5.2  4.0 - 10.5 K/uL   RBC 4.89  4.22 - 5.81 MIL/uL   Hemoglobin 14.9  13.0 - 17.0 g/dL   HCT 16.1  09.6 - 04.5 %   MCV 91.8  78.0 - 100.0 fL   MCH 30.5  26.0 - 34.0 pg   MCHC 33.2  30.0 - 36.0 g/dL   RDW 19.1  47.8 - 29.5 %   Platelets 213  150 - 400 K/uL   Neutrophils Relative 34 (*) 43 - 77 %   Neutro Abs 1.8  1.7 - 7.7 K/uL   Lymphocytes Relative 53 (*) 12 - 46 %   Lymphs Abs 2.8  0.7 - 4.0 K/uL   Monocytes Relative 9  3 - 12 %   Monocytes Absolute 0.4  0.1 - 1.0 K/uL   Eosinophils Relative 4  0 - 5 %   Eosinophils Absolute 0.2  0.0 - 0.7 K/uL   Basophils Relative 0  0 - 1 %   Basophils Absolute 0.0  0.0 - 0.1 K/uL  BASIC METABOLIC PANEL      Component Value Range   Sodium 139  135 - 145 mEq/L   Potassium 3.3 (*) 3.5 - 5.1 mEq/L   Chloride 97  96 - 112 mEq/L   CO2 29  19 - 32 mEq/L   Glucose, Bld 145 (*) 70 - 99 mg/dL   BUN 16  6 - 23 mg/dL   Creatinine, Ser 6.21  0.50 - 1.35 mg/dL   Calcium 9.9  8.4 - 30.8 mg/dL   GFR calc non Af Amer 60 (*) >90 mL/min   GFR calc Af Amer 69 (*) >90 mL/min  TROPONIN I      Component Value Range   Troponin I <0.30  <0.30 ng/mL  PRO B NATRIURETIC PEPTIDE      Component Value Range   Pro B Natriuretic peptide (BNP) <5.0  0 - 125 pg/mL  BLOOD GAS, ARTERIAL      Component Value Range   O2 Content 2.0     Delivery systems NASAL CANNULA     pH, Arterial 7.380  7.350 - 7.450   pCO2 arterial 50.2 (*) 35.0 - 45.0 mmHg   pO2, Arterial 87.2  80.0 - 100.0 mmHg   Bicarbonate 29.0 (*) 20.0 - 24.0 mEq/L   TCO2 25.3  0 - 100 mmol/L   Acid-Base Excess 4.2 (*) 0.0 - 2.0 mmol/L   O2 Saturation 97.2     Patient temperature 37.0     Collection site RIGHT RADIAL     Drawn by COLLECTED BY RT     Sample type ARTERIAL     Allens test (pass/fail) PASS  PASS    Dg Chest 2 View  02/14/2012  *RADIOLOGY REPORT*  Clinical Data: Shortness of breath.  CHEST - 2 VIEW  Comparison: 03/06/2011  Findings: Stable evidence of  pulmonary hyperinflation and COPD.  No infiltrate, pneumothorax, edema or pleural fluid is identified. Cardiac and mediastinal contours are within normal limits.  Old left eighth rib fracture.  IMPRESSION: Stable COPD.   Original Report Authenticated By: Irish Lack, M.D.      Date: 02/14/2012  Rate: 119  Rhythm: sinus tachycardia  QRS Axis: normal  Intervals: normal  ST/T Wave abnormalities: normal  Conduction Disutrbances:none  Narrative Interpretation: Sinus tachycardia, right atrial hypertrophy, left atrial hypertrophy, old anteroseptal myocardial infarction. When compared with ECG of 12/04/2011, no significant changes are seen.  Old EKG Reviewed: unchanged    1. COPD exacerbation       MDM  Exacerbation of COPD. Although he does not appear to be in severe distress, I am concerned about his history of requiring mechanical ventilation. Old records are reviewed and it was last February that he had been admitted and intubated for his COPD. He is placed in wheezing protocol and  given a dose of methylprednisolone as well as albuterol with Atrovent nebulizers.  He got modest relief with the first nebulizer treatment, moderate relief with the second and by the end of his third treatment he states that his breathing was back at baseline. ABG shows hypercarbia which is well compensated and no evidence of respiratory failure. On exam, there was decreased airflow but no wheezing and no use of accessory muscles. He states that he feels comfortable going home. He is given a prescription for prednisone is to followup with his PCP in 4 days.  I personally performed the services described in this documentation, which was scribed in my presence. The recorded information has been reviewed and is accurate.       Ian Booze, MD 02/14/12 [phone]

## 2012-02-14 NOTE — ED Notes (Signed)
Pt states he has asthma. States his breathing treatment are not working.

## 2012-02-14 NOTE — Telephone Encounter (Signed)
Patient walked in office with SOB and wheezing. O2 was 87. Advised ED. Walked up patient outside and made sure he got to ed safely

## 2012-02-14 NOTE — ED Notes (Signed)
First breathing tx in progress. No change

## 2012-02-14 NOTE — Telephone Encounter (Signed)
Noted, thanks!

## 2012-02-14 NOTE — ED Notes (Signed)
Pt ra sat after ambulating is 90%. edp aware and continue orders to d/c. Pt in no resp distress.

## 2012-02-18 ENCOUNTER — Ambulatory Visit (INDEPENDENT_AMBULATORY_CARE_PROVIDER_SITE_OTHER): Payer: Medicare Other | Admitting: Family Medicine

## 2012-02-18 ENCOUNTER — Encounter: Payer: Self-pay | Admitting: Family Medicine

## 2012-02-18 VITALS — BP 140/80 | HR 81 | Resp 16 | Ht 67.0 in | Wt 146.1 lb

## 2012-02-18 DIAGNOSIS — E785 Hyperlipidemia, unspecified: Secondary | ICD-10-CM

## 2012-02-18 DIAGNOSIS — J45909 Unspecified asthma, uncomplicated: Secondary | ICD-10-CM

## 2012-02-18 DIAGNOSIS — I10 Essential (primary) hypertension: Secondary | ICD-10-CM

## 2012-02-18 DIAGNOSIS — J441 Chronic obstructive pulmonary disease with (acute) exacerbation: Secondary | ICD-10-CM

## 2012-02-18 NOTE — Assessment & Plan Note (Signed)
Hyperlipidemia:Low fat diet discussed and encouraged.  Updated lab next visit 

## 2012-02-18 NOTE — Assessment & Plan Note (Signed)
No acute flare currently, but overall control is poor, Being followed by pulmonary also

## 2012-02-18 NOTE — Patient Instructions (Addendum)
F/u as before.  Your brrathing is improved, please complete medication provided at the ED  Check your pharmacy for the shingles vaccine, you need this

## 2012-02-18 NOTE — Assessment & Plan Note (Signed)
Resolved, pt will complete steroid burst today, no additional medication precribed

## 2012-02-18 NOTE — Assessment & Plan Note (Signed)
Controlled, no change in medication  

## 2012-02-18 NOTE — Progress Notes (Signed)
  Subjective:    Patient ID: [name], male    DOB: 14-Mar-1950, 62 y.o.   [name]: [id number]  HPI  Pt in for Ed follow up from exaccerbation ofasthma and COPD. States he feels much better, denies breathing difficulty, cough fever or chills. Has seen his pulmonary Doctor 5 days ago. Finishing the last prednisone tablet prescribed from the ED today.   Review of Systems See HPI Denies recent fever or chills. Denies sinus pressure, nasal congestion, ear pain or soDenies chest congestion, productive cough or wheezing. Denies chest pains, palpitations and leg swelling Denies abdominal pain, nausea, vomiting,diarrhea or constipation.   Denies dysuria, frequency, hesitancy or incontinence. Denies joint pain, swelling and limitation in mobility. Denies headaches, seizures, numbness, or tingling. Denies depression, anxiety or insomnia. Denies skin break down or rash.        Objective:   Physical Exam  Patient alert and oriented and in no cardiopulmonary distress.  HEENT: No facial asymmetry, EOMI, no sinus tenderness,  oropharynx pink and moist.  Neck supple no adenopathy.  Chest: Diminished air entry throughout, no crackles or wheezes  CVS: S1, S2 no murmurs, no S3.  ABD: Soft non tender. Bowel sounds normal.  Ext: No edema  MS: Adequate ROM spine, shoulders, hips and knees.  Skin: Intact, no ulcerations or rash noted.  Psych: Good eye contact, normal affect. Memory intact not anxious or depressed appearing.  CNS: CN 2-12 intact, power, tone and sensation normal throughout.       Assessment & Plan:

## 2012-03-05 ENCOUNTER — Other Ambulatory Visit: Payer: Self-pay

## 2012-03-05 MED ORDER — ALBUTEROL SULFATE (2.5 MG/3ML) 0.083% IN NEBU: 2.5000 mg | INHALATION_SOLUTION | RESPIRATORY_TRACT | Status: DC | PRN

## 2012-03-17 ENCOUNTER — Telehealth: Payer: Self-pay

## 2012-03-17 DIAGNOSIS — E119 Type 2 diabetes mellitus without complications: Secondary | ICD-10-CM

## 2012-03-17 DIAGNOSIS — R5381 Other malaise: Secondary | ICD-10-CM

## 2012-03-17 DIAGNOSIS — I10 Essential (primary) hypertension: Secondary | ICD-10-CM

## 2012-03-17 LAB — TSH: TSH: 4.28 u[IU]/mL (ref 0.350–4.500)

## 2012-03-19 NOTE — Telephone Encounter (Signed)
Labs ordered.

## 2012-03-25 ENCOUNTER — Encounter: Payer: Self-pay | Admitting: Family Medicine

## 2012-03-25 ENCOUNTER — Ambulatory Visit (INDEPENDENT_AMBULATORY_CARE_PROVIDER_SITE_OTHER): Payer: Medicare Other | Admitting: Family Medicine

## 2012-03-25 VITALS — BP 130/76 | HR 100 | Resp 18 | Ht 67.0 in | Wt 147.1 lb

## 2012-03-25 DIAGNOSIS — R972 Elevated prostate specific antigen [PSA]: Secondary | ICD-10-CM

## 2012-03-25 DIAGNOSIS — Z125 Encounter for screening for malignant neoplasm of prostate: Secondary | ICD-10-CM

## 2012-03-25 DIAGNOSIS — E785 Hyperlipidemia, unspecified: Secondary | ICD-10-CM

## 2012-03-25 DIAGNOSIS — E119 Type 2 diabetes mellitus without complications: Secondary | ICD-10-CM

## 2012-03-25 DIAGNOSIS — R7301 Impaired fasting glucose: Secondary | ICD-10-CM

## 2012-03-25 DIAGNOSIS — J45909 Unspecified asthma, uncomplicated: Secondary | ICD-10-CM

## 2012-03-25 DIAGNOSIS — I10 Essential (primary) hypertension: Secondary | ICD-10-CM

## 2012-03-25 MED ORDER — PRAVASTATIN SODIUM 40 MG PO TABS: 40.0000 mg | ORAL_TABLET | Freq: Every evening | ORAL | Status: DC

## 2012-03-25 NOTE — Patient Instructions (Addendum)
Annual wellness in  4 month, pls call if you need me before.  I am going to try get a Child psychotherapist to asses you for help at home which I believe that you do need   Fasting lipid cmp and hBa1C in  4 month  Remember appt for prostae and blood work  PSA in April for Dr Jerre Simon.  Appt for Dr Shirline Frees is May 1 at 3pm

## 2012-03-25 NOTE — Progress Notes (Signed)
  Subjective:    Patient ID: [name], male    DOB: 1950/04/05, 62 y.o.   [name]: [id number]  HPI The PT is here for follow up and re-evaluation of chronic medical conditions, medication management and review of any available recent lab and radiology data.  Preventive health is updated, specifically  Cancer screening and Immunization.   Questions or concerns regarding consultations or procedures which the PT has had in the interim are  addressed. The PT denies any adverse reactions to current medications since the last visit.  Recently treated in the ED for acute breathing difficulty approx 2 weeks ago. No fever, chills or acute  respiratory distress at this time States he needs help at home for cleaning and cooking  States sOB with minimal activity, uses neb machine 3 times daily, also advair      Review of Systems See HPI Denies recent fever or chills. Denies sinus pressure, nasal congestion, ear pain or sore throat. Chronic cough and wheeze, and dyspnea with minimal activity Denies chest pains, palpitations and leg swelling Denies abdominal pain, nausea, vomiting,diarrhea or constipation.   Denies dysuria, frequency, hesitancy or incontinence. Denies joint pain, swelling and limitation in mobility. Denies headaches, seizures, numbness, or tingling. Denies depression, anxiety or insomnia. Denies skin break down or rash.        Objective:   Physical Exam  Patient alert and oriented and in no cardiopulmonary distress.  HEENT: No facial asymmetry, EOMI, no sinus tenderness,  oropharynx pink and moist.  Neck supple no adenopathy.  Chest: decreased air entry, no crackles or wheezes CVS: S1, S2 no murmurs, no S3.  ABD: Soft non tender. Bowel sounds normal.  Ext: No edema  MS: Adequate ROM spine, shoulders, hips and knees.  Skin: Intact, no ulcerations or rash noted.  Psych: Good eye contact, normal affect. Memory intact not anxious or depressed appearing.  CNS: CN  2-12 intact, power, tone and sensation normal throughout.       Assessment & Plan:

## 2012-03-29 NOTE — Assessment & Plan Note (Signed)
Controlled, no change in medication DASH diet and commitment to daily physical activity for a minimum of 30 minutes discussed and encouraged, as a part of hypertension management. The importance of attaining a healthy weight is also discussed.  

## 2012-03-29 NOTE — Assessment & Plan Note (Signed)
Currently stable.

## 2012-03-29 NOTE — Assessment & Plan Note (Signed)
Controlled with diet Patient educated about the importance of limiting  Carbohydrate intake , the need to commit to daily physical activity for a minimum of 30 minutes , and to commit weight loss. The fact that changes in all these areas will reduce or eliminate all together the development of diabetes is stressed.

## 2012-03-29 NOTE — Assessment & Plan Note (Signed)
Reminded of upcoming appt with urology

## 2012-04-03 ENCOUNTER — Other Ambulatory Visit: Payer: Self-pay | Admitting: Family Medicine

## 2012-04-09 ENCOUNTER — Other Ambulatory Visit: Payer: Self-pay | Admitting: Family Medicine

## 2012-04-26 IMAGING — CR DG CHEST 1V PORT
1 series · 1 of 1 positions shown · non-contrast
Comparison: Portable exam 3979 hours compared to [DATE]/7107 1111
hours

CLINICAL DATA: Respiratory distress, on ventilator

PORTABLE CHEST - 1 VIEW

[view not recorded]
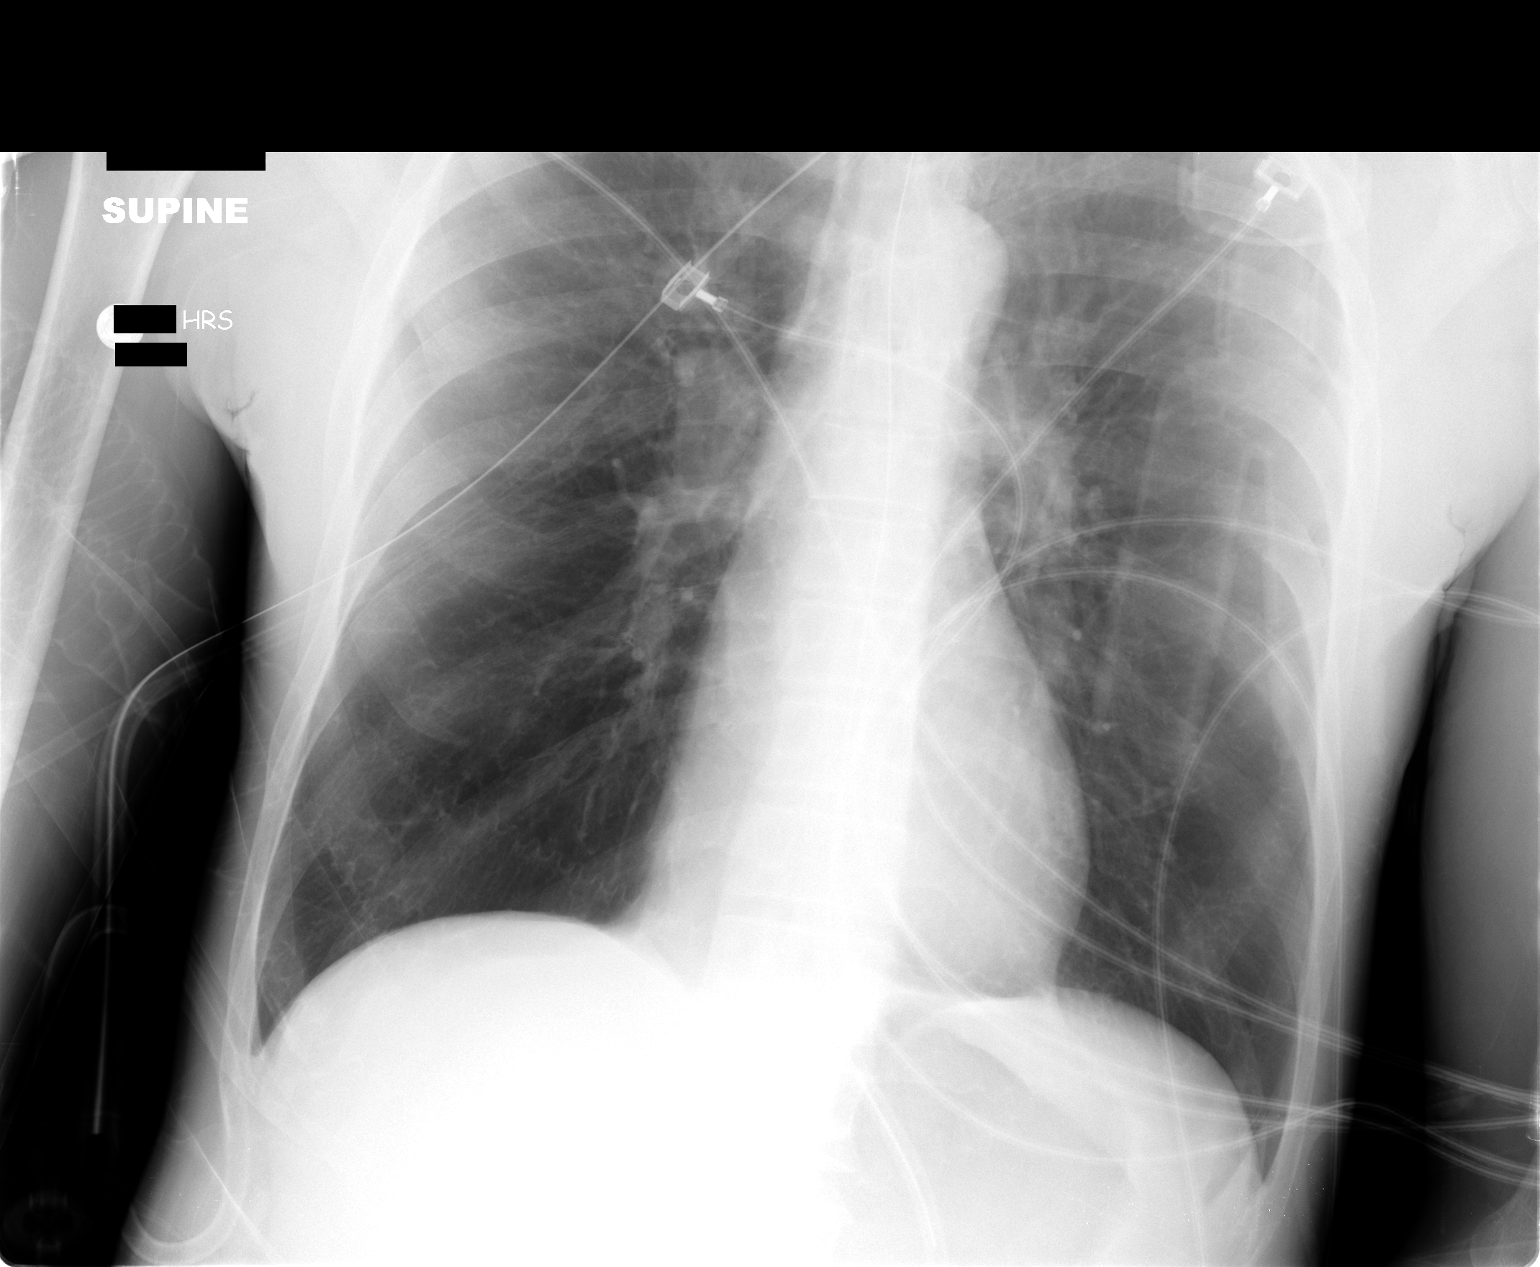

[1 of 1 positions shown; findings below may reference images not displayed]

FINDINGS: Tip of endotracheal tube 6.2 cm above carina.
Normal heart size, mediastinal contours, and pulmonary vascularity.
Tips of lung apices excluded.
Lungs appear hyperexpanded but clear.
No gross pleural effusion or pneumothorax.
Support devices and cardiac monitoring lines project over chest.
IMPRESSION: Satisfactory tracheal tube position.
Emphysematous changes.

## 2012-04-26 IMAGING — CR DG CHEST 1V PORT
1 series · 1 of 1 positions shown · non-contrast
Comparison: Portable chest x-ray yesterday 9885 hours.

CLINICAL DATA: Intubation.  COPD exacerbation shortness of breath.

PORTABLE CHEST - 1 VIEW [DATE]/8218 2226 hours:

[view not recorded]
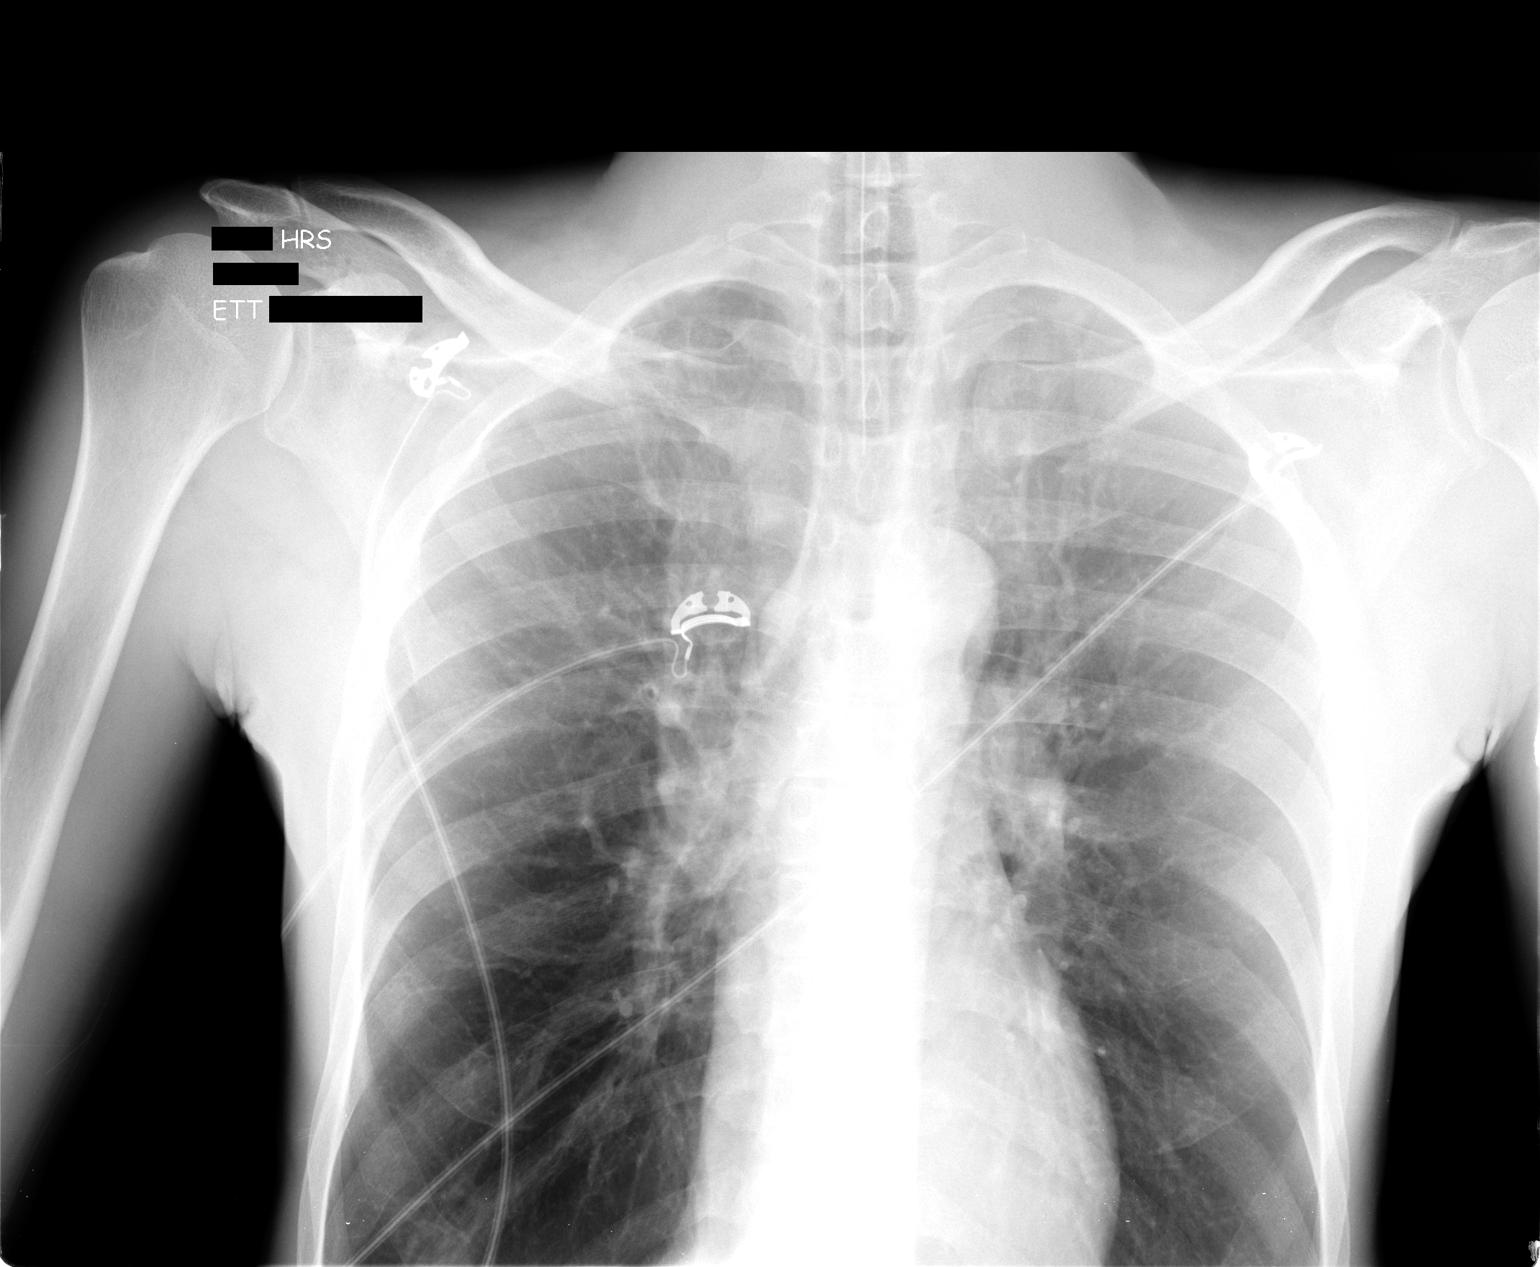

[1 of 1 positions shown; findings below may reference images not displayed]

FINDINGS: Endotracheal tube tip in satisfactory position
approximately 6 cm above the carina.  Cardiomediastinal silhouette
unremarkable and unchanged.  Hyperinflation and emphysematous
changes throughout both lungs.  Lungs clear.  Costophrenic angles
excluded from the current image.
IMPRESSION: Endotracheal tube tip in satisfactory position approximately 6 cm
above the carina.

## 2012-04-26 IMAGING — CT CT HEAD W/O CM
1 series · 15 of 30 positions shown, 19 images · non-contrast
Comparison: 11/15/2006

CLINICAL DATA: Shortness of breath.  Concern for stroke.

CT HEAD WITHOUT CONTRAST
TECHNIQUE: Contiguous axial images were obtained from the base of
the skull through the vertex without contrast.

[Series 2: headseq 4.8 h37s · axial · 0.53mm/px · z∈[+108,+265]mm · 15 of 36 slices shown, 19 images]
[im 2/36  brain]
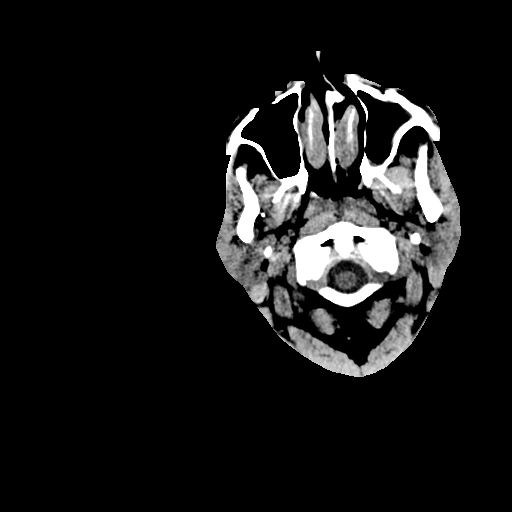
[im 2/36  bone]
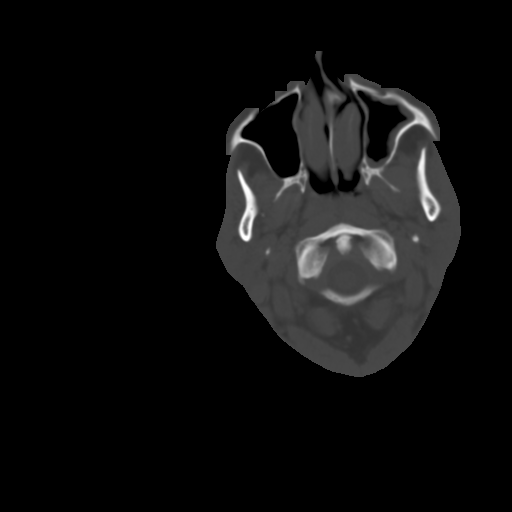
[im 4/36  brain]
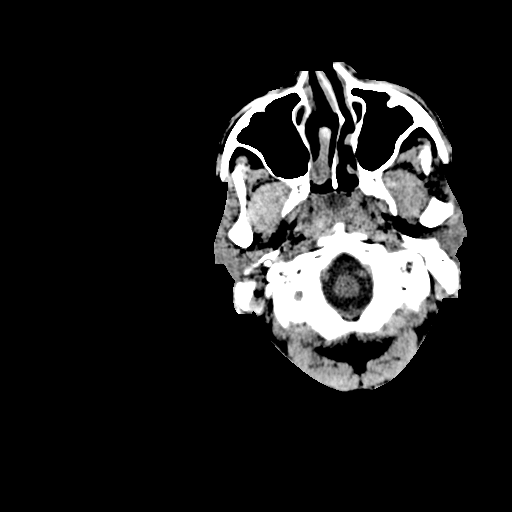
[im 7/36  brain]
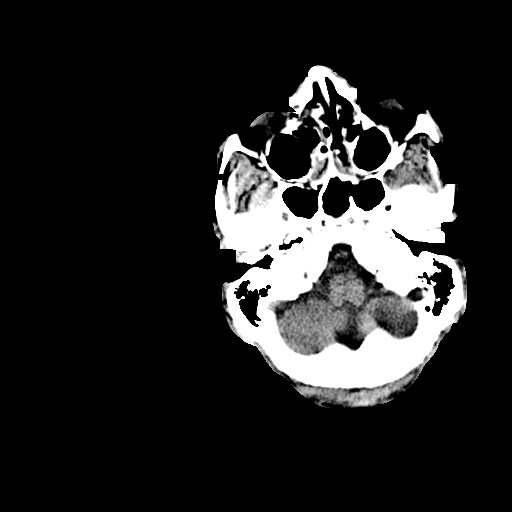
[im 9/36  brain]
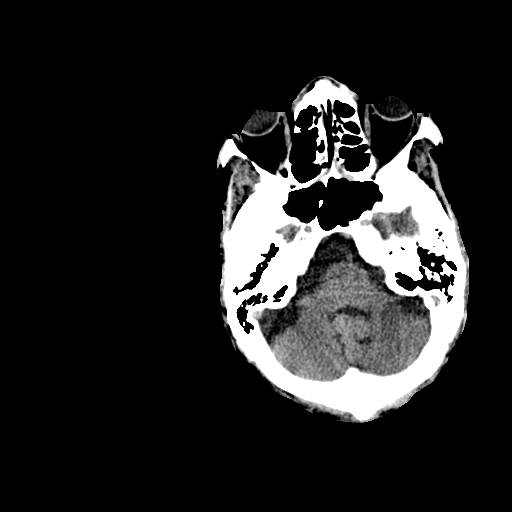
[im 11/36  brain]
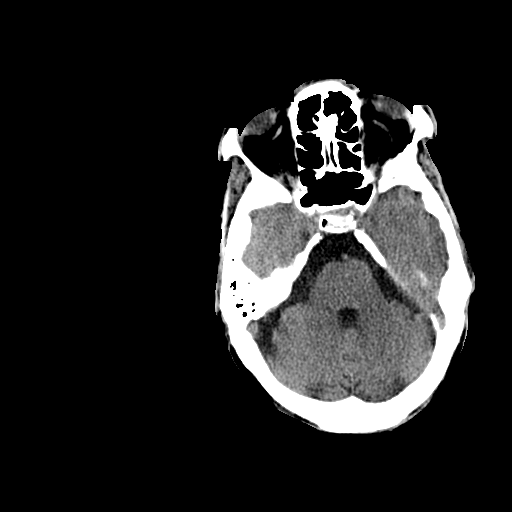
[im 11/36  bone]
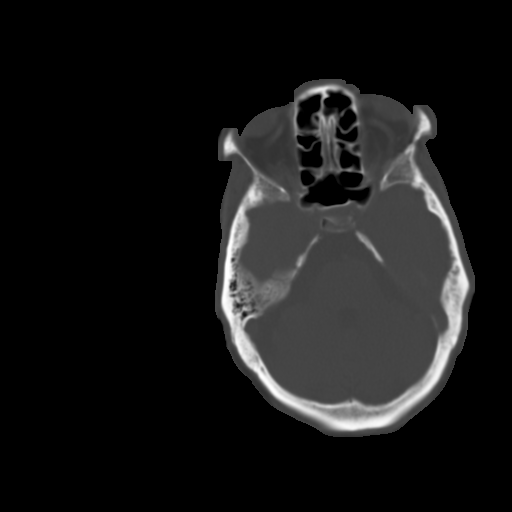
[im 14/36  brain]
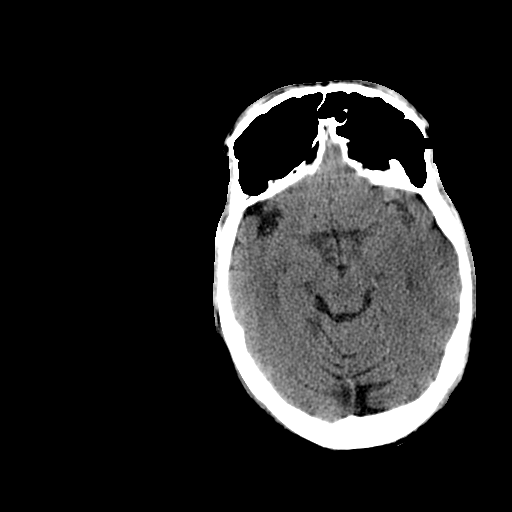
[im 16/36  brain]
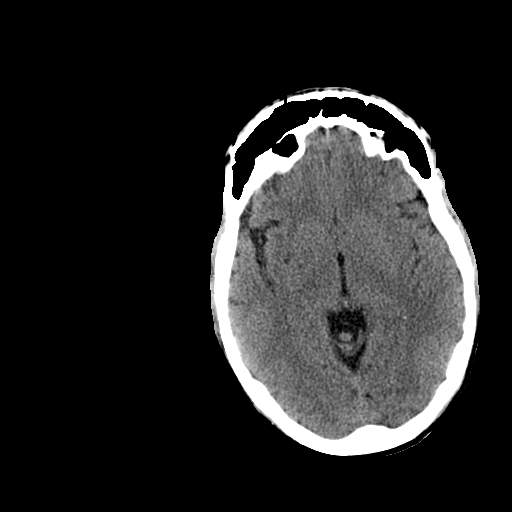
[im 19/36  brain]
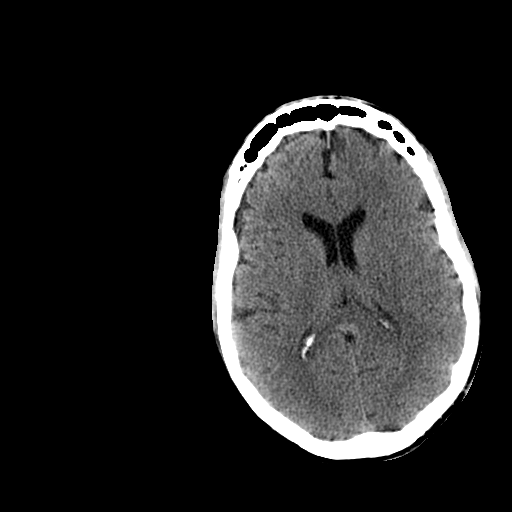
[im 20/36  brain]
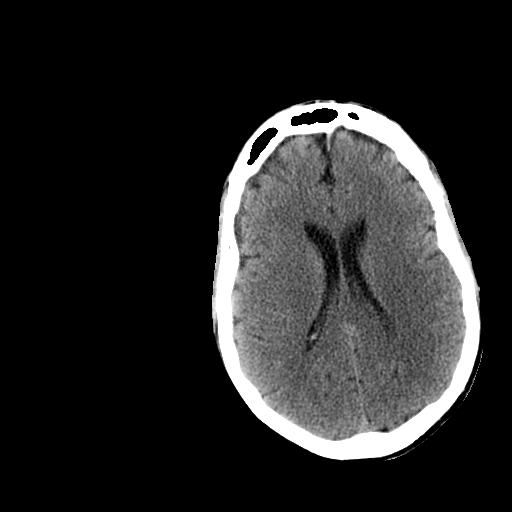
[im 20/36  bone]
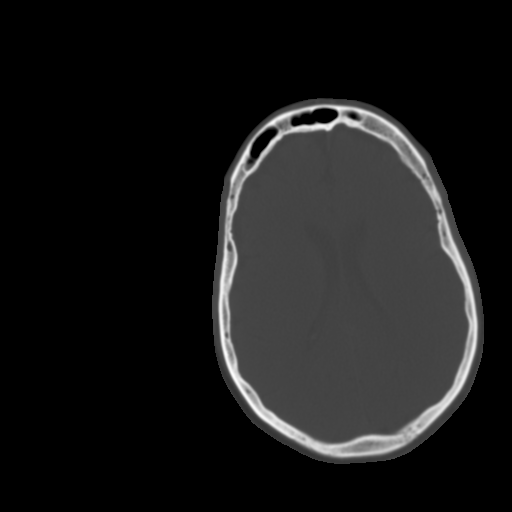
[im 22/36  brain]
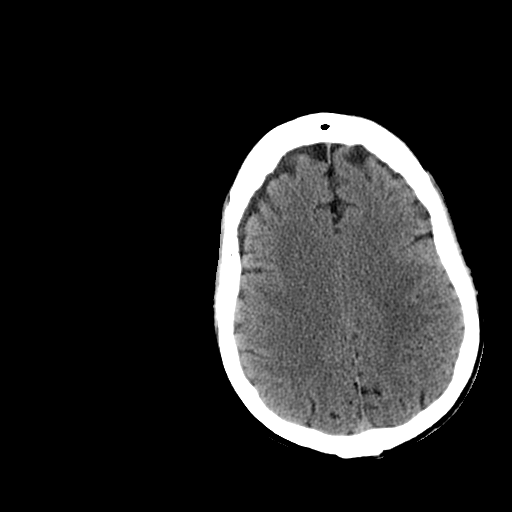
[im 25/36  brain]
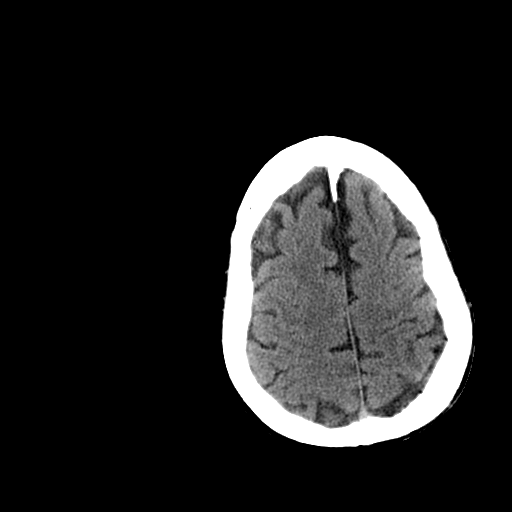
[im 27/36  brain]
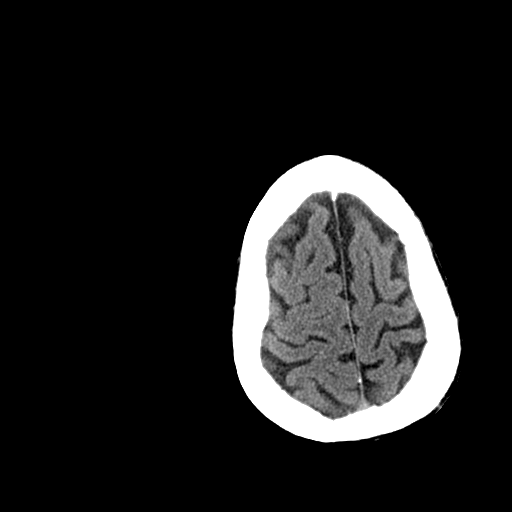
[im 29/36  brain]
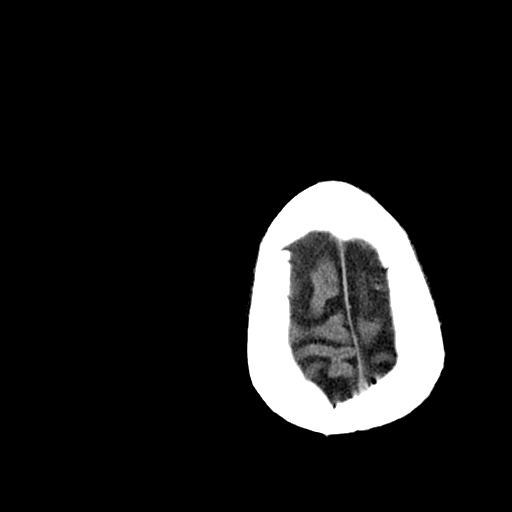
[im 29/36  bone]
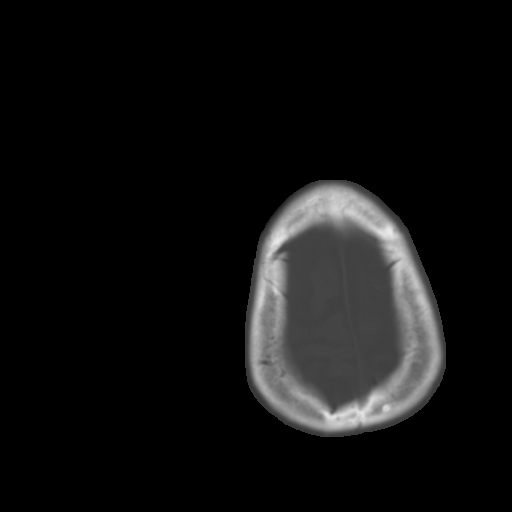
[im 32/36  brain]
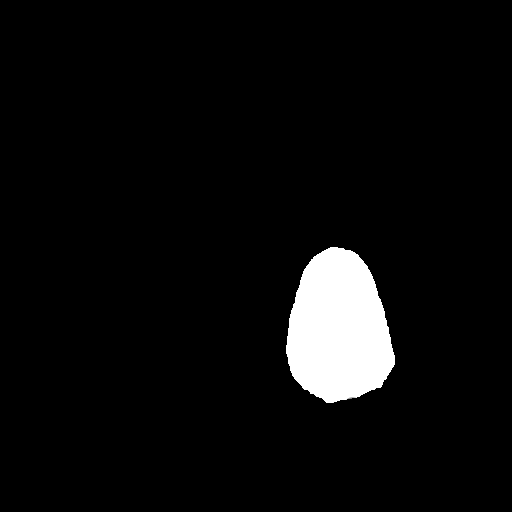
[im 34/36  brain]
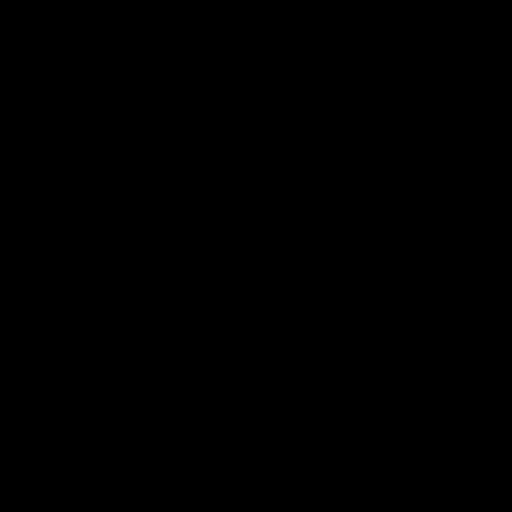

[15 of 30 positions shown; findings below may reference images not displayed]

FINDINGS: Mild cerebral atrophy.  Low attenuation change in the
deep white matter suggesting small vessel ischemia.  There is some
asymmetric effacement of sulci in the left parietal temporal region
which might suggest changes of early acute infarct.  No evidence of
acute intracranial hemorrhage.  No midline shift.  No abnormal
extra-axial fluid collections.  The gray-white matter junctions
remain distinct.  No ventricular dilatation.  No effacement of the
basal cisterns.  No depressed skull fractures.  Mucosal membrane
thickening in the left maxillary antrum and opacification of some
of the ethmoid air cells consistent with chronic sinusitis.
Incidental note of hyper aeration of the frontal sinuses.
IMPRESSION: Suggestion of mild asymmetric sulci effacement of the left
temporoparietal region which might represent early changes of acute
infarct.  No evidence of acute intracranial hemorrhage.  No mass
lesion identified.

## 2012-05-11 ENCOUNTER — Other Ambulatory Visit: Payer: Self-pay | Admitting: Family Medicine

## 2012-05-28 ENCOUNTER — Encounter: Payer: Self-pay | Admitting: Family Medicine

## 2012-05-28 ENCOUNTER — Ambulatory Visit (INDEPENDENT_AMBULATORY_CARE_PROVIDER_SITE_OTHER): Payer: Medicare Other | Admitting: Family Medicine

## 2012-05-28 VITALS — BP 122/80 | HR 96 | Resp 20 | Ht 67.0 in

## 2012-05-28 DIAGNOSIS — J441 Chronic obstructive pulmonary disease with (acute) exacerbation: Secondary | ICD-10-CM

## 2012-05-28 MED ORDER — PREDNISONE 10 MG PO TABS: ORAL_TABLET | ORAL | Status: DC

## 2012-05-28 MED ORDER — ALBUTEROL SULFATE (5 MG/ML) 0.5% IN NEBU
2.5000 mg | INHALATION_SOLUTION | Freq: Once | RESPIRATORY_TRACT | Status: AC
  Administered 2012-05-28: 2.5 mg via RESPIRATORY_TRACT

## 2012-05-28 MED ORDER — SODIUM CHLORIDE 0.9 % IV SOLN
125.0000 mg | Freq: Once | INTRAVENOUS | Status: DC
  Administered 2012-05-28: 130 mg via INTRAMUSCULAR

## 2012-05-28 MED ORDER — IPRATROPIUM BROMIDE 0.02 % IN SOLN
0.5000 mg | Freq: Once | RESPIRATORY_TRACT | Status: AC
  Administered 2012-05-28: 0.5 mg via RESPIRATORY_TRACT

## 2012-05-28 MED ORDER — DOXYCYCLINE HYCLATE 100 MG PO TABS: 100.0000 mg | ORAL_TABLET | Freq: Two times a day (BID) | ORAL | Status: DC

## 2012-05-28 NOTE — Progress Notes (Signed)
  Subjective:    Patient ID: EPIC TRIBBETT, male    DOB: August 30, 1950, 62 y.o.   [name]: [id number]  HPI  Patient presents with cough shortness of breath and wheezing for the past 24 hours. He has a history of COPD/asthma noted in his chart. He is on home oxygen. He quit smoking. He's been using his Advair however did not use his albuterol this morning. States that his cough is nonproductive he cannot get out of his chest. Denies chest pain  Review of Systems   GEN- denies fatigue, fever, weight loss,weakness, recent illness HEENT- denies eye drainage, change in vision, nasal discharge, CVS- denies chest pain, palpitations RESP- + SOB, +cough,+ wheeze ABD- denies N/V, change in stools, abd pain Neuro- denies headache, dizziness, syncope, seizure activity      Objective:   Physical Exam  GEN- NAD, alert and oriented x3 HEENT- PERRL, EOMI, non injected sclera, pink conjunctiva, MMM, oropharynx clear Neck- Supple, no LAD CVS-  Mild resting tachycardia, no murmur RESP-bilateral wheeze, decreased air movement at bases, mild rhonchi bilat,increased WOB EXT- No edema Pulses- Radial 2+  S/P neb and solumedrol 125mg , ambulating sat 93-94%, resting 95% , improved air movement and WOB        Assessment & Plan:

## 2012-05-28 NOTE — Patient Instructions (Signed)
Treating COPD exacerbation You were given a shot of steroids today Start the steroid pills tomorrow Start antibiotics today  Use the nebulizer every 4 hours while awake Go to ER if you get worse  F/U Dr. Lodema Hong as previous

## 2012-05-28 NOTE — Assessment & Plan Note (Signed)
We'll treat for acute exacerbation of COPD/asthma. He's required typically longer taper his steroids based on his chart review. She was given Solu-Medrol in the office I will put him on a prednisone taper also put him on doxycycline. He will use his nebulizer every 4 hours while awake for the next couple of days he understands when to go to the emergency room if needed. His oxygen levels rebounded up very nicely after nebulizer treatment and steroids in office

## 2012-05-28 NOTE — Addendum Note (Signed)
Addended by: Kandis Fantasia B on: 05/28/2012 03:12 PM   Modules accepted: Orders

## 2012-07-10 ENCOUNTER — Other Ambulatory Visit: Payer: Self-pay | Admitting: Family Medicine

## 2012-07-20 LAB — COMPREHENSIVE METABOLIC PANEL
AST: 18 U/L (ref 0–37)
Alkaline Phosphatase: 52 U/L (ref 39–117)
BUN: 16 mg/dL (ref 6–23)
Creat: 1.21 mg/dL (ref 0.50–1.35)

## 2012-07-20 LAB — LIPID PANEL
Cholesterol: 169 mg/dL (ref 0–200)
Total CHOL/HDL Ratio: 2.9 Ratio
Triglycerides: 49 mg/dL (ref <150)
VLDL: 10 mg/dL (ref 0–40)

## 2012-07-23 ENCOUNTER — Ambulatory Visit (INDEPENDENT_AMBULATORY_CARE_PROVIDER_SITE_OTHER): Payer: Medicare Other | Admitting: Family Medicine

## 2012-07-23 ENCOUNTER — Encounter: Payer: Self-pay | Admitting: Family Medicine

## 2012-07-23 VITALS — BP 118/80 | HR 94 | Resp 16 | Ht 67.0 in | Wt 146.0 lb

## 2012-07-23 DIAGNOSIS — R972 Elevated prostate specific antigen [PSA]: Secondary | ICD-10-CM

## 2012-07-23 DIAGNOSIS — I10 Essential (primary) hypertension: Secondary | ICD-10-CM

## 2012-07-23 DIAGNOSIS — Z0000 Encounter for general adult medical examination without abnormal findings: Secondary | ICD-10-CM

## 2012-07-23 DIAGNOSIS — J441 Chronic obstructive pulmonary disease with (acute) exacerbation: Secondary | ICD-10-CM

## 2012-07-23 DIAGNOSIS — E785 Hyperlipidemia, unspecified: Secondary | ICD-10-CM

## 2012-07-23 MED ORDER — PREDNISONE 5 MG PO TABS: 5.0000 mg | ORAL_TABLET | Freq: Two times a day (BID) | ORAL | Status: AC

## 2012-07-23 MED ORDER — ASPIRIN EC 81 MG PO TBEC: 81.0000 mg | DELAYED_RELEASE_TABLET | Freq: Every day | ORAL | Status: AC

## 2012-07-23 MED ORDER — ADULT MULTIVITAMIN W/MINERALS CH: 1.0000 | ORAL_TABLET | Freq: Every day | ORAL | Status: AC

## 2012-07-23 NOTE — Patient Instructions (Addendum)
F/u in early  November, call if you need me before   Please keep appointments with Dr Juanetta Gosling and Dr. Jerre Simon    Prednisone sent in for 5 days to help breathing.   Nebulizing machine is being sent to pharmacy  PLEASE check for the shingles vaccine at your pharmacy , you need this  NEW is aspirin and multivitamin one daily  Labs are excellent

## 2012-07-23 NOTE — Progress Notes (Signed)
Subjective:    Patient ID: [name], male    DOB: 05/16/1950, 62 y.o.   [name]: [id number]  HPI  Preventive Screening-Counseling & Management   Patient present here today for a Medicare annual wellness visit. 5 day h/o increased wheeze, cough and shortness of breath. Denies sputum, fever, chills Recently had labs and is also needing to review these   Current Problems (verified)   Medications Prior to Visit Allergies (verified)   PAST HISTORY  Family History  Social History Widow x 15 years, , father of 3 children, in no contact with him No current tobacco, alcohol or street drug use, past h/o all  3   Risk Factors  Current exercise habits:  None, but will commit to chair exercises  Dietary issues discussed:Low fat, low sugar   Cardiac risk factors:   Depression Screen  (Note: if answer to either of the following is "Yes", a more complete depression screening is indicated)   Over the past two weeks, have you felt down, depressed or hopeless? No  Over the past two weeks, have you felt little interest or pleasure in doing things? No  Have you lost interest or pleasure in daily life? No  Do you often feel hopeless? No  Do you cry easily over simple problems? No   Activities of Daily Living  In your present state of health, do you have any difficulty performing the following activities?  Driving?: Uncertain, unable due to poor breathing, has no license currently Managing money?: yes gets help through [employer], she tells him how much he owes and she fills out money order Feeding yourself?:yes , gets short of breath, sometimes eats at restaurant Getting from bed to chair?:yes, short of breath Climbing a flight of stairs?:yes, gets short of breath Preparing food and eating?:yes with food prep Bathing or showering?yes gets short of breath Getting dressed?:No Getting to the toilet?:No Using the toilet?:No Moving around from place to place?: yes limited by  breathing difficulty  Fall Risk Assessment In the past year have you fallen or had a near fall?:No Are you currently taking any medications that make you dizzy?:No   Hearing Difficulties: No Do you often ask people to speak up or repeat themselves?:No Do you experience ringing or noises in your ears?:No Do you have difficulty understanding soft or whispered voices?:No  Cognitive Testing  Alert? Yes Normal Appearance?Yes  Oriented to person? Yes Place? Yes  Time? Yes  Displays appropriate judgment?Yes  Can read the correct time from a watch face? yes Are you having problems remembering things?No  Advanced Directives have been discussed with the patient?Yes , full code   List the Names of Other Physician/Practitioners you currently use: Dr Jerre Simon, Dr Juanetta Gosling   Indicate any recent Medical Services you may have received from other than [facility] providers in the past year (date may be approximate).   Assessment:    Annual Wellness Exam   Plan:    During the course of the visit the patient was educated and counseled about appropriate screening and preventive services including:  A healthy diet is rich in fruit, vegetables and whole grains. Poultry fish, nuts and beans are a healthy choice for protein rather then red meat. A low sodium diet and drinking 64 ounces of water daily is generally recommended. Oils and sweet should be limited. Carbohydrates especially for those who are diabetic or overweight, should be limited to 30-45 gram per meal. It is important to eat on a regular schedule, at least  3 times daily. Snacks should be primarily fruits, vegetables or nuts. It is important that you exercise regularly at least 30 minutes 5 times a week. If you develop chest pain, have severe difficulty breathing, or feel very tired, stop exercising immediately and seek medical attention  Immunization reviewed and updated. Cancer screening reviewed and updated    Patient Instructions (the  written plan) was given to the patient.  Medicare Attestation  I have personally reviewed:  The patient's medical and social history  Their use of alcohol, tobacco or illicit drugs  Their current medications and supplements  The patient's functional ability including ADLs,fall risks, home safety risks, cognitive, and hearing and visual impairment  Diet and physical activities  Evidence for depression or mood disorders  The patient's weight, height, BMI, and visual acuity have been recorded in the chart. I have made referrals, counseling, and provided education to the patient based on review of the above and I have provided the patient with a written personalized care plan for preventive services.     Review of Systems     Objective:   Physical Exam  Patient alert and oriented and in mild  cardiopulmonary distress with activity  HEENT: No facial asymmetry, EOMI, no sinus tenderness,  oropharynx pink and moist.  Neck supple no adenopathy.  Chest: decreased air entry, scattered wheezes, no crackles  CVS: S1, S2 no murmurs, no S3.  ABD: Soft non tender. Bowel sounds normal.  Ext: No edema  MS: Adequate ROM spine, shoulders, hips and knees.  Skin: Intact, no ulcerations or rash noted.  Psych: Good eye contact, flat  affect. Memory intact not anxious but  depressed appearing.  CNS: CN 2-12 intact, power, tone and sensation normal throughout.       Assessment & Plan:

## 2012-07-25 DIAGNOSIS — Z0000 Encounter for general adult medical examination without abnormal findings: Secondary | ICD-10-CM | POA: Insufficient documentation

## 2012-07-25 NOTE — Assessment & Plan Note (Signed)
Being followd by urology, and he is aware of his upcoming appt

## 2012-07-25 NOTE — Assessment & Plan Note (Addendum)
Annual wellness as documented. Pt has severe lung disease, which is his greatest health challenge. He is socially isolated, and repeatedly describes his life as being isolated, and his basic distrust of good intentions by anyone. He gets assistance with organizing bill payments through a hospital staff member No interest yet in giving up his house , which none of his 3 estranged children want , to live in a family/ group home as of yet, although even his ability to prepare his own food is at times severely limited by pulmonary distress. I again encourage  Him to reconsider this Pt's living situation deteriorated significantly after the loss of his wife , I believe

## 2012-07-25 NOTE — Assessment & Plan Note (Signed)
3 day h/o increased wheeze and cough, short course of prednisone prescribed

## 2012-07-25 NOTE — Assessment & Plan Note (Signed)
Well controlled, though LDL slightly above goal. No med chane Hyperlipidemia:Low fat diet discussed and encouraged.

## 2012-07-25 NOTE — Assessment & Plan Note (Signed)
Controlled, no change in medication  

## 2012-08-28 ENCOUNTER — Other Ambulatory Visit: Payer: Self-pay | Admitting: Family Medicine

## 2012-09-04 ENCOUNTER — Other Ambulatory Visit: Payer: Self-pay | Admitting: Family Medicine

## 2012-09-17 ENCOUNTER — Other Ambulatory Visit: Payer: Self-pay | Admitting: Family Medicine

## 2012-11-11 ENCOUNTER — Encounter: Payer: Self-pay | Admitting: Family Medicine

## 2012-11-11 ENCOUNTER — Ambulatory Visit (INDEPENDENT_AMBULATORY_CARE_PROVIDER_SITE_OTHER): Payer: Medicare Other | Admitting: Family Medicine

## 2012-11-11 ENCOUNTER — Encounter (INDEPENDENT_AMBULATORY_CARE_PROVIDER_SITE_OTHER): Payer: Self-pay

## 2012-11-11 VITALS — BP 128/82 | HR 89 | Resp 16 | Ht 67.0 in | Wt 146.4 lb

## 2012-11-11 DIAGNOSIS — Z23 Encounter for immunization: Secondary | ICD-10-CM

## 2012-11-11 DIAGNOSIS — E785 Hyperlipidemia, unspecified: Secondary | ICD-10-CM

## 2012-11-11 DIAGNOSIS — R7309 Other abnormal glucose: Secondary | ICD-10-CM

## 2012-11-11 DIAGNOSIS — J45909 Unspecified asthma, uncomplicated: Secondary | ICD-10-CM

## 2012-11-11 DIAGNOSIS — J449 Chronic obstructive pulmonary disease, unspecified: Secondary | ICD-10-CM

## 2012-11-11 DIAGNOSIS — R7302 Impaired glucose tolerance (oral): Secondary | ICD-10-CM

## 2012-11-11 DIAGNOSIS — I10 Essential (primary) hypertension: Secondary | ICD-10-CM

## 2012-11-11 DIAGNOSIS — R972 Elevated prostate specific antigen [PSA]: Secondary | ICD-10-CM

## 2012-11-11 DIAGNOSIS — E559 Vitamin D deficiency, unspecified: Secondary | ICD-10-CM

## 2012-11-11 MED ORDER — TRIAMTERENE-HCTZ 37.5-25 MG PO TABS: ORAL_TABLET | ORAL | Status: DC

## 2012-11-11 NOTE — Patient Instructions (Signed)
F/u with rectal in 4 months, call if you need me before  Flu vaccine today  Happy that prostate check is going well  Fasting lipid, cmp and HBa1C  Vitamin D in 4 month, before visit

## 2012-11-11 NOTE — Progress Notes (Signed)
  Subjective:    Patient ID: [name], male    DOB: Oct 30, 1950, 62 y.o.   [name]: [id number]  HPI The PT is here for follow up and re-evaluation of chronic medical conditions, medication management and review of any available recent lab and radiology data.  Preventive health is updated, specifically  Cancer screening and Immunization.   . The PT denies any adverse reactions to current medications since the last visit.  There are no new concerns.  There are no specific complaints       Review of Systems See HPI Denies recent fever or chills. Denies sinus pressure, nasal congestion, ear pain or sore throat. Denies chest congestion, productive cough or wheezing.Chronic chest tightness and difficulty breathing, slightly increased with change in season Denies chest pains, palpitations and leg swelling Denies abdominal pain, nausea, vomiting,diarrhea or constipation.   Denies dysuria, frequency, hesitancy or incontinence. Denies joint pain, swelling and limitation in mobility. Denies headaches, seizures, numbness, or tingling. Denies depression, anxiety or insomnia. Denies skin break down or rash.   `    Objective:   Physical Exam Patient alert and oriented and in no cardiopulmonary distress.  HEENT: No facial asymmetry, EOMI, no sinus tenderness,  oropharynx pink and moist.  Neck supple no adenopathy.  Chest: Clear to auscultation bilaterally.Decreased air entry throughout , no wheezes  CVS: S1, S2 no murmurs, no S3.  ABD: Soft non tender. Bowel sounds normal.  Ext: No edema  MS: Adequate ROM spine, shoulders, hips and knees.  Skin: Intact, no ulcerations or rash noted.  Psych: Good eye contact, normal affect. Memory intact not anxious or depressed appearing.  CNS: CN 2-12 intact, power, tone and sensation normal throughout.        Assessment & Plan:

## 2012-11-12 DIAGNOSIS — J449 Chronic obstructive pulmonary disease, unspecified: Secondary | ICD-10-CM | POA: Insufficient documentation

## 2012-11-12 NOTE — Assessment & Plan Note (Signed)
Currently stable, continue current meds 

## 2012-11-12 NOTE — Assessment & Plan Note (Signed)
Patient educated about the importance of limiting  Carbohydrate intake , the need to commit to daily physical activity for a minimum of 30 minutes , and to commit weight loss. The fact that changes in all these areas will reduce or eliminate all together the development of diabetes is stressed.   Updated lab needed Steroid has also increased glucose intolerance

## 2012-11-12 NOTE — Assessment & Plan Note (Signed)
Currently stable, continue current meds.  Also followed by pulmonary

## 2012-11-12 NOTE — Assessment & Plan Note (Signed)
Controlled, no change in medication DASH diet and commitment to daily physical activity for a minimum of 30 minutes discussed and encouraged, as a part of hypertension management. The importance of attaining a healthy weight is also discussed.  

## 2012-11-12 NOTE — Assessment & Plan Note (Signed)
Saw urology recently, next appt is in 6 month, so apparently stable

## 2012-11-12 NOTE — Assessment & Plan Note (Signed)
Hyperlipidemia:Low fat diet discussed and encouraged.  Updated lab next visit 

## 2012-11-13 ENCOUNTER — Other Ambulatory Visit: Payer: Self-pay | Admitting: Family Medicine

## 2012-12-18 ENCOUNTER — Other Ambulatory Visit: Payer: Self-pay | Admitting: Family Medicine

## 2013-01-05 ENCOUNTER — Other Ambulatory Visit: Payer: Self-pay | Admitting: Family Medicine

## 2013-01-16 ENCOUNTER — Other Ambulatory Visit: Payer: Self-pay | Admitting: Family Medicine

## 2013-01-18 ENCOUNTER — Other Ambulatory Visit: Payer: Self-pay | Admitting: Family Medicine

## 2013-03-09 LAB — LIPID PANEL
Cholesterol: 177 mg/dL (ref 0–200)
HDL: 57 mg/dL (ref >39)
LDL Cholesterol: 111 mg/dL — ABNORMAL HIGH (ref 0–99)
Total CHOL/HDL Ratio: 3.1 Ratio
Triglycerides: 46 mg/dL (ref <150)
VLDL: 9 mg/dL (ref 0–40)

## 2013-03-09 LAB — COMPREHENSIVE METABOLIC PANEL
ALT: 17 U/L (ref 0–53)
AST: 19 U/L (ref 0–37)
Albumin: 4.6 g/dL (ref 3.5–5.2)
Alkaline Phosphatase: 47 U/L (ref 39–117)
BUN: 16 mg/dL (ref 6–23)
CO2: 32 mEq/L (ref 19–32)
Calcium: 9.6 mg/dL (ref 8.4–10.5)
Chloride: 99 mEq/L (ref 96–112)
Creat: 1.08 mg/dL (ref 0.50–1.35)
Glucose, Bld: 107 mg/dL — ABNORMAL HIGH (ref 70–99)
Potassium: 3.9 mEq/L (ref 3.5–5.3)
Sodium: 139 mEq/L (ref 135–145)
Total Bilirubin: 0.5 mg/dL (ref 0.2–1.2)
Total Protein: 6.6 g/dL (ref 6.0–8.3)

## 2013-03-09 LAB — HEMOGLOBIN A1C
Hgb A1c MFr Bld: 6.1 % — ABNORMAL HIGH (ref <5.7)
Mean Plasma Glucose: 128 mg/dL — ABNORMAL HIGH (ref <117)

## 2013-03-10 LAB — VITAMIN D 25 HYDROXY (VIT D DEFICIENCY, FRACTURES): Vit D, 25-Hydroxy: 24 ng/mL — ABNORMAL LOW (ref 30–89)

## 2013-03-17 ENCOUNTER — Encounter: Payer: Self-pay | Admitting: Family Medicine

## 2013-03-17 ENCOUNTER — Ambulatory Visit (INDEPENDENT_AMBULATORY_CARE_PROVIDER_SITE_OTHER): Payer: Medicare HMO | Admitting: Family Medicine

## 2013-03-17 ENCOUNTER — Encounter (INDEPENDENT_AMBULATORY_CARE_PROVIDER_SITE_OTHER): Payer: Self-pay

## 2013-03-17 VITALS — BP 130/80 | HR 92 | Resp 16 | Ht 67.0 in | Wt 134.0 lb

## 2013-03-17 DIAGNOSIS — Z609 Problem related to social environment, unspecified: Secondary | ICD-10-CM

## 2013-03-17 DIAGNOSIS — R7309 Other abnormal glucose: Secondary | ICD-10-CM

## 2013-03-17 DIAGNOSIS — Z604 Social exclusion and rejection: Secondary | ICD-10-CM | POA: Insufficient documentation

## 2013-03-17 DIAGNOSIS — R634 Abnormal weight loss: Secondary | ICD-10-CM

## 2013-03-17 DIAGNOSIS — E559 Vitamin D deficiency, unspecified: Secondary | ICD-10-CM

## 2013-03-17 DIAGNOSIS — I10 Essential (primary) hypertension: Secondary | ICD-10-CM

## 2013-03-17 DIAGNOSIS — M79609 Pain in unspecified limb: Secondary | ICD-10-CM

## 2013-03-17 DIAGNOSIS — R7302 Impaired glucose tolerance (oral): Secondary | ICD-10-CM

## 2013-03-17 DIAGNOSIS — M79601 Pain in right arm: Secondary | ICD-10-CM

## 2013-03-17 DIAGNOSIS — E785 Hyperlipidemia, unspecified: Secondary | ICD-10-CM

## 2013-03-17 DIAGNOSIS — R972 Elevated prostate specific antigen [PSA]: Secondary | ICD-10-CM

## 2013-03-17 DIAGNOSIS — J449 Chronic obstructive pulmonary disease, unspecified: Secondary | ICD-10-CM

## 2013-03-17 MED ORDER — ERGOCALCIFEROL 1.25 MG (50000 UT) PO CAPS: 50000.0000 [IU] | ORAL_CAPSULE | ORAL | Status: DC

## 2013-03-17 MED ORDER — PRAVASTATIN SODIUM 40 MG PO TABS: 40.0000 mg | ORAL_TABLET | Freq: Every evening | ORAL | Status: DC

## 2013-03-17 MED ORDER — TRIAMTERENE-HCTZ 37.5-25 MG PO TABS: ORAL_TABLET | ORAL | Status: DC

## 2013-03-17 NOTE — Patient Instructions (Signed)
F/u in 3 month, call if you need me before  Blood tests and blood pressure are good.  Your breathing has been doing very well.  We will ask a Education officer, museum to visit with you, I really do hope that you follow through on moving into a family hnme where you will be cared for and  Interact with people  I am concerned about your weight loss, and you do say that you do not cook or eat much  Fall Prevention and Home Safety Falls cause injuries and can affect all age groups. It is possible to prevent falls.  HOW TO PREVENT FALLS  Wear shoes with rubber soles that do not have an opening for your toes.  Keep the inside and outside of your house well lit.  Use night lights throughout your home.  Remove clutter from floors.  Clean up floor spills.  Remove throw rugs or fasten them to the floor with carpet tape.  Do not place electrical cords across pathways.  Put grab bars by your tub, shower, and toilet. Do not use towel bars as grab bars.  Put handrails on both sides of the stairway. Fix loose handrails.  Do not climb on stools or stepladders, if possible.  Do not wax your floors.  Repair uneven or unsafe sidewalks, walkways, or stairs.  Keep items you use a lot within reach.  Be aware of pets.  Keep emergency numbers next to the telephone.  Put smoke detectors in your home and near bedrooms. Ask your doctor what other things you can do to prevent falls. Document Released: 10/20/2008 Document Revised: 06/25/2011 Document Reviewed: 03/26/2011 [facility] Patient Information 2014 [place], [place].

## 2013-03-21 ENCOUNTER — Telehealth: Payer: Self-pay | Admitting: Family Medicine

## 2013-03-21 DIAGNOSIS — R634 Abnormal weight loss: Secondary | ICD-10-CM | POA: Insufficient documentation

## 2013-03-21 DIAGNOSIS — M79601 Pain in right arm: Secondary | ICD-10-CM | POA: Insufficient documentation

## 2013-03-21 NOTE — Progress Notes (Signed)
   Subjective:    Patient ID: [name], male    DOB: [birth year], 63 y.o.   [name]: [id number]  HPI The PT is here for follow up and re-evaluation of chronic medical conditions, medication management and review of any available recent lab and radiology data.  Preventive health is updated, specifically  Cancer screening and Immunization.   Continues to feel socially isolated, alone most of the time with poor oral intake and weight loss, states now willing to live in a home The PT denies any adverse reactions to current medications since the last visit.  Right arm pain for past 3 weeks disturbing him at night, no recent trauma      Review of Systems See HPI Denies recent fever or chills. Denies sinus pressure, nasal congestion, ear pain or sore throat. Denies chest congestion, productive cough or wheezing. Denies chest pains, palpitations and leg swelling Denies abdominal pain, nausea, vomiting,diarrhea or constipation.   Denies dysuria, frequency, hesitancy or incontinence.  Denies headaches, seizures, numbness, or tingling. Denies skin break down or rash.        Objective:   Physical Exam BP 130/80  Pulse 92  Resp 16  Ht 5\' 7"  (1.702 m)  Wt 134 lb (60.782 kg)  BMI 20.98 kg/m2  SpO2 94% Patient alert and oriented and in no cardiopulmonary distress.  HEENT: No facial asymmetry, EOMI, no sinus tenderness,  oropharynx pink and moist.  Neck adequate ROM, no jVD,  no adenopathy.  Chest: Clear to auscultation bilaterally.  CVS: S1, S2 no murmurs, no S3.  ABD: Soft non tender. Bowel sounds normal.  Ext: No edema  MS: Adequate ROM spine, shoulders, hips and knees.  Skin: Intact, no ulcerations or rash noted.  Psych: Good eye contact, flat  affect. Memory intact not anxious but  depressed appearing.  CNS: CN 2-12 intact, power, tone and sensation normal throughout.        Assessment & Plan:  HYPERTENSION Controlled, no change in medication   Asthma with  COPD (chronic obstructive pulmonary disease) Controlled, no change in medication   IGT (impaired glucose tolerance) Pt to follow low carb diet  HYPERLIPIDEMIA Uncontrolled with increase in LDL Hyperlipidemia:Low fat diet discussed and encouraged.  No med change  Social isolation Refer to Education officer, museum for help with placement , pt now states he is willing and ready to live in a family home  PSA, INCREASED Being followed by urology, Dr.  Michela Pitcher  Unintentional weight loss Poor oral intake pt lives alone and reports buying most of the food he eats already prepared and eating once per day, refer to social services  Arm pain, right recent onset, keeps him awake, no trauma. Trial of gabapentin

## 2013-03-21 NOTE — Assessment & Plan Note (Signed)
Pt to follow low carb diet

## 2013-03-21 NOTE — Assessment & Plan Note (Signed)
Poor oral intake pt lives alone and reports buying most of the food he eats already prepared and eating once per day, refer to social services

## 2013-03-21 NOTE — Assessment & Plan Note (Signed)
Controlled, no change in medication  

## 2013-03-21 NOTE — Assessment & Plan Note (Signed)
recent onset, keeps him awake, no trauma. Trial of gabapentin

## 2013-03-21 NOTE — Assessment & Plan Note (Signed)
Uncontrolled with increase in LDL Hyperlipidemia:Low fat diet discussed and encouraged.  No med change

## 2013-03-21 NOTE — Assessment & Plan Note (Signed)
Being followed by urology, Dr.  Michela Pitcher

## 2013-03-21 NOTE — Assessment & Plan Note (Signed)
Refer to Education officer, museum for help with placement , pt now states he is willing and ready to live in a family home

## 2013-03-21 NOTE — Telephone Encounter (Signed)
During recent visit , I forgot to send in medication for the arm pain he c/o of, I have entered gabapentin 100 mg, at bedtime, pls send in after you speak with him Also have you been able to get in touch with [facility] or other as far as a Education officer, museum top evaluate his situation? Pls let me know

## 2013-03-22 MED ORDER — GABAPENTIN 100 MG PO CAPS: 100.0000 mg | ORAL_CAPSULE | Freq: Every day | ORAL | Status: DC

## 2013-03-22 NOTE — Telephone Encounter (Signed)
Patient aware and med sent in

## 2013-03-22 NOTE — Addendum Note (Signed)
Addended by: Eual Fines on: 03/22/2013 08:15 AM   Modules accepted: Orders

## 2013-05-07 ENCOUNTER — Other Ambulatory Visit: Payer: Self-pay | Admitting: Family Medicine

## 2013-05-15 ENCOUNTER — Other Ambulatory Visit: Payer: Self-pay | Admitting: Family Medicine

## 2013-05-28 ENCOUNTER — Encounter (INDEPENDENT_AMBULATORY_CARE_PROVIDER_SITE_OTHER): Payer: Self-pay

## 2013-05-28 ENCOUNTER — Encounter: Payer: Self-pay | Admitting: Family Medicine

## 2013-05-28 ENCOUNTER — Ambulatory Visit (INDEPENDENT_AMBULATORY_CARE_PROVIDER_SITE_OTHER): Payer: Medicare HMO | Admitting: Family Medicine

## 2013-05-28 VITALS — BP 138/86 | HR 110 | Resp 20 | Ht 67.0 in | Wt 134.1 lb

## 2013-05-28 DIAGNOSIS — J441 Chronic obstructive pulmonary disease with (acute) exacerbation: Secondary | ICD-10-CM

## 2013-05-28 DIAGNOSIS — I10 Essential (primary) hypertension: Secondary | ICD-10-CM

## 2013-05-28 MED ORDER — PREDNISONE (PAK) 10 MG PO TABS: ORAL_TABLET | ORAL | Status: DC

## 2013-05-28 MED ORDER — ALBUTEROL SULFATE (2.5 MG/3ML) 0.083% IN NEBU
2.5000 mg | INHALATION_SOLUTION | Freq: Once | RESPIRATORY_TRACT | Status: AC
  Administered 2013-05-28: 2.5 mg via RESPIRATORY_TRACT

## 2013-05-28 MED ORDER — METHYLPREDNISOLONE ACETATE 80 MG/ML IJ SUSP
120.0000 mg | Freq: Once | INTRAMUSCULAR | Status: AC
  Administered 2013-05-28: 120 mg via INTRAMUSCULAR

## 2013-05-28 MED ORDER — IPRATROPIUM BROMIDE 0.02 % IN SOLN
0.5000 mg | Freq: Once | RESPIRATORY_TRACT | Status: AC
  Administered 2013-05-28: 0.5 mg via RESPIRATORY_TRACT

## 2013-05-28 NOTE — Progress Notes (Signed)
   Subjective:    Patient ID: [name], male    DOB: [birth year], 63 y.o.   [name]: [id number]  HPI 2 day h/o excessive SOB, no fever, chills or productive cough, used last neb treatment at  Home approx 3 hours ago and still struggling to breathe Has medications at home which he is using as prescribed, no known trigger to current deterioration    Review of Systems    See HPI Denies recent fever or chills. Denies sinus pressure, nasal congestion, ear pain or sore throat. Denies chest congestion or  productive cough  Denies chest pains, palpitations and leg swelling Denies abdominal pain, nausea, vomiting,diarrhea or constipation.    Denies joint pain, swelling and limitation in mobility.  Denies skin break down or rash.     Objective:   Physical Exam BP 138/86  Pulse 110  Resp 20  Ht 5\' 7"  (1.702 m)  Wt 134 lb 1.3 oz (60.818 kg)  BMI 20.99 kg/m2  SpO2 91% Patient alert and oriented and in no respiratory distress.Using accessory muscles of respiration , sitting hunched forward, clearly working at breathing  HEENT: No facial asymmetry, EOMI, no sinus tenderness,  oropharynx pink and moist.  Neck supple no adenopathy.  Chest: Marked reduced air entry throughout, high pitched wheezes, no crackles  CVS: S1, S2 no murmurs, no S3.  ABD: Soft non tender. Bowel sounds normal.  Ext: No edema  .        Assessment & Plan:  CHRONIC OBSTRUCTIVE PULMONARY DISEASE, ACUTE EXACERBATION 2 day h/o acute symptom onset progressively worsening. Neb in office, and de[pomedrol followed by oral prednisone  HYPERTENSION Controlled, no change in medication

## 2013-05-28 NOTE — Patient Instructions (Signed)
F/u as before  You are having a flare up of breathing problems  Neb treatment in office AND STEROID INJECTION, also TAKE PREDNISONE TABLETS AS PRESCRIBED FOR YOU TODAY  iF YOUR BREATHING GET WORSE YOU WILL NEED TO GO TO THE ED

## 2013-05-29 NOTE — Assessment & Plan Note (Signed)
2 day h/o acute symptom onset progressively worsening. Neb in office, and de[pomedrol followed by oral prednisone

## 2013-05-29 NOTE — Assessment & Plan Note (Signed)
Controlled, no change in medication  

## 2013-06-16 ENCOUNTER — Ambulatory Visit (INDEPENDENT_AMBULATORY_CARE_PROVIDER_SITE_OTHER): Payer: Medicare HMO | Admitting: Family Medicine

## 2013-06-16 ENCOUNTER — Encounter: Payer: Self-pay | Admitting: Family Medicine

## 2013-06-16 VITALS — BP 124/80 | HR 90 | Resp 16 | Wt 138.8 lb

## 2013-06-16 DIAGNOSIS — J449 Chronic obstructive pulmonary disease, unspecified: Secondary | ICD-10-CM

## 2013-06-16 DIAGNOSIS — R7301 Impaired fasting glucose: Secondary | ICD-10-CM

## 2013-06-16 DIAGNOSIS — F32A Depression, unspecified: Secondary | ICD-10-CM

## 2013-06-16 DIAGNOSIS — T50905A Adverse effect of unspecified drugs, medicaments and biological substances, initial encounter: Secondary | ICD-10-CM

## 2013-06-16 DIAGNOSIS — F3289 Other specified depressive episodes: Secondary | ICD-10-CM

## 2013-06-16 DIAGNOSIS — R7309 Other abnormal glucose: Secondary | ICD-10-CM

## 2013-06-16 DIAGNOSIS — Z604 Social exclusion and rejection: Secondary | ICD-10-CM

## 2013-06-16 DIAGNOSIS — F329 Major depressive disorder, single episode, unspecified: Secondary | ICD-10-CM

## 2013-06-16 DIAGNOSIS — T50904A Poisoning by unspecified drugs, medicaments and biological substances, undetermined, initial encounter: Secondary | ICD-10-CM

## 2013-06-16 DIAGNOSIS — Z609 Problem related to social environment, unspecified: Secondary | ICD-10-CM

## 2013-06-16 DIAGNOSIS — J209 Acute bronchitis, unspecified: Secondary | ICD-10-CM

## 2013-06-16 DIAGNOSIS — R739 Hyperglycemia, unspecified: Secondary | ICD-10-CM

## 2013-06-16 DIAGNOSIS — I10 Essential (primary) hypertension: Secondary | ICD-10-CM

## 2013-06-16 DIAGNOSIS — R972 Elevated prostate specific antigen [PSA]: Secondary | ICD-10-CM

## 2013-06-16 MED ORDER — FLUOXETINE HCL 10 MG PO TABS: 10.0000 mg | ORAL_TABLET | Freq: Every day | ORAL | Status: DC

## 2013-06-16 MED ORDER — PENICILLIN V POTASSIUM 500 MG PO TABS: 500.0000 mg | ORAL_TABLET | Freq: Three times a day (TID) | ORAL | Status: DC

## 2013-06-16 NOTE — Patient Instructions (Addendum)
Annual physical exam in 3.5  month, call if you need me before  Penicillin is prescribed for bronchitis  CBC, cmp and hBA1C, , fasting lipid in 3.5 month  Hope you enjoy the Summer!

## 2013-06-18 NOTE — Assessment & Plan Note (Signed)
Unchanged, pt states he will not take any antidepressant, makes  Him feel funny, remains isolated since he no longer drinks reprotedly

## 2013-06-18 NOTE — Assessment & Plan Note (Signed)
Acute flare now resolved, continue maintenance medication daily

## 2013-06-18 NOTE — Progress Notes (Signed)
   Subjective:    Patient ID: [name], male    DOB: 07-Feb-1950, 63 y.o.   [name]: [id number]  HPI The PT is here for follow up and re-evaluation of chronic medical conditions, medication management and review of any available recent lab and radiology data.  Preventive health is updated, specifically  Cancer screening and Immunization.   Questions or concerns regarding consultations or procedures which the PT has had in the interim are  addressed. The PT denies any adverse reactions to current medications since the last visit.  There are no new concerns.  There are no specific complaints    5 day h/o increased chest congestion with cough productive of yellow sputum, he has had no fever or chills. Recently had acute flare of asthma which has since resolved Remains socially isolated and no interest in antidepressants, feels funny on them   Review of Systems See HPI Denies recent fever or chills. Denies sinus pressure, nasal congestion, ear pain or sore throat.  Denies chest pains, palpitations and leg swelling Denies abdominal pain, nausea, vomiting,diarrhea or constipation.   Denies dysuria, frequency, hesitancy or incontinence. Denies joint pain, swelling and limitation in mobility. Denies headaches, seizures, numbness, or tingling.  Denies skin break down or rash.        Objective:   Physical Exam BP 124/80  Pulse 90  Resp 16  Wt 138 lb 12.8 oz (62.959 kg)  SpO2 94% Patient alert and oriented and in no cardiopulmonary distress.  HEENT: No facial asymmetry, EOMI,   oropharynx pink and moist.  Neck supple no JVD, no mass.  Chest: decreased air entry bibasilar crackles, no wheezes CVS: S1, S2 no murmurs, no S3.  ABD: Soft non tender.   Ext: No edema  MS: Adequate ROM spine, shoulders, hips and knees.  Skin: Intact, no ulcerations or rash noted.  Psych: Good eye contact, flat  affect. Memory intact not anxious or depressed appearing.  CNS: CN 2-12 intact,  power,  normal throughout.no focal deficits noted.        Assessment & Plan:  Bronchitis, acute Acute infection, penicillin prescribed  HYPERTENSION Controlled, no change in medication   Asthma with COPD (chronic obstructive pulmonary disease) Acute flare now resolved, continue maintenance medication daily   Depression Unchanged, pt states he will not take any antidepressant, makes  Him feel funny, remains isolated since he no longer drinks reprotedly  Social isolation Unchanged, tHN has ben out to see him "ut the just come and go on about their business like everybody else" I think that little can be done to change this  PSA, INCREASED Followed by urology, Dr  Michela Pitcher  Drug-induced hyperglycemia Pt needs recurrent courses of steroids for COPD/asthma Patient educated about the importance of limiting  Carbohydrate intake , the need to commit to daily physical activity for a minimum of 30 minutes , and to commit weight loss. The fact that changes in all these areas will reduce or eliminate all together the development of diabetes is stressed.

## 2013-06-18 NOTE — Assessment & Plan Note (Signed)
Unchanged, tHN has ben out to see him "ut the just come and go on about their business like everybody else" I think that little can be done to change this

## 2013-06-18 NOTE — Assessment & Plan Note (Signed)
Controlled, no change in medication  

## 2013-06-18 NOTE — Assessment & Plan Note (Signed)
Acute infection, penicillin prescribed

## 2013-06-18 NOTE — Assessment & Plan Note (Signed)
Followed by urology, Dr  Michela Pitcher

## 2013-06-18 NOTE — Assessment & Plan Note (Signed)
Pt needs recurrent courses of steroids for COPD/asthma Patient educated about the importance of limiting  Carbohydrate intake , the need to commit to daily physical activity for a minimum of 30 minutes , and to commit weight loss. The fact that changes in all these areas will reduce or eliminate all together the development of diabetes is stressed.

## 2013-09-06 ENCOUNTER — Other Ambulatory Visit: Payer: Self-pay | Admitting: Family Medicine

## 2013-09-21 ENCOUNTER — Other Ambulatory Visit: Payer: Self-pay | Admitting: Family Medicine

## 2013-09-28 ENCOUNTER — Other Ambulatory Visit: Payer: Self-pay | Admitting: Family Medicine

## 2013-10-08 ENCOUNTER — Other Ambulatory Visit: Payer: Self-pay | Admitting: Family Medicine

## 2013-10-18 ENCOUNTER — Telehealth: Payer: Self-pay

## 2013-10-18 MED ORDER — PROMETHAZINE-DM 6.25-15 MG/5ML PO SYRP: 5.0000 mL | ORAL_SOLUTION | Freq: Four times a day (QID) | ORAL | Status: DC | PRN

## 2013-10-18 MED ORDER — PREDNISONE (PAK) 10 MG PO TABS: ORAL_TABLET | Freq: Every day | ORAL | Status: DC

## 2013-10-18 NOTE — Telephone Encounter (Signed)
pls send prednisone 10mg  dose pack x 6 days, and phenergan dm one tsp at bedtime as needed x 112 ml. I am assuming he was medically stable , as he does have severe asthma/COPD, if not  needs to be seen if this is the  case

## 2013-10-18 NOTE — Telephone Encounter (Signed)
Patient was stable. And will call back if he is not feeling better in 2 days.

## 2013-10-18 NOTE — Addendum Note (Signed)
Addended by: Denman George B on: 10/18/2013 11:34 AM   Modules accepted: Orders

## 2013-10-18 NOTE — Telephone Encounter (Signed)
Noted, thanks!

## 2013-10-19 ENCOUNTER — Other Ambulatory Visit: Payer: Self-pay | Admitting: Family Medicine

## 2013-11-09 ENCOUNTER — Other Ambulatory Visit: Payer: Self-pay | Admitting: Family Medicine

## 2013-11-09 LAB — HEMOGLOBIN A1C
HEMOGLOBIN A1C: 6.4 % — AB (ref <5.7)
Mean Plasma Glucose: 137 mg/dL — ABNORMAL HIGH (ref <117)

## 2013-11-09 LAB — CBC
HCT: 40 % (ref 39.0–52.0)
HEMOGLOBIN: 13.4 g/dL (ref 13.0–17.0)
MCH: 30 pg (ref 26.0–34.0)
MCHC: 33.5 g/dL (ref 30.0–36.0)
MCV: 89.5 fL (ref 78.0–100.0)
Platelets: 306 10*3/uL (ref 150–400)
RBC: 4.47 MIL/uL (ref 4.22–5.81)
RDW: 12.7 % (ref 11.5–15.5)
WBC: 5.2 10*3/uL (ref 4.0–10.5)

## 2013-11-09 LAB — COMPREHENSIVE METABOLIC PANEL
ALT: 18 U/L (ref 0–53)
AST: 13 U/L (ref 0–37)
Albumin: 3.7 g/dL (ref 3.5–5.2)
Alkaline Phosphatase: 98 U/L (ref 39–117)
BUN: 12 mg/dL (ref 6–23)
CO2: 29 meq/L (ref 19–32)
CREATININE: 0.9 mg/dL (ref 0.50–1.35)
Calcium: 9.3 mg/dL (ref 8.4–10.5)
Chloride: 101 mEq/L (ref 96–112)
GLUCOSE: 104 mg/dL — AB (ref 70–99)
Potassium: 4 mEq/L (ref 3.5–5.3)
SODIUM: 140 meq/L (ref 135–145)
TOTAL PROTEIN: 6.2 g/dL (ref 6.0–8.3)
Total Bilirubin: 0.3 mg/dL (ref 0.2–1.2)

## 2013-11-10 ENCOUNTER — Ambulatory Visit (INDEPENDENT_AMBULATORY_CARE_PROVIDER_SITE_OTHER): Payer: Medicare HMO

## 2013-11-10 ENCOUNTER — Encounter: Payer: Self-pay | Admitting: Family Medicine

## 2013-11-10 ENCOUNTER — Ambulatory Visit (INDEPENDENT_AMBULATORY_CARE_PROVIDER_SITE_OTHER): Payer: Medicare HMO | Admitting: Family Medicine

## 2013-11-10 VITALS — BP 130/82 | HR 96 | Resp 16 | Ht 67.0 in | Wt 143.1 lb

## 2013-11-10 DIAGNOSIS — J441 Chronic obstructive pulmonary disease with (acute) exacerbation: Secondary | ICD-10-CM

## 2013-11-10 DIAGNOSIS — I10 Essential (primary) hypertension: Secondary | ICD-10-CM

## 2013-11-10 DIAGNOSIS — E785 Hyperlipidemia, unspecified: Secondary | ICD-10-CM

## 2013-11-10 DIAGNOSIS — R7303 Prediabetes: Secondary | ICD-10-CM

## 2013-11-10 DIAGNOSIS — R7309 Other abnormal glucose: Secondary | ICD-10-CM

## 2013-11-10 DIAGNOSIS — Z23 Encounter for immunization: Secondary | ICD-10-CM | POA: Insufficient documentation

## 2013-11-10 DIAGNOSIS — Z0000 Encounter for general adult medical examination without abnormal findings: Secondary | ICD-10-CM | POA: Insufficient documentation

## 2013-11-10 LAB — LIPID PANEL
CHOLESTEROL: 190 mg/dL (ref 0–200)
HDL: 51 mg/dL (ref >39)
LDL CALC: 127 mg/dL — AB (ref 0–99)
TRIGLYCERIDES: 60 mg/dL (ref <150)
Total CHOL/HDL Ratio: 3.7 Ratio
VLDL: 12 mg/dL (ref 0–40)

## 2013-11-10 MED ORDER — METHYLPREDNISOLONE ACETATE 80 MG/ML IJ SUSP
80.0000 mg | Freq: Once | INTRAMUSCULAR | Status: AC
  Administered 2013-11-10: 80 mg via INTRAMUSCULAR

## 2013-11-10 MED ORDER — PREDNISONE (PAK) 5 MG PO TABS: 5.0000 mg | ORAL_TABLET | ORAL | Status: DC

## 2013-11-10 NOTE — Patient Instructions (Addendum)
F/u in 4.5 month,,. March 16 or after, call if you need me before  Flu vaccine today  Depo medrol for breathing and 6 day course of prednisone  Blood pressure, liver and kidney are good  Watch your sweets, as sugar is a bit high, likely from the prednisone also  Fasting lipid, cmp , hBA1C and TSH March 12 or after

## 2013-11-10 NOTE — Assessment & Plan Note (Signed)
Depo medrol in office followed by prednisone dose pack

## 2013-11-10 NOTE — Progress Notes (Signed)
   Subjective:    Patient ID: [name], male    DOB: 1950/09/05, 63 y.o.   [name]: [id number]  HPI Patient is in for annual physical exam. C/o 1 week h/o flare in shortness of breath and wheezing, denies fever, chills or sputum production. Needs flu vaccine . Recent labs reviewed and no changes in medication are needed  Review of Systems See HPI     Objective:   Physical Exam BP 130/82 mmHg  Pulse 96  Resp 16  Ht 5\' 7"  (1.702 m)  Wt 143 lb 1.9 oz (64.919 kg)  BMI 22.41 kg/m2  SpO2 96%  Pleasant well nourished male, alert and oriented x 3, in no cardio-pulmonary distress. Afebrile. HEENT No facial trauma or asymetry. Sinuses non tender. EOMI, PERTL, fundoscopic exam is negative for hemorhages or exudates. External ears normal, tympanic membranes clear. Oropharynx moist, no exudate, edentulous Neck: supple, no adenopathy,JVD or thyromegaly.No bruits.  Chest: Decreased air entry throughout, no crackles, scattered high pitched wheezes Non tender to palpation  Breast: No asymetry,no masses. No nipple discharge or inversion. No axillary or supraclavicular adenopathy  Cardiovascular system; Heart sounds normal,  S1 and  S2 ,no S3.  No murmur, or thrill. Apical beat not displaced Peripheral pulses normal.  Abdomen: Soft, non tender, no organomegaly or masses. No bruits. Bowel sounds normal. No guarding, tenderness or rebound.  Rectal:  Normal sphincter tone. No hemorrhoids or  masses. guaiac negative stool. Prostate smooth and firm  .  Musculoskeletal exam: Full ROM of spine, hips , shoulders and knees. No deformity ,swelling or crepitus noted. No muscle wasting or atrophy.   Neurologic: Cranial nerves 2 to 12 intact. Power, tone ,sensation and reflexes normal throughout. No disturbance in gait. No tremor.  Skin: Intact, no ulceration, erythema , scaling or rash noted. Pigmentation normal throughout  Psych; Normal mood and affect. Judgement and  concentration normal         Assessment & Plan:  CHRONIC OBSTRUCTIVE PULMONARY DISEASE, ACUTE EXACERBATION Depo medrol in office followed by prednisone dose pack  Annual physical exam Annual exam as documented. Counseling done  re healthy lifestyle involving commitment to daily physical activity, heart healthy diet, and attaining healthy weight.The importance of adequate sleep also discussed. Home safety, is also discussed. Changes in health habits are decided on by the patient with goals and time frames  set for achieving them. Immunization and cancer screening needs are specifically addressed at this visit.   Need for prophylactic vaccination and inoculation against influenza Vaccine administered at visit.

## 2013-11-11 NOTE — Assessment & Plan Note (Signed)
Vaccine administered at visit.  

## 2013-11-11 NOTE — Assessment & Plan Note (Signed)
Annual exam as documented. Counseling done  re healthy lifestyle involving commitment to daily physical activity, heart healthy diet, and attaining healthy weight.The importance of adequate sleep also discussed. Home safety, is also discussed. Changes in health habits are decided on by the patient with goals and time frames  set for achieving them. Immunization and cancer screening needs are specifically addressed at this visit.

## 2013-12-07 ENCOUNTER — Other Ambulatory Visit: Payer: Self-pay | Admitting: Family Medicine

## 2014-01-26 ENCOUNTER — Other Ambulatory Visit: Payer: Self-pay | Admitting: Family Medicine

## 2014-02-04 ENCOUNTER — Other Ambulatory Visit: Payer: Self-pay | Admitting: Family Medicine

## 2014-02-26 ENCOUNTER — Other Ambulatory Visit: Payer: Self-pay | Admitting: Family Medicine

## 2014-03-07 ENCOUNTER — Other Ambulatory Visit: Payer: Self-pay | Admitting: Family Medicine

## 2014-03-22 ENCOUNTER — Other Ambulatory Visit: Payer: Self-pay | Admitting: Family Medicine

## 2014-03-23 LAB — LIPID PANEL
Cholesterol: 186 mg/dL (ref 0–200)
HDL: 77 mg/dL (ref >=40)
LDL CALC: 100 mg/dL — AB (ref 0–99)
Total CHOL/HDL Ratio: 2.4 Ratio
Triglycerides: 47 mg/dL (ref <150)
VLDL: 9 mg/dL (ref 0–40)

## 2014-03-23 LAB — COMPREHENSIVE METABOLIC PANEL
ALK PHOS: 52 U/L (ref 39–117)
ALT: 13 U/L (ref 0–53)
AST: 16 U/L (ref 0–37)
Albumin: 4.1 g/dL (ref 3.5–5.2)
BUN: 13 mg/dL (ref 6–23)
CHLORIDE: 99 meq/L (ref 96–112)
CO2: 30 mEq/L (ref 19–32)
Calcium: 9.6 mg/dL (ref 8.4–10.5)
Creat: 1.05 mg/dL (ref 0.50–1.35)
Glucose, Bld: 103 mg/dL — ABNORMAL HIGH (ref 70–99)
Potassium: 3.8 mEq/L (ref 3.5–5.3)
SODIUM: 139 meq/L (ref 135–145)
TOTAL PROTEIN: 6.6 g/dL (ref 6.0–8.3)
Total Bilirubin: 0.5 mg/dL (ref 0.2–1.2)

## 2014-03-23 LAB — HEMOGLOBIN A1C
Hgb A1c MFr Bld: 6.4 % — ABNORMAL HIGH (ref <5.7)
Mean Plasma Glucose: 137 mg/dL — ABNORMAL HIGH (ref <117)

## 2014-03-23 LAB — TSH: TSH: 5.157 u[IU]/mL — ABNORMAL HIGH (ref 0.350–4.500)

## 2014-03-28 ENCOUNTER — Ambulatory Visit (INDEPENDENT_AMBULATORY_CARE_PROVIDER_SITE_OTHER): Payer: PPO | Admitting: Family Medicine

## 2014-03-28 ENCOUNTER — Encounter: Payer: Self-pay | Admitting: Family Medicine

## 2014-03-28 VITALS — BP 134/80 | HR 95 | Resp 16 | Ht 67.0 in | Wt 146.1 lb

## 2014-03-28 DIAGNOSIS — I10 Essential (primary) hypertension: Secondary | ICD-10-CM

## 2014-03-28 DIAGNOSIS — R972 Elevated prostate specific antigen [PSA]: Secondary | ICD-10-CM

## 2014-03-28 DIAGNOSIS — Z23 Encounter for immunization: Secondary | ICD-10-CM | POA: Diagnosis not present

## 2014-03-28 DIAGNOSIS — E785 Hyperlipidemia, unspecified: Secondary | ICD-10-CM

## 2014-03-28 DIAGNOSIS — R7989 Other specified abnormal findings of blood chemistry: Secondary | ICD-10-CM | POA: Diagnosis not present

## 2014-03-28 DIAGNOSIS — J449 Chronic obstructive pulmonary disease, unspecified: Secondary | ICD-10-CM

## 2014-03-28 DIAGNOSIS — R7302 Impaired glucose tolerance (oral): Secondary | ICD-10-CM

## 2014-03-28 LAB — HIV ANTIBODY (ROUTINE TESTING W REFLEX): HIV: NONREACTIVE

## 2014-03-28 LAB — T3, FREE: T3, Free: 3 pg/mL (ref 2.3–4.2)

## 2014-03-28 LAB — T4, FREE: Free T4: 1.35 ng/dL (ref 0.80–1.80)

## 2014-03-28 NOTE — Assessment & Plan Note (Addendum)
  Elevated LDL, low fat diet discussed Hyperlipidemia:Low fat diet discussed and encouraged.  CMP Latest Ref Rng 03/22/2014 11/09/2013 03/09/2013  Glucose 70 - 99 mg/dL 103(H) 104(H) 107(H)  BUN 6 - 23 mg/dL 13 12 16   Creatinine 0.50 - 1.35 mg/dL 1.05 0.90 1.08  Sodium 135 - 145 mEq/L 139 140 139  Potassium 3.5 - 5.3 mEq/L 3.8 4.0 3.9  Chloride 96 - 112 mEq/L 99 101 99  CO2 19 - 32 mEq/L 30 29 32  Calcium 8.4 - 10.5 mg/dL 9.6 9.3 9.6  Total Protein 6.0 - 8.3 g/dL 6.6 6.2 6.6  Total Bilirubin 0.2 - 1.2 mg/dL 0.5 0.3 0.5  Alkaline Phos 39 - 117 U/L 52 98 47  AST 0 - 37 U/L 16 13 19   ALT 0 - 53 U/L 13 18 17     Lipid Panel     Component Value Date/Time   CHOL 186 03/22/2014 1006   TRIG 47 03/22/2014 1006   HDL 77 03/22/2014 1006   CHOLHDL 2.4 03/22/2014 1006   VLDL 9 03/22/2014 1006   LDLCALC 100* 03/22/2014 1006

## 2014-03-28 NOTE — Assessment & Plan Note (Signed)
Controlled, no change in medication DASH diet and commitment to daily physical activity for a minimum of 30 minutes discussed and encouraged, as a part of hypertension management. The importance of attaining a healthy weight is also discussed.  BP/Weight 03/28/2014 11/10/2013 06/16/2013 05/28/2013 03/17/2013 11/11/2012 3/41/9622  Systolic BP 297 989 211 941 740 814 481  Diastolic BP 80 82 80 86 80 82 80  Wt. (Lbs) 146.12 143.12 138.8 134.08 134 146.4 146  BMI 22.88 22.41 21.73 20.99 20.98 22.92 22.86    CMP Latest Ref Rng 03/22/2014 11/09/2013 03/09/2013  Glucose 70 - 99 mg/dL 103(H) 104(H) 107(H)  BUN 6 - 23 mg/dL 13 12 16   Creatinine 0.50 - 1.35 mg/dL 1.05 0.90 1.08  Sodium 135 - 145 mEq/L 139 140 139  Potassium 3.5 - 5.3 mEq/L 3.8 4.0 3.9  Chloride 96 - 112 mEq/L 99 101 99  CO2 19 - 32 mEq/L 30 29 32  Calcium 8.4 - 10.5 mg/dL 9.6 9.3 9.6  Total Protein 6.0 - 8.3 g/dL 6.6 6.2 6.6  Total Bilirubin 0.2 - 1.2 mg/dL 0.5 0.3 0.5  Alkaline Phos 39 - 117 U/L 52 98 47  AST 0 - 37 U/L 16 13 19   ALT 0 - 53 U/L 13 18 17

## 2014-03-28 NOTE — Progress Notes (Signed)
[name]     [name]: [id number]      DOB: December 10, 1950   HPI [name] is here for follow up and re-evaluation of chronic medical conditions, medication management and review of any available recent lab and radiology data.  Preventive health is updated, specifically  Cancer screening and Immunization.   Questions or concerns regarding consultations or procedures which the PT has had in the interim are  addressed. The PT denies any adverse reactions to current medications since the last visit.  There are no new concerns.  There are no specific complaints   ROS Denies recent fever or chills. Denies sinus pressure, nasal congestion, ear pain or sore throat. Denies chest congestion, productive cough or wheezing. Denies chest pains, palpitations and leg swelling Denies abdominal pain, nausea, vomiting,diarrhea or constipation.   Denies dysuria, frequency, hesitancy or incontinence. Denies joint pain, swelling and limitation in mobility. Denies headaches, seizures, numbness, or tingling. Denies depression, anxiety or insomnia. Denies skin break down or rash.   PE  BP 134/80 mmHg  Pulse 95  Resp 16  Ht 5\' 7"  (1.702 m)  Wt 146 lb 1.9 oz (66.28 kg)  BMI 22.88 kg/m2  SpO2 96%  Patient alert and oriented and in no cardiopulmonary distress.  HEENT: No facial asymmetry, EOMI,   oropharynx pink and moist.  Neck supple no JVD, no mass.  Chest: Clear to auscultation bilaterally.  CVS: S1, S2 no murmurs, no S3.Regular rate.  ABD: Soft non tender.   Ext: No edema  MS: Adequate ROM spine, shoulders, hips and knees.  Skin: Intact, no ulcerations or rash noted.  Psych: Good eye contact, normal affect. Memory intact not anxious or depressed appearing.  CNS: CN 2-12 intact, power,  normal throughout.no focal deficits noted.   Assessment & Plan   Essential hypertension Controlled, no change in medication DASH diet and commitment to daily physical activity for a minimum of 30  minutes discussed and encouraged, as a part of hypertension management. The importance of attaining a healthy weight is also discussed.  BP/Weight 03/28/2014 11/10/2013 06/16/2013 05/28/2013 03/17/2013 11/11/2012 05/24/6158  Systolic BP 737 106 269 485 462 703 500  Diastolic BP 80 82 80 86 80 82 80  Wt. (Lbs) 146.12 143.12 138.8 134.08 134 146.4 146  BMI 22.88 22.41 21.73 20.99 20.98 22.92 22.86    CMP Latest Ref Rng 03/22/2014 11/09/2013 03/09/2013  Glucose 70 - 99 mg/dL 103(H) 104(H) 107(H)  BUN 6 - 23 mg/dL 13 12 16   Creatinine 0.50 - 1.35 mg/dL 1.05 0.90 1.08  Sodium 135 - 145 mEq/L 139 140 139  Potassium 3.5 - 5.3 mEq/L 3.8 4.0 3.9  Chloride 96 - 112 mEq/L 99 101 99  CO2 19 - 32 mEq/L 30 29 32  Calcium 8.4 - 10.5 mg/dL 9.6 9.3 9.6  Total Protein 6.0 - 8.3 g/dL 6.6 6.2 6.6  Total Bilirubin 0.2 - 1.2 mg/dL 0.5 0.3 0.5  Alkaline Phos 39 - 117 U/L 52 98 47  AST 0 - 37 U/L 16 13 19   ALT 0 - 53 U/L 13 18 17         Hyperlipidemia  Elevated LDL, low fat diet discussed Hyperlipidemia:Low fat diet discussed and encouraged.  CMP Latest Ref Rng 03/22/2014 11/09/2013 03/09/2013  Glucose 70 - 99 mg/dL 103(H) 104(H) 107(H)  BUN 6 - 23 mg/dL 13 12 16   Creatinine 0.50 - 1.35 mg/dL 1.05 0.90 1.08  Sodium 135 - 145 mEq/L 139 140 139  Potassium 3.5 - 5.3  mEq/L 3.8 4.0 3.9  Chloride 96 - 112 mEq/L 99 101 99  CO2 19 - 32 mEq/L 30 29 32  Calcium 8.4 - 10.5 mg/dL 9.6 9.3 9.6  Total Protein 6.0 - 8.3 g/dL 6.6 6.2 6.6  Total Bilirubin 0.2 - 1.2 mg/dL 0.5 0.3 0.5  Alkaline Phos 39 - 117 U/L 52 98 47  AST 0 - 37 U/L 16 13 19   ALT 0 - 53 U/L 13 18 17     Lipid Panel     Component Value Date/Time   CHOL 186 03/22/2014 1006   TRIG 47 03/22/2014 1006   HDL 77 03/22/2014 1006   CHOLHDL 2.4 03/22/2014 1006   VLDL 9 03/22/2014 1006   LDLCALC 100* 03/22/2014 1006         Asthma with COPD (chronic obstructive pulmonary disease) Controlled, no change in medication Followed by pulmonary    IGT  (impaired glucose tolerance) Patient educated about the importance of limiting  Carbohydrate intake , the need to commit to daily physical activity for a minimum of 30 minutes , and to commit weight loss. The fact that changes in all these areas will reduce or eliminate all together the development of diabetes is stressed.      PSA, INCREASED Updated lab obtained and referred to new urologist as prior one has retired. No definitive treatment up to this time is what I gather from last note, observed on avg every 6 monht   Need for vaccination with 13-polyvalent pneumococcal conjugate vaccine After obtaining informed consent, the vaccine is  administered by LPN.    Abnormal TSH [place] of thyroid to further eval anatomy of the gland

## 2014-03-28 NOTE — Patient Instructions (Signed)
Annual wellness in 3 month, call if you need me before  You are referred to a new urologist as Dr Michela Pitcher has retired  You need to cut back on sweets and sugar as you may become diabetic if you do not  Commit to walking daily for 5 to 10 minutes each time as able  Prevnar today  Labs are being added to current, HIV, free T3 and freeT 4 and PSA  You are referred for [place] of thyroid gland

## 2014-03-29 LAB — PSA, MEDICARE: PSA: 8.75 ng/mL — ABNORMAL HIGH (ref <=4.00)

## 2014-04-01 ENCOUNTER — Ambulatory Visit (HOSPITAL_COMMUNITY)
Admission: RE | Admit: 2014-04-01 | Discharge: 2014-04-01 | Disposition: A | Discharge status: Home / Self Care | Payer: PPO | Source: Ambulatory Visit | Attending: Family Medicine | Admitting: Family Medicine

## 2014-04-01 DIAGNOSIS — R7989 Other specified abnormal findings of blood chemistry: Secondary | ICD-10-CM | POA: Diagnosis not present

## 2014-04-07 ENCOUNTER — Telehealth: Payer: Self-pay | Admitting: Family Medicine

## 2014-04-07 ENCOUNTER — Other Ambulatory Visit: Payer: Self-pay | Admitting: Family Medicine

## 2014-04-07 DIAGNOSIS — R972 Elevated prostate specific antigen [PSA]: Secondary | ICD-10-CM

## 2014-04-07 NOTE — Telephone Encounter (Signed)
Pls cal pt let him know that since Dr Michela Pitcher has retired, I am referring him to [facility] here in this building about his prostate. I have entered the referral, No specific Provider is named , so soonest available is fine Pls send Dr Delton Coombes office note from 08/2013 , the last time Pt was seen by him along with the referral, that is all the info and most update dinfo I have  ?? pls ask

## 2014-04-10 DIAGNOSIS — R7989 Other specified abnormal findings of blood chemistry: Secondary | ICD-10-CM | POA: Insufficient documentation

## 2014-04-10 DIAGNOSIS — Z23 Encounter for immunization: Secondary | ICD-10-CM | POA: Insufficient documentation

## 2014-04-10 NOTE — Assessment & Plan Note (Signed)
Patient educated about the importance of limiting  Carbohydrate intake , the need to commit to daily physical activity for a minimum of 30 minutes , and to commit weight loss. The fact that changes in all these areas will reduce or eliminate all together the development of diabetes is stressed.    

## 2014-04-10 NOTE — Assessment & Plan Note (Signed)
Controlled, no change in medication Followed by pulmonary

## 2014-04-10 NOTE — Assessment & Plan Note (Signed)
[place] of thyroid to further eval anatomy of the gland

## 2014-04-10 NOTE — Assessment & Plan Note (Signed)
Updated lab obtained and referred to new urologist as prior one has retired. No definitive treatment up to this time is what I gather from last note, observed on avg every 6 monht

## 2014-04-10 NOTE — Assessment & Plan Note (Signed)
After obtaining informed consent, the vaccine is  administered by LPN.  

## 2014-05-10 ENCOUNTER — Other Ambulatory Visit: Payer: Self-pay | Admitting: Family Medicine

## 2014-06-09 ENCOUNTER — Other Ambulatory Visit: Payer: Self-pay | Admitting: Family Medicine

## 2014-06-24 ENCOUNTER — Ambulatory Visit (INDEPENDENT_AMBULATORY_CARE_PROVIDER_SITE_OTHER): Payer: PPO | Admitting: Urology

## 2014-06-24 DIAGNOSIS — N400 Enlarged prostate without lower urinary tract symptoms: Secondary | ICD-10-CM

## 2014-06-24 DIAGNOSIS — R972 Elevated prostate specific antigen [PSA]: Secondary | ICD-10-CM

## 2014-06-27 ENCOUNTER — Other Ambulatory Visit: Payer: Self-pay | Admitting: Family Medicine

## 2014-07-09 ENCOUNTER — Other Ambulatory Visit: Payer: Self-pay | Admitting: Family Medicine

## 2014-08-04 ENCOUNTER — Ambulatory Visit (INDEPENDENT_AMBULATORY_CARE_PROVIDER_SITE_OTHER): Payer: PPO | Admitting: Family Medicine

## 2014-08-04 ENCOUNTER — Encounter: Payer: Self-pay | Admitting: Family Medicine

## 2014-08-04 VITALS — BP 124/80 | HR 91 | Resp 16 | Ht 66.0 in | Wt 135.8 lb

## 2014-08-04 DIAGNOSIS — R7302 Impaired glucose tolerance (oral): Secondary | ICD-10-CM | POA: Diagnosis not present

## 2014-08-04 DIAGNOSIS — R972 Elevated prostate specific antigen [PSA]: Secondary | ICD-10-CM

## 2014-08-04 DIAGNOSIS — F17219 Nicotine dependence, cigarettes, with unspecified nicotine-induced disorders: Secondary | ICD-10-CM

## 2014-08-04 DIAGNOSIS — Z0000 Encounter for general adult medical examination without abnormal findings: Secondary | ICD-10-CM | POA: Diagnosis not present

## 2014-08-04 DIAGNOSIS — F17218 Nicotine dependence, cigarettes, with other nicotine-induced disorders: Secondary | ICD-10-CM

## 2014-08-04 DIAGNOSIS — J449 Chronic obstructive pulmonary disease, unspecified: Secondary | ICD-10-CM

## 2014-08-04 DIAGNOSIS — Z604 Social exclusion and rejection: Secondary | ICD-10-CM

## 2014-08-04 DIAGNOSIS — D509 Iron deficiency anemia, unspecified: Secondary | ICD-10-CM

## 2014-08-04 DIAGNOSIS — I10 Essential (primary) hypertension: Secondary | ICD-10-CM | POA: Diagnosis not present

## 2014-08-04 DIAGNOSIS — F17200 Nicotine dependence, unspecified, uncomplicated: Secondary | ICD-10-CM | POA: Insufficient documentation

## 2014-08-04 DIAGNOSIS — E785 Hyperlipidemia, unspecified: Secondary | ICD-10-CM

## 2014-08-04 NOTE — Patient Instructions (Signed)
F/u with rectal in December  Call and come for flu vaccine in september  We will be speaking with the social  Worker to try to get you involved in the community, since you remain  Socially isolated  Thanks for choosing [facility], we consider it a privelige to serve you.

## 2014-08-04 NOTE — Assessment & Plan Note (Signed)

## 2014-08-04 NOTE — Progress Notes (Signed)
Subjective:    Patient ID: [name], male    DOB: 12/07/1950, 64 y.o.   [name]: [id number]  HPI  Preventive Screening-Counseling & Management   Patient present here today for a Medicare annual wellness visit.   Current Problems (verified)   Medications Prior to Visit Allergies (verified)   PAST HISTORY  Family History (verified)- Patient unable to give health history of family members   Social History  Widowed x 18 years, 3 children 1 girl and 2 boys. Worked for [employer]. Still a smoker some days.    Risk Factors  Current exercise habits:  No current exercise routine  Dietary issues discussed: Sometimes he cooks at home, lives alone. Will try to commit to eating more fruits and vegetables and less fried foods   Cardiac risk factors: Nicotine use   Depression Screen  (Note: if answer to either of the following is "Yes", a more complete depression screening is indicated)   Over the past two weeks, have you felt down, depressed or hopeless? No  Over the past two weeks, have you felt little interest or pleasure in doing things? No  Have you lost interest or pleasure in daily life? No  Do you often feel hopeless? No  Do you cry easily over simple problems? No   Activities of Daily Living  In your present state of health, do you have any difficulty performing the following activities?  Driving?: Doesn't have a license, uses Moped for transport  Managing money?: No. Can't get anybody to help him out  Feeding yourself?:No Getting from bed to chair?:No Climbing a flight of stairs?:No Preparing food and eating?:No Bathing or showering?:No Getting dressed?:No Getting to the toilet?:No Using the toilet?:No Moving around from place to place?: No  Fall Risk Assessment In the past year have you fallen or had a near fall?:No Are you currently taking any medications that make you dizzy?:No   Hearing Difficulties: No Do you often ask people to speak up or repeat  themselves?:No Do you experience ringing or noises in your ears?:No Do you have difficulty understanding soft or whispered voices?:No  Cognitive Testing  Alert? Yes Normal Appearance?Yes  Oriented to person? Yes Place? Yes  Time? Yes  Displays appropriate judgment?Yes  Can read the correct time from a watch face? yes Are you having problems remembering things?No  Advanced Directives have been discussed with the patient? Will give brochure , full code    List the Names of Other Physician/Practitioners you currently use:  Luan Pulling - pulmonary  Jeffie Pollock - urology   Indicate any recent Medical Services you may have received from other than [facility] providers in the past year (date may be approximate).   Assessment:    Annual Wellness Exam   Plan:    .  Medicare Attestation  I have personally reviewed:  The patient's medical and social history  Their use of alcohol, tobacco or illicit drugs  Their current medications and supplements  The patient's functional ability including ADLs,fall risks, home safety risks, cognitive, and hearing and visual impairment  Diet and physical activities  Evidence for depression or mood disorders  The patient's weight, height, BMI, and visual acuity have been recorded in the chart. I have made referrals, counseling, and provided education to the patient based on review of the above and I have provided the patient with a written personalized care plan for preventive services.     Review of Systems     Objective:   Physical Exam BP  124/80 mmHg  Pulse 91  Resp 16  Ht 5\' 6"  (1.676 m)  Wt 135 lb 12.8 oz (61.598 kg)  BMI 21.93 kg/m2  SpO2 96%        Assessment & Plan:

## 2014-08-04 NOTE — Assessment & Plan Note (Signed)

## 2014-08-06 DIAGNOSIS — F17219 Nicotine dependence, cigarettes, with unspecified nicotine-induced disorders: Secondary | ICD-10-CM | POA: Insufficient documentation

## 2014-08-06 NOTE — Assessment & Plan Note (Signed)
Well controlled on current regime, also sees pulmonary

## 2014-08-06 NOTE — Assessment & Plan Note (Signed)

## 2014-08-06 NOTE — Assessment & Plan Note (Signed)
Ongoing problem, will try to involve social worker to provide assistance in changing this

## 2014-08-06 NOTE — Progress Notes (Signed)
   Subjective:    Patient ID: [name], male    DOB: 1950/04/08, 64 y.o.   [name]: [id number]  HPI    Review of Systems     Objective:   Physical Exam        Assessment & Plan:  Medicare annual wellness visit, subsequent Annual exam as documented. Counseling done  re healthy lifestyle involving commitment to 150 minutes exercise per week, heart healthy diet, and attaining healthy weight.The importance of adequate sleep also discussed. Regular seat belt use and home safety, is also discussed. Changes in health habits are decided on by the patient with goals and time frames  set for achieving them. Immunization and cancer screening needs are specifically addressed at this visit.   Nicotine dependence Patient counseled for approximately 5 minutes regarding the health risks of ongoing nicotine use, specifically all types of cancer, heart disease, stroke and respiratory failure. The options available for help with cessation ,the behavioral changes to assist the process, and the option to either gradully reduce usage  Or abruptly stop.is also discussed. Pt is also encouraged to set specific goals in number of cigarettes used daily, as well as to set a quit date.  Number of cigarettes/cigars currently smoking daily: 3   Social isolation Ongoing problem, will try to involve social worker to provide assistance in changing this  PSA, INCREASED No diagnosis of cancer, followed by urology  Asthma with COPD (chronic obstructive pulmonary disease) Well controlled on current regime, also sees pulmonary  Cigarette nicotine dependence with nicotine-induced disorder Patient counseled for approximately 5 minutes regarding the health risks of ongoing nicotine use, specifically all types of cancer, heart disease, stroke and respiratory failure. The options available for help with cessation ,the behavioral changes to assist the process, and the option to either gradully reduce usage  Or  abruptly stop.is also discussed. Pt is also encouraged to set specific goals in number of cigarettes used daily, as well as to set a quit date.  Number of cigarettes/cigars currently smoking daily: 3

## 2014-08-06 NOTE — Assessment & Plan Note (Signed)
No diagnosis of cancer, followed by urology

## 2014-08-08 ENCOUNTER — Other Ambulatory Visit: Payer: Self-pay | Admitting: Licensed Clinical Social Worker

## 2014-08-08 NOTE — Patient Outreach (Signed)
Assessment:  CSW received referral on [name].CSW completed a chart review on client on 08/08/14. CSW called client home phone number on 08/08/14 but was not able to speak via phone with client.  CSW left phone message for client requesting client return call to CSW at [phone] to discuss current needs of client.  CSW later spoke on 08/08/14 via phone with [name]. [name]. CSW verified identity of client.  CSW introduced self to [name] and informed client that Dr. Moshe [name], client's primary doctor, had made referral to [name] CSW for CSW to contact client to discuss current needs of client. [name] [name] gave verbal permission on 08/08/14 to CSW for CSW to talk with [name]. [name] about medical needs and general social work needs of client. Client said he lived alone and had no real family support or contact. He said he has a son but son has not given [name] the address where his son resides  Client uses a moped to travel to and from his appointments, to go to eat, to buy groceries and complete needed errands.  CSW and client completed needed [facility] assessments for client via phone on 08/08/14.  CSW spoke with client about completion of [facility] consent form.  Client agreed to review consent form and agreed to complete [facility] consent form. Client said he uses oxygen as needed at his home.  He can cook needed meals, takes his prescribed medications, is able to pay his monthly bills, but said he sometimes needs help managing/cleaning his home. He tries to mow his yard; but due to COPD he has limited ability to exert himself and gets fatigued easily.  He said he tries to mow his yard a little at a time.  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 He said he is taking his medications as prescribed and said that he had routine appointment on 08/04/14 with Dr [name]. CSW asked client if CSW could conduct a routine home visit with client on 08/09/14 at 1:30 PM.  Client agreed for CSW to conduct routine home  visit with client on 08/09/14 at 1:30 PM. CSW thanked client for phone conversation on 08/08/14.   Plan:  Client to take medications as prescribed and attend scheduled medical appointments Client to communicate with Dr. Moshe [name], as needed, to discuss medical needs of client. CSW to discuss with client [facility] consent form completion for client during [place] routine home visit with client on 08/08/14.  Norva Riffle.Amy Gothard MSW, LCSW Licensed Clinical Social Worker [facility] Care Management [phone]

## 2014-08-08 NOTE — Patient Outreach (Signed)
[place] [facility]) Care Management  08/08/2014  [name] Oct 24, 1950 [id number]   Referral from MD office for SW, assigned [name], LCSW to outreach patient.  [name]. [place], [place] Management [place] Assistant Phone: [phone] Fax: [phone]

## 2014-08-09 ENCOUNTER — Other Ambulatory Visit: Payer: Self-pay | Admitting: Family Medicine

## 2014-08-09 ENCOUNTER — Encounter: Payer: Self-pay | Admitting: Licensed Clinical Social Worker

## 2014-08-09 ENCOUNTER — Other Ambulatory Visit: Payer: Self-pay | Admitting: Licensed Clinical Social Worker

## 2014-08-09 NOTE — Patient Outreach (Signed)
[place] [facility]) Care Management  [facility] Social Work  08/09/2014  [name] [birth year] [id number]  Subjective:    Objective:   Current Medications:  Current Outpatient Prescriptions  Medication Sig Dispense Refill  . ADVAIR DISKUS 500-50 MCG/DOSE AEPB INHALE 1 PUFF BY MOUTH TWICE A DAY. RINSE MOUTH AFTER USE. 60 each 3  . albuterol (PROVENTIL) (2.5 MG/3ML) 0.083% nebulizer solution TAKE 1 AMPULE (3 MLS) BY NEBULIZATION AS DIRECTED EVERY 4 HOURS AS NEEDED. 375 mL 3  . aspirin 81 MG tablet Take 81 mg by mouth daily.    [name] Kitchen ipratropium (ATROVENT) 0.02 % nebulizer solution INHALE 1 VIAL VIA NEBULIZER EVERY 6-8 HOURS FOR WHEEZING. 312.5 mL 2  . Multiple Vitamin (MULTIVITAMIN) tablet Take 1 tablet by mouth daily.    . pravastatin (PRAVACHOL) 40 MG tablet TAKE (1) TABLET BY MOUTH AT BEDTIME. 30 tablet 2  . triamterene-hydrochlorothiazide (MAXZIDE-25) 37.5-25 MG per tablet TAKE ONE TABLET BY MOUTH ONCE DAILY. 30 tablet 4  . VENTOLIN HFA 108 (90 BASE) MCG/ACT inhaler INHALE 2 PUFFS EVERY 4 HOURS AS NEEDED. 18 g 3  . [DISCONTINUED] simvastatin (ZOCOR) 40 MG tablet Take 40 mg by mouth daily.     No current facility-administered medications for this visit.    Functional Status:  In your present state of health, do you have any difficulty performing the following activities: 08/08/2014 08/04/2014  Hearing? - N  Vision? - N  Difficulty concentrating or making decisions? Y N  Walking or climbing stairs? - N  Dressing or bathing? - N  Doing errands, shopping? - N  Conservation officer, nature and eating ? N -  Using the Toilet? N -  In the past six months, have you accidently leaked urine? N -  Do you have problems with loss of bowel control? N -  Managing your Medications? N -  Managing your Finances? N -  Housekeeping or managing your Housekeeping? Y -    Fall/Depression Screening:  PHQ 2/9 Scores 08/08/2014 06/16/2013 07/23/2012 07/23/2012  PHQ - 2 Score 2 6 0 0  PHQ- 9 Score 5 12 - -     Assessment:  CSW met with client at home of client on 08/09/14.  CSW reviewed with [name] program supports of [facility] regarding pharmacy support, nursing support and clinical social work support. Client lives alone in home in [place], [place]. He uses moped for transport assistance.  Client said he had been gong to scheduled appointments with Dr. Moshe [name].  Dr. Moshe [name] had [name] regarding depression issues for client and regarding client's social isolation often from others. That is, client has reduced social involvement.  CSW and client spoke of social involvement of client. He said he had reduced family contact and had few friends at present.  He said he had more friends when he worked during the days.  CSW encouraged client to involve himself in social activities each month to try to develop some new friendships and also find enjoyable social activities in which client could participate.  CSW informed client of new [employer] under construction nearby. Hopefully this [place] will be completed soon. CSW encouraged client to consider going to activities/social events at the [facility].  He could ride his moped to [facility] since it is very close to where he currently lives.  CSW also encourged client to involve himself in social activities in [place], [place] such as produce sales, local car shows, periodic social activities in  downtown [place], [place] area. CSW  reminded client that local [place] has a free calendar of upcoming community events. CSW and client spoke of [facility] consent form.  CSW and client reviewed [facility] consent form. CSW and client completed [facility] consent form for client on 08/09/14. CSW gave client yellow copy of completed [facility] consent form.  Client said he sometimes get short of breath when out in the community. He does not currently have a portable oxygen canister. He also does not have a oxygen concentrator in the home. He said he uses nebulizer 3 times  daily as prescribed.He said he has two inhalers he uses to help with breathing. He said he has [employer].  He has his prescribed medications.  He has been sleeping well. He said his weight had been stable.  He said he sometimes gets sad due to decreased contact with family.  CSW and client have completed PHQ2 and PHQ9 for client.   His daughter resides in [place]. He said he was not sure of address of his son.  He said he had no living siblings at present.  Client spoke of back pain (lower back) on left side.  He said pain had started on 08/09/14.  He said if he stays outside very long he gets short of breath.  He uses inhalers as needed.  CSW gave [name] [facility] CSW card with [facility] number ([phone]) on card.  CSW encouraged [name] to call CSW as needed to address social work needs of client.    Plan:  Client will take his medications as prescribed and will see Dr. Moshe [name] as scheduled. Client to also attend appointments scheduled with Dr. Gayla [name] and Dr. Jeffie [name].. CSW to call client in two weeks to assess needs of client.  [name].[name] MSW, LCSW Licensed Clinical Social Worker [facility] Care Management [phone]

## 2014-08-24 LAB — LIPID PANEL
CHOL/HDL RATIO: 2.4 ratio (ref <=5.0)
Cholesterol: 156 mg/dL (ref 125–200)
HDL: 65 mg/dL (ref >=40)
LDL Cholesterol: 79 mg/dL (ref <130)
Triglycerides: 61 mg/dL (ref <150)
VLDL: 12 mg/dL (ref <30)

## 2014-08-24 LAB — COMPLETE METABOLIC PANEL WITH GFR
ALBUMIN: 3.9 g/dL (ref 3.6–5.1)
ALK PHOS: 42 U/L (ref 40–115)
ALT: 14 U/L (ref 9–46)
AST: 18 U/L (ref 10–35)
BUN: 10 mg/dL (ref 7–25)
CO2: 32 mmol/L — ABNORMAL HIGH (ref 20–31)
Calcium: 9.3 mg/dL (ref 8.6–10.3)
Chloride: 100 mmol/L (ref 98–110)
Creat: 0.97 mg/dL (ref 0.70–1.25)
GFR, Est African American: >89 mL/min (ref >=60)
GFR, Est Non African American: 81 mL/min (ref >=60)
Glucose, Bld: 109 mg/dL — ABNORMAL HIGH (ref 65–99)
Potassium: 3.7 mmol/L (ref 3.5–5.3)
Sodium: 142 mmol/L (ref 135–146)
Total Bilirubin: 0.6 mg/dL (ref 0.2–1.2)
Total Protein: 6.2 g/dL (ref 6.1–8.1)

## 2014-08-24 LAB — HEMOGLOBIN A1C
Hgb A1c MFr Bld: 6.3 % — ABNORMAL HIGH (ref <5.7)
Mean Plasma Glucose: 134 mg/dL — ABNORMAL HIGH (ref <117)

## 2014-08-24 LAB — TSH: TSH: 3.396 u[IU]/mL (ref 0.350–4.500)

## 2014-08-26 ENCOUNTER — Other Ambulatory Visit: Payer: Self-pay | Admitting: Family Medicine

## 2014-09-16 ENCOUNTER — Ambulatory Visit (INDEPENDENT_AMBULATORY_CARE_PROVIDER_SITE_OTHER): Payer: PPO | Admitting: Urology

## 2014-09-16 DIAGNOSIS — R972 Elevated prostate specific antigen [PSA]: Secondary | ICD-10-CM

## 2014-09-16 DIAGNOSIS — N5201 Erectile dysfunction due to arterial insufficiency: Secondary | ICD-10-CM

## 2014-09-21 ENCOUNTER — Ambulatory Visit: Payer: PPO | Admitting: Family Medicine

## 2014-09-21 ENCOUNTER — Encounter (HOSPITAL_COMMUNITY): Payer: Self-pay | Admitting: Emergency Medicine

## 2014-09-21 ENCOUNTER — Emergency Department (HOSPITAL_COMMUNITY): Payer: PPO

## 2014-09-21 ENCOUNTER — Emergency Department (HOSPITAL_COMMUNITY)
Admission: EM | Admit: 2014-09-21 | Discharge: 2014-09-21 | Disposition: A | Discharge status: Home / Self Care | Payer: PPO | Attending: Emergency Medicine | Admitting: Emergency Medicine

## 2014-09-21 DIAGNOSIS — Z7982 Long term (current) use of aspirin: Secondary | ICD-10-CM | POA: Insufficient documentation

## 2014-09-21 DIAGNOSIS — I10 Essential (primary) hypertension: Secondary | ICD-10-CM | POA: Diagnosis not present

## 2014-09-21 DIAGNOSIS — Z720 Tobacco use: Secondary | ICD-10-CM | POA: Insufficient documentation

## 2014-09-21 DIAGNOSIS — R0602 Shortness of breath: Secondary | ICD-10-CM | POA: Diagnosis present

## 2014-09-21 DIAGNOSIS — Z79899 Other long term (current) drug therapy: Secondary | ICD-10-CM | POA: Insufficient documentation

## 2014-09-21 DIAGNOSIS — Z8659 Personal history of other mental and behavioral disorders: Secondary | ICD-10-CM | POA: Insufficient documentation

## 2014-09-21 DIAGNOSIS — J441 Chronic obstructive pulmonary disease with (acute) exacerbation: Secondary | ICD-10-CM | POA: Insufficient documentation

## 2014-09-21 DIAGNOSIS — Z8673 Personal history of transient ischemic attack (TIA), and cerebral infarction without residual deficits: Secondary | ICD-10-CM | POA: Insufficient documentation

## 2014-09-21 DIAGNOSIS — E785 Hyperlipidemia, unspecified: Secondary | ICD-10-CM | POA: Diagnosis not present

## 2014-09-21 LAB — CBC WITH DIFFERENTIAL/PLATELET
BASOS ABS: 0 10*3/uL (ref 0.0–0.1)
Basophils Relative: 0 %
EOS ABS: 0.2 10*3/uL (ref 0.0–0.7)
EOS PCT: 3 %
HCT: 42.6 % (ref 39.0–52.0)
Hemoglobin: 14 g/dL (ref 13.0–17.0)
LYMPHS PCT: 52 %
Lymphs Abs: 3 10*3/uL (ref 0.7–4.0)
MCH: 30.8 pg (ref 26.0–34.0)
MCHC: 32.9 g/dL (ref 30.0–36.0)
MCV: 93.6 fL (ref 78.0–100.0)
Monocytes Absolute: 0.5 10*3/uL (ref 0.1–1.0)
Monocytes Relative: 8 %
Neutro Abs: 2.2 10*3/uL (ref 1.7–7.7)
Neutrophils Relative %: 37 %
PLATELETS: 211 10*3/uL (ref 150–400)
RBC: 4.55 MIL/uL (ref 4.22–5.81)
RDW: 12.8 % (ref 11.5–15.5)
WBC: 5.8 10*3/uL (ref 4.0–10.5)

## 2014-09-21 LAB — COMPREHENSIVE METABOLIC PANEL
ALBUMIN: 4.1 g/dL (ref 3.5–5.0)
ALT: 16 U/L — ABNORMAL LOW (ref 17–63)
ANION GAP: 7 (ref 5–15)
AST: 21 U/L (ref 15–41)
Alkaline Phosphatase: 49 U/L (ref 38–126)
BUN: 11 mg/dL (ref 6–20)
CHLORIDE: 101 mmol/L (ref 101–111)
CO2: 32 mmol/L (ref 22–32)
Calcium: 9 mg/dL (ref 8.9–10.3)
Creatinine, Ser: 0.98 mg/dL (ref 0.61–1.24)
GFR calc Af Amer: >60 mL/min (ref >60)
GFR calc non Af Amer: >60 mL/min (ref >60)
GLUCOSE: 183 mg/dL — AB (ref 65–99)
Potassium: 3.8 mmol/L (ref 3.5–5.1)
SODIUM: 140 mmol/L (ref 135–145)
Total Bilirubin: 0.8 mg/dL (ref 0.3–1.2)
Total Protein: 6.9 g/dL (ref 6.5–8.1)

## 2014-09-21 MED ORDER — IPRATROPIUM-ALBUTEROL 0.5-2.5 (3) MG/3ML IN SOLN
3.0000 mL | Freq: Once | RESPIRATORY_TRACT | Status: AC
  Administered 2014-09-21: 3 mL via RESPIRATORY_TRACT
  Filled 2014-09-21: qty 3

## 2014-09-21 MED ORDER — PREDNISONE 10 MG PO TABS: 20.0000 mg | ORAL_TABLET | Freq: Every day | ORAL | Status: DC

## 2014-09-21 MED ORDER — IPRATROPIUM BROMIDE 0.02 % IN SOLN
1.0000 mg | Freq: Once | RESPIRATORY_TRACT | Status: AC
  Administered 2014-09-21: 1 mg via RESPIRATORY_TRACT
  Filled 2014-09-21: qty 5

## 2014-09-21 MED ORDER — ALBUTEROL SULFATE (2.5 MG/3ML) 0.083% IN NEBU
2.5000 mg | INHALATION_SOLUTION | Freq: Once | RESPIRATORY_TRACT | Status: AC
  Administered 2014-09-21: 2.5 mg via RESPIRATORY_TRACT
  Filled 2014-09-21: qty 3

## 2014-09-21 MED ORDER — METHYLPREDNISOLONE SODIUM SUCC 125 MG IJ SOLR
125.0000 mg | Freq: Once | INTRAMUSCULAR | Status: AC
  Administered 2014-09-21: 125 mg via INTRAVENOUS
  Filled 2014-09-21: qty 2

## 2014-09-21 MED ORDER — AZITHROMYCIN 250 MG PO TABS: ORAL_TABLET | ORAL | Status: DC

## 2014-09-21 MED ORDER — ALBUTEROL (5 MG/ML) CONTINUOUS INHALATION SOLN
10.0000 mg/h | INHALATION_SOLUTION | RESPIRATORY_TRACT | Status: DC
  Administered 2014-09-21: 10 mg/h via RESPIRATORY_TRACT
  Filled 2014-09-21: qty 20

## 2014-09-21 NOTE — ED Notes (Signed)
Pt c/o sob since last night, worse this am. No relief from home nebulizer and inhaler. Audible wheezing. O2 sat 88% on ra.

## 2014-09-21 NOTE — ED Notes (Signed)
Patient states he is breathing better. Wheezing still noted, but less audible. WOB has decreased.

## 2014-09-21 NOTE — ED Provider Notes (Signed)
CSN: [id number]     Arrival date & time 09/21/14  0076 History  This chart was scribed for Milton Ferguson, MD by Terressa Koyanagi, ED Scribe. This patient was seen in room APA02/APA02 and the patient's care was started at 9:08 AM.   Chief Complaint  Patient presents with  . Shortness of Breath   Patient is a 64 y.o. male presenting with shortness of breath. The history is provided by the patient. No language interpreter was used.  Shortness of Breath Severity:  Severe Onset quality:  Sudden Duration:  8 hours Timing:  Constant Progression:  Worsening Chronicity:  Recurrent Relieved by:  Nothing Ineffective treatments:  Inhaler Associated symptoms: wheezing   Associated symptoms: no abdominal pain, no chest pain, no cough, no headaches and no rash   Risk factors: tobacco use    PCP: [name], MD HPI Comments: [name] is a 63 y.o. male, with PMHx noted below including COPD, asthma, daily tobacco use (0.3 ppd), who presents to the Emergency Department complaining of recurrent, worsening SOB onset around 1AM this morning. Pt reports using a nebulizer and inhaler at home with no relief.   Past Medical History  Diagnosis Date  . Nicotine addiction   . COPD (chronic obstructive pulmonary disease)   . Depression   . Hyperlipidemia   . Hypertension   . CVA (cerebral vascular accident) 2008    with temporary vision loss   . Asthma   . Marijuana abuse   . History of tobacco abuse 01/14/2011  . History of substance abuse 01/14/2011    Marijuana  . Elevated PSA 2014    no diagnosis pf prostate cancer in 07/2014  . Prediabetes 2014   History reviewed. No pertinent past surgical history. No family history on file. Social History  Substance Use Topics  . Smoking status: Current Every Day Smoker -- 0.30 packs/day    Types: Cigarettes    Last Attempt to Quit: 10/14/2010  . Smokeless tobacco: None  . Alcohol Use: 0.0 oz/week    0 Standard drinks or equivalent per week   Comment: occasionally    Review of Systems  Constitutional: Negative for appetite change and fatigue.  HENT: Negative for congestion, ear discharge and sinus pressure.   Eyes: Negative for discharge.  Respiratory: Positive for shortness of breath and wheezing. Negative for cough.   Cardiovascular: Negative for chest pain.  Gastrointestinal: Negative for abdominal pain and diarrhea.  Genitourinary: Negative for frequency and hematuria.  Musculoskeletal: Negative for back pain.  Skin: Negative for rash.  Neurological: Negative for seizures and headaches.  Psychiatric/Behavioral: Negative for hallucinations.   Allergies  Review of patient's allergies indicates no known allergies.  Home Medications   Prior to Admission medications   Medication Sig Start Date End Date Taking? Authorizing Provider  ADVAIR DISKUS 500-50 MCG/DOSE AEPB INHALE 1 PUFF BY MOUTH TWICE A DAY. RINSE MOUTH AFTER USE. 08/09/14   Fayrene Helper, MD  albuterol (PROVENTIL) (2.5 MG/3ML) 0.083% nebulizer solution TAKE 1 AMPULE (3 MLS) BY NEBULIZATION AS DIRECTED EVERY 4 HOURS AS NEEDED. 10/20/13   Fayrene Helper, MD  aspirin 81 MG tablet Take 81 mg by mouth daily.    Historical Provider, MD  ipratropium (ATROVENT) 0.02 % nebulizer solution INHALE 1 VIAL VIA NEBULIZER EVERY 6-8 HOURS FOR WHEEZING. 05/10/14   Fayrene Helper, MD  Multiple Vitamin (MULTIVITAMIN) tablet Take 1 tablet by mouth daily.    Historical Provider, MD  pravastatin (PRAVACHOL) 40 MG tablet TAKE (1) TABLET  BY MOUTH AT BEDTIME. 08/26/14   Fayrene Helper, MD  triamterene-hydrochlorothiazide (MAXZIDE-25) 37.5-25 MG per tablet TAKE ONE TABLET BY MOUTH ONCE DAILY. 06/10/14   Fayrene Helper, MD  VENTOLIN HFA 108 (90 BASE) MCG/ACT inhaler INHALE 2 PUFFS EVERY 4 HOURS AS NEEDED. 06/28/14   Fayrene Helper, MD   Triage Vitals: BP 176/101 mmHg  Pulse 132  Temp(Src) 98.7 F (37.1 C) (Oral)  Resp 28  Ht 5\' 7"  (1.702 m)  Wt 140 lb (63.504 kg)   BMI 21.92 kg/m2  SpO2 91% Physical Exam  Constitutional: He is oriented to person, place, and time. He appears well-developed.  HENT:  Head: Normocephalic.  Eyes: Conjunctivae and EOM are normal. No scleral icterus.  Neck: Neck supple. No thyromegaly present.  Cardiovascular: Normal rate and regular rhythm.  Exam reveals no gallop and no friction rub.   No murmur heard. Pulmonary/Chest: No stridor. He has wheezes. He has no rales. He exhibits no tenderness.  Decreased breath sounds throughout with bilateral wheezing.   Abdominal: He exhibits no distension. There is no tenderness. There is no rebound.  Musculoskeletal: Normal range of motion. He exhibits no edema.  Lymphadenopathy:    He has no cervical adenopathy.  Neurological: He is oriented to person, place, and time. He exhibits normal muscle tone. Coordination normal.  Skin: No rash noted. No erythema.  Psychiatric: He has a normal mood and affect. His behavior is normal.    ED Course  Procedures (including critical care time) DIAGNOSTIC STUDIES: Oxygen Saturation is 88% on RA, low by my interpretation.    COORDINATION OF CARE: 9:11 AM: Discussed treatment plan which includes breathing treatment, labs, imaging, EKG with pt at bedside; patient verbalizes understanding and agrees with treatment plan.  Labs Review Labs Reviewed - No data to display  Imaging Review No results found. I have personally reviewed and evaluated these images and lab results as part of my medical decision-making.   EKG Interpretation   Date/Time:  Wednesday September 21 2014 08:58:06 EDT Ventricular Rate:  133 PR Interval:  183 QRS Duration: 87 QT Interval:  295 QTC Calculation: 439 R Axis:   68 Text Interpretation:  Sinus tachycardia Consider right atrial enlargement  Probable anteroseptal infarct, old Artifact in lead(s) II aVR aVF V1 V2 V3  V4 V5 V6 Confirmed by Abhi Moccia  MD, Jaren Vanetten ([id number]) on 09/21/2014 12:24:38 PM      MDM   Final  diagnoses:  None   COPD exacerbation. Patient improved with treatment in emergency department.  Labs and x-rays unremarkable. Patient was ambulated through the department was not short of breath pulse ox read around 90%. Will treat with prednisone and Z-Pak and follow-up with primary care M.D.The chart was scribed for me under my direct supervision.  I personally performed the history, physical, and medical decision making and all procedures in the evaluation of this patient.Milton Ferguson, MD 09/21/14 [phone]

## 2014-09-21 NOTE — ED Notes (Signed)
Patient ambulated around hallway with pulse oxymetry. Patient oxygen levels held at 89-90% during walk. Steady gait. Denies increased WOB, shortness of breath, or dizziness with ambulation. Returned to room. MD aware.

## 2014-09-21 NOTE — ED Notes (Signed)
RT called for treatment

## 2014-09-21 NOTE — Discharge Instructions (Signed)
Follow up with your md next week.  Return sooner if problems °

## 2014-09-22 ENCOUNTER — Ambulatory Visit: Payer: PPO | Admitting: Family Medicine

## 2014-10-11 ENCOUNTER — Ambulatory Visit (INDEPENDENT_AMBULATORY_CARE_PROVIDER_SITE_OTHER): Payer: PPO

## 2014-10-11 DIAGNOSIS — Z23 Encounter for immunization: Secondary | ICD-10-CM | POA: Diagnosis not present

## 2014-10-14 ENCOUNTER — Other Ambulatory Visit: Payer: Self-pay

## 2014-10-14 MED ORDER — TRIAMTERENE-HCTZ 37.5-25 MG PO TABS: 1.0000 | ORAL_TABLET | Freq: Every day | ORAL | Status: DC

## 2014-10-24 ENCOUNTER — Other Ambulatory Visit: Payer: Self-pay | Admitting: Family Medicine

## 2014-11-04 ENCOUNTER — Other Ambulatory Visit: Payer: Self-pay | Admitting: Licensed Clinical Social Worker

## 2014-11-04 ENCOUNTER — Encounter: Payer: Self-pay | Admitting: Licensed Clinical Social Worker

## 2014-11-04 DIAGNOSIS — I10 Essential (primary) hypertension: Secondary | ICD-10-CM

## 2014-11-04 NOTE — Patient Outreach (Signed)
Assessment: CSW called client home phone number on 11/04/14.  CSW spoke via phone with client on 11/04/14. CSW verified identity of client.  CSW and client spoke of his seeing Dr. Moshe Cipro as scheduled. Client said he has appointment scheduled with Dr. Darnelle Bos for next month.  Client said he has his prescribed medications and is taking medications as prescribed. CSW and client updated [facility] assessment information on client.  Client said his weight is stable. He is using his moped to travel in the area.  Client said he has adequate food and sometimes goes out to get his meals. CSW informed client on 11/04/14 that [facility] had made [place] care nurse referral for client.  Client said he understood this information and would be glad to speak  with [facility] RN community care nurse. He said he has inhalers and is using inhalers as needed.  CSW reminded client of [facility] nearby and encouraged client to attend social events at that agency when it opens in near future.  CSW encouraged client to be aware of possible support groups at [facility] to help client.. CSW encouraged client to go to [employer] to obtain community calendar of social events in the area that client might attend.  CSW and client spoke of care plan goals for client. CSW has completed CSW support for client.  CSW informed client that [facility] would discharge client from [place] on 11/04/14 since CSW had provided client information needed and had talked with client about social work goals for client.  Client agreed to [place] discharge of client on 11/04/14.  Client aware that he will receive contact from [place] care nurse to discuss nursing needs of client.   Plan: CSW is discharging client on 11/04/14 from [place] services. CSW to inform Lurline Del on 11/04/14 that [facility] discharged client from [place] services on 11/04/14. CSW to send letter to Dr. Moshe Cipro on 11/04/14 informing Dr. Moshe Cipro that [facility]  discharged client on 11/04/14 from [place] services only.   Norva Riffle.Preslie Depasquale MSW, LCSW Licensed Clinical Social Worker [facility] Care Management [phone]

## 2014-11-04 NOTE — Patient Outreach (Signed)
[facility] [address] [facility]) Care Management  11/04/2014  [name] 29-Aug-1950 [id number]   Request from [name], LCSW to assign Community RN, assigned [name], RN.  Thanks, [name]. [place], [place] Assistant Phone: [phone] Fax: [phone]

## 2014-11-07 ENCOUNTER — Other Ambulatory Visit: Payer: Self-pay | Admitting: *Deleted

## 2014-11-07 NOTE — Patient Outreach (Signed)
[facility]) Care Management  11/07/2014  SAEED TOREN May 26, 1950 [id number]  Mr. Molinari was referred to me by [facility] LCSW [name]. Mr. Shea Evans spoke with Mr. Fjeld who agreed for me to contact him so we could discuss his medical conditions and see if he might be amenable to a visit from me.   I was unable to reach Mr. Spicher by phone today on 2 separate attempts. I did leave a HIPPA compliant voice message with him requesting a return call and will attempt to contact him again later this week.    [place] Management  [phone]

## 2014-11-11 ENCOUNTER — Other Ambulatory Visit: Payer: Self-pay | Admitting: *Deleted

## 2014-11-11 NOTE — Patient Outreach (Signed)
[facility]) Care Management  11/11/2014  [name] 09/19/1950 [id number]  I reached [name] by phone today. He was referred to me by [name] LCSW with [facility] because of concerns related to [name] blood pressure management. I offered to engage with [name] and make a home visit or connect him with telephonic case management for assistance with his blood pressure management or any other ongoing health concerns. [name] declined stating he knew how to get in touch with Dr. Griffin Dakin office if he needed anything.   I explained to [name] that [place] management is available to him in the future should he have any needs with which we can assist. He has our phone number and that of Dr. Moshe Cipro.    [place] Management  [phone]

## 2014-11-15 NOTE — Patient Outreach (Signed)
[facility]) Care Management  11/15/2014  JAZZIEL FITZSIMMONS November 29, 1950 [id number]   Notification from [name], RN patient does not wish to engage with home visits from [place] Management.  Notification from [facility], LCSW to close case due to goals met with [place] Management.  Thanks, [name]. [place], [place] Assistant Phone: [phone] Fax: [phone]

## 2014-12-05 ENCOUNTER — Other Ambulatory Visit: Payer: Self-pay | Admitting: Family Medicine

## 2015-01-04 ENCOUNTER — Encounter: Payer: Self-pay | Admitting: Family Medicine

## 2015-01-04 ENCOUNTER — Ambulatory Visit: Payer: PPO | Admitting: Family Medicine

## 2015-01-04 ENCOUNTER — Ambulatory Visit (INDEPENDENT_AMBULATORY_CARE_PROVIDER_SITE_OTHER): Payer: PPO | Admitting: Family Medicine

## 2015-01-04 VITALS — BP 142/90 | HR 96 | Resp 18 | Ht 66.0 in | Wt 138.0 lb

## 2015-01-04 DIAGNOSIS — E785 Hyperlipidemia, unspecified: Secondary | ICD-10-CM

## 2015-01-04 DIAGNOSIS — I10 Essential (primary) hypertension: Secondary | ICD-10-CM

## 2015-01-04 DIAGNOSIS — Z1159 Encounter for screening for other viral diseases: Secondary | ICD-10-CM | POA: Diagnosis not present

## 2015-01-04 DIAGNOSIS — J45909 Unspecified asthma, uncomplicated: Secondary | ICD-10-CM

## 2015-01-04 DIAGNOSIS — R7302 Impaired glucose tolerance (oral): Secondary | ICD-10-CM

## 2015-01-04 DIAGNOSIS — J449 Chronic obstructive pulmonary disease, unspecified: Secondary | ICD-10-CM

## 2015-01-04 MED ORDER — AMLODIPINE BESYLATE 2.5 MG PO TABS: ORAL_TABLET | ORAL | Status: DC

## 2015-01-04 NOTE — Patient Instructions (Signed)
Annual physical exam in 8 weeks , call if you need me sooner  New additional blood pressure medication amlodipine , take at night with cholesterol medication  Fasting lipid, cmp , HBA1c and Hep C screen in 8 weeks  Continue all other medication as before  Thanks for choosing [facility], we consider it a privelige to serve you.  All the best for 2017!

## 2015-01-04 NOTE — Progress Notes (Signed)
Subjective:    Patient ID: [name], male    DOB: [birth year], 63 y.o.   [name]: [id number]  HPI   [name]     [name]: [id number]      DOB: 1950/02/05   HPI [name] is here for follow up and re-evaluation of chronic medical conditions, medication management and review of any available recent lab and radiology data.  Preventive health is updated, specifically  Cancer screening and Immunization.   Questions or concerns regarding consultations or procedures which the PT has had in the interim are  addressed. The PT denies any adverse reactions to current medications since the last visit.  There are no new concerns.  There are no specific complaints   ROS Denies recent fever or chills. Denies sinus pressure, nasal congestion, ear pain or sore throat. Denies chest congestion, productive cough or wheezing. Denies chest pains, palpitations and leg swelling Denies abdominal pain, nausea, vomiting,diarrhea or constipation.   Denies dysuria, frequency, hesitancy or incontinence. Denies joint pain, swelling and limitation in mobility. Denies headaches, seizures, numbness, or tingling. Denies depression, anxiety or insomnia. Denies skin break down or rash.   PE  BP 142/90 mmHg  Pulse 96  Resp 18  Ht 5\' 6"  (1.676 m)  Wt 138 lb 0.6 oz (62.615 kg)  BMI 22.29 kg/m2  SpO2 94%  Patient alert and oriented and in no cardiopulmonary distress.  HEENT: No facial asymmetry, EOMI,   oropharynx pink and moist.  Neck supple no JVD, no mass.  Chest: Clear to auscultation bilaterally.  CVS: S1, S2 no murmurs, no S3.Regular rate.  ABD: Soft non tender.   Ext: No edema  MS: Adequate ROM spine, shoulders, hips and knees.  Skin: Intact, no ulcerations or rash noted.  Psych: Good eye contact, normal affect. Memory intact not anxious or depressed appearing.  CNS: CN 2-12 intact, power,  normal throughout.no focal deficits noted.   Assessment & Plan   Essential  hypertension Uncontrolled, add amlodipine, f/u in 8 weeks DASH diet and commitment to daily physical activity for a minimum of 30 minutes discussed and encouraged, as a part of hypertension management. The importance of attaining a healthy weight is also discussed.  BP/Weight 01/04/2015 09/21/2014 08/04/2014 03/28/2014 11/10/2013 06/16/2013 99991111  Systolic BP A999333 Q000111Q A999333 Q000111Q [id number] 0000000  Diastolic BP 90 87 80 80 82 80 86  Wt. (Lbs) 138.04 140 135.8 146.12 143.12 138.8 134.08  BMI 22.29 21.92 21.93 22.88 22.41 21.73 20.99        Asthma with COPD (chronic obstructive pulmonary disease) Controlled, no change in medication   IGT (impaired glucose tolerance) Patient educated about the importance of limiting  Carbohydrate intake , the need to commit to daily physical activity for a minimum of 30 minutes , and to commit weight loss. The fact that changes in all these areas will reduce or eliminate all together the development of diabetes is stressed.  Updated lab needed at/ before next visit.   Diabetic Labs Latest Ref Rng 09/21/2014 08/23/2014 03/22/2014 11/09/2013 03/09/2013  HbA1c <5.7 % - 6.3(H) 6.4(H) 6.4(H) 6.1(H)  Chol 125 - 200 mg/dL - 156 186 190 177  HDL >=40 mg/dL - 65 77 51 57  Calc LDL <130 mg/dL - 79 100(H) 127(H) 111(H)  Triglycerides <150 mg/dL - 61 47 60 46  Creatinine 0.61 - 1.24 mg/dL 0.98 0.97 1.05 0.90 1.08   BP/Weight 01/04/2015 09/21/2014 08/04/2014 03/28/2014 11/10/2013 06/16/2013 99991111  Systolic BP A999333 Q000111Q A999333 Q000111Q 130  A999333 0000000  Diastolic BP 90 87 80 80 82 80 86  Wt. (Lbs) 138.04 140 135.8 146.12 143.12 138.8 134.08  BMI 22.29 21.92 21.93 22.88 22.41 21.73 20.99   No flowsheet data found.     Hyperlipidemia Hyperlipidemia:Low fat diet discussed and encouraged.   Lipid Panel  Lab Results  Component Value Date   CHOL 156 08/23/2014   HDL 65 08/23/2014   LDLCALC 79 08/23/2014   TRIG 61 08/23/2014   CHOLHDL 2.4 08/23/2014   Controlled, no change in  medication Updated lab needed at/ before next visit.          Review of Systems     Objective:   Physical Exam        Assessment & Plan:

## 2015-01-05 NOTE — Assessment & Plan Note (Signed)
Hyperlipidemia:Low fat diet discussed and encouraged.   Lipid Panel  Lab Results  Component Value Date   CHOL 156 08/23/2014   HDL 65 08/23/2014   LDLCALC 79 08/23/2014   TRIG 61 08/23/2014   CHOLHDL 2.4 08/23/2014   Controlled, no change in medication Updated lab needed at/ before next visit.

## 2015-01-05 NOTE — Assessment & Plan Note (Signed)
Patient educated about the importance of limiting  Carbohydrate intake , the need to commit to daily physical activity for a minimum of 30 minutes , and to commit weight loss. The fact that changes in all these areas will reduce or eliminate all together the development of diabetes is stressed.  Updated lab needed at/ before next visit.   Diabetic Labs Latest Ref Rng 09/21/2014 08/23/2014 03/22/2014 11/09/2013 03/09/2013  HbA1c <5.7 % - 6.3(H) 6.4(H) 6.4(H) 6.1(H)  Chol 125 - 200 mg/dL - 156 186 190 177  HDL >=40 mg/dL - 65 77 51 57  Calc LDL <130 mg/dL - 79 100(H) 127(H) 111(H)  Triglycerides <150 mg/dL - 61 47 60 46  Creatinine 0.61 - 1.24 mg/dL 0.98 0.97 1.05 0.90 1.08   BP/Weight 01/04/2015 09/21/2014 08/04/2014 03/28/2014 11/10/2013 06/16/2013 99991111  Systolic BP A999333 Q000111Q A999333 Q000111Q [id number] A999333 0000000  Diastolic BP 90 87 80 80 82 80 86  Wt. (Lbs) 138.04 140 135.8 146.12 143.12 138.8 134.08  BMI 22.29 21.92 21.93 22.88 22.41 21.73 20.99   No flowsheet data found.

## 2015-01-05 NOTE — Assessment & Plan Note (Signed)
Uncontrolled, add amlodipine, f/u in 8 weeks DASH diet and commitment to daily physical activity for a minimum of 30 minutes discussed and encouraged, as a part of hypertension management. The importance of attaining a healthy weight is also discussed.  BP/Weight 01/04/2015 09/21/2014 08/04/2014 03/28/2014 11/10/2013 06/16/2013 99991111  Systolic BP A999333 Q000111Q A999333 Q000111Q [id number] A999333 0000000  Diastolic BP 90 87 80 80 82 80 86  Wt. (Lbs) 138.04 140 135.8 146.12 143.12 138.8 134.08  BMI 22.29 21.92 21.93 22.88 22.41 21.73 20.99

## 2015-01-05 NOTE — Assessment & Plan Note (Signed)
Controlled, no change in medication  

## 2015-01-10 DIAGNOSIS — J441 Chronic obstructive pulmonary disease with (acute) exacerbation: Secondary | ICD-10-CM | POA: Diagnosis not present

## 2015-01-10 DIAGNOSIS — I10 Essential (primary) hypertension: Secondary | ICD-10-CM | POA: Diagnosis not present

## 2015-01-13 ENCOUNTER — Other Ambulatory Visit: Payer: Self-pay | Admitting: Family Medicine

## 2015-01-26 ENCOUNTER — Other Ambulatory Visit: Payer: Self-pay | Admitting: Family Medicine

## 2015-02-15 DIAGNOSIS — J449 Chronic obstructive pulmonary disease, unspecified: Secondary | ICD-10-CM | POA: Diagnosis not present

## 2015-02-15 DIAGNOSIS — I10 Essential (primary) hypertension: Secondary | ICD-10-CM | POA: Diagnosis not present

## 2015-02-23 ENCOUNTER — Other Ambulatory Visit: Payer: Self-pay | Admitting: Family Medicine

## 2015-02-24 DIAGNOSIS — J449 Chronic obstructive pulmonary disease, unspecified: Secondary | ICD-10-CM | POA: Diagnosis not present

## 2015-03-16 ENCOUNTER — Other Ambulatory Visit: Payer: Self-pay | Admitting: Family Medicine

## 2015-03-16 DIAGNOSIS — I10 Essential (primary) hypertension: Secondary | ICD-10-CM | POA: Diagnosis not present

## 2015-03-16 DIAGNOSIS — E785 Hyperlipidemia, unspecified: Secondary | ICD-10-CM | POA: Diagnosis not present

## 2015-03-16 DIAGNOSIS — Z1159 Encounter for screening for other viral diseases: Secondary | ICD-10-CM | POA: Diagnosis not present

## 2015-03-16 DIAGNOSIS — R7302 Impaired glucose tolerance (oral): Secondary | ICD-10-CM | POA: Diagnosis not present

## 2015-03-17 LAB — COMPREHENSIVE METABOLIC PANEL
ALBUMIN: 4.3 g/dL (ref 3.6–5.1)
ALT: 18 U/L (ref 9–46)
AST: 22 U/L (ref 10–35)
Alkaline Phosphatase: 46 U/L (ref 40–115)
BILIRUBIN TOTAL: 0.7 mg/dL (ref 0.2–1.2)
BUN: 12 mg/dL (ref 7–25)
CALCIUM: 9.4 mg/dL (ref 8.6–10.3)
CHLORIDE: 101 mmol/L (ref 98–110)
CO2: 31 mmol/L (ref 20–31)
Creat: 1 mg/dL (ref 0.70–1.25)
Glucose, Bld: 115 mg/dL — ABNORMAL HIGH (ref 65–99)
POTASSIUM: 3.9 mmol/L (ref 3.5–5.3)
Sodium: 140 mmol/L (ref 135–146)
Total Protein: 6.4 g/dL (ref 6.1–8.1)

## 2015-03-17 LAB — LIPID PANEL
CHOL/HDL RATIO: 2.4 ratio (ref <=5.0)
Cholesterol: 197 mg/dL (ref 125–200)
HDL: 82 mg/dL (ref >=40)
LDL CALC: 102 mg/dL (ref <130)
TRIGLYCERIDES: 65 mg/dL (ref <150)
VLDL: 13 mg/dL (ref <30)

## 2015-03-17 LAB — HEPATITIS C ANTIBODY: HCV Ab: NEGATIVE

## 2015-03-17 LAB — HEMOGLOBIN A1C
Hgb A1c MFr Bld: 6.5 % — ABNORMAL HIGH (ref <5.7)
Mean Plasma Glucose: 140 mg/dL — ABNORMAL HIGH (ref <117)

## 2015-03-24 ENCOUNTER — Encounter: Payer: Self-pay | Admitting: Family Medicine

## 2015-03-24 ENCOUNTER — Ambulatory Visit (INDEPENDENT_AMBULATORY_CARE_PROVIDER_SITE_OTHER): Payer: PPO | Admitting: Family Medicine

## 2015-03-24 VITALS — BP 126/84 | HR 102 | Resp 16 | Ht 66.0 in | Wt 148.0 lb

## 2015-03-24 DIAGNOSIS — Z0000 Encounter for general adult medical examination without abnormal findings: Secondary | ICD-10-CM

## 2015-03-24 DIAGNOSIS — Z1211 Encounter for screening for malignant neoplasm of colon: Secondary | ICD-10-CM

## 2015-03-24 LAB — POC HEMOCCULT BLD/STL (OFFICE/1-CARD/DIAGNOSTIC): Fecal Occult Blood, POC: NEGATIVE

## 2015-03-24 NOTE — Progress Notes (Signed)
   Subjective:    Patient ID: [name], male    DOB: 12/13/1950, 65 y.o.   [name]: [id number]  HPI Patient is in for annual physical exam. No other health concerns are expressed or addressed at the visit. Recent labs, if available are reviewed. Immunization is reviewed , and  updated if needed.    Review of Systems See HPI      Objective:   Physical Exam BP 126/84 mmHg  Pulse 102  Resp 16  Ht 5\' 6"  (1.676 m)  Wt 148 lb (67.132 kg)  BMI 23.90 kg/m2  SpO2 96% Pleasant well nourished male, alert and oriented x 3, in no cardio-pulmonary distress. Afebrile. HEENT No facial trauma or asymetry. Sinuses non tender. EOMI, PERTL,  External ears normal, tympanic membranes clear. Oropharynx moist, no exudate, edentulous. Neck: supple, no adenopathy,JVD or thyromegaly.No bruits.  Chest: Clear to ascultation bilaterally.No crackles or wheezes. Non tender to palpation  Breast: No asymetry,no masses. No nipple discharge or inversion. No axillary or supraclavicular adenopathy  Cardiovascular system; Heart sounds normal,  S1 and  S2 ,no S3.  No murmur, or thrill. Apical beat not displaced Peripheral pulses normal.  Abdomen: Soft, non tender, no organomegaly or masses. No bruits. Bowel sounds normal. No guarding, tenderness or rebound.  Rectal:  Normal sphincter tone. No hemorrhoids or  masses. guaiac negative stool. Prostate smooth and firm  GU: Not examined.  Musculoskeletal exam: Full ROM of spine, hips , shoulders and knees. No deformity ,swelling or crepitus noted. No muscle wasting or atrophy.   Neurologic: Cranial nerves 2 to 12 intact. Power, tone ,sensation and reflexes normal throughout. No disturbance in gait. No tremor.  Skin: Intact, no ulceration, erythema , scaling or rash noted. Pigmentation normal throughout  Psych; Normal mood an flat  affect. Judgement and concentration normal        Assessment & Plan:  Annual physical exam Annual  exam as documented.  Immunization and cancer screening needs are specifically addressed at this visit.

## 2015-03-24 NOTE — Patient Instructions (Addendum)
Annual wellness 3rd week in August , call if you need me before  Labs are improved, drink 2 % milk    No labs for next visit  Thanks for choosing [facility], we consider it a privelige to serve you.

## 2015-03-25 DIAGNOSIS — Z09 Encounter for follow-up examination after completed treatment for conditions other than malignant neoplasm: Secondary | ICD-10-CM | POA: Insufficient documentation

## 2015-03-25 NOTE — Assessment & Plan Note (Signed)
Annual exam as documented. . Immunization and cancer screening needs are specifically addressed at this visit.  

## 2015-03-27 DIAGNOSIS — I10 Essential (primary) hypertension: Secondary | ICD-10-CM | POA: Diagnosis not present

## 2015-03-27 DIAGNOSIS — J441 Chronic obstructive pulmonary disease with (acute) exacerbation: Secondary | ICD-10-CM | POA: Diagnosis not present

## 2015-03-29 ENCOUNTER — Other Ambulatory Visit: Payer: Self-pay | Admitting: Family Medicine

## 2015-03-31 DIAGNOSIS — R972 Elevated prostate specific antigen [PSA]: Secondary | ICD-10-CM | POA: Diagnosis not present

## 2015-04-07 ENCOUNTER — Ambulatory Visit (INDEPENDENT_AMBULATORY_CARE_PROVIDER_SITE_OTHER): Payer: PPO | Admitting: Urology

## 2015-04-07 DIAGNOSIS — N400 Enlarged prostate without lower urinary tract symptoms: Secondary | ICD-10-CM

## 2015-04-07 DIAGNOSIS — R972 Elevated prostate specific antigen [PSA]: Secondary | ICD-10-CM

## 2015-04-07 DIAGNOSIS — N5201 Erectile dysfunction due to arterial insufficiency: Secondary | ICD-10-CM | POA: Diagnosis not present

## 2015-04-20 ENCOUNTER — Other Ambulatory Visit: Payer: Self-pay | Admitting: Family Medicine

## 2015-05-06 ENCOUNTER — Other Ambulatory Visit: Payer: Self-pay | Admitting: Family Medicine

## 2015-05-12 DIAGNOSIS — J441 Chronic obstructive pulmonary disease with (acute) exacerbation: Secondary | ICD-10-CM | POA: Diagnosis not present

## 2015-05-16 ENCOUNTER — Other Ambulatory Visit: Payer: Self-pay | Admitting: Family Medicine

## 2015-05-16 DIAGNOSIS — J441 Chronic obstructive pulmonary disease with (acute) exacerbation: Secondary | ICD-10-CM | POA: Diagnosis not present

## 2015-05-16 DIAGNOSIS — I10 Essential (primary) hypertension: Secondary | ICD-10-CM | POA: Diagnosis not present

## 2015-05-16 DIAGNOSIS — J449 Chronic obstructive pulmonary disease, unspecified: Secondary | ICD-10-CM | POA: Diagnosis not present

## 2015-05-30 ENCOUNTER — Emergency Department (HOSPITAL_COMMUNITY): Payer: PPO

## 2015-05-30 ENCOUNTER — Encounter (HOSPITAL_COMMUNITY): Payer: Self-pay

## 2015-05-30 ENCOUNTER — Inpatient Hospital Stay (HOSPITAL_COMMUNITY)
Admission: EM | Admit: 2015-05-30 | Discharge: 2015-06-01 | DRG: 190 | Disposition: A | Discharge status: Home / Self Care | Payer: PPO | Attending: Internal Medicine | Admitting: Internal Medicine

## 2015-05-30 DIAGNOSIS — Z87891 Personal history of nicotine dependence: Secondary | ICD-10-CM | POA: Diagnosis not present

## 2015-05-30 DIAGNOSIS — R05 Cough: Secondary | ICD-10-CM | POA: Diagnosis not present

## 2015-05-30 DIAGNOSIS — Z8673 Personal history of transient ischemic attack (TIA), and cerebral infarction without residual deficits: Secondary | ICD-10-CM

## 2015-05-30 DIAGNOSIS — J449 Chronic obstructive pulmonary disease, unspecified: Secondary | ICD-10-CM | POA: Diagnosis present

## 2015-05-30 DIAGNOSIS — E785 Hyperlipidemia, unspecified: Secondary | ICD-10-CM | POA: Diagnosis present

## 2015-05-30 DIAGNOSIS — Z79899 Other long term (current) drug therapy: Secondary | ICD-10-CM | POA: Diagnosis not present

## 2015-05-30 DIAGNOSIS — R0602 Shortness of breath: Secondary | ICD-10-CM | POA: Diagnosis not present

## 2015-05-30 DIAGNOSIS — J431 Panlobular emphysema: Secondary | ICD-10-CM

## 2015-05-30 DIAGNOSIS — J441 Chronic obstructive pulmonary disease with (acute) exacerbation: Principal | ICD-10-CM | POA: Diagnosis present

## 2015-05-30 DIAGNOSIS — J45901 Unspecified asthma with (acute) exacerbation: Secondary | ICD-10-CM

## 2015-05-30 DIAGNOSIS — J9601 Acute respiratory failure with hypoxia: Secondary | ICD-10-CM | POA: Diagnosis present

## 2015-05-30 DIAGNOSIS — J9600 Acute respiratory failure, unspecified whether with hypoxia or hypercapnia: Secondary | ICD-10-CM

## 2015-05-30 DIAGNOSIS — Z7982 Long term (current) use of aspirin: Secondary | ICD-10-CM

## 2015-05-30 DIAGNOSIS — R0682 Tachypnea, not elsewhere classified: Secondary | ICD-10-CM | POA: Diagnosis not present

## 2015-05-30 DIAGNOSIS — I10 Essential (primary) hypertension: Secondary | ICD-10-CM | POA: Diagnosis not present

## 2015-05-30 DIAGNOSIS — Z7951 Long term (current) use of inhaled steroids: Secondary | ICD-10-CM | POA: Diagnosis not present

## 2015-05-30 LAB — BLOOD GAS, ARTERIAL
Acid-Base Excess: 2.6 mmol/L — ABNORMAL HIGH (ref 0.0–2.0)
BICARBONATE: 25.5 meq/L — AB (ref 20.0–24.0)
DELIVERY SYSTEMS: POSITIVE
DRAWN BY: 221791
Expiratory PAP: 5
FIO2: 40
Inspiratory PAP: 16
O2 Saturation: 98.8 %
PATIENT TEMPERATURE: 37
PO2 ART: 167 mmHg — AB (ref 80.0–100.0)
RATE: 16 resp/min
TCO2: 19.1 mmol/L (ref 0–100)
pCO2 arterial: 58.4 mmHg (ref 35.0–45.0)
pH, Arterial: 7.307 — ABNORMAL LOW (ref 7.350–7.450)

## 2015-05-30 LAB — CBC
HCT: 44.2 % (ref 39.0–52.0)
Hemoglobin: 14.4 g/dL (ref 13.0–17.0)
MCH: 30.5 pg (ref 26.0–34.0)
MCHC: 32.6 g/dL (ref 30.0–36.0)
MCV: 93.6 fL (ref 78.0–100.0)
PLATELETS: 203 10*3/uL (ref 150–400)
RBC: 4.72 MIL/uL (ref 4.22–5.81)
RDW: 13.2 % (ref 11.5–15.5)
WBC: 6.1 10*3/uL (ref 4.0–10.5)

## 2015-05-30 LAB — BASIC METABOLIC PANEL
Anion gap: 11 (ref 5–15)
BUN: 18 mg/dL (ref 6–20)
CHLORIDE: 101 mmol/L (ref 101–111)
CO2: 29 mmol/L (ref 22–32)
CREATININE: 0.87 mg/dL (ref 0.61–1.24)
Calcium: 9.3 mg/dL (ref 8.9–10.3)
GFR calc Af Amer: >60 mL/min (ref >60)
GFR calc non Af Amer: >60 mL/min (ref >60)
Glucose, Bld: 143 mg/dL — ABNORMAL HIGH (ref 65–99)
Potassium: 3.5 mmol/L (ref 3.5–5.1)
SODIUM: 141 mmol/L (ref 135–145)

## 2015-05-30 LAB — TROPONIN I
Troponin I: <0.03 ng/mL (ref <0.031)
Troponin I: <0.03 ng/mL (ref <0.031)
Troponin I: <0.03 ng/mL (ref <0.031)

## 2015-05-30 LAB — BRAIN NATRIURETIC PEPTIDE: B Natriuretic Peptide: 8 pg/mL (ref 0.0–100.0)

## 2015-05-30 MED ORDER — SODIUM CHLORIDE 0.9% FLUSH
3.0000 mL | Freq: Two times a day (BID) | INTRAVENOUS | Status: DC
  Administered 2015-05-31 – 2015-06-01 (×2): 3 mL via INTRAVENOUS

## 2015-05-30 MED ORDER — METHYLPREDNISOLONE SODIUM SUCC 125 MG IJ SOLR: 125.0000 mg | Freq: Once | INTRAMUSCULAR | Status: DC

## 2015-05-30 MED ORDER — MOMETASONE FURO-FORMOTEROL FUM 200-5 MCG/ACT IN AERO
2.0000 | INHALATION_SPRAY | Freq: Two times a day (BID) | RESPIRATORY_TRACT | Status: DC
  Administered 2015-05-31 – 2015-06-01 (×3): 2 via RESPIRATORY_TRACT
  Filled 2015-05-30: qty 8.8

## 2015-05-30 MED ORDER — PRAVASTATIN SODIUM 40 MG PO TABS
40.0000 mg | ORAL_TABLET | Freq: Every day | ORAL | Status: DC
  Administered 2015-05-30 – 2015-05-31 (×2): 40 mg via ORAL
  Filled 2015-05-30 (×2): qty 1

## 2015-05-30 MED ORDER — IPRATROPIUM BROMIDE 0.02 % IN SOLN: 0.5000 mg | Freq: Once | RESPIRATORY_TRACT | Status: DC

## 2015-05-30 MED ORDER — IPRATROPIUM BROMIDE 0.02 % IN SOLN
0.5000 mg | Freq: Four times a day (QID) | RESPIRATORY_TRACT | Status: DC
  Administered 2015-05-30 – 2015-06-01 (×8): 0.5 mg via RESPIRATORY_TRACT
  Filled 2015-05-30 (×9): qty 2.5

## 2015-05-30 MED ORDER — SODIUM CHLORIDE 0.9 % IV SOLN: INTRAVENOUS | Status: DC

## 2015-05-30 MED ORDER — ALBUTEROL (5 MG/ML) CONTINUOUS INHALATION SOLN
10.0000 mg/h | INHALATION_SOLUTION | Freq: Once | RESPIRATORY_TRACT | Status: AC
  Administered 2015-05-30: 10 mg/h via RESPIRATORY_TRACT
  Filled 2015-05-30: qty 20

## 2015-05-30 MED ORDER — ADULT MULTIVITAMIN W/MINERALS CH
1.0000 | ORAL_TABLET | Freq: Every day | ORAL | Status: DC
  Administered 2015-05-30 – 2015-06-01 (×3): 1 via ORAL
  Filled 2015-05-30 (×3): qty 1

## 2015-05-30 MED ORDER — LEVOFLOXACIN IN D5W 750 MG/150ML IV SOLN
750.0000 mg | INTRAVENOUS | Status: DC
  Administered 2015-05-30 – 2015-05-31 (×2): 750 mg via INTRAVENOUS
  Filled 2015-05-30 (×2): qty 150

## 2015-05-30 MED ORDER — MAGNESIUM SULFATE 2 GM/50ML IV SOLN
2.0000 g | Freq: Once | INTRAVENOUS | Status: AC
  Administered 2015-05-30: 2 g via INTRAVENOUS

## 2015-05-30 MED ORDER — ASPIRIN 81 MG PO CHEW
81.0000 mg | CHEWABLE_TABLET | Freq: Every day | ORAL | Status: DC
  Administered 2015-05-30 – 2015-06-01 (×3): 81 mg via ORAL
  Filled 2015-05-30 (×3): qty 1

## 2015-05-30 MED ORDER — ALBUTEROL (5 MG/ML) CONTINUOUS INHALATION SOLN: 10.0000 mg/h | INHALATION_SOLUTION | RESPIRATORY_TRACT | Status: DC

## 2015-05-30 MED ORDER — ONDANSETRON HCL 4 MG/2ML IJ SOLN: 4.0000 mg | Freq: Four times a day (QID) | INTRAMUSCULAR | Status: DC | PRN

## 2015-05-30 MED ORDER — MAGNESIUM SULFATE 2 GM/50ML IV SOLN
INTRAVENOUS | Status: AC
  Filled 2015-05-30: qty 50

## 2015-05-30 MED ORDER — ACETAMINOPHEN 325 MG PO TABS
650.0000 mg | ORAL_TABLET | Freq: Four times a day (QID) | ORAL | Status: DC | PRN
  Administered 2015-05-31: 650 mg via ORAL
  Filled 2015-05-30: qty 2

## 2015-05-30 MED ORDER — TRIAMTERENE-HCTZ 37.5-25 MG PO TABS
1.0000 | ORAL_TABLET | Freq: Every day | ORAL | Status: DC
Start: 2015-05-30 — End: 2015-06-01
  Administered 2015-05-30 – 2015-06-01 (×3): 1 via ORAL
  Filled 2015-05-30 (×3): qty 1

## 2015-05-30 MED ORDER — ALBUTEROL SULFATE (2.5 MG/3ML) 0.083% IN NEBU
2.5000 mg | INHALATION_SOLUTION | RESPIRATORY_TRACT | Status: DC | PRN
Start: 2015-05-30 — End: 2015-06-01

## 2015-05-30 MED ORDER — ONDANSETRON HCL 4 MG PO TABS: 4.0000 mg | ORAL_TABLET | Freq: Four times a day (QID) | ORAL | Status: DC | PRN

## 2015-05-30 MED ORDER — METHYLPREDNISOLONE SODIUM SUCC 125 MG IJ SOLR
60.0000 mg | Freq: Four times a day (QID) | INTRAMUSCULAR | Status: DC
  Administered 2015-05-30 – 2015-05-31 (×3): 60 mg via INTRAVENOUS
  Filled 2015-05-30 (×3): qty 2

## 2015-05-30 MED ORDER — SODIUM CHLORIDE 0.9% FLUSH: 3.0000 mL | INTRAVENOUS | Status: DC | PRN

## 2015-05-30 MED ORDER — ALBUTEROL (5 MG/ML) CONTINUOUS INHALATION SOLN: 10.0000 mg/h | INHALATION_SOLUTION | Freq: Once | RESPIRATORY_TRACT | Status: DC

## 2015-05-30 MED ORDER — MORPHINE SULFATE (PF) 2 MG/ML IV SOLN
1.0000 mg | INTRAVENOUS | Status: DC | PRN
  Administered 2015-05-30 – 2015-06-01 (×2): 1 mg via INTRAVENOUS
  Filled 2015-05-30 (×2): qty 1

## 2015-05-30 MED ORDER — IPRATROPIUM BROMIDE 0.02 % IN SOLN
1.0000 mg | Freq: Once | RESPIRATORY_TRACT | Status: AC
  Administered 2015-05-30: 1 mg via RESPIRATORY_TRACT
  Filled 2015-05-30: qty 5

## 2015-05-30 MED ORDER — ACETAMINOPHEN 650 MG RE SUPP: 650.0000 mg | Freq: Four times a day (QID) | RECTAL | Status: DC | PRN

## 2015-05-30 MED ORDER — METHYLPREDNISOLONE SODIUM SUCC 125 MG IJ SOLR
125.0000 mg | Freq: Once | INTRAMUSCULAR | Status: AC
  Administered 2015-05-30: 125 mg via INTRAVENOUS
  Filled 2015-05-30: qty 2

## 2015-05-30 MED ORDER — ENOXAPARIN SODIUM 40 MG/0.4ML ~~LOC~~ SOLN
40.0000 mg | SUBCUTANEOUS | Status: DC
  Administered 2015-05-30 – 2015-05-31 (×2): 40 mg via SUBCUTANEOUS
  Filled 2015-05-30 (×2): qty 0.4

## 2015-05-30 MED ORDER — SENNOSIDES-DOCUSATE SODIUM 8.6-50 MG PO TABS: 1.0000 | ORAL_TABLET | Freq: Every evening | ORAL | Status: DC | PRN

## 2015-05-30 MED ORDER — SODIUM CHLORIDE 0.9 % IV SOLN
INTRAVENOUS | Status: DC
  Administered 2015-05-30: 11:00:00 via INTRAVENOUS

## 2015-05-30 MED ORDER — SODIUM CHLORIDE 0.9 % IV SOLN: 250.0000 mL | INTRAVENOUS | Status: DC | PRN

## 2015-05-30 NOTE — ED Provider Notes (Signed)
CSN: [id number]     Arrival date & time 05/30/15  1002 History  By signing my name below, I, Emmanuella Mensah, attest that this documentation has been prepared under the direction and in the presence of Dorie Rank, MD. Electronically Signed: [name], ED Scribe. 05/30/2015. 11:36 AM.     Chief Complaint  Patient presents with  . Shortness of Breath   Patient is a 66 y.o. male presenting with shortness of breath. The history is provided by the patient. No language interpreter was used.  Shortness of Breath Severity:  Moderate Onset quality:  Sudden Timing:  Constant Progression:  Worsening Chronicity:  Recurrent Relieved by:  Nothing Worsened by:  Nothing tried Ineffective treatments:  Oxygen Associated symptoms: cough   Associated symptoms: no chest pain and no fever    HPI Comments: [name] is a 65 y.o. male with a hx of COPD, asthma, and HTN who presents to the Emergency Department complaining of sudden onset of gradually worsening SOB onset this am. He reports associated non-productive cough onset several days. He denies that he is a current smoker (quit 6 years ago). Pt was sent here by his PCP for evaluation of SOB this am. He was given an albuterol treatment at PCP's office with no relief. He denies any CP, leg swelling, fever, or chills.    Past Medical History  Diagnosis Date  . Nicotine addiction   . COPD (chronic obstructive pulmonary disease) ([place])   . Depression   . Hyperlipidemia   . Hypertension   . CVA (cerebral vascular accident) ([place]) 2008    with temporary vision loss   . Asthma   . Marijuana abuse   . History of tobacco abuse 01/14/2011  . History of substance abuse 01/14/2011    Marijuana  . Elevated PSA 2014    no diagnosis pf prostate cancer in 07/2014  . Prediabetes 2014   History reviewed. No pertinent past surgical history. No family history on file. Social History  Substance Use Topics  . Smoking status: Former Smoker -- 0.30  packs/day    Types: Cigarettes    Quit date: 10/14/2010  . Smokeless tobacco: None  . Alcohol Use: 0.0 oz/week    0 Standard drinks or equivalent per week     Comment: occasionally    Review of Systems  Constitutional: Negative for fever and chills.  Respiratory: Positive for cough and shortness of breath.   Cardiovascular: Negative for chest pain and leg swelling.  All other systems reviewed and are negative.     Allergies  Review of patient's allergies indicates no known allergies.  Home Medications   Prior to Admission medications   Medication Sig Start Date End Date Taking? Authorizing Provider  ADVAIR DISKUS 500-50 MCG/DOSE AEPB INHALE 1 PUFF BY MOUTH TWICE A DAY. RINSE MOUTH AFTER USE. 03/17/15   Fayrene Helper, MD  albuterol (PROVENTIL) (2.5 MG/3ML) 0.083% nebulizer solution TAKE 1 AMPULE (3 MLS) BY NEBULIZATION AS DIRECTED EVERY 4 HOURS AS NEEDED. 05/18/15   Fayrene Helper, MD  amLODipine (NORVASC) 2.5 MG tablet TAKE 1 TABLET BY MOUTH EVERY NIGHT FOR BLOOD PRESSURE. 05/08/15   Fayrene Helper, MD  aspirin 81 MG tablet Take 81 mg by mouth daily.    Historical Provider, MD  ipratropium (ATROVENT) 0.02 % nebulizer solution INHALE 1 VIAL VIA NEBULIZER EVERY 6-8 HOURS FOR WHEEZING. 05/18/15   Fayrene Helper, MD  Multiple Vitamin (MULTIVITAMIN) tablet Take 1 tablet by mouth daily.    Historical  Provider, MD  pravastatin (PRAVACHOL) 40 MG tablet TAKE (1) TABLET BY MOUTH AT BEDTIME. 01/27/15   Fayrene Helper, MD  triamterene-hydrochlorothiazide (MAXZIDE-25) 37.5-25 MG tablet TAKE ONE TABLET BY MOUTH ONCE DAILY. 04/20/15   Fayrene Helper, MD  VENTOLIN HFA 108 [phone] Base) MCG/ACT inhaler INHALE 2 PUFFS EVERY 4 HOURS AS NEEDED. 03/29/15   Fayrene Helper, MD   BP 123/80 mmHg  Pulse 110  Temp(Src) 98.4 F (36.9 C) (Oral)  Resp 16  Ht 5\' 7"  (1.702 m)  Wt 60.782 kg  BMI 20.98 kg/m2  SpO2 100% Physical Exam  Constitutional: He appears well-developed and  well-nourished. No distress.  HENT:  Head: Normocephalic and atraumatic.  Right Ear: External ear normal.  Left Ear: External ear normal.  Eyes: Conjunctivae are normal. Right eye exhibits no discharge. Left eye exhibits no discharge. No scleral icterus.  Neck: Neck supple. No tracheal deviation present.  Cardiovascular: Normal rate, regular rhythm and intact distal pulses.   Pulmonary/Chest: Accessory muscle usage present. No stridor. Tachypnea noted. No respiratory distress. He has decreased breath sounds. He has wheezes. He has no rales.  Primarily diminished breath sounds bilaterally, very faint wheeze noted on expiration.   Abdominal: Soft. Bowel sounds are normal. He exhibits no distension. There is no tenderness. There is no rebound and no guarding.  Musculoskeletal: He exhibits no edema or tenderness.  Neurological: He is alert. He has normal strength. No cranial nerve deficit (no facial droop, extraocular movements intact, no slurred speech) or sensory deficit. He exhibits normal muscle tone. He displays no seizure activity. Coordination normal.  Skin: Skin is warm and dry. No rash noted.  Psychiatric: He has a normal mood and affect.  Nursing note and vitals reviewed.   ED Course  .Critical Care Performed by: Dorie Rank Authorized by: Dorie Rank Total critical care time: 45 minutes Critical care was necessary to treat or prevent imminent or life-threatening deterioration of the following conditions: respiratory failure. Critical care was time spent personally by me on the following activities: discussions with consultants, evaluation of patient's response to treatment, examination of patient, obtaining history from patient or surrogate, ordering and performing treatments and interventions, ordering and review of radiographic studies and ordering and review of laboratory studies.     Medications  0.9 %  sodium chloride infusion ( Intravenous New Bag/Given 05/30/15 1113)   magnesium sulfate 2 GM/50ML IVPB (  Not Given 05/30/15 1130)  albuterol (PROVENTIL,VENTOLIN) solution continuous neb (10 mg/hr Nebulization Given 05/30/15 1034)  methylPREDNISolone sodium succinate (SOLU-MEDROL) 125 mg/2 mL injection 125 mg (125 mg Intravenous Given 05/30/15 1046)  ipratropium (ATROVENT) nebulizer solution 1 mg (1 mg Nebulization Given 05/30/15 1034)  magnesium sulfate IVPB 2 g 50 mL (2 g Intravenous New Bag/Given 05/30/15 1130)    DIAGNOSTIC STUDIES: Oxygen Saturation is 100% on , nl by my interpretation.    COORDINATION OF CARE: 10:23 AM- Pt advised of plan for treatment and pt agrees. Pt will receive Albuterol, Solumedrol, and Atrovent for SOB. He will also receive chest x-ray, EKG, and lab work for further evaluation.   11:05 AM - Worsening respiratory distress. Pt able to speak but now tachycardiac, increased work of breating. BiPAP ordered. Pt may require intubation if BiPAP does not work.   11:35 AM- Pt notes some improvement with BiPAP. Less anxious, breathing a little more easily.   Labs Review Labs Reviewed  BASIC METABOLIC PANEL - Abnormal; Notable for the following:    Glucose, Bld 143 (*)  All other components within normal limits  BLOOD GAS, ARTERIAL - Abnormal; Notable for the following:    pH, Arterial 7.307 (*)    pCO2 arterial 58.4 (*)    pO2, Arterial 167 (*)    Bicarbonate 25.5 (*)    Acid-Base Excess 2.6 (*)    All other components within normal limits  CBC  TROPONIN I  BRAIN NATRIURETIC PEPTIDE    Imaging Review Dg Chest Port 1 View  05/30/2015  CLINICAL DATA:  New onset shortness of breath today. Slight cough. Initial encounter. EXAM: PORTABLE CHEST 1 VIEW COMPARISON:  One-view chest x-ray 09/21/2014 FINDINGS: The heart size is normal. Emphysematous changes are again noted. There is no edema or effusion to suggest failure. No focal airspace disease is present. The visualized soft tissues and bony thorax are unremarkable. IMPRESSION: 1. No  acute cardiopulmonary disease or significant interval change. 2. Emphysema Electronically Signed   By: San Morelle M.D.   On: 05/30/2015 10:53     Dorie Rank, MD has personally reviewed and evaluated these images and lab results as part of his medical decision-making.   EKG Interpretation   Date/Time:  Tuesday May 30 2015 10:10:22 EDT Ventricular Rate:  118 PR Interval:  159 QRS Duration: 94 QT Interval:  331 QTC Calculation: 464 R Axis:   53 Text Interpretation:  Sinus tachycardia Multiform ventricular premature  complexes Borderline T abnormalities, inferior leads Baseline wander in  lead(s) V1 Poor data quality No significant change since last tracing  Confirmed by Esai Stecklein  MD-J, Tayson Schnelle [id number]) on 05/30/2015 12:16:29 PM      MDM   Final diagnoses:  Acute exacerbation of COPD with asthma ([place])   Pt presented to the ED with severe shortness of breath.  Sx consistent with COPD exacerbation.   Labs and xrays are consistent with this.  Sx worsened and pt was started on Bipap.  Significant improvement of his sx and distress.  Eventually able to remove Bipap and pt was admitted for further treatment.  I personally performed the services described in this documentation, which was scribed in my presence.  The recorded information has been reviewed and is accurate.   Dorie Rank, MD 05/31/15 1044

## 2015-05-30 NOTE — ED Notes (Signed)
PT taking off his nebulizer mask and urged to leave it on and stating he can't breathe. EDP made aware and new order for Bipap at this this and prepare for intubation if needed.

## 2015-05-30 NOTE — Progress Notes (Signed)
Pharmacy Antibiotic Note  [name] is a 65 y.o. male admitted on 05/30/2015 with pneumonia.  Pharmacy has been consulted for levaquin dosing.  Plan: Levaquin 750 mg IV q24 hours F/u cultures and clinical course  Height: 5\' 7"  (170.2 cm) Weight: 127 lb 6.8 oz (57.8 kg) IBW/kg (Calculated) : 66.1  Temp (24hrs), Avg:98.2 F (36.8 C), Min:98 F (36.7 C), Max:98.4 F (36.9 C)   Recent Labs Lab 05/30/15 1050  WBC 6.1  CREATININE 0.87    Estimated Creatinine Clearance: 67.4 mL/min (by C-G formula based on Cr of 0.87).    No Known Allergies  Antimicrobials this admission: 5/23 levaquin >>   Thank you for allowing pharmacy to be a part of this patient's care.  Excell Seltzer Poteet 05/30/2015 5:08 PM

## 2015-05-30 NOTE — ED Notes (Signed)
CRITICAL VALUE ALERT  Critical value received:  ABG: PH- 7.307, CO2 58.4, PO2 167, Bicarb 25.5, Sat 98%  Date of notification:  05/30/15  Time of notification:  1150  Critical value read back:Yes.    Nurse who received alert:  RMinter, RN  MD notified (1st page):  Dr. Alesia Banda  Time of first page:  1151  MD notified (2nd page):  Time of second page:  Responding MD:  1151  Time MD responded:  Dr. Alesia Banda

## 2015-05-30 NOTE — ED Notes (Signed)
PT voiced decreased SOB on Bipap and pt seems less anxious.

## 2015-05-30 NOTE — ED Notes (Signed)
Pt here from PCP for evaluation of SOB. Pt was given an albuterol breathing treatment at PCP's office

## 2015-05-30 NOTE — H&P (Signed)
History and Physical    [name] [id number] DOB: 11/09/1950 DOA: 05/30/2015  Referring MD/NP/PA: Marye Round, EDP PCP: [name], MD  Patient coming from: Home  Chief Complaint: Shortness of breath  HPI: [name] is a 65 y.o. male with history of COPD, hypertension, hyperlipidemia who presents to the hospital today with shortness of breath that began this morning. He reports associated nonproductive cough that began a few days ago. States he quit smoking about 6 years ago. Went to see his PCP and was sent here for evaluation. He was found to have tight breath sounds in the ED and was placed on BiPAP with improvement. He was given several nebulizer treatments. Labs are essentially within normal limits, ABG after initiation of BiPAP shows a pH of 7.30 with a PCO2 of 58 and a PO2 of 167 bicarbonate of 26. Chest x-ray shows emphysema without acute disease. We have been asked to admit him for further evaluation and management.   Past Medical History  Diagnosis Date  . Nicotine addiction   . COPD (chronic obstructive pulmonary disease) ([place])   . Depression   . Hyperlipidemia   . Hypertension   . CVA (cerebral vascular accident) ([place]) 2008    with temporary vision loss   . Asthma   . Marijuana abuse   . History of tobacco abuse 01/14/2011  . History of substance abuse 01/14/2011    Marijuana  . Elevated PSA 2014    no diagnosis pf prostate cancer in 07/2014  . Prediabetes 2014    History reviewed. No pertinent past surgical history.  Social History:   reports that he quit smoking about 4 years ago. His smoking use included Cigarettes. He smoked 0.30 packs per day. He does not have any smokeless tobacco history on file. He reports that he drinks alcohol. He reports that he does not use illicit drugs.  No Known Allergies  Family History: No HTN/DM/CVA in any family members   Prior to Admission medications   Medication Sig Start Date End Date Taking? Authorizing  Provider  ADVAIR DISKUS 500-50 MCG/DOSE AEPB INHALE 1 PUFF BY MOUTH TWICE A DAY. RINSE MOUTH AFTER USE. 03/17/15  Yes Fayrene Helper, MD  albuterol (PROVENTIL) (2.5 MG/3ML) 0.083% nebulizer solution TAKE 1 AMPULE (3 MLS) BY NEBULIZATION AS DIRECTED EVERY 4 HOURS AS NEEDED. 05/18/15  Yes Fayrene Helper, MD  aspirin 81 MG tablet Take 81 mg by mouth daily.   Yes Historical Provider, MD  COMBIVENT RESPIMAT 20-100 MCG/ACT AERS respimat Take 1 puff by mouth every 6 (six) hours as needed.  05/12/15  Yes Historical Provider, MD  ipratropium (ATROVENT) 0.02 % nebulizer solution INHALE 1 VIAL VIA NEBULIZER EVERY 6-8 HOURS FOR WHEEZING. 05/18/15  Yes Fayrene Helper, MD  Multiple Vitamin (MULTIVITAMIN) tablet Take 1 tablet by mouth daily.   Yes Historical Provider, MD  pravastatin (PRAVACHOL) 40 MG tablet TAKE (1) TABLET BY MOUTH AT BEDTIME. 01/27/15  Yes Fayrene Helper, MD  predniSONE (DELTASONE) 10 MG tablet Take 4 tabs by mouth for 3 days, 3 tabs for 3 days,2 tabs for 3 days, 1 tab for 3 days. 05/24/15  Yes Historical Provider, MD  triamterene-hydrochlorothiazide ([id number]) 37.5-25 MG tablet TAKE ONE TABLET BY MOUTH ONCE DAILY. 04/20/15  Yes Fayrene Helper, MD  VENTOLIN HFA 108 [phone] Base) MCG/ACT inhaler INHALE 2 PUFFS EVERY 4 HOURS AS NEEDED. 03/29/15  Yes Fayrene Helper, MD  amLODipine (NORVASC) 2.5 MG tablet TAKE 1 TABLET BY MOUTH EVERY  NIGHT FOR BLOOD PRESSURE. Patient not taking: Reported on 05/30/2015 05/08/15   Fayrene Helper, MD    Review of Systems:  Constitutional: Denies fever, chills, diaphoresis, appetite change and fatigue.  HEENT: Denies photophobia, eye pain, redness, hearing loss, ear pain, congestion, sore throat, rhinorrhea, sneezing, mouth sores, trouble swallowing, neck pain, neck stiffness and tinnitus.   Respiratory: Positive for SOB, DOE, cough, chest tightness,  and wheezing.   Cardiovascular: Denies chest pain, palpitations and leg swelling.  Gastrointestinal:  Denies nausea, vomiting, abdominal pain, diarrhea, constipation, blood in stool and abdominal distention.  Genitourinary: Denies dysuria, urgency, frequency, hematuria, flank pain and difficulty urinating.  Endocrine: Denies: hot or cold intolerance, sweats, changes in hair or nails, polyuria, polydipsia. Musculoskeletal: Denies myalgias, back pain, joint swelling, arthralgias and gait problem.  Skin: Denies pallor, rash and wound.  Neurological: Denies dizziness, seizures, syncope, weakness, light-headedness, numbness and headaches.  Hematological: Denies adenopathy. Easy bruising, personal or family bleeding history  Psychiatric/Behavioral: Denies suicidal ideation, mood changes, confusion, nervousness, sleep disturbance and agitation    Physical Exam: Filed Vitals:   05/30/15 1500 05/30/15 1515 05/30/15 1542 05/30/15 1557  BP: 132/90     Pulse: 103 98    Temp:  98 F (36.7 C)    TempSrc:  Oral    Resp: 17 20    Height:   5\' 7"  (1.702 m)   Weight:   57.8 kg (127 lb 6.8 oz)   SpO2: 100% 100%  100%     Constitutional: NAD, calm, comfortable Eyes: PERRL, lids and conjunctivae normal ENMT: Mucous membranes are moist. Posterior pharynx clear of any exudate or lesions.Normal dentition.  Neck: normal, supple, no masses, no thyromegaly Respiratory: Poor air movement, no wheezes or crackles identified  Cardiovascular: Regular rate and rhythm, no murmurs / rubs / gallops. No extremity edema. 2+ pedal pulses. No carotid bruits.  Abdomen: no tenderness, no masses palpated. No hepatosplenomegaly. Bowel sounds positive.  Musculoskeletal: no clubbing / cyanosis. No joint deformity upper and lower extremities. Good ROM, no contractures. Normal muscle tone.  Skin: no rashes, lesions, ulcers. No induration Neurologic: CN 2-12 grossly intact. Sensation intact, DTR normal. Strength 5/5 in all 4.  Psychiatric: Normal judgment and insight. Alert and oriented x 3. Normal mood.    Labs on Admission:  I have personally reviewed following labs and imaging studies  CBC:  Recent Labs Lab 05/30/15 1050  WBC 6.1  HGB 14.4  HCT 44.2  MCV 93.6  PLT [id number]   Basic Metabolic Panel:  Recent Labs Lab 05/30/15 1050  NA 141  K 3.5  CL 101  CO2 29  GLUCOSE 143*  BUN 18  CREATININE 0.87  CALCIUM 9.3   GFR: Estimated Creatinine Clearance: 67.4 mL/min (by C-G formula based on Cr of 0.87). Liver Function Tests: No results for input(s): AST, ALT, ALKPHOS, BILITOT, PROT, ALBUMIN in the last 168 hours. No results for input(s): LIPASE, AMYLASE in the last 168 hours. No results for input(s): AMMONIA in the last 168 hours. Coagulation Profile: No results for input(s): INR, PROTIME in the last 168 hours. Cardiac Enzymes:  Recent Labs Lab 05/30/15 1050  TROPONINI <0.03   BNP (last 3 results) No results for input(s): PROBNP in the last 8760 hours. HbA1C: No results for input(s): HGBA1C in the last 72 hours. CBG: No results for input(s): GLUCAP in the last 168 hours. Lipid Profile: No results for input(s): CHOL, HDL, LDLCALC, TRIG, CHOLHDL, LDLDIRECT in the last 72 hours. Thyroid Function Tests: No results  for input(s): TSH, T4TOTAL, FREET4, T3FREE, THYROIDAB in the last 72 hours. Anemia Panel: No results for input(s): VITAMINB12, FOLATE, FERRITIN, TIBC, IRON, RETICCTPCT in the last 72 hours. Urine analysis:    Component Value Date/Time   COLORURINE YELLOW 02/17/2011 2236   APPEARANCEUR CLEAR 02/17/2011 2236   LABSPEC >1.030* 02/17/2011 2236   PHURINE 5.5 02/17/2011 2236   GLUCOSEU NEGATIVE 02/17/2011 2236   GLUCOSEU 100* 07/30/2006 0507   HGBUR TRACE* 02/17/2011 2236   BILIRUBINUR NEGATIVE 02/17/2011 2236   KETONESUR NEGATIVE 02/17/2011 2236   PROTEINUR NEGATIVE 02/17/2011 2236   UROBILINOGEN 0.2 02/17/2011 2236   NITRITE NEGATIVE 02/17/2011 2236   LEUKOCYTESUR NEGATIVE 02/17/2011 2236   Sepsis Labs: @LABRCNTIP (procalcitonin:4,lacticidven:4) )No results found for this or  any previous visit (from the past 240 hour(s)).   Radiological Exams on Admission: Dg Chest Port 1 View  05/30/2015  CLINICAL DATA:  New onset shortness of breath today. Slight cough. Initial encounter. EXAM: PORTABLE CHEST 1 VIEW COMPARISON:  One-view chest x-ray 09/21/2014 FINDINGS: The heart size is normal. Emphysematous changes are again noted. There is no edema or effusion to suggest failure. No focal airspace disease is present. The visualized soft tissues and bony thorax are unremarkable. IMPRESSION: 1. No acute cardiopulmonary disease or significant interval change. 2. Emphysema Electronically Signed   By: San Morelle M.D.   On: 05/30/2015 10:53    EKG: Independently reviewed. Sinus rhythm at a rate of 118, normal axis, no apparent acute ischemic changes  Assessment/Plan Principal Problem:   Acute respiratory failure (HCC) Active Problems:   COPD (chronic obstructive pulmonary disease) (HCC)   Hyperlipidemia   Essential hypertension   Acute respiratory failure with hypoxemia (HCC)    Acute hypoxemic respiratory failure -Due to COPD with acute exacerbation currently requiring noninvasive positive pressure ventilation.  COPD with acute exacerbation -Start IV steroids, frequent, empiric antibiotics, would benefit from addition of Spiriva upon discharge.  Hyperlipidemia  -continue statin  Hypertension -Well-controlled   DVT prophylaxis: Lovenox  Code Status: Full code  Family Communication: Patient only  Disposition Plan: Admit to stepdown unit, anticipate will be in the hospital for 2-3 days  Consults called: None  Admission status: Inpatient    Time Spent: 80 minutes  Lelon Frohlich MD [facility] Pager [phone]  If 7PM-7AM, please contact night-coverage [email or web address] Password [email or web address]  05/30/2015, 4:39 PM

## 2015-05-31 DIAGNOSIS — E785 Hyperlipidemia, unspecified: Secondary | ICD-10-CM

## 2015-05-31 DIAGNOSIS — I10 Essential (primary) hypertension: Secondary | ICD-10-CM

## 2015-05-31 DIAGNOSIS — J9601 Acute respiratory failure with hypoxia: Secondary | ICD-10-CM

## 2015-05-31 LAB — BASIC METABOLIC PANEL
ANION GAP: 8 (ref 5–15)
BUN: 16 mg/dL (ref 6–20)
CALCIUM: 9.3 mg/dL (ref 8.9–10.3)
CO2: 30 mmol/L (ref 22–32)
Chloride: 98 mmol/L — ABNORMAL LOW (ref 101–111)
Creatinine, Ser: 0.85 mg/dL (ref 0.61–1.24)
Glucose, Bld: 137 mg/dL — ABNORMAL HIGH (ref 65–99)
POTASSIUM: 4.4 mmol/L (ref 3.5–5.1)
Sodium: 136 mmol/L (ref 135–145)

## 2015-05-31 LAB — CBC
HEMATOCRIT: 40.1 % (ref 39.0–52.0)
HEMOGLOBIN: 12.9 g/dL — AB (ref 13.0–17.0)
MCH: 29.7 pg (ref 26.0–34.0)
MCHC: 32.2 g/dL (ref 30.0–36.0)
MCV: 92.4 fL (ref 78.0–100.0)
Platelets: 217 10*3/uL (ref 150–400)
RBC: 4.34 MIL/uL (ref 4.22–5.81)
RDW: 13.2 % (ref 11.5–15.5)
WBC: 4.5 10*3/uL (ref 4.0–10.5)

## 2015-05-31 LAB — TROPONIN I

## 2015-05-31 LAB — MRSA PCR SCREENING: MRSA BY PCR: NEGATIVE

## 2015-05-31 MED ORDER — METHYLPREDNISOLONE SODIUM SUCC 125 MG IJ SOLR
60.0000 mg | Freq: Three times a day (TID) | INTRAMUSCULAR | Status: DC
  Administered 2015-05-31 – 2015-06-01 (×3): 60 mg via INTRAVENOUS
  Filled 2015-05-31 (×3): qty 2

## 2015-05-31 NOTE — Care Management Note (Signed)
Case Management Note  Patient Details  Name: [name] [name]: [id number] Date of Birth: 1950-03-30  Subjective/Objective:                  Pt is from home, lives alone and is ind with ADL's. Pt has PCP and uses moped for transportation. Pt has neb machine but no home O2 prior to admission. Pt has no difficulty affording medications. Pt plans to return home with self care.   Action/Plan: No CM needs anticipated.   Expected Discharge Date:    06/04/2015              Expected Discharge Plan:  Home/Self Care  In-House Referral:  NA  Discharge planning Services  CM Consult  Post Acute Care Choice:  NA Choice offered to:  NA  DME Arranged:    DME Agency:     HH Arranged:    HH Agency:     Status of Service:  Completed, signed off  Medicare Important Message Given:  Yes Date Medicare IM Given:    Medicare IM give by:    Date Additional Medicare IM Given:    Additional Medicare Important Message give by:     If discussed at [place] of Stay Meetings, dates discussed:    Additional Comments:  Sherald Barge, RN 05/31/2015, 11:58 AM

## 2015-05-31 NOTE — Care Management Important Message (Signed)
Important Message  Patient Details  Name: [name] [name]: [id number] Date of Birth: 11-13-1950   Medicare Important Message Given:  Yes    Sherald Barge, RN 05/31/2015, 11:37 AM

## 2015-05-31 NOTE — Progress Notes (Signed)
PROGRESS NOTE    [name]  [id number] DOB: 07/22/1950 DOA: 05/30/2015 PCP: [name], MD   Brief Narrative:  65 y.o. male with history of COPD, hypertension, hyperlipidemia who presents to the hospital today with shortness of breath and wheezing that started on the morning of admission. Initially, patient was BiPAP dependent and was initially admitted to the stepdown unit.   Assessment & Plan:   Principal Problem:   Acute respiratory failure (HCC) Active Problems:   Hyperlipidemia   Essential hypertension   COPD (chronic obstructive pulmonary disease) (HCC)   Acute respiratory failure with hypoxemia (HCC)   Acute hypoxemic respiratory failure -Likely secondary to COPD with acute exacerbation  -Initially required noninvasive positive pressure ventilation. -Patient is continued on scheduled neb treatments and IV steroids -Clinically improving. We'll begin to wean steroids as tolerated.  COPD with acute exacerbation -Per above, patient is continued on scheduled nebs and IV steroids -Currently in process of weaning steroids as tolerated.  Hyperlipidemia  -continue statin  Hypertension -Well-controlled   DVT prophylaxis: Lovenox subcutaneous Code Status: Full Family Communication: Patient in room, family not at bedside Disposition Plan: Possible DC home in 24-48 hours   Consultants:     Procedures:     Antimicrobials:  Anti-infectives    Start     Dose/Rate Route Frequency Ordered Stop   05/30/15 1715  levofloxacin (LEVAQUIN) IVPB 750 mg     750 mg 100 mL/hr over 90 Minutes Intravenous Every 24 hours 05/30/15 1708            Subjective: Feels better today. Patient is asking about going home  Objective: Filed Vitals:   05/31/15 0900 05/31/15 1000 05/31/15 1429 05/31/15 1514  BP: 143/74 128/75    Pulse: 94 81    Temp:    97.6 F (36.4 C)  TempSrc:    Oral  Resp: 21 17    Height:      Weight:      SpO2: 99% 98% 98%      Intake/Output Summary (Last 24 hours) at 05/31/15 1600 Last data filed at 05/31/15 1443  Gross per 24 hour  Intake    870 ml  Output   2000 ml  Net  -1130 ml   Filed Weights   05/30/15 1007 05/30/15 1542 05/31/15 0500  Weight: 60.782 kg (134 lb) 57.8 kg (127 lb 6.8 oz) 59.9 kg (132 lb 0.9 oz)    Examination:  General exam: Appears calm and comfortable, Lying in bed  Respiratory system: Clear to auscultation. Respiratory effort normal. Cardiovascular system: S1 & S2 heard, RRR.  Gastrointestinal system: Abdomen is nondistended, soft and nontender. No organomegaly or masses felt. Normal bowel sounds heard. Central nervous system: Alert and oriented. No focal neurological deficits. Extremities: Symmetric 5 x 5 power. Skin: No rashes, lesions Psychiatry: Judgement and insight appear normal. Mood & affect appropriate.     Data Reviewed: I have personally reviewed following labs and imaging studies  CBC:  Recent Labs Lab 05/30/15 1050 05/31/15 0349  WBC 6.1 4.5  HGB 14.4 12.9*  HCT 44.2 40.1  MCV 93.6 92.4  PLT 203 [id number]   Basic Metabolic Panel:  Recent Labs Lab 05/30/15 1050 05/31/15 0349  NA 141 136  K 3.5 4.4  CL 101 98*  CO2 29 30  GLUCOSE 143* 137*  BUN 18 16  CREATININE 0.87 0.85  CALCIUM 9.3 9.3   GFR: Estimated Creatinine Clearance: 71.4 mL/min (by C-G formula based on Cr of 0.85). Liver Function Tests:  No results for input(s): AST, ALT, ALKPHOS, BILITOT, PROT, ALBUMIN in the last 168 hours. No results for input(s): LIPASE, AMYLASE in the last 168 hours. No results for input(s): AMMONIA in the last 168 hours. Coagulation Profile: No results for input(s): INR, PROTIME in the last 168 hours. Cardiac Enzymes:  Recent Labs Lab 05/30/15 1050 05/30/15 1658 05/30/15 2229 05/31/15 0349  TROPONINI <0.03 <0.03 <0.03 <0.03   BNP (last 3 results) No results for input(s): PROBNP in the last 8760 hours. HbA1C: No results for input(s): HGBA1C in the  last 72 hours. CBG: No results for input(s): GLUCAP in the last 168 hours. Lipid Profile: No results for input(s): CHOL, HDL, LDLCALC, TRIG, CHOLHDL, LDLDIRECT in the last 72 hours. Thyroid Function Tests: No results for input(s): TSH, T4TOTAL, FREET4, T3FREE, THYROIDAB in the last 72 hours. Anemia Panel: No results for input(s): VITAMINB12, FOLATE, FERRITIN, TIBC, IRON, RETICCTPCT in the last 72 hours. Sepsis Labs: No results for input(s): PROCALCITON, LATICACIDVEN in the last 168 hours.  Recent Results (from the past 240 hour(s))  MRSA PCR Screening     Status: None   Collection Time: 05/30/15  3:30 PM  Result Value Ref Range Status   MRSA by PCR NEGATIVE NEGATIVE Final    Comment:        The GeneXpert MRSA Assay (FDA approved for NASAL specimens only), is one component of a comprehensive MRSA colonization surveillance program. It is not intended to diagnose MRSA infection nor to guide or monitor treatment for MRSA infections.          Radiology Studies: Dg Chest Port 1 View  05/30/2015  CLINICAL DATA:  New onset shortness of breath today. Slight cough. Initial encounter. EXAM: PORTABLE CHEST 1 VIEW COMPARISON:  One-view chest x-ray 09/21/2014 FINDINGS: The heart size is normal. Emphysematous changes are again noted. There is no edema or effusion to suggest failure. No focal airspace disease is present. The visualized soft tissues and bony thorax are unremarkable. IMPRESSION: 1. No acute cardiopulmonary disease or significant interval change. 2. Emphysema Electronically Signed   By: San Morelle M.D.   On: 05/30/2015 10:53        Scheduled Meds: . aspirin  81 mg Oral Daily  . enoxaparin (LOVENOX) injection  40 mg Subcutaneous Q24H  . ipratropium  0.5 mg Nebulization Q6H  . levofloxacin (LEVAQUIN) IV  750 mg Intravenous Q24H  . methylPREDNISolone (SOLU-MEDROL) injection  60 mg Intravenous Q8H  . mometasone-formoterol  2 puff Inhalation BID  . multivitamin  with minerals  1 tablet Oral Daily  . pravastatin  40 mg Oral q1800  . sodium chloride flush  3 mL Intravenous Q12H  . triamterene-hydrochlorothiazide  1 tablet Oral Daily   Continuous Infusions:    LOS: 1 day    Suzie Vandam, Orpah Melter, MD [facility] Pager [phone]  If 7PM-7AM, please contact night-coverage [email or web address] Password [email or web address] 05/31/2015, 4:00 PM

## 2015-06-01 DIAGNOSIS — I10 Essential (primary) hypertension: Secondary | ICD-10-CM | POA: Diagnosis not present

## 2015-06-01 DIAGNOSIS — E785 Hyperlipidemia, unspecified: Secondary | ICD-10-CM | POA: Diagnosis not present

## 2015-06-01 DIAGNOSIS — J9601 Acute respiratory failure with hypoxia: Secondary | ICD-10-CM | POA: Diagnosis not present

## 2015-06-01 MED ORDER — PREDNISONE 5 MG PO TABS: 5.0000 mg | ORAL_TABLET | Freq: Every day | ORAL | Status: DC

## 2015-06-01 MED ORDER — LEVOFLOXACIN 750 MG PO TABS: 750.0000 mg | ORAL_TABLET | Freq: Every day | ORAL | Status: DC

## 2015-06-01 MED ORDER — METHYLPREDNISOLONE SODIUM SUCC 125 MG IJ SOLR
60.0000 mg | Freq: Two times a day (BID) | INTRAMUSCULAR | Status: DC
Start: 2015-06-01 — End: 2015-06-01

## 2015-06-01 NOTE — Care Management Note (Signed)
Case Management Note  Patient Details  Name: [name] [name]: [id number] Date of Birth: Mar 12, 1950  Expected Discharge Date:     06/01/2015             Expected Discharge Plan:  Home/Self Care  In-House Referral:  NA  Discharge planning Services  CM Consult  Post Acute Care Choice:  NA Choice offered to:  NA  DME Arranged:    DME Agency:     HH Arranged:    [place] Agency:     Status of Service:  Completed, signed off  Medicare Important Message Given:  Yes Date Medicare IM Given:    Medicare IM give by:    Date Additional Medicare IM Given:    Additional Medicare Important Message give by:     If discussed at [place] of Stay Meetings, dates discussed:    Additional Comments: Pt discharging home today. No CM needs.  Sherald Barge, RN 06/01/2015, 12:33 PM

## 2015-06-01 NOTE — Progress Notes (Signed)
Pt walked around unit on rm air - sats 95% no sob, at rest 96-97%, sitting in chair on rm air.

## 2015-06-01 NOTE — Discharge Summary (Signed)
Physician Discharge Summary  [name] [id number] DOB: [birth year] DOA: 05/30/2015  PCP: [name], MD  Admit date: 05/30/2015 Discharge date: 06/01/2015  Time spent: 20 minutes  Recommendations for Outpatient Follow-up:  1. Follow up with PCP in 2-3 weeks   Discharge Diagnoses:  Principal Problem:   Acute respiratory failure ([name]) Active Problems:   Hyperlipidemia   Essential hypertension   COPD (chronic obstructive pulmonary disease) ([name])   Acute respiratory failure with hypoxemia [facility])   Discharge Condition: Improved  Diet recommendation: Heart healthy  Filed Weights   05/30/15 1542 05/31/15 0500 06/01/15 0812  Weight: 57.8 kg (127 lb 6.8 oz) 59.9 kg (132 lb 0.9 oz) 58.3 kg (128 lb 8.5 oz)    History of present illness:  Please review dictated H and P from 5/23 for details. Briefly, 65 y.o. male with history of COPD, hypertension, hyperlipidemia who presents to the hospital today with shortness of breath and wheezing that started on the morning of admission. Initially, patient was BiPAP dependent and was initially admitted to the stepdown unit.  Hospital Course:  Acute hypoxemic respiratory failure -Likely secondary to COPD with acute exacerbation  -Initially required noninvasive positive pressure ventilation. -Patient was continued on scheduled neb treatments and IV steroids -Clinically improved -Patient's steroids were weaned and he is to complete prednisone taper on discharge -Patient ambulated on room air without difficulty  COPD with acute exacerbation -Per above, patient was continued on scheduled nebs and IV steroids while inpatient -Complete prednisone taper as outpatient  Hyperlipidemia  -continued statin  Hypertension -Well-controlled  Discharge Exam: Filed Vitals:   06/01/15 0926 06/01/15 0928 06/01/15 1000 06/01/15 1025  BP:      Pulse:      Temp:      TempSrc:      Resp:      Height:      Weight:      SpO2: 98% 98% 95% 95%     General: awake, in nad Cardiovascular: regular, s1, s2 Respiratory: normal resp effort, no wheezing  Discharge Instructions     Medication List    STOP taking these medications        amLODipine 2.5 MG tablet  Commonly known as:  NORVASC      TAKE these medications        ADVAIR DISKUS 500-50 MCG/DOSE Aepb  Generic drug:  Fluticasone-Salmeterol  INHALE 1 PUFF BY MOUTH TWICE A DAY. RINSE MOUTH AFTER USE.     aspirin 81 MG tablet  Take 81 mg by mouth daily.     COMBIVENT RESPIMAT 20-100 MCG/ACT Aers respimat  Generic drug:  Ipratropium-Albuterol  Take 1 puff by mouth every 6 (six) hours as needed.     ipratropium 0.02 % nebulizer solution  Commonly known as:  ATROVENT  INHALE 1 VIAL VIA NEBULIZER EVERY 6-8 HOURS FOR WHEEZING.     levofloxacin 750 MG tablet  Commonly known as:  LEVAQUIN  Take 1 tablet (750 mg total) by mouth daily at 6 PM.     multivitamin tablet  Take 1 tablet by mouth daily.     pravastatin 40 MG tablet  Commonly known as:  PRAVACHOL  TAKE (1) TABLET BY MOUTH AT BEDTIME.     predniSONE 5 MG tablet  Commonly known as:  DELTASONE  Take 1 tablet (5 mg total) by mouth daily with breakfast.     triamterene-hydrochlorothiazide 37.5-25 MG tablet  Commonly known as:  MAXZIDE-25  TAKE ONE TABLET BY MOUTH ONCE DAILY.  VENTOLIN HFA 108 (90 Base) MCG/ACT inhaler  Generic drug:  albuterol  INHALE 2 PUFFS EVERY 4 HOURS AS NEEDED.     albuterol (2.5 MG/3ML) 0.083% nebulizer solution  Commonly known as:  PROVENTIL  TAKE 1 AMPULE (3 MLS) BY NEBULIZATION AS DIRECTED EVERY 4 HOURS AS NEEDED.       No Known Allergies     Follow-up Information    Follow up with [name], MD. Schedule an appointment as soon as possible for a visit in 2 weeks.   Specialty:  Family Medicine   Why:  Hospital follow up   Contact information:   [address] [name] [address] [phone]        The results of significant diagnostics  from this hospitalization (including imaging, microbiology, ancillary and laboratory) are listed below for reference.    Significant Diagnostic Studies: Dg Chest Port 1 View  05/30/2015  CLINICAL DATA:  New onset shortness of breath today. Slight cough. Initial encounter. EXAM: PORTABLE CHEST 1 VIEW COMPARISON:  One-view chest x-ray 09/21/2014 FINDINGS: The heart size is normal. Emphysematous changes are again noted. There is no edema or effusion to suggest failure. No focal airspace disease is present. The visualized soft tissues and bony thorax are unremarkable. IMPRESSION: 1. No acute cardiopulmonary disease or significant interval change. 2. Emphysema Electronically Signed   By: San Morelle M.D.   On: 05/30/2015 10:53    Microbiology: Recent Results (from the past 240 hour(s))  MRSA PCR Screening     Status: None   Collection Time: 05/30/15  3:30 PM  Result Value Ref Range Status   MRSA by PCR NEGATIVE NEGATIVE Final    Comment:        The GeneXpert MRSA Assay (FDA approved for NASAL specimens only), is one component of a comprehensive MRSA colonization surveillance program. It is not intended to diagnose MRSA infection nor to guide or monitor treatment for MRSA infections.      Labs: Basic Metabolic Panel:  Recent Labs Lab 05/30/15 1050 05/31/15 0349  NA 141 136  K 3.5 4.4  CL 101 98*  CO2 29 30  GLUCOSE 143* 137*  BUN 18 16  CREATININE 0.87 0.85  CALCIUM 9.3 9.3   Liver Function Tests: No results for input(s): AST, ALT, ALKPHOS, BILITOT, PROT, ALBUMIN in the last 168 hours. No results for input(s): LIPASE, AMYLASE in the last 168 hours. No results for input(s): AMMONIA in the last 168 hours. CBC:  Recent Labs Lab 05/30/15 1050 05/31/15 0349  WBC 6.1 4.5  HGB 14.4 12.9*  HCT 44.2 40.1  MCV 93.6 92.4  PLT 203 217   Cardiac Enzymes:  Recent Labs Lab 05/30/15 1050 05/30/15 1658 05/30/15 2229 05/31/15 0349  TROPONINI <0.03 <0.03 <0.03 <0.03    BNP: BNP (last 3 results)  Recent Labs  05/30/15 1050  BNP 8.0    ProBNP (last 3 results) No results for input(s): PROBNP in the last 8760 hours.  CBG: No results for input(s): GLUCAP in the last 168 hours.     Signed:  Marcelis Wissner K  [facility] 06/01/2015, 11:50 AM

## 2015-06-01 NOTE — Progress Notes (Signed)
Pharmacy Antibiotic Note  [name] is a 65 y.o. male admitted on 05/30/2015 with pneumonia / COPD EXACERBATION.   Pharmacy has been consulted for levaquin dosing.  Height: 5\' 7"  (170.2 cm) Weight: 128 lb 8.5 oz (58.3 kg) IBW/kg (Calculated) : 66.1  Temp (24hrs), Avg:97.9 F (36.6 C), Min:97.4 F (36.3 C), Max:98.9 F (37.2 C)   Recent Labs Lab 05/30/15 1050 05/31/15 0349  WBC 6.1 4.5  CREATININE 0.87 0.85    Estimated Creatinine Clearance: 69.5 mL/min (by C-G formula based on Cr of 0.85).    No Known Allergies  Antimicrobials this admission: 5/23 levaquin >>   PHARMACIST - PHYSICIAN COMMUNICATION CONCERNING: Antibiotic IV to Oral Route Change Policy  RECOMMENDATION: This patient is receiving LEVAQUIN by the intravenous route.  Based on criteria approved by the Pharmacy and Therapeutics Committee, the antibiotic(s) is/are being converted to the equivalent oral dose form(s).  DESCRIPTION: These criteria include:  Patient being treated for a respiratory tract infection, urinary tract infection, cellulitis or clostridium difficile associated diarrhea if on metronidazole  The patient is not neutropenic and does not exhibit a GI malabsorption state  The patient is eating (either orally or via tube) and/or has been taking other orally administered medications for a least 24 hours  The patient is improving clinically and has a Tmax < 100.5  If you have questions about this conversion, please contact the Pharmacy Department  [x]   [phone] )  [name] []   [phone] )  [facility] []   [phone] )  [facility] []   [phone] )  [facility] []   [phone] )  [facility]    Thank you for allowing pharmacy to be a part of this patient's care.  Hart Robinsons A 06/01/2015 11:01 AM

## 2015-06-01 NOTE — Progress Notes (Signed)
Pt  being discharged home, d/c instructions/education given, all questions and concerns answered. Belongings with patient. D/C via wheelchair.

## 2015-06-07 ENCOUNTER — Other Ambulatory Visit: Payer: Self-pay | Admitting: *Deleted

## 2015-06-07 DIAGNOSIS — J449 Chronic obstructive pulmonary disease, unspecified: Secondary | ICD-10-CM

## 2015-06-07 NOTE — Patient Outreach (Signed)
[facility]) Care Management  06/07/2015  RUFE RUTKOWSKI October 23, 1950 [id number]   Referral from [place] hospital admission  5/23-5/25/2017 for acute respiratory failure, COPD exacerbation. PMH: CVA, COPD,HTN, Asthma  Received incoming call from patient who was responding to RN CM voice mail request. Patient was advised of referral and [facility] care management services. HIPPA verification received from patient.    Patient voices that he lives alone and cares for himself. States he was recently hospitalized at [facility] for COPD. States he had asthma as a child and now has COPD. States he is taking medications and using nebulizer as ordered by his doctor. Patient states no difficulty getting new prescriptions filled since discharge from hospital. [place] and pharmacist explains to him how to take medications. States they also set up medications in daily doses for him. Patient advises that he does not read well.   Patient voices that he has follow up appointment scheduled with primary care -Dr. Moshe Cipro next week .States he plans to ride his moped to appointment which is 1 block from where he lives. States he does not have driver's license and no family or friends to take him. Patient is aware of "[employer]" but chooses not to use community transportation. States he called them in the past and no one showed up so he does not care to use them..    Patient advised of importance of Md follow up, taking medications and managing chronic medical conditions following hospital stay.  He consents to [facility] care management services.   Plan: referral to care management assistant to assign to [facility] care coordinator  for complex case management following hospital stay.   Sherrin Daisy, RN BSN [place] Management Coordinator [facility] Care Management  [phone]

## 2015-06-07 NOTE — Patient Outreach (Signed)
[facility]) Care Management  06/07/2015  [name] [birth year] [id number]  Referral per [facility] (recent hospital stay from 05/23-05/25/2017; Dxs acute respiratory failure, COPD exacerbation).  Telephone call to patient; left message on voice mail requesting return call.  Plan: Will follow up.  Sherrin Daisy, RN BSN [place] Management Coordinator [facility] Care Management  [phone]

## 2015-06-08 ENCOUNTER — Ambulatory Visit: Payer: Self-pay | Admitting: *Deleted

## 2015-06-09 ENCOUNTER — Encounter: Payer: Self-pay | Admitting: *Deleted

## 2015-06-09 ENCOUNTER — Other Ambulatory Visit: Payer: Self-pay | Admitting: *Deleted

## 2015-06-09 DIAGNOSIS — J441 Chronic obstructive pulmonary disease with (acute) exacerbation: Secondary | ICD-10-CM

## 2015-06-09 NOTE — Patient Outreach (Signed)
[facility]) Care Management  06/09/2015  HAYDEN GERA [birth year] [id number]  Mr. Becky Mckinzie is a patient familiar to [place] Management from a previous referral. Mr. Rudis is now referred to [place] by [facility] after he was recently hospitalized (5/23-5/25/2017) for acute respiratory failure, COPD exacerbation. PMH: CVA, COPD,HTN, Asthma  Medication Management: Mr. Grass confirms that he has received all prescribed medications [facility]) since his hospitalization and uses a prefilled blister pack (likely through [facility]). He is very knowledgeable about and says he is using his nebulizer as prescribed.   Literacy: Patient advises that he does not read well.   Medical Follow Up - Mr. Decleene has an appointment scheduled with his primary care physician, Dr. [name] on 06/19/15. Mr. Nash will ride his moped to appointment which is 1 block from where he lives. States he does not have driver's license and no family or friends to take him. Patient is aware of "[employer]" but chooses not to use community transportation. States he called them in the past and no one showed up so he does not care to use them..   Plan: I will refer Mr. Skahan to our social work team for assistance with transportation resources and social isolation.   I will reach out to Mr. Bonnici again in 2 weeks to follow up on his appointment with Dr. Moshe Cipro and will ask at that time if I can schedule an in-home/face to face visit for more in depth assessment.    [place] Management  [phone]

## 2015-06-15 ENCOUNTER — Encounter: Payer: Self-pay | Admitting: Licensed Clinical Social Worker

## 2015-06-15 ENCOUNTER — Other Ambulatory Visit: Payer: Self-pay | Admitting: Licensed Clinical Social Worker

## 2015-06-15 NOTE — Patient Outreach (Signed)
Assessment:  CSW received referral on [name]. CSW completed chart review on client on 06/15/15.  Client sees Dr. Moshe Cipro as primary care doctor. Client receives [place] support with RN [name]. CSW received referral on client from [place] related to transportation needs of client. Client has previously used a moped to travel short distances in the community. He lives in a residence near downtown area of [place], [place] and moped transport is his preferred way to go to and from nearby appointments. RN [name] has shared with client information about [place].  CSW called client on 06/15/15 and spoke via phone with client on 06/15/15. CSW verified client identity. CSW and client spoke of client needs. CSW and client reviewed [facility] assessment information. Client does live alone with little family support. He does try to attend scheduled medical appointments. [name] reported that he had his prescribed medications and is taking medications as prescribed.  He said he has a nebulizer and takes nebulizer treatments as prescribed. He said he does not use oxygen in the home. He said he used to smoke but now he no longer smokes. CSW and client spoke of client care plan. CSW encouraged J[place] to communicate with CSW in next 30 days as needed to discuss transportation resources for client in the area. CSW talked with [name] about [place] and adding client's name to transport assistance list for that agency. Client said he still was using his moped for transport to and from local appointments/errands. CSW encouraged client to add his name to [place] list for transport assistance. Client is thinking about this option.  CSW also talked with [name] about [facility] program support in nursing and pharmacy programs.CSW encouraged [name] to communicate with RN [name] as needed to discuss nursing needs of client. CSW  gave client CSW phone number of [phone] and encouraged [name] to call CSW as needed to discuss social work needs of client. CSW thanked [name] for phone conversation with CSW on 06/15/15.  [name] said he looked forward to speaking with RN [name] to discuss nursing needs of client.      Plan:  Client to communicate with CSW in next 30 days to discuss transportation resources for client in the area. CSW to call client in two weeks to assess client needs. CSW to collaborate with RN [name] in monitoring needs of client.  Norva Riffle.Nakeem Murnane MSW, LCSW Licensed Clinical Social Worker [facility] Care Management [phone]

## 2015-06-19 ENCOUNTER — Encounter: Payer: Self-pay | Admitting: Family Medicine

## 2015-06-19 ENCOUNTER — Ambulatory Visit (INDEPENDENT_AMBULATORY_CARE_PROVIDER_SITE_OTHER): Payer: PPO | Admitting: Family Medicine

## 2015-06-19 VITALS — BP 120/74 | HR 83 | Resp 16 | Ht 67.0 in | Wt 136.0 lb

## 2015-06-19 DIAGNOSIS — R7302 Impaired glucose tolerance (oral): Secondary | ICD-10-CM

## 2015-06-19 DIAGNOSIS — I10 Essential (primary) hypertension: Secondary | ICD-10-CM | POA: Diagnosis not present

## 2015-06-19 DIAGNOSIS — E785 Hyperlipidemia, unspecified: Secondary | ICD-10-CM | POA: Diagnosis not present

## 2015-06-19 DIAGNOSIS — Z23 Encounter for immunization: Secondary | ICD-10-CM

## 2015-06-19 DIAGNOSIS — Z09 Encounter for follow-up examination after completed treatment for conditions other than malignant neoplasm: Secondary | ICD-10-CM | POA: Diagnosis not present

## 2015-06-19 DIAGNOSIS — J449 Chronic obstructive pulmonary disease, unspecified: Secondary | ICD-10-CM

## 2015-06-19 MED ORDER — PRAVASTATIN SODIUM 40 MG PO TABS: ORAL_TABLET | ORAL | Status: DC

## 2015-06-19 NOTE — Assessment & Plan Note (Signed)
Patient educated about the importance of limiting  Carbohydrate intake , the need to commit to daily physical activity for a minimum of 30 minutes , and to commit weight loss. The fact that changes in all these areas will reduce or eliminate all together the development of diabetes is stressed.  Updated lab needed at/ before next visit.   Diabetic Labs Latest Ref Rng 05/31/2015 05/30/2015 03/16/2015 09/21/2014 08/23/2014  HbA1c <5.7 % - - 6.5(H) - 6.3(H)  Chol 125 - 200 mg/dL - - 197 - 156  HDL >=40 mg/dL - - 82 - 65  Calc LDL <130 mg/dL - - 102 - 79  Triglycerides <150 mg/dL - - 65 - 61  Creatinine 0.61 - 1.24 mg/dL 0.85 0.87 1.00 0.98 0.97   BP/Weight 06/19/2015 06/01/2015 03/24/2015 01/04/2015 09/21/2014 08/04/2014 [id number]  Systolic BP 123456 Q000111Q 123XX123 A999333 Q000111Q A999333 Q000111Q  Diastolic BP 74 82 84 90 87 80 80  Wt. (Lbs) 136 128.53 148 138.04 140 135.8 146.12  BMI 21.3 20.13 23.9 22.29 21.92 21.93 22.88   No flowsheet data found.

## 2015-06-19 NOTE — Assessment & Plan Note (Signed)
Currently stable and controlled , no med change

## 2015-06-19 NOTE — Progress Notes (Signed)
Subjective:    Patient ID: [name], male    DOB: 03-Apr-1950, 65 y.o.   [name]: [id number]  HPI Patient in for follow up of recent hospitalization. Discharge summary, and laboratory and radiology data are reviewed, and any questions or concerns about recent hospitalization are discussed. Specific issues requiring follow up are specifically addressed.    Review of Systems See HPI Denies recent fever or chills. Denies sinus pressure, nasal congestion, ear pain or sore throat. Denies chest congestion, productive cough , does have occasional wheezing, but much improved Denies abdominal pain, nausea, vomiting,diarrhea or constipation.   Denies dysuria, frequency, hesitancy or incontinence. Denies joint pain, swelling and limitation in mobility. Denies headaches, seizures, numbness, or tingling. Denies depression, anxiety or insomnia. Denies skin break down or rash.        Objective:   Physical Exam BP 120/74 mmHg  Pulse 83  Resp 16  Ht 5\' 7"  (1.702 m)  Wt 136 lb (61.689 kg)  BMI 21.30 kg/m2  SpO2 95%  Patient alert and oriented and in no cardiopulmonary distress.  HEENT: No facial asymmetry, EOMI,   oropharynx pink and moist.  Neck supple no JVD, no mass.  Chest: Decreased though adequate air entry, few high pitched wheezes bilaterally  CVS: S1, S2 no murmurs, no S3.Regular rate.  ABD: Soft non tender.   Ext: No edema  MS: Adequate ROM spine, shoulders, hips and knees.  Skin: Intact, no ulcerations or rash noted.  Psych: Good eye contact, normal affect. Memory intact not anxious or depressed appearing.  CNS: CN 2-12 intact, power,  normal throughout.no focal deficits noted.        Assessment & Plan:  Essential hypertension Controlled off of am;lodipine, discontinue amlodipine DASH diet and commitment to daily physical activity for a minimum of 30 minutes discussed and encouraged, as a part of hypertension management. The importance of attaining a  healthy weight is also discussed.  BP/Weight 06/19/2015 06/01/2015 03/24/2015 01/04/2015 09/21/2014 08/04/2014 [id number]  Systolic BP 123456 Q000111Q 123XX123 A999333 Q000111Q A999333 Q000111Q  Diastolic BP 74 82 84 90 87 80 80  Wt. (Lbs) 136 128.53 148 138.04 140 135.8 146.12  BMI 21.3 20.13 23.9 22.29 21.92 21.93 22.88        Hospital discharge follow-up Improved, npo current wheeze or dyspnea, continue maintainance meds  Hyperlipidemia Hyperlipidemia:Low fat diet discussed and encouraged.   Lipid Panel  Lab Results  Component Value Date   CHOL 197 03/16/2015   HDL 82 03/16/2015   LDLCALC 102 03/16/2015   TRIG 65 03/16/2015   CHOLHDL 2.4 03/16/2015   Controlled, no change in medication     IGT (impaired glucose tolerance) Patient educated about the importance of limiting  Carbohydrate intake , the need to commit to daily physical activity for a minimum of 30 minutes , and to commit weight loss. The fact that changes in all these areas will reduce or eliminate all together the development of diabetes is stressed.  Updated lab needed at/ before next visit.   Diabetic Labs Latest Ref Rng 05/31/2015 05/30/2015 03/16/2015 09/21/2014 08/23/2014  HbA1c <5.7 % - - 6.5(H) - 6.3(H)  Chol 125 - 200 mg/dL - - 197 - 156  HDL >=40 mg/dL - - 82 - 65  Calc LDL <130 mg/dL - - 102 - 79  Triglycerides <150 mg/dL - - 65 - 61  Creatinine 0.61 - 1.24 mg/dL 0.85 0.87 1.00 0.98 0.97   BP/Weight 06/19/2015 06/01/2015 03/24/2015 01/04/2015 09/21/2014 08/04/2014 [id number]  Systolic BP  120 133 126 142 Q000111Q A999333 Q000111Q  Diastolic BP 74 82 84 90 87 80 80  Wt. (Lbs) 136 128.53 148 138.04 140 135.8 146.12  BMI 21.3 20.13 23.9 22.29 21.92 21.93 22.88   No flowsheet data found.     COPD (chronic obstructive pulmonary disease) (HCC) Currently stable and controlled , no med change

## 2015-06-19 NOTE — Assessment & Plan Note (Signed)
Hyperlipidemia:Low fat diet discussed and encouraged.   Lipid Panel  Lab Results  Component Value Date   CHOL 197 03/16/2015   HDL 82 03/16/2015   LDLCALC 102 03/16/2015   TRIG 65 03/16/2015   CHOLHDL 2.4 03/16/2015   Controlled, no change in medication

## 2015-06-19 NOTE — Patient Instructions (Signed)
Annual wellness as before, call if you need me sooner  Thankful you are doing better  Excellent blood pressure, no more amlodipine, we will also let pharmacy know   Pneumonia vaccine today  Thank you  for choosing [facility]. We consider it a privelige to serve you.  Delivering excellent health care in a caring and  compassionate way is our goal.  Partnering with you,  so that together we can achieve this goal is our strategy.

## 2015-06-19 NOTE — Assessment & Plan Note (Signed)
Improved, npo current wheeze or dyspnea, continue maintainance meds

## 2015-06-19 NOTE — Assessment & Plan Note (Signed)
Controlled off of am;lodipine, discontinue amlodipine DASH diet and commitment to daily physical activity for a minimum of 30 minutes discussed and encouraged, as a part of hypertension management. The importance of attaining a healthy weight is also discussed.  BP/Weight 06/19/2015 06/01/2015 03/24/2015 01/04/2015 09/21/2014 08/04/2014 [id number]  Systolic BP 123456 Q000111Q 123XX123 A999333 Q000111Q A999333 Q000111Q  Diastolic BP 74 82 84 90 87 80 80  Wt. (Lbs) 136 128.53 148 138.04 140 135.8 146.12  BMI 21.3 20.13 23.9 22.29 21.92 21.93 22.88

## 2015-06-20 ENCOUNTER — Other Ambulatory Visit: Payer: Self-pay | Admitting: *Deleted

## 2015-06-20 NOTE — Patient Outreach (Signed)
[place] [facility] [facility]) Care Management  06/20/2015  TAVYN FLUITT 1950/11/03 [id number]   Unable to reach Mr. Bonde at home by phone today.   Mr. Weaver has been working with [employer] re: transportation and social isolation concerns. I would like to meet with Mr. Skuse at home if he is agreeable so that we can discuss chronic disease management concerns (DM, HTN).   Plan: I will reach out to Mr. Heinzmann again on Friday by phone.    [place] Management  [phone]

## 2015-06-23 ENCOUNTER — Ambulatory Visit: Payer: Self-pay | Admitting: *Deleted

## 2015-06-27 ENCOUNTER — Other Ambulatory Visit: Payer: Self-pay | Admitting: *Deleted

## 2015-06-27 ENCOUNTER — Encounter: Payer: Self-pay | Admitting: *Deleted

## 2015-06-27 NOTE — Patient Outreach (Signed)
[facility]) Care Management  06/27/2015  GURNOOR PILGRAM 15-Jun-1950 [id number]  Mr. Mancel Kintzel is a patient familiar to [place] Management from a previous referral. Mr. Necessary was referred to [place] by [facility] after he was recently hospitalized (5/23-5/25/2017) for acute respiratory failure, COPD exacerbation. PMH: CVA, COPD,HTN, Asthma.   Literacy: Patient advises that he does not read well.   Medical Follow Up - Mr. Quertermous saw his primary care physician, Dr. [name] on 06/19/15.   HTN: Dr. Moshe Cipro discontinued Mr. Linstrom amlodipine. I discussed this with him today and he is taking his medications as prescribed   COPD: per Dr. Griffin Dakin note, stable. Mr. Rolnick is knowledgeable about his COPD medications, is taking them as directed, and by his own report is feeling well.   Hyperlipidemia: taking medications as prescribed  DM: elevated HgA1C; I reviewed DM education needs with him and we will follow up on this during our face to face appointment   Transportation concerns: Mr. Allston rides his moped wherever he needs to go. He does not have driver's license and no family or friends to take him, according to his report. Patient is aware of "[employer]" but has choosen not to use them for community transportation after he felt he had a bad experience with them in the past. I referred him to our social worker Scott Forrest LCSW. Mr. Shea Evans has been working with Mr. Lindy    Plan: Mr. Kepford has agreed for me to see him at home for a face to face visit so that we can discuss his chronic health needs. I will see him at home on  Thursday 06/29/15 @ 11:30am.    [place] Management  [phone]

## 2015-06-29 ENCOUNTER — Other Ambulatory Visit: Payer: Self-pay | Admitting: Licensed Clinical Social Worker

## 2015-06-29 ENCOUNTER — Other Ambulatory Visit: Payer: Self-pay | Admitting: *Deleted

## 2015-06-29 NOTE — Patient Outreach (Signed)
[facility]) Care Management   06/29/2015  MASLAH GUCCIARDO 1950-05-29 [id number]  PAU ZELDIN is an 65 y.o. male who is a patient familiar to [place] Management from a previous referral. Mr. Klemm was referred to [place] by [facility] after he was recently hospitalized (5/23-5/25/2017) for acute respiratory failure, COPD exacerbation. PMH: CVA, COPD,HTN, Asthma.   I am visiting Mr. Garthwaite at home today for initial face to face visit to address chronic health management concerns: HTN, COPD, Hyperlipidemia, DM.   Subjective: "I think I'm doing pretty good. I just need some helping getting around town and getting my yard took care of."  Objective:  BP 158/80 mmHg  Pulse 89  SpO2 95%  Review of Systems  Constitutional: Negative.   HENT: Negative.   Eyes: Negative.   Respiratory: Negative.  Negative for sputum production, shortness of breath and wheezing.   Cardiovascular: Negative.   Gastrointestinal: Negative.   Genitourinary: Negative.   Musculoskeletal: Negative for falls.  Skin: Negative.   Neurological: Negative.   Psychiatric/Behavioral: Negative.        Social isolation    Physical Exam  Constitutional: He is oriented to person, place, and time. Vital signs are normal. He appears well-developed and well-nourished. He is active. He does not have a sickly appearance. He does not appear ill.  Cardiovascular: Normal rate and regular rhythm.   Respiratory: Effort normal. No respiratory distress. He has decreased breath sounds in the right lower field and the left lower field. He has no wheezes. He has no rhonchi. He has no rales.  GI: Soft. Bowel sounds are normal.  Neurological: He is alert and oriented to person, place, and time.  Skin: Skin is warm, dry and intact.  Psychiatric: He has a normal mood and affect. His speech is normal and behavior is normal. Judgment and thought content normal. Cognition and memory are normal.  Pleasant, smiling, conversant     Encounter Medications:   Outpatient Encounter Prescriptions as of 06/29/2015  Medication Sig Note  . ADVAIR DISKUS 500-50 MCG/DOSE AEPB INHALE 1 PUFF BY MOUTH TWICE A DAY. RINSE MOUTH AFTER USE.   [name] Kitchen albuterol (PROVENTIL) (2.5 MG/3ML) 0.083% nebulizer solution TAKE 1 AMPULE (3 MLS) BY NEBULIZATION AS DIRECTED EVERY 4 HOURS AS NEEDED.   [name] Kitchen aspirin 81 MG tablet Take 81 mg by mouth daily.   . COMBIVENT RESPIMAT 20-100 MCG/ACT AERS respimat Take 1 puff by mouth every 6 (six) hours as needed.  05/30/2015: Received from: External Pharmacy  . ipratropium (ATROVENT) 0.02 % nebulizer solution INHALE 1 VIAL VIA NEBULIZER EVERY 6-8 HOURS FOR WHEEZING.   . Multiple Vitamin (MULTIVITAMIN) tablet Take 1 tablet by mouth daily.   . pravastatin (PRAVACHOL) 40 MG tablet TAKE (1) TABLET BY MOUTH AT BEDTIME.   [name] Kitchen triamterene-hydrochlorothiazide (MAXZIDE-25) 37.5-25 MG tablet TAKE ONE TABLET BY MOUTH ONCE DAILY.   . VENTOLIN HFA 108 (90 Base) MCG/ACT inhaler INHALE 2 PUFFS EVERY 4 HOURS AS NEEDED.    No facility-administered encounter medications on file as of 06/29/2015.    Functional Status:   In your present state of health, do you have any difficulty performing the following activities: 06/15/2015 06/09/2015  Hearing? N -  Vision? N -  Difficulty concentrating or making decisions? Y -  Walking or climbing stairs? N -  Dressing or bathing? N -  Doing errands, shopping? N -  Preparing Food and eating ? N N  Using the Toilet? N N  In the past six months, have  you accidently leaked urine? N N  Do you have problems with loss of bowel control? N N  Managing your Medications? N N  Managing your Finances? Tempie Donning  Housekeeping or managing your Housekeeping? Y N    Fall/Depression Screening:    PHQ 2/9 Scores 06/15/2015 06/07/2015 11/04/2014 08/08/2014 06/16/2013 07/23/2012 07/23/2012  PHQ - 2 Score 2 1 2 2 6  0 0  PHQ- 9 Score 5 - 5 5 12  - -    Assessment: Mr. Yamamoto is a 65 year old gentleman living alone in  [place]. He is very engaged and conversant today. Mr. Karstetter is knowledgeable about his disease processes despite his limited literacy. Mr. Eichholz has had 5 ED visits and 1 hospital admission in the last 4 1/2 years. He manages his medical conditions pretty well.   Mr. Rinne is socially isolated but gets out of the house almost daily. He lives within walking distance of [facility] and the office of Dr. [name], his primary care provider. He drives his moped to [employer] for household needs and food when he has money. Mr. An is being followed by our LCSW for assistance with issues related to social isolation and transportation.   Chronic Health Condition (HTN): Dr. Moshe Cipro discontinued Mr. Mutti amlodipine. I discussed this with him today and he is taking his medications as prescribed   Chronic Health Condition (COPD): per Dr. Griffin Dakin note, stable. Mr. Braatz is knowledgeable about his COPD medications, is taking them as directed, and by his own report is feeling well. His breath sounds are clear although diminished. He is not a smoker.  Mr. Gudmundson says the most limiting thing about his COPD is shortness of breath that keeps him from being able to do his weed eating. He mows his yard on a riding mower. I reached out to [facility] who in turn is going to have their local representative reach out to Mr. Vanorden to help with his yard work for the summer.    Chronic Health Condition (Hyperlipidemia): taking medications as prescribed; has some difficulty adhering to prescribed heart healthy diet because of limited transportation and resources; LCSW working with patient on community resources  Chronic Health Condition (DM): elevated HgA1C on 03/16/15 @ 6.5 (increased from 6.3 three months earlier), mean plasma glucose at that time = 140; I reviewed DM education needs with him; as noted, he has difficulty with dietary adherence secondary to social isolation, limited  transportation, and limited resources. At one time, he was using nutrition shakes but says he is not that fond of them and prefers to eat "real food".   Transportation concerns: Mr. Strohmaier rides his moped wherever he needs to go. He does not have driver's license and no family or friends to take him, according to his report. Patient is aware of "[facility]" but has choosen not to use them for community transportation after he felt he had a bad experience with them in the past. I referred him to our social worker Scott Forrest LCSW. Mr. Shea Evans has been working with Mr. Schubbe. Mr. Jha states he has talked with Mr. Shea Evans about [employer] and other transportation options but still does not understand what he needs to do to actually get help with transportation. I will discuss further with Mr. Shea Evans.   Plan:  Mr. Dionne says he does not feel he needs regular nursing visits. He agrees to let me call him again next month to follow up on his progress. I will  plan to reach out to him again in July. If he continues to do well, I will discharge him from nursing case management services but be available should any needs arise in the future. I will notify [name] LCSW of my visit with Mr. Yazzie today.    [place] Management  [phone]

## 2015-06-29 NOTE — Patient Outreach (Signed)
Assessment:  CSW spoke via phone with client on 06/29/15. CSW verified client identity. [name] spoke of his current needs. He said he has prescribed medications and is taking medications as prescribed. He sees Dr. Moshe Cipro as primary care doctor. He said he has nebulizer and takes nebulizer treatments as prescribed. He said he is eating adequately and sleeping adequately.  He receives [facility] nursing support with RN [name]. He said he does not use oxygen in the home. He has a moped that he often uses for transport assistance. CSW again reminded [name] of Aging, Disability and Transient Services in [place] in regards to transportation needs of client. CSW talked with [name] about client adding his name to transport list with that agency.  CSW talked with [name] about typical costs of transport assist with that aging within the [place], [place] area.  Client said he sometimes is fatigued at home and has to sit down and rest. He said his appliances in kitchen work well he just has trouble standing very long or fatigues after standing a while. He is trying to attend medical appointments as scheduled. CSW encouraged client to call CSW as needed to discuss social work needs of client. CSW encouraged client to call RN [name] as needed to discuss nursing needs of client. CSW and [name] spoke of client care plan. CSW encouraged [name] to communicate as needed with CSW in next 30 days to discuss transport resources of assistance for client in the area.  CSW thanked [name] for phone conversation with CSW on 06/29/15   Plan:  Client to communicate with CSW in next 30 days to discuss transportation resources for client in the area. CSW to call client in 4 weeks to assess client needs at that time. CSW to collaborate as needed with RN [name] in monitoring needs of client.  [name].Lianah Peed MSW, LCSW Licensed Clinical Social Worker [facility] Care Management [phone]

## 2015-07-05 ENCOUNTER — Other Ambulatory Visit: Payer: Self-pay | Admitting: Licensed Clinical Social Worker

## 2015-07-05 ENCOUNTER — Encounter: Payer: Self-pay | Admitting: *Deleted

## 2015-07-05 NOTE — Progress Notes (Signed)
This encounter was created in error - please disregard.

## 2015-07-05 NOTE — Patient Outreach (Signed)
[facility]) Care Management  [facility] Social Work  07/05/2015  [name] 1950/01/11 [id number]  Subjective:    Objective:   Encounter Medications:  Outpatient Encounter Prescriptions as of 07/05/2015  Medication Sig Note  . ADVAIR DISKUS 500-50 MCG/DOSE AEPB INHALE 1 PUFF BY MOUTH TWICE A DAY. RINSE MOUTH AFTER USE.   [name] Kitchen albuterol (PROVENTIL) (2.5 MG/3ML) 0.083% nebulizer solution TAKE 1 AMPULE (3 MLS) BY NEBULIZATION AS DIRECTED EVERY 4 HOURS AS NEEDED.   [name] Kitchen aspirin 81 MG tablet Take 81 mg by mouth daily.   . COMBIVENT RESPIMAT 20-100 MCG/ACT AERS respimat Take 1 puff by mouth every 6 (six) hours as needed.  05/30/2015: Received from: External Pharmacy  . ipratropium (ATROVENT) 0.02 % nebulizer solution INHALE 1 VIAL VIA NEBULIZER EVERY 6-8 HOURS FOR WHEEZING.   . Multiple Vitamin (MULTIVITAMIN) tablet Take 1 tablet by mouth daily.   . pravastatin (PRAVACHOL) 40 MG tablet TAKE (1) TABLET BY MOUTH AT BEDTIME.   [name] Kitchen triamterene-hydrochlorothiazide (MAXZIDE-25) 37.5-25 MG tablet TAKE ONE TABLET BY MOUTH ONCE DAILY.   . VENTOLIN HFA 108 (90 Base) MCG/ACT inhaler INHALE 2 PUFFS EVERY 4 HOURS AS NEEDED.    No facility-administered encounter medications on file as of 07/05/2015.    Functional Status:  In your present state of health, do you have any difficulty performing the following activities: 06/15/2015 06/09/2015  Hearing? N -  Vision? N -  Difficulty concentrating or making decisions? Y -  Walking or climbing stairs? N -  Dressing or bathing? N -  Doing errands, shopping? N -  Preparing Food and eating ? N N  Using the Toilet? N N  In the past six months, have you accidently leaked urine? N N  Do you have problems with loss of bowel control? N N  Managing your Medications? N N  Managing your Finances? Tempie Donning  Housekeeping or managing your Housekeeping? Y N    Fall/Depression Screening:  PHQ 2/9 Scores 06/29/2015 06/15/2015 06/07/2015 11/04/2014 08/08/2014 06/16/2013  07/23/2012  PHQ - 2 Score _0 0  PHQ- 9 Score 6 5 - _1 -    Assessment:   CSW traveled to home of client in [place], [place] on 07/05/15. CSW met with client on 07/05/15 at home of client for a routine home visit. Client is receiving [facility] nursing support with RN [name]. CSW had talked with client on several occasions about his using [place] for transport support for client to go to and from client medical appointments.  Ian Shy RN is planning to add client name to [place] transport assist list. On 07/05/15, CSW gave client 6  red tickets to cover 3 round trip transports with [place] within [place], [place] reminded client to call that transport agency at least 3 business days prior to needed transport assistance from agency. Client said he has prescribed medications and is taking medications as prescribed. He is eating adequately and sleeping adequately. He said he does become short of breath or fatigued periodically and has to take short rest breaks. He tries to mow his yard with riding mower. RN [name] contacted [facility] agency recently and requested help from that agency regarding client's yard care needs for this Summer.  Client said he is not using a cane or walker to ambulate.  Client said he is using nebulizer as prescribed.  Client said  he uses inhaler as prescribed.  Client sees Dr. Moshe [name] as primary care doctor. Client has appointment with Dr. Luan [name] on 08/16/15.   CSW encouraged client to use transport agency above to help client with client transport needs. Client said he does well with activities of daily living.  CSW encouraged client to visit local [place] to learn more about free social activities in the area. Client lives near [employer]. CSW encouraged client to visit [place] and talk with staff at [facility] about social  activities at [facility]. CSW gave client [facility] CSW card and encouraged [name] to call CSW at [phone]. CSW also wrote phone number for [place] in large numbers on piece of paper for client to use to call to schedule client transport assistance. CSW thanked [name] for allowing CSW to visit [name] at home of client on 07/05/15. CSW encouraged [name] to communicate with CSW in next 30 days as needed to discuss transport resources for client in the area.    .   Plan:   Client to communicate with CSW in next 30 days to discuss transport resources for client in the community. CSW to collaborate with RN [name] in monitoring needs of client. CSW to call client in 4 weeks to assess client needs at that time.  [name].[name] MSW, LCSW Licensed Clinical Social Worker [facility] Care Management [phone]

## 2015-07-07 ENCOUNTER — Other Ambulatory Visit: Payer: Self-pay | Admitting: *Deleted

## 2015-07-07 NOTE — Patient Outreach (Signed)
[facility]) Care Management  07/07/2015  [name] 1950/11/22 [id number]  In collaboration with and at the request of [name] LCSW with [place], I called the intake coordinator at [facility] today and provided requested information to get [name] into the system and arranged for transportation to his August 23 9am appointment with Dr. [name].   Plan: I will notify [name] of the aforementioned information and will review it with [name] at our next conversation.    [place] Management  [phone]

## 2015-07-19 ENCOUNTER — Other Ambulatory Visit: Payer: Self-pay | Admitting: Family Medicine

## 2015-07-26 ENCOUNTER — Other Ambulatory Visit: Payer: Self-pay | Admitting: Licensed Clinical Social Worker

## 2015-07-26 NOTE — Patient Outreach (Signed)
Assessment:  CSW spoke via phone with client on 07/26/15. CSW verified client identity. CSW and client spoke of client needs. Client said he has his prescribed medications and is taking medications as prescribed. He sees Dr. Moshe Cipro as his primary care doctor. He uses his moped to travel short distances to and from his appointments and to complete needed errands. He does become fatigued easily, especially in hot weather. He takes periodic rest breaks when fatigued or short of breath.  Ian Shy RN communicated with practice of Dr. Moshe Cipro recently related to oxygen needs of client. Dr. Moshe Cipro is requesting that client set up office visit with Dr. Moshe Cipro to discuss client's breathing/oxygen needs. Client, at present, does not have oxygen concentrator at his home. He does use inhalers as prescribed. He does use nebulizer daily as prescribed. Client does have scheduled office visit with Dr. Moshe Cipro for August 30, 2015 at 9:00 AM. RN [name] had added client to transportation list with [place]. [place] is scheduled to transport client to and from August 30, 2015 client appointment with Dr. Moshe Cipro. Client said he still needs help with lawn care. RN [name] had made referral for client to [facility] for [place] care Client is waiting for volunteers from that organization to help client with lawn care. CSW and client spoke of client care plan. CSW encouraged [name] to communicate with CSW in next 30 days to discuss transportation resources for client in the community.  CSW encouraged [name] to call CSW at [phone] as needed for social work support. CSW encouraged [name] to call RN [name] as needed to nursing support from [place] thanked [name] for phone conversation with CSW on 07/26/15.   Plan:  Client to communicate with CSW in next 30 days to discuss transportation resources for client  in the community. CSW to collaborate with RN [name] as needed in monitoring client needs. CSW to call client in 4 weeks to assess client needs at that time.  [name].[name] MSW, LCSW Licensed Clinical Social Worker [facility] Care Management [phone]

## 2015-07-27 ENCOUNTER — Other Ambulatory Visit: Payer: Self-pay | Admitting: *Deleted

## 2015-07-27 NOTE — Patient Outreach (Signed)
[facility]) Care Management  07/27/2015  [name] 03-23-1950 [id number]   [name] is an 65 y.o. male who is a patient familiar to [place] Management from a previous referral. [name] was referred to [place] by [facility] after he was recently hospitalized (5/23-5/25/2017) for acute respiratory failure, COPD exacerbation. [name] has history of CVA, COPD,HTN, Asthma.   Mr. [name] LCSW and I have been working with [name] re: COPD disease management and transportation concerns.  PsychoSocial Needs:  Transportation:  I called [facility] to help arrange transportation for his August appointment with Dr. Moshe [name] for August 30, 2015. His pickup time will be between 8:15 and 8:45am. He has passes provided to him by [name]. In the future, if [name] uses [facility] and has to pay out of pocket, his cost will be $2 one way.    Assistance with lawn care: I reached out to [facility] last month providing a request for assistance with weed eating, lawn care needs and was advised that the local contact would be provided with information including Mr. Vandewater direct contact information. Mr. [name] LCSW discussed this with Mr. Ursini in a phone outreach yesterday and noted that Mr. Oriley has not heard from [facility] men volunteers. I called [facility] at [phone] ext. [phone] again today to follow up on the request.    Chronic Health Conditions/Needs:   Chronic Health Condition/DME: [name] called me concerning [name] inquiries about oxygen needs/requests. I provided the information from [name] related to this request to Dr. Moshe [name] on 07/19/15. [name] wants oxygen to use while working in the yard and riding his moped. I spent a great deal of time explaining to [name] that it is not recommended that he work outside or ride his moped when the temperatures are high.   Nutrition and Supply Requests: [name] has asked if we [facility])  can get nutrition supplements for him. We do not supply nutrition supplements. I provided [name] with information re: the least expensive dietician recommended nutrition supplements that [name] can buy at the local grocery ([employer] instant breakfast).   Plan: [name] and I discussed [name] progress. [name] will let me know if other nursing needs arise.    [place] Management  [phone]

## 2015-08-03 ENCOUNTER — Other Ambulatory Visit: Payer: Self-pay | Admitting: Licensed Clinical Social Worker

## 2015-08-03 NOTE — Patient Outreach (Signed)
Assessment:  CSW spoke via phone with client on 08/03/15. CSW verified client identity. CSW and client spoke of client needs. Client said he has his prescribed medications and is taking medications as prescribed. Client said he is eating adequately and sleeping well. Client uses inhalers as prescribed to help with breathing. Client also uses nebulizer as prescribed for treatments to help him with breathing. Client has appointment scheduled with Dr. Moshe Cipro, primary care doctor, for August 30, 2015. [place] is scheduled to transport client to and from scheduled client appointment with Dr. Moshe Cipro on 08/30/15. Client said he becomes fatigued occasionally and has to take rest breaks.  He said he becomes fatigued in hot weather. He rides his moped to local, short distance appointments. He said he is trying to ride moped to appointments or errands early in day before it becomes too hot for client outdoors. Client receives [place] support with RN [name]. Client said he has been mostly staying at home recently due to extreme hot weather outdoors.  CSW reminded client that he could call 911 for emergency care/support. CSW encouraged client to call RN [name] or CSW [name] as needed for [facility] program support. Client said he had phone number for RN [name]. He said he had phone number for [place] and client spoke of client care plan. CSW encouraged client to communicate with CSW in next 30 days to discuss transportation resources for client in the community.  CSW thanked Ar[place]dy for phone call with CSW on 08/03/15.  Plan:  Client to communicate with CSW in next 30 days to discuss transportation resources for client in the community. CSW to collaborate with RN [name] in monitoring client needs. CSW to call in 3 weeks to assess client needs at that time.  Norva Riffle.Rhegan Trunnell MSW, LCSW Licensed Clinical Social Worker [facility] Care  Management [phone]

## 2015-08-10 ENCOUNTER — Other Ambulatory Visit: Payer: Self-pay | Admitting: Family Medicine

## 2015-08-11 DIAGNOSIS — J449 Chronic obstructive pulmonary disease, unspecified: Secondary | ICD-10-CM | POA: Diagnosis not present

## 2015-08-16 DIAGNOSIS — I10 Essential (primary) hypertension: Secondary | ICD-10-CM | POA: Diagnosis not present

## 2015-08-16 DIAGNOSIS — J449 Chronic obstructive pulmonary disease, unspecified: Secondary | ICD-10-CM | POA: Diagnosis not present

## 2015-08-16 DIAGNOSIS — J9611 Chronic respiratory failure with hypoxia: Secondary | ICD-10-CM | POA: Diagnosis not present

## 2015-08-22 ENCOUNTER — Other Ambulatory Visit: Payer: Self-pay | Admitting: Family Medicine

## 2015-08-22 DIAGNOSIS — J449 Chronic obstructive pulmonary disease, unspecified: Secondary | ICD-10-CM | POA: Diagnosis not present

## 2015-08-24 ENCOUNTER — Other Ambulatory Visit: Payer: Self-pay | Admitting: Licensed Clinical Social Worker

## 2015-08-24 NOTE — Patient Outreach (Signed)
Assessment:  CSW spoke via phone with client on 08/24/15. CSW verified client identity. CSW and client spoke of client needs. Client said he has his prescribed medications and is taking medications as prescribed.. He said he uses inhaler as needed to help with breathing. He said he uses nebulizer as prescribed daily for breathing treatments for client. Client has scheduled appointment with Dr. Moshe Cipro for 08/30/15. [place] is scheduled to transport client to and from 08/30/15 client appointment with Dr. Moshe Cipro. Client said he rides his moped to nearby appointments and to do errands needed. He said he becomes short of breath or fatigued in hot weather. He said he takes rest breaks if he becomes short of breath or fatigued. Client is receiving [facility] nursing support with RN [name]. CSW and client spoke of client care plan. CSW encouraged [place] to communicate with CSW in next 30 days to discuss transportation resources for client in the community. CSW has encouraged client previously to involve himself in some local social activities to try to meet new friends. CSW has talked with [name] about local [place], [employer]; however, client has not indicated any interest in going to [employer]. Client has reduced support system in the area..  He said he does not receive support or help from any family members.  CSW encouraged [place] to communicate with RN [name] as needed for [facility] nursing support. CSW encouraged [place] to attend all scheduled client medical appointments.  CSW encouraged [place] to call  CSW at [phone] as needed to discuss social work needs of client.   Plan:  Client to communicate with CSW in next 30 days to discuss transportation resources for client in the community.  CSW to collaborate with RN [name] in monitoring needs of client.  CSW to call client in 4 weeks to assess client needs at that time.  Norva Riffle.Ahnesty Finfrock MSW,  LCSW Licensed Clinical Social Worker [facility] Care Management [phone]

## 2015-08-30 ENCOUNTER — Ambulatory Visit (INDEPENDENT_AMBULATORY_CARE_PROVIDER_SITE_OTHER): Payer: PPO | Admitting: Family Medicine

## 2015-08-30 ENCOUNTER — Encounter: Payer: Self-pay | Admitting: Family Medicine

## 2015-08-30 VITALS — BP 130/70 | HR 100 | Resp 16 | Ht 67.0 in | Wt 132.1 lb

## 2015-08-30 DIAGNOSIS — Z0000 Encounter for general adult medical examination without abnormal findings: Secondary | ICD-10-CM

## 2015-08-30 DIAGNOSIS — Z23 Encounter for immunization: Secondary | ICD-10-CM

## 2015-08-30 NOTE — Patient Instructions (Signed)
Thank you for choosing [facility]  It was a pleasure seeing you today for your Medicare Annual Wellness Visit   In 3 months you will return to see Dr. Moshe Cipro  Your lab order will be mailed to your home address on file to have done a week before your visit.

## 2015-08-30 NOTE — Progress Notes (Signed)
Subjective:    [name] is a 65 y.o. male who presents for Medicare Annual/Subsequent preventive examination.   Preventive Screening-Counseling & Management  Tobacco History  Smoking Status  . Former Smoker  . Packs/day: 0.30  . Types: Cigarettes  . Quit date: 04/14/2015  Smokeless Tobacco  . Never Used     Current Problems (verified) Patient Active Problem List   Diagnosis Date Noted  . Hospital discharge follow-up 06/19/2015  . COPD (chronic obstructive pulmonary disease) ([place]) 05/30/2015  . Annual physical exam 03/25/2015  . Abnormal TSH 04/10/2014  . Social isolation 03/17/2013  . Asthma with COPD (chronic obstructive pulmonary disease) ([place]) 11/12/2012  . History of substance abuse 01/14/2011  . Anemia 01/14/2011  . ECHOCARDIOGRAM, ABNORMAL 03/06/2010  . VITAMIN D DEFICIENCY 06/28/2009  . IGT (impaired glucose tolerance) 01/25/2009  . PSA, INCREASED 01/25/2009  . Hyperlipidemia 07/30/2007  . Essential hypertension 07/30/2007    Medications Prior to Visit Current Outpatient Prescriptions on File Prior to Visit  Medication Sig Dispense Refill  . ADVAIR DISKUS 500-50 MCG/DOSE AEPB INHALE 1 PUFF BY MOUTH TWICE A DAY. RINSE MOUTH AFTER USE. 60 each 6  . albuterol (PROVENTIL) (2.5 MG/3ML) 0.083% nebulizer solution TAKE 1 AMPULE (3 MLS) BY NEBULIZATION AS DIRECTED EVERY 4 HOURS AS NEEDED. 375 mL 2  . aspirin 81 MG tablet Take 81 mg by mouth daily.    . COMBIVENT RESPIMAT 20-100 MCG/ACT AERS respimat Take 1 puff by mouth every 6 (six) hours as needed.     [name] Kitchen ipratropium (ATROVENT) 0.02 % nebulizer solution INHALE 1 VIAL VIA NEBULIZER EVERY 6-8 HOURS FOR WHEEZING. 312.5 mL 2  . Multiple Vitamin (MULTIVITAMIN) tablet Take 1 tablet by mouth daily.    . pravastatin (PRAVACHOL) 40 MG tablet TAKE (1) TABLET BY MOUTH AT BEDTIME. 30 tablet 3  . triamterene-hydrochlorothiazide (MAXZIDE-25) 37.5-25 MG tablet TAKE ONE TABLET BY MOUTH ONCE DAILY. 30 tablet 3  . VENTOLIN HFA 108  (90 Base) MCG/ACT inhaler INHALE 2 PUFFS EVERY 4 HOURS AS NEEDED. 18 g 6  . [DISCONTINUED] simvastatin (ZOCOR) 40 MG tablet Take 40 mg by mouth daily.     No current facility-administered medications on file prior to visit.     Current Medications (verified) Current Outpatient Prescriptions  Medication Sig Dispense Refill  . ADVAIR DISKUS 500-50 MCG/DOSE AEPB INHALE 1 PUFF BY MOUTH TWICE A DAY. RINSE MOUTH AFTER USE. 60 each 6  . albuterol (PROVENTIL) (2.5 MG/3ML) 0.083% nebulizer solution TAKE 1 AMPULE (3 MLS) BY NEBULIZATION AS DIRECTED EVERY 4 HOURS AS NEEDED. 375 mL 2  . aspirin 81 MG tablet Take 81 mg by mouth daily.    . COMBIVENT RESPIMAT 20-100 MCG/ACT AERS respimat Take 1 puff by mouth every 6 (six) hours as needed.     [name] Kitchen ipratropium (ATROVENT) 0.02 % nebulizer solution INHALE 1 VIAL VIA NEBULIZER EVERY 6-8 HOURS FOR WHEEZING. 312.5 mL 2  . Multiple Vitamin (MULTIVITAMIN) tablet Take 1 tablet by mouth daily.    . pravastatin (PRAVACHOL) 40 MG tablet TAKE (1) TABLET BY MOUTH AT BEDTIME. 30 tablet 3  . triamterene-hydrochlorothiazide (MAXZIDE-25) 37.5-25 MG tablet TAKE ONE TABLET BY MOUTH ONCE DAILY. 30 tablet 3  . VENTOLIN HFA 108 (90 Base) MCG/ACT inhaler INHALE 2 PUFFS EVERY 4 HOURS AS NEEDED. 18 g 6   No current facility-administered medications for this visit.      Allergies (verified) Review of patient's allergies indicates no known allergies.   PAST HISTORY  Family History History reviewed. No pertinent  family history.  Social History Social History  Substance Use Topics  . Smoking status: Former Smoker    Packs/day: 0.30    Types: Cigarettes    Quit date: 04/14/2015  . Smokeless tobacco: Never Used  . Alcohol use 0.0 oz/week     Comment: occasionally    Are there smokers in your home (other than you)?  No  Risk Factors Current exercise habits: The patient does not participate in regular exercise at present.  Dietary issues discussed: Heart healthy low fat  diet encouraged   Cardiac risk factors: advanced age (older than 87 for men, 28 for women), male gender and smoking/ tobacco exposure.  Depression Screen (Note: if answer to either of the following is "Yes", a more complete depression screening is indicated)   Q1: Over the past two weeks, have you felt down, depressed or hopeless? No  Q2: Over the past two weeks, have you felt little interest or pleasure in doing things? No  Have you lost interest or pleasure in daily life? No  Do you often feel hopeless? No  Do you cry easily over simple problems? No  Activities of Daily Living In your present state of health, do you have any difficulty performing the following activities?:  Driving? Yes does ride moped  Managing money?  No Feeding yourself? No Getting from bed to chair? No Climbing a flight of stairs? No Preparing food and eating?: No Bathing or showering? No Getting dressed: No Getting to the toilet? No Using the toilet:No Moving around from place to place: No In the past year have you fallen or had a near fall?:No   Are you sexually active?  Yes  Do you have more than one partner?  Unanswered   Hearing Difficulties: No Do you often ask people to speak up or repeat themselves? No Do you experience ringing or noises in your ears? No Do you have difficulty understanding soft or whispered voices? No   Do you feel that you have a problem with memory? No  Do you often misplace items? No  Do you feel safe at home?  Yes  Cognitive Testing  Alert? Yes  Normal Appearance?Yes  Oriented to person? Yes  Place? Yes   Time? Yes  Recall of three objects?  Yes  Can perform simple calculations? Yes  Displays appropriate judgment?Yes  Can read the correct time from a watch face?Yes   Advanced Directives have been discussed with the patient? Yes   List the Names of Other Physician/Practitioners you currently use: 1.  Dr. Sinda Du (Pulmonology) 2. Dr. Irine Seal ( Urology)    Indicate any recent Medical Services you may have received from other than [facility] providers in the past year (date may be approximate).  Immunization History  Administered Date(s) Administered  . Influenza Split 10/23/2011  . Influenza Whole 10/02/2010  . Influenza,inj,Quad PF,36+ Mos 11/11/2012, 11/10/2013, 10/11/2014  . Pneumococcal Conjugate-13 03/28/2014  . Pneumococcal Polysaccharide-23 05/30/2010, 06/19/2015  . Td 09/22/2003    Screening Tests Health Maintenance  Topic Date Due  . INFLUENZA VACCINE  08/08/2015  . TETANUS/TDAP  12/26/2015 (Originally 09/21/2013)  . ZOSTAVAX  12/28/2015 (Originally 04/19/2008)  . HEMOGLOBIN A1C  09/16/2015  . COLONOSCOPY  07/14/2016  . Hepatitis C Screening  Completed  . PNA vac Low Risk Adult  Completed    All answers were reviewed with the patient and necessary referrals were made:  [name], MD   08/30/2015   History reviewed: allergies, current medications, past family history,  past medical history, past social history, past surgical history and problem list  Review of Systems A comprehensive review of systems was negative.    Objective:     Vision by Snellen chart: right eye:20/50, left eye:20/50 Blood pressure 130/70, pulse 100, resp. rate 16, height 5\' 7"  (1.702 m), weight 132 lb 1.9 oz (59.9 kg), SpO2 93 %. Body mass index is 20.69 kg/m.  No exam performed today, annual wellness visit without physical exam.     Assessment:       Plan:     During the course of the visit the patient was educated and counseled about appropriate screening and preventive services including:    continue to work with [facility] to get assistance with social needs   Diet review for nutrition referral? Yes ____  Not Indicated __x__   Patient Instructions (the written plan) was given to the patient.  Medicare Attestation I have personally reviewed: The patient's medical and social history Their use of alcohol, tobacco or illicit  drugs Their current medications and supplements The patient's functional ability including ADLs,fall risks, home safety risks, cognitive, and hearing and visual impairment Diet and physical activities Evidence for depression or mood disorders  The patient's weight, height, BMI, and visual acuity have been recorded in the chart.  I have made referrals, counseling, and provided education to the patient based on review of the above and I have provided the patient with a written personalized care plan for preventive services.     [name], MD   08/30/2015       Medicare annual wellness visit, subsequent Annual exam as documented. Counseling done  re healthy lifestyle involving commitment to 150 minutes exercise per week, heart healthy diet, and attaining healthy weight.The importance of adequate sleep also discussed. Regular seat belt use and home safety, is also discussed. Changes in health habits are decided on by the patient with goals and time frames  set for achieving them. Immunization and cancer screening needs are specifically addressed at this visit.   Need for prophylactic vaccination and inoculation against influenza After obtaining informed consent, the vaccine is  administered by LPN.

## 2015-08-31 NOTE — Assessment & Plan Note (Signed)

## 2015-08-31 NOTE — Assessment & Plan Note (Signed)
After obtaining informed consent, the vaccine is  administered by LPN.  

## 2015-09-23 ENCOUNTER — Other Ambulatory Visit: Payer: Self-pay | Admitting: Family Medicine

## 2015-09-26 ENCOUNTER — Other Ambulatory Visit: Payer: Self-pay | Admitting: Licensed Clinical Social Worker

## 2015-09-26 NOTE — Patient Outreach (Signed)
Assessment:  CSW spoke via phone with client on 09/26/15.CSW verified client identity. CSW and Jahbari spoke of client needs. Client sees Dr. Moshe Cipro as primary care doctor. Client has been receiving [facility] nursing support with RN [name]. Client rides his moped for local short distance errands and appointments. Client has little family support. Client has prescribed medications and is taking medications as prescribed.  Client said he has inhalers and is using inhalers as prescribed. He said he has nebulizer and is using nebulizer as prescribed. He said he went to medical appointment with Dr. Moshe Cipro last month. He had expressed interest in portable oxygen cannister but was informed that he would likely have to pay for such a portable cannister. Portable oxgyen cannister would be fairly expensive and client said he would not be able to pay for such a portable oxygen cannister.  Client said he fatigues with short distance ambulation.  Client said he is caring for himself adequately. He said he cooks small meals and is eating well.  Client said that [facility] transported him to medical appointment with Dr. Moshe Cipro last month. He had difficulty with return transport with agency. He had a friend to bring him home from that medical appointnment with Dr. Moshe Cipro.  CSW and client spoke of client care plan.  CSW encouraged client to communicate with CSW in next 30 days to discuss transportation resources for client in the area.  CSW encouraged client to call RN [name] or CSW [name] as needed for [facility] program support. CSW thanked Raydell for phone conversation with CSW on 09/26/15.   Plan:  Client to communicate with CSW in next 30 days to discuss transportation resources for client in the community.  CSW to collaborate with RN [name] as needed in  monitoring needs of client.  CSW to call client in 4 weeks to assess client needs.   Norva Riffle.Danyl Deems MSW,  LCSW Licensed Clinical Social Worker [facility] Care Management [phone]

## 2015-10-26 ENCOUNTER — Other Ambulatory Visit: Payer: Self-pay | Admitting: Licensed Clinical Social Worker

## 2015-10-26 ENCOUNTER — Encounter: Payer: Self-pay | Admitting: Licensed Clinical Social Worker

## 2015-10-26 NOTE — Patient Outreach (Signed)
Assessment:  CSW spoke via phone with client on 10/26/15. CSW verified client identity. CSW and client spoke of client needs. Client sees Dr. Moshe Cipro as primary care doctor. Client is receiving [facility] nursing support with RN [name]. Client rides his moped to local medical appointments and to completed needed errands. Client said he has his prescribed medications and is taking medications as prescribed.  Client said he is eating adequately and sleeping adequately. Client uses inhaler as prescribed to help with breathing needs. Client said he uses nebulizer as prescribed for daily breathing treatments. CSW and client spoke of client care plan. CSW encouraged [place] to communicate with CSW in next 30 days to discuss transport resources for client in the community.  Client said he sometimes fatigues with short distance ambulation. He said he takes rest breaks as needed.  Client sees Dr. Luan Pulling, pulmonologist, as scheduled.  CSW has encouraged Makell to go to local [employer] for socialization. Client lives near [employer]. However, Jerral has not expressed interest in going to [employer] for socialization or activity participation.  CSW encouraged Jandell to call CSW at [phone] as needed to discuss social work needs of client.   Plan:  Client to communicate with CSW in next 30 days to discuss transportation resources for client in the community.  CSW to collaborate with RN [name] in monitoring needs of client.  CSW to call client in 4 weeks to assess client needs.  Norva Riffle.Estie Sproule MSW, LCSW Licensed Clinical Social Worker [facility] Care Management [phone]

## 2015-11-02 ENCOUNTER — Other Ambulatory Visit: Payer: Self-pay | Admitting: Family Medicine

## 2015-11-10 DIAGNOSIS — J9611 Chronic respiratory failure with hypoxia: Secondary | ICD-10-CM | POA: Diagnosis not present

## 2015-11-10 DIAGNOSIS — J449 Chronic obstructive pulmonary disease, unspecified: Secondary | ICD-10-CM | POA: Diagnosis not present

## 2015-11-17 DIAGNOSIS — I10 Essential (primary) hypertension: Secondary | ICD-10-CM | POA: Diagnosis not present

## 2015-11-17 DIAGNOSIS — J449 Chronic obstructive pulmonary disease, unspecified: Secondary | ICD-10-CM | POA: Diagnosis not present

## 2015-11-17 DIAGNOSIS — J9611 Chronic respiratory failure with hypoxia: Secondary | ICD-10-CM | POA: Diagnosis not present

## 2015-11-21 ENCOUNTER — Telehealth: Payer: Self-pay

## 2015-11-21 NOTE — Telephone Encounter (Signed)
Error

## 2015-11-22 DIAGNOSIS — J441 Chronic obstructive pulmonary disease with (acute) exacerbation: Secondary | ICD-10-CM | POA: Diagnosis not present

## 2015-11-29 ENCOUNTER — Other Ambulatory Visit: Payer: Self-pay | Admitting: Licensed Clinical Social Worker

## 2015-11-29 NOTE — Patient Outreach (Signed)
Assessment:  CSW spoke via phone with client on 11/29/15. CSW verified client identity. CSW and client spoke of client needs. Client said he had his prescribed medications and is taking medications as prescribed. He said he is attending medical appointments as scheduled. He sees Dr. Moshe Cipro as primary care doctor. Client also sees Dr. Luan Pulling, pulmonologist, as scheduled. Client sees Dr. Jeffie Pollock, urologist, as scheduled.  CSW had encouraged client to utilize [place] for transport support for client to go to and from client's scheduled medical appointments.  Client also has a moped he uses to travel short distances and to complete errands in the community. Client said he had adequate food supply. Client uses inhaler to help with client's breathing needs. Client uses nebulizer as prescribed for breathing treatments for client.  CSW and client spoke of client care plan. CSW encouraged Brysten to communicate with CSW in next 30 days to discuss transport resources for client in the community. CSW has talked on several occasions with client about social involvement of client with friends. CSW has encouraged client numerous times to involve himself in activities at local [facility]. This [place] is in close proximity to home of client. However, at this time, client has not involved himself in activities at [facility].  Client said he has reduced family support.  CSW has encouraged client to socialize with others at community activities or events.  Client continues to have reduced social involvement with others.  Client receives [place] support with RN [name]. CSW has encouraged client to call CSW at [phone] as needed to discuss social work needs of client.    Plan:  Client to communicate with CSW in next 30 days to discuss transport resources for client in the community.  CSW to collaborate with RN [name] in monitoring needs of  client.  CSW to call client in 4 weeks to assess client needs.  Norva Riffle.Areal Cochrane MSW, LCSW Licensed Clinical Social Worker [facility] Care Management [phone]

## 2015-12-05 ENCOUNTER — Ambulatory Visit (INDEPENDENT_AMBULATORY_CARE_PROVIDER_SITE_OTHER): Payer: PPO | Admitting: Family Medicine

## 2015-12-05 ENCOUNTER — Encounter: Payer: Self-pay | Admitting: Family Medicine

## 2015-12-05 VITALS — BP 120/68 | HR 98 | Resp 16 | Ht 67.0 in | Wt 139.0 lb

## 2015-12-05 DIAGNOSIS — I10 Essential (primary) hypertension: Secondary | ICD-10-CM

## 2015-12-05 DIAGNOSIS — E785 Hyperlipidemia, unspecified: Secondary | ICD-10-CM | POA: Diagnosis not present

## 2015-12-05 DIAGNOSIS — J449 Chronic obstructive pulmonary disease, unspecified: Secondary | ICD-10-CM

## 2015-12-05 DIAGNOSIS — E559 Vitamin D deficiency, unspecified: Secondary | ICD-10-CM

## 2015-12-05 MED ORDER — PREDNISONE 5 MG (21) PO TBPK: 5.0000 mg | ORAL_TABLET | ORAL | 0 refills | Status: DC

## 2015-12-05 MED ORDER — METHYLPREDNISOLONE ACETATE 80 MG/ML IJ SUSP
80.0000 mg | Freq: Once | INTRAMUSCULAR | Status: AC
  Administered 2015-12-05: 80 mg via INTRAMUSCULAR

## 2015-12-05 NOTE — Patient Instructions (Signed)
Wellness visit in December, call if you need me sooner  MD f/u in 4 months, call if you need me sooner  Fasting lipid, cmp , hBA1c, TSH and Vit D in next 1 to 2 weeks  You are treated  For COPD flare, depo medrol in office and prednisone sent in

## 2015-12-11 NOTE — Assessment & Plan Note (Signed)
Hyperlipidemia:Low fat diet discussed and encouraged.   Lipid Panel  Lab Results  Component Value Date   CHOL 197 03/16/2015   HDL 82 03/16/2015   LDLCALC 102 03/16/2015   TRIG 65 03/16/2015   CHOLHDL 2.4 03/16/2015   Controlled, no change in medication

## 2015-12-11 NOTE — Progress Notes (Signed)
   [name]     [name]: [id number]      DOB: Jun 15, 1950   HPI [name] is here for follow up and re-evaluation of chronic medical conditions, medication management and review of any available recent lab and radiology data.  Preventive health is updated, specifically  Cancer screening and Immunization.   Questions or concerns regarding consultations or procedures which the PT has had in the interim are  addressed. The PT denies any adverse reactions to current medications since the last visit.  1 day h/o increased wheezing and sOB, denies fever, chills or sputum, no body aches  ROS Denies recent fever or chills. Denies sinus pressure, nasal congestion, ear pain or sore throat.  Denies chest pains, palpitations and leg swelling Denies abdominal pain, nausea, vomiting,diarrhea or constipation.   Denies dysuria, frequency, hesitancy or incontinence. Denies joint pain, swelling and limitation in mobility. Denies headaches, seizures, numbness, or tingling. Denies depression, anxiety or insomnia. Denies skin break down or rash.   PE  BP 120/68   Pulse 98   Resp 16   Ht 5\' 7"  (1.702 m)   Wt 139 lb (63 kg)   SpO2 91%   BMI 21.77 kg/m   Patient alert and oriented and in mild  cardiopulmonary distress.  HEENT: No facial asymmetry, EOMI,   oropharynx pink and moist.  Neck supple no JVD, no mass.  Chest: decreased air entry, bilateral wheezes, no crackles  CVS: S1, S2 no murmurs, no S3.Regular rate.  ABD: Soft non tender.   Ext: No edema  MS: Adequate ROM spine, shoulders, hips and knees.  Skin: Intact, no ulcerations or rash noted.  Psych: Good eye contact, normal affect. Memory intact not anxious or depressed appearing.  CNS: CN 2-12 intact, power,  normal throughout.no focal deficits noted.   Assessment & Plan  Asthma with COPD (chronic obstructive pulmonary disease) Acute flare, depo medrol in office followed by short course of prednisone  Essential  hypertension Controlled, no change in medication DASH diet and commitment to daily physical activity for a minimum of 30 minutes discussed and encouraged, as a part of hypertension management. The importance of attaining a healthy weight is also discussed.  BP/Weight 12/05/2015 08/30/2015 06/29/2015 06/19/2015 06/01/2015 03/24/2015 123456  Systolic BP 123456 [id number] 123456 Q000111Q [id number]  Diastolic BP 68 70 80 74 82 84 90  Wt. (Lbs) 139 132.12 - 136 128.53 148 138.04  BMI 21.77 20.69 - 21.3 20.13 23.9 22.29       Hyperlipidemia Hyperlipidemia:Low fat diet discussed and encouraged.   Lipid Panel  Lab Results  Component Value Date   CHOL 197 03/16/2015   HDL 82 03/16/2015   LDLCALC 102 03/16/2015   TRIG 65 03/16/2015   CHOLHDL 2.4 03/16/2015   Controlled, no change in medication

## 2015-12-11 NOTE — Assessment & Plan Note (Signed)
Acute flare, depo medrol in office followed by short course of prednisone

## 2015-12-11 NOTE — Assessment & Plan Note (Signed)
Controlled, no change in medication DASH diet and commitment to daily physical activity for a minimum of 30 minutes discussed and encouraged, as a part of hypertension management. The importance of attaining a healthy weight is also discussed.  BP/Weight 12/05/2015 08/30/2015 06/29/2015 06/19/2015 06/01/2015 03/24/2015 123456  Systolic BP 123456 [id number] 0000000 123456 Q000111Q [id number]  Diastolic BP 68 70 80 74 82 84 90  Wt. (Lbs) 139 132.12 - 136 128.53 148 138.04  BMI 21.77 20.69 - 21.3 20.13 23.9 22.29

## 2015-12-12 ENCOUNTER — Other Ambulatory Visit: Payer: Self-pay | Admitting: Family Medicine

## 2015-12-12 ENCOUNTER — Telehealth: Payer: Self-pay

## 2015-12-12 DIAGNOSIS — E559 Vitamin D deficiency, unspecified: Secondary | ICD-10-CM | POA: Diagnosis not present

## 2015-12-12 DIAGNOSIS — R7301 Impaired fasting glucose: Secondary | ICD-10-CM | POA: Diagnosis not present

## 2015-12-12 DIAGNOSIS — I10 Essential (primary) hypertension: Secondary | ICD-10-CM | POA: Diagnosis not present

## 2015-12-12 DIAGNOSIS — E785 Hyperlipidemia, unspecified: Secondary | ICD-10-CM | POA: Diagnosis not present

## 2015-12-12 LAB — COMPREHENSIVE METABOLIC PANEL
ALT: 18 U/L (ref 9–46)
AST: 15 U/L (ref 10–35)
Albumin: 4 g/dL (ref 3.6–5.1)
Alkaline Phosphatase: 52 U/L (ref 40–115)
BILIRUBIN TOTAL: 0.8 mg/dL (ref 0.2–1.2)
BUN: 15 mg/dL (ref 7–25)
CO2: 34 mmol/L — AB (ref 20–31)
CREATININE: 0.91 mg/dL (ref 0.70–1.25)
Calcium: 9.4 mg/dL (ref 8.6–10.3)
Chloride: 99 mmol/L (ref 98–110)
GLUCOSE: 112 mg/dL — AB (ref 65–99)
Potassium: 4.6 mmol/L (ref 3.5–5.3)
SODIUM: 141 mmol/L (ref 135–146)
Total Protein: 6.3 g/dL (ref 6.1–8.1)

## 2015-12-12 LAB — LIPID PANEL
CHOL/HDL RATIO: 1.9 ratio (ref <5.0)
Cholesterol: 207 mg/dL — ABNORMAL HIGH (ref <200)
HDL: 111 mg/dL (ref >40)
LDL CALC: 82 mg/dL (ref <100)
Triglycerides: 68 mg/dL (ref <150)
VLDL: 14 mg/dL (ref <30)

## 2015-12-12 LAB — HEMOGLOBIN A1C
Hgb A1c MFr Bld: 6.1 % — ABNORMAL HIGH (ref <5.7)
Mean Plasma Glucose: 128 mg/dL

## 2015-12-12 LAB — TSH: TSH: 5.15 mIU/L — ABNORMAL HIGH (ref 0.40–4.50)

## 2015-12-12 MED ORDER — AZITHROMYCIN 250 MG PO TABS: ORAL_TABLET | ORAL | 0 refills | Status: DC

## 2015-12-12 NOTE — Telephone Encounter (Signed)
I am sending in a z pack for him, ls let him know

## 2015-12-12 NOTE — Telephone Encounter (Signed)
Patient aware.

## 2015-12-13 LAB — VITAMIN D 25 HYDROXY (VIT D DEFICIENCY, FRACTURES): Vit D, 25-Hydroxy: 31 ng/mL (ref 30–100)

## 2015-12-29 ENCOUNTER — Other Ambulatory Visit: Payer: Self-pay | Admitting: Licensed Clinical Social Worker

## 2015-12-29 NOTE — Patient Outreach (Signed)
Assesment:  CSW spoke via phone with client. CSW verified client identity. CSW and client spoke of client needs.  Client said he had his prescribed medications and is taking medications as prescribed. He sees Dr. Moshe Cipro as primary care doctor.  He continues to ride his moped to and from local appointments and to complete community errands.  He said he has appointment on 12/29/15 with pulmonologist, Dr. Luan Pulling,  in [place].  He said he has inhaler to use as prescribed. He uses nebulizer as prescribed for breathing treatments. He said he has adequate food supply. CSW and client spoke of client care plan.  CSW encouraged client to communicate with CSW in next 30 days to discuss transportation resources for client in the community. Client said he continues to ride his moped to local appointments.  Client is receiving [facility] nursing support with RN [name].  CSW thanked Zymari for phone conversation with CSW on 12/29/15.  CSW encouraged [place] to call RN [name] at [phone] as needed to discuss nursing needs of client. CSW encouraged [place] to call CSW at [phone] as needed to discuss social work needs of client.  Client was appreciative of call from [place] on 12/29/15.     Plan:  Client to communicate with CSW in next 30 days to discuss transportation resources for client in the community.  CSW to collaborate with RN [name] in monitoring needs of client.  CSW to call client in 4 weeks to assess client needs.   Norva Riffle.Omolola Mittman MSW, LCSW Licensed Clinical Social Worker [facility] Care Management [phone]

## 2016-01-04 ENCOUNTER — Ambulatory Visit: Payer: PPO

## 2016-01-04 ENCOUNTER — Encounter: Payer: Self-pay | Admitting: Family Medicine

## 2016-02-01 ENCOUNTER — Other Ambulatory Visit: Payer: Self-pay | Admitting: Licensed Clinical Social Worker

## 2016-02-01 NOTE — Patient Outreach (Signed)
Assessment:  CSW spoke via phone with client. CSW verified client identity. CSW and client spoke of client needs. Client said he had his prescribed medications and is taking medications as prescribed. Client sees Dr. Moshe Cipro as primary care doctor. Client still uses his moped as a means of transportation to local appointments and to complete errands in the community. Client said he had adequate food supply.  Client said he is using nebulizer as prescribed to take needed client breathing treatments. Client has [place] with RN [name].  Client sees Dr. Luan Pulling, pulmonologist, as scheduled.  Client said he has a prescribed inhaler to use as needed for breathing assistance for client. CSW and client spoke of client care plan. CSW encouraged client to communicate with CSW in next 30 days to discuss community resources for transportation assistance for client. Client said he is trying to attend all scheduled client medical appointments.  CSW has encouraged client to call CSW at [phone] as needed to discuss social work needs of client.      Plan:  Client to communicate with CSW in next 30 days to discuss community resources for transportation assistance for client.  CSW to call client in 4 weeks to assess client needs at that time.    Norva Riffle.Monai Hindes MSW, LCSW Licensed Clinical Social Worker [facility] Care Management [phone]

## 2016-02-12 DIAGNOSIS — J9611 Chronic respiratory failure with hypoxia: Secondary | ICD-10-CM | POA: Diagnosis not present

## 2016-02-12 DIAGNOSIS — J449 Chronic obstructive pulmonary disease, unspecified: Secondary | ICD-10-CM | POA: Diagnosis not present

## 2016-02-19 ENCOUNTER — Ambulatory Visit: Payer: PPO

## 2016-02-27 ENCOUNTER — Other Ambulatory Visit: Payer: Self-pay | Admitting: Family Medicine

## 2016-02-27 DIAGNOSIS — J449 Chronic obstructive pulmonary disease, unspecified: Secondary | ICD-10-CM | POA: Diagnosis not present

## 2016-02-27 DIAGNOSIS — I10 Essential (primary) hypertension: Secondary | ICD-10-CM | POA: Diagnosis not present

## 2016-02-27 DIAGNOSIS — J9611 Chronic respiratory failure with hypoxia: Secondary | ICD-10-CM | POA: Diagnosis not present

## 2016-02-27 DIAGNOSIS — E46 Unspecified protein-calorie malnutrition: Secondary | ICD-10-CM | POA: Diagnosis not present

## 2016-03-04 ENCOUNTER — Other Ambulatory Visit: Payer: Self-pay | Admitting: Licensed Clinical Social Worker

## 2016-03-04 ENCOUNTER — Ambulatory Visit (INDEPENDENT_AMBULATORY_CARE_PROVIDER_SITE_OTHER): Payer: PPO

## 2016-03-04 VITALS — BP 138/84 | Temp 98.4°F | Ht 67.0 in | Wt 137.1 lb

## 2016-03-04 DIAGNOSIS — E785 Hyperlipidemia, unspecified: Secondary | ICD-10-CM

## 2016-03-04 DIAGNOSIS — R7302 Impaired glucose tolerance (oral): Secondary | ICD-10-CM

## 2016-03-04 DIAGNOSIS — Z0000 Encounter for general adult medical examination without abnormal findings: Secondary | ICD-10-CM | POA: Diagnosis not present

## 2016-03-04 DIAGNOSIS — R946 Abnormal results of thyroid function studies: Secondary | ICD-10-CM | POA: Diagnosis not present

## 2016-03-04 DIAGNOSIS — R7989 Other specified abnormal findings of blood chemistry: Secondary | ICD-10-CM

## 2016-03-04 DIAGNOSIS — I10 Essential (primary) hypertension: Secondary | ICD-10-CM

## 2016-03-04 NOTE — Patient Outreach (Signed)
Assessment:  CSW spoke via phone with client. CSW verified client identity. CSW and client spoke of client needs. Client sees Dr.  [name] as primary care doctor. Client is trying to attend scheduled client medical appointments. Client said he had his prescribed medications and is taking medications as prescribed. Client uses moped to help client travel to local appointments and to complete community errands. Client said he is eating adequately and sleeping adequately. Client said he had adequate food supply. Client said he is using nebulizer as prescribed to take needed client breathing treatments. Client said he had prescribed inhaler to use as needed to assist client with breathing.  Client sees Dr. Luan Pulling, pulmonologist, as scheduled. CSW and client spoke of client care plan. CSW encouraged client to communicate with CSW in next 30 days to discuss community resources for transportation assistance for client. Client did not mention any current pain issues for client.  Client said he usually cooks small meals for himself. He said he sometimes becomes fatigued in short distance ambulation.  CSW encouraged [place] to call CSW at [phone] as needed to discuss social work needs of client.     Plan:  Client to communicate with CSW in next 30 days to discuss community resources for transportation assistance for client.  CSW to call client in 4 weeks to assess client needs at that time.  Norva Riffle.Ronnika Collett MSW, LCSW Licensed Clinical Social Worker [facility] Care Management [phone]

## 2016-03-04 NOTE — Patient Instructions (Addendum)
[name] , Thank you for taking time to come for your Medicare Wellness Visit. I appreciate your ongoing commitment to your health goals. Please review the following plan we discussed and let me know if I can assist you in the future.     Screening recommendations: You are due for a Tetanus vaccine. Please call your insurance company to see if they cover this and call our office if you would like to receive this vaccine   Abnormal screenings: mini mental status exam. Recommend follow up with Dr. Moshe Cipro   Patient concerns: Sits at home all day alone, no support system, inability to stand for long periods at a time due to becoming short of breath. Community resource referral sent to our care guide Amy. She will be in contact within the next month to help assist you with these things.   Nurse concerns: Lack of support system.    Next appt: Follow up with Dr. Moshe Cipro on 04/11/2016 at 10:20 am for your annual physical exam. Fasting lab work has been ordered for you today, please have this completed about 1 week before your upcoming appointment with Dr. Moshe Cipro. Follow up in 1 year for your annual wellness visit.  Advance directives discussed with patient today. I have assisted you with filling out these forms, they now only need to be notarized.  Preventive Care 68 Years and Older, Male Preventive care refers to lifestyle choices and visits with your health care provider that can promote health and wellness. What does preventive care include?  A yearly physical exam. This is also called an annual well check.  Dental exams once or twice a year.  Routine eye exams. Ask your health care provider how often you should have your eyes checked.  Personal lifestyle choices, including:  Daily care of your teeth and gums.  Regular physical activity.  Eating a healthy diet.  Avoiding tobacco and drug use.  Limiting alcohol use.  Practicing safe sex.  Taking low doses of aspirin every  day.  Taking vitamin and mineral supplements as recommended by your health care provider. What happens during an annual well check? The services and screenings done by your health care provider during your annual well check will depend on your age, overall health, lifestyle risk factors, and family history of disease. Counseling  Your health care provider may ask you questions about your:  Alcohol use.  Tobacco use.  Drug use.  Emotional well-being.  Home and relationship well-being.  Sexual activity.  Eating habits.  History of falls.  Memory and ability to understand (cognition).  Work and work Statistician. Screening  You may have the following tests or measurements:  Height, weight, and BMI.  Blood pressure.  Lipid and cholesterol levels. These may be checked every 5 years, or more frequently if you are over 68 years old.  Skin check.  Lung cancer screening. You may have this screening every year starting at age 68 if you have a 30-pack-year history of smoking and currently smoke or have quit within the past 15 years.  Fecal occult blood test (FOBT) of the stool. You may have this test every year starting at age 68.  Flexible sigmoidoscopy or colonoscopy. You may have a sigmoidoscopy every 5 years or a colonoscopy every 10 years starting at age 45.  Prostate cancer screening. Recommendations will vary depending on your family history and other risks.  Hepatitis C blood test.  Diabetes screening. This is done by checking your blood sugar (glucose) after you  have not eaten for a while (fasting). You may have this done every 1-3 years.  Abdominal aortic aneurysm (AAA) screening. You may need this if you are a current or former smoker.  Osteoporosis. You may be screened starting at age 37 if you are at high risk. Talk with your health care provider about your test results, treatment options, and if necessary, the need for more tests. Vaccines  Your health care  provider may recommend certain vaccines, such as:  Influenza vaccine. This is recommended every year.  Tetanus, diphtheria, and acellular pertussis (Tdap, Td) vaccine. You may need a Td booster every 10 years.  Zoster vaccine. You may need this after age 39.  Pneumococcal 13-valent conjugate (PCV13) vaccine. One dose is recommended after age 20.  Pneumococcal polysaccharide (PPSV23) vaccine. One dose is recommended after age 45.  Talk to your health care provider about which screenings and vaccines you need and how often you need them. This information is not intended to replace advice given to you by your health care provider. Make sure you discuss any questions you have with your health care provider. Document Released: 01/20/2015 Document Revised: 09/13/2015 Document Reviewed: 10/25/2014 Elsevier Interactive Patient Education  2017 [employer].

## 2016-03-04 NOTE — Progress Notes (Signed)
Subjective:   [name] is a 66 y.o. male who presents for Medicare Annual/Subsequent preventive examination.  Review of Systems:  Cardiac Risk Factors include: advanced age (>3men, >40 women);dyslipidemia;hypertension;male gender;sedentary lifestyle     Objective:    Vitals: BP 138/84   Temp 98.4 F (36.9 C) (Oral)   Ht 5\' 7"  (1.702 m)   Wt 137 lb 1.9 oz (62.2 kg)   SpO2 90%   BMI 21.48 kg/m   Body mass index is 21.48 kg/m.  Tobacco History  Smoking Status  . Former Smoker  . Packs/day: 0.50  . Years: 40.00  . Types: Cigarettes  . Quit date: 04/14/2015  Smokeless Tobacco  . Never Used     Counseling given: Not Answered   Past Medical History:  Diagnosis Date  . Asthma   . COPD (chronic obstructive pulmonary disease) ([place])   . CVA (cerebral vascular accident) ([place]) 2008   with temporary vision loss   . Depression   . Elevated PSA 2014   no diagnosis pf prostate cancer in 07/2014  . History of substance abuse 01/14/2011   Marijuana  . History of tobacco abuse 01/14/2011  . Hyperlipidemia   . Hypertension   . Marijuana abuse   . Nicotine addiction   . Prediabetes 2014   History reviewed. No pertinent surgical history. History reviewed. No pertinent family history. History  Sexual Activity  . Sexual activity: Not Currently    Outpatient Encounter Prescriptions as of 03/04/2016  Medication Sig  . ADVAIR DISKUS 500-50 MCG/DOSE AEPB INHALE 1 PUFF BY MOUTH TWICE A DAY. RINSE MOUTH AFTER USE.  [name] Kitchen aspirin 81 MG tablet Take 81 mg by mouth daily.  . COMBIVENT RESPIMAT 20-100 MCG/ACT AERS respimat Take 1 puff by mouth every 6 (six) hours as needed.   [name] Kitchen ipratropium (ATROVENT) 0.02 % nebulizer solution INHALE 1 VIAL VIA NEBULIZER EVERY 6-8 HOURS FOR WHEEZING.  . Multiple Vitamin (MULTIVITAMIN) tablet Take 1 tablet by mouth daily.  . pravastatin (PRAVACHOL) 40 MG tablet TAKE (1) TABLET BY MOUTH AT BEDTIME.  [name] Kitchen triamterene-hydrochlorothiazide (MAXZIDE-25) 37.5-25 MG  tablet TAKE ONE TABLET BY MOUTH ONCE DAILY.  . VENTOLIN HFA 108 (90 Base) MCG/ACT inhaler INHALE 2 PUFFS INTO THE LUNGS EVERY 4 HOURS AS NEEDED.  [name] Kitchen albuterol (PROVENTIL) (2.5 MG/3ML) 0.083% nebulizer solution TAKE 1 AMPULE (3 MLS) BY NEBULIZATION AS DIRECTED EVERY 4 HOURS AS NEEDED. (Patient not taking: Reported on 03/04/2016)  . [DISCONTINUED] azithromycin (ZITHROMAX) 250 MG tablet Two tablets on day one , then one tablet once daily for an additional 4 days  . [DISCONTINUED] predniSONE (STERAPRED UNI-PAK 21 TAB) 5 MG (21) TBPK tablet Take 1 tablet (5 mg total) by mouth as directed. Use as directed   No facility-administered encounter medications on file as of 03/04/2016.     Activities of Daily Living In your present state of health, do you have any difficulty performing the following activities: 03/04/2016 08/30/2015  Hearing? N N  Vision? N N  Difficulty concentrating or making decisions? N N  Walking or climbing stairs? N N  Dressing or bathing? Y N  Doing errands, shopping? Y N  Preparing Food and eating ? Y N  Using the Toilet? N N  In the past six months, have you accidently leaked urine? N N  Do you have problems with loss of bowel control? N N  Managing your Medications? N N  Managing your Finances? N N  Housekeeping or managing your Housekeeping? Y N  Some recent  data might be hidden    Patient Care Team: Fayrene Helper, MD as PCP - General Irine Seal, MD as Attending Physician (Urology) Sinda Du, MD as Consulting Physician (Pulmonary Disease) Katha Cabal, LCSW as [place] (Licensed Clinical Social Worker)   Assessment:    Exercise Activities and Dietary recommendations Current Exercise Habits: The patient does not participate in regular exercise at present, Exercise limited by: respiratory conditions(s)  Goals    . Increase water intake          Patient would like to increase his water intake to at least 4 glasses a day.       Fall Risk Fall Risk  03/04/2016 08/03/2015 07/26/2015 06/29/2015 06/15/2015  Falls in the past year? No No No No No  Risk for fall due to : - Impaired balance/gait;Medication side effect Medication side effect Medication side effect Medication side effect   Depression Screen PHQ 2/9 Scores 03/04/2016 08/03/2015 07/26/2015 06/29/2015  PHQ - 2 Score 4 2 2 2   PHQ- 9 Score 10 9 8 6     Cognitive Function MMSE - Mini Mental State Exam 03/04/2016  Orientation to time 2  Orientation to Place 5  Registration 3  Attention/ Calculation 0  Attention/Calculation-comments unable to spell  Recall 3  Language- name 2 objects 2  Language- repeat 1  Language- follow 3 step command 3  Language- read & follow direction 0  Language-read & follow direction-comments unable to read and write  Write a sentence 1  Copy design 1  Total score 21        Immunization History  Administered Date(s) Administered  . Influenza Split 10/23/2011  . Influenza Whole 10/02/2010  . Influenza,inj,Quad PF,36+ Mos 11/11/2012, 11/10/2013, 10/11/2014, 08/30/2015  . Pneumococcal Conjugate-13 03/28/2014  . Pneumococcal Polysaccharide-23 05/30/2010, 06/19/2015  . Td 09/22/2003   Screening Tests Health Maintenance  Topic Date Due  . TETANUS/TDAP  09/21/2013  . HEMOGLOBIN A1C  06/11/2016  . COLONOSCOPY  07/14/2016  . INFLUENZA VACCINE  Completed  . Hepatitis C Screening  Completed  . PNA vac Low Risk Adult  Completed      Plan:  I have personally reviewed and addressed the Medicare Annual Wellness questionnaire and have noted the following in the patient's chart:  A. Medical and social history B. Use of alcohol, tobacco or illicit drugs  C. Current medications and supplements D. Functional ability and status E.  Nutritional status F.  Physical activity G. Advance directives discussed and assisted with completing H. List of other physicians I.  Hospitalizations, surgeries, and ER visits in previous 12  months J.  [place] to include cognitive, depression, and falls L. Referrals and appointments - [employer] referral sent today.   In addition, I have reviewed and discussed with patient certain preventive protocols, quality metrics, and best practice recommendations. A written personalized care plan for preventive services as well as general preventive health recommendations were provided to patient.  Signed,   Stormy Fabian, LPN Lead Nurse Health Advisor

## 2016-03-11 ENCOUNTER — Other Ambulatory Visit: Payer: Self-pay | Admitting: Family Medicine

## 2016-03-13 ENCOUNTER — Encounter: Payer: Self-pay | Admitting: Licensed Clinical Social Worker

## 2016-03-13 ENCOUNTER — Other Ambulatory Visit: Payer: Self-pay | Admitting: Licensed Clinical Social Worker

## 2016-03-13 NOTE — Patient Outreach (Signed)
Assessment:  CSW spoke via phone with client. CSW verified client identity.CSW and client spoke of client needs. Client sees Dr. Moshe Cipro as primary care doctor.  Client said he had his prescribed medications and is taking medications as prescribed. Client said he drives his moped to scheduled local appointments; he said he drives his moped locally to complete community errands for client. Client said he uses nebulizer as prescribed for breathing treatments. Client said he uses inhaler as prescribed to help client with breathing needs of client. CSW informed client on 03/13/16 that client had met care plan goals for client with CSW services. Thus, CSW informed client on 03/13/16 that [facility] would discharge client from [facility] services on 03/13/16 since client had met care plan goals with CSW services.  Client agreed to this plan. CSW encouraged client to continue to attend scheduled client medical appointments.  CSW congratulated client on meeting client care plan goals with CSW services. Client was appreciative of CSW support in recent months.   Plan:  CSW is discharging [name]. DKirk Ruths on 03/13/16 from [facility] services since client has met care plan goals with CSW services.  CSW to inform Josepha Pigg that [facility] discharged client from [facility] CSW services on 03/13/16.   CSW to fax physician case closure letter to Dr. Moshe Cipro informing Dr. Moshe Cipro that [facility] discharged client from [facility] CSW services on 03/13/16.  Norva Riffle.Deairra Halleck MSW, LCSW Licensed Clinical Social Worker [facility] Care Management [phone]

## 2016-03-15 ENCOUNTER — Other Ambulatory Visit: Payer: Self-pay | Admitting: Family Medicine

## 2016-03-23 ENCOUNTER — Other Ambulatory Visit: Payer: Self-pay | Admitting: Family Medicine

## 2016-04-01 ENCOUNTER — Ambulatory Visit: Payer: PPO | Admitting: Licensed Clinical Social Worker

## 2016-04-03 DIAGNOSIS — R7302 Impaired glucose tolerance (oral): Secondary | ICD-10-CM | POA: Diagnosis not present

## 2016-04-03 DIAGNOSIS — N400 Enlarged prostate without lower urinary tract symptoms: Secondary | ICD-10-CM | POA: Diagnosis not present

## 2016-04-03 DIAGNOSIS — R946 Abnormal results of thyroid function studies: Secondary | ICD-10-CM | POA: Diagnosis not present

## 2016-04-03 DIAGNOSIS — I10 Essential (primary) hypertension: Secondary | ICD-10-CM | POA: Diagnosis not present

## 2016-04-03 DIAGNOSIS — E785 Hyperlipidemia, unspecified: Secondary | ICD-10-CM | POA: Diagnosis not present

## 2016-04-03 LAB — COMPLETE METABOLIC PANEL WITH GFR
ALBUMIN: 3.7 g/dL (ref 3.6–5.1)
ALK PHOS: 59 U/L (ref 40–115)
ALT: 13 U/L (ref 9–46)
AST: 17 U/L (ref 10–35)
BUN: 10 mg/dL (ref 7–25)
CALCIUM: 9.2 mg/dL (ref 8.6–10.3)
CHLORIDE: 107 mmol/L (ref 98–110)
CO2: 28 mmol/L (ref 20–31)
Creat: 0.93 mg/dL (ref 0.70–1.25)
GFR, EST NON AFRICAN AMERICAN: 85 mL/min (ref >=60)
GFR, Est African American: >89 mL/min (ref >=60)
Glucose, Bld: 111 mg/dL — ABNORMAL HIGH (ref 65–99)
POTASSIUM: 4.3 mmol/L (ref 3.5–5.3)
Sodium: 144 mmol/L (ref 135–146)
Total Bilirubin: 0.3 mg/dL (ref 0.2–1.2)
Total Protein: 5.8 g/dL — ABNORMAL LOW (ref 6.1–8.1)

## 2016-04-03 LAB — LIPID PANEL
CHOL/HDL RATIO: 2.4 ratio (ref <5.0)
CHOLESTEROL: 185 mg/dL (ref <200)
HDL: 77 mg/dL (ref >40)
LDL Cholesterol: 97 mg/dL (ref <100)
Triglycerides: 54 mg/dL (ref <150)
VLDL: 11 mg/dL (ref <30)

## 2016-04-03 LAB — CBC
HEMATOCRIT: 40.8 % (ref 38.5–50.0)
HEMOGLOBIN: 13 g/dL — AB (ref 13.2–17.1)
MCH: 29.6 pg (ref 27.0–33.0)
MCHC: 31.9 g/dL — ABNORMAL LOW (ref 32.0–36.0)
MCV: 92.9 fL (ref 80.0–100.0)
MPV: 11.8 fL (ref 7.5–12.5)
Platelets: 196 10*3/uL (ref 140–400)
RBC: 4.39 MIL/uL (ref 4.20–5.80)
RDW: 12.7 % (ref 11.0–15.0)
WBC: 4.1 10*3/uL (ref 3.8–10.8)

## 2016-04-04 LAB — HEMOGLOBIN A1C
Hgb A1c MFr Bld: 5.7 % — ABNORMAL HIGH (ref <5.7)
MEAN PLASMA GLUCOSE: 117 mg/dL

## 2016-04-04 LAB — T4, FREE: Free T4: 1 ng/dL (ref 0.8–1.8)

## 2016-04-04 LAB — TSH: TSH: 3.2 m[IU]/L (ref 0.40–4.50)

## 2016-04-04 LAB — T3, FREE: T3 FREE: 2.7 pg/mL (ref 2.3–4.2)

## 2016-04-08 ENCOUNTER — Telehealth: Payer: Self-pay

## 2016-04-08 ENCOUNTER — Other Ambulatory Visit: Payer: Self-pay | Admitting: Family Medicine

## 2016-04-08 MED ORDER — PREDNISONE 5 MG (21) PO TBPK: 5.0000 mg | ORAL_TABLET | ORAL | 0 refills | Status: DC

## 2016-04-08 NOTE — Telephone Encounter (Signed)
Prednisone  Sent and I left him a message

## 2016-04-08 NOTE — Telephone Encounter (Signed)
States has had trouble breathing since yesterday and requesting prednisone be sent in

## 2016-04-11 ENCOUNTER — Encounter: Payer: Self-pay | Admitting: Family Medicine

## 2016-04-11 ENCOUNTER — Ambulatory Visit (INDEPENDENT_AMBULATORY_CARE_PROVIDER_SITE_OTHER): Payer: PPO | Admitting: Family Medicine

## 2016-04-11 VITALS — BP 136/82 | HR 112 | Temp 97.6°F | Resp 18 | Ht 67.0 in | Wt 138.1 lb

## 2016-04-11 DIAGNOSIS — Z0000 Encounter for general adult medical examination without abnormal findings: Secondary | ICD-10-CM | POA: Diagnosis not present

## 2016-04-11 DIAGNOSIS — Z1211 Encounter for screening for malignant neoplasm of colon: Secondary | ICD-10-CM

## 2016-04-11 LAB — POC HEMOCCULT BLD/STL (OFFICE/1-CARD/DIAGNOSTIC): FECAL OCCULT BLD: NEGATIVE

## 2016-04-11 NOTE — Patient Instructions (Signed)
f/u in September, call if you need me sooner  No changes inyour medications  Labs are good and exam is good  Thank you  for choosing [facility]. We consider it a privelige to serve you.  Delivering excellent health care in a caring and  compassionate way is our goal.  Partnering with you,  so that together we can achieve this goal is our strategy.

## 2016-04-11 NOTE — Progress Notes (Signed)
   [name]     [name]: [id number]      DOB: [birth year]   HPI: Patient is in for annual physical exam. No other health concerns are expressed or addressed at the visit. Recent labs, if available are reviewed. Immunization is reviewed , and  updated if needed.    PE; BP 136/82 (BP Location: Left Arm, Patient Position: Sitting, Cuff Size: Normal)   Pulse (!) 112   Temp 97.6 F (36.4 C) (Temporal)   Resp 18   Ht 5\' 7"  (1.702 m)   Wt 138 lb 1.9 oz (62.7 kg)   SpO2 95%   BMI 21.63 kg/m   Pleasant male, alert and oriented x 3, in no cardio-pulmonary distress. Afebrile. HEENT No facial trauma or asymetry. Sinuses non tender. EOMI, pupils equally reactive to light. External ears normal, tympanic membranes clear. Oropharynx moist, no exudate. Neck: supple, no adenopathy,JVD or thyromegaly.No bruits.  Chest: Clear to ascultation bilaterally.No crackles or wheezes.Decreased air entry bilaterally Non tender to palpation  Breast: No asymetry,no masses. No nipple discharge or inversion. No axillary or supraclavicular adenopathy  Cardiovascular system; Heart sounds normal,  S1 and  S2 ,no S3.  No murmur, or thrill. Apical beat not displaced Peripheral pulses normal.  Abdomen: Soft, non tender, no organomegaly or masses. No bruits. Bowel sounds normal. No guarding, tenderness or rebound.  Rectal:  Normal sphincter tone. No hemorrhoids or  masses. guaiac negative stool. Prostate smooth and firm    Musculoskeletal exam: Full ROM of spine, hips , shoulders and knees. No deformity ,swelling or crepitus noted. No muscle wasting or atrophy.   Neurologic: Cranial nerves 2 to 12 intact. Power, tone ,sensation and reflexes normal throughout. No disturbance in gait. No tremor.  Skin: Intact, no ulceration, erythema , scaling or rash noted. Pigmentation normal throughout  Psych; Normal mood and affect. Judgement and concentration normal   Assessment & Plan:  Annual  physical exam Annual exam as documented. Immunization and cancer screening needs are specifically addressed at this visit.

## 2016-04-11 NOTE — Assessment & Plan Note (Addendum)
Annual exam as documented. . Immunization and cancer screening needs are specifically addressed at this visit.  

## 2016-04-19 ENCOUNTER — Ambulatory Visit (INDEPENDENT_AMBULATORY_CARE_PROVIDER_SITE_OTHER): Payer: PPO | Admitting: Urology

## 2016-04-19 ENCOUNTER — Other Ambulatory Visit: Payer: Self-pay | Admitting: Family Medicine

## 2016-04-19 DIAGNOSIS — R972 Elevated prostate specific antigen [PSA]: Secondary | ICD-10-CM

## 2016-04-19 DIAGNOSIS — N400 Enlarged prostate without lower urinary tract symptoms: Secondary | ICD-10-CM | POA: Diagnosis not present

## 2016-06-26 DIAGNOSIS — E46 Unspecified protein-calorie malnutrition: Secondary | ICD-10-CM | POA: Diagnosis not present

## 2016-06-26 DIAGNOSIS — J449 Chronic obstructive pulmonary disease, unspecified: Secondary | ICD-10-CM | POA: Diagnosis not present

## 2016-06-26 DIAGNOSIS — I10 Essential (primary) hypertension: Secondary | ICD-10-CM | POA: Diagnosis not present

## 2016-07-22 ENCOUNTER — Other Ambulatory Visit: Payer: Self-pay | Admitting: Family Medicine

## 2016-08-12 ENCOUNTER — Other Ambulatory Visit: Payer: Self-pay

## 2016-08-12 DIAGNOSIS — J449 Chronic obstructive pulmonary disease, unspecified: Secondary | ICD-10-CM

## 2016-09-09 DIAGNOSIS — J449 Chronic obstructive pulmonary disease, unspecified: Secondary | ICD-10-CM | POA: Diagnosis not present

## 2016-09-12 DIAGNOSIS — J449 Chronic obstructive pulmonary disease, unspecified: Secondary | ICD-10-CM | POA: Diagnosis not present

## 2016-09-19 ENCOUNTER — Ambulatory Visit (INDEPENDENT_AMBULATORY_CARE_PROVIDER_SITE_OTHER): Payer: PPO | Admitting: Family Medicine

## 2016-09-19 ENCOUNTER — Encounter (INDEPENDENT_AMBULATORY_CARE_PROVIDER_SITE_OTHER): Payer: Self-pay

## 2016-09-19 VITALS — BP 130/70 | HR 78 | Resp 16 | Ht 67.0 in | Wt 140.0 lb

## 2016-09-19 DIAGNOSIS — Z23 Encounter for immunization: Secondary | ICD-10-CM

## 2016-09-19 DIAGNOSIS — I10 Essential (primary) hypertension: Secondary | ICD-10-CM | POA: Diagnosis not present

## 2016-09-19 DIAGNOSIS — E785 Hyperlipidemia, unspecified: Secondary | ICD-10-CM | POA: Diagnosis not present

## 2016-09-19 DIAGNOSIS — R972 Elevated prostate specific antigen [PSA]: Secondary | ICD-10-CM

## 2016-09-19 DIAGNOSIS — Z604 Social exclusion and rejection: Secondary | ICD-10-CM | POA: Diagnosis not present

## 2016-09-19 DIAGNOSIS — J449 Chronic obstructive pulmonary disease, unspecified: Secondary | ICD-10-CM | POA: Diagnosis not present

## 2016-09-19 NOTE — Patient Instructions (Addendum)
F/U eaarly March, call if you need me before  Flu vaccine today  Fasting lipid, cmp and EGFr first wek in October  Thank you  for choosing [facility]. We consider it a privelige to serve you.  Delivering excellent health care in a caring and  compassionate way is our goal.  Partnering with you,  so that together we can achieve this goal is our strategy  We will refer for colonscopy.

## 2016-09-19 NOTE — Progress Notes (Signed)
   [name]     [name]: [id number]      DOB: January 13, 1950   HPI [name] is here for follow up and re-evaluation of chronic medical conditions, medication management and review of any available recent lab and radiology data.  Preventive health is updated, specifically  Cancer screening and Immunization.   Questions or concerns regarding consultations or procedures which the PT has had in the interim are  addressed. The PT denies any adverse reactions to current medications since the last visit.  There are no new concerns.  There are no specific complaints   ROS Denies recent fever or chills. Denies sinus pressure, nasal congestion, ear pain or sore throat. Denies chest congestion, productive cough or wheezing. Denies chest pains, palpitations and leg swelling Denies abdominal pain, nausea, vomiting,diarrhea or constipation.   Denies dysuria, frequency, hesitancy or incontinence. Denies joint pain, swelling and limitation in mobility. Denies headaches, seizures, numbness, or tingling. Denies depression, anxiety or insomnia. Denies skin break down or rash.   PE There were no vitals taken for this visit.  BP 130/70   Pulse 78   Resp 16   Ht 5\' 7"  (1.702 m)   Wt 140 lb (63.5 kg)   SpO2 92%   BMI 21.93 kg/m    Patient alert and oriented and in no cardiopulmonary distress.  HEENT: No facial asymmetry, EOMI,   oropharynx pink and moist.  Neck supple no JVD, no mass.  Chest: Clear to auscultation bilaterally.  CVS: S1, S2 no murmurs, no S3.Regular rate.  ABD: Soft non tender.   Ext: No edema  MS: Adequate ROM spine, shoulders, hips and knees.  Skin: Intact, no ulcerations or rash noted.  Psych: Good eye contact, normal affect. Memory intact not anxious or depressed appearing.  CNS: CN 2-12 intact, power,  normal throughout.no focal deficits noted.   Assessment & Plan  Essential hypertension Controlled, no change in medication DASH diet and commitment to daily  physical activity for a minimum of 30 minutes discussed and encouraged, as a part of hypertension management. The importance of attaining a healthy weight is also discussed.  BP/Weight 09/19/2016 04/11/2016 03/04/2016 12/05/2015 08/30/2015 06/29/2015 7/86/7672  Systolic BP 094 709 628 366 294 765 465  Diastolic BP 70 82 84 68 70 80 74  Wt. (Lbs) 140 138.12 137.12 139 132.12 - 136  BMI 21.93 21.63 21.48 21.77 20.69 - 21.3       Hyperlipidemia Hyperlipidemia:Low fat diet discussed and encouraged. Updated lab needed at/ before next visit.    Lipid Panel  Lab Results  Component Value Date   CHOL 185 04/03/2016   HDL 77 04/03/2016   LDLCALC 97 04/03/2016   TRIG 54 04/03/2016   CHOLHDL 2.4 04/03/2016   Controlled, no change in medication     Social isolation Unchanged but appears to be doing well  PSA, INCREASED Followed by urology  Asthma with COPD (chronic obstructive pulmonary disease) Clinically stable, followed by pulmonary

## 2016-09-20 ENCOUNTER — Encounter: Payer: Self-pay | Admitting: Family Medicine

## 2016-09-20 NOTE — Assessment & Plan Note (Signed)
Unchanged but appears to be doing well

## 2016-09-20 NOTE — Assessment & Plan Note (Addendum)
Hyperlipidemia:Low fat diet discussed and encouraged. Updated lab needed at/ before next visit.    Lipid Panel  Lab Results  Component Value Date   CHOL 185 04/03/2016   HDL 77 04/03/2016   LDLCALC 97 04/03/2016   TRIG 54 04/03/2016   CHOLHDL 2.4 04/03/2016   Controlled, no change in medication

## 2016-09-20 NOTE — Assessment & Plan Note (Signed)
Controlled, no change in medication DASH diet and commitment to daily physical activity for a minimum of 30 minutes discussed and encouraged, as a part of hypertension management. The importance of attaining a healthy weight is also discussed.  BP/Weight 09/19/2016 04/11/2016 03/04/2016 12/05/2015 08/30/2015 06/29/2015 2/76/3943  Systolic BP 200 379 444 619 012 224 114  Diastolic BP 70 82 84 68 70 80 74  Wt. (Lbs) 140 138.12 137.12 139 132.12 - 136  BMI 21.93 21.63 21.48 21.77 20.69 - 21.3

## 2016-09-20 NOTE — Assessment & Plan Note (Signed)
Followed by urology.   

## 2016-09-20 NOTE — Assessment & Plan Note (Signed)
Clinically stable, followed by pulmonary

## 2016-09-26 ENCOUNTER — Other Ambulatory Visit: Payer: Self-pay | Admitting: Family Medicine

## 2016-09-26 NOTE — Telephone Encounter (Signed)
Seen [phone] 

## 2016-10-07 DIAGNOSIS — I10 Essential (primary) hypertension: Secondary | ICD-10-CM | POA: Diagnosis not present

## 2016-10-07 DIAGNOSIS — E785 Hyperlipidemia, unspecified: Secondary | ICD-10-CM | POA: Diagnosis not present

## 2016-10-07 DIAGNOSIS — J449 Chronic obstructive pulmonary disease, unspecified: Secondary | ICD-10-CM | POA: Diagnosis not present

## 2016-10-07 LAB — LIPID PANEL
Cholesterol: 172 mg/dL (ref <200)
HDL: 84 mg/dL (ref >40)
LDL CHOLESTEROL (CALC): 74 mg/dL
Non-HDL Cholesterol (Calc): 88 mg/dL (calc) (ref <130)
TRIGLYCERIDES: 62 mg/dL (ref <150)
Total CHOL/HDL Ratio: 2 (calc) (ref <5.0)

## 2016-10-07 LAB — COMPLETE METABOLIC PANEL WITH GFR
AG RATIO: 1.9 (calc) (ref 1.0–2.5)
ALBUMIN MSPROF: 4.3 g/dL (ref 3.6–5.1)
ALKALINE PHOSPHATASE (APISO): 54 U/L (ref 40–115)
ALT: 14 U/L (ref 9–46)
AST: 18 U/L (ref 10–35)
BUN: 13 mg/dL (ref 7–25)
CHLORIDE: 99 mmol/L (ref 98–110)
CO2: 32 mmol/L (ref 20–32)
Calcium: 9.6 mg/dL (ref 8.6–10.3)
Creat: 1.08 mg/dL (ref 0.70–1.25)
GFR, Est African American: 81 mL/min/{1.73_m2} (ref >=60)
GFR, Est Non African American: 70 mL/min/{1.73_m2} (ref >=60)
GLOBULIN: 2.3 g/dL (ref 1.9–3.7)
Glucose, Bld: 99 mg/dL (ref 65–99)
POTASSIUM: 3.7 mmol/L (ref 3.5–5.3)
Sodium: 138 mmol/L (ref 135–146)
Total Bilirubin: 0.8 mg/dL (ref 0.2–1.2)
Total Protein: 6.6 g/dL (ref 6.1–8.1)

## 2016-10-12 DIAGNOSIS — J449 Chronic obstructive pulmonary disease, unspecified: Secondary | ICD-10-CM | POA: Diagnosis not present

## 2016-10-28 DIAGNOSIS — J9611 Chronic respiratory failure with hypoxia: Secondary | ICD-10-CM | POA: Diagnosis not present

## 2016-10-28 DIAGNOSIS — J449 Chronic obstructive pulmonary disease, unspecified: Secondary | ICD-10-CM | POA: Diagnosis not present

## 2016-10-28 DIAGNOSIS — E46 Unspecified protein-calorie malnutrition: Secondary | ICD-10-CM | POA: Diagnosis not present

## 2016-10-28 DIAGNOSIS — I10 Essential (primary) hypertension: Secondary | ICD-10-CM | POA: Diagnosis not present

## 2016-11-07 DIAGNOSIS — J449 Chronic obstructive pulmonary disease, unspecified: Secondary | ICD-10-CM | POA: Diagnosis not present

## 2016-11-12 DIAGNOSIS — J449 Chronic obstructive pulmonary disease, unspecified: Secondary | ICD-10-CM | POA: Diagnosis not present

## 2016-12-09 DIAGNOSIS — J449 Chronic obstructive pulmonary disease, unspecified: Secondary | ICD-10-CM | POA: Diagnosis not present

## 2016-12-12 DIAGNOSIS — J449 Chronic obstructive pulmonary disease, unspecified: Secondary | ICD-10-CM | POA: Diagnosis not present

## 2017-01-02 ENCOUNTER — Other Ambulatory Visit: Payer: Self-pay | Admitting: Family Medicine

## 2017-01-12 DIAGNOSIS — J449 Chronic obstructive pulmonary disease, unspecified: Secondary | ICD-10-CM | POA: Diagnosis not present

## 2017-01-19 ENCOUNTER — Emergency Department (HOSPITAL_COMMUNITY): Payer: PPO

## 2017-01-19 ENCOUNTER — Emergency Department (HOSPITAL_COMMUNITY)
Admission: EM | Admit: 2017-01-19 | Discharge: 2017-01-19 | Disposition: A | Discharge status: Home / Self Care | Payer: PPO | Attending: Emergency Medicine | Admitting: Emergency Medicine

## 2017-01-19 ENCOUNTER — Other Ambulatory Visit: Payer: Self-pay

## 2017-01-19 ENCOUNTER — Encounter (HOSPITAL_COMMUNITY): Payer: Self-pay | Admitting: *Deleted

## 2017-01-19 DIAGNOSIS — Z79899 Other long term (current) drug therapy: Secondary | ICD-10-CM | POA: Diagnosis not present

## 2017-01-19 DIAGNOSIS — R05 Cough: Secondary | ICD-10-CM | POA: Diagnosis not present

## 2017-01-19 DIAGNOSIS — Z8673 Personal history of transient ischemic attack (TIA), and cerebral infarction without residual deficits: Secondary | ICD-10-CM | POA: Diagnosis not present

## 2017-01-19 DIAGNOSIS — Z87891 Personal history of nicotine dependence: Secondary | ICD-10-CM | POA: Insufficient documentation

## 2017-01-19 DIAGNOSIS — R0602 Shortness of breath: Secondary | ICD-10-CM | POA: Diagnosis not present

## 2017-01-19 DIAGNOSIS — Z7982 Long term (current) use of aspirin: Secondary | ICD-10-CM | POA: Diagnosis not present

## 2017-01-19 DIAGNOSIS — I10 Essential (primary) hypertension: Secondary | ICD-10-CM | POA: Diagnosis not present

## 2017-01-19 DIAGNOSIS — J45909 Unspecified asthma, uncomplicated: Secondary | ICD-10-CM | POA: Insufficient documentation

## 2017-01-19 DIAGNOSIS — J441 Chronic obstructive pulmonary disease with (acute) exacerbation: Secondary | ICD-10-CM

## 2017-01-19 DIAGNOSIS — Z591 Inadequate housing: Secondary | ICD-10-CM | POA: Diagnosis not present

## 2017-01-19 LAB — CBC
HCT: 42.9 % (ref 39.0–52.0)
Hemoglobin: 13.7 g/dL (ref 13.0–17.0)
MCH: 29.5 pg (ref 26.0–34.0)
MCHC: 31.9 g/dL (ref 30.0–36.0)
MCV: 92.5 fL (ref 78.0–100.0)
PLATELETS: 191 10*3/uL (ref 150–400)
RBC: 4.64 MIL/uL (ref 4.22–5.81)
RDW: 12.4 % (ref 11.5–15.5)
WBC: 4.4 10*3/uL (ref 4.0–10.5)

## 2017-01-19 LAB — BASIC METABOLIC PANEL
Anion gap: 10 (ref 5–15)
BUN: 12 mg/dL (ref 6–20)
CALCIUM: 9.6 mg/dL (ref 8.9–10.3)
CO2: 31 mmol/L (ref 22–32)
CREATININE: 1.01 mg/dL (ref 0.61–1.24)
Chloride: 102 mmol/L (ref 101–111)
GFR calc non Af Amer: >60 mL/min (ref >60)
GLUCOSE: 111 mg/dL — AB (ref 65–99)
Potassium: 3.6 mmol/L (ref 3.5–5.1)
Sodium: 143 mmol/L (ref 135–145)

## 2017-01-19 LAB — BRAIN NATRIURETIC PEPTIDE: B NATRIURETIC PEPTIDE 5: 7 pg/mL (ref 0.0–100.0)

## 2017-01-19 LAB — TROPONIN I: Troponin I: <0.03 ng/mL (ref <0.03)

## 2017-01-19 MED ORDER — PREDNISONE 50 MG PO TABS
60.0000 mg | ORAL_TABLET | Freq: Once | ORAL | Status: AC
  Administered 2017-01-19: 60 mg via ORAL
  Filled 2017-01-19: qty 1

## 2017-01-19 MED ORDER — ALBUTEROL SULFATE (2.5 MG/3ML) 0.083% IN NEBU
5.0000 mg | INHALATION_SOLUTION | Freq: Once | RESPIRATORY_TRACT | Status: AC
  Administered 2017-01-19: 5 mg via RESPIRATORY_TRACT
  Filled 2017-01-19: qty 6

## 2017-01-19 MED ORDER — DOXYCYCLINE HYCLATE 100 MG PO TABS: 100.0000 mg | ORAL_TABLET | Freq: Two times a day (BID) | ORAL | 0 refills | Status: DC

## 2017-01-19 MED ORDER — ALBUTEROL (5 MG/ML) CONTINUOUS INHALATION SOLN
10.0000 mg/h | INHALATION_SOLUTION | Freq: Once | RESPIRATORY_TRACT | Status: AC
  Administered 2017-01-19: 10 mg/h via RESPIRATORY_TRACT
  Filled 2017-01-19: qty 20

## 2017-01-19 MED ORDER — PREDNISONE 20 MG PO TABS: 40.0000 mg | ORAL_TABLET | Freq: Every day | ORAL | 0 refills | Status: DC

## 2017-01-19 MED ORDER — IPRATROPIUM BROMIDE 0.02 % IN SOLN
1.0000 mg | Freq: Once | RESPIRATORY_TRACT | Status: AC
  Administered 2017-01-19: 1 mg via RESPIRATORY_TRACT
  Filled 2017-01-19: qty 5

## 2017-01-19 NOTE — Discharge Instructions (Signed)
Take the prescriptions as directed.  Use your albuterol inhaler (2 to 4 puffs) or your albuterol nebulizer (1 unit dose) every 4 hours for the next 7 days, then as needed for cough, wheezing, or shortness of breath.  Call your regular medical doctor tomorrow morning to schedule a follow up appointment within the next 3 days.  Return to the Emergency Department immediately sooner if worsening.  ° °

## 2017-01-19 NOTE — ED Notes (Signed)
Pt ambulated with oxygen sat staying at 92%

## 2017-01-19 NOTE — ED Triage Notes (Addendum)
Pt has COPD and reports being SOB. Pt reports his power is out and isn't able to use "breathing machine." Pt has used inhaler with minimal relief. Pt states he walked about 2 blocks to get here. Denies any type of pain.

## 2017-01-19 NOTE — ED Provider Notes (Signed)
[facility] EMERGENCY DEPARTMENT Provider Note   CSN: [id number] Arrival date & time: 01/19/17  1956     History   Chief Complaint Chief Complaint  Patient presents with  . Shortness of Breath    HPI [name] is a 67 y.o. male.  HPI Pt was seen at 2040.  Per pt, c/o gradual onset and worsening of persistent cough and SOB for the past 2 to 3 days. Symptoms worsened today after his power went out and "I couldn't use my breathing machine." Pt has been using his home MDI without relief. Pt walked 2 blocks to the ED to seek treatment tonight.  Denies CP/palpitations, no back pain, no abd pain, no N/V/D, no fevers, no rash.    Past Medical History:  Diagnosis Date  . Asthma   . COPD (chronic obstructive pulmonary disease) ([place])   . CVA (cerebral vascular accident) ([place]) 2008   with temporary vision loss   . Depression   . Elevated PSA 2014   no diagnosis pf prostate cancer in 07/2014  . History of substance abuse 01/14/2011   Marijuana  . History of tobacco abuse 01/14/2011  . Hyperlipidemia   . Hypertension   . Marijuana abuse   . Nicotine addiction   . Prediabetes 2014    Patient Active Problem List   Diagnosis Date Noted  . Social isolation 03/17/2013  . Asthma with COPD (chronic obstructive pulmonary disease) ([place]) 11/12/2012  . History of substance abuse 01/14/2011  . Anemia 01/14/2011  . ECHOCARDIOGRAM, ABNORMAL 03/06/2010  . Vitamin D deficiency 06/28/2009  . IGT (impaired glucose tolerance) 01/25/2009  . PSA, INCREASED 01/25/2009  . Hyperlipidemia 07/30/2007  . Essential hypertension 07/30/2007    History reviewed. No pertinent surgical history.     Home Medications    Prior to Admission medications   Medication Sig Start Date End Date Taking? Authorizing Provider  ADVAIR DISKUS 500-50 MCG/DOSE AEPB INHALE 1 PUFF BY MOUTH TWICE A DAY. RINSE MOUTH AFTER USE. 09/26/16   Fayrene Helper, MD  albuterol (PROVENTIL) (2.5 MG/3ML) 0.083% nebulizer  solution TAKE 1 AMPULE (3 MLS) BY NEBULIZATION AS DIRECTED EVERY 4 HOURS AS NEEDED. 04/22/16   Fayrene Helper, MD  aspirin 81 MG tablet Take 81 mg by mouth daily.    [provider]  COMBIVENT RESPIMAT 20-100 MCG/ACT AERS respimat Take 1 puff by mouth every 6 (six) hours as needed.  05/12/15   [provider]  ipratropium (ATROVENT) 0.02 % nebulizer solution INHALE 1 VIAL VIA NEBULIZER EVERY 6-8 HOURS FOR WHEEZING. 04/22/16   Fayrene Helper, MD  pravastatin (PRAVACHOL) 40 MG tablet TAKE (1) TABLET BY MOUTH AT BEDTIME. 09/26/16   Fayrene Helper, MD  PROAIR HFA [phone] Base) MCG/ACT inhaler INHALE 2 PUFFS INTO THE LUNGS EVERY 4 HOURS AS NEEDED. 01/03/17   Fayrene Helper, MD  triamterene-hydrochlorothiazide (MAXZIDE-25) 37.5-25 MG tablet TAKE ONE TABLET BY MOUTH ONCE DAILY. 07/22/16   Fayrene Helper, MD    Family History History reviewed. No pertinent family history.  Social History Social History   Tobacco Use  . Smoking status: Former Smoker    Packs/day: 0.50    Years: 40.00    Pack years: 20.00    Types: Cigarettes    Last attempt to quit: 04/14/2015    Years since quitting: 1.7  . Smokeless tobacco: Never Used  Substance Use Topics  . Alcohol use: Yes    Alcohol/week: 1.8 oz    Types: 3 Cans  of beer per week    Comment: occasionally  . Drug use: No    Comment: pt quit 20 years ago     Allergies   Patient has no known allergies.   Review of Systems Review of Systems ROS: Statement: All systems negative except as marked or noted in the HPI; Constitutional: Negative for fever and chills. ; ; Eyes: Negative for eye pain, redness and discharge. ; ; ENMT: Negative for ear pain, hoarseness, nasal congestion, sinus pressure and sore throat. ; ; Cardiovascular: Negative for chest pain, palpitations, diaphoresis, and peripheral edema. ; ; Respiratory: +cough, SOB.  Negative for stridor. ; ; Gastrointestinal: Negative for nausea, vomiting, diarrhea,  abdominal pain, blood in stool, hematemesis, jaundice and rectal bleeding. . ; ; Genitourinary: Negative for dysuria, flank pain and hematuria. ; ; Musculoskeletal: Negative for back pain and neck pain. Negative for swelling and trauma.; ; Skin: Negative for pruritus, rash, abrasions, blisters, bruising and skin lesion.; ; Neuro: Negative for headache, lightheadedness and neck stiffness. Negative for weakness, altered level of consciousness, altered mental status, extremity weakness, paresthesias, involuntary movement, seizure and syncope.       Physical Exam Updated Vital Signs BP (!) 130/99   Pulse (!) 112   Temp 98.1 F (36.7 C) (Oral)   Resp (!) 25   SpO2 97%    BP 136/82   Pulse (!) 107   Temp 98.1 F (36.7 C) (Oral)   Resp (!) 21   SpO2 94%    Physical Exam 2045: Physical examination:  Nursing notes reviewed; Vital signs and O2 SAT reviewed;  Constitutional: Well developed, Well nourished, Well hydrated, In no acute distress; [name]:  Normocephalic, atraumatic; Eyes: EOMI, PERRL, No scleral icterus; ENMT: Mouth and pharynx normal, Mucous membranes moist; Neck: Supple, Full range of motion, No lymphadenopathy; Cardiovascular: Tachycardic rate and rhythm, No gallop; Respiratory: Breath sounds diminished & equal bilaterally, faint wheezes. No audible wheezing. Sitting upright. Mild tachypnea. Speaking full sentences, Normal respiratory effort/excursion; Chest: Nontender, Movement normal; Abdomen: Soft, Nontender, Nondistended, Normal bowel sounds; Genitourinary: No CVA tenderness; Extremities: Pulses normal, No tenderness, No edema, No calf edema or asymmetry.; Neuro: AA&Ox3, Major CN grossly intact.  Speech clear. No gross focal motor or sensory deficits in extremities.; Skin: Color normal, Warm, Dry.   ED Treatments / Results  Labs (all labs ordered are listed, but only abnormal results are displayed)   EKG  EKG Interpretation  Date/Time:  Sunday January 19 2017 20:31:19  EST Ventricular Rate:  114 PR Interval:    QRS Duration: 92 QT Interval:  337 QTC Calculation: 465 R Axis:   84 Text Interpretation:  Sinus tachycardia Probable left atrial enlargement Anteroseptal infarct, age indeterminate Baseline wander When compared with ECG of 05/30/2015 No significant change was found Confirmed by Francine Graven [phone]) on 01/19/2017 8:53:44 PM       Radiology   Procedures Procedures (including critical care time)  Medications Ordered in ED Medications  predniSONE (DELTASONE) tablet 60 mg (not administered)  albuterol (PROVENTIL,VENTOLIN) solution continuous neb (not administered)  ipratropium (ATROVENT) nebulizer solution 1 mg (not administered)  albuterol (PROVENTIL) (2.5 MG/3ML) 0.083% nebulizer solution 5 mg (5 mg Nebulization Given 01/19/17 2049)     Initial Impression / Assessment and Plan / ED Course  I have reviewed the triage vital signs and the nursing notes.  Pertinent labs & imaging results that were available during my care of the patient were reviewed by me and considered in my medical decision making (see chart for  details).  MDM Reviewed: previous chart, nursing note and vitals Reviewed previous: labs and ECG Interpretation: labs, ECG and x-ray   Results for orders placed or performed during the hospital encounter of 78/67/54  Basic metabolic panel  Result Value Ref Range   Sodium 143 135 - 145 mmol/L   Potassium 3.6 3.5 - 5.1 mmol/L   Chloride 102 101 - 111 mmol/L   CO2 31 22 - 32 mmol/L   Glucose, Bld 111 (H) 65 - 99 mg/dL   BUN 12 6 - 20 mg/dL   Creatinine, Ser 1.01 0.61 - 1.24 mg/dL   Calcium 9.6 8.9 - 10.3 mg/dL   GFR calc non Af Amer >60 >60 mL/min   GFR calc Af Amer >60 >60 mL/min   Anion gap 10 5 - 15  CBC  Result Value Ref Range   WBC 4.4 4.0 - 10.5 K/uL   RBC 4.64 4.22 - 5.81 MIL/uL   Hemoglobin 13.7 13.0 - 17.0 g/dL   HCT 42.9 39.0 - 52.0 %   MCV 92.5 78.0 - 100.0 fL   MCH 29.5 26.0 - 34.0 pg   MCHC 31.9  30.0 - 36.0 g/dL   RDW 12.4 11.5 - 15.5 %   Platelets 191 150 - 400 K/uL  Brain natriuretic peptide  Result Value Ref Range   B Natriuretic Peptide 7.0 0.0 - 100.0 pg/mL  Troponin I  Result Value Ref Range   Troponin I <0.03 <0.03 ng/mL   Dg Chest 2 View Result Date: 01/19/2017 CLINICAL DATA:  67 year old male with shortness of breath. EXAM: CHEST  2 VIEW COMPARISON:  Chest radiograph dated 05/30/2015 FINDINGS: There is emphysematous changes of the lungs. No focal consolidation, pleural effusion, or pneumothorax. The cardiac silhouette is within normal limits. No acute osseous pathology. IMPRESSION: 1. No acute cardiopulmonary process. 2. Emphysema. Electronically Signed   By: Anner Crete M.D.   On: 01/19/2017 22:37    2300: Pt states he "feels better" after neb and steroid.  NAD, lungs diminished but clear bilat, no wheezing, resps easy, speaking full sentences, Sats 92-94% R/A.  Pt ambulated around the ED with Sats remaining 92 % R/A, resps easy, NAD.  Offered admission, but pt states he wants to go home now. Tx symptomatically at this time. Dx and testing d/w pt.  Questions answered.  Verb understanding, agreeable to d/c home with outpt f/u.    Final Clinical Impressions(s) / ED Diagnoses   Final diagnoses:  None    ED Discharge Orders    None       Francine Graven, DO 01/21/17 1511

## 2017-01-19 NOTE — ED Notes (Signed)
Respiratory paged at [place] and this RN spoke to [place].

## 2017-01-21 ENCOUNTER — Ambulatory Visit (INDEPENDENT_AMBULATORY_CARE_PROVIDER_SITE_OTHER): Payer: PPO | Admitting: Family Medicine

## 2017-01-21 ENCOUNTER — Encounter: Payer: Self-pay | Admitting: Family Medicine

## 2017-01-21 VITALS — BP 120/82 | HR 86 | Resp 16 | Ht 67.0 in | Wt 140.8 lb

## 2017-01-21 DIAGNOSIS — E559 Vitamin D deficiency, unspecified: Secondary | ICD-10-CM | POA: Diagnosis not present

## 2017-01-21 DIAGNOSIS — L720 Epidermal cyst: Secondary | ICD-10-CM | POA: Insufficient documentation

## 2017-01-21 DIAGNOSIS — Z09 Encounter for follow-up examination after completed treatment for conditions other than malignant neoplasm: Secondary | ICD-10-CM

## 2017-01-21 DIAGNOSIS — E785 Hyperlipidemia, unspecified: Secondary | ICD-10-CM

## 2017-01-21 DIAGNOSIS — R7302 Impaired glucose tolerance (oral): Secondary | ICD-10-CM | POA: Diagnosis not present

## 2017-01-21 DIAGNOSIS — I10 Essential (primary) hypertension: Secondary | ICD-10-CM | POA: Diagnosis not present

## 2017-01-21 DIAGNOSIS — J449 Chronic obstructive pulmonary disease, unspecified: Secondary | ICD-10-CM | POA: Diagnosis not present

## 2017-01-21 DIAGNOSIS — D509 Iron deficiency anemia, unspecified: Secondary | ICD-10-CM | POA: Diagnosis not present

## 2017-01-21 MED ORDER — PREDNISONE 5 MG (21) PO TBPK: 5.0000 mg | ORAL_TABLET | ORAL | 0 refills | Status: AC

## 2017-01-21 NOTE — Assessment & Plan Note (Signed)
Present for over 3 years enlarging in size, not painful, in left mandibular area, refer surgery

## 2017-01-21 NOTE — Progress Notes (Signed)
   [name]     [name]: [id number]      DOB: Jan 31, 1950   HPI [name] is here for follow up of ED visit on 01/13 for COPD  Exacerbation when he lost power treated with neb x 2, pred 40 mg x 5 days and doxycycline 100 mg for 1 week, breathing has improved but still not back to baseline reportedly C/o  Painless swelling on left jaw , gradually increasing in size over the past several years, wants this removed if possible ROS Denies recent fever or chills. Denies sinus pressure, nasal congestion, ear pain or sore throat.  Denies chest pains, palpitations and leg swelling Denies abdominal pain, nausea, vomiting,diarrhea or constipation.   Denies dysuria, frequency, hesitancy or incontinence. Denies joint pain, swelling and limitation in mobility. Denies headaches, seizures, numbness, or tingling. Denies depression, anxiety or insomnia. Denies skin break down or rash.   PE  BP 120/82   Pulse 86   Resp 16   Ht 5\' 7"  (1.702 m)   Wt 140 lb 12.8 oz (63.9 kg)   SpO2 91%   BMI 22.05 kg/m   Patient alert and oriented and in mild cardiopulmonary distress.  HEENT: No facial asymmetry, EOMI,   oropharynx pink and moist.  Neck supple no JVD, cyst on left mandible non tender, max diameter approximately 3cm   Chest: decreased air entry with bilateral wheezes  CVS: S1, S2 no murmurs, no S3.Regular rate.  ABD: Soft non tender.   Ext: No edema  MS: Adequate ROM spine, shoulders, hips and knees.  Skin: Intact, no ulcerations or rash noted.  Psych: Good eye contact, normal affect. Memory intact not anxious or depressed appearing.  CNS: CN 2-12 intact, power,  normal throughout.no focal deficits noted.   Assessment & Plan  Epidermal cyst of face Present for over 3 years enlarging in size, not painful, in left mandibular area, refer surgery  Encounter for examination following treatment at hospital Improved following Ed visit on 01/19/2017 due to respiratory distress due to lack  of elcetricity and ability to use neb machine. Ill complete high dose oral steroid in next 2 days. Prednisone 5 mg dose pack to follow  Essential hypertension Controlled, no change in medication   Hyperlipidemia Hyperlipidemia:Low fat diet discussed and encouraged.   Lipid Panel  Lab Results  Component Value Date   CHOL 172 10/07/2016   HDL 84 10/07/2016   LDLCALC 97 04/03/2016   TRIG 62 10/07/2016   CHOLHDL 2.0 10/07/2016   Controlled, no change in medication    Asthma with COPD (chronic obstructive pulmonary disease) Currently uncontrolled with recent flare , gradually improving, prednisone 5 mg dose pack prescribed to follow the 40 mg prednisone 5 day boost prescribed in the ED

## 2017-01-21 NOTE — Patient Instructions (Signed)
F/u in March as before  You are referred to Dr Arnoldo Morale about cyst on left jaw  Start 5 mg prednisone dose pack for 6 days this Saturday after you finish the 20 mg tablets prescribed    Fasting lipid, cmp and eGFR and magnesium and HBa1C and vit D 5 days before March visit

## 2017-01-25 NOTE — Assessment & Plan Note (Addendum)
Currently uncontrolled with recent flare , gradually improving, prednisone 5 mg dose pack prescribed to follow the 40 mg prednisone 5 day boost prescribed in the ED

## 2017-01-25 NOTE — Assessment & Plan Note (Signed)
Controlled, no change in medication  

## 2017-01-25 NOTE — Assessment & Plan Note (Signed)
Hyperlipidemia:Low fat diet discussed and encouraged.   Lipid Panel  Lab Results  Component Value Date   CHOL 172 10/07/2016   HDL 84 10/07/2016   LDLCALC 97 04/03/2016   TRIG 62 10/07/2016   CHOLHDL 2.0 10/07/2016   Controlled, no change in medication

## 2017-01-25 NOTE — Assessment & Plan Note (Signed)
Improved following Ed visit on 01/19/2017 due to respiratory distress due to lack of elcetricity and ability to use neb machine. Ill complete high dose oral steroid in next 2 days. Prednisone 5 mg dose pack to follow

## 2017-01-28 ENCOUNTER — Other Ambulatory Visit: Payer: Self-pay | Admitting: Family Medicine

## 2017-02-07 ENCOUNTER — Other Ambulatory Visit: Payer: Self-pay | Admitting: Family Medicine

## 2017-02-12 DIAGNOSIS — J449 Chronic obstructive pulmonary disease, unspecified: Secondary | ICD-10-CM | POA: Diagnosis not present

## 2017-02-13 ENCOUNTER — Ambulatory Visit (INDEPENDENT_AMBULATORY_CARE_PROVIDER_SITE_OTHER): Payer: PPO | Admitting: General Surgery

## 2017-02-13 ENCOUNTER — Encounter: Payer: Self-pay | Admitting: General Surgery

## 2017-02-13 VITALS — BP 156/94 | HR 120 | Temp 98.7°F | Ht 67.0 in | Wt 139.0 lb

## 2017-02-13 DIAGNOSIS — L723 Sebaceous cyst: Secondary | ICD-10-CM | POA: Diagnosis not present

## 2017-02-13 NOTE — Patient Instructions (Signed)
Epidermal Cyst An epidermal cyst is a small, painless lump under your skin. It may be called an epidermal inclusion cyst or an infundibular cyst. The cyst contains a grayish-white, bad-smelling substance (keratin). It is important not to pop epidermal cysts yourself. These cysts are usually harmless (benign), but they can get infected. Symptoms of infection may include:  Redness.  Inflammation.  Tenderness.  Warmth.  Fever.  A grayish-white, bad-smelling substance draining from the cyst.  Pus draining from the cyst.  Follow these instructions at home:  Take over-the-counter and prescription medicines only as told by your doctor.  If you were prescribed an antibiotic, use it as told by your doctor. Do not stop using the antibiotic even if you start to feel better.  Keep the area around your cyst clean and dry.  Wear loose, dry clothing.  Do not try to pop your cyst.  Avoid touching your cyst.  Check your cyst every day for signs of infection.  Keep all follow-up visits as told by your doctor. This is important. How is this prevented?  Wear clean, dry, clothing.  Avoid wearing tight clothing.  Keep your skin clean and dry. Shower or take baths every day.  Wash your body with a benzoyl peroxide wash when you shower or bathe. Contact a health care provider if:  Your cyst has symptoms of infection.  Your condition is not improving or is getting worse.  You have a cyst that looks different from other cysts you have had.  You have a fever. Get help right away if:  Redness spreads from the cyst into the surrounding area. This information is not intended to replace advice given to you by your health care provider. Make sure you discuss any questions you have with your health care provider. Document Released: 02/01/2004 Document Revised: 08/23/2015 Document Reviewed: 10/26/2014 Elsevier Interactive Patient Education  2018 Elsevier Inc.  

## 2017-02-13 NOTE — Progress Notes (Signed)
[name]; [id number]; 02-01-1950   HPI Patient is a 67 year old black male who was referred to my care by Dr. Moshe Cipro for evaluation and treatment of a cyst on his face.  It has been present for some time now.  He states it developed after he had teeth extraction.  It has not drained.  He thinks it may be a little larger in size, but it does not cause him any pain. Past Medical History:  Diagnosis Date  . Asthma   . COPD (chronic obstructive pulmonary disease) ([place])   . CVA (cerebral vascular accident) ([place]) 2008   with temporary vision loss   . Depression   . Elevated PSA 2014   no diagnosis pf prostate cancer in 07/2014  . History of substance abuse 01/14/2011   Marijuana  . History of tobacco abuse 01/14/2011  . Hyperlipidemia   . Hypertension   . Marijuana abuse   . Nicotine addiction   . Prediabetes 2014    History reviewed. No pertinent surgical history.  History reviewed. No pertinent family history.  Current Outpatient Medications on File Prior to Visit  Medication Sig Dispense Refill  . ADVAIR DISKUS 500-50 MCG/DOSE AEPB INHALE 1 PUFF BY MOUTH TWICE A DAY. RINSE MOUTH AFTER USE. 60 each 0  . albuterol (PROVENTIL) (2.5 MG/3ML) 0.083% nebulizer solution TAKE 1 AMPULE (3 MLS) BY NEBULIZATION AS DIRECTED EVERY 4 HOURS AS NEEDED. 60 vial 5  . aspirin 81 MG tablet Take 81 mg by mouth daily.    . COMBIVENT RESPIMAT 20-100 MCG/ACT AERS respimat Take 1 puff by mouth every 6 (six) hours as needed.     . doxycycline (VIBRA-TABS) 100 MG tablet Take 1 tablet (100 mg total) by mouth 2 (two) times daily. 14 tablet 0  . ipratropium (ATROVENT) 0.02 % nebulizer solution INHALE 1 VIAL VIA NEBULIZER EVERY 6-8 HOURS FOR WHEEZING. 312.5 mL 5  . pravastatin (PRAVACHOL) 40 MG tablet TAKE (1) TABLET BY MOUTH AT BEDTIME. 90 tablet 1  . predniSONE (DELTASONE) 20 MG tablet Take 2 tablets (40 mg total) by mouth daily. 10 tablet 0  . PROAIR HFA 108 (90 Base) MCG/ACT inhaler INHALE 2 PUFFS INTO THE  LUNGS EVERY 4 HOURS AS NEEDED. 8.5 g 4  . triamterene-hydrochlorothiazide (MAXZIDE-25) 37.5-25 MG tablet TAKE (1) TABLET BY MOUTH ONCE DAILY. 90 tablet 0  . [DISCONTINUED] simvastatin (ZOCOR) 40 MG tablet Take 40 mg by mouth daily.     No current facility-administered medications on file prior to visit.     No Known Allergies  Social History   Substance and Sexual Activity  Alcohol Use Yes  . Alcohol/week: 1.8 oz  . Types: 3 Cans of beer per week   Comment: occasionally    Social History   Tobacco Use  Smoking Status Former Smoker  . Packs/day: 0.50  . Years: 40.00  . Pack years: 20.00  . Types: Cigarettes  . Last attempt to quit: 04/14/2015  . Years since quitting: 1.8  Smokeless Tobacco Never Used    Review of Systems  Constitutional: Negative.   HENT: Positive for sinus pain.   Eyes: Negative.   Respiratory: Positive for shortness of breath.   Cardiovascular: Negative.   Gastrointestinal: Negative.   Genitourinary: Negative.   Musculoskeletal: Negative.   Skin: Negative.   Neurological: Negative.   Endo/Heme/Allergies: Negative.   Psychiatric/Behavioral: Negative.     Objective   Vitals:   02/13/17 1136  BP: (!) 156/94  Pulse: (!) 120  Temp: 98.7 F (37.1  C)    Physical Exam  Constitutional: He is oriented to person, place, and time and well-developed, well-nourished, and in no distress.  HENT:  Head: Normocephalic and atraumatic.  1.5 cm oval epidermoid cyst noted along the left mandible.  It is mobile.  No drainage is noted.  Slightly tender to touch.  Cardiovascular: Regular rhythm and normal heart sounds. Exam reveals no gallop and no friction rub.  No murmur heard. Tachycardic.  Patient just showed up on moped.  Pulmonary/Chest: Effort normal and breath sounds normal. No respiratory distress. He has no wheezes. He has no rales.  Neurological: He is alert and oriented to person, place, and time.  Skin: Skin is warm and dry.  Vitals  reviewed.   Assessment  Sebaceous cyst, face Plan   We will remove the sebaceous cyst on his face once he can arrange transportation home.  The risks and benefits of the procedure including bleeding, infection, and recurrence of the cyst were fully explained to the patient, who gave informed consent.

## 2017-02-28 DIAGNOSIS — J9611 Chronic respiratory failure with hypoxia: Secondary | ICD-10-CM | POA: Diagnosis not present

## 2017-02-28 DIAGNOSIS — J449 Chronic obstructive pulmonary disease, unspecified: Secondary | ICD-10-CM | POA: Diagnosis not present

## 2017-02-28 DIAGNOSIS — E46 Unspecified protein-calorie malnutrition: Secondary | ICD-10-CM | POA: Diagnosis not present

## 2017-02-28 DIAGNOSIS — I10 Essential (primary) hypertension: Secondary | ICD-10-CM | POA: Diagnosis not present

## 2017-03-01 ENCOUNTER — Other Ambulatory Visit: Payer: Self-pay | Admitting: Family Medicine

## 2017-03-07 DIAGNOSIS — R7302 Impaired glucose tolerance (oral): Secondary | ICD-10-CM | POA: Diagnosis not present

## 2017-03-07 DIAGNOSIS — E785 Hyperlipidemia, unspecified: Secondary | ICD-10-CM | POA: Diagnosis not present

## 2017-03-07 DIAGNOSIS — I10 Essential (primary) hypertension: Secondary | ICD-10-CM | POA: Diagnosis not present

## 2017-03-07 DIAGNOSIS — E559 Vitamin D deficiency, unspecified: Secondary | ICD-10-CM | POA: Diagnosis not present

## 2017-03-08 LAB — COMPLETE METABOLIC PANEL WITH GFR
AG Ratio: 2 (calc) (ref 1.0–2.5)
ALBUMIN MSPROF: 4.1 g/dL (ref 3.6–5.1)
ALT: 12 U/L (ref 9–46)
AST: 16 U/L (ref 10–35)
Alkaline phosphatase (APISO): 60 U/L (ref 40–115)
BILIRUBIN TOTAL: 0.5 mg/dL (ref 0.2–1.2)
BUN: 13 mg/dL (ref 7–25)
CO2: 31 mmol/L (ref 20–32)
Calcium: 9.6 mg/dL (ref 8.6–10.3)
Chloride: 104 mmol/L (ref 98–110)
Creat: 1.16 mg/dL (ref 0.70–1.25)
GFR, EST AFRICAN AMERICAN: 75 mL/min/{1.73_m2} (ref >=60)
GFR, EST NON AFRICAN AMERICAN: 64 mL/min/{1.73_m2} (ref >=60)
GLOBULIN: 2.1 g/dL (ref 1.9–3.7)
Glucose, Bld: 97 mg/dL (ref 65–99)
POTASSIUM: 4.2 mmol/L (ref 3.5–5.3)
Sodium: 141 mmol/L (ref 135–146)
TOTAL PROTEIN: 6.2 g/dL (ref 6.1–8.1)

## 2017-03-08 LAB — HEMOGLOBIN A1C
Hgb A1c MFr Bld: 6.2 % of total Hgb — ABNORMAL HIGH (ref <5.7)
MEAN PLASMA GLUCOSE: 131 (calc)
eAG (mmol/L): 7.3 (calc)

## 2017-03-08 LAB — LIPID PANEL
CHOLESTEROL: 175 mg/dL (ref <200)
HDL: 63 mg/dL (ref >40)
LDL Cholesterol (Calc): 99 mg/dL (calc)
NON-HDL CHOLESTEROL (CALC): 112 mg/dL (ref <130)
TRIGLYCERIDES: 50 mg/dL (ref <150)
Total CHOL/HDL Ratio: 2.8 (calc) (ref <5.0)

## 2017-03-08 LAB — VITAMIN D 25 HYDROXY (VIT D DEFICIENCY, FRACTURES): VIT D 25 HYDROXY: 12 ng/mL — AB (ref 30–100)

## 2017-03-08 LAB — MAGNESIUM: Magnesium: 2 mg/dL (ref 1.5–2.5)

## 2017-03-12 ENCOUNTER — Ambulatory Visit (INDEPENDENT_AMBULATORY_CARE_PROVIDER_SITE_OTHER): Payer: PPO | Admitting: Family Medicine

## 2017-03-12 ENCOUNTER — Encounter: Payer: Self-pay | Admitting: Family Medicine

## 2017-03-12 ENCOUNTER — Other Ambulatory Visit: Payer: Self-pay | Admitting: Family Medicine

## 2017-03-12 VITALS — BP 130/80 | HR 94 | Temp 98.0°F | Resp 16 | Ht 67.0 in | Wt 143.8 lb

## 2017-03-12 DIAGNOSIS — Z0000 Encounter for general adult medical examination without abnormal findings: Secondary | ICD-10-CM | POA: Diagnosis not present

## 2017-03-12 DIAGNOSIS — J449 Chronic obstructive pulmonary disease, unspecified: Secondary | ICD-10-CM | POA: Diagnosis not present

## 2017-03-12 MED ORDER — ERGOCALCIFEROL 1.25 MG (50000 UT) PO CAPS: 50000.0000 [IU] | ORAL_CAPSULE | ORAL | 1 refills | Status: DC

## 2017-03-12 NOTE — Progress Notes (Signed)
Preventive Screening-Counseling & Management   Patient present here today for a Medicare annual wellness visit.   Current Problems (verified)   Medications Prior to Visit Allergies (verified)   PAST HISTORY  Family History: reviewed, unknown   Social History : Widower x 20 years, 1  Father of 3 children , he sees none of them. Smokes nothing, no pot or cigarettes , drinks 2 shots cognac twice per week Socially isolated now content, [employer] is his company   Risk Factors  Current exercise habits:  None, gets short winded, even when he walks In the house, even with ADL's  Dietary issues discussed:advised to eat a lot of vegetable and fruit and reduce fried and fatty foods and sugar   Cardiac risk factors: No known heart disease in family,2 brothers one in [place] , other brother the location is uknown, sister died 1 yearsago, cause unknown  Depression Screen  (Note: if answer to either of the following is "Yes", a more complete depression screening is indicated)   Over the past two weeks, have you felt down, depressed or hopeless? No  Over the past two weeks, have you felt little interest or pleasure in doing things? No  Have you lost interest or pleasure in daily life? No  Do you often feel hopeless? No  Do you cry easily over simple problems? No   Activities of Daily Living  In your present state of health, do you have any difficulty performing the following activities?  Driving?: yes, due to cramps in hands, stopped 15 years ago Managing money?: No Feeding yourself?:No Getting from bed to chair?:No Climbing a flight of stairs?:yes, short of breath Preparing food and eating?:No Bathing or showering?:yes , short of brearth Getting dressed?:No Getting to the toilet?:No Using the toilet?:No Moving around from place to place?: yes  If in acute breathing distress  Fall Risk Assessment In the past year have you fallen or had a near fall?:No Are you currently taking any medications  that make you dizzy?:No   Hearing Difficulties: No Do you often ask people to speak up or repeat themselves?:No Do you experience ringing or noises in your ears?:No Do you have difficulty understanding soft or whispered voices?:No  Cognitive Testing  Alert? Yes Normal Appearance?Yes  Oriented to person? Yes Place? Yes  Time? Yes  Displays appropriate judgment?Yes  Can read the correct time from a watch face? yes Are you having problems remembering things?No  Advanced Directives have been discussed with the patient?Yes , full code   List the Names of Other Physician/Practitioners you currently use:    Indicate any recent Medical Services you may have received from other than [facility] providers in the past year (date may be approximate).     Medicare Attestation  I have personally reviewed:  The patient's medical and social history  Their use of alcohol, tobacco or illicit drugs  Their current medications and supplements  The patient's functional ability including ADLs,fall risks, home safety risks, cognitive, and hearing and visual impairment  Diet and physical activities  Evidence for depression or mood disorders  The patient's weight, height, BMI, and visual acuity have been recorded in the chart. I have made referrals, counseling, and provided education to the patient based on review of the above and I have provided the patient with a written personalized care plan for preventive services.    Physical Exam BP 130/80   Pulse 94   Temp 98 F (36.7 C) (Temporal)   Resp 16   Ht  5\' 7"  (1.702 m)   Wt 143 lb 12 oz (65.2 kg)   SpO2 93%   BMI 22.51 kg/m      Assessment & Plan:  Medicare annual wellness visit, subsequent Annual exam as documented. m. Immunization and cancer screening needs are specifically addressed at this visit.Need to get help to arrange for colonoscopy which is past due

## 2017-03-12 NOTE — Patient Instructions (Signed)
Physical exam with MD in 5 moonths, call if you need me sooner  Start once weekly vitamin D   Thank you  for choosing [facility]. We consider it a privelige to serve you.  Delivering excellent health care in a caring and  compassionate way is our goal.  Partnering with you,  so that together we can achieve this goal is our strategy.

## 2017-03-13 ENCOUNTER — Encounter: Payer: Self-pay | Admitting: Family Medicine

## 2017-03-13 ENCOUNTER — Telehealth: Payer: Self-pay | Admitting: Family Medicine

## 2017-03-13 DIAGNOSIS — Z1211 Encounter for screening for malignant neoplasm of colon: Secondary | ICD-10-CM

## 2017-03-13 NOTE — Telephone Encounter (Signed)
Can we get help to barrage for his colonoscopy, past due , and I am sure he will need social worker to assist, let's discuss and see how we can make this happen please, send me back message when you get info, thanks! ( I have not referred yet, just waiting on logistic)

## 2017-03-13 NOTE — Assessment & Plan Note (Signed)
Annual exam as documented. m. Immunization and cancer screening needs are specifically addressed at this visit.Need to get help to arrange for colonoscopy which is past due

## 2017-03-18 NOTE — Telephone Encounter (Signed)
Sent a message to [place] at [facility] for assistance in this matter

## 2017-03-27 ENCOUNTER — Encounter (INDEPENDENT_AMBULATORY_CARE_PROVIDER_SITE_OTHER): Payer: Self-pay | Admitting: *Deleted

## 2017-03-27 NOTE — Addendum Note (Signed)
Addended by: Eual Fines on: 03/27/2017 10:08 AM   Modules accepted: Orders

## 2017-03-27 NOTE — Telephone Encounter (Signed)
Referred to [facility] aware of the need to assist him with setup

## 2017-03-28 ENCOUNTER — Other Ambulatory Visit: Payer: Self-pay | Admitting: *Deleted

## 2017-03-28 NOTE — Patient Outreach (Signed)
[facility]) Care Management  03/28/2017  CEBERT DETTMANN [birth year] [id number]  Direct outreach received from Dr. [name] regarding Mr. Neville needs around transportation (upcoming colonoscopy appointment) and social isolation. I have referred him to our social work team for follow up.   Plan: LCSW referral.    [facility]  [phone]

## 2017-03-28 NOTE — Patient Outreach (Signed)
[facility]) Care Management  03/28/2017  AWESOME JARED April 08, 1950 [id number]   CSW had received referral from Dr. Griffin Dakin office regarding need for colonoscopy and figuring out the logistics (transportation, etc). CSW called & spoke with patient - scheduled home visit for Mon, 4/1 at 12:30p. Full assessment & note to follow.    Raynaldo Opitz, LCSW [facility]  Clinical Social Worker cell #: [phone]

## 2017-04-07 ENCOUNTER — Other Ambulatory Visit: Payer: Self-pay | Admitting: *Deleted

## 2017-04-11 NOTE — Patient Outreach (Signed)
[facility]) Care Management  04/11/2017  GERHARDT GLEED 24-Sep-1950 [id number]   CSW had received referral from Dr. Joycelyn Schmid Simpson's office, forwarded by [facility] RNCM, [name] that has chronic health conditions that are fairly well managed;  however, he is experiencing social isolation and lack of support for various health related needs. Patient has upcoming colonoscopy appointment and no transportation nor aftercare. CSW met with patient in his home to assess needs - patient states that he is a widower of 20 years, has 3 children but does not have any contact with them as he has a past history of substance abuse, which patient was not forthcoming with information on at first but finally admitted that as the reasoning why. Patient states that he has had a colonoscopy in the past in which he did not require someone to be there afterwards and did not see why he would need one now. CSW explained that it is likely the practice's policy.  CSW attempted to speak with patient about social isolation, whether he would be interested in going to the senior center or enrolling in silver sneakers and going to the [facility] but patient declined stating that he is content with staying at his home and does not see any other needs or assistance at this time.   CSW will check with [facility] leadership for guidance on arranging aftercare & transportation for colonoscopy.    Raynaldo Opitz, LCSW [facility]  Clinical Social Worker cell #: [phone]

## 2017-04-12 DIAGNOSIS — J449 Chronic obstructive pulmonary disease, unspecified: Secondary | ICD-10-CM | POA: Diagnosis not present

## 2017-04-14 ENCOUNTER — Other Ambulatory Visit: Payer: Self-pay | Admitting: Family Medicine

## 2017-04-28 ENCOUNTER — Other Ambulatory Visit: Payer: Self-pay | Admitting: Family Medicine

## 2017-04-28 ENCOUNTER — Telehealth: Payer: Self-pay | Admitting: Family Medicine

## 2017-04-28 MED ORDER — PREDNISONE 10 MG (21) PO TBPK: ORAL_TABLET | ORAL | 0 refills | Status: DC

## 2017-04-28 NOTE — Telephone Encounter (Signed)
Patient is requesting a med call in for methylPREDNISolone acetate (DEPO-MEDROL) injection 80 mg  [[id number]]

## 2017-04-28 NOTE — Telephone Encounter (Signed)
Sending it right now

## 2017-04-28 NOTE — Progress Notes (Signed)
pred 

## 2017-04-28 NOTE — Telephone Encounter (Signed)
Patient states he's having a COPD flare and requesting prednisone dose pack be sent to his pharmacy

## 2017-04-29 ENCOUNTER — Other Ambulatory Visit: Payer: Self-pay

## 2017-04-29 MED ORDER — ALBUTEROL SULFATE HFA 108 (90 BASE) MCG/ACT IN AERS: 2.0000 | INHALATION_SPRAY | RESPIRATORY_TRACT | 5 refills | Status: DC | PRN

## 2017-04-30 ENCOUNTER — Encounter: Payer: Self-pay | Admitting: Gastroenterology

## 2017-04-30 ENCOUNTER — Other Ambulatory Visit: Payer: Self-pay | Admitting: Family Medicine

## 2017-05-03 DIAGNOSIS — R69 Illness, unspecified: Secondary | ICD-10-CM | POA: Diagnosis not present

## 2017-05-08 ENCOUNTER — Other Ambulatory Visit: Payer: Self-pay | Admitting: *Deleted

## 2017-05-08 NOTE — Patient Outreach (Signed)
[facility]) Care Management  05/08/2017  HAWTHORNE DAY [birth year] [id number]   CSW called & spoke with patient who confirms that the appointment for his colonoscopy has been scheduled for Monday, 07/21/17 at [place] [place] called & spoke with the receptionist at [facility] (phone #: [phone]) to confirm that patient would need to be there an hour early (7am) with someone there with him to transport him home afterwards and that someone would likely only need to be with him for 3-4 hours. CSW to check with [facility] leadership again for guidance as patient states that he has no family or friends as he has unfortunately burnt all bridges due to history of substance abuse and is unable to pay for personal care services to stay with him during his after-care.    Raynaldo Opitz, LCSW [facility]  Clinical Social Worker cell #: [phone]

## 2017-05-09 ENCOUNTER — Other Ambulatory Visit: Payer: Self-pay | Admitting: Family Medicine

## 2017-05-12 ENCOUNTER — Other Ambulatory Visit: Payer: Self-pay

## 2017-05-12 DIAGNOSIS — J449 Chronic obstructive pulmonary disease, unspecified: Secondary | ICD-10-CM | POA: Diagnosis not present

## 2017-05-12 MED ORDER — TRIAMTERENE-HCTZ 37.5-25 MG PO TABS: ORAL_TABLET | ORAL | 1 refills | Status: DC

## 2017-05-15 ENCOUNTER — Other Ambulatory Visit: Payer: Self-pay | Admitting: Family Medicine

## 2017-05-15 ENCOUNTER — Other Ambulatory Visit: Payer: Self-pay

## 2017-05-15 MED ORDER — FLUTICASONE-SALMETEROL 500-50 MCG/DOSE IN AEPB: INHALATION_SPRAY | RESPIRATORY_TRACT | 5 refills | Status: DC

## 2017-05-29 ENCOUNTER — Other Ambulatory Visit: Payer: Self-pay | Admitting: *Deleted

## 2017-05-29 NOTE — Patient Outreach (Signed)
[facility]) Care Management  05/29/2017  [name] 02-11-1950 [id number]   CSW called & spoke with patient regarding his upcoming appointment for colonoscopy scheduled for 07/21/17. Patient states that he still has not come up with a contact for family or friends that could provide care for him after his colonoscopy. CSW sent message to Dr. [name] & her nurse, Kate Sable to inform them that as of right now, has no family support as he has burnt bridges with all his friends and family (he wouldn't go into detail but my guess is it has something to do with his history of substance abuse). Patient refused to let CSW attempt to contact any of his family.   CSW spoke with [facility] leadership about getting a contract with [facility] [place] for aide services as [facility] has in [facility] but unfortunately as of now, there isn't a contract with [facility]. CSW will keep patient on my radar until that happens but until then [facility] would only be able to provide transportation and not someone to stay with him for the after-care. CSW confirmed with [facility] that it is their policy that he MUST have someone with him to provide transportation & care afterwards for 3-5 hours.   CSW awaiting response from Dr. Moshe Cipro regarding whether she wanted CSW to call & cancel his appointment at [facility] for 7/15.   Raynaldo Opitz, LCSW [facility]  Clinical Social Worker cell #: [phone]

## 2017-05-30 ENCOUNTER — Other Ambulatory Visit: Payer: Self-pay | Admitting: Family Medicine

## 2017-06-03 ENCOUNTER — Other Ambulatory Visit: Payer: Self-pay | Admitting: Family Medicine

## 2017-06-12 DIAGNOSIS — J449 Chronic obstructive pulmonary disease, unspecified: Secondary | ICD-10-CM | POA: Diagnosis not present

## 2017-06-16 ENCOUNTER — Ambulatory Visit: Payer: Self-pay | Admitting: *Deleted

## 2017-06-19 ENCOUNTER — Other Ambulatory Visit: Payer: Self-pay | Admitting: *Deleted

## 2017-06-20 NOTE — Patient Outreach (Addendum)
[facility]) Care Management  06/20/2017  [name] 02-01-1950 [id number]   CSW received message back from Dr. Moshe Cipro informing to cancel his upcoming appointment with [facility] phone #: [phone] on 07/21/17 at 8am as patient has no after-care arranged, nor does he have anyone that he is able to ask. At this time, [place] does not have any contracts with private duty caregivers either who would be able to provide that service. CSW will keep patient on radar when and if that does become available. Patient made aware & CSW will sign off for now as no further CSW needs identified at this time.    Raynaldo Opitz, LCSW [facility]  Clinical Social Worker cell #: [phone]

## 2017-06-27 DIAGNOSIS — J9611 Chronic respiratory failure with hypoxia: Secondary | ICD-10-CM | POA: Diagnosis not present

## 2017-06-27 DIAGNOSIS — I10 Essential (primary) hypertension: Secondary | ICD-10-CM | POA: Diagnosis not present

## 2017-06-27 DIAGNOSIS — J441 Chronic obstructive pulmonary disease with (acute) exacerbation: Secondary | ICD-10-CM | POA: Diagnosis not present

## 2017-07-12 DIAGNOSIS — J449 Chronic obstructive pulmonary disease, unspecified: Secondary | ICD-10-CM | POA: Diagnosis not present

## 2017-07-14 ENCOUNTER — Other Ambulatory Visit: Payer: Self-pay | Admitting: Family Medicine

## 2017-07-21 ENCOUNTER — Ambulatory Visit: Payer: Self-pay | Admitting: Gastroenterology

## 2017-07-26 ENCOUNTER — Other Ambulatory Visit: Payer: Self-pay | Admitting: Family Medicine

## 2017-07-28 DIAGNOSIS — R69 Illness, unspecified: Secondary | ICD-10-CM | POA: Diagnosis not present

## 2017-08-09 ENCOUNTER — Other Ambulatory Visit: Payer: Self-pay | Admitting: Family Medicine

## 2017-08-12 ENCOUNTER — Ambulatory Visit (INDEPENDENT_AMBULATORY_CARE_PROVIDER_SITE_OTHER): Payer: Medicare HMO | Admitting: Family Medicine

## 2017-08-12 ENCOUNTER — Encounter: Payer: Self-pay | Admitting: Family Medicine

## 2017-08-12 VITALS — BP 142/82 | HR 66 | Resp 16 | Ht 67.0 in | Wt 139.0 lb

## 2017-08-12 DIAGNOSIS — R972 Elevated prostate specific antigen [PSA]: Secondary | ICD-10-CM

## 2017-08-12 DIAGNOSIS — J449 Chronic obstructive pulmonary disease, unspecified: Secondary | ICD-10-CM | POA: Diagnosis not present

## 2017-08-12 DIAGNOSIS — E785 Hyperlipidemia, unspecified: Secondary | ICD-10-CM

## 2017-08-12 DIAGNOSIS — I10 Essential (primary) hypertension: Secondary | ICD-10-CM | POA: Diagnosis not present

## 2017-08-12 MED ORDER — PRAVASTATIN SODIUM 40 MG PO TABS: 40.0000 mg | ORAL_TABLET | Freq: Every day | ORAL | 3 refills | Status: DC

## 2017-08-12 MED ORDER — TRIAMTERENE-HCTZ 37.5-25 MG PO TABS: ORAL_TABLET | ORAL | 3 refills | Status: DC

## 2017-08-12 NOTE — Patient Instructions (Addendum)
Annual physical exam exam with MD in Novemebr , early, call if you need me before  INCREASE BP  pill to ONE and a HALF tablets every morning  Fasting lipid, cmp and EGFR, hBa1C and TSH 1 week before November visit Thank you  for choosing [facility]. We consider it a privelige to serve you.  Delivering excellent health care in a caring and  compassionate way is our goal.  Partnering with you,  so that together we can achieve this goal is our strategy.

## 2017-08-15 ENCOUNTER — Other Ambulatory Visit: Payer: Self-pay | Admitting: Family Medicine

## 2017-08-17 ENCOUNTER — Encounter: Payer: Self-pay | Admitting: Family Medicine

## 2017-08-17 NOTE — Progress Notes (Signed)
   [name]     [name]: [id number]      DOB: [birth year]   HPI [name] is here for follow up and re-evaluation of chronic medical conditions, medication management and review of any available recent lab and radiology data.  Preventive health is updated, specifically  Cancer screening and Immunization.   Questions or concerns regarding consultations or procedures which the PT has had in the interim are  addressed. The PT denies any adverse reactions to current medications since the last visit.  There are no new concerns.  There are no specific complaints   ROS Denies recent fever or chills. Denies sinus pressure, nasal congestion, ear pain or sore throat. Denies chest congestion, productive cough does have intermittent  wheezing. Denies chest pains, palpitations and leg swelling Denies abdominal pain, nausea, vomiting,diarrhea or constipation.   Denies dysuria, frequency, hesitancy or incontinence. Denies joint pain, swelling and limitation in mobility. Denies headaches, seizures, numbness, or tingling. Denies depression, anxiety or insomnia. Denies skin break down or rash.   PE  BP (!) 142/82   Pulse 66   Resp 16   Ht 5\' 7"  (1.702 m)   Wt 139 lb (63 kg)   SpO2 93%   BMI 21.77 kg/m   Patient alert and oriented and in no cardiopulmonary distress.  HEENT: No facial asymmetry, EOMI,   oropharynx pink and moist.  Neck supple no JVD, no mass.  Chest: Clear to auscultation bilaterally.Decreased air entry throughout  CVS: S1, S2 no murmurs, no S3.Regular rate.  ABD: Soft non tender.   Ext: No edema  MS: Adequate ROM spine, shoulders, hips and knees.  Skin: Intact, no ulcerations or rash noted.  Psych: Good eye contact, normal affect. Memory intact not anxious or depressed appearing.  CNS: CN 2-12 intact, power,  normal throughout.no focal deficits noted.   Assessment & Plan  Essential hypertension Uncontrolled increase dose of maxzide DASH diet and commitment  to daily physical activity for a minimum of 30 minutes discussed and encouraged, as a part of hypertension management. The importance of attaining a healthy weight is also discussed.  BP/Weight 08/12/2017 03/12/2017 02/13/2017 01/21/2017 01/19/2017 4/56/2563 08/15/3732  Systolic BP 287 681 157 262 035 597 416  Diastolic BP 82 80 94 82 88 70 82  Wt. (Lbs) 139 143.75 139 140.8 - 140 138.12  BMI 21.77 22.51 21.77 22.05 - 21.93 21.63       Hyperlipidemia Hyperlipidemia:Low fat diet discussed and encouraged.   Lipid Panel  Lab Results  Component Value Date   CHOL 175 03/07/2017   HDL 63 03/07/2017   LDLCALC 99 03/07/2017   TRIG 50 03/07/2017   CHOLHDL 2.8 03/07/2017    Controlled, no change in medication Updated lab needed at/ before next visit.    Asthma with COPD (chronic obstructive pulmonary disease) Stable currently and followed by pulmonary as well  PSA, INCREASED Followed by urology and has an upcoming  appointment

## 2017-08-17 NOTE — Assessment & Plan Note (Signed)
Followed by urology and has an upcoming  appointment

## 2017-08-17 NOTE — Assessment & Plan Note (Signed)
Uncontrolled increase dose of maxzide DASH diet and commitment to daily physical activity for a minimum of 30 minutes discussed and encouraged, as a part of hypertension management. The importance of attaining a healthy weight is also discussed.  BP/Weight 08/12/2017 03/12/2017 02/13/2017 01/21/2017 01/19/2017 3/53/2992 04/08/6832  Systolic BP 196 222 979 892 119 417 408  Diastolic BP 82 80 94 82 88 70 82  Wt. (Lbs) 139 143.75 139 140.8 - 140 138.12  BMI 21.77 22.51 21.77 22.05 - 21.93 21.63

## 2017-08-17 NOTE — Assessment & Plan Note (Signed)
Stable currently and followed by pulmonary as well

## 2017-08-17 NOTE — Assessment & Plan Note (Signed)
Hyperlipidemia:Low fat diet discussed and encouraged.   Lipid Panel  Lab Results  Component Value Date   CHOL 175 03/07/2017   HDL 63 03/07/2017   LDLCALC 99 03/07/2017   TRIG 50 03/07/2017   CHOLHDL 2.8 03/07/2017    Controlled, no change in medication Updated lab needed at/ before next visit.

## 2017-08-19 DIAGNOSIS — J441 Chronic obstructive pulmonary disease with (acute) exacerbation: Secondary | ICD-10-CM | POA: Diagnosis not present

## 2017-08-20 DIAGNOSIS — J441 Chronic obstructive pulmonary disease with (acute) exacerbation: Secondary | ICD-10-CM | POA: Diagnosis not present

## 2017-08-20 DIAGNOSIS — I10 Essential (primary) hypertension: Secondary | ICD-10-CM | POA: Diagnosis not present

## 2017-08-20 DIAGNOSIS — J9611 Chronic respiratory failure with hypoxia: Secondary | ICD-10-CM | POA: Diagnosis not present

## 2017-08-29 ENCOUNTER — Ambulatory Visit (INDEPENDENT_AMBULATORY_CARE_PROVIDER_SITE_OTHER): Payer: Medicare HMO

## 2017-08-29 DIAGNOSIS — Z23 Encounter for immunization: Secondary | ICD-10-CM | POA: Diagnosis not present

## 2017-09-01 ENCOUNTER — Ambulatory Visit: Payer: Self-pay

## 2017-09-11 DIAGNOSIS — E785 Hyperlipidemia, unspecified: Secondary | ICD-10-CM | POA: Diagnosis not present

## 2017-09-11 DIAGNOSIS — I10 Essential (primary) hypertension: Secondary | ICD-10-CM | POA: Diagnosis not present

## 2017-09-11 DIAGNOSIS — K08109 Complete loss of teeth, unspecified cause, unspecified class: Secondary | ICD-10-CM | POA: Diagnosis not present

## 2017-09-11 DIAGNOSIS — Z87891 Personal history of nicotine dependence: Secondary | ICD-10-CM | POA: Diagnosis not present

## 2017-09-11 DIAGNOSIS — R69 Illness, unspecified: Secondary | ICD-10-CM | POA: Diagnosis not present

## 2017-09-11 DIAGNOSIS — Z7982 Long term (current) use of aspirin: Secondary | ICD-10-CM | POA: Diagnosis not present

## 2017-09-11 DIAGNOSIS — Z7951 Long term (current) use of inhaled steroids: Secondary | ICD-10-CM | POA: Diagnosis not present

## 2017-09-11 DIAGNOSIS — J449 Chronic obstructive pulmonary disease, unspecified: Secondary | ICD-10-CM | POA: Diagnosis not present

## 2017-09-11 DIAGNOSIS — I951 Orthostatic hypotension: Secondary | ICD-10-CM | POA: Diagnosis not present

## 2017-09-17 ENCOUNTER — Other Ambulatory Visit: Payer: Self-pay | Admitting: Family Medicine

## 2017-09-18 ENCOUNTER — Other Ambulatory Visit: Payer: Self-pay

## 2017-09-18 ENCOUNTER — Telehealth: Payer: Self-pay | Admitting: Family Medicine

## 2017-09-18 MED ORDER — FLUTICASONE-SALMETEROL 500-50 MCG/DOSE IN AEPB: INHALATION_SPRAY | RESPIRATORY_TRACT | 5 refills | Status: DC

## 2017-09-18 NOTE — Telephone Encounter (Signed)
Pt stopped by to drop off screening copy for Dr , and to get Advair 500/50 refill sent to [facility]

## 2017-09-18 NOTE — Telephone Encounter (Signed)
Refill sent.

## 2017-10-03 ENCOUNTER — Other Ambulatory Visit: Payer: Self-pay | Admitting: Family Medicine

## 2017-10-03 DIAGNOSIS — R69 Illness, unspecified: Secondary | ICD-10-CM | POA: Diagnosis not present

## 2017-10-04 DIAGNOSIS — R69 Illness, unspecified: Secondary | ICD-10-CM | POA: Diagnosis not present

## 2017-10-17 ENCOUNTER — Other Ambulatory Visit: Payer: Self-pay | Admitting: Family Medicine

## 2017-10-17 DIAGNOSIS — J449 Chronic obstructive pulmonary disease, unspecified: Secondary | ICD-10-CM | POA: Diagnosis not present

## 2017-10-17 DIAGNOSIS — J9611 Chronic respiratory failure with hypoxia: Secondary | ICD-10-CM | POA: Diagnosis not present

## 2017-10-20 ENCOUNTER — Telehealth: Payer: Self-pay | Admitting: Family Medicine

## 2017-10-20 ENCOUNTER — Other Ambulatory Visit: Payer: Self-pay

## 2017-10-20 MED ORDER — ALBUTEROL SULFATE HFA 108 (90 BASE) MCG/ACT IN AERS: INHALATION_SPRAY | RESPIRATORY_TRACT | 5 refills | Status: DC

## 2017-10-20 NOTE — Telephone Encounter (Signed)
Needs inhaler refill - out needs asap

## 2017-10-23 NOTE — Telephone Encounter (Signed)
Called and confirmed- pt received his inhaler

## 2017-11-19 ENCOUNTER — Ambulatory Visit (INDEPENDENT_AMBULATORY_CARE_PROVIDER_SITE_OTHER): Payer: Medicare HMO | Admitting: Family Medicine

## 2017-11-19 ENCOUNTER — Encounter: Payer: Self-pay | Admitting: Family Medicine

## 2017-11-19 VITALS — BP 120/78 | HR 103 | Resp 16 | Ht 67.0 in | Wt 144.0 lb

## 2017-11-19 DIAGNOSIS — R972 Elevated prostate specific antigen [PSA]: Secondary | ICD-10-CM

## 2017-11-19 DIAGNOSIS — Z1211 Encounter for screening for malignant neoplasm of colon: Secondary | ICD-10-CM

## 2017-11-19 DIAGNOSIS — Z0000 Encounter for general adult medical examination without abnormal findings: Secondary | ICD-10-CM | POA: Diagnosis not present

## 2017-11-19 DIAGNOSIS — E1169 Type 2 diabetes mellitus with other specified complication: Secondary | ICD-10-CM | POA: Diagnosis not present

## 2017-11-19 DIAGNOSIS — E785 Hyperlipidemia, unspecified: Secondary | ICD-10-CM | POA: Diagnosis not present

## 2017-11-19 NOTE — Progress Notes (Signed)
   [name]     [name]: [id number]      DOB: 04-11-1950   HPI: Patient is in for annual physical exam. No other health concerns are expressed or addressed at the visit. Recent labs, if available are reviewed. Immunization is reviewed , and  updated if needed.    PE; BP 120/78   Pulse (!) 103   Resp 16   Ht 5\' 7"  (1.702 m)   Wt 144 lb (65.3 kg)   SpO2 92%   BMI 22.55 kg/m   Pleasant male, alert and oriented x 3, in no cardio-pulmonary distress. Afebrile. HEENT No facial trauma or asymetry. Sinuses non tender. EOMI, pupils equally reactive to light. External ears normal, tympanic membranes clear. Oropharynx moist, no exudate. Neck: supple, no adenopathy,JVD or thyromegaly.No bruits.  Chest: Clear to ascultation bilaterally.No crackles or wheezes.Decreased air entry bilaterally Non tender to palpation  Breast: No asymetry,no masses. No nipple discharge or inversion. No axillary or supraclavicular adenopathy  Cardiovascular system; Heart sounds normal,  S1 and  S2 ,no S3.  No murmur, or thrill. Apical beat not displaced Peripheral pulses normal.  Abdomen: Soft, non tender, no organomegaly or masses. No bruits. Bowel sounds normal. No guarding, tenderness or rebound.    Musculoskeletal exam: Full ROM of spine, hips , shoulders and knees. No deformity ,swelling or crepitus noted. No muscle wasting or atrophy.   Neurologic: Cranial nerves 2 to 12 intact. Power, tone ,sensation and reflexes normal throughout. No disturbance in gait. No tremor.  Skin: Intact, no ulceration, erythema , scaling or rash noted. Pigmentation normal throughout  Psych; Normal mood and affect. Judgement and concentration normal   Assessment & Plan:  Annual physical exam Annual exam as documented. Counseling done  re healthy lifestyle involving commitment to 150 minutes exercise per week, heart healthy diet, and attaining healthy weight.The importance of adequate sleep also  discussed. Regular seat belt use and home safety, is also discussed. Changes in health habits are decided on by the patient with goals and time frames  set for achieving them. Immunization and cancer screening needs are specifically addressed at this visit.

## 2017-11-19 NOTE — Patient Instructions (Addendum)
Wellness with nurse March 17 or affter  Follow up with mD in 6 months, call if you need me before  Labs are past due, needs lipid, cmp and eGFR, hBA1C and PSA today  Cologuard test to be set up today at visit before he leaves

## 2017-11-19 NOTE — Assessment & Plan Note (Signed)

## 2017-11-20 ENCOUNTER — Telehealth: Payer: Self-pay

## 2017-11-20 DIAGNOSIS — I10 Essential (primary) hypertension: Secondary | ICD-10-CM

## 2017-11-20 DIAGNOSIS — E785 Hyperlipidemia, unspecified: Secondary | ICD-10-CM

## 2017-11-20 DIAGNOSIS — R7302 Impaired glucose tolerance (oral): Secondary | ICD-10-CM

## 2017-11-20 LAB — HEMOGLOBIN A1C
Hgb A1c MFr Bld: 6.2 % of total Hgb — ABNORMAL HIGH (ref <5.7)
MEAN PLASMA GLUCOSE: 131 (calc)
eAG (mmol/L): 7.3 (calc)

## 2017-11-20 LAB — COMPLETE METABOLIC PANEL WITH GFR
AG Ratio: 1.8 (calc) (ref 1.0–2.5)
ALBUMIN MSPROF: 4.5 g/dL (ref 3.6–5.1)
ALT: 13 U/L (ref 9–46)
AST: 17 U/L (ref 10–35)
Alkaline phosphatase (APISO): 63 U/L (ref 40–115)
BILIRUBIN TOTAL: 0.4 mg/dL (ref 0.2–1.2)
BUN: 11 mg/dL (ref 7–25)
CHLORIDE: 100 mmol/L (ref 98–110)
CO2: 32 mmol/L (ref 20–32)
Calcium: 10.1 mg/dL (ref 8.6–10.3)
Creat: 1.07 mg/dL (ref 0.70–1.25)
GFR, Est African American: 82 mL/min/{1.73_m2} (ref >=60)
GFR, Est Non African American: 70 mL/min/{1.73_m2} (ref >=60)
GLOBULIN: 2.5 g/dL (ref 1.9–3.7)
GLUCOSE: 112 mg/dL (ref 65–139)
Potassium: 3.7 mmol/L (ref 3.5–5.3)
SODIUM: 140 mmol/L (ref 135–146)
Total Protein: 7 g/dL (ref 6.1–8.1)

## 2017-11-20 LAB — LIPID PANEL
CHOL/HDL RATIO: 2.9 (calc) (ref <5.0)
CHOLESTEROL: 212 mg/dL — AB (ref <200)
HDL: 74 mg/dL (ref >40)
LDL Cholesterol (Calc): 123 mg/dL (calc) — ABNORMAL HIGH
Non-HDL Cholesterol (Calc): 138 mg/dL (calc) — ABNORMAL HIGH (ref <130)
TRIGLYCERIDES: 56 mg/dL (ref <150)

## 2017-11-20 LAB — PSA: PSA: 7.9 ng/mL — AB (ref <=4.0)

## 2017-11-20 NOTE — Telephone Encounter (Signed)
-----  Message from Fayrene Helper, MD sent at 11/20/2017  5:47 AM EST ----- You need to reduce fried and fatty foods, your cholesterol is too high, blood sugar is still above normal, so please reduce the amount of sugar and sweets that you eat. No medication changes needed now Pls order let him know he needs in MID April, CBC, TSH, fasting lipid, cmp and EGFR and HBa1C, and mail the order

## 2017-11-24 ENCOUNTER — Other Ambulatory Visit: Payer: Self-pay

## 2017-11-24 ENCOUNTER — Emergency Department (HOSPITAL_COMMUNITY): Payer: Medicare HMO

## 2017-11-24 ENCOUNTER — Observation Stay (HOSPITAL_COMMUNITY)
Admission: EM | Admit: 2017-11-24 | Discharge: 2017-11-25 | Disposition: A | Discharge status: Home / Self Care | Payer: Medicare HMO | Attending: Family Medicine | Admitting: Family Medicine

## 2017-11-24 ENCOUNTER — Encounter (HOSPITAL_COMMUNITY): Payer: Self-pay | Admitting: Emergency Medicine

## 2017-11-24 ENCOUNTER — Encounter: Payer: Self-pay | Admitting: Family Medicine

## 2017-11-24 ENCOUNTER — Ambulatory Visit (INDEPENDENT_AMBULATORY_CARE_PROVIDER_SITE_OTHER): Payer: Medicare HMO | Admitting: Family Medicine

## 2017-11-24 VITALS — BP 157/85 | HR 146 | Resp 28 | Ht 67.0 in | Wt 130.8 lb

## 2017-11-24 DIAGNOSIS — E785 Hyperlipidemia, unspecified: Secondary | ICD-10-CM | POA: Diagnosis not present

## 2017-11-24 DIAGNOSIS — J9601 Acute respiratory failure with hypoxia: Secondary | ICD-10-CM

## 2017-11-24 DIAGNOSIS — R0602 Shortness of breath: Secondary | ICD-10-CM | POA: Diagnosis not present

## 2017-11-24 DIAGNOSIS — J441 Chronic obstructive pulmonary disease with (acute) exacerbation: Secondary | ICD-10-CM | POA: Diagnosis not present

## 2017-11-24 DIAGNOSIS — Z7982 Long term (current) use of aspirin: Secondary | ICD-10-CM | POA: Diagnosis not present

## 2017-11-24 DIAGNOSIS — I10 Essential (primary) hypertension: Secondary | ICD-10-CM | POA: Diagnosis not present

## 2017-11-24 DIAGNOSIS — Z87891 Personal history of nicotine dependence: Secondary | ICD-10-CM | POA: Insufficient documentation

## 2017-11-24 DIAGNOSIS — R000 Tachycardia, unspecified: Secondary | ICD-10-CM | POA: Diagnosis not present

## 2017-11-24 DIAGNOSIS — Z79899 Other long term (current) drug therapy: Secondary | ICD-10-CM | POA: Insufficient documentation

## 2017-11-24 DIAGNOSIS — E119 Type 2 diabetes mellitus without complications: Secondary | ICD-10-CM

## 2017-11-24 LAB — HEPATIC FUNCTION PANEL
ALT: 17 U/L (ref 0–44)
AST: 21 U/L (ref 15–41)
Albumin: 4.5 g/dL (ref 3.5–5.0)
Alkaline Phosphatase: 65 U/L (ref 38–126)
BILIRUBIN DIRECT: 0.1 mg/dL (ref 0.0–0.2)
BILIRUBIN TOTAL: 0.8 mg/dL (ref 0.3–1.2)
Indirect Bilirubin: 0.7 mg/dL (ref 0.3–0.9)
Total Protein: 8 g/dL (ref 6.5–8.1)

## 2017-11-24 LAB — CBC WITH DIFFERENTIAL/PLATELET
Abs Immature Granulocytes: 0.04 10*3/uL (ref 0.00–0.07)
BASOS ABS: 0 10*3/uL (ref 0.0–0.1)
BASOS PCT: 0 %
Eosinophils Absolute: 0.1 10*3/uL (ref 0.0–0.5)
Eosinophils Relative: 1 %
HCT: 45.9 % (ref 39.0–52.0)
Hemoglobin: 14.4 g/dL (ref 13.0–17.0)
Immature Granulocytes: 0 %
Lymphocytes Relative: 23 %
Lymphs Abs: 2.2 10*3/uL (ref 0.7–4.0)
MCH: 29.4 pg (ref 26.0–34.0)
MCHC: 31.4 g/dL (ref 30.0–36.0)
MCV: 93.7 fL (ref 80.0–100.0)
MONO ABS: 0.7 10*3/uL (ref 0.1–1.0)
Monocytes Relative: 7 %
NEUTROS PCT: 69 %
NRBC: 0 % (ref 0.0–0.2)
Neutro Abs: 6.4 10*3/uL (ref 1.7–7.7)
PLATELETS: 243 10*3/uL (ref 150–400)
RBC: 4.9 MIL/uL (ref 4.22–5.81)
RDW: 13.1 % (ref 11.5–15.5)
WBC: 9.4 10*3/uL (ref 4.0–10.5)

## 2017-11-24 LAB — GLUCOSE, CAPILLARY
Glucose-Capillary: 149 mg/dL — ABNORMAL HIGH (ref 70–99)
Glucose-Capillary: 202 mg/dL — ABNORMAL HIGH (ref 70–99)

## 2017-11-24 LAB — EXPECTORATED SPUTUM ASSESSMENT W REFEX TO RESP CULTURE

## 2017-11-24 LAB — BASIC METABOLIC PANEL
Anion gap: 11 (ref 5–15)
BUN: 12 mg/dL (ref 8–23)
CO2: 28 mmol/L (ref 22–32)
CREATININE: 0.97 mg/dL (ref 0.61–1.24)
Calcium: 9.5 mg/dL (ref 8.9–10.3)
Chloride: 99 mmol/L (ref 98–111)
GFR calc Af Amer: >60 mL/min (ref >60)
Glucose, Bld: 135 mg/dL — ABNORMAL HIGH (ref 70–99)
POTASSIUM: 3.3 mmol/L — AB (ref 3.5–5.1)
SODIUM: 138 mmol/L (ref 135–145)

## 2017-11-24 LAB — TROPONIN I

## 2017-11-24 LAB — EXPECTORATED SPUTUM ASSESSMENT W GRAM STAIN, RFLX TO RESP C

## 2017-11-24 LAB — BRAIN NATRIURETIC PEPTIDE: B Natriuretic Peptide: 26 pg/mL (ref 0.0–100.0)

## 2017-11-24 MED ORDER — PRAVASTATIN SODIUM 40 MG PO TABS: 40.0000 mg | ORAL_TABLET | Freq: Every day | ORAL | Status: DC

## 2017-11-24 MED ORDER — METHYLPREDNISOLONE ACETATE 80 MG/ML IJ SUSP
80.0000 mg | Freq: Once | INTRAMUSCULAR | Status: DC
  Administered 2017-11-24: 80 mg via INTRAMUSCULAR

## 2017-11-24 MED ORDER — METHYLPREDNISOLONE SODIUM SUCC 40 MG IJ SOLR
40.0000 mg | Freq: Two times a day (BID) | INTRAMUSCULAR | Status: DC
  Administered 2017-11-24 – 2017-11-25 (×2): 40 mg via INTRAVENOUS
  Filled 2017-11-24 (×2): qty 1

## 2017-11-24 MED ORDER — LEVOFLOXACIN IN D5W 500 MG/100ML IV SOLN
500.0000 mg | Freq: Once | INTRAVENOUS | Status: DC
  Filled 2017-11-24: qty 100

## 2017-11-24 MED ORDER — ONDANSETRON HCL 4 MG PO TABS: 4.0000 mg | ORAL_TABLET | Freq: Four times a day (QID) | ORAL | Status: DC | PRN

## 2017-11-24 MED ORDER — TRIAMTERENE-HCTZ 37.5-25 MG PO TABS
1.0000 | ORAL_TABLET | Freq: Every day | ORAL | Status: DC
  Administered 2017-11-25: 1 via ORAL
  Filled 2017-11-24: qty 1

## 2017-11-24 MED ORDER — INSULIN ASPART 100 UNIT/ML ~~LOC~~ SOLN
0.0000 [IU] | Freq: Three times a day (TID) | SUBCUTANEOUS | Status: DC
  Administered 2017-11-25 (×2): 2 [IU] via SUBCUTANEOUS

## 2017-11-24 MED ORDER — SODIUM CHLORIDE 0.9 % IV SOLN
INTRAVENOUS | Status: AC
  Filled 2017-11-24: qty 500

## 2017-11-24 MED ORDER — INSULIN ASPART 100 UNIT/ML ~~LOC~~ SOLN
0.0000 [IU] | Freq: Every day | SUBCUTANEOUS | Status: DC
  Administered 2017-11-24: 2 [IU] via SUBCUTANEOUS

## 2017-11-24 MED ORDER — IPRATROPIUM-ALBUTEROL 0.5-2.5 (3) MG/3ML IN SOLN
3.0000 mL | Freq: Four times a day (QID) | RESPIRATORY_TRACT | Status: DC
  Administered 2017-11-24 – 2017-11-25 (×3): 3 mL via RESPIRATORY_TRACT
  Filled 2017-11-24 (×3): qty 3

## 2017-11-24 MED ORDER — ALBUTEROL SULFATE (2.5 MG/3ML) 0.083% IN NEBU
5.0000 mg | INHALATION_SOLUTION | Freq: Once | RESPIRATORY_TRACT | Status: AC
  Administered 2017-11-24: 2.5 mg via RESPIRATORY_TRACT
  Filled 2017-11-24: qty 6

## 2017-11-24 MED ORDER — ALBUTEROL SULFATE (2.5 MG/3ML) 0.083% IN NEBU
2.5000 mg | INHALATION_SOLUTION | RESPIRATORY_TRACT | Status: DC | PRN
  Filled 2017-11-24: qty 3

## 2017-11-24 MED ORDER — ACETAMINOPHEN 650 MG RE SUPP: 650.0000 mg | Freq: Four times a day (QID) | RECTAL | Status: DC | PRN

## 2017-11-24 MED ORDER — SODIUM CHLORIDE 0.9 % IV SOLN
500.0000 mg | Freq: Once | INTRAVENOUS | Status: DC
  Filled 2017-11-24: qty 500

## 2017-11-24 MED ORDER — ACETAMINOPHEN 325 MG PO TABS: 650.0000 mg | ORAL_TABLET | Freq: Four times a day (QID) | ORAL | Status: DC | PRN

## 2017-11-24 MED ORDER — SODIUM CHLORIDE 0.9 % IV SOLN
1.0000 g | Freq: Once | INTRAVENOUS | Status: AC
  Administered 2017-11-24: 1 g via INTRAVENOUS
  Filled 2017-11-24 (×2): qty 10

## 2017-11-24 MED ORDER — ASPIRIN 81 MG PO CHEW
81.0000 mg | CHEWABLE_TABLET | Freq: Every day | ORAL | Status: DC
  Administered 2017-11-25: 81 mg via ORAL
  Filled 2017-11-24: qty 1

## 2017-11-24 MED ORDER — ENOXAPARIN SODIUM 40 MG/0.4ML ~~LOC~~ SOLN
40.0000 mg | SUBCUTANEOUS | Status: DC
  Administered 2017-11-24: 40 mg via SUBCUTANEOUS
  Filled 2017-11-24: qty 0.4

## 2017-11-24 MED ORDER — METHYLPREDNISOLONE SODIUM SUCC 125 MG IJ SOLR
125.0000 mg | Freq: Once | INTRAMUSCULAR | Status: AC
  Administered 2017-11-24: 125 mg via INTRAVENOUS
  Filled 2017-11-24: qty 2

## 2017-11-24 MED ORDER — IPRATROPIUM-ALBUTEROL 0.5-2.5 (3) MG/3ML IN SOLN
3.0000 mL | Freq: Once | RESPIRATORY_TRACT | Status: AC
  Administered 2017-11-24: 3 mL via RESPIRATORY_TRACT
  Filled 2017-11-24: qty 3

## 2017-11-24 MED ORDER — ONDANSETRON HCL 4 MG/2ML IJ SOLN: 4.0000 mg | Freq: Four times a day (QID) | INTRAMUSCULAR | Status: DC | PRN

## 2017-11-24 MED ORDER — MAGNESIUM SULFATE 2 GM/50ML IV SOLN
2.0000 g | Freq: Once | INTRAVENOUS | Status: AC
  Administered 2017-11-24: 2 g via INTRAVENOUS
  Filled 2017-11-24: qty 50

## 2017-11-24 NOTE — Assessment & Plan Note (Signed)
Elevated BP at visit and pt in severe respiratory distress

## 2017-11-24 NOTE — ED Provider Notes (Signed)
[facility] EMERGENCY DEPARTMENT Provider Note   CSN: [id number] Arrival date & time: 11/24/17  1027     History   Chief Complaint Chief Complaint  Patient presents with  . Shortness of Breath    HPI [name] is a 67 y.o. male.  Patient complains of shortness of breath and coughing up yellow-green stuff for a few days.  Patient has a history of COPD  The history is provided by the patient. No language interpreter was used.  Shortness of Breath  This is a new problem. The problem occurs frequently.The current episode started 12 to 24 hours ago. The problem has not changed since onset.Pertinent negatives include no fever, no headaches, no cough, no chest pain, no abdominal pain and no rash. It is unknown what precipitated the problem. Risk factors: COPD. He has tried ipratropium inhalers for the symptoms. The treatment provided no relief. He has had prior hospitalizations. He has had prior ED visits. He has had prior ICU admissions. Associated medical issues include pneumonia.    Past Medical History:  Diagnosis Date  . Asthma   . COPD (chronic obstructive pulmonary disease) ([place])   . CVA (cerebral vascular accident) ([place]) 2008   with temporary vision loss   . Depression   . Elevated PSA 2014   no diagnosis pf prostate cancer in 07/2014  . History of substance abuse ([place]) 01/14/2011   Marijuana  . History of tobacco abuse 01/14/2011  . Hyperlipidemia   . Hypertension   . Marijuana abuse   . Nicotine addiction   . Prediabetes 2014    Patient Active Problem List   Diagnosis Date Noted  . Epidermal cyst of face 01/21/2017  . Annual physical exam 11/10/2013  . Social isolation 03/17/2013  . Asthma with COPD (chronic obstructive pulmonary disease) ([place]) 11/12/2012  . History of substance abuse ([place]) 01/14/2011  . Anemia 01/14/2011  . ECHOCARDIOGRAM, ABNORMAL 03/06/2010  . Vitamin D deficiency 06/28/2009  . IGT (impaired glucose tolerance) 01/25/2009  . PSA,  INCREASED 01/25/2009  . Hyperlipidemia 07/30/2007  . Essential hypertension 07/30/2007    History reviewed. No pertinent surgical history.      Home Medications    Prior to Admission medications   Medication Sig Start Date End Date Taking? Authorizing Provider  Ipratropium-Albuterol (COMBIVENT RESPIMAT) 20-100 MCG/ACT AERS respimat Inhale 1 puff into the lungs every 6 (six) hours.   Yes [provider]  albuterol (PROVENTIL) (2.5 MG/3ML) 0.083% nebulizer solution TAKE 1 AMPULE (3 MLS) BY NEBULIZATION AS DIRECTED EVERY 4 HOURS AS NEEDED. 04/22/16   Fayrene Helper, MD  albuterol (VENTOLIN HFA) 108 (90 Base) MCG/ACT inhaler INHALE 2 PUFFS INTO THE LUNGS EVERY 4 HOURS AS NEEDED. 10/20/17   Fayrene Helper, MD  aspirin 81 MG tablet Take 81 mg by mouth daily.    [provider]  Fluticasone-Salmeterol (ADVAIR DISKUS) 500-50 MCG/DOSE AEPB INHALE 1 PUFF BY MOUTH TWICE A DAY. RINSE MOUTH AFTER USE. 09/18/17   Fayrene Helper, MD  ipratropium (ATROVENT) 0.02 % nebulizer solution INHALE 1 VIAL VIA NEBULIZER EVERY 6-8 HOURS FOR WHEEZING. 04/22/16   Fayrene Helper, MD  Multiple Vitamin (MULTIVITAMIN) tablet Take 1 tablet by mouth daily.    [provider]  pravastatin (PRAVACHOL) 40 MG tablet Take 1 tablet (40 mg total) by mouth daily. 08/12/17   Fayrene Helper, MD  triamterene-hydrochlorothiazide [facility]) 37.5-25 MG tablet One and a half tablets once daily every morning for blood pressure 08/12/17   [name]  E, MD  simvastatin (ZOCOR) 40 MG tablet Take 40 mg by mouth daily. 10/02/10 03/18/11  Fayrene Helper, MD    Family History History reviewed. No pertinent family history.  Social History Social History   Tobacco Use  . Smoking status: Former Smoker    Packs/day: 0.50    Years: 40.00    Pack years: 20.00    Types: Cigarettes    Last attempt to quit: 04/14/2015    Years since quitting: 2.6  . Smokeless tobacco: Never Used    Substance Use Topics  . Alcohol use: Yes    Alcohol/week: 3.0 standard drinks    Types: 3 Cans of beer per week    Comment: occasionally  . Drug use: No    Comment: pt quit 20 years ago     Allergies   Patient has no known allergies.   Review of Systems Review of Systems  Constitutional: Negative for appetite change, fatigue and fever.  HENT: Negative for congestion, ear discharge and sinus pressure.   Eyes: Negative for discharge.  Respiratory: Positive for shortness of breath. Negative for cough.   Cardiovascular: Negative for chest pain.  Gastrointestinal: Negative for abdominal pain and diarrhea.  Genitourinary: Negative for frequency and hematuria.  Musculoskeletal: Negative for back pain.  Skin: Negative for rash.  Neurological: Negative for seizures and headaches.  Psychiatric/Behavioral: Negative for hallucinations.     Physical Exam Updated Vital Signs BP 139/83   Pulse (!) 123   Temp 99 F (37.2 C) (Oral)   Resp (!) 24   Ht 5\' 7"  (1.702 m)   Wt 59.9 kg   SpO2 95%   BMI 20.67 kg/m   Physical Exam  Constitutional: He is oriented to person, place, and time. He appears well-developed.  HENT:  Head: Normocephalic.  Eyes: Conjunctivae and EOM are normal. No scleral icterus.  Neck: Neck supple. No thyromegaly present.  Cardiovascular: Normal rate and regular rhythm. Exam reveals no gallop and no friction rub.  No murmur heard. Pulmonary/Chest: No stridor. He has wheezes. He has no rales. He exhibits no tenderness.  Abdominal: He exhibits no distension. There is no tenderness. There is no rebound.  Musculoskeletal: Normal range of motion. He exhibits no edema.  Lymphadenopathy:    He has no cervical adenopathy.  Neurological: He is oriented to person, place, and time. He exhibits normal muscle tone. Coordination normal.  Skin: No rash noted. No erythema.  Psychiatric: He has a normal mood and affect. His behavior is normal.     ED Treatments / Results   Labs (all labs ordered are listed, but only abnormal results are displayed) Labs Reviewed  BASIC METABOLIC PANEL - Abnormal; Notable for the following components:      Result Value   Potassium 3.3 (*)    Glucose, Bld 135 (*)    All other components within normal limits  CULTURE, BLOOD (ROUTINE X 2)  CULTURE, BLOOD (ROUTINE X 2)  CBC WITH DIFFERENTIAL/PLATELET  TROPONIN I  BRAIN NATRIURETIC PEPTIDE  HEPATIC FUNCTION PANEL    EKG EKG Interpretation  Date/Time:  Monday November 24 2017 10:38:24 EST Ventricular Rate:  128 PR Interval:    QRS Duration: 95 QT Interval:  303 QTC Calculation: 443 R Axis:   85 Text Interpretation:  Sinus tachycardia Borderline right axis deviation Probable anteroseptal infarct, old Confirmed by Milton Ferguson [phone]) on 11/24/2017 12:52:51 PM   Radiology Dg Chest 2 View  Result Date: 11/24/2017 CLINICAL DATA:  Shortness of breath with chest pressure  and weakness. EXAM: CHEST - 2 VIEW COMPARISON:  Two-view chest x-ray 01/19/2017 FINDINGS: Heart size is normal. There is no edema or effusion. The lungs are hyperinflated, as before. Ill-defined airspace opacities are present in the right middle lobe. The visualized soft tissues and bony thorax are unremarkable. IMPRESSION: Ill-defined right middle lobe airspace disease superimposed on hyperinflation is new from the prior exam. This is concerning for early or atypical infection. Electronically Signed   By: San Morelle M.D.   On: 11/24/2017 11:26    Procedures Procedures (including critical care time)  Medications Ordered in ED Medications  levofloxacin (LEVAQUIN) IVPB 500 mg ( Intravenous Restarted 11/24/17 1332)  albuterol (PROVENTIL) (2.5 MG/3ML) 0.083% nebulizer solution 5 mg (2.5 mg Nebulization Given 11/24/17 1115)  ipratropium-albuterol (DUONEB) 0.5-2.5 (3) MG/3ML nebulizer solution 3 mL (3 mLs Nebulization Given 11/24/17 1115)  methylPREDNISolone sodium succinate (SOLU-MEDROL) 125 mg/2  mL injection 125 mg (125 mg Intravenous Given 11/24/17 1057)  magnesium sulfate IVPB 2 g 50 mL (0 g Intravenous Stopped 11/24/17 1200)     Initial Impression / Assessment and Plan / ED Course  I have reviewed the triage vital signs and the nursing notes.  Pertinent labs & imaging results that were available during my care of the patient were reviewed by me and considered in my medical decision making (see chart for details).     Patient with pneumonia and moderate exacerbation of COPD.  He will be admitted to medicine Final Clinical Impressions(s) / ED Diagnoses   Final diagnoses:  COPD exacerbation [facility])    ED Discharge Orders    None       Milton Ferguson, MD 11/24/17 1400

## 2017-11-24 NOTE — H&P (Signed)
History and Physical   [name] SEG:[id number] DOB: [birth year] DOA: 11/24/2017  Referring MD/NP/PA: EDP, Dr. Roderic Palau PCP: Fayrene Helper, MD Outpatient Specialists: Pulmonology, Dr. Luan Pulling  Patient coming from: Home  Chief Complaint: Shortness of breath  HPI: [name] is a 67 y.o. male with a history of prediabetes, HTN, HLD, and asthma with COPD who presented to the ED this morning with shortness of breath. He awoke with difficulty breathing associated with a change in typical sputum to yellow-green. Symptoms continued constant, progressively worsened, worse when moving around but not changed with position, and not appreciably improved with home breathing treatment. No chest pain, palpitations, orthopnea, PND, leg swelling or pain. He went to his PCP's office where he was given depomedrol injection and referred to the ED.   ED Course: He appeared in mild respiratory distress, tachycardic, tachypneic with wheezing and hypoxic requiring 2L O2. K 3.3 in setting of albuterol, otherwise BMP, LFTs, CBC w/differential are essentially normal. BNP 26, troponin negative, and ECG confirming sinus tachycardia without ischemic changes. Continuous nebulizers were given, IV steroid given, and levaquin started. Medicine called to admit. The patient reports feeling somewhat better after breathing treatments but still feel significantly short of breath.   Review of Systems: No fevers, sick contacts, long travel, and per HPI. All others reviewed and are negative.   Past Medical History:  Diagnosis Date  . Asthma   . COPD (chronic obstructive pulmonary disease) ([place])   . CVA (cerebral vascular accident) ([place]) 2008   with temporary vision loss   . Depression   . Elevated PSA 2014   no diagnosis pf prostate cancer in 07/2014  . History of substance abuse ([place]) 01/14/2011   Marijuana  . History of tobacco abuse 01/14/2011  . Hyperlipidemia   . Hypertension   . Marijuana abuse   . Nicotine  addiction   . Prediabetes 2014   History reviewed. No pertinent surgical history. - Reports to me 5-6 years of ~1 ppd smoking, though this seems less than is documented in the medical record. Not currently smoking. Drinks cognac ~1-2 1oz shots intermittently, not everyday, denies binge drinking. Denies illicit drugs. 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   No Known Allergies  - Family history reviewed with patient who states all his family is dead, denies any FH COPD or blood clots. Unsure of heart disease.  Prior to Admission medications   Medication Sig Start Date End Date Taking? Authorizing Provider  albuterol (PROVENTIL) (2.5 MG/3ML) 0.083% nebulizer solution TAKE 1 AMPULE (3 MLS) BY NEBULIZATION AS DIRECTED EVERY 4 HOURS AS NEEDED. 04/22/16  Yes Fayrene Helper, MD  albuterol (VENTOLIN HFA) 108 (90 Base) MCG/ACT inhaler INHALE 2 PUFFS INTO THE LUNGS EVERY 4 HOURS AS NEEDED. Patient taking differently: Inhale 1-2 puffs into the lungs every 6 (six) hours as needed. INHALE 2 PUFFS INTO THE LUNGS EVERY 4 HOURS AS NEEDED. 10/20/17  Yes Fayrene Helper, MD  aspirin 81 MG tablet Take 81 mg by mouth daily.   Yes [provider]  Fluticasone-Salmeterol (ADVAIR DISKUS) 500-50 MCG/DOSE AEPB INHALE 1 PUFF BY MOUTH TWICE A DAY. RINSE MOUTH AFTER USE. Patient taking differently: Inhale 1 puff into the lungs 2 (two) times daily. INHALE 1 PUFF BY MOUTH TWICE A DAY. RINSE MOUTH AFTER USE. 09/18/17  Yes Fayrene Helper, MD  ipratropium (ATROVENT) 0.02 % nebulizer solution INHALE 1 VIAL VIA NEBULIZER EVERY 6-8 HOURS FOR WHEEZING. 04/22/16  Yes Fayrene Helper, MD  Ipratropium-Albuterol (  COMBIVENT RESPIMAT) 20-100 MCG/ACT AERS respimat Inhale 1 puff into the lungs every 6 (six) hours.   Yes [provider]  pravastatin (PRAVACHOL) 40 MG tablet Take 1 tablet (40 mg total) by mouth daily. 08/12/17  Yes Fayrene Helper, MD  triamterene-hydrochlorothiazide (MAXZIDE-25) 37.5-25 MG  tablet One and a half tablets once daily every morning for blood pressure Patient taking differently: Take 1 tablet by mouth daily. One and a half tablets once daily every morning for blood pressure 08/12/17  Yes Fayrene Helper, MD  simvastatin (ZOCOR) 40 MG tablet Take 40 mg by mouth daily. 10/02/10 03/18/11  Fayrene Helper, MD    Physical Exam: Vitals:   11/24/17 1200 11/24/17 1230 11/24/17 1300 11/24/17 1400  BP: (!) 146/94 139/83 132/88 111/71  Pulse: (!) 123   (!) 112  Resp:      Temp:      TempSrc:      SpO2: 95%   93%  Weight:      Height:       Constitutional: 67 y.o. male in no distress, calm demeanor Eyes: Lids and conjunctivae normal, arcus senilis, PERRL ENMT: Mucous membranes are moist. Posterior pharynx clear of any exudate or lesions. Fair dentition.  Neck: normal, supple, no masses, no thyromegaly Respiratory: Mild tachypnea with accessory muscle use. Diminished bilaterally without wheezes.  Cardiovascular: Regular rate and rhythm, no murmurs, rubs, or gallops. No carotid bruits. No JVD. No LE edema. Palpable pedal pulses. Abdomen: Normoactive bowel sounds. No tenderness, non-distended, and no masses palpated. No hepatosplenomegaly. GU: No indwelling catheter Musculoskeletal: No clubbing / cyanosis. No joint deformity upper and lower extremities. Good ROM, no contractures. Normal muscle tone.  Skin: Warm, dry. No rashes, wounds, or ulcers. No significant lesions noted.  Neurologic: CN II-XII grossly intact. Speech normal. No focal deficits in motor strength or sensation in all extremities.  Psychiatric: Alert and oriented x3. Normal judgment and insight. Mood euthymic with congruent affect.   Labs on Admission: I have personally reviewed following labs and imaging studies  CBC: Recent Labs  Lab 11/24/17 1049  WBC 9.4  NEUTROABS 6.4  HGB 14.4  HCT 45.9  MCV 93.7  PLT 169   Basic Metabolic Panel: Recent Labs  Lab 11/19/17 1052 11/24/17 1049  NA 140  138  K 3.7 3.3*  CL 100 99  CO2 32 28  GLUCOSE 112 135*  BUN 11 12  CREATININE 1.07 0.97  CALCIUM 10.1 9.5   GFR: Estimated Creatinine Clearance: 60.9 mL/min (by C-G formula based on SCr of 0.97 mg/dL). Liver Function Tests: Recent Labs  Lab 11/19/17 1052 11/24/17 1052  AST 17 21  ALT 13 17  ALKPHOS  --  65  BILITOT 0.4 0.8  PROT 7.0 8.0  ALBUMIN  --  4.5   No results for input(s): LIPASE, AMYLASE in the last 168 hours. No results for input(s): AMMONIA in the last 168 hours. Coagulation Profile: No results for input(s): INR, PROTIME in the last 168 hours. Cardiac Enzymes: Recent Labs  Lab 11/24/17 1049  TROPONINI <0.03   BNP (last 3 results) No results for input(s): PROBNP in the last 8760 hours. HbA1C: No results for input(s): HGBA1C in the last 72 hours. CBG: No results for input(s): GLUCAP in the last 168 hours. Lipid Profile: No results for input(s): CHOL, HDL, LDLCALC, TRIG, CHOLHDL, LDLDIRECT in the last 72 hours. Thyroid Function Tests: No results for input(s): TSH, T4TOTAL, FREET4, T3FREE, THYROIDAB in the last 72 hours. Anemia Panel: No results  for input(s): VITAMINB12, FOLATE, FERRITIN, TIBC, IRON, RETICCTPCT in the last 72 hours. Urine analysis:    Component Value Date/Time   COLORURINE YELLOW 02/17/2011 2236   APPEARANCEUR CLEAR 02/17/2011 2236   LABSPEC >1.030 (H) 02/17/2011 2236   PHURINE 5.5 02/17/2011 2236   GLUCOSEU NEGATIVE 02/17/2011 2236   GLUCOSEU 100 (A) 07/30/2006 0507   HGBUR TRACE (A) 02/17/2011 2236   BILIRUBINUR NEGATIVE 02/17/2011 2236   KETONESUR NEGATIVE 02/17/2011 2236   PROTEINUR NEGATIVE 02/17/2011 2236   UROBILINOGEN 0.2 02/17/2011 2236   NITRITE NEGATIVE 02/17/2011 2236   LEUKOCYTESUR NEGATIVE 02/17/2011 2236    Recent Results (from the past 240 hour(s))  Blood culture (routine x 2)     Status: None (Preliminary result)   Collection Time: 11/24/17  1:30 PM  Result Value Ref Range Status   Specimen Description RIGHT  ANTECUBITAL  Final   Special Requests   Final    BOTTLES DRAWN AEROBIC AND ANAEROBIC Blood Culture adequate volume Performed at [facility], [address] [place], [place] [address]    Culture PENDING  Incomplete   Report Status PENDING  Incomplete  Blood culture (routine x 2)     Status: None (Preliminary result)   Collection Time: 11/24/17  1:30 PM  Result Value Ref Range Status   Specimen Description BLOOD RIGHT FOREARM  Final   Special Requests   Final    BOTTLES DRAWN AEROBIC AND ANAEROBIC Blood Culture adequate volume Performed at [facility], [address], [place], [place] [address]    Culture PENDING  Incomplete   Report Status PENDING  Incomplete     Radiological Exams on Admission: Dg Chest 2 View  Result Date: 11/24/2017 CLINICAL DATA:  Shortness of breath with chest pressure and weakness. EXAM: CHEST - 2 VIEW COMPARISON:  Two-view chest x-ray 01/19/2017 FINDINGS: Heart size is normal. There is no edema or effusion. The lungs are hyperinflated, as before. Ill-defined airspace opacities are present in the right middle lobe. The visualized soft tissues and bony thorax are unremarkable. IMPRESSION: Ill-defined right middle lobe airspace disease superimposed on hyperinflation is new from the prior exam. This is concerning for early or atypical infection. Electronically Signed   By: San Morelle M.D.   On: 11/24/2017 11:26    EKG: Independently reviewed. Sinus tachycardia with ventricular rate 128bpm, no ischemic changes.   Assessment/Plan Principal Problem:   COPD exacerbation (HCC) Active Problems:   Hyperlipidemia   Essential hypertension   Diabetes mellitus type 2, controlled ([place])   Acute respiratory failure with hypoxemia (HCC)   Acute hypoxic respiratory failure due to COPD exacerbation, possibly due to RML PNA: - COPD order set use. Scheduled anrd prn bronchodilators, continue IV steroids. - Monitor blood cultures, ordered sputum culture.  - Agree  with abx, will switch away from FQ to both typical and atypical coverage, CTX, azithromycin.   Diet-controlled T2DM:  - Check CBGs and provide SSI if needed in setting of high dose steroids  HTN: Mildly elevated here - Continue home medication.   Hypokalemia: In the setting of beta agonist therapy, this may be spurious. Will not treat at this time.   Hyperlipidemia:  - Continue statin  History of CVA:  - Continue ASA, statin  History of polysubstance abuse and alcohol use: Denies EtOH abuse or h/o withdrawal.  - Monitor for evidence of withdrawal.  DVT prophylaxis: Lovenox  Code Status: Full  Family Communication: None at bedside, "no body to call."  Disposition Plan: [place] home if continues  improvement in next 24 hours. Consults called: Will discuss with Dr. Luan Pulling, with whom he has an appointment tomorrow. Admission status: Observation    Patrecia Pour, MD [facility] [email or web address] Password [email or web address] 11/24/2017, 3:05 PM

## 2017-11-24 NOTE — ED Notes (Signed)
ED TO INPATIENT HANDOFF REPORT  Name/Age/Gender [name] 67 y.o. male  Code Status Code Status History    Date Active Date Inactive Code Status Order ID Comments User Context   05/30/2015 1638 06/01/2015 1605 Full Code [id number]  Erline Hau, MD Inpatient   03/06/2011 1758 03/08/2011 1539 Full Code [id number]  Doree Albee, MD ED   03/04/2011 2350 03/06/2011 1415 Full Code [id number]  Derrill Kay A Inpatient   02/18/2011 0209 02/23/2011 1348 Full Code [id number]  Dietrich Pates, RN Inpatient   11/05/2010 2331 11/08/2010 1628 Full Code [id number]  Arnette Felts, RN Inpatient      Home/SNF/Other Home  Chief Complaint shortness of breath  Level of Care/Admitting Diagnosis ED Disposition    ED Disposition Condition [facility] Area: [facility] [275170]  Level of Care: Med-Surg [16]  Diagnosis: COPD exacerbation [facility]) [017494]  Admitting Physician: Patrecia Pour [phone]  Attending Physician: Patrecia Pour [phone]  PT Class (Do Not Modify): Observation [104]  PT Acc Code (Do Not Modify): Observation [10022]       Medical History Past Medical History:  Diagnosis Date  . Asthma   . COPD (chronic obstructive pulmonary disease) ([place])   . CVA (cerebral vascular accident) ([place]) 2008   with temporary vision loss   . Depression   . Elevated PSA 2014   no diagnosis pf prostate cancer in 07/2014  . History of substance abuse ([place]) 01/14/2011   Marijuana  . History of tobacco abuse 01/14/2011  . Hyperlipidemia   . Hypertension   . Marijuana abuse   . Nicotine addiction   . Prediabetes 2014    Allergies No Known Allergies  IV Location/Drains/Wounds Patient Lines/Drains/Airways Status   Active Line/Drains/Airways    Name:   Placement date:   Placement time:   Site:   Days:   Peripheral IV 11/24/17 Left;Medial Forearm   11/24/17    1048    Forearm   less than 1          Labs/Imaging Results for orders placed or performed during the  hospital encounter of 11/24/17 (from the past 48 hour(s))  CBC with Differential     Status: None   Collection Time: 11/24/17 10:49 AM  Result Value Ref Range   WBC 9.4 4.0 - 10.5 K/uL   RBC 4.90 4.22 - 5.81 MIL/uL   Hemoglobin 14.4 13.0 - 17.0 g/dL   HCT 45.9 39.0 - 52.0 %   MCV 93.7 80.0 - 100.0 fL   MCH 29.4 26.0 - 34.0 pg   MCHC 31.4 30.0 - 36.0 g/dL   RDW 13.1 11.5 - 15.5 %   Platelets 243 150 - 400 K/uL   nRBC 0.0 0.0 - 0.2 %   Neutrophils Relative % 69 %   Neutro Abs 6.4 1.7 - 7.7 K/uL   Lymphocytes Relative 23 %   Lymphs Abs 2.2 0.7 - 4.0 K/uL   Monocytes Relative 7 %   Monocytes Absolute 0.7 0.1 - 1.0 K/uL   Eosinophils Relative 1 %   Eosinophils Absolute 0.1 0.0 - 0.5 K/uL   Basophils Relative 0 %   Basophils Absolute 0.0 0.0 - 0.1 K/uL   Immature Granulocytes 0 %   Abs Immature Granulocytes 0.04 0.00 - 0.07 K/uL    Comment: Performed at [facility], [address] [place], [place] [address]  Basic metabolic panel     Status: Abnormal   Collection Time: 11/24/17 10:49 AM  Result Value Ref Range   Sodium 138 135 - 145 mmol/L   Potassium 3.3 (L) 3.5 - 5.1 mmol/L   Chloride 99 98 - 111 mmol/L   CO2 28 22 - 32 mmol/L   Glucose, Bld 135 (H) 70 - 99 mg/dL   BUN 12 8 - 23 mg/dL   Creatinine, Ser 0.97 0.61 - 1.24 mg/dL   Calcium 9.5 8.9 - 10.3 mg/dL   GFR calc non Af Amer >60 >60 mL/min   GFR calc Af Amer >60 >60 mL/min    Comment: (NOTE) The eGFR has been calculated using the CKD EPI equation. This calculation has not been validated in all clinical situations. eGFR's persistently <60 mL/min signify possible Chronic Kidney Disease.    Anion gap 11 5 - 15    Comment: Performed at [facility], [address] [place], [place] [address]  Troponin I - ONCE - STAT     Status: None   Collection Time: 11/24/17 10:49 AM  Result Value Ref Range   Troponin I <0.03 <0.03 ng/mL    Comment: Performed at [facility], [address] [place], [place] [address]  Brain  natriuretic peptide     Status: None   Collection Time: 11/24/17 10:49 AM  Result Value Ref Range   B Natriuretic Peptide 26.0 0.0 - 100.0 pg/mL    Comment: Performed at [facility], [address] [place], [place] [address]  Hepatic function panel     Status: None   Collection Time: 11/24/17 10:52 AM  Result Value Ref Range   Total Protein 8.0 6.5 - 8.1 g/dL   Albumin 4.5 3.5 - 5.0 g/dL   AST 21 15 - 41 U/L   ALT 17 0 - 44 U/L   Alkaline Phosphatase 65 38 - 126 U/L   Total Bilirubin 0.8 0.3 - 1.2 mg/dL   Bilirubin, Direct 0.1 0.0 - 0.2 mg/dL   Indirect Bilirubin 0.7 0.3 - 0.9 mg/dL    Comment: Performed at [facility], [address], [place], [place] [address]  Blood culture (routine x 2)     Status: None (Preliminary result)   Collection Time: 11/24/17  1:30 PM  Result Value Ref Range   Specimen Description RIGHT ANTECUBITAL    Special Requests      BOTTLES DRAWN AEROBIC AND ANAEROBIC Blood Culture adequate volume Performed at [facility], [address] [place], [place] [address]    Culture PENDING    Report Status PENDING   Blood culture (routine x 2)     Status: None (Preliminary result)   Collection Time: 11/24/17  1:30 PM  Result Value Ref Range   Specimen Description BLOOD RIGHT FOREARM    Special Requests      BOTTLES DRAWN AEROBIC AND ANAEROBIC Blood Culture adequate volume Performed at [facility], [address] [place], [place] [address]    Culture PENDING    Report Status PENDING    Dg Chest 2 View  Result Date: 11/24/2017 CLINICAL DATA:  Shortness of breath with chest pressure and weakness. EXAM: CHEST - 2 VIEW COMPARISON:  Two-view chest x-ray 01/19/2017 FINDINGS: Heart size is normal. There is no edema or effusion. The lungs are hyperinflated, as before. Ill-defined airspace opacities are present in the right middle lobe. The visualized soft tissues and bony thorax are unremarkable. IMPRESSION: Ill-defined right middle lobe airspace disease superimposed  on hyperinflation is new from the prior exam. This is concerning for early or atypical infection. Electronically Signed   By: Harrell Gave  Mattern M.D.   On: 11/24/2017 11:26    Pending Labs Unresulted Labs (From admission, onward)    Start     Ordered   11/24/17 1359  Expectorated sputum assessment w rflx to resp cult  Once,   R     11/24/17 1359   Signed and Held  Creatinine, serum  (enoxaparin (LOVENOX)    CrCl >/= 30 ml/min)  Weekly,   R    Comments:  while on enoxaparin therapy    Signed and Held          Vitals/Pain Today's Vitals   11/24/17 1200 11/24/17 1230 11/24/17 1300 11/24/17 1400  BP: (!) 146/94 139/83 132/88 111/71  Pulse: (!) 123   (!) 112  Resp:      Temp:      TempSrc:      SpO2: 95%   93%  Weight:      Height:      PainSc:        Isolation Precautions No active isolations  Medications Medications  levofloxacin (LEVAQUIN) IVPB 500 mg (0 mg Intravenous Stopped 11/24/17 1436)  albuterol (PROVENTIL) (2.5 MG/3ML) 0.083% nebulizer solution 5 mg (2.5 mg Nebulization Given 11/24/17 1115)  ipratropium-albuterol (DUONEB) 0.5-2.5 (3) MG/3ML nebulizer solution 3 mL (3 mLs Nebulization Given 11/24/17 1115)  methylPREDNISolone sodium succinate (SOLU-MEDROL) 125 mg/2 mL injection 125 mg (125 mg Intravenous Given 11/24/17 1057)  magnesium sulfate IVPB 2 g 50 mL (0 g Intravenous Stopped 11/24/17 1200)    Mobility walks

## 2017-11-24 NOTE — ED Notes (Signed)
RT notified for neb tx. 

## 2017-11-24 NOTE — ED Triage Notes (Signed)
Patient complaining of shortness of breath and chest pressure starting this morning. Patient has labored breathing with retractions at triage.

## 2017-11-24 NOTE — Patient Instructions (Signed)
You need to go to the Ed for further management of your shortness of breath  You have received Depo medrol 80 mg im in the office today  I have spoken to the Doctor in the eD

## 2017-11-24 NOTE — Assessment & Plan Note (Signed)
Acute symptom onset x 2 hrs, pt decompensated and ill.  Depo Medrol 80g Im in the office , oxygen supplemental via  [place] and taken by myself to the ED

## 2017-11-24 NOTE — ED Notes (Signed)
Placed patient on 2L O2 via nasal canula. O2 saturation increased to 95%.

## 2017-11-24 NOTE — ED Notes (Signed)
Stopped Levaquin. Awaiting Blood cultures to be drawn by lab.

## 2017-11-24 NOTE — Progress Notes (Signed)
   [name]     [name]: [id number]      DOB: 09-May-1950   HPI [name] is here with acute onset of difficulty breathing with shortness of breath and feeling weak. Denies recent fever or chills  ROS See HPI  Denies recent fever or chills. Denies sinus pressure, nasal congestion, ear pain or sore throat. Denies chest congestion, productive cough Denies abdominal pain, nausea, vomiting,diarrhea or constipation.   Denies dysuria, frequency, hesitancy or incontinence. Denies joint pain, swelling and limitation in mobility. Denies headaches, seizures, numbness, or tingling. Denies depression, anxiety or insomnia. Denies skin break down or rash.   PE  BP (!) 157/85 (BP Location: Left Arm, Patient Position: Sitting)   Pulse (!) 146   Resp (!) 28   SpO2 (!) 87% Comment: was able to get up to 96 with nasal canula and 4 lpm oxygen  Patient alertand in no cardiopulmonary distress.Tachypneic and hypoxic  HEENT: No facial asymmetry, EOMI,   oropharynx pink and moist.  Neck supple no JVD, no mass.  Chest: Markedly reduced air entry throughout CVS: S1, S2 no murmurs, no S3Tachycardia ABD: Soft non tender.   Ext: No edema  MS: Adequate ROM spine, shoulders, hips and knees.  Skin: Intact, no ulcerations or rash noted.  Psych: Good eye contact, normal affect. Memory intact not anxious or depressed appearing.  CNS: CN 2-12 intact, power,  normal throughout.no focal deficits noted.   Assessment & Plan  Acute respiratory failure with hypoxemia (HCC) Acute symptom onset x 2 hrs, pt decompensated and ill.  Depo Medrol 80g Im in the office , oxygen supplemental via  [place] and taken by myself to the ED  Essential hypertension Elevated BP at visit and pt in severe respiratory distress

## 2017-11-24 NOTE — Progress Notes (Signed)
RT assisted patient in doing IS. Patient able to pull 1250 with good effort.

## 2017-11-25 ENCOUNTER — Telehealth: Payer: Self-pay

## 2017-11-25 DIAGNOSIS — I10 Essential (primary) hypertension: Secondary | ICD-10-CM | POA: Diagnosis not present

## 2017-11-25 DIAGNOSIS — E785 Hyperlipidemia, unspecified: Secondary | ICD-10-CM | POA: Diagnosis not present

## 2017-11-25 DIAGNOSIS — E119 Type 2 diabetes mellitus without complications: Secondary | ICD-10-CM | POA: Diagnosis not present

## 2017-11-25 DIAGNOSIS — J441 Chronic obstructive pulmonary disease with (acute) exacerbation: Secondary | ICD-10-CM | POA: Diagnosis not present

## 2017-11-25 DIAGNOSIS — J9601 Acute respiratory failure with hypoxia: Secondary | ICD-10-CM | POA: Diagnosis not present

## 2017-11-25 LAB — GLUCOSE, CAPILLARY
GLUCOSE-CAPILLARY: 156 mg/dL — AB (ref 70–99)
Glucose-Capillary: 159 mg/dL — ABNORMAL HIGH (ref 70–99)

## 2017-11-25 MED ORDER — PREDNISONE 20 MG PO TABS: 40.0000 mg | ORAL_TABLET | Freq: Every day | ORAL | 0 refills | Status: AC

## 2017-11-25 MED ORDER — LEVOFLOXACIN 500 MG PO TABS: 500.0000 mg | ORAL_TABLET | Freq: Every day | ORAL | 0 refills | Status: DC

## 2017-11-25 MED ORDER — SODIUM CHLORIDE 0.9 % IV SOLN
500.0000 mg | Freq: Once | INTRAVENOUS | Status: AC
  Administered 2017-11-25: 500 mg via INTRAVENOUS
  Filled 2017-11-25: qty 500

## 2017-11-25 NOTE — Discharge Summary (Signed)
Physician Discharge Summary  [name]:[id number] DOB: [birth year] DOA: 11/24/2017  PCP: Fayrene Helper, MD  Admit date: 11/24/2017 Discharge date: 11/25/2017  Admitted From: Home Disposition: Home   Recommendations for Outpatient Follow-up:  1. Follow up with PCP in 1-2 weeks 2. Follow up with pulmonology as soon as possible as the patient missed his scheduled appointment 11/19 due to hospitalization  Home Health: None Equipment/Devices: None Discharge Condition: Stable CODE STATUS: Full Diet recommendation: Heart healthy, carb-modified  Brief/Interim Summary: [name] is a 67 y.o. male with a history of prediabetes, HTN, HLD, and asthma with COPD who presented to the ED with shortness of breath. He awoke with difficulty breathing associated with a change in typical sputum to yellow-green. Symptoms continued constant, progressively worsened, worse when moving around but not changed with position, and not appreciably improved with home breathing treatment. No chest pain, palpitations, orthopnea, PND, leg swelling or pain. He went to his PCP's office where he was given depomedrol injection and referred to the ED.   ED Course: He appeared in mild respiratory distress, tachycardic, tachypneic with wheezing and hypoxic requiring 2L O2. K 3.3 in setting of albuterol, otherwise BMP, LFTs, CBC w/differential are essentially normal. BNP 26, troponin negative, and ECG confirming sinus tachycardia without ischemic changes. Continuous nebulizers were given, IV steroid given, and levaquin started. Medicine called to admit.   Hospital Course: The patient reported improvement in symptoms and return to baseline respiratory status the following day.  Discharge Diagnoses:  Principal Problem:   COPD exacerbation ([place]) Active Problems:   Hyperlipidemia   Essential hypertension   Diabetes mellitus type 2, controlled ([place])   Acute respiratory failure with hypoxemia ([facility])  Acute  hypoxic respiratory failure due to COPD exacerbation, possibly due to RML PNA: - Continue antibiotics, needs atypical coverage as well. Will complete course with levaquin per insurance formulary. CrCl >32ml/min. - Continue home nebulizer therapy - Complete steroid burst. - Blood cultures NGTD, sputum culture without result, reincubated   Diet-controlled T2DM:  - PCP follow up  HTN:  - Continue home medication  Hypokalemia: In the setting of beta agonist therapy, this may be spurious. consider recheck at follow up  Hyperlipidemia:  - Continue statin  History of CVA:  - Continue ASA, statin  History of polysubstance abuse and alcohol use: Denies EtOH abuse or h/o withdrawal.  - Monitor for evidence of withdrawal.  Discharge Instructions Discharge Instructions    Diet - low sodium heart healthy   Complete by:  As directed    Discharge instructions   Complete by:  As directed    Continue taking levaquin and prednisone every morning until you run out of medications. Continue using breathing traetments at home. Call Dr. Luan Pulling' office to reschedule your appointment. He was notified of your admission today. If your symptoms worsen, seek medical attention right away.   Increase activity slowly   Complete by:  As directed      Allergies as of 11/25/2017   No Known Allergies     Medication List    TAKE these medications   albuterol (2.5 MG/3ML) 0.083% nebulizer solution Commonly known as:  PROVENTIL TAKE 1 AMPULE (3 MLS) BY NEBULIZATION AS DIRECTED EVERY 4 HOURS AS NEEDED. What changed:  Another medication with the same name was changed. Make sure you understand how and when to take each.   albuterol 108 (90 Base) MCG/ACT inhaler Commonly known as:  PROVENTIL HFA;VENTOLIN HFA INHALE 2 PUFFS INTO THE LUNGS EVERY 4  HOURS AS NEEDED. What changed:    how much to take  how to take this  when to take this  reasons to take this   aspirin 81 MG tablet Take 81 mg by  mouth daily.   COMBIVENT RESPIMAT 20-100 MCG/ACT Aers respimat Generic drug:  Ipratropium-Albuterol Inhale 1 puff into the lungs every 6 (six) hours.   Fluticasone-Salmeterol 500-50 MCG/DOSE Aepb Commonly known as:  ADVAIR INHALE 1 PUFF BY MOUTH TWICE A DAY. RINSE MOUTH AFTER USE. What changed:    how much to take  how to take this  when to take this   ipratropium 0.02 % nebulizer solution Commonly known as:  ATROVENT INHALE 1 VIAL VIA NEBULIZER EVERY 6-8 HOURS FOR WHEEZING.   levofloxacin 500 MG tablet Commonly known as:  LEVAQUIN Take 1 tablet (500 mg total) by mouth daily.   pravastatin 40 MG tablet Commonly known as:  PRAVACHOL Take 1 tablet (40 mg total) by mouth daily.   predniSONE 20 MG tablet Commonly known as:  DELTASONE Take 2 tablets (40 mg total) by mouth daily for 4 days.   triamterene-hydrochlorothiazide 37.5-25 MG tablet Commonly known as:  MAXZIDE-25 One and a half tablets once daily every morning for blood pressure What changed:    how much to take  how to take this  when to take this      Follow-up Information    Fayrene Helper, MD. Schedule an appointment as soon as possible for a visit in 2 week(s).   Specialty:  Family Medicine Contact information: [address] [place] [address] [phone]        Sinda Du, MD. Schedule an appointment as soon as possible for a visit.   Specialty:  Pulmonary Disease Contact information: [address] [place] [address] [phone]          No Known Allergies  Consultations:  None  Procedures/Studies: Dg Chest 2 View  Result Date: 11/24/2017 CLINICAL DATA:  Shortness of breath with chest pressure and weakness. EXAM: CHEST - 2 VIEW COMPARISON:  Two-view chest x-ray 01/19/2017 FINDINGS: Heart size is normal. There is no edema or effusion. The lungs are hyperinflated, as before. Ill-defined airspace opacities are present in the right middle lobe. The  visualized soft tissues and bony thorax are unremarkable. IMPRESSION: Ill-defined right middle lobe airspace disease superimposed on hyperinflation is new from the prior exam. This is concerning for early or atypical infection. Electronically Signed   By: San Morelle M.D.   On: 11/24/2017 11:26    Subjective: Feels he is breathing much better. No other complaints. Maintained SpO2 97% on room air with ambulation this morning.  Discharge Exam: Vitals:   11/25/17 0111 11/25/17 0807  BP:    Pulse:    Resp:    Temp:    SpO2: 98% 98%   General: Pt is alert, awake, not in acute distress Cardiovascular: RRR, S1/S2 +, no rubs, no gallops Respiratory: Clear, no wheezing, nonlabored. Abdominal: Soft, NT, ND, bowel sounds + Extremities: No edema, no cyanosis  Labs: BNP (last 3 results) Recent Labs    01/19/17 2056 11/24/17 1049  BNP 7.0 02.5   Basic Metabolic Panel: Recent Labs  Lab 11/19/17 1052 11/24/17 1049  NA 140 138  K 3.7 3.3*  CL 100 99  CO2 32 28  GLUCOSE 112 135*  BUN 11 12  CREATININE 1.07 0.97  CALCIUM 10.1 9.5   Liver Function Tests: Recent Labs  Lab 11/19/17 1052 11/24/17 1052  AST 17 21  ALT 13 17  ALKPHOS  --  65  BILITOT 0.4 0.8  PROT 7.0 8.0  ALBUMIN  --  4.5   No results for input(s): LIPASE, AMYLASE in the last 168 hours. No results for input(s): AMMONIA in the last 168 hours. CBC: Recent Labs  Lab 11/24/17 1049  WBC 9.4  NEUTROABS 6.4  HGB 14.4  HCT 45.9  MCV 93.7  PLT 243   Cardiac Enzymes: Recent Labs  Lab 11/24/17 1049  TROPONINI <0.03   BNP: Invalid input(s): POCBNP CBG: Recent Labs  Lab 11/24/17 1810 11/24/17 2154 11/25/17 0739 11/25/17 1106  GLUCAP 149* 202* 156* 159*   D-Dimer No results for input(s): DDIMER in the last 72 hours. Hgb A1c No results for input(s): HGBA1C in the last 72 hours. Lipid Profile No results for input(s): CHOL, HDL, LDLCALC, TRIG, CHOLHDL, LDLDIRECT in the last 72 hours. Thyroid  function studies No results for input(s): TSH, T4TOTAL, T3FREE, THYROIDAB in the last 72 hours.  Invalid input(s): FREET3 Anemia work up No results for input(s): VITAMINB12, FOLATE, FERRITIN, TIBC, IRON, RETICCTPCT in the last 72 hours. Urinalysis    Component Value Date/Time   COLORURINE YELLOW 02/17/2011 2236   APPEARANCEUR CLEAR 02/17/2011 2236   LABSPEC >1.030 (H) 02/17/2011 2236   PHURINE 5.5 02/17/2011 2236   GLUCOSEU NEGATIVE 02/17/2011 2236   GLUCOSEU 100 (A) 07/30/2006 0507   HGBUR TRACE (A) 02/17/2011 2236   BILIRUBINUR NEGATIVE 02/17/2011 2236   KETONESUR NEGATIVE 02/17/2011 2236   PROTEINUR NEGATIVE 02/17/2011 2236   UROBILINOGEN 0.2 02/17/2011 2236   NITRITE NEGATIVE 02/17/2011 2236   LEUKOCYTESUR NEGATIVE 02/17/2011 2236    Microbiology Recent Results (from the past 240 hour(s))  Blood culture (routine x 2)     Status: None (Preliminary result)   Collection Time: 11/24/17  1:30 PM  Result Value Ref Range Status   Specimen Description RIGHT ANTECUBITAL  Final   Special Requests   Final    BOTTLES DRAWN AEROBIC AND ANAEROBIC Blood Culture adequate volume   Culture   Final    NO GROWTH < 24 HOURS Performed at [facility], [address], [place], [place] [address]    Report Status PENDING  Incomplete  Blood culture (routine x 2)     Status: None (Preliminary result)   Collection Time: 11/24/17  1:30 PM  Result Value Ref Range Status   Specimen Description BLOOD RIGHT FOREARM  Final   Special Requests   Final    BOTTLES DRAWN AEROBIC AND ANAEROBIC Blood Culture adequate volume   Culture   Final    NO GROWTH < 24 HOURS Performed at [facility], [address], [place], [place] [address]    Report Status PENDING  Incomplete  Expectorated sputum assessment w rflx to resp cult     Status: None   Collection Time: 11/24/17  2:31 PM  Result Value Ref Range Status   Specimen Description EXPECTORATED SPUTUM  Final   Special Requests NONE  Final   Sputum  evaluation   Final    THIS SPECIMEN IS ACCEPTABLE FOR SPUTUM CULTURE Performed at [facility], [address], [place], [place] [address]    Report Status 11/24/2017 FINAL  Final  Culture, respiratory     Status: None (Preliminary result)   Collection Time: 11/24/17  2:31 PM  Result Value Ref Range Status   Specimen Description   Final    EXPECTORATED SPUTUM Performed at [facility], [address] [place], [place] [address]  Special Requests   Final    NONE Reflexed from [id number] Performed at [facility], [address] [place], [place] [address]    Gram Stain   Final    ABUNDANT WBC PRESENT, PREDOMINANTLY PMN RARE SQUAMOUS EPITHELIAL CELLS PRESENT ABUNDANT GRAM POSITIVE COCCI ABUNDANT GRAM POSITIVE RODS MODERATE GRAM NEGATIVE RODS    Culture   Final    CULTURE REINCUBATED FOR BETTER GROWTH Performed at [facility], [address], [place], [place] [address]    Report Status PENDING  Incomplete    Time coordinating discharge: Approximately 40 minutes  Patrecia Pour, MD  [facility] 11/25/2017, 11:52 AM Pager [phone]

## 2017-11-25 NOTE — Progress Notes (Signed)
Patient states understanding of discharge instructions.  

## 2017-11-25 NOTE — Progress Notes (Signed)
Patient's oxygen saturation on room air at rest is 99% and when ambulating in the hallway on room air patient maintained a saturation of 97%.

## 2017-11-25 NOTE — Telephone Encounter (Signed)
Transition Care Management Follow-up Telephone Call   Date discharged?   11-25-17             How have you been since you were released from the hospital? A little bit better    Do you understand why you were in the hospital? Coughing and went to the hospital from [place] office    Do you understand the discharge instructions?Yes, reviewed with patient    Where were you discharged to? Home    Items Reviewed:  Medications reviewed: Yes   Allergies reviewed: Yes   Dietary changes reviewed: low sodium heart healthy   Referrals reviewed: Yes    Functional Questionnaire:   Activities of Daily Living (ADLs):  Able to perform     Any transportation issues/concerns?: No    Any patient concerns?" finding a breathing machine" a nebulizer machine    Confirmed importance and date/time of follow-up visits scheduled 12-01-17     Confirmed with patient if condition begins to worsen call PCP or go to the ER.  Patient was given the office number and encouraged to call back with question or concerns.  :

## 2017-11-26 LAB — CULTURE, RESPIRATORY W GRAM STAIN: Culture: NORMAL

## 2017-11-26 LAB — CULTURE, RESPIRATORY

## 2017-11-27 DIAGNOSIS — Z1211 Encounter for screening for malignant neoplasm of colon: Secondary | ICD-10-CM | POA: Diagnosis not present

## 2017-11-30 LAB — CULTURE, BLOOD (ROUTINE X 2)
Culture: NO GROWTH
Culture: NO GROWTH
SPECIAL REQUESTS: ADEQUATE
Special Requests: ADEQUATE

## 2017-12-01 ENCOUNTER — Ambulatory Visit (INDEPENDENT_AMBULATORY_CARE_PROVIDER_SITE_OTHER): Payer: Medicare HMO | Admitting: Family Medicine

## 2017-12-01 ENCOUNTER — Encounter: Payer: Self-pay | Admitting: Family Medicine

## 2017-12-01 VITALS — BP 128/80 | HR 96 | Resp 15 | Ht 67.0 in | Wt 136.0 lb

## 2017-12-01 DIAGNOSIS — Z09 Encounter for follow-up examination after completed treatment for conditions other than malignant neoplasm: Secondary | ICD-10-CM

## 2017-12-01 DIAGNOSIS — J189 Pneumonia, unspecified organism: Secondary | ICD-10-CM

## 2017-12-01 DIAGNOSIS — R05 Cough: Secondary | ICD-10-CM

## 2017-12-01 DIAGNOSIS — R059 Cough, unspecified: Secondary | ICD-10-CM

## 2017-12-01 NOTE — Patient Instructions (Signed)
F/U as scheduled , call if you need me sooner   Please get CXR the week of December 16, early in the week, your CXR last week suggested pneumonia  We will print and give you lab ordered for May visit, please get this the END / last week of April  All the best. For 2020  Thank you  for choosing [facility]. We consider it a privelige to serve you.  Delivering excellent health care in a caring and  compassionate way is our goal.  Partnering with you,  so that together we can achieve this goal is our strategy.

## 2017-12-01 NOTE — Progress Notes (Signed)
   [name]     [name]: [id number]      DOB: 05/27/1950   HPI Mr. Kearley is here for follow up of recent hospitalization and transitional visit, HE WAS ADMITTED FROM 11/18 TO 11/19 WITH copd EXACERBATION and Atypical pneuminia Hospital course is reviewed and medication reconciliation done States he feels much improved and denies significant difficulty with breathing  ROS Denies recent fever or chills. Denies sinus pressure, nasal congestion, ear pain or sore throat. C/o mild chest  chest congestion, productive cough or wheezing. Denies chest pains, palpitations and leg swelling Denies abdominal pain, nausea, vomiting,diarrhea or constipation.   Denies dysuria, frequency, hesitancy or incontinence. Denies joint pain, swelling and limitation in mobility. Denies headaches, seizures, numbness, or tingling. Denies depression, anxiety or insomnia. Denies skin break down or rash.   PE  BP 128/80   Pulse 96   Resp 15   Ht 5\' 7"  (1.702 m)   Wt 136 lb (61.7 kg)   SpO2 96%   BMI 21.30 kg/m    Patient alert and oriented and in no cardiopulmonary distress.  HEENT: No facial asymmetry, EOMI,   oropharynx pink and moist.  Neck supple no JVD, no mass.  Chest: Clear to auscultation bilaterally.decreased air entry, no crackles or wheezes  CVS: S1, S2 no murmurs, no S3.Regular rate.  ABD: Soft non tender.   Ext: No edema  MS: Adequate ROM spine, shoulders, hips and knees.  Skin: Intact, no ulcerations or rash noted.  Psych: Good eye contact, normal affect. Memory intact not anxious or depressed appearing.  CNS: CN 2-12 intact, power,  normal throughout.no focal deficits noted.   [facility] discharge follow-up Much improved, medications currently taking are reviewed and he is taking correctly. Has f/u with Pulmonary aready scheduled  Needs rept CXR in 1 month as rthere was questionable infiltrate, he understands test already ordered  Atypical  pneumonia Markedly improved symptomatically, f/u CXR in 1 month to establish clearance

## 2017-12-02 ENCOUNTER — Encounter: Payer: Self-pay | Admitting: Family Medicine

## 2017-12-02 ENCOUNTER — Ambulatory Visit: Payer: Medicare HMO | Admitting: Family Medicine

## 2017-12-02 DIAGNOSIS — J189 Pneumonia, unspecified organism: Secondary | ICD-10-CM | POA: Insufficient documentation

## 2017-12-02 NOTE — Assessment & Plan Note (Signed)
Much improved, medications currently taking are reviewed and he is taking correctly. Has f/u with Pulmonary aready scheduled  Needs rept CXR in 1 month as rthere was questionable infiltrate, he understands test already ordered

## 2017-12-02 NOTE — Assessment & Plan Note (Signed)
Markedly improved symptomatically, f/u CXR in 1 month to establish clearance

## 2017-12-04 LAB — COLOGUARD: COLOGUARD: NEGATIVE

## 2017-12-09 DIAGNOSIS — R69 Illness, unspecified: Secondary | ICD-10-CM | POA: Diagnosis not present

## 2017-12-09 DIAGNOSIS — I10 Essential (primary) hypertension: Secondary | ICD-10-CM | POA: Diagnosis not present

## 2017-12-09 DIAGNOSIS — J9611 Chronic respiratory failure with hypoxia: Secondary | ICD-10-CM | POA: Diagnosis not present

## 2017-12-09 DIAGNOSIS — J441 Chronic obstructive pulmonary disease with (acute) exacerbation: Secondary | ICD-10-CM | POA: Diagnosis not present

## 2017-12-22 ENCOUNTER — Other Ambulatory Visit: Payer: Self-pay | Admitting: Family Medicine

## 2017-12-22 ENCOUNTER — Ambulatory Visit (HOSPITAL_COMMUNITY)
Admission: RE | Admit: 2017-12-22 | Discharge: 2017-12-22 | Disposition: A | Discharge status: Home / Self Care | Payer: Medicare HMO | Source: Ambulatory Visit | Attending: Family Medicine | Admitting: Family Medicine

## 2017-12-22 DIAGNOSIS — R69 Illness, unspecified: Secondary | ICD-10-CM | POA: Diagnosis not present

## 2017-12-22 DIAGNOSIS — R05 Cough: Secondary | ICD-10-CM | POA: Diagnosis not present

## 2017-12-22 DIAGNOSIS — J189 Pneumonia, unspecified organism: Secondary | ICD-10-CM | POA: Insufficient documentation

## 2017-12-22 DIAGNOSIS — R0602 Shortness of breath: Secondary | ICD-10-CM | POA: Diagnosis not present

## 2017-12-22 DIAGNOSIS — R059 Cough, unspecified: Secondary | ICD-10-CM

## 2018-01-06 ENCOUNTER — Encounter: Payer: Self-pay | Admitting: Family Medicine

## 2018-02-09 DIAGNOSIS — J9611 Chronic respiratory failure with hypoxia: Secondary | ICD-10-CM | POA: Diagnosis not present

## 2018-02-09 DIAGNOSIS — I10 Essential (primary) hypertension: Secondary | ICD-10-CM | POA: Diagnosis not present

## 2018-02-09 DIAGNOSIS — J449 Chronic obstructive pulmonary disease, unspecified: Secondary | ICD-10-CM | POA: Diagnosis not present

## 2018-02-10 DIAGNOSIS — J9611 Chronic respiratory failure with hypoxia: Secondary | ICD-10-CM | POA: Diagnosis not present

## 2018-02-10 DIAGNOSIS — J449 Chronic obstructive pulmonary disease, unspecified: Secondary | ICD-10-CM | POA: Diagnosis not present

## 2018-02-16 ENCOUNTER — Other Ambulatory Visit: Payer: Self-pay | Admitting: Family Medicine

## 2018-02-16 ENCOUNTER — Telehealth: Payer: Self-pay | Admitting: Family Medicine

## 2018-02-16 MED ORDER — PREDNISONE 10 MG (21) PO TBPK: ORAL_TABLET | ORAL | 0 refills | Status: DC

## 2018-02-16 NOTE — Progress Notes (Signed)
Red 10

## 2018-02-16 NOTE — Telephone Encounter (Signed)
Noted and prednisone tabs prescribed

## 2018-02-16 NOTE — Telephone Encounter (Signed)
pls offer visit see if he thinkls he needs depo medrol, if so I will see him , if not I will send dose pack

## 2018-02-16 NOTE — Telephone Encounter (Signed)
Patient states he only needs oral prednisone. Patient appears stable and is breathing normal and is not in any distress. Is sitting calmly in lobby waiting on response from doctor. RR=14.

## 2018-02-16 NOTE — Telephone Encounter (Signed)
Pt stopped by and wants some prednisone, having trouble breathing since Friday. I asked that he have a seat.

## 2018-02-23 DIAGNOSIS — I10 Essential (primary) hypertension: Secondary | ICD-10-CM | POA: Diagnosis not present

## 2018-02-23 DIAGNOSIS — J441 Chronic obstructive pulmonary disease with (acute) exacerbation: Secondary | ICD-10-CM | POA: Diagnosis not present

## 2018-02-23 DIAGNOSIS — J9611 Chronic respiratory failure with hypoxia: Secondary | ICD-10-CM | POA: Diagnosis not present

## 2018-02-23 DIAGNOSIS — R69 Illness, unspecified: Secondary | ICD-10-CM | POA: Diagnosis not present

## 2018-03-09 ENCOUNTER — Other Ambulatory Visit: Payer: Self-pay | Admitting: Family Medicine

## 2018-03-09 DIAGNOSIS — R69 Illness, unspecified: Secondary | ICD-10-CM | POA: Diagnosis not present

## 2018-03-11 ENCOUNTER — Other Ambulatory Visit: Payer: Self-pay | Admitting: Family Medicine

## 2018-03-20 ENCOUNTER — Ambulatory Visit: Payer: Self-pay

## 2018-03-23 ENCOUNTER — Ambulatory Visit: Payer: Self-pay

## 2018-03-24 ENCOUNTER — Other Ambulatory Visit: Payer: Self-pay

## 2018-03-24 ENCOUNTER — Ambulatory Visit (INDEPENDENT_AMBULATORY_CARE_PROVIDER_SITE_OTHER): Payer: Medicare HMO | Admitting: Family Medicine

## 2018-03-24 ENCOUNTER — Encounter: Payer: Self-pay | Admitting: Family Medicine

## 2018-03-24 VITALS — BP 122/80 | HR 96 | Temp 98.2°F | Ht 67.0 in | Wt 132.1 lb

## 2018-03-24 DIAGNOSIS — Z0000 Encounter for general adult medical examination without abnormal findings: Secondary | ICD-10-CM

## 2018-03-24 DIAGNOSIS — Z87891 Personal history of nicotine dependence: Secondary | ICD-10-CM

## 2018-03-24 DIAGNOSIS — J449 Chronic obstructive pulmonary disease, unspecified: Secondary | ICD-10-CM | POA: Diagnosis not present

## 2018-03-24 NOTE — Patient Instructions (Addendum)
Thank you for coming into the office today. I appreciate the opportunity to provide you with the care for your health and wellness.  Today we ordered a  AAA (abdominal aortic aneurysm) Screening: Medicare allows for a one time ultrasound to screen for abdominal aortic aneurysm if done as a referral as part of the Welcome to Medicare exam.  Men eligible for this screening include those men between age 70-60 years of age who have smoked at least 100 cigarettes in his lifetime and/or has a family history of AAA.   As well as Low Dose CT of Chest to assess lungs since you were a smoker.  Hep B labs  Shingles    [place] YOUR HANDS WELL AND FREQUENTLY. AVOID TOUCHING YOUR FACE, UNLESS YOUR HANDS ARE FRESHLY WASHED.   GET FRESH AIR DAILY. STAY HYDRATED WITH WATER.    HEALTH MAINTENANCE RECOMMENDATIONS:  It is recommended that you get at least 30 minutes of aerobic exercise at least 5 days/week (for weight loss, you may need as much as 60-90 minutes). This can be any activity that gets your heart rate up. This can be divided in 10-15 minute intervals if needed, but try and build up your endurance at least once a week.  Weight bearing exercise is also recommended twice weekly.  Eat a healthy diet with lots of vegetables, fruits and fiber.  "Colorful" foods have a lot of vitamins (ie green vegetables, tomatoes, red peppers, etc).  Limit sweet tea, regular sodas and alcoholic beverages, all of which has a lot of calories and sugar.  Up to 2 alcoholic drinks daily may be beneficial for men (unless trying to lose weight, watch sugars).  Drink a lot of water.  Sunscreen of at least SPF 30 should be used on all sun-exposed parts of the skin when outside between the hours of 10 am and 4 pm (not just when at beach or pool, but even with exercise, golf, tennis, and yard work!)  Use a sunscreen that says "broad spectrum" so it covers both UVA and UVB rays, and make sure to reapply every 1-2 hours.   Remember to change the batteries in your smoke detectors when changing your clock times in the spring and fall.  Use your seat belt every time you are in a car, and please drive safely and not be distracted with cell phones and texting while driving.   MEDICARE PREVENTATIVE SERVICES AND PERSONALIZED PLAN for  [name] March 24, 2018   GENERAL RECOMMENDATIONS FOR GOOD HEALTH:  Supplements:  . Take a daily baby Aspirin 81mg  at bedtime for heart health unless you have a history of gastrointestinal bleed, allergy to aspirin, or are already taking higher dose Aspirin or other antiplatelet or blood thinner medication.   . Consume 1200 mg of Calcium daily through dietary calcium or supplement if you are male age 52 or older, or men 21 and older.   Men aged 75-70 should consume 1000 mg of Calcium daily. . Take 600 IU of Vitamin D daily.  Take 800 IU of Calcium daily if you are older than age 33.  . Take a general multivitamin daily.   Healthy diet: Eat a variety of foods, including fruits, vegetables, vegetable protein such as beans, lentils, tofu, and grains, such as rice.  Limit meat or animal protein, but if you eat meat, choose leans cuts such as chicken, fish, or [place].  Drink plenty of water daily.  Decrease saturated fat in the diet, avoid  lots of red meat, processed foods, sweets, fast foods, and fried foods.  Limit salt and caffeine intake.  Exercise: Aerobic exercise helps maintain good heart health. Weight bearing exercise helps keep bones and muscles working strong.  We recommend at least 30-40 minutes of exercise most days of the week.   Fall prevention: Falls are the leading cause of injuries, accidents, and accidental deaths in people over the age of 80. Falling is a real threat to your ability to live on your own.  Causes include poor eyesight or poor hearing, illness, poor lighting, throw rugs, clutter in your home, and medication side effects causing dizziness or balance  problems.  Such medications can include medications for depression, sleep problems, high blood pressure, diabetes, and heart conditions.   PREVENTION  Be sure your home is as safe as possible. Here are some tips:  Wear shoes with non-skid soles (not house slippers).   Be sure your home and outside area are well lit.   Use night lights throughout your house, including hallways and stairways.   Remove clutter and clean up spills on floors and walkways.   Remove throw rugs or fasten them to the floor with carpet tape. Tack down carpet edges.   Do not place electrical cords across pathways.   Install grab bars in your bathtub, shower, and toilet area. Towel bars should not be used as a grab bar.   Install handrails on both sides of stairways.   Do not climb on stools or stepladders. Get someone else to help with jobs that require climbing.   Do not wax your floors at all, or use a non-skid wax.   Repair uneven or unsafe sidewalks, walkways or stairs.   Keep frequently used items within reach.   Be aware of pets so you do not trip.  Get regular check-ups from your doctor, and take good care of yourself:  Have your eyes checked every year for vision changes, cataracts, glaucoma, and other eye problems. Wear eyeglasses as directed.   Have your hearing checked every 2 years, or anytime you or others think that you cannot hear well. Use hearing aids as directed.   See your caregiver if you have foot pain or corns. Sore feet can contribute to falls.   Let your caregiver know if a medicine is making you feel dizzy or making you lose your balance.   Use a cane, walker, or wheelchair as directed. Use walker or wheelchair brakes when getting in and out.   When you get up from bed, sit on the side of the bed for 1 to 2 minutes before you stand up. This will give your blood pressure time to adjust, and you will feel less dizzy.   If you need to go to the bathroom often, consider using a  bedside commode.  Disease prevention:  If you smoke or chew tobacco, find out from your caregiver how to quit. It can literally save your life, no matter how long you have been a tobacco user. If you do not use tobacco, never begin. Medicare does cover some smoking cessation counseling.  Maintain a healthy diet and normal weight. Increased weight leads to problems with blood pressure and diabetes. We check your height, weight, and BMI as part of your yearly visit.  The Body Mass Index or BMI is a way of measuring how much of your body is fat. Having a BMI above 27 increases the risk of heart disease, diabetes, hypertension, stroke and other problems  related to obesity. Your caregiver can help determine your BMI and based on it develop an exercise and dietary program to help you achieve or maintain this important measurement at a healthful level.  High blood pressure causes heart and blood vessel problems.  Persistent high blood pressure should be treated with medicine if weight loss and exercise do not work.  We check your blood pressure as part of your yearly visit.  Avoid drinking alcohol in excess (more than two drinks per day).  Avoid use of street drugs. Do not share needles with anyone. Ask for professional help if you need assistance or instructions on stopping the use of alcohol, cigarettes, and/or drugs.  Brush your teeth twice a day with fluoride toothpaste, and floss once a day. Good oral hygiene prevents tooth decay and gum disease. The problems can be painful, unattractive, and can cause other health problems. Visit your dentist for a routine oral and dental checkup and preventive care every 6-12 months.   See your eye doctor yearly for routine screening for things like glaucoma.  Look at your skin regularly.  Use a mirror to look at your back. Notify your caregivers of changes in moles, especially if there are changes in shapes, colors, a size larger than a pencil eraser, an irregular  border, or development of new moles.  Safety:  Use seatbelts 100% of the time, whether driving or as a passenger.  Use safety devices such as hearing protection if you work in environments with loud noise or significant background noise.  Use safety glasses when doing any work that could send debris in to the eyes.  Use a helmet if you ride a bike or motorcycle.  Use appropriate safety gear for contact sports.  Talk to your caregiver about gun safety.  Use sunscreen with a SPF (or skin protection factor) of 15 or greater.  Lighter skinned people are at a greater risk of skin cancer. Don't forget to also wear sunglasses in order to protect your eyes from too much damaging sunlight. Damaging sunlight can accelerate cataract formation.   If you have multiple sexual partners, or if you are not in a monogamous relationship, practice safe sex. Use condoms. Condoms are used to help reduce the spread of sexually transmitted infections (or STIs).  Consider an HIV test if you have never been tested.  Consider routine screening for STIs if you have multiple sexual partners.   Keep carbon monoxide and smoke detectors in your home functioning at all times. Change the batteries every 6 months or use a model that plugs into the wall or is hard wired in.   END OF LIFE PLANNING/ADVANCED DIRECTIVES Advance health-care planning is deciding the kind of care you want at the end of life. While alert competent adults are able to exercise their rights to make health care and financial decisions, problems arise when an individual becomes unconscious, incapacitated, or otherwise unable to communicate or make such decisions. Advance health care directives are the legal documents in which you give written instructions about your choices limited, aggressive or palliative care if, in the future, you cannot speak for yourself.  Advanced directives include the following: [place] allows you to appoint someone  to act as your health care agent to make health care decisions for you should it be determined by your health care provider that you are no longer able to make these decisions for yourself.  A Living Will is a legal document in which  you can declare that under certain conditions you desire your life not be prolonged by extraordinary or artificial means during your last illness or when you are near death. We can provide you with sample advanced directives, you can get an attorney to prepare these for you, or you can visit [place] Secretary of State's website for additional information and resources at [email or web address]  Further, I recommend you have an attorney prepare a Will and Durable Power of Attorney if you haven't done so already.  Please get [place] a copy of your health care Advanced Directives.   PREVENTATIV E CARE RECOMMENDATIONS:  Vaccinations: We recommend the following vaccinations as part of your preventative care:  Pneumococcal vaccine is recommended to protect against certain types of pneumonia.  This is normally recommended for adults age 60 or older once, or up to every 5 years for those at high risk.  The vaccine is also recommended for adults younger than 70 years old with certain underlying conditions that make them high risk for pneumonia.  Influenza vaccine is recommended to protect against seasonal influenza or "the flu." Influenza is a serious disease that can lead to hospitalization and sometimes even death. Traditional flu vaccines (called trivalent vaccines) are made to protect against three flu viruses; an influenza A (H1N1) virus, an influenza A (H3N2) virus, and an influenza B virus. In addition, there are flu vaccines made to protect against four flu viruses (called "quadrivalent" vaccines). These vaccines protect against the same viruses as the trivalent vaccine and an additional B virus.  We recommend the high dose influenza vaccine to those 65 years and  older.  Hepatitis B vaccine to protect against a form of infection of the liver by a virus acquired from blood or body fluids, particularly for high risk groups.  Td or Tdap vaccine to protect against Tetanus, diphtheria and pertussis which can be very serious.  These diseases are caused by bacteria.  Diphtheria and pertussis are spread from person to person through coughing or sneezing.  Tetanus enters the body through cuts, scratches, or wounds.  Tetanus (Lockjaw) causes painful muscle tightening and stiffness, usually all over the body.  Diphtheria can cause a thick coating to form in the back of the throat.  It can lead to breathing problems, paralysis, heart failure, and death.  Pertussis (Whooping Cough) causes severe coughing spells, which can cause difficulty breathing, vomiting and disturbed sleep.  Td or Tdap is usually given every 10 years.  Shingles vaccine to protect against Varicella Zoster if you are older than age 32, or younger than 70 years old with certain underlying illness.   Cancer Screening: Most routine colon cancer screening begins at the age of 29.  Subsequent colonoscopies are performed either every 5-10 years for normal screening, or every 2-5 years for higher risks patients, up until age 54 years of age. Annual screening is done with easy to use take-home tests to check for hidden blood in the stool called hemoccult tests.  Sigmoidoscopy or colonoscopy can detect the earliest forms of colon cancer and is life saving. These tests use a small camera at the end of a tube to directly examine the colon.   Prostate cancer screening usually begins at age 66 years old, or younger age for those with higher risk.  Those at higher risk include African-Americans or having a family history of prostate cancer. There are two types of tests for prostate cancer - Prostate-specific antigen (PSA) testing. Recent studies raise questions about prostate cancer using  PSA and you should discuss this  with your caregiver.  The other type of test is the digital rectal exam (in which your doctor's lubricated and gloved finger feels for enlargement of the prostate through the anus).  We routinely stop testing at age 63 years of age.  Osteoporosis Screening: Screening for osteoporosis usually begins at age 90 for women, and can be done as frequent as every 2 years.  However, women or men with higher risk of osteoporosis may be screened earlier than age 9.  Osteoporosis or low bone mass is diminished bone strength from alterations in bone architecture leading to bone fragility and increased fracture risk.     Cardiovascular Screening: Fat and cholesterol leaves deposits in your arteries that can block them. This causes heart disease and vessel disease elsewhere in your body.  If your cholesterol is found to be high, or if you have heart disease or certain other medical conditions, then you may need to have your cholesterol monitored frequently and be treated with medication. Cardiovascular screening in the form of lab tests for cholesterol, HDL and triglycerides can be done every 5 years.  A screening electrocardiogram can be done as part of the Welcome to Medicare physical.  Diabetes Screening: Diabetes screening can be done at least every 3 years for those with risk factors,  or every 6-35months for prediabetic patients.  Screening includes fasting blood sugar test or glucose tolerance test.  Risk factors include hypertension, dyslipidemia, obesity, previously abnormal glucose tests, family history of diabetes, age 24 years or older, and history of gestations diabetes.   AAA (abdominal aortic aneurysm) Screening: Medicare allows for a one time ultrasound to screen for abdominal aortic aneurysm if done as a referral as part of the Welcome to Medicare exam.  Men eligible for this screening include those men between age 21-54 years of age who have smoked at least 100 cigarettes in his lifetime and/or has a  family history of AAA.  HIV Screening:  Medicare allows for yearly screening for patients at high risk for contracting HIV disease.    It was a pleasure to see you and I look forward to continuing to work together on your health and well-being. Please do not hesitate to call the office if you need care or have questions about your care.  Have a wonderful day and week.  With Gratitude,  [name], DNP, AGNP-BC

## 2018-03-24 NOTE — Progress Notes (Signed)
Subjective:   [name] is a 68 y.o. male who presents for Medicare Annual/Subsequent preventive examination.  Review of Systems:   Review of Systems  Constitutional: Negative for chills, fever, malaise/fatigue and weight loss.  HENT: Negative for congestion, ear discharge, ear pain, hearing loss and sinus pain.        Stuffy nose  Eyes: Negative.        Does not see eye dr regularly   Respiratory: Positive for cough and shortness of breath.   Cardiovascular: Negative for chest pain, palpitations and leg swelling.  Gastrointestinal: Negative for abdominal pain, blood in stool, constipation and diarrhea.  Genitourinary: Negative for dysuria, frequency, hematuria and urgency.  Musculoskeletal: Negative.   Skin: Negative.   Neurological: Negative for dizziness, tremors, seizures, weakness and headaches.  Psychiatric/Behavioral: The patient is not nervous/anxious and does not have insomnia.   All other systems reviewed and are negative.       Objective:    Vitals: BP 122/80 (BP Location: Right Arm, Patient Position: Sitting, Cuff Size: Large)   Pulse 96   Temp 98.2 F (36.8 C)   Ht 5\' 7"  (1.702 m)   Wt 132 lb 1.3 oz (59.9 kg)   SpO2 94%   BMI 20.69 kg/m   Body mass index is 20.69 kg/m.    Physical Exam  Constitutional: He is oriented to person, place, and time and well-developed, well-nourished, and in no distress.  HENT:  Head: Normocephalic.  Right Ear: External ear normal.  Left Ear: External ear normal.  Nose: Nose normal.  Mouth/Throat: Oropharynx is clear and moist.  Eyes: Conjunctivae are normal. Right eye exhibits no discharge. Left eye exhibits no discharge.  Neck: Normal range of motion. Neck supple.  Cardiovascular: Normal rate, regular rhythm, normal heart sounds and intact distal pulses.  Pulmonary/Chest: Effort normal and breath sounds normal.  Abdominal: Soft. Bowel sounds are normal.  Musculoskeletal: Normal range of motion.  Neurological: He  is alert and oriented to person, place, and time.  Skin: Skin is warm and dry.  Psychiatric: Mood, memory, affect and judgment normal.  Nursing note and vitals reviewed.    Advanced Directives 03/24/2018 11/24/2017 11/24/2017 01/19/2017 03/04/2016 06/29/2015 06/15/2015  Does Patient Have a Medical Advance Directive? No No No No No No No  Would patient like information on creating a medical advance directive? Yes (ED - Information included in AVS) No - Patient declined - No - Patient declined Yes (MAU/Ambulatory/Procedural Areas - Information given) No - patient declined information No - patient declined information  Pre-existing out of facility DNR order (yellow form or pink MOST form) - - - - - - -    Tobacco Social History   Tobacco Use  Smoking Status Former Smoker  . Packs/day: 0.50  . Years: 40.00  . Pack years: 20.00  . Types: Cigarettes  . Last attempt to quit: 04/14/2015  . Years since quitting: 2.9  Smokeless Tobacco Never Used     Counseling given: Not Answered   Clinical Intake:     Pain : No/denies pain  NO     Diabetes: No   How often do you need to have someone help you when you read instructions, pamphlets, or other written materials from your doctor or pharmacy?: 5 - Always What is the last grade level you completed in school?: 11  Interpreter Needed?: No     Past Medical History:  Diagnosis Date  . Asthma   . COPD (chronic obstructive pulmonary disease) ([place])   .  CVA (cerebral vascular accident) ([place]) 2008   with temporary vision loss   . Depression   . Elevated PSA 2014   no diagnosis pf prostate cancer in 07/2014  . History of substance abuse ([place]) 01/14/2011   Marijuana  . History of tobacco abuse 01/14/2011  . Hyperlipidemia   . Hypertension   . Marijuana abuse   . Nicotine addiction   . Prediabetes 2014   History reviewed. No pertinent surgical history. History reviewed. No pertinent family history. Social History   Socioeconomic History   . Marital status: Widowed    Spouse name: Not on file  . Number of children: 1  . Years of education: Not on file  . Highest education level: Not on file  Occupational History  . Occupation: unemployed   Social Needs  . Financial resource strain: Patient refused  . Food insecurity:    Worry: Patient refused    Inability: Patient refused  . Transportation needs:    Medical: Patient refused    Non-medical: Patient refused  Tobacco Use  . Smoking status: Former Smoker    Packs/day: 0.50    Years: 40.00    Pack years: 20.00    Types: Cigarettes    Last attempt to quit: 04/14/2015    Years since quitting: 2.9  . Smokeless tobacco: Never Used  Substance and Sexual Activity  . Alcohol use: Yes    Alcohol/week: 3.0 standard drinks    Types: 3 Cans of beer per week    Comment: occasionally  . Drug use: No    Comment: pt quit 20 years ago  . Sexual activity: Not Currently  Lifestyle  . Physical activity:    Days per week: Patient refused    Minutes per session: Patient refused  . Stress: Patient refused  Relationships  . Social connections:    Talks on phone: Patient refused    Gets together: Patient refused    Attends religious service: Patient refused    Active member of club or organization: Patient refused    Attends meetings of clubs or organizations: Patient refused    Relationship status: Patient refused  Other Topics Concern  . Not on file  Social History Narrative  . Not on file    Outpatient Encounter Medications as of 03/24/2018  Medication Sig  . albuterol (PROVENTIL HFA;VENTOLIN HFA) 108 (90 Base) MCG/ACT inhaler INHALE 2 PUFFS INTO THE LUNGS EVERY 4 HOURS AS NEEDED.  [name] Kitchen albuterol (PROVENTIL) (2.5 MG/3ML) 0.083% nebulizer solution TAKE 1 AMPULE (3 MLS) BY NEBULIZATION AS DIRECTED EVERY 4 HOURS AS NEEDED.  [name] Kitchen aspirin 81 MG tablet Take 81 mg by mouth daily.  . Fluticasone-Salmeterol (ADVAIR DISKUS) 500-50 MCG/DOSE AEPB Inhale 1 puff into the lungs 2 (two) times  daily. INHALE 1 PUFF BY MOUTH TWICE A DAY. RINSE MOUTH AFTER USE.  [name] Kitchen ipratropium (ATROVENT) 0.02 % nebulizer solution INHALE 1 VIAL VIA NEBULIZER EVERY 6-8 HOURS FOR WHEEZING.  [name] Kitchen Ipratropium-Albuterol (COMBIVENT RESPIMAT) 20-100 MCG/ACT AERS respimat Inhale 1 puff into the lungs every 6 (six) hours.  . pravastatin (PRAVACHOL) 40 MG tablet Take 1 tablet (40 mg total) by mouth daily.  . predniSONE (STERAPRED UNI-PAK 21 TAB) 10 MG (21) TBPK tablet Use as directed  . triamterene-hydrochlorothiazide (MAXZIDE-25) 37.5-25 MG tablet One and a half tablets once daily every morning for blood pressure (Patient taking differently: Take 1 tablet by mouth daily. One and a half tablets once daily every morning for blood pressure)  . [DISCONTINUED] simvastatin (ZOCOR) 40 MG tablet Take  40 mg by mouth daily.   No facility-administered encounter medications on file as of 03/24/2018.     Activities of Daily Living In your present state of health, do you have any difficulty performing the following activities: 03/24/2018 11/24/2017  Hearing? N N  Vision? Y N  Difficulty concentrating or making decisions? N N  Walking or climbing stairs? Y N  Dressing or bathing? Y N  Doing errands, shopping? Y N  Some recent data might be hidden    Patient Care Team: Fayrene Helper, MD as PCP - General Irine Seal, MD as Attending Physician (Urology) Sinda Du, MD as Consulting Physician (Pulmonary Disease) Gala Romney Cristopher Estimable, MD as Consulting Physician (Gastroenterology) Danie Binder, MD as Consulting Physician (Gastroenterology)   Assessment:   This is a routine wellness examination for R[place].  Exercise Activities and Dietary recommendations    Goals    . Increase water intake     Patient would like to increase his water intake to at least 4 glasses a day.       Fall Risk Fall Risk  03/24/2018 03/24/2018 12/01/2017 11/19/2017 08/12/2017  Falls in the past year? 1 0 0 0 No  Number falls in past yr: 1 0  - - -  Injury with Fall? 0 0 - - -  Risk for fall due to : - Other (Comment) - - -  Follow up - Falls evaluation completed;Education provided - - -   Is the patient's home free of loose throw rugs in walkways, pet beds, electrical cords, etc?   no      Grab bars in the bathroom? no      Handrails on the stairs?   no      Adequate lighting?   yes  Timed Get Up and Go Performed: Less than 3 seconds.    Depression Screen PHQ 2/9 Scores 03/24/2018 03/24/2018 12/01/2017 08/12/2017  PHQ - 2 Score 4 0 - 6  PHQ- 9 Score 9 - - 6  Exception Documentation - - Patient refusal -    Cognitive Function MMSE - Mini Mental State Exam 03/04/2016  Orientation to time 2  Orientation to Place 5  Registration 3  Attention/ Calculation 0  Attention/Calculation-comments unable to spell  Recall 3  Language- name 2 objects 2  Language- repeat 1  Language- follow 3 step command 3  Language- read & follow direction 0  Language-read & follow direction-comments unable to read and write  Write a sentence 1  Copy design 1  Total score 21     6CIT Screen 03/24/2018  What Year? 0 points  What month? 0 points  What time? 0 points  Count back from 20 2 points  Months in reverse 4 points  Repeat phrase 2 points  Total Score 8    Immunization History  Administered Date(s) Administered  . Influenza Split 10/23/2011  . Influenza Whole 10/02/2010  . Influenza,inj,Quad PF,6+ Mos 11/11/2012, 11/10/2013, 10/11/2014, 08/30/2015, 09/19/2016, 08/29/2017  . Pneumococcal Conjugate-13 03/28/2014  . Pneumococcal Polysaccharide-23 05/30/2010, 06/19/2015  . Td 09/22/2003    Qualifies for Shingles Vaccine?  Yes  Screening Tests Health Maintenance  Topic Date Due  . COLONOSCOPY  03/24/2019 (Originally 07/14/2016)  . TETANUS/TDAP  03/24/2019 (Originally 09/21/2013)  . HEMOGLOBIN A1C  05/20/2018  . INFLUENZA VACCINE  Completed  . Hepatitis C Screening  Completed  . PNA vac Low Risk Adult  Completed   Cancer  Screenings: Lung: Low Dose CT Chest recommended if Age 23-80 years, 46  pack-year currently smoking OR have quit w/in 15years. Patient does qualify. Colorectal: Cologuard 11/27/17 (NEG)   Additional Screenings:  Hepatitis C Screening: Completed       Plan:   1. Asthma with COPD (chronic obstructive pulmonary disease) ([facility]) Currently controlled-reports taking medications as ordered, but has a chronic cough. Needs low dose CT screening.  - CT CHEST LUNG CA SCREEN LOW DOSE W/O CM  2. Encounter for Medicare annual wellness exam In today for AWV-needs some screenings. These are ordered. Pt educated on their need. Refused Shingles vaccine here in office, as well as going to [facility] for it.  - CT CHEST LUNG CA SCREEN LOW DOSE W/O CM  3. Stopped smoking with greater than 20 pack year history These screenings were needed for AWV  - [place] AORT[place] MEDICARE SCREENING; Future - CT CHEST LUNG CA SCREEN LOW DOSE W/O CM   I have personally reviewed and noted the following in the patient's chart:   . Medical and social history . Use of alcohol, tobacco or illicit drugs  . Current medications and supplements . Functional ability and status . Nutritional status . Physical activity . Advanced directives . List of other physicians . Hospitalizations, surgeries, and ER visits in previous 12 months . Vitals . Screenings to include cognitive, depression, and falls . Referrals and appointments  In addition, I have reviewed and discussed with patient certain preventive protocols, quality metrics, and best practice recommendations. A written personalized care plan for preventive services as well as general preventive health recommendations were provided to patient.     Perlie Mayo, NP  03/24/2018

## 2018-03-25 ENCOUNTER — Ambulatory Visit: Payer: Self-pay

## 2018-04-08 ENCOUNTER — Telehealth: Payer: Self-pay | Admitting: *Deleted

## 2018-04-08 ENCOUNTER — Other Ambulatory Visit: Payer: Self-pay

## 2018-04-08 ENCOUNTER — Ambulatory Visit (HOSPITAL_COMMUNITY): Admission: RE | Admit: 2018-04-08 | Payer: Medicare HMO | Source: Ambulatory Visit

## 2018-04-08 ENCOUNTER — Ambulatory Visit (HOSPITAL_COMMUNITY)
Admission: RE | Admit: 2018-04-08 | Discharge: 2018-04-08 | Disposition: A | Discharge status: Home / Self Care | Payer: Medicare HMO | Source: Ambulatory Visit | Attending: Family Medicine | Admitting: Family Medicine

## 2018-04-08 DIAGNOSIS — Z87891 Personal history of nicotine dependence: Secondary | ICD-10-CM | POA: Insufficient documentation

## 2018-04-08 DIAGNOSIS — I700 Atherosclerosis of aorta: Secondary | ICD-10-CM | POA: Insufficient documentation

## 2018-04-08 DIAGNOSIS — Z136 Encounter for screening for cardiovascular disorders: Secondary | ICD-10-CM | POA: Insufficient documentation

## 2018-04-08 NOTE — Telephone Encounter (Signed)
Anderson Malta wit [name] called stated that [name] was there for a lung screening but he needed the order to be CT chest without lung screening because he didn't qualify. She also said it needed a precert so she told him she would call him once everything was figured out and set it up

## 2018-04-08 NOTE — Progress Notes (Signed)
Minor Abdominal aortic atherosclerosis without significant aneurysm. Overall good results.

## 2018-04-08 NOTE — Telephone Encounter (Signed)
Has to have history of 1 pack per day for 30 yrs or 2 packs per day/15 yrs to qualify for the lung cancer screening. If he is having concerning symptoms, a regular CT can be done but not a low dose screening

## 2018-04-13 ENCOUNTER — Telehealth: Payer: Self-pay | Admitting: Family Medicine

## 2018-04-18 ENCOUNTER — Emergency Department (HOSPITAL_COMMUNITY): Payer: Medicare HMO

## 2018-04-18 ENCOUNTER — Other Ambulatory Visit: Payer: Self-pay

## 2018-04-18 ENCOUNTER — Inpatient Hospital Stay (HOSPITAL_COMMUNITY)
Admission: EM | Admit: 2018-04-18 | Discharge: 2018-04-21 | DRG: 192 | Disposition: A | Discharge status: Home / Self Care | Payer: Medicare HMO | Attending: Family Medicine | Admitting: Family Medicine

## 2018-04-18 ENCOUNTER — Encounter (HOSPITAL_COMMUNITY): Payer: Self-pay | Admitting: *Deleted

## 2018-04-18 DIAGNOSIS — R7303 Prediabetes: Secondary | ICD-10-CM | POA: Diagnosis present

## 2018-04-18 DIAGNOSIS — Z79899 Other long term (current) drug therapy: Secondary | ICD-10-CM

## 2018-04-18 DIAGNOSIS — R69 Illness, unspecified: Secondary | ICD-10-CM | POA: Diagnosis not present

## 2018-04-18 DIAGNOSIS — Z7951 Long term (current) use of inhaled steroids: Secondary | ICD-10-CM

## 2018-04-18 DIAGNOSIS — E7439 Other disorders of intestinal carbohydrate absorption: Secondary | ICD-10-CM

## 2018-04-18 DIAGNOSIS — R972 Elevated prostate specific antigen [PSA]: Secondary | ICD-10-CM | POA: Diagnosis not present

## 2018-04-18 DIAGNOSIS — Z87891 Personal history of nicotine dependence: Secondary | ICD-10-CM | POA: Diagnosis not present

## 2018-04-18 DIAGNOSIS — E785 Hyperlipidemia, unspecified: Secondary | ICD-10-CM | POA: Diagnosis present

## 2018-04-18 DIAGNOSIS — Z8673 Personal history of transient ischemic attack (TIA), and cerebral infarction without residual deficits: Secondary | ICD-10-CM | POA: Diagnosis not present

## 2018-04-18 DIAGNOSIS — R062 Wheezing: Secondary | ICD-10-CM | POA: Diagnosis not present

## 2018-04-18 DIAGNOSIS — R0609 Other forms of dyspnea: Secondary | ICD-10-CM

## 2018-04-18 DIAGNOSIS — Z7952 Long term (current) use of systemic steroids: Secondary | ICD-10-CM

## 2018-04-18 DIAGNOSIS — R0600 Dyspnea, unspecified: Secondary | ICD-10-CM | POA: Diagnosis not present

## 2018-04-18 DIAGNOSIS — R0902 Hypoxemia: Secondary | ICD-10-CM | POA: Diagnosis present

## 2018-04-18 DIAGNOSIS — I10 Essential (primary) hypertension: Secondary | ICD-10-CM | POA: Diagnosis not present

## 2018-04-18 DIAGNOSIS — R0689 Other abnormalities of breathing: Secondary | ICD-10-CM | POA: Diagnosis not present

## 2018-04-18 DIAGNOSIS — J441 Chronic obstructive pulmonary disease with (acute) exacerbation: Secondary | ICD-10-CM

## 2018-04-18 DIAGNOSIS — R069 Unspecified abnormalities of breathing: Secondary | ICD-10-CM | POA: Diagnosis not present

## 2018-04-18 DIAGNOSIS — Z7982 Long term (current) use of aspirin: Secondary | ICD-10-CM

## 2018-04-18 DIAGNOSIS — R0603 Acute respiratory distress: Secondary | ICD-10-CM | POA: Diagnosis not present

## 2018-04-18 DIAGNOSIS — R0602 Shortness of breath: Secondary | ICD-10-CM | POA: Diagnosis not present

## 2018-04-18 DIAGNOSIS — R000 Tachycardia, unspecified: Secondary | ICD-10-CM | POA: Diagnosis not present

## 2018-04-18 HISTORY — DX: Chronic obstructive pulmonary disease with (acute) exacerbation: J44.1

## 2018-04-18 LAB — CBC WITH DIFFERENTIAL/PLATELET
Abs Immature Granulocytes: 0.04 10*3/uL (ref 0.00–0.07)
Basophils Absolute: 0 10*3/uL (ref 0.0–0.1)
Basophils Relative: 0 %
Eosinophils Absolute: 0.3 10*3/uL (ref 0.0–0.5)
Eosinophils Relative: 3 %
HCT: 43 % (ref 39.0–52.0)
Hemoglobin: 13.4 g/dL (ref 13.0–17.0)
Immature Granulocytes: 1 %
Lymphocytes Relative: 42 %
Lymphs Abs: 3.7 10*3/uL (ref 0.7–4.0)
MCH: 30.3 pg (ref 26.0–34.0)
MCHC: 31.2 g/dL (ref 30.0–36.0)
MCV: 97.3 fL (ref 80.0–100.0)
Monocytes Absolute: 0.9 10*3/uL (ref 0.1–1.0)
Monocytes Relative: 10 %
Neutro Abs: 3.9 10*3/uL (ref 1.7–7.7)
Neutrophils Relative %: 44 %
Platelets: 214 10*3/uL (ref 150–400)
RBC: 4.42 MIL/uL (ref 4.22–5.81)
RDW: 12.3 % (ref 11.5–15.5)
WBC: 8.9 10*3/uL (ref 4.0–10.5)
nRBC: 0 % (ref 0.0–0.2)

## 2018-04-18 LAB — COMPREHENSIVE METABOLIC PANEL
ALT: 16 U/L (ref 0–44)
AST: 22 U/L (ref 15–41)
Albumin: 4 g/dL (ref 3.5–5.0)
Alkaline Phosphatase: 59 U/L (ref 38–126)
Anion gap: 9 (ref 5–15)
BUN: 12 mg/dL (ref 8–23)
CO2: 31 mmol/L (ref 22–32)
Calcium: 9.1 mg/dL (ref 8.9–10.3)
Chloride: 102 mmol/L (ref 98–111)
Creatinine, Ser: 0.85 mg/dL (ref 0.61–1.24)
GFR calc Af Amer: >60 mL/min (ref >60)
GFR calc non Af Amer: >60 mL/min (ref >60)
Glucose, Bld: 151 mg/dL — ABNORMAL HIGH (ref 70–99)
Potassium: 3.8 mmol/L (ref 3.5–5.1)
Sodium: 142 mmol/L (ref 135–145)
Total Bilirubin: 0.4 mg/dL (ref 0.3–1.2)
Total Protein: 7.1 g/dL (ref 6.5–8.1)

## 2018-04-18 LAB — TROPONIN I: Troponin I: <0.03 ng/mL (ref <0.03)

## 2018-04-18 LAB — D-DIMER, QUANTITATIVE: D-Dimer, Quant: 0.33 ug/mL-FEU (ref 0.00–0.50)

## 2018-04-18 MED ORDER — UMECLIDINIUM BROMIDE 62.5 MCG/INH IN AEPB
1.0000 | INHALATION_SPRAY | Freq: Every day | RESPIRATORY_TRACT | Status: DC
  Administered 2018-04-19 – 2018-04-21 (×3): 1 via RESPIRATORY_TRACT
  Filled 2018-04-18: qty 7

## 2018-04-18 MED ORDER — LEVALBUTEROL TARTRATE 45 MCG/ACT IN AERO
2.0000 | INHALATION_SPRAY | Freq: Three times a day (TID) | RESPIRATORY_TRACT | Status: DC
  Filled 2018-04-18: qty 15

## 2018-04-18 MED ORDER — ASPIRIN EC 81 MG PO TBEC
81.0000 mg | DELAYED_RELEASE_TABLET | Freq: Every day | ORAL | Status: DC
  Administered 2018-04-19 – 2018-04-21 (×3): 81 mg via ORAL
  Filled 2018-04-18 (×4): qty 1

## 2018-04-18 MED ORDER — FLUTICASONE FUROATE-VILANTEROL 200-25 MCG/INH IN AEPB
1.0000 | INHALATION_SPRAY | Freq: Every day | RESPIRATORY_TRACT | Status: DC
  Administered 2018-04-19 – 2018-04-21 (×3): 1 via RESPIRATORY_TRACT
  Filled 2018-04-18: qty 28

## 2018-04-18 MED ORDER — SODIUM CHLORIDE 0.9% FLUSH: 3.0000 mL | INTRAVENOUS | Status: DC | PRN

## 2018-04-18 MED ORDER — METHYLPREDNISOLONE SODIUM SUCC 125 MG IJ SOLR
80.0000 mg | Freq: Three times a day (TID) | INTRAMUSCULAR | Status: DC
  Administered 2018-04-19 – 2018-04-20 (×7): 80 mg via INTRAVENOUS
  Filled 2018-04-18 (×8): qty 2

## 2018-04-18 MED ORDER — LORAZEPAM 2 MG/ML IJ SOLN
0.5000 mg | Freq: Once | INTRAMUSCULAR | Status: AC
  Administered 2018-04-18: 0.5 mg via INTRAVENOUS
  Filled 2018-04-18: qty 1

## 2018-04-18 MED ORDER — PRAVASTATIN SODIUM 40 MG PO TABS
40.0000 mg | ORAL_TABLET | Freq: Every day | ORAL | Status: DC
  Administered 2018-04-19 – 2018-04-21 (×3): 40 mg via ORAL
  Filled 2018-04-18 (×3): qty 1

## 2018-04-18 MED ORDER — SODIUM CHLORIDE 0.9 % IV SOLN
250.0000 mL | INTRAVENOUS | Status: DC | PRN
  Administered 2018-04-19: 250 mL via INTRAVENOUS

## 2018-04-18 MED ORDER — ACETAMINOPHEN 650 MG RE SUPP: 650.0000 mg | Freq: Four times a day (QID) | RECTAL | Status: DC | PRN

## 2018-04-18 MED ORDER — ENOXAPARIN SODIUM 40 MG/0.4ML ~~LOC~~ SOLN
40.0000 mg | SUBCUTANEOUS | Status: DC
  Administered 2018-04-19: 40 mg via SUBCUTANEOUS
  Filled 2018-04-18 (×2): qty 0.4

## 2018-04-18 MED ORDER — SODIUM CHLORIDE 0.9% FLUSH
3.0000 mL | Freq: Two times a day (BID) | INTRAVENOUS | Status: DC
  Administered 2018-04-19 – 2018-04-21 (×6): 3 mL via INTRAVENOUS

## 2018-04-18 MED ORDER — TRIAMTERENE-HCTZ 37.5-25 MG PO TABS
1.0000 | ORAL_TABLET | Freq: Every day | ORAL | Status: DC
  Administered 2018-04-19 – 2018-04-21 (×3): 1 via ORAL
  Filled 2018-04-18 (×3): qty 1

## 2018-04-18 MED ORDER — LEVALBUTEROL HCL 1.25 MG/0.5ML IN NEBU
1.2500 mg | INHALATION_SOLUTION | Freq: Four times a day (QID) | RESPIRATORY_TRACT | Status: DC | PRN
  Administered 2018-04-19: 06:00:00 1.25 mg via RESPIRATORY_TRACT
  Filled 2018-04-18: qty 0.5

## 2018-04-18 MED ORDER — ACETAMINOPHEN 325 MG PO TABS: 650.0000 mg | ORAL_TABLET | Freq: Four times a day (QID) | ORAL | Status: DC | PRN

## 2018-04-18 MED ORDER — SODIUM CHLORIDE 0.9 % IV SOLN
500.0000 mg | INTRAVENOUS | Status: DC
  Administered 2018-04-19: 500 mg via INTRAVENOUS
  Filled 2018-04-18: qty 500

## 2018-04-18 NOTE — H&P (Addendum)
TRH H&P    Patient Demographics:    [name], is a 68 y.o. male  [name]: [id number]  DOB - 03-Oct-1950  Admit Date - 04/18/2018  Referring MD/NP/PA: Nat Christen  Outpatient Primary MD for the patient is Fayrene Helper, MD  Sinda Du (pulmonary)  Patient coming from: home  Chief complaint- Dyspnea, wheezing   HPI:    [name]  is a 68 y.o. male,w Copd (not on home o2), hypertension,  hyperlipidemia, Glucose intolerance, psa elevation, apparently presents due to acute dyspnea.  Pt notes slight dry cough. Denies fever, chills, sore throat, cp, palp, n/v, diarrhea, brbpr, alteration in sense of smell or taste.  Pt denies any sick contacts, pt denies any known covid exposure, pt denies any recent travel outside the state.  He lives at home.    Pt states tried albuterol neb without benefit,  Denies noncompliance with Advair.  Pt states has had this Copd exacerbation in the past.   Pt called EMS , pox 72% on RA, and hr 165,  Pt given 1 duoneb and solumedrol 125mg  iv x1 en route and brought to ER for evaluation.    In ED,  T 97.9  P 118-143  R 22-31, Bp 130/70  Pox 93% on 2L [place] Wt 59.9  Na 142, K 3.8, Bun 12, Creatinine 0.85 Ast 22, Alt 16\ Glucose 151 Wbc 8.9, Hgb 13.4, Plt 214 Trop <0.03 D dimer  0.33    Pt given ativan 0.5mg  iv x1 in ER and placed on o2.   Pt will be admitted for dyspnea secondary to Copd exacerbation.        Review of systems:    In addition to the HPI above,  No Fever-chills, No Headache, No changes with Vision or hearing, No problems swallowing food or Liquids, No Chest pain,  No Abdominal pain, No Nausea or Vomiting, bowel movements are regular, No Blood in stool or Urine, No dysuria, No new skin rashes or bruises, No new joints pains-aches,  No new weakness, tingling, numbness in any extremity, No recent weight gain or loss, No polyuria, polydypsia or  polyphagia, No significant Mental Stressors.  All other systems reviewed and are negative.    Past History of the following :    Past Medical History:  Diagnosis Date   Asthma    COPD (chronic obstructive pulmonary disease) ([place])    CVA (cerebral vascular accident) ([place]) 2008   with temporary vision loss    Depression    Elevated PSA 2014   no diagnosis pf prostate cancer in 07/2014   History of substance abuse ([place]) 01/14/2011   Marijuana   History of tobacco abuse 01/14/2011   Hyperlipidemia    Hypertension    Marijuana abuse    Nicotine addiction    Prediabetes 2014      History reviewed. No pertinent surgical history.   Pt can't recall any surgeries    Social History:      Social History   Tobacco Use   Smoking status: Former Smoker  Packs/day: 0.50    Years: 40.00    Pack years: 20.00    Types: Cigarettes    Last attempt to quit: 04/14/2015    Years since quitting: 3.0   Smokeless tobacco: Never Used  Substance Use Topics   Alcohol use: Yes    Alcohol/week: 3.0 standard drinks    Types: 3 Cans of beer per week    Comment: occasionally       Family History :    History reviewed. No pertinent family history. Pt states no family hx of Copd   Home Medications:   Prior to Admission medications   Medication Sig Start Date End Date Taking? Authorizing Provider  albuterol (PROVENTIL HFA;VENTOLIN HFA) 108 (90 Base) MCG/ACT inhaler INHALE 2 PUFFS INTO THE LUNGS EVERY 4 HOURS AS NEEDED. 03/11/18  Yes Fayrene Helper, MD  albuterol (PROVENTIL) (2.5 MG/3ML) 0.083% nebulizer solution TAKE 1 AMPULE (3 MLS) BY NEBULIZATION AS DIRECTED EVERY 4 HOURS AS NEEDED. 03/09/18  Yes Fayrene Helper, MD  aspirin 81 MG tablet Take 81 mg by mouth daily.   Yes [provider]  Fluticasone-Salmeterol (ADVAIR DISKUS) 500-50 MCG/DOSE AEPB Inhale 1 puff into the lungs 2 (two) times daily. INHALE 1 PUFF BY MOUTH TWICE A DAY. RINSE MOUTH AFTER USE. 03/09/18   Yes Fayrene Helper, MD  pravastatin (PRAVACHOL) 40 MG tablet Take 1 tablet (40 mg total) by mouth daily. 08/12/17  Yes Fayrene Helper, MD  triamterene-hydrochlorothiazide (MAXZIDE-25) 37.5-25 MG tablet One and a half tablets once daily every morning for blood pressure Patient taking differently: Take 1 tablet by mouth daily. One and a half tablets once daily every morning for blood pressure 08/12/17  Yes Fayrene Helper, MD  ipratropium (ATROVENT) 0.02 % nebulizer solution INHALE 1 VIAL VIA NEBULIZER EVERY 6-8 HOURS FOR WHEEZING. 03/09/18   Fayrene Helper, MD  Ipratropium-Albuterol (COMBIVENT RESPIMAT) 20-100 MCG/ACT AERS respimat Inhale 1 puff into the lungs every 6 (six) hours.    [provider]  predniSONE (STERAPRED UNI-PAK 21 TAB) 10 MG (21) TBPK tablet Use as directed 02/16/18   Fayrene Helper, MD  simvastatin (ZOCOR) 40 MG tablet Take 40 mg by mouth daily. 10/02/10 03/18/11  Fayrene Helper, MD     Allergies:    No Known Allergies   Physical Exam:   Vitals  Blood pressure (!) 157/98, pulse (!) 124, temperature 98.5 F (36.9 C), temperature source Oral, resp. rate (!) 22, height 5\' 6"  (1.676 m), weight 56.9 kg, SpO2 95 %.  1.  General: Axox3,   2. Psychiatric: euthymic  3. Neurologic: cn2-12 intact, reflexes 2+ symmetric, diffuse with downgoing toes bilaterally, motor 5/5 in all 4 ext, pinprik intact  4. HEENMT:  Anicteric, + arcus senilis,  Pupils 90mm, symmetric, direct, consensual, near intact Mmm  5. Respiratory : + bilateral exp wheezing, no crackles.   6. Cardiovascular : Tachy s1, s2, no m/g/r  7. Gastrointestinal:  Abd: soft, nt, nd, +bs,   8. Skin:  No c/c/e, normal turgor  9.Musculoskeletal:  Good ROM    Data Review:    CBC Recent Labs  Lab 04/18/18 1942  WBC 8.9  HGB 13.4  HCT 43.0  PLT 214  MCV 97.3  MCH 30.3  MCHC 31.2  RDW 12.3  LYMPHSABS 3.7  MONOABS 0.9  EOSABS 0.3  BASOSABS 0.0    ------------------------------------------------------------------------------------------------------------------  Results for orders placed or performed during the hospital encounter of 04/18/18 (from the past 48 hour(s))  CBC with Differential  Status: None   Collection Time: 04/18/18  7:42 PM  Result Value Ref Range   WBC 8.9 4.0 - 10.5 K/uL   RBC 4.42 4.22 - 5.81 MIL/uL   Hemoglobin 13.4 13.0 - 17.0 g/dL   HCT 43.0 39.0 - 52.0 %   MCV 97.3 80.0 - 100.0 fL   MCH 30.3 26.0 - 34.0 pg   MCHC 31.2 30.0 - 36.0 g/dL   RDW 12.3 11.5 - 15.5 %   Platelets 214 150 - 400 K/uL   nRBC 0.0 0.0 - 0.2 %   Neutrophils Relative % 44 %   Neutro Abs 3.9 1.7 - 7.7 K/uL   Lymphocytes Relative 42 %   Lymphs Abs 3.7 0.7 - 4.0 K/uL   Monocytes Relative 10 %   Monocytes Absolute 0.9 0.1 - 1.0 K/uL   Eosinophils Relative 3 %   Eosinophils Absolute 0.3 0.0 - 0.5 K/uL   Basophils Relative 0 %   Basophils Absolute 0.0 0.0 - 0.1 K/uL   Immature Granulocytes 1 %   Abs Immature Granulocytes 0.04 0.00 - 0.07 K/uL    Comment: Performed at [facility], [address] [place], [place] [address]  Comprehensive metabolic panel     Status: Abnormal   Collection Time: 04/18/18  7:42 PM  Result Value Ref Range   Sodium 142 135 - 145 mmol/L   Potassium 3.8 3.5 - 5.1 mmol/L   Chloride 102 98 - 111 mmol/L   CO2 31 22 - 32 mmol/L   Glucose, Bld 151 (H) 70 - 99 mg/dL   BUN 12 8 - 23 mg/dL   Creatinine, Ser 0.85 0.61 - 1.24 mg/dL   Calcium 9.1 8.9 - 10.3 mg/dL   Total Protein 7.1 6.5 - 8.1 g/dL   Albumin 4.0 3.5 - 5.0 g/dL   AST 22 15 - 41 U/L   ALT 16 0 - 44 U/L   Alkaline Phosphatase 59 38 - 126 U/L   Total Bilirubin 0.4 0.3 - 1.2 mg/dL   GFR calc non Af Amer >60 >60 mL/min   GFR calc Af Amer >60 >60 mL/min   Anion gap 9 5 - 15    Comment: Performed at [facility], [address] [place], [place] [address]  Troponin I - Once     Status: None   Collection Time: 04/18/18  7:42 PM  Result Value Ref  Range   Troponin I <0.03 <0.03 ng/mL    Comment: Performed at [facility], [address], [name], [place] [address]  Blood culture (routine x 2)     Status: None (Preliminary result)   Collection Time: 04/18/18  8:22 PM  Result Value Ref Range   Specimen Description BLOOD RIGHT FOREARM    Special Requests      BOTTLES DRAWN AEROBIC AND ANAEROBIC Blood Culture adequate volume Performed at [facility], [address], [place], [place] [address]    Culture PENDING    Report Status PENDING   D-dimer, quantitative (not at [facility])     Status: None   Collection Time: 04/18/18  8:25 PM  Result Value Ref Range   D-Dimer, Quant 0.33 0.00 - 0.50 ug/mL-FEU    Comment: (NOTE) At the manufacturer cut-off of 0.50 ug/mL FEU, this assay has been documented to exclude PE with a sensitivity and negative predictive value of 97 to 99%.  At this time, this assay has not been approved by the FDA to exclude DVT/VTE. Results should be correlated with clinical presentation. Performed at [facility], [address] [place], [place] [address]     Chemistries  Recent Labs  Lab 04/18/18 1942  NA 142  K 3.8  CL 102  CO2 31  GLUCOSE 151*  BUN 12  CREATININE 0.85  CALCIUM 9.1  AST 22  ALT 16  ALKPHOS 59  BILITOT 0.4   ------------------------------------------------------------------------------------------------------------------  ------------------------------------------------------------------------------------------------------------------ GFR: Estimated Creatinine Clearance: 66 mL/min (by C-G formula based on SCr of 0.85 mg/dL). Liver Function Tests: Recent Labs  Lab 04/18/18 1942  AST 22  ALT 16  ALKPHOS 59  BILITOT 0.4  PROT 7.1  ALBUMIN 4.0   No results for input(s): LIPASE, AMYLASE in the last 168 hours. No results for input(s): AMMONIA in the last 168 hours. Coagulation Profile: No results for input(s): INR, PROTIME in the last 168 hours. Cardiac Enzymes: Recent Labs  Lab  04/18/18 1942  TROPONINI <0.03   BNP (last 3 results) No results for input(s): PROBNP in the last 8760 hours. HbA1C: No results for input(s): HGBA1C in the last 72 hours. CBG: No results for input(s): GLUCAP in the last 168 hours. Lipid Profile: No results for input(s): CHOL, HDL, LDLCALC, TRIG, CHOLHDL, LDLDIRECT in the last 72 hours. Thyroid Function Tests: No results for input(s): TSH, T4TOTAL, FREET4, T3FREE, THYROIDAB in the last 72 hours. Anemia Panel: No results for input(s): VITAMINB12, FOLATE, FERRITIN, TIBC, IRON, RETICCTPCT in the last 72 hours.  --------------------------------------------------------------------------------------------------------------- Urine analysis:    Component Value Date/Time   COLORURINE YELLOW 02/17/2011 2236   APPEARANCEUR CLEAR 02/17/2011 2236   LABSPEC >1.030 (H) 02/17/2011 2236   PHURINE 5.5 02/17/2011 2236   GLUCOSEU NEGATIVE 02/17/2011 2236   GLUCOSEU 100 (A) 07/30/2006 0507   HGBUR TRACE (A) 02/17/2011 2236   BILIRUBINUR NEGATIVE 02/17/2011 2236   KETONESUR NEGATIVE 02/17/2011 2236   PROTEINUR NEGATIVE 02/17/2011 2236   UROBILINOGEN 0.2 02/17/2011 2236   NITRITE NEGATIVE 02/17/2011 2236   LEUKOCYTESUR NEGATIVE 02/17/2011 2236      Imaging Results:    Dg Chest Port 1 View  Result Date: 04/18/2018 CLINICAL DATA:  Shortness of breath. Wheezing. EXAM: PORTABLE CHEST 1 VIEW COMPARISON:  12/22/2017 FINDINGS: Chronic hyperinflation. Increased bronchial thickening above baseline. Biapical pleuroparenchymal scarring. No acute airspace disease, pulmonary edema, pleural effusion or pneumothorax. Normal heart size and mediastinal contours. IMPRESSION: Chronic hyperinflation. Increased bronchial thickening consistent with acute bronchitis. Electronically Signed   By: Keith Rake M.D.   On: 04/18/2018 21:20   Ekg:  ST at 135, nl axis, no st-t changes c/w ischemia   Assessment & Plan:    Principal Problem:   COPD with acute  exacerbation (HCC) Active Problems:   PSA, INCREASED   Tachycardia   Dyspnea   Glucose intolerance  Dyspnea secondary to Copd exacerbation Solumedrol 80mg  iv q8h Zithromax 500mg  iv qday  Start incruse 1puff qday Continue Advair 1puff bid DC home albuterol due to tachycardia Use Xopenex 1 neb tid, and q6h prn  Tachycardia ? Due to breathing treatments Check TSH Check trop I q6h x3 Check Cardiac echo If persistent consider CTA chest  Glucose intolerance Monitor fsbs ac and qhs,  If bs >200 please contact physician for further instructions  Hyperlipidemia Cont Pravastatin 40mg  po qhs  Hypertension Cont Maxide   Psa elevation Please f/u with Dr. Moshe Cipro    DVT Prophylaxis-   Lovenox - SCDs   AM Labs Ordered, also please review Full Orders  Family Communication: Admission, patients condition and plan of care including tests being ordered have been discussed with the  patient who indicate understanding and agree with the plan and Code Status.  Code Status:   FULL CODE  Admission status:  Inpatient: Based on patients clinical presentation and evaluation of above clinical data, I have made determination that patient meets Inpatient criteria at this time.based on severity of hypoxia pox 72% and tachycardia  Time spent in minutes : 70    Jani Gravel M.D on 04/18/2018 at 11:31 PM

## 2018-04-18 NOTE — ED Provider Notes (Signed)
[facility] EMERGENCY DEPARTMENT Provider Note   CSN: [id number] Arrival date & time: 04/18/18  1924    History   Chief Complaint Chief Complaint  Patient presents with  . Respiratory Distress    HPI [name] is a 68 y.o. male.     Level 5 caveat for acuity of condition.  Patient presents with dyspnea and wheezing since this morning.  He has a known history of COPD, CVA, substance abuse, tobacco abuse, many others.  Questionable prodromal URI.  Initial pulse ox via EMS was 72% on room air.  He was given a nebulizer treatment and IV Solu-Medrol in route.  He is still tachycardic.     Past Medical History:  Diagnosis Date  . Asthma   . COPD (chronic obstructive pulmonary disease) ([place])   . CVA (cerebral vascular accident) ([place]) 2008   with temporary vision loss   . Depression   . Elevated PSA 2014   no diagnosis pf prostate cancer in 07/2014  . History of substance abuse ([place]) 01/14/2011   Marijuana  . History of tobacco abuse 01/14/2011  . Hyperlipidemia   . Hypertension   . Marijuana abuse   . Nicotine addiction   . Prediabetes 2014    Patient Active Problem List   Diagnosis Date Noted  . Atypical pneumonia 12/02/2017  . COPD exacerbation ([place]) 11/24/2017  . Epidermal cyst of face 01/21/2017  . Hospital discharge follow-up 06/19/2015  . Acute respiratory failure with hypoxemia ([place]) 05/30/2015  . Social isolation 03/17/2013  . Asthma with COPD (chronic obstructive pulmonary disease) ([place]) 11/12/2012  . History of substance abuse ([place]) 01/14/2011  . Anemia 01/14/2011  . ECHOCARDIOGRAM, ABNORMAL 03/06/2010  . Diabetes mellitus type 2, controlled ([place]) 10/26/2009  . Vitamin D deficiency 06/28/2009  . IGT (impaired glucose tolerance) 01/25/2009  . PSA, INCREASED 01/25/2009  . Hyperlipidemia 07/30/2007  . Essential hypertension 07/30/2007    History reviewed. No pertinent surgical history.      Home Medications    Prior to Admission medications    Medication Sig Start Date End Date Taking? Authorizing Provider  albuterol (PROVENTIL HFA;VENTOLIN HFA) 108 (90 Base) MCG/ACT inhaler INHALE 2 PUFFS INTO THE LUNGS EVERY 4 HOURS AS NEEDED. 03/11/18  Yes Fayrene Helper, MD  albuterol (PROVENTIL) (2.5 MG/3ML) 0.083% nebulizer solution TAKE 1 AMPULE (3 MLS) BY NEBULIZATION AS DIRECTED EVERY 4 HOURS AS NEEDED. 03/09/18  Yes Fayrene Helper, MD  aspirin 81 MG tablet Take 81 mg by mouth daily.   Yes [provider]  Fluticasone-Salmeterol (ADVAIR DISKUS) 500-50 MCG/DOSE AEPB Inhale 1 puff into the lungs 2 (two) times daily. INHALE 1 PUFF BY MOUTH TWICE A DAY. RINSE MOUTH AFTER USE. 03/09/18  Yes Fayrene Helper, MD  ipratropium (ATROVENT) 0.02 % nebulizer solution INHALE 1 VIAL VIA NEBULIZER EVERY 6-8 HOURS FOR WHEEZING. 03/09/18  Yes Fayrene Helper, MD  Ipratropium-Albuterol (COMBIVENT RESPIMAT) 20-100 MCG/ACT AERS respimat Inhale 1 puff into the lungs every 6 (six) hours.   Yes [provider]  pravastatin (PRAVACHOL) 40 MG tablet Take 1 tablet (40 mg total) by mouth daily. 08/12/17  Yes Fayrene Helper, MD  triamterene-hydrochlorothiazide (MAXZIDE-25) 37.5-25 MG tablet One and a half tablets once daily every morning for blood pressure Patient taking differently: Take 1 tablet by mouth daily. One and a half tablets once daily every morning for blood pressure 08/12/17  Yes Fayrene Helper, MD  predniSONE (STERAPRED UNI-PAK 21 TAB) 10 MG (21) TBPK tablet Use as directed  02/16/18   Fayrene Helper, MD  simvastatin (ZOCOR) 40 MG tablet Take 40 mg by mouth daily. 10/02/10 03/18/11  Fayrene Helper, MD    Family History No family history on file.  Social History Social History   Tobacco Use  . Smoking status: Former Smoker    Packs/day: 0.50    Years: 40.00    Pack years: 20.00    Types: Cigarettes    Last attempt to quit: 04/14/2015    Years since quitting: 3.0  . Smokeless tobacco: Never Used  Substance Use  Topics  . Alcohol use: Yes    Alcohol/week: 3.0 standard drinks    Types: 3 Cans of beer per week    Comment: occasionally  . Drug use: No    Comment: pt quit 20 years ago     Allergies   Patient has no known allergies.   Review of Systems Review of Systems  Unable to perform ROS: Acuity of condition     Physical Exam Updated Vital Signs BP (!) 164/122   Pulse (!) 129   Temp 97.9 F (36.6 C) (Oral)   Resp (!) 27   Ht 5\' 7"  (1.702 m)   Wt 59.9 kg   SpO2 100%   BMI 20.67 kg/m   Physical Exam Vitals signs and nursing note reviewed.  Constitutional:      Appearance: He is well-developed.     Comments: Pulse ox 100% on 2 L  HENT:     Head: Normocephalic and atraumatic.  Eyes:     Conjunctiva/sclera: Conjunctivae normal.  Neck:     Musculoskeletal: Neck supple.  Cardiovascular:     Rate and Rhythm: Regular rhythm. Tachycardia present.  Pulmonary:     Comments: Tachypneic, exp wheezes Abdominal:     General: Bowel sounds are normal.     Palpations: Abdomen is soft.  Musculoskeletal: Normal range of motion.  Skin:    General: Skin is warm and dry.  Neurological:     Mental Status: He is alert and oriented to person, place, and time.  Psychiatric:        Behavior: Behavior normal.      ED Treatments / Results  Labs (all labs ordered are listed, but only abnormal results are displayed) Labs Reviewed  COMPREHENSIVE METABOLIC PANEL - Abnormal; Notable for the following components:      Result Value   Glucose, Bld 151 (*)    All other components within normal limits  CULTURE, BLOOD (ROUTINE X 2)  CULTURE, BLOOD (ROUTINE X 2)  CBC WITH DIFFERENTIAL/PLATELET  TROPONIN I  D-DIMER, QUANTITATIVE (NOT AT [facility])  RAPID URINE DRUG SCREEN, HOSP PERFORMED    EKG EKG Interpretation  Date/Time:  Saturday April 18 2018 19:42:22 EDT Ventricular Rate:  136 PR Interval:    QRS Duration: 169 QT Interval:  283 QTC Calculation: 426 R Axis:   70 Text  Interpretation:  Sinus tachycardia Ventricular premature complex IVCD, consider atypical RBBB Probable left ventricular hypertrophy Lateral infarct, acute Anterior Q waves, possibly due to LVH Artifact in lead(s) I II III aVR aVL aVF V1 V2 V3 V4 V5 V6 >>> Acute MI <<< Confirmed by Nat Christen [phone]) on 04/18/2018 9:10:44 PM   Radiology Dg Chest Port 1 View  Result Date: 04/18/2018 CLINICAL DATA:  Shortness of breath. Wheezing. EXAM: PORTABLE CHEST 1 VIEW COMPARISON:  12/22/2017 FINDINGS: Chronic hyperinflation. Increased bronchial thickening above baseline. Biapical pleuroparenchymal scarring. No acute airspace disease, pulmonary edema, pleural effusion or pneumothorax. Normal heart size  and mediastinal contours. IMPRESSION: Chronic hyperinflation. Increased bronchial thickening consistent with acute bronchitis. Electronically Signed   By: Keith Rake M.D.   On: 04/18/2018 21:20    Procedures Procedures (including critical care time)  Medications Ordered in ED Medications  LORazepam (ATIVAN) injection 0.5 mg (0.5 mg Intravenous Given 04/18/18 1947)     Initial Impression / Assessment and Plan / ED Course  I have reviewed the triage vital signs and the nursing notes.  Pertinent labs & imaging results that were available during my care of the patient were reviewed by me and considered in my medical decision making (see chart for details).        Patient presents with COPD exacerbation.  Could be COVID-19.  Chest x-ray suggests acute bronchitis.  He remains tachycardic.  He is oxygenating 100% on 2 L nasal cannula.  Will admit to general medicine.   CRITICAL CARE Performed by: Nat Christen Total critical care time: 30 minutes Critical care time was exclusive of separately billable procedures and treating other patients. Critical care was necessary to treat or prevent imminent or life-threatening deterioration. Critical care was time spent personally by me on the following  activities: development of treatment plan with patient and/or surrogate as well as nursing, discussions with consultants, evaluation of patient's response to treatment, examination of patient, obtaining history from patient or surrogate, ordering and performing treatments and interventions, ordering and review of laboratory studies, ordering and review of radiographic studies, pulse oximetry and re-evaluation of patient's condition.   JAIEL SARACENO was evaluated in Emergency Department on 04/18/2018 for the symptoms described in the history of present illness. He was evaluated in the context of the global COVID-19 pandemic, which necessitated consideration that the patient might be at risk for infection with the SARS-CoV-2 virus that causes COVID-19. Institutional protocols and algorithms that pertain to the evaluation of patients at risk for COVID-19 are in a state of rapid change based on information released by regulatory bodies including the CDC and federal and state organizations. These policies and algorithms were followed during the patient's care in the ED.  Final Clinical Impressions(s) / ED Diagnoses   Final diagnoses:  Respiratory distress    ED Discharge Orders    None       Nat Christen, MD 04/18/18 2146

## 2018-04-18 NOTE — ED Triage Notes (Signed)
Pt c/o sob that started this am with wheezing, pulse ox 72% on RA and hr of 165 upon ems arrival. Pt given one duoneb and 125mg  solumedrol enroute to er.

## 2018-04-19 DIAGNOSIS — R0600 Dyspnea, unspecified: Secondary | ICD-10-CM

## 2018-04-19 LAB — CBC
HCT: 41.3 % (ref 39.0–52.0)
Hemoglobin: 13.1 g/dL (ref 13.0–17.0)
MCH: 30.1 pg (ref 26.0–34.0)
MCHC: 31.7 g/dL (ref 30.0–36.0)
MCV: 94.9 fL (ref 80.0–100.0)
Platelets: 212 10*3/uL (ref 150–400)
RBC: 4.35 MIL/uL (ref 4.22–5.81)
RDW: 12.4 % (ref 11.5–15.5)
WBC: 6.9 10*3/uL (ref 4.0–10.5)
nRBC: 0 % (ref 0.0–0.2)

## 2018-04-19 LAB — COMPREHENSIVE METABOLIC PANEL
ALT: 19 U/L (ref 0–44)
AST: 29 U/L (ref 15–41)
Albumin: 3.9 g/dL (ref 3.5–5.0)
Alkaline Phosphatase: 62 U/L (ref 38–126)
Anion gap: 10 (ref 5–15)
BUN: 14 mg/dL (ref 8–23)
CO2: 28 mmol/L (ref 22–32)
Calcium: 9.2 mg/dL (ref 8.9–10.3)
Chloride: 103 mmol/L (ref 98–111)
Creatinine, Ser: 0.78 mg/dL (ref 0.61–1.24)
GFR calc Af Amer: >60 mL/min (ref >60)
GFR calc non Af Amer: >60 mL/min (ref >60)
Glucose, Bld: 160 mg/dL — ABNORMAL HIGH (ref 70–99)
Potassium: 4.1 mmol/L (ref 3.5–5.1)
Sodium: 141 mmol/L (ref 135–145)
Total Bilirubin: 0.6 mg/dL (ref 0.3–1.2)
Total Protein: 7 g/dL (ref 6.5–8.1)

## 2018-04-19 LAB — GLUCOSE, CAPILLARY
Glucose-Capillary: 145 mg/dL — ABNORMAL HIGH (ref 70–99)
Glucose-Capillary: 193 mg/dL — ABNORMAL HIGH (ref 70–99)
Glucose-Capillary: 157 mg/dL — ABNORMAL HIGH (ref 70–99)

## 2018-04-19 LAB — RAPID URINE DRUG SCREEN, HOSP PERFORMED
Amphetamines: NOT DETECTED
Barbiturates: NOT DETECTED
Benzodiazepines: POSITIVE — AB
Cocaine: NOT DETECTED
Opiates: NOT DETECTED
Tetrahydrocannabinol: NOT DETECTED

## 2018-04-19 LAB — TROPONIN I: Troponin I: <0.03 ng/mL (ref <0.03)

## 2018-04-19 LAB — TSH: TSH: 0.396 u[IU]/mL (ref 0.350–4.500)

## 2018-04-19 MED ORDER — ALBUTEROL SULFATE (2.5 MG/3ML) 0.083% IN NEBU: 2.5000 mg | INHALATION_SOLUTION | RESPIRATORY_TRACT | Status: DC | PRN

## 2018-04-19 MED ORDER — ALBUTEROL SULFATE HFA 108 (90 BASE) MCG/ACT IN AERS
2.0000 | INHALATION_SPRAY | Freq: Four times a day (QID) | RESPIRATORY_TRACT | Status: DC
  Administered 2018-04-19 – 2018-04-21 (×8): 2 via RESPIRATORY_TRACT
  Filled 2018-04-19: qty 6.7

## 2018-04-19 MED ORDER — DOXYCYCLINE HYCLATE 100 MG PO TABS
100.0000 mg | ORAL_TABLET | Freq: Two times a day (BID) | ORAL | Status: DC
  Administered 2018-04-19 – 2018-04-21 (×5): 100 mg via ORAL
  Filled 2018-04-19 (×6): qty 1

## 2018-04-19 NOTE — Progress Notes (Addendum)
PROGRESS NOTE    [name]  [id number]:[id number]  DOB: 1950/08/31  DOA: 04/18/2018 PCP: Fayrene Helper, MD   Brief Admission Hx: 68 year old male with COPD, hypertension, hyperlipidemia who presented to the ED with shortness of breath and diagnosed with  an acute COPD exacerbation.  MDM/Assessment & Plan:   1. COPD exacerbation- patient is slowly starting to improve with treatments.  Continue IV steroids, antibiotic therapy, continue Advair, Incruse and albuterol HFA treatments. 2. Hyperlipidemia-he is treated with pravastatin 40 mg nightly which we will continue. 3. Essential hypertension-he is treated with Maxide which we have continued. 4. History of glucose intolerance-check hemoglobin A1c and monitor as we do expect some hyperglycemia associated with IV steroid use.  DVT prophylaxis: SCDs Code Status: Full Family Communication: updated son by telephone Disposition Plan: Continue inpatient treatments   Consultants:  n/a  Procedures:  N/a   Antimicrobials:  Doxycycline 4/12 >   Subjective: Patient reports that he is feeling better and less short of breath this morning.  Objective: Vitals:   04/18/18 2319 04/18/18 2322 04/18/18 2322 04/19/18 0503  BP:   (!) 157/98 134/79  Pulse: (!) 124  (!) 124 88  Resp:   (!) 22 18  Temp:   98.5 F (36.9 C) 97.8 F (36.6 C)  TempSrc:   Oral Oral  SpO2: 93%  95% 97%  Weight:  56.9 kg  58 kg  Height:  5\' 6"  (1.676 m)      Intake/Output Summary (Last 24 hours) at 04/19/2018 0926 Last data filed at 04/19/2018 2703 Gross per 24 hour  Intake 386.26 ml  Output 100 ml  Net 286.26 ml   Filed Weights   04/18/18 1933 04/18/18 2322 04/19/18 0503  Weight: 59.9 kg 56.9 kg 58 kg     REVIEW OF SYSTEMS  As per history otherwise all reviewed and reported negative  Exam:  General exam: Awake, alert, sitting in bed in no apparent distress, cooperative and pleasant. Respiratory system: Poor air movement bilateral. No  increased work of breathing. Cardiovascular system: S1 & S2 heard. No JVD, murmurs, gallops, clicks or pedal edema. Gastrointestinal system: Abdomen is nondistended, soft and nontender. Normal bowel sounds heard. Central nervous system: Alert and oriented. No focal neurological deficits. Extremities: no CCE.  Data Reviewed: Basic Metabolic Panel: Recent Labs  Lab 04/18/18 1942 04/19/18 0617  NA 142 141  K 3.8 4.1  CL 102 103  CO2 31 28  GLUCOSE 151* 160*  BUN 12 14  CREATININE 0.85 0.78  CALCIUM 9.1 9.2   Liver Function Tests: Recent Labs  Lab 04/18/18 1942 04/19/18 0617  AST 22 29  ALT 16 19  ALKPHOS 59 62  BILITOT 0.4 0.6  PROT 7.1 7.0  ALBUMIN 4.0 3.9   No results for input(s): LIPASE, AMYLASE in the last 168 hours. No results for input(s): AMMONIA in the last 168 hours. CBC: Recent Labs  Lab 04/18/18 1942 04/19/18 0617  WBC 8.9 6.9  NEUTROABS 3.9  --   HGB 13.4 13.1  HCT 43.0 41.3  MCV 97.3 94.9  PLT 214 212   Cardiac Enzymes: Recent Labs  Lab 04/18/18 1942 04/19/18 0617  TROPONINI <0.03 <0.03   CBG (last 3)  Recent Labs    04/19/18 0729  GLUCAP 145*   Recent Results (from the past 240 hour(s))  Blood culture (routine x 2)     Status: None (Preliminary result)   Collection Time: 04/18/18  8:22 PM  Result Value Ref Range Status  Specimen Description BLOOD RIGHT FOREARM  Final   Special Requests   Final    BOTTLES DRAWN AEROBIC AND ANAEROBIC Blood Culture adequate volume   Culture   Final    NO GROWTH < 12 HOURS Performed at [facility], [address], [place], [place] [address]    Report Status PENDING  Incomplete  Blood culture (routine x 2)     Status: None (Preliminary result)   Collection Time: 04/18/18  8:25 PM  Result Value Ref Range Status   Specimen Description BLOOD  Final   Special Requests NONE  Final   Culture   Final    NO GROWTH < 12 HOURS Performed at [facility], [address], [place], [place] [address]    Report  Status PENDING  Incomplete     Studies: Dg Chest Port 1 View  Result Date: 04/18/2018 CLINICAL DATA:  Shortness of breath. Wheezing. EXAM: PORTABLE CHEST 1 VIEW COMPARISON:  12/22/2017 FINDINGS: Chronic hyperinflation. Increased bronchial thickening above baseline. Biapical pleuroparenchymal scarring. No acute airspace disease, pulmonary edema, pleural effusion or pneumothorax. Normal heart size and mediastinal contours. IMPRESSION: Chronic hyperinflation. Increased bronchial thickening consistent with acute bronchitis. Electronically Signed   By: Keith Rake M.D.   On: 04/18/2018 21:20     Scheduled Meds: . albuterol  2 puff Inhalation Q6H  . aspirin EC  81 mg Oral Daily  . doxycycline  100 mg Oral Q12H  . enoxaparin (LOVENOX) injection  40 mg Subcutaneous Q24H  . fluticasone furoate-vilanterol  1 puff Inhalation Daily  . methylPREDNISolone (SOLU-MEDROL) injection  80 mg Intravenous Q8H  . pravastatin  40 mg Oral Daily  . sodium chloride flush  3 mL Intravenous Q12H  . triamterene-hydrochlorothiazide  1 tablet Oral Daily  . umeclidinium bromide  1 puff Inhalation Daily   Continuous Infusions: . sodium chloride 250 mL (04/19/18 0037)    Principal Problem:   COPD with acute exacerbation (HCC) Active Problems:   PSA, INCREASED   Tachycardia   COPD exacerbation (HCC)   Dyspnea   Glucose intolerance   Time spent:   Irwin Brakeman, MD [facility] 04/19/2018, 9:26 AM    LOS: 1 day  How to contact the [facility] Attending or Consulting provider [place] or covering provider during after hours [place], for this patient?  1. Check the care team in [facility] and look for a) attending/consulting TRH provider listed and b) the [facility] team listed 2. Log into [email or web address] and use [place]'s universal password to access. If you do not have the password, please contact the hospital operator. 3. Locate the [facility] provider you are looking for under [facility] and page to a number that  you can be directly reached. 4. If you still have difficulty reaching the provider, please page the [facility] (Director on Call) for the Hospitalists listed on amion for assistance.

## 2018-04-20 ENCOUNTER — Encounter (HOSPITAL_COMMUNITY): Payer: Self-pay | Admitting: Family Medicine

## 2018-04-20 DIAGNOSIS — R972 Elevated prostate specific antigen [PSA]: Secondary | ICD-10-CM

## 2018-04-20 LAB — BASIC METABOLIC PANEL
Anion gap: 8 (ref 5–15)
BUN: 19 mg/dL (ref 8–23)
CO2: 30 mmol/L (ref 22–32)
Calcium: 9.4 mg/dL (ref 8.9–10.3)
Chloride: 100 mmol/L (ref 98–111)
Creatinine, Ser: 0.8 mg/dL (ref 0.61–1.24)
GFR calc Af Amer: >60 mL/min (ref >60)
GFR calc non Af Amer: >60 mL/min (ref >60)
Glucose, Bld: 172 mg/dL — ABNORMAL HIGH (ref 70–99)
Potassium: 4 mmol/L (ref 3.5–5.1)
Sodium: 138 mmol/L (ref 135–145)

## 2018-04-20 LAB — GLUCOSE, CAPILLARY
Glucose-Capillary: 193 mg/dL — ABNORMAL HIGH (ref 70–99)
Glucose-Capillary: 147 mg/dL — ABNORMAL HIGH (ref 70–99)
Glucose-Capillary: 190 mg/dL — ABNORMAL HIGH (ref 70–99)
Glucose-Capillary: 161 mg/dL — ABNORMAL HIGH (ref 70–99)
Glucose-Capillary: 181 mg/dL — ABNORMAL HIGH (ref 70–99)

## 2018-04-20 LAB — HIV ANTIBODY (ROUTINE TESTING W REFLEX): HIV Screen 4th Generation wRfx: NONREACTIVE

## 2018-04-20 NOTE — Progress Notes (Signed)
Patient concerned he has "the virus" due to patient being under contact/droplet precautions.  Explained it was just a precaution and he doesn't have COVID-19.  Patient calmer after explaining this to him.

## 2018-04-20 NOTE — Care Management Important Message (Signed)
Important Message  Patient Details  Name: [name] [name]: [id number] Date of Birth: 1950-08-16   Medicare Important Message Given:  Other (see comment) IM given to nurse to place at bedside   [name] 04/20/2018, 3:48 PM

## 2018-04-20 NOTE — Evaluation (Signed)
Physical Therapy Evaluation Patient Details Name: [name] [name]: [id number] DOB: [birth year] Today's Date: 04/20/2018   History of Present Illness  [name]  is a 68 y.o. male,w Copd (not on home o2), hypertension,  hyperlipidemia, Glucose intolerance, psa elevation, apparently presents due to acute dyspnea.  Pt notes slight dry cough. Denies fever, chills, sore throat, cp, palp, n/v, diarrhea, brbpr, alteration in sense of smell or taste.  Pt denies any sick contacts, pt denies any known covid exposure, pt denies any recent travel outside the state.  He lives at home.      Clinical Impression  Patient functioning at baseline for functional mobility and gait.  Plan:  Patient discharged from physical therapy to care of nursing for ambulation daily as tolerated for length of stay.    Follow Up Recommendations No PT follow up    Equipment Recommendations  None recommended by PT    Recommendations for Other Services       Precautions / Restrictions Precautions Precautions: None Restrictions Weight Bearing Restrictions: No      Mobility  Bed Mobility Overal bed mobility: Independent                Transfers Overall transfer level: Independent                  Ambulation/Gait Ambulation/Gait assistance: Modified independent (Device/Increase time) Gait Distance (Feet): 200 Feet Assistive device: None Gait Pattern/deviations: WFL(Within Functional Limits) Gait velocity: slightly decreased once fatigued   General Gait Details: demonstrates good return for ambulation on level, inclined and declined surfaces without loss of balance  Stairs            Wheelchair Mobility    Modified Rankin (Stroke Patients Only)       Balance Overall balance assessment: No apparent balance deficits (not formally assessed)                                           Pertinent Vitals/Pain Pain Assessment: No/denies pain    Home Living  Family/patient expects to be discharged to:: Private residence Living Arrangements: Alone Available Help at Discharge: Other (Comment)(patient states he has no help) Type of Home: House Home Access: Stairs to enter Entrance Stairs-Rails: None Entrance Stairs-Number of Steps: 3 Home Layout: One level Home Equipment: None      Prior Function Level of Independence: Independent         Comments: community ambulator, drives     Journalist, newspaper        Extremity/Trunk Assessment   Upper Extremity Assessment Upper Extremity Assessment: Overall WFL for tasks assessed    Lower Extremity Assessment Lower Extremity Assessment: Overall WFL for tasks assessed    Cervical / Trunk Assessment Cervical / Trunk Assessment: Normal  Communication   Communication: No difficulties  Cognition Arousal/Alertness: Awake/alert Behavior During Therapy: WFL for tasks assessed/performed Overall Cognitive Status: Within Functional Limits for tasks assessed                                        General Comments      Exercises     Assessment/Plan    PT Assessment Patent does not need any further PT services  PT Problem List         PT Treatment Interventions  PT Goals (Current goals can be found in the Care Plan section)  Acute Rehab PT Goals Patient Stated Goal: return home PT Goal Formulation: With patient Time For Goal Achievement: 04/20/18 Potential to Achieve Goals: Good    Frequency     Barriers to discharge        Co-evaluation               AM-PAC PT "6 Clicks" Mobility  Outcome Measure Help needed turning from your back to your side while in a flat bed without using bedrails?: None Help needed moving from lying on your back to sitting on the side of a flat bed without using bedrails?: None Help needed moving to and from a bed to a chair (including a wheelchair)?: None Help needed standing up from a chair using your arms (e.g., wheelchair  or bedside chair)?: None Help needed to walk in hospital room?: None Help needed climbing 3-5 steps with a railing? : None 6 Click Score: 24    End of Session   Activity Tolerance: Patient tolerated treatment well(Patient limited by mild SOB, SpO2 desaturation) Patient left: in bed;with call bell/phone within reach(seated at bedside) Nurse Communication: Mobility status PT Visit Diagnosis: Unsteadiness on feet (R26.81);Other abnormalities of gait and mobility (R26.89);Muscle weakness (generalized) (M62.81)    Time: 0254-2706 PT Time Calculation (min) (ACUTE ONLY): 22 min   Charges:   PT Evaluation $PT Eval Moderate Complexity: 1 Mod PT Treatments $Gait Training: 8-22 mins        1:45 PM, 04/20/18 [name], MPT Physical Therapist with [facility] 336 [phone] office [phone] mobile phone

## 2018-04-20 NOTE — Progress Notes (Signed)
PROGRESS NOTE    [name]  VPX:[id number]  DOB: Mar 10, 1950  DOA: 04/18/2018 PCP: Fayrene Helper, MD   Brief Admission Hx: 68 year old male smoker with known COPD, hyperlipidemia, hypertension presented to the ED with shortness of breath and diagnosed with an acute COPD exacerbation.  MDM/Assessment & Plan:   1. Acute COPD exacerbation- continue current treatments as he is starting to show some improvement.  He is on IV steroids, antibiotics, Advair and other inhaler treatments.  We will continue.  If he continues to improve anticipating discharge home tomorrow.  Start ambulation with PT. 2. Hyperlipidemia-resumed home pravastatin 40 mg. 3. Essential hypertension-controlled with home medications. 4. History of glucose intolerance- his most recent A1c was 6.2% which indicates he likely has prediabetes.  We are monitoring his blood glucose in the setting of IV steroids.  Most readings between 140-180.  DVT prophylaxis: SCDs Code Status: Full Family Communication: Updated son telephone Disposition Plan: Continue inpatient treatments today with anticipation of discharge home 04/21/2018 if continues to improve   Consultants:    Procedures:    Antimicrobials:  Doxycycline 04/19/18 >  Subjective: Patient says he still short of breath but has had some improvement.  He is not ambulating in the room.  Objective: Vitals:   04/20/18 0055 04/20/18 0500 04/20/18 0637 04/20/18 0647  BP:    122/61  Pulse:    90  Resp:    18  Temp:    98 F (36.7 C)  TempSrc:    Oral  SpO2: 98%  98% 97%  Weight:  58 kg    Height:        Intake/Output Summary (Last 24 hours) at 04/20/2018 0837 Last data filed at 04/19/2018 2140 Gross per 24 hour  Intake 483 ml  Output 301 ml  Net 182 ml   Filed Weights   04/18/18 2322 04/19/18 0503 04/20/18 0500  Weight: 56.9 kg 58 kg 58 kg     REVIEW OF SYSTEMS  As per history otherwise all reviewed and reported negative  Exam:  General  exam: Thin male lying in the bed he is awake and alert he does not appear to be in distress.  He is cooperative. Respiratory system: Bilateral breath sounds tight with no expiratory wheezes heard and no crackles. No increased work of breathing. Cardiovascular system: S1 & S2 heard. No JVD, murmurs, gallops, clicks or pedal edema. Gastrointestinal system: Abdomen is nondistended, soft and nontender. Normal bowel sounds heard. Central nervous system: Alert and oriented. No focal neurological deficits. Extremities: no CCE.  Data Reviewed: Basic Metabolic Panel: Recent Labs  Lab 04/18/18 1942 04/19/18 0617 04/20/18 0600  NA 142 141 138  K 3.8 4.1 4.0  CL 102 103 100  CO2 31 28 30   GLUCOSE 151* 160* 172*  BUN 12 14 19   CREATININE 0.85 0.78 0.80  CALCIUM 9.1 9.2 9.4   Liver Function Tests: Recent Labs  Lab 04/18/18 1942 04/19/18 0617  AST 22 29  ALT 16 19  ALKPHOS 59 62  BILITOT 0.4 0.6  PROT 7.1 7.0  ALBUMIN 4.0 3.9   No results for input(s): LIPASE, AMYLASE in the last 168 hours. No results for input(s): AMMONIA in the last 168 hours. CBC: Recent Labs  Lab 04/18/18 1942 04/19/18 0617  WBC 8.9 6.9  NEUTROABS 3.9  --   HGB 13.4 13.1  HCT 43.0 41.3  MCV 97.3 94.9  PLT 214 212   Cardiac Enzymes: Recent Labs  Lab 04/18/18 1942 04/19/18 0617  TROPONINI <  0.03 <0.03   CBG (last 3)  Recent Labs    04/19/18 1611 04/19/18 2146 04/20/18 0736  GLUCAP 193* 157* 147*   Recent Results (from the past 240 hour(s))  Blood culture (routine x 2)     Status: None (Preliminary result)   Collection Time: 04/18/18  8:22 PM  Result Value Ref Range Status   Specimen Description BLOOD RIGHT FOREARM  Final   Special Requests   Final    BOTTLES DRAWN AEROBIC AND ANAEROBIC Blood Culture adequate volume   Culture   Final    NO GROWTH 2 DAYS Performed at [facility], [address], [place], [place] [address]    Report Status PENDING  Incomplete  Blood culture (routine x 2)      Status: None (Preliminary result)   Collection Time: 04/18/18  8:25 PM  Result Value Ref Range Status   Specimen Description BLOOD  Final   Special Requests NONE  Final   Culture   Final    NO GROWTH 2 DAYS Performed at [facility], [address], [place], [place] [address]    Report Status PENDING  Incomplete     Studies: Dg Chest Port 1 View  Result Date: 04/18/2018 CLINICAL DATA:  Shortness of breath. Wheezing. EXAM: PORTABLE CHEST 1 VIEW COMPARISON:  12/22/2017 FINDINGS: Chronic hyperinflation. Increased bronchial thickening above baseline. Biapical pleuroparenchymal scarring. No acute airspace disease, pulmonary edema, pleural effusion or pneumothorax. Normal heart size and mediastinal contours. IMPRESSION: Chronic hyperinflation. Increased bronchial thickening consistent with acute bronchitis. Electronically Signed   By: Keith Rake M.D.   On: 04/18/2018 21:20     Scheduled Meds: . albuterol  2 puff Inhalation Q6H  . aspirin EC  81 mg Oral Daily  . doxycycline  100 mg Oral Q12H  . enoxaparin (LOVENOX) injection  40 mg Subcutaneous Q24H  . fluticasone furoate-vilanterol  1 puff Inhalation Daily  . methylPREDNISolone (SOLU-MEDROL) injection  80 mg Intravenous Q8H  . pravastatin  40 mg Oral Daily  . sodium chloride flush  3 mL Intravenous Q12H  . triamterene-hydrochlorothiazide  1 tablet Oral Daily  . umeclidinium bromide  1 puff Inhalation Daily   Continuous Infusions: . sodium chloride 250 mL (04/19/18 0037)    Principal Problem:   COPD with acute exacerbation (HCC) Active Problems:   PSA, INCREASED   Tachycardia   COPD exacerbation (HCC)   Dyspnea   Glucose intolerance   Time spent:   Irwin Brakeman, MD [facility] 04/20/2018, 8:37 AM    LOS: 2 days  How to contact the [facility] Attending or Consulting provider [place] or covering provider during after hours [place], for this patient?  1. Check the care team in [facility] and look for a) attending/consulting  TRH provider listed and b) the [facility] team listed 2. Log into [email or web address] and use [place]'s universal password to access. If you do not have the password, please contact the hospital operator. 3. Locate the [facility] provider you are looking for under [facility] and page to a number that you can be directly reached. 4. If you still have difficulty reaching the provider, please page the [facility] (Director on Call) for the Hospitalists listed on amion for assistance.

## 2018-04-20 NOTE — Progress Notes (Signed)
   Primary Cardiologist:  No primary care provider on file.   Echocardiogram not indicated at this time and can be performed at a later date as an outpatient test once Covid 19 situation is clear.   Keymari Sato, MD  04/20/2018 10:04 AM         .  

## 2018-04-21 LAB — GLUCOSE, CAPILLARY
Glucose-Capillary: 153 mg/dL — ABNORMAL HIGH (ref 70–99)
Glucose-Capillary: 223 mg/dL — ABNORMAL HIGH (ref 70–99)

## 2018-04-21 MED ORDER — DOXYCYCLINE HYCLATE 100 MG PO TABS: 100.0000 mg | ORAL_TABLET | Freq: Two times a day (BID) | ORAL | 0 refills | Status: AC

## 2018-04-21 MED ORDER — OMEPRAZOLE 40 MG PO CPDR: 40.0000 mg | DELAYED_RELEASE_CAPSULE | Freq: Every day | ORAL | 0 refills | Status: DC

## 2018-04-21 MED ORDER — TRIAMTERENE-HCTZ 37.5-25 MG PO TABS: 1.0000 | ORAL_TABLET | Freq: Every day | ORAL | Status: DC

## 2018-04-21 MED ORDER — PREDNISONE 20 MG PO TABS: ORAL_TABLET | ORAL | 0 refills | Status: DC

## 2018-04-21 NOTE — Care Management (Signed)
Patient seen by PT, no needs. He has weaned to room air. No CM / TOC needs.

## 2018-04-21 NOTE — Discharge Instructions (Signed)
Acute Respiratory Distress Syndrome, Adult ° °Acute respiratory distress syndrome is a life-threatening condition in which fluid collects in the lungs. This prevents the lungs from filling with air and passing oxygen into the blood. This can cause the lungs and other vital organs to fail. The condition usually develops following an infection, illness, surgery, or injury. °What are the causes? °This condition may be caused by: °· An infection, such as sepsis or pneumonia. °· A serious injury to the head or chest. °· Severe bleeding from an injury. °· A major surgery. °· Breathing in harmful chemicals or smoke. °· Blood transfusions. °· A blood clot in the lungs. °· Breathing in vomit (aspiration). °· Near-drowning. °· Inflammation of the pancreas (pancreatitis). °· A drug overdose. °What are the signs or symptoms? °Sudden shortness of breath and rapid breathing are the main symptoms of this condition. Other symptoms may include: °· A fast or irregular heartbeat. °· Skin, lips, or fingernails that look blue (cyanosis). °· Confusion. °· Tiredness or loss of energy. °· Chest pain, particularly while taking a breath. °· Coughing. °· Restlessness or anxiety. °· Fever. This is usually present if there is an underlying infection, such as pneumonia. °How is this diagnosed? °This condition is diagnosed based on: °· Your symptoms. °· Medical history. °· A physical exam. During the exam, your health care provider will listen to your heart and check for crackling or wheezing sounds in your lungs. °You may also have other tests to confirm the diagnosis and measure how well your lungs are working. These may include: °· Measuring the amount of oxygen in your blood. Your health care provider will use two methods to do this procedure: °? A small device (pulse oximeter) that is placed on your finger, earlobe, or toe. °? An arterial blood gas test. A sample of blood is taken from an artery and tested for oxygen levels. °· Blood  tests. °· Chest X-rays or CT scans to look for fluid in the lungs. °· Taking a sample of your sputum to test for infection. °· Heart test, such as an echocardiogram or electrocardiogram. This is done to rule out any heart problems (such as heart failure) that may be causing your symptoms. °· Bronchoscopy. During this test, a thin, flexible tube with a light is passed into the mouth or nose, down the windpipe, and into the lungs. °How is this treated? °Treatment depends on the cause of your condition. The goal is to support you while your lungs heal and the underlying cause is treated. Treatment may include: °· Oxygen therapy. This may be done through: °? A tube in your nose or a face mask. °? A ventilator. This device helps move air into and out of your lungs through a breathing tube that is inserted into your mouth or nose. °· Continuous positive airway pressure (CPAP). This treatment uses mild air pressure to keep the airways open. A mask or other device will be placed over your nose or mouth. °· Tracheostomy. During this procedure, a small cut is made in your neck to create an opening to your windpipe. A breathing tube is placed directly into your windpipe. The breathing tube is connected to a ventilator. This is done if you have problems with your airway or if you need a ventilator for a long period of time. °· Positioning you to lie on your stomach (prone position). °· Medicines, such as: °? Sedatives to help you relax. °? Blood pressure medicines. °? Antibiotics to treat infection. °? Blood   thinners to prevent blood clots. ? Diuretics to help prevent excess fluid.  Fluids and nutrients given through an IV tube.  Wearing compression stockings on your legs to prevent blood clots.  Extra corporeal membrane oxygenation (ECMO). This treatment takes blood outside your body, adds oxygen, and removes carbon dioxide. The blood is then returned to your body. This treatment is only used in severe cases. Follow  these instructions at home:  Take over-the-counter and prescription medicines only as told by your health care provider.  Do not use any products that contain nicotine or tobacco, such as cigarettes and e-cigarettes. If you need help quitting, ask your health care provider.  Limit alcohol intake to no more than 1 drink per day for nonpregnant women and 2 drinks per day for men. One drink equals 12 oz of beer, 5 oz of wine, or 1 oz of hard liquor.  Ask friends and family to help you if daily activities make you tired.  Attend any pulmonary rehabilitation as told by your health care provider. This may include: ? Education about your condition. ? Exercises. ? Breathing training. ? Counseling. ? Learning techniques to conserve energy. ? Nutrition counseling.  Keep all follow-up visits as told by your health care provider. This is important. Contact a health care provider if:  You become short of breath during activity or while resting.  You develop a cough that does not go away.  You have a fever.  Your symptoms do not get better or they get worse.  You become anxious or depressed. Get help right away if:  You have sudden shortness of breath.  You develop sudden chest pain that does not go away.  You develop a rapid heart rate.  You develop swelling or pain in one of your legs.  You cough up blood.  You have trouble breathing.  Your skin, lips, or fingernails turn blue. These symptoms may represent a serious problem that is an emergency. Do not wait to see if the symptoms will go away. Get medical help right away. Call your local emergency services (911 in the [place] Do not drive yourself to the hospital. Summary  Acute respiratory distress syndrome is a life-threatening condition in which fluid collects in the lungs, which leads the lungs and other vital organs to fail.  This condition usually develops following an infection, illness, surgery, or injury.  Sudden  shortness of breath and rapid breathing are the main symptoms of acute respiratory distress syndrome.  Treatment may include oxygen therapy, continuous positive airway pressure (CPAP), tracheostomy, lying on your stomach (prone position), medicines, fluids and nutrients given through an IV tube, compression stockings, and extra corporeal membrane oxygenation (ECMO). This information is not intended to replace advice given to you by your health care provider. Make sure you discuss any questions you have with your health care provider. Document Released: 12/24/2004 Document Revised: 12/11/2015 Document Reviewed: 12/11/2015 Elsevier Interactive Patient Education  2019 [employer]   IMPORTANT INFORMATION: PAY CLOSE ATTENTION   PHYSICIAN DISCHARGE INSTRUCTIONS  Follow with Primary care provider  Fayrene Helper, MD  and other consultants as instructed your Hospitalist Physician  SEEK MEDICAL CARE OR RETURN TO EMERGENCY ROOM IF SYMPTOMS COME BACK, WORSEN OR NEW PROBLEM DEVELOPS.   Please note: You were cared for by a hospitalist during your hospital stay. Every effort will be made to forward records to your primary care provider.  You can request that your primary care provider send for your hospital records if they  have not received them.  Once you are discharged, your primary care physician will handle any further medical issues. Please note that NO REFILLS for any discharge medications will be authorized once you are discharged, as it is imperative that you return to your primary care physician (or establish a relationship with a primary care physician if you do not have one) for your post hospital discharge needs so that they can reassess your need for medications and monitor your lab values.  Please get a complete blood count and chemistry panel checked by your Primary MD at your next visit, and again as instructed by your Primary MD.  Get Medicines reviewed and adjusted: Please take all  your medications with you for your next visit with your Primary MD  Laboratory/radiological data: Please request your Primary MD to go over all hospital tests and procedure/radiological results at the follow up, please ask your primary care provider to get all Hospital records sent to his/her office.  In some cases, they will be blood work, cultures and biopsy results pending at the time of your discharge. Please request that your primary care provider follow up on these results.  If you are diabetic, please bring your blood sugar readings with you to your follow up appointment with primary care.    Please call and make your follow up appointments as soon as possible.    Also Note the following: If you experience worsening of your admission symptoms, develop shortness of breath, life threatening emergency, suicidal or homicidal thoughts you must seek medical attention immediately by calling 911 or calling your MD immediately  if symptoms less severe.  You must read complete instructions/literature along with all the possible adverse reactions/side effects for all the Medicines you take and that have been prescribed to you. Take any new Medicines after you have completely understood and accpet all the possible adverse reactions/side effects.   Do not drive when taking Pain medications or sleeping medications (Benzodiazepines)  Do not take more than prescribed Pain, Sleep and Anxiety Medications. It is not advisable to combine anxiety,sleep and pain medications without talking with your primary care practitioner  Special Instructions: If you have smoked or chewed Tobacco  in the last 2 yrs please stop smoking, stop any regular Alcohol  and or any Recreational drug use.  Wear Seat belts while driving.

## 2018-04-21 NOTE — Discharge Summary (Signed)
Physician Discharge Summary  [name] ZOX:[id number] DOB: 22-Nov-1950 DOA: 04/18/2018  PCP: Fayrene Helper, MD Pulmonologist: Luan Pulling  Admit date: 04/18/2018 Discharge date: 04/21/2018  Admitted From:  Home  Disposition: Home   Recommendations for Outpatient Follow-up:  1. Follow up with PCP in 1 weeks 2. Follow up with pulmonologist in 2 weeks  Discharge Condition: STABLE   CODE STATUS: FULL    Brief Hospitalization Summary: Please see all hospital notes, images, labs for full details of the hospitalization. Dr. Julianne Rice HPI:  [name]  is a 68 y.o. male,w Copd (not on home o2), hypertension,  hyperlipidemia, Glucose intolerance, psa elevation, apparently presents due to acute dyspnea.  Pt notes slight dry cough. Denies fever, chills, sore throat, cp, palp, n/v, diarrhea, brbpr, alteration in sense of smell or taste.  Pt denies any sick contacts, pt denies any known covid exposure, pt denies any recent travel outside the state.  He lives at home.    Pt states tried albuterol neb without benefit,  Denies noncompliance with Advair.  Pt states has had this Copd exacerbation in the past.   Pt called EMS , pox 72% on RA, and hr 165,  Pt given 1 duoneb and solumedrol 125mg  iv x1 en route and brought to ER for evaluation.    In ED,  T 97.9  P 118-143  R 22-31, Bp 130/70  Pox 93% on 2L [place] Wt 59.9  Na 142, K 3.8, Bun 12, Creatinine 0.85 Ast 22, Alt 16\ Glucose 151 Wbc 8.9, Hgb 13.4, Plt 214 Trop <0.03 D dimer  0.33    Pt given ativan 0.5mg  iv x1 in ER and placed on o2.   Pt will be admitted for dyspnea secondary to COPD exacerbation.   Brief Admission Hx: 68 year old male smoker with known COPD, hyperlipidemia, hypertension presented to the ED with shortness of breath and diagnosed with an acute COPD exacerbation.  MDM/Assessment & Plan:   1. Acute COPD exacerbation-Pt clinically improved and feeling better and wants to go home.  He was treated with IV steroids,  antibiotics, Advair and other inhaler treatments.  PT evaluated him and recommended no further follow up.   2. Hyperlipidemia-resumed home pravastatin 40 mg. 3. Essential hypertension-controlled with home medications. 4. History of glucose intolerance- his most recent A1c was 6.2% which indicates he likely has prediabetes.  We are monitoring his blood glucose in the setting of IV steroids.  Most readings between 140-180.  Follow up with PCP.   5. Elevated PSA - follow up with PCP for further surveillance and management.   DVT prophylaxis: SCDs Code Status: Full Family Communication: Updated son telephone Disposition Plan: Home    Consultants:    Procedures:    Antimicrobials:  Doxycycline 04/19/18 >  Discharge Diagnoses:  Principal Problem:   COPD with acute exacerbation ([place]) Active Problems:   PSA, INCREASED   Tachycardia   COPD exacerbation ([facility])   Dyspnea   Glucose intolerance   Discharge Instructions: Discharge Instructions    Call MD for:  difficulty breathing, headache or visual disturbances   Complete by:  As directed    Call MD for:  extreme fatigue   Complete by:  As directed    Call MD for:  persistant dizziness or light-headedness   Complete by:  As directed    Increase activity slowly   Complete by:  As directed      Allergies as of 04/21/2018   No Known Allergies     Medication List  TAKE these medications   albuterol (2.5 MG/3ML) 0.083% nebulizer solution Commonly known as:  PROVENTIL TAKE 1 AMPULE (3 MLS) BY NEBULIZATION AS DIRECTED EVERY 4 HOURS AS NEEDED.   albuterol 108 (90 Base) MCG/ACT inhaler Commonly known as:  PROVENTIL HFA;VENTOLIN HFA INHALE 2 PUFFS INTO THE LUNGS EVERY 4 HOURS AS NEEDED.   aspirin 81 MG tablet Take 81 mg by mouth daily.   Combivent Respimat 20-100 MCG/ACT Aers respimat Generic drug:  Ipratropium-Albuterol Inhale 1 puff into the lungs every 6 (six) hours.   doxycycline 100 MG tablet Commonly known  as:  VIBRA-TABS Take 1 tablet (100 mg total) by mouth every 12 (twelve) hours for 3 days.   Fluticasone-Salmeterol 500-50 MCG/DOSE Aepb Commonly known as:  Advair Diskus Inhale 1 puff into the lungs 2 (two) times daily. INHALE 1 PUFF BY MOUTH TWICE A DAY. RINSE MOUTH AFTER USE.   ipratropium 0.02 % nebulizer solution Commonly known as:  ATROVENT INHALE 1 VIAL VIA NEBULIZER EVERY 6-8 HOURS FOR WHEEZING.   omeprazole 40 MG capsule Commonly known as:  PRILOSEC Take 1 capsule (40 mg total) by mouth daily for 14 days.   pravastatin 40 MG tablet Commonly known as:  PRAVACHOL Take 1 tablet (40 mg total) by mouth daily.   predniSONE 20 MG tablet Commonly known as:  DELTASONE Take 3 PO QAM x3days, 2 PO QAM x3days, 1 PO QAM x3days Start taking on:  April 22, 2018   triamterene-hydrochlorothiazide 37.5-25 MG tablet Commonly known as:  MAXZIDE-25 Take 1 tablet by mouth daily. One and a half tablets once daily every morning for blood pressure      Follow-up Information    Fayrene Helper, MD. Schedule an appointment as soon as possible for a visit in 1 week(s).   Specialty:  Family Medicine Why:  Hospital follow-up Contact information: [address] [place] [address] [phone]        Sinda Du, MD. Schedule an appointment as soon as possible for a visit in 2 week(s).   Specialty:  Pulmonary Disease Why:  Hospital follow-up Contact information: [place] [address] [phone]          No Known Allergies Allergies as of 04/21/2018   No Known Allergies     Medication List    TAKE these medications   albuterol (2.5 MG/3ML) 0.083% nebulizer solution Commonly known as:  PROVENTIL TAKE 1 AMPULE (3 MLS) BY NEBULIZATION AS DIRECTED EVERY 4 HOURS AS NEEDED.   albuterol 108 (90 Base) MCG/ACT inhaler Commonly known as:  PROVENTIL HFA;VENTOLIN HFA INHALE 2 PUFFS INTO THE LUNGS EVERY 4 HOURS AS NEEDED.   aspirin 81 MG  tablet Take 81 mg by mouth daily.   Combivent Respimat 20-100 MCG/ACT Aers respimat Generic drug:  Ipratropium-Albuterol Inhale 1 puff into the lungs every 6 (six) hours.   doxycycline 100 MG tablet Commonly known as:  VIBRA-TABS Take 1 tablet (100 mg total) by mouth every 12 (twelve) hours for 3 days.   Fluticasone-Salmeterol 500-50 MCG/DOSE Aepb Commonly known as:  Advair Diskus Inhale 1 puff into the lungs 2 (two) times daily. INHALE 1 PUFF BY MOUTH TWICE A DAY. RINSE MOUTH AFTER USE.   ipratropium 0.02 % nebulizer solution Commonly known as:  ATROVENT INHALE 1 VIAL VIA NEBULIZER EVERY 6-8 HOURS FOR WHEEZING.   omeprazole 40 MG capsule Commonly known as:  PRILOSEC Take 1 capsule (40 mg total) by mouth daily for 14 days.   pravastatin 40 MG tablet Commonly  known as:  PRAVACHOL Take 1 tablet (40 mg total) by mouth daily.   predniSONE 20 MG tablet Commonly known as:  DELTASONE Take 3 PO QAM x3days, 2 PO QAM x3days, 1 PO QAM x3days Start taking on:  April 22, 2018   triamterene-hydrochlorothiazide 37.5-25 MG tablet Commonly known as:  MAXZIDE-25 Take 1 tablet by mouth daily. One and a half tablets once daily every morning for blood pressure       Procedures/Studies: Dg Chest Port 1 View  Result Date: 04/18/2018 CLINICAL DATA:  Shortness of breath. Wheezing. EXAM: PORTABLE CHEST 1 VIEW COMPARISON:  12/22/2017 FINDINGS: Chronic hyperinflation. Increased bronchial thickening above baseline. Biapical pleuroparenchymal scarring. No acute airspace disease, pulmonary edema, pleural effusion or pneumothorax. Normal heart size and mediastinal contours. IMPRESSION: Chronic hyperinflation. Increased bronchial thickening consistent with acute bronchitis. Electronically Signed   By: Keith Rake M.D.   On: 04/18/2018 21:20   US Aorta Medicare Screening  Result Date: 04/08/2018 CLINICAL DATA:  Male between 56-68 years of age with a smoking history. EXAM: US ABDOMINAL AORTA MEDICARE  SCREENING TECHNIQUE: Ultrasound examination of the abdominal aorta was performed as a screening evaluation for abdominal aortic aneurysm. COMPARISON:  None. FINDINGS: Abdominal aortic measurements as follows: Proximal:  2.1 cm Mid:  1.7 cm Distal:  1.6 cm Minor aortic atherosclerosis.  Negative for aneurysm. IMPRESSION: Abdominal aortic atherosclerosis without significant aneurysm. Electronically Signed   By: Jerilynn Mages.  Shick M.D.   On: 04/08/2018 11:33      Subjective: Pt says he is feeling a lot better, no SOB. He is ambulating.  No chest pain or SOB.  He wants to go home.   Discharge Exam: Vitals:   04/21/18 0835 04/21/18 0950  BP:    Pulse:    Resp:    Temp:    SpO2: 98% 93%   Vitals:   04/21/18 0700 04/21/18 0832 04/21/18 0835 04/21/18 0950  BP:      Pulse:      Resp:      Temp:      TempSrc:      SpO2: 98% 98% 98% 93%  Weight:      Height:        General: thin male, lying in bed, NAD. Cooperative.  Cardiovascular: normal S1/S2 +, no rubs, no gallops Respiratory: bilateral breath sounds no wheezing, no rhonchi Abdominal: Soft, NT, ND, bowel sounds + Extremities: no edema, no cyanosis   The results of significant diagnostics from this hospitalization (including imaging, microbiology, ancillary and laboratory) are listed below for reference.     Microbiology: Recent Results (from the past 240 hour(s))  Blood culture (routine x 2)     Status: None (Preliminary result)   Collection Time: 04/18/18  8:22 PM  Result Value Ref Range Status   Specimen Description BLOOD RIGHT FOREARM  Final   Special Requests   Final    BOTTLES DRAWN AEROBIC AND ANAEROBIC Blood Culture adequate volume   Culture   Final    NO GROWTH 3 DAYS Performed at [facility], [address] [place], [place] [address]    Report Status PENDING  Incomplete  Blood culture (routine x 2)     Status: None (Preliminary result)   Collection Time: 04/18/18  8:25 PM  Result Value Ref Range Status   Specimen  Description BLOOD LEFT ARM  Final   Special Requests   Final    BOTTLES DRAWN AEROBIC AND ANAEROBIC Blood Culture adequate volume   Culture   Final  NO GROWTH 3 DAYS Performed at [facility], [address], [place], [place] [address]    Report Status PENDING  Incomplete     Labs: BNP (last 3 results) Recent Labs    11/24/17 1049  BNP 77.4   Basic Metabolic Panel: Recent Labs  Lab 04/18/18 1942 04/19/18 0617 04/20/18 0600  NA 142 141 138  K 3.8 4.1 4.0  CL 102 103 100  CO2 31 28 30   GLUCOSE 151* 160* 172*  BUN 12 14 19   CREATININE 0.85 0.78 0.80  CALCIUM 9.1 9.2 9.4   Liver Function Tests: Recent Labs  Lab 04/18/18 1942 04/19/18 0617  AST 22 29  ALT 16 19  ALKPHOS 59 62  BILITOT 0.4 0.6  PROT 7.1 7.0  ALBUMIN 4.0 3.9   No results for input(s): LIPASE, AMYLASE in the last 168 hours. No results for input(s): AMMONIA in the last 168 hours. CBC: Recent Labs  Lab 04/18/18 1942 04/19/18 0617  WBC 8.9 6.9  NEUTROABS 3.9  --   HGB 13.4 13.1  HCT 43.0 41.3  MCV 97.3 94.9  PLT 214 212   Cardiac Enzymes: Recent Labs  Lab 04/18/18 1942 04/19/18 0617  TROPONINI <0.03 <0.03   BNP: Invalid input(s): POCBNP CBG: Recent Labs  Lab 04/20/18 0736 04/20/18 1113 04/20/18 1611 04/20/18 2311 04/21/18 0726  GLUCAP 147* 190* 161* 181* 153*   D-Dimer Recent Labs    04/18/18 2025  DDIMER 0.33   Hgb A1c No results for input(s): HGBA1C in the last 72 hours. Lipid Profile No results for input(s): CHOL, HDL, LDLCALC, TRIG, CHOLHDL, LDLDIRECT in the last 72 hours. Thyroid function studies Recent Labs    04/19/18 0623  TSH 0.396   Anemia work up No results for input(s): VITAMINB12, FOLATE, FERRITIN, TIBC, IRON, RETICCTPCT in the last 72 hours. Urinalysis    Component Value Date/Time   COLORURINE YELLOW 02/17/2011 2236   APPEARANCEUR CLEAR 02/17/2011 2236   LABSPEC >1.030 (H) 02/17/2011 2236   PHURINE 5.5 02/17/2011 2236   GLUCOSEU NEGATIVE  02/17/2011 2236   GLUCOSEU 100 (A) 07/30/2006 0507   HGBUR TRACE (A) 02/17/2011 2236   BILIRUBINUR NEGATIVE 02/17/2011 2236   KETONESUR NEGATIVE 02/17/2011 2236   PROTEINUR NEGATIVE 02/17/2011 2236   UROBILINOGEN 0.2 02/17/2011 2236   NITRITE NEGATIVE 02/17/2011 2236   LEUKOCYTESUR NEGATIVE 02/17/2011 2236   Sepsis Labs Invalid input(s): PROCALCITONIN,  WBC,  LACTICIDVEN Microbiology Recent Results (from the past 240 hour(s))  Blood culture (routine x 2)     Status: None (Preliminary result)   Collection Time: 04/18/18  8:22 PM  Result Value Ref Range Status   Specimen Description BLOOD RIGHT FOREARM  Final   Special Requests   Final    BOTTLES DRAWN AEROBIC AND ANAEROBIC Blood Culture adequate volume   Culture   Final    NO GROWTH 3 DAYS Performed at [facility], [address] [place], [place] [address]    Report Status PENDING  Incomplete  Blood culture (routine x 2)     Status: None (Preliminary result)   Collection Time: 04/18/18  8:25 PM  Result Value Ref Range Status   Specimen Description BLOOD LEFT ARM  Final   Special Requests   Final    BOTTLES DRAWN AEROBIC AND ANAEROBIC Blood Culture adequate volume   Culture   Final    NO GROWTH 3 DAYS Performed at [facility], [address], [place], [place] [address]    Report Status PENDING  Incomplete   Time  coordinating discharge:   SIGNED:  Irwin Brakeman, MD  [facility] 04/21/2018, 11:17 AM How to contact the [facility] Attending or Consulting provider [place] or covering provider during after hours [place], for this patient?  1. Check the care team in [facility] and look for a) attending/consulting [facility] provider listed and b) the [facility] team listed 2. Log into [email or web address] and use [place]'s universal password to access. If you do not have the password, please contact the hospital operator. 3. Locate the [facility] provider you are looking for under [facility] and page to a number that you can be directly  reached. 4. If you still have difficulty reaching the provider, please page the [facility] (Director on Call) for the Hospitalists listed on amion for assistance.

## 2018-04-22 ENCOUNTER — Telehealth: Payer: Self-pay

## 2018-04-22 ENCOUNTER — Telehealth: Payer: Self-pay | Admitting: *Deleted

## 2018-04-22 NOTE — Telephone Encounter (Signed)
Transition Care Management Follow-up Telephone Call   Date discharged?    04/21/2018            How have you been since you were released from the hospital? Feeling better since he left hospital   Do you understand why you were in the hospital?   Trouble breathing   Do you understand the discharge instructions? yes   Where were you discharged to? home   Items Reviewed:  Medications reviewed: yes- has all   Allergies reviewed: yes  Dietary changes reviewed: yes  Referrals reviewed: follow up PCP in 1 week and lung dr in 2 weeks    Functional Questionnaire:   Activities of Daily Living (ADLs):  lives alone but able to get around as before     Any transportation issues/concerns?:  walks or rides moped. No family help   Any patient concerns? not at this time   Confirmed importance and date/time of follow-up visits scheduled yes visit confirmed 4/20 at 8:20am     Confirmed with patient if condition begins to worsen call PCP or go to the ER.  Patient was given the office number and encouraged to call back with question or concerns.  : yes, pt understands

## 2018-04-22 NOTE — Telephone Encounter (Signed)
TOC complete. Scheduled 4/20 8:20am

## 2018-04-22 NOTE — Telephone Encounter (Signed)
Pt needs a TOC was discharged from the hospital yesterday 04-21-18

## 2018-04-23 LAB — CULTURE, BLOOD (ROUTINE X 2)
Culture: NO GROWTH
Culture: NO GROWTH
Special Requests: ADEQUATE
Special Requests: ADEQUATE

## 2018-04-24 ENCOUNTER — Other Ambulatory Visit: Payer: Self-pay | Admitting: *Deleted

## 2018-04-24 NOTE — Patient Outreach (Signed)
[facility]) Care Management  04/24/2018  DRAYTON TIEU 1950/05/31 [id number]   EMMI-general discharge,   RED ON EMMI ALERT Day # 1 Date: 04/23/18 Thursday 1024 Red Alert Reason: Got discharge papers? No Know who to call about changes in condition? No Transportation to follow-up? No Other questions/problems? Yes  Insurance: Government social research officer  [facility] admissions x  2 ED visits x 2 in the last 6 months    Outreach attempt # 1 successful at his home number Patient is able to verify HIPAA [facility] Care Management RN reviewed and addressed red alert with patient   EMMI:  Mr Hudlow reports he recalls the EMMI calls but receives multiple calls each day and is not sure of his responses provided for EMMI  [facility] RN CM reviewed the questions and his discharge instructions in details Mr Yono confirms he has his discharge instructions but has difficulty reading since his stroke and does not have support systems to assist  Cm discussed calling Dr Moshe Cipro about changes in his condition. He confirms he has Dr Griffin Dakin office number CM provided the 24 hour nurse call center number also  CM completed the transition of care assessment with Mr Kalla  CM discussed a low sodium heart healthy diet with modified carbohydrates CM suggested Mrs. Dash vs salt for seasoning during the discussion as Mr Goodley stated "The only way I can taste my food is with salt on it" He denies any other questions or medical concerns during this call  [facility] RN CM notes that Mr Cichy was also contacted by his primary MD office for Transition of care services on 04/22/18    Social: Mr Gleed confirms he lives alone and has no support system. He states he has a son but he does not visit. He reports issues with poor ability to read. He uses his moped for transportation. He reports he is aware of [facility] but has not used [facility].   Conditions: COPD exacerbation 04/18/18 to 04/21/18, HTN, asthma, hx of atypical PNA, IGT  (impaired glucose tolerance) DM type 2, hx of epidermal cyst of face, hx of substance abuse, vitamin D deficiency, HDL, tachycardia, anemia, social isolation,    DME: He does not have a glucometer and informs CM he has never been told "I have sugar" [facility] RN CM referred him back to his primary MD to ask on 04/27/18. CM encouraged hie to ask if he needs to be monitoring his cbgs He voiced understanding   Medications: He denies concerns with taking medications as prescribed, affording medications, side effects of medications and questions about medications He confirms he hs doxycycline, Prilosec and prednisone as CM reviewed the discharge instructions and the purposes of each medication    Appointments: He reports seeing his primary MD Dr [name] every 2-3 months 04/27/18 0820 He has a hospital f/u with Dr Moshe Cipro CM discussed telephone calls/telehealth assessments vs office visits related to the [place] 19 pandemic precautions He voiced understanding   Advance Directives: Denies need for assist with advance directives    Consent: [facility] RN CM reviewed [facility] services with patient. Patient gave verbal consent for services [facility] telephonic RN CM.   Advised patient that there will be further automated EMMI- post discharge calls to assess how the patient is doing following the recent hospitalization Advised the patient that another call may be received from a nurse if any of their responses were abnormal. Patient voiced understanding and was appreciative of f/u call.   Plan: .[facility] RN CM  will close case at this time as patient has been assessed and no needs identified/needs resolved.   Pt encouraged to return a call to [place] CM prn  [facility] RN CM sent a successful outreach letter as discussed with [facility] brochure enclosed for review    Sherae Santino L. Lavina Hamman, RN, BSN, Sac Coordinator Office number [phone] Mobile number [phone]  Main [facility] number  [phone] Fax number [phone]

## 2018-04-27 ENCOUNTER — Other Ambulatory Visit: Payer: Self-pay

## 2018-04-27 ENCOUNTER — Encounter: Payer: Self-pay | Admitting: Family Medicine

## 2018-04-27 ENCOUNTER — Ambulatory Visit (INDEPENDENT_AMBULATORY_CARE_PROVIDER_SITE_OTHER): Payer: Medicare HMO | Admitting: Family Medicine

## 2018-04-27 VITALS — BP 148/88 | HR 96 | Resp 14 | Ht 67.0 in | Wt 126.9 lb

## 2018-04-27 DIAGNOSIS — Z09 Encounter for follow-up examination after completed treatment for conditions other than malignant neoplasm: Secondary | ICD-10-CM | POA: Diagnosis not present

## 2018-04-27 DIAGNOSIS — E119 Type 2 diabetes mellitus without complications: Secondary | ICD-10-CM

## 2018-04-27 DIAGNOSIS — J449 Chronic obstructive pulmonary disease, unspecified: Secondary | ICD-10-CM

## 2018-04-27 DIAGNOSIS — E785 Hyperlipidemia, unspecified: Secondary | ICD-10-CM

## 2018-04-27 DIAGNOSIS — I10 Essential (primary) hypertension: Secondary | ICD-10-CM

## 2018-04-27 MED ORDER — IPRATROPIUM-ALBUTEROL 20-100 MCG/ACT IN AERS: INHALATION_SPRAY | RESPIRATORY_TRACT | 11 refills | Status: DC

## 2018-04-27 NOTE — Assessment & Plan Note (Addendum)
Patient in for follow up of recent hospitalization. Discharge summary, and laboratory and radiology data are reviewed, and any questions or concerns about recent hospitalization are discussed. Specific issues requiring follow up are specifically addressed. Medication review shows disconnect wit d/c summary sheet, so it is good that pt is actually in the office with his medication

## 2018-04-27 NOTE — Progress Notes (Signed)
   [name]     [name]: [id number]      DOB: 11/25/1950   HPI [name] is here for follow up and re-evaluation following hospitalization for exacerbation of COPD Feels much improved, unfortunately , he has not been taking doxycyclline as prescribed only once not twice daily. Denies fever , chills or discolored sputum. Breathing is comfortable, and again, meds for copd treatment , as in the inhalers, are not the same as those he has on his d/c summary  n the interim are  addressed. The PT denies any adverse reactions to current medications since the last visit.  There are no new concerns.  There are no specific complaints   ROS Denies recent fever or chills. Denies sinus pressure, nasal congestion, ear pain or sore throat. Denies chest congestion, productive cough or wheezing. Denies chest pains, palpitations and leg swelling Denies abdominal pain, nausea, vomiting,diarrhea or constipation.   Denies dysuria, frequency, hesitancy or incontinence. Denies joint pain, swelling and limitation in mobility. Denies headaches, seizures, numbness, or tingling. Denies depression, anxiety or insomnia. Denies skin break down or rash.   PE BP (!) 148/88   Pulse 96   Resp 14   Ht 5\' 7"  (1.702 m)   Wt 126 lb 14.4 oz (57.6 kg)   SpO2 95% Comment: room air  BMI 19.88 kg/m     Patient alert and oriented and in no cardiopulmonary distress.  HEENT: No facial asymmetry, EOMI,   oropharynx pink and moist.  Neck supple no JVD, no mass.  Chest: Clear to auscultation bilaterally.decreased though adequate air entry throughout  CVS: S1, S2 no murmurs, no S3.Regular rate.  ABD: Soft non tender.   Ext: No edema  MS: Adequate ROM spine, shoulders, hips and knees.  Skin: Intact, no ulcerations or rash noted.  Psych: Good eye contact, normal affect. Memory intact not anxious or depressed appearing.  CNS: CN 2-12 intact, power,  normal throughout.no focal deficits noted.   [facility] discharge follow-up Patient in for follow up of recent hospitalization. Discharge summary, and laboratory and radiology data are reviewed, and any questions or concerns about recent hospitalization are discussed. Specific issues requiring follow up are specifically addressed. Medication review shows disconnect wit d/c summary sheet, so it is good that pt is actually in the office with his medication   Asthma with COPD (chronic obstructive pulmonary disease) Marked improvement , medications reviewed and clarified that pt wil use advair and combivent daily as prescribed, combivent is prescribed

## 2018-04-27 NOTE — Patient Instructions (Addendum)
Change f/u to early June please    New lab order is for fasting lipid, cmp and EGFr and HBA1C, please get this 1 week before June    Take the medicine for infection ( capsule ) two times daily till done  You have 2 inhaler to use EVERY day , the Advair which you already have , and the combivent which you will collect ( based on d/c summary) Please discuss the Breo inhalers with [name] when you see him

## 2018-04-27 NOTE — Assessment & Plan Note (Signed)
Marked improvement , medications reviewed and clarified that pt wil use advair and combivent daily as prescribed, combivent is prescribed

## 2018-05-06 DIAGNOSIS — J441 Chronic obstructive pulmonary disease with (acute) exacerbation: Secondary | ICD-10-CM | POA: Diagnosis not present

## 2018-05-06 DIAGNOSIS — R739 Hyperglycemia, unspecified: Secondary | ICD-10-CM | POA: Diagnosis not present

## 2018-05-06 DIAGNOSIS — I10 Essential (primary) hypertension: Secondary | ICD-10-CM | POA: Diagnosis not present

## 2018-05-18 ENCOUNTER — Other Ambulatory Visit: Payer: Self-pay | Admitting: Family Medicine

## 2018-05-18 DIAGNOSIS — R69 Illness, unspecified: Secondary | ICD-10-CM | POA: Diagnosis not present

## 2018-05-20 ENCOUNTER — Ambulatory Visit: Payer: Self-pay | Admitting: Family Medicine

## 2018-05-25 ENCOUNTER — Encounter: Payer: Self-pay | Admitting: Family Medicine

## 2018-05-25 ENCOUNTER — Other Ambulatory Visit: Payer: Self-pay

## 2018-05-25 ENCOUNTER — Ambulatory Visit (INDEPENDENT_AMBULATORY_CARE_PROVIDER_SITE_OTHER): Payer: Medicare HMO | Admitting: Family Medicine

## 2018-05-25 VITALS — BP 122/80 | Ht 67.0 in | Wt 126.0 lb

## 2018-05-25 DIAGNOSIS — J449 Chronic obstructive pulmonary disease, unspecified: Secondary | ICD-10-CM

## 2018-05-25 DIAGNOSIS — I10 Essential (primary) hypertension: Secondary | ICD-10-CM

## 2018-05-25 MED ORDER — PREDNISONE 10 MG PO TABS: 10.0000 mg | ORAL_TABLET | Freq: Every day | ORAL | 0 refills | Status: DC

## 2018-05-25 MED ORDER — PREDNISONE 20 MG PO TABS: ORAL_TABLET | ORAL | 0 refills | Status: AC

## 2018-05-25 NOTE — Assessment & Plan Note (Signed)
Controlled, no change in medication  

## 2018-05-25 NOTE — Patient Instructions (Signed)
F/u as before , call if you need me sooner  Prednisone 20 mg tablet is prescribed to be filled today, after you finish the 20 mg dose , Prednisone 10 mg tablet is prescribed for once daily use for an additional 10 days  Please go to the ED if you have increased difficllty breathing

## 2018-05-25 NOTE — Progress Notes (Signed)
Virtual Visit via Telephone Note  I connected with [name] on 05/25/18 at  3:40 PM EDT by telephone and verified that I am speaking with the correct person using two identifiers.  Location: Patient: home Provider: office   I discussed the limitations, risks, security and privacy concerns of performing an evaluation and management service by telephone and the availability of in person appointments. I also discussed with the patient that there may be a patient responsible charge related to this service. The patient expressed understanding and agreed to proceed. This visit type is conducted due to national recommendations for restrictions regarding the COVID -19 Pandemic. Due to the patient's age and / or co morbidities, this format is felt to be most appropriate at this time without adequate follow up. The patient has no access to video technology/ had technical difficulties with video, requiring transitioning to audio format  only ( telephone ). All issues noted this document were discussed and addressed,no physical exam can be performed in this format.    History of Present Illness: 2 hr h/o increased shortness of breath and wheezing. No known sick contact , denies fever, chills or sputum production Denies sinus pressure or nasal drainage Denies recent fever or chills.  Denies chest pains, palpitations and leg swelling Denies abdominal pain, nausea, vomiting,diarrhea or constipation.   Denies dysuria, frequency, hesitancy or incontinence. Denies joint pain, swelling and limitation in mobility. Denies headaches, seizures, numbness, or tingling. Denies depression, anxiety or insomnia. Denies skin break down or rash.       Observations/Objective:  BP 122/80   Ht 5\' 7"  (1.702 m)   Wt 126 lb (57.2 kg)   BMI 19.73 kg/m  Good communication with no confusion and intact memory. Alert and oriented x 3 Mild respiratory distress during sppech   Assessment and Plan:  Asthma  with COPD (chronic obstructive pulmonary disease) H/o increased sOB and wheezing in past 2 hrs, no fever or colored sputum, burst of prednisone and daily prednisone prescribed  Essential hypertension Controlled, no change in medication    Follow Up Instructions:    I discussed the assessment and treatment plan with the patient. The patient was provided an opportunity to ask questions and all were answered. The patient agreed with the plan and demonstrated an understanding of the instructions.   The patient was advised to call back or seek an in-person evaluation if the symptoms worsen or if the condition fails to improve as anticipated.  I provided 21 minutes of non-face-to-face time during this encounter.   Ian Nakayama, MD

## 2018-05-25 NOTE — Assessment & Plan Note (Signed)
H/o increased sOB and wheezing in past 2 hrs, no fever or colored sputum, burst of prednisone and daily prednisone prescribed

## 2018-06-02 DIAGNOSIS — I10 Essential (primary) hypertension: Secondary | ICD-10-CM | POA: Diagnosis not present

## 2018-06-02 DIAGNOSIS — E119 Type 2 diabetes mellitus without complications: Secondary | ICD-10-CM | POA: Diagnosis not present

## 2018-06-02 DIAGNOSIS — E785 Hyperlipidemia, unspecified: Secondary | ICD-10-CM | POA: Diagnosis not present

## 2018-06-03 LAB — COMPLETE METABOLIC PANEL WITH GFR
AG Ratio: 1.9 (calc) (ref 1.0–2.5)
ALT: 12 U/L (ref 9–46)
AST: 13 U/L (ref 10–35)
Albumin: 4.1 g/dL (ref 3.6–5.1)
Alkaline phosphatase (APISO): 56 U/L (ref 35–144)
BUN: 14 mg/dL (ref 7–25)
CO2: 38 mmol/L — ABNORMAL HIGH (ref 20–32)
Calcium: 9.5 mg/dL (ref 8.6–10.3)
Chloride: 97 mmol/L — ABNORMAL LOW (ref 98–110)
Creat: 0.97 mg/dL (ref 0.70–1.18)
GFR, Est African American: 91 mL/min/{1.73_m2} (ref >=60)
GFR, Est Non African American: 79 mL/min/{1.73_m2} (ref >=60)
Globulin: 2.2 g/dL (calc) (ref 1.9–3.7)
Glucose, Bld: 148 mg/dL — ABNORMAL HIGH (ref 65–99)
Potassium: 3.5 mmol/L (ref 3.5–5.3)
Sodium: 140 mmol/L (ref 135–146)
Total Bilirubin: 0.6 mg/dL (ref 0.2–1.2)
Total Protein: 6.3 g/dL (ref 6.1–8.1)

## 2018-06-03 LAB — HEMOGLOBIN A1C
Hgb A1c MFr Bld: 6.4 % of total Hgb — ABNORMAL HIGH (ref <5.7)
Mean Plasma Glucose: 137 (calc)
eAG (mmol/L): 7.6 (calc)

## 2018-06-03 LAB — LIPID PANEL
Cholesterol: 194 mg/dL (ref <200)
HDL: 74 mg/dL (ref >=40)
LDL Cholesterol (Calc): 104 mg/dL (calc) — ABNORMAL HIGH
Non-HDL Cholesterol (Calc): 120 mg/dL (calc) (ref <130)
Total CHOL/HDL Ratio: 2.6 (calc) (ref <5.0)
Triglycerides: 74 mg/dL (ref <150)

## 2018-06-08 ENCOUNTER — Other Ambulatory Visit: Payer: Self-pay

## 2018-06-08 ENCOUNTER — Ambulatory Visit (INDEPENDENT_AMBULATORY_CARE_PROVIDER_SITE_OTHER): Payer: Medicare HMO | Admitting: Family Medicine

## 2018-06-08 ENCOUNTER — Encounter: Payer: Self-pay | Admitting: Family Medicine

## 2018-06-08 VITALS — BP 130/88 | HR 96 | Temp 98.2°F | Resp 15 | Ht 67.0 in | Wt 130.0 lb

## 2018-06-08 DIAGNOSIS — J449 Chronic obstructive pulmonary disease, unspecified: Secondary | ICD-10-CM

## 2018-06-08 DIAGNOSIS — E785 Hyperlipidemia, unspecified: Secondary | ICD-10-CM | POA: Diagnosis not present

## 2018-06-08 DIAGNOSIS — E7439 Other disorders of intestinal carbohydrate absorption: Secondary | ICD-10-CM

## 2018-06-08 DIAGNOSIS — I10 Essential (primary) hypertension: Secondary | ICD-10-CM

## 2018-06-08 NOTE — Patient Instructions (Signed)
Annual Physical exam with lab review Nov 14 or shortly after, call if you need me sooner  Very good labs and exam today  Social distancing. Frequent hand washing with soap and water Keeping your hands off of your face. These 3 practices will help to keep both you and your community healthy during this time. Please practice them faithfully!  Fasting lipid, cmp and EGFr and HBa1C 1 week before next visit  Thanks for choosing [facility], we consider it a privelige to serve you.

## 2018-06-12 ENCOUNTER — Other Ambulatory Visit: Payer: Self-pay | Admitting: Family Medicine

## 2018-06-14 ENCOUNTER — Encounter: Payer: Self-pay | Admitting: Family Medicine

## 2018-06-14 NOTE — Progress Notes (Signed)
[name]     [name]: [id number]      DOB: June 13, 1950   HPI [name] is here for follow up and re-evaluation of chronic medical conditions, medication management and review of any available recent lab and radiology data.  Preventive health is updated, specifically  Cancer screening and Immunization.   Questions or concerns regarding consultations or procedures which the PT has had in the interim are  addressed. The PT denies any adverse reactions to current medications since the last visit.  There are no new concerns.  There are no specific complaints   ROS Denies recent fever or chills. Denies sinus pressure, nasal congestion, ear pain or sore throat. Denies chest congestion, productive cough or wheezing. Denies chest pains, palpitations and leg swelling Denies abdominal pain, nausea, vomiting,diarrhea or constipation.   Denies dysuria, frequency, hesitancy or incontinence. Denies joint pain, swelling and limitation in mobility. Denies headaches, seizures, numbness, or tingling. Denies depression, anxiety or insomnia. Denies skin break down or rash.   PE  BP 130/88   Pulse 96   Temp 98.2 F (36.8 C) (Temporal)   Resp 15   Ht 5\' 7"  (1.702 m)   Wt 130 lb (59 kg)   SpO2 94%   BMI 20.36 kg/m   Patient alert and oriented and in no cardiopulmonary distress.  HEENT: No facial asymmetry, EOMI,   oropharynx pink and moist.  Neck supple no JVD, no mass.  Chest: Clear to auscultation bilaterally.Decreased though adequate air entry  CVS: S1, S2 no murmurs, no S3.Regular rate.  ABD: Soft non tender.   Ext: No edema  MS: Adequate ROM spine, shoulders, hips and knees.  Skin: Intact, no ulcerations or rash noted.  Psych: Good eye contact, normal affect. Memory intact not anxious or depressed appearing.  CNS: CN 2-12 intact, power,  normal throughout.no focal deficits noted.   Assessment & Plan  Essential hypertension Controlled, no change in medication DASH diet  and commitment to daily physical activity for a minimum of 30 minutes discussed and encouraged, as a part of hypertension management. The importance of attaining a healthy weight is also discussed.  BP/Weight 06/08/2018 05/25/2018 04/27/2018 04/21/2018 04/20/2018 03/24/2018 08/65/7846  Systolic BP 962 952 841 324 - 401 027  Diastolic BP 88 80 88 69 - 80 80  Wt. (Lbs) 130 126 126.9 - 127.87 132.08 136  BMI 20.36 19.73 19.88 - 20.64 20.69 21.3       Hyperlipidemia Hyperlipidemia:Low fat diet discussed and encouraged.   Lipid Panel  Lab Results  Component Value Date   CHOL 194 06/02/2018   HDL 74 06/02/2018   LDLCALC 104 (H) 06/02/2018   TRIG 74 06/02/2018   CHOLHDL 2.6 06/02/2018   No med change Needs to reduce fatty food intake    Glucose intolerance Patient educated about the importance of limiting  Carbohydrate intake , the need to commit to daily physical activity for a minimum of 30 minutes , and to commit weight loss. The fact that changes in all these areas will reduce or eliminate all together the development of diabetes is stressed.   Diabetic Labs Latest Ref Rng & Units 06/02/2018 04/20/2018 04/19/2018 04/18/2018 11/24/2017  HbA1c <5.7 % of total Hgb 6.4(H) - - - -  Chol <200 mg/dL 194 - - - -  HDL > OR = 40 mg/dL 74 - - - -  Calc LDL mg/dL (calc) 104(H) - - - -  Triglycerides <150 mg/dL 74 - - - -  Creatinine  0.70 - 1.18 mg/dL 0.97 0.80 0.78 0.85 0.97   BP/Weight 06/08/2018 05/25/2018 04/27/2018 04/21/2018 04/20/2018 03/24/2018 38/38/1840  Systolic BP 375 436 067 703 - 403 524  Diastolic BP 88 80 88 69 - 80 80  Wt. (Lbs) 130 126 126.9 - 127.87 132.08 136  BMI 20.36 19.73 19.88 - 20.64 20.69 21.3   No flowsheet data found.         Asthma with COPD (chronic obstructive pulmonary disease) Currently stable , no respiratory complaint/ distress , continue chronic meds

## 2018-06-14 NOTE — Assessment & Plan Note (Signed)
Patient educated about the importance of limiting  Carbohydrate intake , the need to commit to daily physical activity for a minimum of 30 minutes , and to commit weight loss. The fact that changes in all these areas will reduce or eliminate all together the development of diabetes is stressed.   Diabetic Labs Latest Ref Rng & Units 06/02/2018 04/20/2018 04/19/2018 04/18/2018 11/24/2017  HbA1c <5.7 % of total Hgb 6.4(H) - - - -  Chol <200 mg/dL 194 - - - -  HDL > OR = 40 mg/dL 74 - - - -  Calc LDL mg/dL (calc) 104(H) - - - -  Triglycerides <150 mg/dL 74 - - - -  Creatinine 0.70 - 1.18 mg/dL 0.97 0.80 0.78 0.85 0.97   BP/Weight 06/08/2018 05/25/2018 04/27/2018 04/21/2018 04/20/2018 03/24/2018 73/41/9379  Systolic BP 024 097 353 299 - 242 683  Diastolic BP 88 80 88 69 - 80 80  Wt. (Lbs) 130 126 126.9 - 127.87 132.08 136  BMI 20.36 19.73 19.88 - 20.64 20.69 21.3   No flowsheet data found.

## 2018-06-14 NOTE — Assessment & Plan Note (Signed)
Controlled, no change in medication DASH diet and commitment to daily physical activity for a minimum of 30 minutes discussed and encouraged, as a part of hypertension management. The importance of attaining a healthy weight is also discussed.  BP/Weight 06/08/2018 05/25/2018 04/27/2018 04/21/2018 04/20/2018 03/24/2018 33/82/5053  Systolic BP 976 734 193 790 - 240 973  Diastolic BP 88 80 88 69 - 80 80  Wt. (Lbs) 130 126 126.9 - 127.87 132.08 136  BMI 20.36 19.73 19.88 - 20.64 20.69 21.3

## 2018-06-14 NOTE — Assessment & Plan Note (Signed)
Currently stable , no respiratory complaint/ distress , continue chronic meds

## 2018-06-14 NOTE — Assessment & Plan Note (Signed)
Hyperlipidemia:Low fat diet discussed and encouraged.   Lipid Panel  Lab Results  Component Value Date   CHOL 194 06/02/2018   HDL 74 06/02/2018   LDLCALC 104 (H) 06/02/2018   TRIG 74 06/02/2018   CHOLHDL 2.6 06/02/2018   No med change Needs to reduce fatty food intake

## 2018-06-25 ENCOUNTER — Telehealth: Payer: Self-pay | Admitting: Family Medicine

## 2018-06-25 ENCOUNTER — Other Ambulatory Visit: Payer: Self-pay | Admitting: Family Medicine

## 2018-06-25 MED ORDER — PREDNISONE 5 MG (21) PO TBPK: 5.0000 mg | ORAL_TABLET | ORAL | 0 refills | Status: DC

## 2018-06-25 NOTE — Telephone Encounter (Signed)
Pt is calling stating he needs predisone to help him breathe--no other symptoms  Going on .

## 2018-06-25 NOTE — Telephone Encounter (Signed)
Is having a flare up of the SOB and is asking for prednsione. No fever or cough.

## 2018-06-25 NOTE — Telephone Encounter (Signed)
pred 10 mg dose pack is prescribed, I tried to contact him, no answer

## 2018-06-26 ENCOUNTER — Other Ambulatory Visit: Payer: Self-pay | Admitting: Family Medicine

## 2018-06-26 NOTE — Telephone Encounter (Signed)
Called patient and left a generic message that something had been called in for him.

## 2018-07-21 ENCOUNTER — Other Ambulatory Visit: Payer: Self-pay | Admitting: Family Medicine

## 2018-07-21 DIAGNOSIS — R69 Illness, unspecified: Secondary | ICD-10-CM | POA: Diagnosis not present

## 2018-07-23 NOTE — Telephone Encounter (Signed)
No additional notes to enter

## 2018-07-29 ENCOUNTER — Other Ambulatory Visit: Payer: Self-pay | Admitting: Family Medicine

## 2018-07-31 ENCOUNTER — Other Ambulatory Visit: Payer: Self-pay

## 2018-07-31 ENCOUNTER — Telehealth: Payer: Self-pay | Admitting: *Deleted

## 2018-07-31 ENCOUNTER — Other Ambulatory Visit: Payer: Self-pay | Admitting: Family Medicine

## 2018-07-31 MED ORDER — ALBUTEROL SULFATE HFA 108 (90 BASE) MCG/ACT IN AERS: 2.0000 | INHALATION_SPRAY | RESPIRATORY_TRACT | 3 refills | Status: DC | PRN

## 2018-07-31 NOTE — Telephone Encounter (Signed)
Refilled inhaler and sent to pharmacy

## 2018-07-31 NOTE — Telephone Encounter (Signed)
Pt called and said he needs his inhaler sent to [facility]

## 2018-08-17 ENCOUNTER — Encounter: Payer: Self-pay | Admitting: Family Medicine

## 2018-08-17 ENCOUNTER — Ambulatory Visit (INDEPENDENT_AMBULATORY_CARE_PROVIDER_SITE_OTHER): Payer: Medicare HMO | Admitting: Family Medicine

## 2018-08-17 ENCOUNTER — Encounter (INDEPENDENT_AMBULATORY_CARE_PROVIDER_SITE_OTHER): Payer: Self-pay

## 2018-08-17 ENCOUNTER — Other Ambulatory Visit: Payer: Self-pay

## 2018-08-17 VITALS — BP 137/84 | HR 101 | Temp 99.0°F | Resp 14 | Ht 67.0 in | Wt 129.0 lb

## 2018-08-17 DIAGNOSIS — I10 Essential (primary) hypertension: Secondary | ICD-10-CM | POA: Diagnosis not present

## 2018-08-17 DIAGNOSIS — J449 Chronic obstructive pulmonary disease, unspecified: Secondary | ICD-10-CM

## 2018-08-17 MED ORDER — PREDNISONE 5 MG (21) PO TBPK: 5.0000 mg | ORAL_TABLET | ORAL | 0 refills | Status: DC

## 2018-08-17 NOTE — Patient Instructions (Signed)
    Thank you for coming into the office today. I appreciate the opportunity to provide you with the care for your health and wellness. Today we discussed: cough with phlegm (COPD)  Follow Up: 11/19/2018 (already scheduled)  No labs today  Please take prednisone as directed.  If you have an increase in shortness of breath, trouble breathing, cough that is not better with inhaler please go to the nearest ED.  Please continue to practice social distancing to keep you, your family, and our community safe.  If you must go out, please wear a Mask and practice good handwashing.  [place] YOUR HANDS WELL AND FREQUENTLY. AVOID TOUCHING YOUR FACE, UNLESS YOUR HANDS ARE FRESHLY WASHED.  GET FRESH AIR DAILY. STAY HYDRATED WITH WATER.   It was a pleasure to see you and I look forward to continuing to work together on your health and well-being. Please do not hesitate to call the office if you need care or have questions about your care.  Have a wonderful day and week. With Gratitude, [name], DNP, AGNP-BC

## 2018-08-17 NOTE — Progress Notes (Signed)
Subjective:     Patient ID: [name], male   DOB: 09-16-1950, 68 y.o.   [name]: [id number]  [name] presents for Shortness of Breath (has copd)  COPD flare. Increased shortness of breath and cough not relieved by inhaler use. Reports daily use of rescue.  Reports taking medication as directed.  Coughing up green phlegm. Reports riding about on moped thinks allergy might be flared up. Had more increased work of breathing over weekend, got better today, but still having trouble. Low grade fever in office. Sats stable.  Denies sickeness or recent contact with anyone + with COVID.  Today patient denies some signs and symptoms of COVID 19 infection including fever, chills, and headache.   Past Medical, Surgical, Social History, Allergies, and Medications have been Reviewed.   Past Medical History:  Diagnosis Date  . Asthma   . COPD (chronic obstructive pulmonary disease) ([place])   . CVA (cerebral vascular accident) ([place]) 2008   with temporary vision loss   . Depression   . Elevated PSA 2014   no diagnosis pf prostate cancer in 07/2014  . History of substance abuse ([place]) 01/14/2011   Marijuana  . History of tobacco abuse 01/14/2011  . Hyperlipidemia   . Hypertension   . Marijuana abuse   . Nicotine addiction   . Prediabetes 2014   History reviewed. No pertinent surgical history. Social History   Socioeconomic History  . Marital status: Widowed    Spouse name: Not on file  . Number of children: 1  . Years of education: Not on file  . Highest education level: Not on file  Occupational History  . Occupation: unemployed   Social Needs  . Financial resource strain: Patient refused  . Food insecurity    Worry: Patient refused    Inability: Patient refused  . Transportation needs    Medical: Patient refused    Non-medical: Patient refused  Tobacco Use  . Smoking status: Former Smoker    Packs/day: 0.50    Years: 40.00    Pack years: 20.00    Types: Cigarettes     Quit date: 04/14/2015    Years since quitting: 3.3  . Smokeless tobacco: Never Used  Substance and Sexual Activity  . Alcohol use: Yes    Alcohol/week: 3.0 standard drinks    Types: 3 Cans of beer per week    Comment: occasionally  . Drug use: No    Comment: pt quit 20 years ago  . Sexual activity: Not Currently  Lifestyle  . Physical activity    Days per week: Patient refused    Minutes per session: Patient refused  . Stress: Patient refused  Relationships  . Social Herbalist on phone: Patient refused    Gets together: Patient refused    Attends religious service: Patient refused    Active member of club or organization: Patient refused    Attends meetings of clubs or organizations: Patient refused    Relationship status: Patient refused  . Intimate partner violence    Fear of current or ex partner: Patient refused    Emotionally abused: Patient refused    Physically abused: Patient refused    Forced sexual activity: Patient refused  Other Topics Concern  . Not on file  Social History Narrative  . Not on file    Outpatient Encounter Medications as of 08/17/2018  Medication Sig  . albuterol (PROVENTIL) (2.5 MG/3ML) 0.083% nebulizer solution TAKE 1 AMPULE (3 MLS)  BY NEBULIZATION AS DIRECTED EVERY 4 HOURS AS NEEDED.  [name] Kitchen albuterol (VENTOLIN HFA) 108 (90 Base) MCG/ACT inhaler Inhale 2 puffs into the lungs every 4 (four) hours as needed for wheezing or shortness of breath.  [name] Kitchen aspirin 81 MG tablet Take 81 mg by mouth daily.  . Fluticasone-Salmeterol (ADVAIR DISKUS) 500-50 MCG/DOSE AEPB Inhale 1 puff into the lungs 2 (two) times daily. INHALE 1 PUFF BY MOUTH TWICE A DAY. RINSE MOUTH AFTER USE.  [name] Kitchen ipratropium (ATROVENT) 0.02 % nebulizer solution INHALE 1 VIAL VIA NEBULIZER EVERY 6-8 HOURS FOR WHEEZING.  [name] Kitchen Ipratropium-Albuterol (COMBIVENT) 20-100 MCG/ACT AERS respimat One puff into lungs every 6 hours  . pravastatin (PRAVACHOL) 40 MG tablet TAKE (1) TABLET BY MOUTH AT  BEDTIME.  [name] Kitchen triamterene-hydrochlorothiazide (MAXZIDE-25) 37.5-25 MG tablet Take 1 tablet by mouth daily. One and a half tablets once daily every morning for blood pressure  . omeprazole (PRILOSEC) 40 MG capsule Take 1 capsule (40 mg total) by mouth daily for 14 days.  . predniSONE (STERAPRED UNI-PAK 21 TAB) 5 MG (21) TBPK tablet Take 1 tablet (5 mg total) by mouth as directed. Use as directed  . [DISCONTINUED] predniSONE (STERAPRED UNI-PAK 21 TAB) 5 MG (21) TBPK tablet Take 1 tablet (5 mg total) by mouth as directed. Use as directed (Patient not taking: Reported on 08/17/2018)  . [DISCONTINUED] simvastatin (ZOCOR) 40 MG tablet Take 40 mg by mouth daily.   No facility-administered encounter medications on file as of 08/17/2018.    No Known Allergies  Review of Systems  Constitutional: Negative for chills and fever.  HENT: Negative.   Eyes: Negative.   Respiratory: Positive for cough and shortness of breath.   Cardiovascular: Negative.   Gastrointestinal: Negative.   Endocrine: Negative.   Genitourinary: Negative.   Musculoskeletal: Negative.   Skin: Negative.   Allergic/Immunologic: Negative.   Neurological: Negative.   Hematological: Negative.   Psychiatric/Behavioral: Negative.   All other systems reviewed and are negative.      Objective:     BP 137/84   Pulse (!) 101   Temp 99 F (37.2 C) (Temporal)   Resp 14   Ht 5\' 7"  (1.702 m)   Wt 129 lb (58.5 kg)   SpO2 94%   BMI 20.20 kg/m   Physical Exam Vitals signs and nursing note reviewed.  Constitutional:      Appearance: Normal appearance. He is well-developed and well-groomed. He is obese.  HENT:     Head: Normocephalic and atraumatic.     Right Ear: External ear normal.     Left Ear: External ear normal.     Nose: Nose normal.     Mouth/Throat:     Mouth: Mucous membranes are moist.     Pharynx: Oropharynx is clear.  Eyes:     General:        Right eye: No discharge.        Left eye: No discharge.      Conjunctiva/sclera: Conjunctivae normal.  Neck:     Musculoskeletal: Normal range of motion and neck supple.  Cardiovascular:     Rate and Rhythm: Regular rhythm. Tachycardia present.     Pulses: Normal pulses.     Heart sounds: Normal heart sounds.  Pulmonary:     Effort: Pulmonary effort is normal.     Breath sounds: Examination of the right-upper field reveals decreased breath sounds. Examination of the left-upper field reveals decreased breath sounds. Examination of the right-middle field reveals decreased breath sounds. Examination of the  left-middle field reveals decreased breath sounds. Examination of the right-lower field reveals decreased breath sounds. Examination of the left-lower field reveals decreased breath sounds. Decreased breath sounds present. No rhonchi or rales.  Musculoskeletal: Normal range of motion.  Skin:    General: Skin is warm.  Neurological:     General: No focal deficit present.     Mental Status: He is alert and oriented to person, place, and time.  Psychiatric:        Attention and Perception: Attention normal.        Mood and Affect: Mood normal.        Speech: Speech normal.        Behavior: Behavior normal. Behavior is cooperative.        Thought Content: Thought content normal.        Cognition and Memory: Cognition normal.        Judgment: Judgment normal.        Assessment and Plan       1. Asthma with COPD (chronic obstructive pulmonary disease) ([place]) On going COPD S&S without relief of rescue inhalers. Given low dose pred pk.  Educated on COVID symptoms and when to go to ED if needed  - predniSONE (STERAPRED UNI-PAK 21 TAB) 5 MG (21) TBPK tablet; Take 1 tablet (5 mg total) by mouth as directed. Use as directed  Dispense: 21 tablet; Refill: 0  2. Essential hypertension Controlled, continue current medication regimen.  No refills needed.   Follow Up: as needed   Perlie Mayo, DNP, AGNP-BC [address], [place], [place] [address] Office Hours: Mon-Thurs 8 am-5 pm; Fri 8 am-12 pm Office Phone:  [phone]  Office Fax: [phone]

## 2018-08-27 ENCOUNTER — Other Ambulatory Visit: Payer: Self-pay | Admitting: Family Medicine

## 2018-08-28 ENCOUNTER — Other Ambulatory Visit: Payer: Self-pay | Admitting: Family Medicine

## 2018-08-28 ENCOUNTER — Telehealth: Payer: Self-pay | Admitting: *Deleted

## 2018-08-28 MED ORDER — BENZONATATE 100 MG PO CAPS: 100.0000 mg | ORAL_CAPSULE | Freq: Two times a day (BID) | ORAL | 1 refills | Status: DC | PRN

## 2018-08-28 NOTE — Telephone Encounter (Signed)
Pt called said he is really congested and his sides hurt wanted to know if Dr. Moshe Cipro would call him in something to get rid of the congestion

## 2018-08-28 NOTE — Telephone Encounter (Signed)
Made patient aware that medicine was called in for him with verbal understanding.

## 2018-08-28 NOTE — Telephone Encounter (Signed)
Any fever , chills, sputum , and what color, any wheezing. Pls let me know also duration of symptoms

## 2018-08-28 NOTE — Telephone Encounter (Signed)
Tessalon perles are prescribed , pls let him know

## 2018-08-28 NOTE — Telephone Encounter (Signed)
Patient has NO fever or chills. When he is able to get any sputum up it is kind of a green color. NO wheezing. Symptoms started last night.

## 2018-08-28 NOTE — Progress Notes (Signed)
tessal

## 2018-08-31 ENCOUNTER — Other Ambulatory Visit: Payer: Self-pay

## 2018-08-31 ENCOUNTER — Ambulatory Visit (INDEPENDENT_AMBULATORY_CARE_PROVIDER_SITE_OTHER): Payer: Medicare HMO

## 2018-08-31 DIAGNOSIS — Z23 Encounter for immunization: Secondary | ICD-10-CM | POA: Diagnosis not present

## 2018-09-08 ENCOUNTER — Other Ambulatory Visit: Payer: Self-pay | Admitting: Family Medicine

## 2018-09-08 ENCOUNTER — Encounter: Payer: Self-pay | Admitting: Family Medicine

## 2018-09-08 ENCOUNTER — Other Ambulatory Visit: Payer: Self-pay

## 2018-09-08 ENCOUNTER — Ambulatory Visit (INDEPENDENT_AMBULATORY_CARE_PROVIDER_SITE_OTHER): Payer: Medicare Other | Admitting: Family Medicine

## 2018-09-08 VITALS — BP 136/87 | HR 97 | Temp 98.6°F | Resp 20 | Ht 67.0 in | Wt 129.0 lb

## 2018-09-08 DIAGNOSIS — J4489 Other specified chronic obstructive pulmonary disease: Secondary | ICD-10-CM

## 2018-09-08 DIAGNOSIS — J449 Chronic obstructive pulmonary disease, unspecified: Secondary | ICD-10-CM | POA: Diagnosis not present

## 2018-09-08 DIAGNOSIS — I10 Essential (primary) hypertension: Secondary | ICD-10-CM

## 2018-09-08 MED ORDER — ALBUTEROL SULFATE HFA 108 (90 BASE) MCG/ACT IN AERS: 2.0000 | INHALATION_SPRAY | RESPIRATORY_TRACT | 3 refills | Status: DC | PRN

## 2018-09-08 MED ORDER — FLUTICASONE-SALMETEROL 500-50 MCG/DOSE IN AEPB: 1.0000 | INHALATION_SPRAY | Freq: Two times a day (BID) | RESPIRATORY_TRACT | 3 refills | Status: DC

## 2018-09-08 NOTE — Progress Notes (Unsigned)
Subjective:     Patient ID: [name], male   DOB: 11-Dec-1950, 68 y.o.   [name]: [id number]  [name] presents for No chief complaint on file.    Today patient denies signs and symptoms of COVID 19 infection including fever, chills, cough, shortness of breath, and headache.  Past Medical, Surgical, Social History, Allergies, and Medications have been Reviewed.   Past Medical History:  Diagnosis Date  . Asthma   . COPD (chronic obstructive pulmonary disease) ([employer])   . CVA (cerebral vascular accident) ([place]) 2008   with temporary vision loss   . Depression   . Elevated PSA 2014   no diagnosis pf prostate cancer in 07/2014  . History of substance abuse ([place]) 01/14/2011   Marijuana  . History of tobacco abuse 01/14/2011  . Hyperlipidemia   . Hypertension   . Marijuana abuse   . Nicotine addiction   . Prediabetes 2014   No past surgical history on file. Social History   Socioeconomic History  . Marital status: Widowed    Spouse name: Not on file  . Number of children: 1  . Years of education: Not on file  . Highest education level: Not on file  Occupational History  . Occupation: unemployed   Social Needs  . Financial resource strain: Patient refused  . Food insecurity    Worry: Patient refused    Inability: Patient refused  . Transportation needs    Medical: Patient refused    Non-medical: Patient refused  Tobacco Use  . Smoking status: Former Smoker    Packs/day: 0.50    Years: 40.00    Pack years: 20.00    Types: Cigarettes    Quit date: 04/14/2015    Years since quitting: 3.4  . Smokeless tobacco: Never Used  Substance and Sexual Activity  . Alcohol use: Yes    Alcohol/week: 3.0 standard drinks    Types: 3 Cans of beer per week    Comment: occasionally  . Drug use: No    Comment: pt quit 20 years ago  . Sexual activity: Not Currently  Lifestyle  . Physical activity    Days per week: Patient refused    Minutes per session: Patient refused  .  Stress: Patient refused  Relationships  . Social Herbalist on phone: Patient refused    Gets together: Patient refused    Attends religious service: Patient refused    Active member of club or organization: Patient refused    Attends meetings of clubs or organizations: Patient refused    Relationship status: Patient refused  . Intimate partner violence    Fear of current or ex partner: Patient refused    Emotionally abused: Patient refused    Physically abused: Patient refused    Forced sexual activity: Patient refused  Other Topics Concern  . Not on file  Social History Narrative  . Not on file    Outpatient Encounter Medications as of 09/08/2018  Medication Sig  . albuterol (PROVENTIL) (2.5 MG/3ML) 0.083% nebulizer solution TAKE 1 AMPULE (3 MLS) BY NEBULIZATION AS DIRECTED EVERY 4 HOURS AS NEEDED.  [name] Kitchen albuterol (VENTOLIN HFA) 108 (90 Base) MCG/ACT inhaler Inhale 2 puffs into the lungs every 4 (four) hours as needed for wheezing or shortness of breath.  [name] Kitchen aspirin 81 MG tablet Take 81 mg by mouth daily.  . benzonatate (TESSALON) 100 MG capsule Take 1 capsule (100 mg total) by mouth 2 (two) times daily as needed  for cough.  . Fluticasone-Salmeterol (ADVAIR DISKUS) 500-50 MCG/DOSE AEPB Inhale 1 puff into the lungs 2 (two) times daily. INHALE 1 PUFF BY MOUTH TWICE A DAY. RINSE MOUTH AFTER USE.  [name] Kitchen ipratropium (ATROVENT) 0.02 % nebulizer solution INHALE 1 VIAL VIA NEBULIZER EVERY 6-8 HOURS FOR WHEEZING.  [name] Kitchen Ipratropium-Albuterol (COMBIVENT) 20-100 MCG/ACT AERS respimat One puff into lungs every 6 hours  . omeprazole (PRILOSEC) 40 MG capsule Take 1 capsule (40 mg total) by mouth daily for 14 days.  . pravastatin (PRAVACHOL) 40 MG tablet TAKE (1) TABLET BY MOUTH AT BEDTIME.  . predniSONE (STERAPRED UNI-PAK 21 TAB) 5 MG (21) TBPK tablet Take 1 tablet (5 mg total) by mouth as directed. Use as directed  . triamterene-hydrochlorothiazide (MAXZIDE-25) 37.5-25 MG tablet TAKE 1 & 1/2 TABLETS  BY MOUTH ONCE DAILY.  . [DISCONTINUED] albuterol (VENTOLIN HFA) 108 (90 Base) MCG/ACT inhaler Inhale 2 puffs into the lungs every 4 (four) hours as needed for wheezing or shortness of breath.  . [DISCONTINUED] Fluticasone-Salmeterol (ADVAIR DISKUS) 500-50 MCG/DOSE AEPB Inhale 1 puff into the lungs 2 (two) times daily. INHALE 1 PUFF BY MOUTH TWICE A DAY. RINSE MOUTH AFTER USE.  . [DISCONTINUED] simvastatin (ZOCOR) 40 MG tablet Take 40 mg by mouth daily.   No facility-administered encounter medications on file as of 09/08/2018.    No Known Allergies  Review of Systems     Objective:     There were no vitals taken for this visit.  Physical Exam     Assessment and Plan        1. Asthma with COPD (chronic obstructive pulmonary disease) ([facility])   - albuterol (VENTOLIN HFA) 108 (90 Base) MCG/ACT inhaler; Inhale 2 puffs into the lungs every 4 (four) hours as needed for wheezing or shortness of breath.  Dispense: 18 g; Refill: 3 - Fluticasone-Salmeterol (ADVAIR DISKUS) 500-50 MCG/DOSE AEPB; Inhale 1 puff into the lungs 2 (two) times daily. INHALE 1 PUFF BY MOUTH TWICE A DAY. RINSE MOUTH AFTER USE.  Dispense: 1 each; Refill: 3    No follow-ups on file.

## 2018-09-08 NOTE — Progress Notes (Signed)
Subjective:     Patient ID: [name], male   DOB: May 08, 1950, 68 y.o.   [name]: [id number]  [name] presents for Shortness of Breath  [name] is a 68 year old male patient who has an extensive history of asthma, COPD, CVA with temporal vision loss, substance abuse with tobacco marijuana, hyperlipidemia, hypertension among others.  Reports that he has been out of his inhalers for the last 2 days.  Still taking the prednisone that he was prescribed by me back on [name].  He was having a COPD flare.  Had some green phlegm at that time.  Overall he reported that he was starting to feel better by the time that he had came into the office.  There was a low-grade fever noted then.  There is no fever today.  He denied any positive contacts with anybody with COVID.  He reports that it is probably because he has not been able to use his inhalers in 2 days.  Oxygen upon arriving into the office was 92%.  He was placed on 1.5 L and rechecked at 96%.  Respirations had decreased from 24-12 at that time.  He reported that he started feeling better once he had a little bit of oxygen on.  Upon taking the oxygen off his O2 stayed at 95-96%.  Today patient denies signs and symptoms of COVID 19 infection including fever, chills, cough, shortness of breath, and headache.  Past Medical, Surgical, Social History, Allergies, and Medications have been Reviewed.   Past Medical History:  Diagnosis Date  . Asthma   . COPD (chronic obstructive pulmonary disease) ([place])   . CVA (cerebral vascular accident) ([place]) 2008   with temporary vision loss   . Depression   . Elevated PSA 2014   no diagnosis pf prostate cancer in 07/2014  . History of substance abuse ([place]) 01/14/2011   Marijuana  . History of tobacco abuse 01/14/2011  . Hyperlipidemia   . Hypertension   . Marijuana abuse   . Nicotine addiction   . Prediabetes 2014   History reviewed. No pertinent surgical history. Social History    Socioeconomic History  . Marital status: Widowed    Spouse name: Not on file  . Number of children: 1  . Years of education: Not on file  . Highest education level: Not on file  Occupational History  . Occupation: unemployed   Social Needs  . Financial resource strain: Patient refused  . Food insecurity    Worry: Patient refused    Inability: Patient refused  . Transportation needs    Medical: Patient refused    Non-medical: Patient refused  Tobacco Use  . Smoking status: Former Smoker    Packs/day: 0.50    Years: 40.00    Pack years: 20.00    Types: Cigarettes    Quit date: 04/14/2015    Years since quitting: 3.4  . Smokeless tobacco: Never Used  Substance and Sexual Activity  . Alcohol use: Yes    Alcohol/week: 3.0 standard drinks    Types: 3 Cans of beer per week    Comment: occasionally  . Drug use: No    Comment: pt quit 20 years ago  . Sexual activity: Not Currently  Lifestyle  . Physical activity    Days per week: Patient refused    Minutes per session: Patient refused  . Stress: Patient refused  Relationships  . Social connections    Talks on phone: Patient refused  Gets together: Patient refused    Attends religious service: Patient refused    Active member of club or organization: Patient refused    Attends meetings of clubs or organizations: Patient refused    Relationship status: Patient refused  . Intimate partner violence    Fear of current or ex partner: Patient refused    Emotionally abused: Patient refused    Physically abused: Patient refused    Forced sexual activity: Patient refused  Other Topics Concern  . Not on file  Social History Narrative  . Not on file    Outpatient Encounter Medications as of 09/08/2018  Medication Sig  . albuterol (PROVENTIL) (2.5 MG/3ML) 0.083% nebulizer solution TAKE 1 AMPULE (3 MLS) BY NEBULIZATION AS DIRECTED EVERY 4 HOURS AS NEEDED.  [name] Kitchen aspirin 81 MG tablet Take 81 mg by mouth daily.  . benzonatate  (TESSALON) 100 MG capsule Take 1 capsule (100 mg total) by mouth 2 (two) times daily as needed for cough.  [name] Kitchen ipratropium (ATROVENT) 0.02 % nebulizer solution INHALE 1 VIAL VIA NEBULIZER EVERY 6-8 HOURS FOR WHEEZING.  [name] Kitchen Ipratropium-Albuterol (COMBIVENT) 20-100 MCG/ACT AERS respimat One puff into lungs every 6 hours  . omeprazole (PRILOSEC) 40 MG capsule Take 1 capsule (40 mg total) by mouth daily for 14 days.  . pravastatin (PRAVACHOL) 40 MG tablet TAKE (1) TABLET BY MOUTH AT BEDTIME.  . predniSONE (STERAPRED UNI-PAK 21 TAB) 5 MG (21) TBPK tablet Take 1 tablet (5 mg total) by mouth as directed. Use as directed  . triamterene-hydrochlorothiazide (MAXZIDE-25) 37.5-25 MG tablet TAKE 1 & 1/2 TABLETS BY MOUTH ONCE DAILY.  . [DISCONTINUED] albuterol (VENTOLIN HFA) 108 (90 Base) MCG/ACT inhaler Inhale 2 puffs into the lungs every 4 (four) hours as needed for wheezing or shortness of breath.  . [DISCONTINUED] Fluticasone-Salmeterol (ADVAIR DISKUS) 500-50 MCG/DOSE AEPB Inhale 1 puff into the lungs 2 (two) times daily. INHALE 1 PUFF BY MOUTH TWICE A DAY. RINSE MOUTH AFTER USE.  . [DISCONTINUED] simvastatin (ZOCOR) 40 MG tablet Take 40 mg by mouth daily.   No facility-administered encounter medications on file as of 09/08/2018.    No Known Allergies  Review of Systems  Constitutional: Negative for chills and fever.  HENT: Negative.   Eyes: Negative.   Respiratory: Positive for shortness of breath. Negative for cough.   Cardiovascular: Negative.   Gastrointestinal: Negative.   Endocrine: Negative.   Genitourinary: Negative.   Musculoskeletal: Negative.   Skin: Negative.   Allergic/Immunologic: Negative.   Neurological: Negative.   Hematological: Negative.   Psychiatric/Behavioral: Negative.   All other systems reviewed and are negative.      Objective:     BP 136/87   Pulse 97   Temp 98.6 F (37 C)   Resp 20   Ht 5\' 7"  (1.702 m)   Wt 129 lb (58.5 kg)   SpO2 96% Comment: 1.5 l O2  BMI  20.20 kg/m   Physical Exam Vitals signs and nursing note reviewed.  Constitutional:      Appearance: Normal appearance. He is well-developed and well-groomed.  HENT:     Head: Normocephalic and atraumatic.     Right Ear: External ear normal.     Left Ear: External ear normal.     Nose: Nose normal.     Mouth/Throat:     Mouth: Mucous membranes are moist.     Pharynx: Oropharynx is clear.  Eyes:     General:        Right eye: No discharge.  Left eye: No discharge.     Conjunctiva/sclera: Conjunctivae normal.  Neck:     Musculoskeletal: Normal range of motion and neck supple.  Cardiovascular:     Rate and Rhythm: Normal rate and regular rhythm.     Pulses: Normal pulses.     Heart sounds: Normal heart sounds.  Pulmonary:     Effort: Pulmonary effort is normal. Tachypnea present. No accessory muscle usage or respiratory distress.     Breath sounds: No stridor. Examination of the right-upper field reveals decreased breath sounds. Examination of the left-upper field reveals decreased breath sounds. Examination of the right-middle field reveals decreased breath sounds. Examination of the left-middle field reveals decreased breath sounds. Examination of the right-lower field reveals decreased breath sounds. Examination of the left-lower field reveals rhonchi. Decreased breath sounds and rhonchi present. No wheezing.  Musculoskeletal: Normal range of motion.  Skin:    General: Skin is warm.  Neurological:     General: No focal deficit present.     Mental Status: He is alert and oriented to person, place, and time.  Psychiatric:        Attention and Perception: Attention normal.        Mood and Affect: Mood normal.        Speech: Speech normal.        Behavior: Behavior normal. Behavior is cooperative.        Thought Content: Thought content normal.        Cognition and Memory: Cognition normal.        Judgment: Judgment normal.        Assessment and Plan        1.  Asthma with COPD (chronic obstructive pulmonary disease) ([place]) Advised to continue his prednisone taper.  Encouraged to get refills of his inhalers.  He was kept today in the office on low oxygen 1.5 L for [name] to 15 minutes while prescriptions were sent so that hopefully they would be filled by the time that he arrived at the pharmacy.  Education was done on ED instructions if the inhalers continue to not be of benefit.  Noted today he did have some more rhonchi in the left lower field of his lung that he did back on [name] Ian.  Though he does not have any other signs of symptoms of mucus production or cough like he was having.  Possible masking of symptoms with the prednisone this was discussed with him.  He knows to go to the emergency room should his shortness of breath continue.  Possible chest x-ray and labs might be needed.  2. Essential hypertension Controlled, continue current medication no refills needed.  Follow Up: 11/19/2018  Perlie Mayo, DNP, AGNP-BC [place], [place],  [id number] Office Hours: Mon-Thurs 8 am-5 pm; Fri 8 am-12 pm Office Phone:  [phone]  Office Fax: [phone]

## 2018-09-08 NOTE — Patient Instructions (Signed)
    Thank you for coming into the office today. I appreciate the opportunity to provide you with the care for your health and wellness. Today we discussed: breathing trouble  Follow Up: 11/19/2018  No labs  Inhalers sent in to pharmacy. Try and let us know ahead of time when you need refills.  If you continue to have shortness of breath even with your inhalers, please go to the ER. Lungs are clear today, but you are on prednisone and that can cover some signs or symptoms of  Infection.   Please continue to practice social distancing to keep you, your family, and our community safe.  If you must go out, please wear a Mask and practice good handwashing.  [place] YOUR HANDS WELL AND FREQUENTLY. AVOID TOUCHING YOUR FACE, UNLESS YOUR HANDS ARE FRESHLY WASHED.  GET FRESH AIR DAILY. STAY HYDRATED WITH WATER.   It was a pleasure to see you and I look forward to continuing to work together on your health and well-being. Please do not hesitate to call the office if you need care or have questions about your care.  Have a wonderful day and week. With Gratitude, [name], DNP, AGNP-BC

## 2018-09-17 ENCOUNTER — Other Ambulatory Visit: Payer: Self-pay | Admitting: Family Medicine

## 2018-09-17 MED ORDER — PROAIR HFA 108 (90 BASE) MCG/ACT IN AERS: 2.0000 | INHALATION_SPRAY | Freq: Four times a day (QID) | RESPIRATORY_TRACT | 5 refills | Status: DC | PRN

## 2018-09-21 ENCOUNTER — Telehealth: Payer: Self-pay | Admitting: Family Medicine

## 2018-09-21 ENCOUNTER — Other Ambulatory Visit: Payer: Self-pay | Admitting: Family Medicine

## 2018-09-21 MED ORDER — PREDNISONE 5 MG (21) PO TBPK: 5.0000 mg | ORAL_TABLET | ORAL | 0 refills | Status: DC

## 2018-09-21 NOTE — Telephone Encounter (Signed)
Dose pack is pescribed

## 2018-09-21 NOTE — Telephone Encounter (Signed)
Tried to call pt back- no answer but requested prednisone for breathing- has appt tomorrow

## 2018-09-21 NOTE — Telephone Encounter (Signed)
Pt is calling needs predisone for breathing, I advised that I would send a note, and place him on the schedule for tomorrow.

## 2018-09-22 ENCOUNTER — Other Ambulatory Visit: Payer: Self-pay

## 2018-09-22 ENCOUNTER — Encounter: Payer: Self-pay | Admitting: Family Medicine

## 2018-09-22 ENCOUNTER — Ambulatory Visit (INDEPENDENT_AMBULATORY_CARE_PROVIDER_SITE_OTHER): Payer: Medicare Other | Admitting: Family Medicine

## 2018-09-22 VITALS — BP 126/72 | HR 98 | Temp 98.0°F | Resp 16 | Ht 67.0 in | Wt 129.0 lb

## 2018-09-22 DIAGNOSIS — I10 Essential (primary) hypertension: Secondary | ICD-10-CM

## 2018-09-22 DIAGNOSIS — E785 Hyperlipidemia, unspecified: Secondary | ICD-10-CM

## 2018-09-22 DIAGNOSIS — J449 Chronic obstructive pulmonary disease, unspecified: Secondary | ICD-10-CM

## 2018-09-22 MED ORDER — OMEPRAZOLE 40 MG PO CPDR: 40.0000 mg | DELAYED_RELEASE_CAPSULE | Freq: Every day | ORAL | 0 refills | Status: DC

## 2018-09-22 MED ORDER — METHYLPREDNISOLONE ACETATE 80 MG/ML IJ SUSP
80.0000 mg | Freq: Once | INTRAMUSCULAR | Status: AC
  Administered 2018-09-22: 80 mg via INTRAMUSCULAR

## 2018-09-22 NOTE — Assessment & Plan Note (Signed)
Controlled, no change in medication DASH diet and commitment to daily physical activity for a minimum of 30 minutes discussed and encouraged, as a part of hypertension management. The importance of attaining a healthy weight is also discussed.  BP/Weight 09/22/2018 09/08/2018 08/17/2018 06/08/2018 05/25/2018 04/27/2018 [id number]  Systolic BP 123XX123 [id number] 123XX123 123456 [id number]  Diastolic BP 72 87 84 88 80 88 69  Wt. (Lbs) 129 129 129 130 126 126.9 -  BMI 20.2 20.2 20.2 20.36 19.73 19.88 -

## 2018-09-22 NOTE — Assessment & Plan Note (Signed)
Hyperlipidemia:Low fat diet discussed and encouraged.   Lipid Panel  Lab Results  Component Value Date   CHOL 194 06/02/2018   HDL 74 06/02/2018   LDLCALC 104 (H) 06/02/2018   TRIG 74 06/02/2018   CHOLHDL 2.6 06/02/2018   Updated lab needed at/ before next visit.

## 2018-09-22 NOTE — Progress Notes (Signed)
   DONTAVIAN [name]     [name]: [id number]      DOB: August 23, 1950   HPI [name] is here with a 2 day h/o shortness of breath and chest tightness. Started prednisone prescribed yesterday and states he feels better. Denies fever , chills or sputum Has been doing home nebs 3 times daily  ROS Denies recent fever or chills. Denies sinus pressure, nasal congestion, ear pain or sore throat. Denies chest pains, palpitations and leg swelling Denies abdominal pain, nausea, vomiting,diarrhea or constipation.   Denies dysuria, frequency, hesitancy or incontinence. Denies joint pain, swelling and limitation in mobility. Denies headaches, seizures, numbness, or tingling. Denies depression, anxiety or insomnia. Denies skin break down or rash.   PE  BP 126/72   Pulse 98   Temp 98 F (36.7 C) (Temporal)   Resp 16   Ht 5\' 7"  (1.702 m)   Wt 129 lb (58.5 kg)   SpO2 93%   BMI 20.20 kg/m   Patient alert and oriented and in no cardiopulmonary distress.  HEENT: No facial asymmetry, EOMI,   oropharynx pink and moist.  Neck supple no JVD, no mass.  Chest: Markedly decreased air entry bilaterally, no wheezes  CVS: S1, S2 no murmurs, no S3.Regular rate.  ABD: Soft non tender.   Ext: No edema  MS: Adequate ROM spine, shoulders, hips and knees.  Skin: Intact, no ulcerations or rash noted.  Psych: Good eye contact, normal affect. Memory intact not anxious or depressed appearing.  CNS: CN 2-12 intact, power,  normal throughout.no focal deficits noted.   Assessment & Plan  Asthma with COPD (chronic obstructive pulmonary disease) Current acute flare, improving with oral prednisone, however, significantly reduced air entry noted Depo medrol 80 mg IM in office today  Essential hypertension Controlled, no change in medication DASH diet and commitment to daily physical activity for a minimum of 30 minutes discussed and encouraged, as a part of hypertension management. The importance of attaining  a healthy weight is also discussed.  BP/Weight 09/22/2018 09/08/2018 08/17/2018 06/08/2018 05/25/2018 04/27/2018 [id number]  Systolic BP 123XX123 [id number] 123XX123 123456 [id number]  Diastolic BP 72 87 84 88 80 88 69  Wt. (Lbs) 129 129 129 130 126 126.9 -  BMI 20.2 20.2 20.2 20.36 19.73 19.88 -       Hyperlipidemia Hyperlipidemia:Low fat diet discussed and encouraged.   Lipid Panel  Lab Results  Component Value Date   CHOL 194 06/02/2018   HDL 74 06/02/2018   LDLCALC 104 (H) 06/02/2018   TRIG 74 06/02/2018   CHOLHDL 2.6 06/02/2018   Updated lab needed at/ before next visit.

## 2018-09-22 NOTE — Assessment & Plan Note (Signed)
Current acute flare, improving with oral prednisone, however, significantly reduced air entry noted Depo medrol 80 mg IM in office today

## 2018-09-22 NOTE — Patient Instructions (Addendum)
Keep Novemebr appointment as scheduled, call if you need me sooner  Continue prednisone that you  Started yesterday. Take for 6 days total  Depo Medrol 80 mg IM in office today  New is omeprazole one daily for 2 weeks, start today, that is to protect your stomach  Thanks for choosing [facility], we consider it a privelige to serve you.

## 2018-10-20 ENCOUNTER — Ambulatory Visit (INDEPENDENT_AMBULATORY_CARE_PROVIDER_SITE_OTHER): Payer: Medicare Other | Admitting: Family Medicine

## 2018-10-20 ENCOUNTER — Other Ambulatory Visit: Payer: Self-pay

## 2018-10-20 ENCOUNTER — Encounter: Payer: Self-pay | Admitting: Family Medicine

## 2018-10-20 DIAGNOSIS — J449 Chronic obstructive pulmonary disease, unspecified: Secondary | ICD-10-CM | POA: Diagnosis not present

## 2018-10-20 MED ORDER — IPRATROPIUM BROMIDE 0.02 % IN SOLN: 0.5000 mg | Freq: Four times a day (QID) | RESPIRATORY_TRACT | 0 refills | Status: DC | PRN

## 2018-10-20 MED ORDER — PROAIR HFA 108 (90 BASE) MCG/ACT IN AERS: 2.0000 | INHALATION_SPRAY | Freq: Four times a day (QID) | RESPIRATORY_TRACT | 5 refills | Status: DC | PRN

## 2018-10-20 MED ORDER — IPRATROPIUM-ALBUTEROL 20-100 MCG/ACT IN AERS: INHALATION_SPRAY | RESPIRATORY_TRACT | 3 refills | Status: DC

## 2018-10-20 MED ORDER — FLUTICASONE-SALMETEROL 500-50 MCG/DOSE IN AEPB: 1.0000 | INHALATION_SPRAY | Freq: Two times a day (BID) | RESPIRATORY_TRACT | 3 refills | Status: DC

## 2018-10-20 NOTE — Progress Notes (Signed)
Virtual Visit via Telephone Note   This visit type was conducted due to national recommendations for restrictions regarding the COVID-19 Pandemic (e.g. social distancing) in an effort to limit this patient's exposure and mitigate transmission in our community.  Due to his co-morbid illnesses, this patient is at least at moderate risk for complications without adequate follow up.  This format is felt to be most appropriate for this patient at this time.  The patient did not have access to video technology/had technical difficulties with video requiring transitioning to audio format only (telephone).  All issues noted in this document were discussed and addressed.  No physical exam could be performed with this format.    Evaluation Performed:  Follow-up visit  Date:  10/20/2018   ID:  [name], DOB Mar 28, 1950, [name] [id number]  Patient Location: Home Provider Location: Office  Location of Patient: Home Location of Provider: Telehealth Consent was obtain for visit to be over via telehealth. I verified that I am speaking with the correct person using two identifiers.  PCP:  Fayrene Helper, MD   Chief Complaint:    History of Present Illness:    [name] is a 68 y.o. male with extensive history of asthma, COPD, depression, history of marijuana use and tobacco use, hyperlipidemia, hypertension.  Reports today that he had shortness of breath when he woke up.  Reports that he feels like he might need prednisone.  When questioned about using his inhalers he reports he is out of his inhalers.  Appears that he has some refills on his sailors but he has not called them in. He denies exposure to COVID.  Denies fever, chills, cough.  Has baseline shortness of breath but this had worsened and that is why he called the office.  The patient does not have symptoms concerning for COVID-19 infection (fever, chills, cough, or new shortness of breath).   Past Medical, Surgical, Social  History, Allergies, and Medications have been Reviewed.   Past Medical History:  Diagnosis Date  . Asthma   . COPD (chronic obstructive pulmonary disease) ([place])   . CVA (cerebral vascular accident) ([place]) 2008   with temporary vision loss   . Depression   . Elevated PSA 2014   no diagnosis pf prostate cancer in 07/2014  . History of substance abuse ([place]) 01/14/2011   Marijuana  . History of tobacco abuse 01/14/2011  . Hyperlipidemia   . Hypertension   . Marijuana abuse   . Nicotine addiction   . Prediabetes 2014   History reviewed. No pertinent surgical history.   Current Meds  Medication Sig  . albuterol (PROVENTIL) (2.5 MG/3ML) 0.083% nebulizer solution TAKE 1 AMPULE (3 MLS) BY NEBULIZATION AS DIRECTED EVERY 4 HOURS AS NEEDED.  [name] Kitchen aspirin 81 MG tablet Take 81 mg by mouth daily.  . benzonatate (TESSALON) 100 MG capsule Take 1 capsule (100 mg total) by mouth 2 (two) times daily as needed for cough.  . Fluticasone-Salmeterol (ADVAIR DISKUS) 500-50 MCG/DOSE AEPB Inhale 1 puff into the lungs 2 (two) times daily. INHALE 1 PUFF BY MOUTH TWICE A DAY. RINSE MOUTH AFTER USE.  [name] Kitchen ipratropium (ATROVENT) 0.02 % nebulizer solution Take 2.5 mLs (0.5 mg total) by nebulization every 6 (six) hours as needed for wheezing or shortness of breath.  . Ipratropium-Albuterol (COMBIVENT) 20-100 MCG/ACT AERS respimat One puff into lungs every 6 hours  . omeprazole (PRILOSEC) 40 MG capsule Take 1 capsule (40 mg total) by mouth daily.  [name] Kitchen  pravastatin (PRAVACHOL) 40 MG tablet TAKE (1) TABLET BY MOUTH AT BEDTIME.  [name] Kitchen PROAIR HFA 108 (90 Base) MCG/ACT inhaler Inhale 2 puffs into the lungs every 6 (six) hours as needed for wheezing or shortness of breath.  . triamterene-hydrochlorothiazide (MAXZIDE-25) 37.5-25 MG tablet TAKE 1 & 1/2 TABLETS BY MOUTH ONCE DAILY.  . [DISCONTINUED] Fluticasone-Salmeterol (ADVAIR DISKUS) 500-50 MCG/DOSE AEPB Inhale 1 puff into the lungs 2 (two) times daily. INHALE 1 PUFF BY MOUTH TWICE A DAY.  RINSE MOUTH AFTER USE.  . [DISCONTINUED] ipratropium (ATROVENT) 0.02 % nebulizer solution INHALE 1 VIAL VIA NEBULIZER EVERY 6-8 HOURS FOR WHEEZING.  . [DISCONTINUED] Ipratropium-Albuterol (COMBIVENT) 20-100 MCG/ACT AERS respimat One puff into lungs every 6 hours  . [DISCONTINUED] PROAIR HFA 108 (90 Base) MCG/ACT inhaler Inhale 2 puffs into the lungs every 6 (six) hours as needed for wheezing or shortness of breath.     Allergies:   Patient has no known allergies.   Social History   Tobacco Use  . Smoking status: Former Smoker    Packs/day: 0.50    Years: 40.00    Pack years: 20.00    Types: Cigarettes    Quit date: 04/14/2015    Years since quitting: 3.5  . Smokeless tobacco: Never Used  Substance Use Topics  . Alcohol use: Yes    Alcohol/week: 3.0 standard drinks    Types: 3 Cans of beer per week    Comment: occasionally  . Drug use: No    Comment: pt quit 20 years ago     Family Hx: The patient's family history is not on file.  ROS:   Please see the history of present illness.    All other systems reviewed and are negative.   Labs/Other Tests and Data Reviewed:    Recent Labs: 11/24/2017: B Natriuretic Peptide 26.0 04/19/2018: Hemoglobin 13.1; Platelets 212; TSH 0.396 06/02/2018: ALT 12; BUN 14; Creat 0.97; Potassium 3.5; Sodium 140   Recent Lipid Panel Lab Results  Component Value Date/Time   CHOL 194 06/02/2018 09:15 AM   TRIG 74 06/02/2018 09:15 AM   HDL 74 06/02/2018 09:15 AM   CHOLHDL 2.6 06/02/2018 09:15 AM   LDLCALC 104 (H) 06/02/2018 09:15 AM    Wt Readings from Last 3 Encounters:  09/22/18 129 lb (58.5 kg)  09/08/18 129 lb (58.5 kg)  08/17/18 129 lb (58.5 kg)     Objective:    Vital Signs:  There were no vitals taken for this visit.   GEN:  Alert and oriented RESPIRATORY:  No shortness of breath or cough noted in conversation PSYCH:  Normal affect and mood  ASSESSMENT & PLAN:     1. Asthma with COPD (chronic obstructive pulmonary disease)  ([place]) Reports that shortness of breath has decreased.  He denies having any of his inhalers at this time.  Appears that he has some refills on some of them will send to make sure that he does have refills on all of them.  Advised for him to take his medications as directed and to not run out if possible.  Additionally no use of prednisone as we are unsure if it is just because he is not been using his inhalers for the last couple of days.  - Fluticasone-Salmeterol (ADVAIR DISKUS) 500-50 MCG/DOSE AEPB; Inhale 1 puff into the lungs 2 (two) times daily. INHALE 1 PUFF BY MOUTH TWICE A DAY. RINSE MOUTH AFTER USE.  Dispense: 1 each; Refill: 3 - Ipratropium-Albuterol (COMBIVENT) 20-100 MCG/ACT AERS respimat; One puff into  lungs every 6 hours  Dispense: 4 g; Refill: 3 - PROAIR HFA 108 (90 Base) MCG/ACT inhaler; Inhale 2 puffs into the lungs every 6 (six) hours as needed for wheezing or shortness of breath.  Dispense: 18 g; Refill: 5 - ipratropium (ATROVENT) 0.02 % nebulizer solution; Take 2.5 mLs (0.5 mg total) by nebulization every 6 (six) hours as needed for wheezing or shortness of breath.  Dispense: 312.5 mL; Refill: 0   Time:   Today, I have spent 10 minutes with the patient with telehealth technology discussing the above problems.     Medication Adjustments/Labs and Tests Ordered: Current medicines are reviewed at length with the patient today.  Concerns regarding medicines are outlined above.   Tests Ordered: No orders of the defined types were placed in this encounter.   Medication Changes: Meds ordered this encounter  Medications  . Fluticasone-Salmeterol (ADVAIR DISKUS) 500-50 MCG/DOSE AEPB    Sig: Inhale 1 puff into the lungs 2 (two) times daily. INHALE 1 PUFF BY MOUTH TWICE A DAY. RINSE MOUTH AFTER USE.    Dispense:  1 each    Refill:  3    Order Specific Question:   Supervising Provider    Answer:   SIMPSON, MARGARET E [id number]  . Ipratropium-Albuterol (COMBIVENT) 20-100 MCG/ACT AERS  respimat    Sig: One puff into lungs every 6 hours    Dispense:  4 g    Refill:  3    Order Specific Question:   Supervising Provider    Answer:   SIMPSON, MARGARET E [id number]  . PROAIR HFA 108 (90 Base) MCG/ACT inhaler    Sig: Inhale 2 puffs into the lungs every 6 (six) hours as needed for wheezing or shortness of breath.    Dispense:  18 g    Refill:  5    Dispense Proair due to formulary coverage please    Order Specific Question:   Supervising Provider    Answer:   [name] E [id number]  . ipratropium (ATROVENT) 0.02 % nebulizer solution    Sig: Take 2.5 mLs (0.5 mg total) by nebulization every 6 (six) hours as needed for wheezing or shortness of breath.    Dispense:  312.5 mL    Refill:  0    Order Specific Question:   Supervising Provider    Answer:   Fayrene Helper [id number]    Disposition:  Follow up 11/19/2018  Signed, Perlie Mayo, NP  10/20/2018 3:35 PM     [facility]

## 2018-10-20 NOTE — Patient Instructions (Addendum)
    I appreciate the opportunity to provide you with the care for your health and wellness. Today we discussed: use of inhalers for shortness of breath  Follow up: as scheduled for 11/19/2018  No labs or referrals today  All of your inhalers have been resubmitted to [facility].  Please make sure that you pick these up and take them as directed.  If you run out of 1 please call the office or call [place] for refill.  As if you do not take these you can develop shortness of breath and coughing and sometimes it will not be able to get you breathing back under control, you can also end up in the hospital.  It was a pleasure to see you and I look forward to continuing to work together on your health and well-being. Please do not hesitate to call the office if you need care or have questions about your care.  Have a wonderful day and week. With Gratitude, [name], DNP, AGNP-BC

## 2018-10-27 ENCOUNTER — Telehealth: Payer: Self-pay | Admitting: Family Medicine

## 2018-10-27 NOTE — Telephone Encounter (Signed)
PT called and said he never got his medication

## 2018-10-28 ENCOUNTER — Other Ambulatory Visit: Payer: Self-pay

## 2018-10-28 MED ORDER — ALBUTEROL SULFATE (2.5 MG/3ML) 0.083% IN NEBU: INHALATION_SOLUTION | RESPIRATORY_TRACT | 0 refills | Status: DC

## 2018-10-28 NOTE — Telephone Encounter (Signed)
Albuterol neb solution called into Pharmacy

## 2018-10-30 ENCOUNTER — Telehealth: Payer: Self-pay | Admitting: Family Medicine

## 2018-10-30 NOTE — Telephone Encounter (Signed)
Pt came by he needs a prescription of Predinsone

## 2018-11-02 NOTE — Telephone Encounter (Signed)
Already sent.

## 2018-11-10 ENCOUNTER — Telehealth: Payer: Self-pay

## 2018-11-10 NOTE — Telephone Encounter (Signed)
Combivent (pt is using it more often) & Pro air sent [employer]

## 2018-11-10 NOTE — Telephone Encounter (Signed)
Requests refills but too early to fill. Has been using it more than recommended. Please advise

## 2018-11-10 NOTE — Telephone Encounter (Signed)
pls over ride and refill if possible, if not explain to pt not possible

## 2018-11-11 NOTE — Telephone Encounter (Signed)
rx for inhalers being filled and left message for pt making him aware

## 2018-11-12 DIAGNOSIS — E119 Type 2 diabetes mellitus without complications: Secondary | ICD-10-CM | POA: Diagnosis not present

## 2018-11-12 DIAGNOSIS — E785 Hyperlipidemia, unspecified: Secondary | ICD-10-CM | POA: Diagnosis not present

## 2018-11-12 DIAGNOSIS — I10 Essential (primary) hypertension: Secondary | ICD-10-CM | POA: Diagnosis not present

## 2018-11-13 LAB — COMPLETE METABOLIC PANEL WITH GFR
AG Ratio: 1.7 (calc) (ref 1.0–2.5)
ALT: 16 U/L (ref 9–46)
AST: 21 U/L (ref 10–35)
Albumin: 3.9 g/dL (ref 3.6–5.1)
Alkaline phosphatase (APISO): 52 U/L (ref 35–144)
BUN: 14 mg/dL (ref 7–25)
CO2: 32 mmol/L (ref 20–32)
Calcium: 9.4 mg/dL (ref 8.6–10.3)
Chloride: 102 mmol/L (ref 98–110)
Creat: 0.97 mg/dL (ref 0.70–1.18)
GFR, Est African American: 91 mL/min/{1.73_m2} (ref >=60)
GFR, Est Non African American: 79 mL/min/{1.73_m2} (ref >=60)
Globulin: 2.3 g/dL (calc) (ref 1.9–3.7)
Glucose, Bld: 116 mg/dL — ABNORMAL HIGH (ref 65–99)
Potassium: 4.2 mmol/L (ref 3.5–5.3)
Sodium: 142 mmol/L (ref 135–146)
Total Bilirubin: 0.6 mg/dL (ref 0.2–1.2)
Total Protein: 6.2 g/dL (ref 6.1–8.1)

## 2018-11-13 LAB — LIPID PANEL
Cholesterol: 189 mg/dL (ref <200)
HDL: 83 mg/dL (ref >=40)
LDL Cholesterol (Calc): 93 mg/dL (calc)
Non-HDL Cholesterol (Calc): 106 mg/dL (calc) (ref <130)
Total CHOL/HDL Ratio: 2.3 (calc) (ref <5.0)
Triglycerides: 51 mg/dL (ref <150)

## 2018-11-13 LAB — HEMOGLOBIN A1C
Hgb A1c MFr Bld: 6.2 % of total Hgb — ABNORMAL HIGH (ref <5.7)
Mean Plasma Glucose: 131 (calc)
eAG (mmol/L): 7.3 (calc)

## 2018-11-19 ENCOUNTER — Ambulatory Visit (INDEPENDENT_AMBULATORY_CARE_PROVIDER_SITE_OTHER): Payer: Medicare Other | Admitting: Family Medicine

## 2018-11-19 ENCOUNTER — Encounter: Payer: Self-pay | Admitting: Family Medicine

## 2018-11-19 ENCOUNTER — Other Ambulatory Visit: Payer: Self-pay

## 2018-11-19 ENCOUNTER — Other Ambulatory Visit: Payer: Self-pay | Admitting: *Deleted

## 2018-11-19 VITALS — BP 128/80 | HR 104 | Temp 97.7°F | Resp 15 | Ht 67.0 in | Wt 131.0 lb

## 2018-11-19 DIAGNOSIS — Z594 Lack of adequate food and safe drinking water: Secondary | ICD-10-CM

## 2018-11-19 DIAGNOSIS — Z5941 Food insecurity: Secondary | ICD-10-CM

## 2018-11-19 DIAGNOSIS — J449 Chronic obstructive pulmonary disease, unspecified: Secondary | ICD-10-CM

## 2018-11-19 DIAGNOSIS — Z0000 Encounter for general adult medical examination without abnormal findings: Secondary | ICD-10-CM | POA: Diagnosis not present

## 2018-11-19 NOTE — Patient Outreach (Signed)
Referral received 11/19/18 from primary MD for limited food supply, lives alone, needs assistance with getting meals on wheels.  Pt with diagnoses COPD, pneumonia, HTN, history substance abuse.  Outreach call to pt for screening, someone answered the phone, did not speak and immediately hung up.  RN CM mailed unsuccessful outreach letter to pt home.  PLAN Outreach pt in 3-4 business days  Jacqlyn Larsen [facility], [place] [phone]

## 2018-11-19 NOTE — Patient Instructions (Signed)
F/U in office with MD in 4 months, call if you nee me sooner  Excellent labs and exam  We will check on cologuard test result   Check your pharmacy for shingrix vaccines #1 and # 2  We will tery to get you set up with meals on wheels  Thanks for choosing [facility], we consider it a privelige to serve you.

## 2018-11-19 NOTE — Progress Notes (Signed)
   [name]     [name]: [id number]      DOB: Nov 15, 1950   HPI: Patient is in for annual physical exam. No other health concerns are expressed or addressed at the visit. Recent labs,  are reviewed and are excellent Immunization is reviewed , and is up to date   PE;  BP 128/80   Pulse (!) 104   Temp 97.7 F (36.5 C) (Temporal)   Resp 15   Ht 5\' 7"  (1.702 m)   Wt 131 lb (59.4 kg)   SpO2 95%   BMI 20.52 kg/m ]  Pleasant male, alert and oriented x 3, in no cardio-pulmonary distress. Afebrile. HEENT No facial trauma or asymetry. Sinuses non tender. EOMI External ears normal,  Neck: supple, no adenopathy,JVD or thyromegaly.No bruits.  Chest: Clear to ascultation bilaterally.No crackles or wheezes.decreased air entry bilaterally Non tender to palpation  Cardiovascular system; Heart sounds normal,  S1 and  S2 ,no S3.  No murmur, or thrill. Apical beat not displaced Peripheral pulses normal.  Abdomen: Soft, non tender, no organomegaly or masses. No bruits. Bowel sounds normal. No guarding, tenderness or rebound.    Musculoskeletal exam: Full ROM of spine, hips , shoulders and knees. No deformity ,swelling or crepitus noted. No muscle wasting or atrophy.   Neurologic: Cranial nerves 2 to 12 intact. Power, tone ,sensation and reflexes normal throughout. No disturbance in gait. No tremor.  Skin: Intact, no ulceration, erythema , scaling or rash noted. Pigmentation normal throughout  Psych; Normal mood and affect. Judgement and concentration normal   Assessment & Plan:  Annual physical exam Annual exam as documented. Counseling done  re healthy lifestyle involving commitment to 150 minutes exercise per week, heart healthy diet, and attaining healthy weight.The importance of adequate sleep also discussed. Immunization and cancer screening needs are specifically addressed at this visit.   Food insecurity Pt lives alone with no social support. He has to buy  his food and also prepare it, he has severe chronic lung disease which makes this very difficult, will reach out to see if he cannbe added to the meals on wheels program

## 2018-11-22 DIAGNOSIS — Z5941 Food insecurity: Secondary | ICD-10-CM | POA: Insufficient documentation

## 2018-11-22 DIAGNOSIS — Z594 Lack of adequate food and safe drinking water: Secondary | ICD-10-CM | POA: Insufficient documentation

## 2018-11-22 NOTE — Assessment & Plan Note (Signed)
Pt lives alone with no social support. He has to buy his food and also prepare it, he has severe chronic lung disease which makes this very difficult, will reach out to see if he cannbe added to the meals on wheels program

## 2018-11-22 NOTE — Assessment & Plan Note (Signed)
Annual exam as documented. Counseling done  re healthy lifestyle involving commitment to 150 minutes exercise per week, heart healthy diet, and attaining healthy weight.The importance of adequate sleep also discussed.  Immunization and cancer screening needs are specifically addressed at this visit.  

## 2018-11-24 ENCOUNTER — Other Ambulatory Visit: Payer: Self-pay | Admitting: *Deleted

## 2018-11-24 NOTE — Patient Outreach (Signed)
Outreach call to pt for screening (2nd attempt), no answer to telephone and no option to leave voicemail.  PLAN Outreach pt in 3-4 business days  Mayleigh Tetrault RNC, BSN [facility] Coordinator [phone]   

## 2018-11-27 ENCOUNTER — Other Ambulatory Visit: Payer: Self-pay | Admitting: Family Medicine

## 2018-11-27 ENCOUNTER — Telehealth: Payer: Self-pay | Admitting: *Deleted

## 2018-11-27 ENCOUNTER — Other Ambulatory Visit: Payer: Self-pay | Admitting: *Deleted

## 2018-11-27 DIAGNOSIS — I10 Essential (primary) hypertension: Secondary | ICD-10-CM

## 2018-11-27 MED ORDER — PREDNISONE 5 MG (21) PO TBPK: 5.0000 mg | ORAL_TABLET | ORAL | 0 refills | Status: DC

## 2018-11-27 NOTE — Telephone Encounter (Signed)
pls let him know a dose pack has been sent in, hope this gets him throuh his flare, if not ED

## 2018-11-27 NOTE — Telephone Encounter (Signed)
Pt wanted to know if Dr Moshe Cipro would call him in some prednisone for his breathing.

## 2018-11-27 NOTE — Patient Outreach (Signed)
Third attempt outreach for screening, MD referral states "limited food supply, lives alone, needs assistance with getting meals on wheels"  RNCM spoke with pt, HIPAA verified, pt reports he does "pretty well at home", states he does not have any significant assistance in his life, states " no family"  Pt rides moped but due to cold weather, it is increasingly more difficult to get anywhere,  RN CM placed order for [facility] CSW for food insecurity issues/ obtaining meals on wheels and any transportation issues pt may have.  RN CM mailed successful outreach letter to pt home with 24 hour nurse line magnet, pamphlet, consent form.    Social work to follow   Jacqlyn Larsen [facility], BSN [facility] [employer] [phone]

## 2018-11-27 NOTE — Progress Notes (Signed)
pred 5  

## 2018-11-30 ENCOUNTER — Ambulatory Visit (INDEPENDENT_AMBULATORY_CARE_PROVIDER_SITE_OTHER): Payer: Medicare Other | Admitting: Family Medicine

## 2018-11-30 ENCOUNTER — Other Ambulatory Visit: Payer: Self-pay

## 2018-11-30 VITALS — BP 128/80 | Ht 67.0 in | Wt 131.0 lb

## 2018-11-30 DIAGNOSIS — Z0000 Encounter for general adult medical examination without abnormal findings: Secondary | ICD-10-CM | POA: Diagnosis not present

## 2018-11-30 NOTE — Patient Instructions (Signed)
[name] , Thank you for taking time to come for your Medicare Wellness Visit. I appreciate your ongoing commitment to your health goals. Please review the following plan we discussed and let me know if I can assist you in the future.   Please continue to practice social distancing to keep you, your family, and our community safe.  If you must go out, please wear a Mask and practice good handwashing.  We hope that you have a safe, happy, and healthy Holiday Season! See you in the New Year :)  Screening recommendations/referrals: Colonoscopy: Due 2021 Recommended yearly ophthalmology/optometry visit for glaucoma screening and checkup Recommended yearly dental visit for hygiene and checkup  Vaccinations: Influenza vaccine: up to date Pneumococcal vaccine: up to date Tdap vaccine: Due 2021 Shingles vaccine: In process of getting it now   Advanced directives: Does not currently have   Conditions/risks identified: Falls   Next appointment: 03/23/2019   Preventive Care 65 Years and Older, Male Preventive care refers to lifestyle choices and visits with your health care provider that can promote health and wellness. What does preventive care include?  A yearly physical exam. This is also called an annual well check.  Dental exams once or twice a year.  Routine eye exams. Ask your health care provider how often you should have your eyes checked.  Personal lifestyle choices, including:  Daily care of your teeth and gums.  Regular physical activity.  Eating a healthy diet.  Avoiding tobacco and drug use.  Limiting alcohol use.  Practicing safe sex.  Taking low doses of aspirin every day.  Taking vitamin and mineral supplements as recommended by your health care provider. What happens during an annual well check? The services and screenings done by your health care provider during your annual well check will depend on your age, overall health, lifestyle risk factors, and  family history of disease. Counseling  Your health care provider may ask you questions about your:  Alcohol use.  Tobacco use.  Drug use.  Emotional well-being.  Home and relationship well-being.  Sexual activity.  Eating habits.  History of falls.  Memory and ability to understand (cognition).  Work and work Statistician. Screening  You may have the following tests or measurements:  Height, weight, and BMI.  Blood pressure.  Lipid and cholesterol levels. These may be checked every 5 years, or more frequently if you are over 3 years old.  Skin check.  Lung cancer screening. You may have this screening every year starting at age 51 if you have a 30-pack-year history of smoking and currently smoke or have quit within the past 15 years.  Fecal occult blood test (FOBT) of the stool. You may have this test every year starting at age 64.  Flexible sigmoidoscopy or colonoscopy. You may have a sigmoidoscopy every 5 years or a colonoscopy every 10 years starting at age 96.  Prostate cancer screening. Recommendations will vary depending on your family history and other risks.  Hepatitis C blood test.  Hepatitis B blood test.  Sexually transmitted disease (STD) testing.  Diabetes screening. This is done by checking your blood sugar (glucose) after you have not eaten for a while (fasting). You may have this done every 1-3 years.  Abdominal aortic aneurysm (AAA) screening. You may need this if you are a current or former smoker.  Osteoporosis. You may be screened starting at age 49 if you are at high risk. Talk with your health care provider about your test results,  treatment options, and if necessary, the need for more tests. Vaccines  Your health care provider may recommend certain vaccines, such as:  Influenza vaccine. This is recommended every year.  Tetanus, diphtheria, and acellular pertussis (Tdap, Td) vaccine. You may need a Td booster every 10 years.  Zoster  vaccine. You may need this after age 75.  Pneumococcal 13-valent conjugate (PCV13) vaccine. One dose is recommended after age 45.  Pneumococcal polysaccharide (PPSV23) vaccine. One dose is recommended after age 76. Talk to your health care provider about which screenings and vaccines you need and how often you need them. This information is not intended to replace advice given to you by your health care provider. Make sure you discuss any questions you have with your health care provider. Document Released: 01/20/2015 Document Revised: 09/13/2015 Document Reviewed: 10/25/2014 Elsevier Interactive Patient Education  2017 [employer] Prevention in the Home Falls can cause injuries. They can happen to people of all ages. There are many things you can do to make your home safe and to help prevent falls. What can I do on the outside of my home?  Regularly fix the edges of walkways and driveways and fix any cracks.  Remove anything that might make you trip as you walk through a door, such as a raised step or threshold.  Trim any bushes or trees on the path to your home.  Use bright outdoor lighting.  Clear any walking paths of anything that might make someone trip, such as rocks or tools.  Regularly check to see if handrails are loose or broken. Make sure that both sides of any steps have handrails.  Any raised decks and porches should have guardrails on the edges.  Have any leaves, snow, or ice cleared regularly.  Use sand or salt on walking paths during winter.  Clean up any spills in your garage right away. This includes oil or grease spills. What can I do in the bathroom?  Use night lights.  Install grab bars by the toilet and in the tub and shower. Do not use towel bars as grab bars.  Use non-skid mats or decals in the tub or shower.  If you need to sit down in the shower, use a plastic, non-slip stool.  Keep the floor dry. Clean up any water that spills on the  floor as soon as it happens.  Remove soap buildup in the tub or shower regularly.  Attach bath mats securely with double-sided non-slip rug tape.  Do not have throw rugs and other things on the floor that can make you trip. What can I do in the bedroom?  Use night lights.  Make sure that you have a light by your bed that is easy to reach.  Do not use any sheets or blankets that are too big for your bed. They should not hang down onto the floor.  Have a firm chair that has side arms. You can use this for support while you get dressed.  Do not have throw rugs and other things on the floor that can make you trip. What can I do in the kitchen?  Clean up any spills right away.  Avoid walking on wet floors.  Keep items that you use a lot in easy-to-reach places.  If you need to reach something above you, use a strong step stool that has a grab bar.  Keep electrical cords out of the way.  Do not use floor polish or wax that makes floors  slippery. If you must use wax, use non-skid floor wax.  Do not have throw rugs and other things on the floor that can make you trip. What can I do with my stairs?  Do not leave any items on the stairs.  Make sure that there are handrails on both sides of the stairs and use them. Fix handrails that are broken or loose. Make sure that handrails are as long as the stairways.  Check any carpeting to make sure that it is firmly attached to the stairs. Fix any carpet that is loose or worn.  Avoid having throw rugs at the top or bottom of the stairs. If you do have throw rugs, attach them to the floor with carpet tape.  Make sure that you have a light switch at the top of the stairs and the bottom of the stairs. If you do not have them, ask someone to add them for you. What else can I do to help prevent falls?  Wear shoes that:  Do not have high heels.  Have rubber bottoms.  Are comfortable and fit you well.  Are closed at the toe. Do not wear  sandals.  If you use a stepladder:  Make sure that it is fully opened. Do not climb a closed stepladder.  Make sure that both sides of the stepladder are locked into place.  Ask someone to hold it for you, if possible.  Clearly mark and make sure that you can see:  Any grab bars or handrails.  First and last steps.  Where the edge of each step is.  Use tools that help you move around (mobility aids) if they are needed. These include:  Canes.  Walkers.  Scooters.  Crutches.  Turn on the lights when you go into a dark area. Replace any light bulbs as soon as they burn out.  Set up your furniture so you have a clear path. Avoid moving your furniture around.  If any of your floors are uneven, fix them.  If there are any pets around you, be aware of where they are.  Review your medicines with your doctor. Some medicines can make you feel dizzy. This can increase your chance of falling. Ask your doctor what other things that you can do to help prevent falls. This information is not intended to replace advice given to you by your health care provider. Make sure you discuss any questions you have with your health care provider. Document Released: 10/20/2008 Document Revised: 06/01/2015 Document Reviewed: 01/28/2014 Elsevier Interactive Patient Education  2017 [employer].

## 2018-11-30 NOTE — Progress Notes (Signed)
Subjective:   [name] is a 68 y.o. male who presents for Medicare Annual/Subsequent preventive examination.  Location of Patient: Home Location of Provider: Telehealth Consent was obtain for visit to be over via telehealth.  I verified that I am speaking with the correct person using two identifiers.    Review of Systems:   Cardiac Risk Factors include: advanced age (>15men, >75 women);dyslipidemia;hypertension;male gender;sedentary lifestyle     Objective:    Vitals: There were no vitals taken for this visit.  There is no height or weight on file to calculate BMI.  Advanced Directives 11/30/2018 04/24/2018 04/18/2018 04/18/2018 03/24/2018 11/24/2017 11/24/2017  Does Patient Have a Medical Advance Directive? No Unable to assess, patient is non-responsive or altered mental status No No No No No  Would patient like information on creating a medical advance directive? No - Patient declined No - Patient declined No - Patient declined - Yes (ED - Information included in AVS) No - Patient declined -  Pre-existing out of facility DNR order (yellow form or pink MOST form) - - - - - - -    Tobacco Social History   Tobacco Use  Smoking Status Former Smoker   Packs/day: 0.50   Years: 40.00   Pack years: 20.00   Types: Cigarettes   Quit date: 04/14/2015   Years since quitting: 3.6  Smokeless Tobacco Never Used     Counseling given: Not Answered   Clinical Intake:  Pre-visit preparation completed: No  Pain : No/denies pain     Diabetes: No  How often do you need to have someone help you when you read instructions, pamphlets, or other written materials from your doctor or pharmacy?: 1 - Never What is the last grade level you completed in school?: 11  Interpreter Needed?: No     Past Medical History:  Diagnosis Date   Asthma    COPD (chronic obstructive pulmonary disease) ([place])    CVA (cerebral vascular accident) ([place]) 2008   with temporary vision loss      Depression    Elevated PSA 2014   no diagnosis pf prostate cancer in 07/2014   History of substance abuse ([place]) 01/14/2011   Marijuana   History of tobacco abuse 01/14/2011   Hyperlipidemia    Hypertension    Marijuana abuse    Nicotine addiction    Prediabetes 2014   No past surgical history on file. No family history on file. Social History   Socioeconomic History   Marital status: Widowed    Spouse name: Not on file   Number of children: 1   Years of education: Not on file   Highest education level: Not on file  Occupational History   Occupation: unemployed   Social Designer, fashion/clothing strain: Patient refused   Food insecurity    Worry: Patient refused    Inability: Patient refused   Transportation needs    Medical: Patient refused    Non-medical: Patient refused  Tobacco Use   Smoking status: Former Smoker    Packs/day: 0.50    Years: 40.00    Pack years: 20.00    Types: Cigarettes    Quit date: 04/14/2015    Years since quitting: 3.6   Smokeless tobacco: Never Used  Substance and Sexual Activity   Alcohol use: Yes    Alcohol/week: 3.0 standard drinks    Types: 3 Cans of beer per week    Comment: occasionally   Drug use: No  Comment: pt quit 20 years ago   Sexual activity: Not Currently  Lifestyle   Physical activity    Days per week: Patient refused    Minutes per session: Patient refused   Stress: Patient refused  Relationships   Social connections    Talks on phone: Patient refused    Gets together: Patient refused    Attends religious service: Patient refused    Active member of club or organization: Patient refused    Attends meetings of clubs or organizations: Patient refused    Relationship status: Patient refused  Other Topics Concern   Not on file  Social History Narrative   Not on file    Outpatient Encounter Medications as of 11/30/2018  Medication Sig   albuterol (PROVENTIL) (2.5 MG/3ML) 0.083%  nebulizer solution TAKE 1 AMPULE (3 MLS) BY NEBULIZATION AS DIRECTED EVERY 4 HOURS AS NEEDED.   aspirin 81 MG tablet Take 81 mg by mouth daily.   Fluticasone-Salmeterol (ADVAIR DISKUS) 500-50 MCG/DOSE AEPB Inhale 1 puff into the lungs 2 (two) times daily. INHALE 1 PUFF BY MOUTH TWICE A DAY. RINSE MOUTH AFTER USE.   ipratropium (ATROVENT) 0.02 % nebulizer solution Take 2.5 mLs (0.5 mg total) by nebulization every 6 (six) hours as needed for wheezing or shortness of breath.   Ipratropium-Albuterol (COMBIVENT) 20-100 MCG/ACT AERS respimat One puff into lungs every 6 hours   omeprazole (PRILOSEC) 40 MG capsule Take 1 capsule (40 mg total) by mouth daily.   pravastatin (PRAVACHOL) 40 MG tablet TAKE (1) TABLET BY MOUTH AT BEDTIME.   predniSONE (STERAPRED UNI-PAK 21 TAB) 5 MG (21) TBPK tablet Take 1 tablet (5 mg total) by mouth as directed. Use as directed   PROAIR HFA 108 (90 Base) MCG/ACT inhaler Inhale 2 puffs into the lungs every 6 (six) hours as needed for wheezing or shortness of breath.   triamterene-hydrochlorothiazide (MAXZIDE-25) 37.5-25 MG tablet TAKE 1 & 1/2 TABLETS BY MOUTH ONCE DAILY.   [DISCONTINUED] simvastatin (ZOCOR) 40 MG tablet Take 40 mg by mouth daily.   No facility-administered encounter medications on file as of 11/30/2018.     Activities of Daily Living In your present state of health, do you have any difficulty performing the following activities: 11/30/2018 04/18/2018  Hearing? N N  Vision? N N  Difficulty concentrating or making decisions? N Y  Walking or climbing stairs? N N  Dressing or bathing? N N  Doing errands, shopping? Y N  Comment no transportation -  Some recent data might be hidden    Patient Care Team: Fayrene Helper, MD as PCP - General Irine Seal, MD as Attending Physician (Urology) Sinda Du, MD as Consulting Physician (Pulmonary Disease) Gala Romney Cristopher Estimable, MD as Consulting Physician (Gastroenterology) Danie Binder, MD as  Consulting Physician (Gastroenterology) Chrismon, Amber as [facility]   Assessment:   This is a routine wellness examination for [name].  Exercise Activities and Dietary recommendations Current Exercise Habits: The patient does not participate in regular exercise at present  Goals     Increase water intake     Patient would like to increase his water intake to at least 4 glasses a day.       Fall Risk Fall Risk  11/30/2018 11/19/2018 10/20/2018 09/22/2018 08/17/2018  Falls in the past year? 0 0 0 0 0  Number falls in past yr: 0 0 0 0 -  Injury with Fall? 0 0 0 0 0  Risk for fall due to : - - - - -  Follow up Falls evaluation completed;Education provided;Falls prevention discussed;Follow up appointment - - - -   Is the patient's home free of loose throw rugs in walkways, pet beds, electrical cords, etc?   yes      Grab bars in the bathroom? no      Handrails on the stairs?   yes      Adequate lighting?   yes  Timed Get Up and Go Performed: n/a  Depression Screen PHQ 2/9 Scores 11/30/2018 11/27/2018 11/19/2018 10/20/2018  PHQ - 2 Score 0 0 0 3  PHQ- 9 Score - - 0 12  Exception Documentation - - - -    Cognitive Function MMSE - Mini Mental State Exam 03/04/2016  Orientation to time 2  Orientation to Place 5  Registration 3  Attention/ Calculation 0  Attention/Calculation-comments unable to spell  Recall 3  Language- name 2 objects 2  Language- repeat 1  Language- follow 3 step command 3  Language- read & follow direction 0  Language-read & follow direction-comments unable to read and write  Write a sentence 1  Copy design 1  Total score 21     6CIT Screen 11/30/2018 03/24/2018  What Year? 0 points 0 points  What month? 3 points 0 points  What time? 0 points 0 points  Count back from 20 0 points 2 points  Months in reverse 4 points 4 points  Repeat phrase 2 points 2 points  Total Score 9 8    Immunization History  Administered  Date(s) Administered   Fluad Quad(high Dose 65+) 08/31/2018   Influenza Split 10/23/2011   Influenza Whole 10/02/2010   Influenza,inj,Quad PF,6+ Mos 11/11/2012, 11/10/2013, 10/11/2014, 08/30/2015, 09/19/2016, 08/29/2017   Pneumococcal Conjugate-13 03/28/2014   Pneumococcal Polysaccharide-23 05/30/2010, 06/19/2015   Td 09/22/2003    Qualifies for Shingles Vaccine? In process  Screening Tests Health Maintenance  Topic Date Due   COLONOSCOPY  03/24/2019 (Originally 07/14/2016)   TETANUS/TDAP  03/24/2019 (Originally 09/21/2013)   HEMOGLOBIN A1C  05/12/2019   INFLUENZA VACCINE  Completed   Hepatitis C Screening  Completed   PNA vac Low Risk Adult  Completed   Cancer Screenings: Lung: Low Dose CT Chest recommended if Age 69-80 years, 30 pack-year currently smoking OR have quit w/in 15years. Patient does qualify. Colorectal: Due 2021  Additional Screenings:  Hepatitis C Screening: completed      Plan:      1. Encounter for Medicare annual wellness exam  I have personally reviewed and noted the following in the patients chart:    Medical and social history  Use of alcohol, tobacco or illicit drugs   Current medications and supplements  Functional ability and status  Nutritional status  Physical activity  Advanced directives  List of other physicians  Hospitalizations, surgeries, and ER visits in previous 12 months  Vitals  Screenings to include cognitive, depression, and falls  Referrals and appointments  In addition, I have reviewed and discussed with patient certain preventive protocols, quality metrics, and best practice recommendations. A written personalized care plan for preventive services as well as general preventive health recommendations were provided to patient.   I provided 20 minutes of non-face-to-face time during this encounter.   Perlie Mayo, NP  11/30/2018

## 2018-11-30 NOTE — Telephone Encounter (Signed)
Late entry. Spoke with patient on 11/27/18 and advised him a dose pack had been called in for him with verbal understanding.
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
## 2018-12-02 ENCOUNTER — Other Ambulatory Visit: Payer: Self-pay
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
## 2018-12-10 ENCOUNTER — Encounter (HOSPITAL_COMMUNITY): Payer: Self-pay | Admitting: *Deleted

## 2018-12-10 ENCOUNTER — Ambulatory Visit (INDEPENDENT_AMBULATORY_CARE_PROVIDER_SITE_OTHER): Payer: Medicare Other | Admitting: Family Medicine

## 2018-12-10 ENCOUNTER — Encounter: Payer: Self-pay | Admitting: Family Medicine

## 2018-12-10 ENCOUNTER — Other Ambulatory Visit: Payer: Self-pay

## 2018-12-10 ENCOUNTER — Emergency Department (HOSPITAL_COMMUNITY): Payer: Medicare Other

## 2018-12-10 ENCOUNTER — Inpatient Hospital Stay (HOSPITAL_COMMUNITY)
Admission: EM | Admit: 2018-12-10 | Discharge: 2018-12-13 | DRG: 193 | Disposition: A | Discharge status: Home / Self Care | Payer: Medicare Other | Attending: Internal Medicine | Admitting: Internal Medicine

## 2018-12-10 VITALS — BP 159/87 | HR 130 | Temp 97.7°F | Resp 28 | Ht 67.0 in | Wt 133.0 lb

## 2018-12-10 DIAGNOSIS — R0602 Shortness of breath: Secondary | ICD-10-CM

## 2018-12-10 DIAGNOSIS — J189 Pneumonia, unspecified organism: Secondary | ICD-10-CM

## 2018-12-10 DIAGNOSIS — J440 Chronic obstructive pulmonary disease with acute lower respiratory infection: Secondary | ICD-10-CM | POA: Diagnosis not present

## 2018-12-10 DIAGNOSIS — R7302 Impaired glucose tolerance (oral): Secondary | ICD-10-CM | POA: Diagnosis not present

## 2018-12-10 DIAGNOSIS — Z8673 Personal history of transient ischemic attack (TIA), and cerebral infarction without residual deficits: Secondary | ICD-10-CM | POA: Diagnosis not present

## 2018-12-10 DIAGNOSIS — Z87891 Personal history of nicotine dependence: Secondary | ICD-10-CM

## 2018-12-10 DIAGNOSIS — J9601 Acute respiratory failure with hypoxia: Secondary | ICD-10-CM | POA: Diagnosis not present

## 2018-12-10 DIAGNOSIS — J441 Chronic obstructive pulmonary disease with (acute) exacerbation: Secondary | ICD-10-CM

## 2018-12-10 DIAGNOSIS — J449 Chronic obstructive pulmonary disease, unspecified: Secondary | ICD-10-CM

## 2018-12-10 DIAGNOSIS — Z7982 Long term (current) use of aspirin: Secondary | ICD-10-CM | POA: Diagnosis not present

## 2018-12-10 DIAGNOSIS — I10 Essential (primary) hypertension: Secondary | ICD-10-CM

## 2018-12-10 DIAGNOSIS — Z20828 Contact with and (suspected) exposure to other viral communicable diseases: Secondary | ICD-10-CM | POA: Diagnosis present

## 2018-12-10 DIAGNOSIS — J181 Lobar pneumonia, unspecified organism: Secondary | ICD-10-CM

## 2018-12-10 DIAGNOSIS — R7981 Abnormal blood-gas level: Secondary | ICD-10-CM | POA: Diagnosis not present

## 2018-12-10 DIAGNOSIS — Z79899 Other long term (current) drug therapy: Secondary | ICD-10-CM

## 2018-12-10 DIAGNOSIS — E785 Hyperlipidemia, unspecified: Secondary | ICD-10-CM

## 2018-12-10 DIAGNOSIS — R000 Tachycardia, unspecified: Secondary | ICD-10-CM

## 2018-12-10 DIAGNOSIS — R0600 Dyspnea, unspecified: Secondary | ICD-10-CM | POA: Diagnosis present

## 2018-12-10 DIAGNOSIS — R0609 Other forms of dyspnea: Secondary | ICD-10-CM | POA: Diagnosis present

## 2018-12-10 HISTORY — DX: Pneumonia, unspecified organism: J18.9

## 2018-12-10 LAB — CBC WITH DIFFERENTIAL/PLATELET
Abs Immature Granulocytes: 0.04 10*3/uL (ref 0.00–0.07)
Basophils Absolute: 0.1 10*3/uL (ref 0.0–0.1)
Basophils Relative: 1 %
Eosinophils Absolute: 0.1 10*3/uL (ref 0.0–0.5)
Eosinophils Relative: 1 %
HCT: 45.1 % (ref 39.0–52.0)
Hemoglobin: 14.1 g/dL (ref 13.0–17.0)
Immature Granulocytes: 1 %
Lymphocytes Relative: 14 %
Lymphs Abs: 1.1 10*3/uL (ref 0.7–4.0)
MCH: 29.1 pg (ref 26.0–34.0)
MCHC: 31.3 g/dL (ref 30.0–36.0)
MCV: 93.2 fL (ref 80.0–100.0)
Monocytes Absolute: 0.9 10*3/uL (ref 0.1–1.0)
Monocytes Relative: 12 %
Neutro Abs: 5.6 10*3/uL (ref 1.7–7.7)
Neutrophils Relative %: 71 %
Platelets: 258 10*3/uL (ref 150–400)
RBC: 4.84 MIL/uL (ref 4.22–5.81)
RDW: 13.3 % (ref 11.5–15.5)
WBC: 7.8 10*3/uL (ref 4.0–10.5)
nRBC: 0 % (ref 0.0–0.2)

## 2018-12-10 LAB — POC SARS CORONAVIRUS 2 AG -  ED: SARS Coronavirus 2 Ag: NEGATIVE

## 2018-12-10 LAB — BASIC METABOLIC PANEL
Anion gap: 12 (ref 5–15)
BUN: 14 mg/dL (ref 8–23)
CO2: 27 mmol/L (ref 22–32)
Calcium: 9.2 mg/dL (ref 8.9–10.3)
Chloride: 97 mmol/L — ABNORMAL LOW (ref 98–111)
Creatinine, Ser: 0.94 mg/dL (ref 0.61–1.24)
GFR calc Af Amer: >60 mL/min (ref >60)
GFR calc non Af Amer: >60 mL/min (ref >60)
Glucose, Bld: 117 mg/dL — ABNORMAL HIGH (ref 70–99)
Potassium: 4 mmol/L (ref 3.5–5.1)
Sodium: 136 mmol/L (ref 135–145)

## 2018-12-10 LAB — BRAIN NATRIURETIC PEPTIDE: B Natriuretic Peptide: 18 pg/mL (ref 0.0–100.0)

## 2018-12-10 LAB — LACTIC ACID, PLASMA: Lactic Acid, Venous: 1.4 mmol/L (ref 0.5–1.9)

## 2018-12-10 LAB — TROPONIN I (HIGH SENSITIVITY)
Troponin I (High Sensitivity): 3 ng/L (ref <18)
Troponin I (High Sensitivity): 4 ng/L (ref <18)

## 2018-12-10 LAB — MAGNESIUM: Magnesium: 2 mg/dL (ref 1.7–2.4)

## 2018-12-10 MED ORDER — ALBUTEROL SULFATE HFA 108 (90 BASE) MCG/ACT IN AERS
4.0000 | INHALATION_SPRAY | Freq: Once | RESPIRATORY_TRACT | Status: AC
  Administered 2018-12-10: 4 via RESPIRATORY_TRACT
  Filled 2018-12-10: qty 6.7

## 2018-12-10 MED ORDER — MOMETASONE FURO-FORMOTEROL FUM 200-5 MCG/ACT IN AERO
2.0000 | INHALATION_SPRAY | Freq: Two times a day (BID) | RESPIRATORY_TRACT | Status: DC
  Filled 2018-12-10: qty 8.8

## 2018-12-10 MED ORDER — METHYLPREDNISOLONE SODIUM SUCC 125 MG IJ SOLR
125.0000 mg | Freq: Once | INTRAMUSCULAR | Status: AC
  Administered 2018-12-10: 125 mg via INTRAVENOUS
  Filled 2018-12-10: qty 2

## 2018-12-10 MED ORDER — IPRATROPIUM BROMIDE HFA 17 MCG/ACT IN AERS
1.0000 | INHALATION_SPRAY | Freq: Four times a day (QID) | RESPIRATORY_TRACT | Status: DC
  Filled 2018-12-10: qty 12.9

## 2018-12-10 MED ORDER — SODIUM CHLORIDE 0.9% FLUSH
3.0000 mL | Freq: Two times a day (BID) | INTRAVENOUS | Status: DC
  Administered 2018-12-10 – 2018-12-13 (×6): 3 mL via INTRAVENOUS

## 2018-12-10 MED ORDER — AEROCHAMBER PLUS FLO-VU MEDIUM MISC
1.0000 | Freq: Once | Status: AC
  Administered 2018-12-10: 1
  Filled 2018-12-10: qty 1

## 2018-12-10 MED ORDER — LEVALBUTEROL TARTRATE 45 MCG/ACT IN AERO
1.0000 | INHALATION_SPRAY | RESPIRATORY_TRACT | Status: DC | PRN
  Filled 2018-12-10: qty 15

## 2018-12-10 MED ORDER — ACETAMINOPHEN 650 MG RE SUPP: 650.0000 mg | Freq: Four times a day (QID) | RECTAL | Status: DC | PRN

## 2018-12-10 MED ORDER — PRAVASTATIN SODIUM 40 MG PO TABS
40.0000 mg | ORAL_TABLET | Freq: Every evening | ORAL | Status: DC
  Administered 2018-12-10 – 2018-12-12 (×3): 40 mg via ORAL
  Filled 2018-12-10 (×3): qty 1

## 2018-12-10 MED ORDER — SODIUM CHLORIDE 0.9 % IV SOLN
1.0000 g | INTRAVENOUS | Status: DC
  Administered 2018-12-11: 1 g via INTRAVENOUS
  Filled 2018-12-10: qty 10

## 2018-12-10 MED ORDER — ASPIRIN 81 MG PO CHEW
81.0000 mg | CHEWABLE_TABLET | Freq: Every day | ORAL | Status: DC
  Administered 2018-12-11 – 2018-12-13 (×3): 81 mg via ORAL
  Filled 2018-12-10 (×3): qty 1

## 2018-12-10 MED ORDER — SODIUM CHLORIDE 0.9 % IV SOLN
1.0000 g | Freq: Once | INTRAVENOUS | Status: AC
  Administered 2018-12-10: 1 g via INTRAVENOUS
  Filled 2018-12-10: qty 10

## 2018-12-10 MED ORDER — AZITHROMYCIN 250 MG PO TABS
500.0000 mg | ORAL_TABLET | Freq: Once | ORAL | Status: AC
  Administered 2018-12-10: 500 mg via ORAL
  Filled 2018-12-10: qty 2

## 2018-12-10 MED ORDER — SODIUM CHLORIDE 0.9 % IV SOLN
500.0000 mg | INTRAVENOUS | Status: DC
  Administered 2018-12-11: 500 mg via INTRAVENOUS
  Filled 2018-12-10: qty 500

## 2018-12-10 MED ORDER — ENOXAPARIN SODIUM 40 MG/0.4ML ~~LOC~~ SOLN
40.0000 mg | SUBCUTANEOUS | Status: DC
  Administered 2018-12-11 – 2018-12-13 (×3): 40 mg via SUBCUTANEOUS
  Filled 2018-12-10 (×3): qty 0.4

## 2018-12-10 MED ORDER — ALBUTEROL SULFATE HFA 108 (90 BASE) MCG/ACT IN AERS
2.0000 | INHALATION_SPRAY | Freq: Four times a day (QID) | RESPIRATORY_TRACT | Status: DC | PRN
  Filled 2018-12-10: qty 6.7

## 2018-12-10 MED ORDER — SODIUM CHLORIDE 0.9 % IV SOLN
INTRAVENOUS | Status: DC
  Administered 2018-12-10: 21:00:00 via INTRAVENOUS

## 2018-12-10 MED ORDER — ACETAMINOPHEN 325 MG PO TABS: 650.0000 mg | ORAL_TABLET | Freq: Four times a day (QID) | ORAL | Status: DC | PRN

## 2018-12-10 MED ORDER — LEVALBUTEROL TARTRATE 45 MCG/ACT IN AERO
1.0000 | INHALATION_SPRAY | Freq: Four times a day (QID) | RESPIRATORY_TRACT | Status: DC
  Filled 2018-12-10: qty 15

## 2018-12-10 MED ORDER — METHYLPREDNISOLONE SODIUM SUCC 40 MG IJ SOLR
40.0000 mg | Freq: Four times a day (QID) | INTRAMUSCULAR | Status: DC
  Administered 2018-12-10 – 2018-12-12 (×7): 40 mg via INTRAVENOUS
  Filled 2018-12-10 (×7): qty 1

## 2018-12-10 MED ORDER — IPRATROPIUM-ALBUTEROL 20-100 MCG/ACT IN AERS
1.0000 | INHALATION_SPRAY | Freq: Four times a day (QID) | RESPIRATORY_TRACT | Status: DC
  Filled 2018-12-10: qty 4

## 2018-12-10 NOTE — Progress Notes (Signed)
Text page sent to midlevel MD about patients elevated heart rate.

## 2018-12-10 NOTE — ED Notes (Signed)
ED TO INPATIENT HANDOFF REPORT  ED Nurse Name and Phone #: [phone]  S Name/Age/Gender [name] 68 y.o. male Room/Bed: APA07/APA07  Code Status   Code Status: Prior  Home/SNF/Other Home Patient oriented to: self, place, time and situation Is this baseline? Yes   Triage Complete: Triage complete  Chief Complaint COPD-SOB  Triage Note Pt with sob since last night, seen Dr. Moshe Cipro and sent pt here due to PNA.  Pt with COPD   Allergies No Known Allergies  Level of Care/Admitting Diagnosis ED Disposition    ED Disposition Condition Comment   Admit  Hospital Area: [facility] [[id number]]  Level of Care: Telemetry [5]  Covid Evaluation: Symptomatic Person Under Investigation (PUI)  Diagnosis: CAP (community acquired pneumonia) [125271]  Admitting Physician: Rhetta Mura [[id number]]  Attending Physician: Rhetta Mura [[id number]]  Estimated length of stay: past midnight tomorrow  Certification:: I certify this patient will need inpatient services for at least 2 midnights  PT Class (Do Not Modify): Inpatient [101]  PT Acc Code (Do Not Modify): Private [1]       B Medical/Surgery History Past Medical History:  Diagnosis Date  . Asthma   . COPD (chronic obstructive pulmonary disease) ([place])   . CVA (cerebral vascular accident) ([place]) 2008   with temporary vision loss   . Depression   . Elevated PSA 2014   no diagnosis pf prostate cancer in 07/2014  . History of substance abuse ([place]) 01/14/2011   Marijuana  . History of tobacco abuse 01/14/2011  . Hyperlipidemia   . Hypertension   . Marijuana abuse   . Nicotine addiction   . Prediabetes 2014   History reviewed. No pertinent surgical history.   A IV Location/Drains/Wounds Patient Lines/Drains/Airways Status   Active Line/Drains/Airways    Name:   Placement date:   Placement time:   Site:   Days:   Peripheral IV 12/10/18 Right Antecubital   12/10/18    1515    Antecubital   less than 1           Intake/Output Last 24 hours  Intake/Output Summary (Last 24 hours) at 12/10/2018 1845 Last data filed at 12/10/2018 1723 Gross per 24 hour  Intake 100 ml  Output -  Net 100 ml    Labs/Imaging Results for orders placed or performed during the hospital encounter of 12/10/18 (from the past 48 hour(s))  POC SARS Coronavirus 2 Ag-ED - Nasal Swab (BD Veritor Kit)     Status: None   Collection Time: 12/10/18  3:29 PM  Result Value Ref Range   SARS Coronavirus 2 Ag NEGATIVE NEGATIVE    Comment: (NOTE) SARS-CoV-2 antigen NOT DETECTED.  Negative results are presumptive.  Negative results do not preclude SARS-CoV-2 infection and should not be used as the sole basis for treatment or other patient management decisions, including infection  control decisions, particularly in the presence of clinical signs and  symptoms consistent with COVID-19, or in those who have been in contact with the virus.  Negative results must be combined with clinical observations, patient history, and epidemiological information. The expected result is Negative. Fact Sheet for Patients: PodPark.tn Fact Sheet for Healthcare Providers: GiftContent.is This test is not yet approved or cleared by the [place] FDA and  has been authorized for detection and/or diagnosis of SARS-CoV-2 by FDA under an Emergency Use Authorization (EUA).  This EUA will remain in effect (meaning this test can be used) for the duration of  the COVID-19  de claration under Section 564(b)(1) of the Act, 21 U.S.C. section 360bbb-3(b)(1), unless the authorization is terminated or revoked sooner.   Basic metabolic panel     Status: Abnormal   Collection Time: 12/10/18  3:30 PM  Result Value Ref Range   Sodium 136 135 - 145 mmol/L   Potassium 4.0 3.5 - 5.1 mmol/L   Chloride 97 (L) 98 - 111 mmol/L   CO2 27 22 - 32 mmol/L   Glucose, Bld 117 (H) 70 - 99 mg/dL   BUN 14 8 - 23 mg/dL    Creatinine, Ser 0.94 0.61 - 1.24 mg/dL   Calcium 9.2 8.9 - 10.3 mg/dL   GFR calc non Af Amer >60 >60 mL/min   GFR calc Af Amer >60 >60 mL/min   Anion gap 12 5 - 15    Comment: Performed at [facility], [address], [place], [place] [address]  Brain natriuretic peptide     Status: None   Collection Time: 12/10/18  3:30 PM  Result Value Ref Range   B Natriuretic Peptide 18.0 0.0 - 100.0 pg/mL    Comment: Performed at [facility], [address] [place], [place] [address]  CBC with Differential     Status: None   Collection Time: 12/10/18  3:30 PM  Result Value Ref Range   WBC 7.8 4.0 - 10.5 K/uL   RBC 4.84 4.22 - 5.81 MIL/uL   Hemoglobin 14.1 13.0 - 17.0 g/dL   HCT 45.1 39.0 - 52.0 %   MCV 93.2 80.0 - 100.0 fL   MCH 29.1 26.0 - 34.0 pg   MCHC 31.3 30.0 - 36.0 g/dL   RDW 13.3 11.5 - 15.5 %   Platelets 258 150 - 400 K/uL   nRBC 0.0 0.0 - 0.2 %   Neutrophils Relative % 71 %   Neutro Abs 5.6 1.7 - 7.7 K/uL   Lymphocytes Relative 14 %   Lymphs Abs 1.1 0.7 - 4.0 K/uL   Monocytes Relative 12 %   Monocytes Absolute 0.9 0.1 - 1.0 K/uL   Eosinophils Relative 1 %   Eosinophils Absolute 0.1 0.0 - 0.5 K/uL   Basophils Relative 1 %   Basophils Absolute 0.1 0.0 - 0.1 K/uL   Immature Granulocytes 1 %   Abs Immature Granulocytes 0.04 0.00 - 0.07 K/uL    Comment: Performed at [facility], [address], [place], [place] [address]  Troponin I (High Sensitivity)     Status: None   Collection Time: 12/10/18  3:30 PM  Result Value Ref Range   Troponin I (High Sensitivity) 3 <18 ng/L    Comment: (NOTE) Elevated high sensitivity troponin I (hsTnI) values and significant  changes across serial measurements may suggest ACS but many other  chronic and acute conditions are known to elevate hsTnI results.  Refer to the "Links" section for chest pain algorithms and additional  guidance. Performed at [facility], [address] [place], [place] [address]   Culture, blood (routine x 2)      Status: None (Preliminary result)   Collection Time: 12/10/18  3:58 PM   Specimen: Left Antecubital; Blood  Result Value Ref Range   Specimen Description LEFT ANTECUBITAL    Special Requests      BOTTLES DRAWN AEROBIC AND ANAEROBIC Blood Culture adequate volume Performed at [facility], [address] [place], [place] [address]    Culture PENDING    Report Status PENDING   Culture, blood (routine x 2)  Status: None (Preliminary result)   Collection Time: 12/10/18  3:59 PM   Specimen: BLOOD LEFT HAND  Result Value Ref Range   Specimen Description BLOOD LEFT HAND    Special Requests      BOTTLES DRAWN AEROBIC AND ANAEROBIC Blood Culture adequate volume Performed at [facility], [address] [place], [place] [address]    Culture PENDING    Report Status PENDING   Troponin I (High Sensitivity)     Status: None   Collection Time: 12/10/18  3:59 PM  Result Value Ref Range   Troponin I (High Sensitivity) 4 <18 ng/L    Comment: (NOTE) Elevated high sensitivity troponin I (hsTnI) values and significant  changes across serial measurements may suggest ACS but many other  chronic and acute conditions are known to elevate hsTnI results.  Refer to the "Links" section for chest pain algorithms and additional  guidance. Performed at [facility], [address] [place], [place] [address]   Lactic acid, plasma     Status: None   Collection Time: 12/10/18  4:00 PM  Result Value Ref Range   Lactic Acid, Venous 1.4 0.5 - 1.9 mmol/L    Comment: Performed at [facility], [address], [place], [place] [address]   Dg Chest Portable 1 View  Result Date: 12/10/2018 CLINICAL DATA:  Worsening shortness of breath since last night. EXAM: PORTABLE CHEST 1 VIEW COMPARISON:  04/18/2018 and older studies. FINDINGS: Hazy airspace opacity is noted in the left lower lung. Lungs are hyperexpanded but otherwise clear. Cardiac silhouette is normal in size. No mediastinal or hilar masses. No pleural  effusion or pneumothorax. Skeletal structures are grossly intact. IMPRESSION: 1. Hazy airspace opacity in the left lower lung consistent with pneumonia. No other evidence of acute cardiopulmonary disease. 2. Hyperexpanded lungs consistent with COPD. Electronically Signed   By: Lajean Manes M.D.   On: 12/10/2018 15:14    Pending Labs Unresulted Labs (From admission, onward)    Start     Ordered   12/10/18 1553  SARS CORONAVIRUS 2 (TAT 6-24 HRS) Nasopharyngeal Nasopharyngeal Swab  (Asymptomatic/Tier 3)  Once,   STAT    Question Answer Comment  Is this test for diagnosis or screening Screening   Symptomatic for COVID-19 as defined by CDC No   Hospitalized for COVID-19 No   Admitted to ICU for COVID-19 No   Previously tested for COVID-19 Yes   Resident in a congregate (group) care setting No   Employed in healthcare setting No      12/10/18 1552          Vitals/Pain Today's Vitals   12/10/18 1700 12/10/18 1730 12/10/18 1800 12/10/18 1830  BP: 140/85 (!) 142/101 (!) 157/87 124/76  Pulse: (!) 116 (!) 118 (!) 110 90  Resp: (!) 24 20 (!) 25 (!) 23  Temp:      TempSrc:      SpO2: 100% 98% 98% 95%  Weight:      Height:      PainSc:        Isolation Precautions Airborne and Contact precautions  Medications Medications  methylPREDNISolone sodium succinate (SOLU-MEDROL) 125 mg/2 mL injection 125 mg (125 mg Intravenous Given 12/10/18 1527)  albuterol (VENTOLIN HFA) 108 (90 Base) MCG/ACT inhaler 4 puff (4 puffs Inhalation Given 12/10/18 1550)  AeroChamber Plus Flo-Vu Medium MISC 1 each (1 each Other Given 12/10/18 1550)  cefTRIAXone (ROCEPHIN) 1 g in sodium chloride 0.9 % 100 mL IVPB (0 g Intravenous Stopped 12/10/18 1723)  azithromycin (ZITHROMAX) tablet 500 mg (500 mg Oral Given 12/10/18 1644)    Mobility walks Low fall risk   Focused Assessments    R Recommendations: See Admitting Provider Note  Report given to:   Additional Notes:

## 2018-12-10 NOTE — H&P (Signed)
History and Physical    PLEASE NOTE THAT DRAGON DICT[employer] SOFTWARE WAS USED IN THE CONSTRUCTION OF THIS NOTE.   [name] LTJ:[id number] DOB: 06/24/1950 DOA: 12/10/2018  PCP: Fayrene Helper, MD Patient coming from: Home  I have personally briefly reviewed patient's old medical records in [place]  Chief Complaint: Shortness of breath  HPI: [name] is a 68 y.o. male with medical history significant for COPD, hypertension, hyperlipidemia, who is admitted to [facility] on 12/10/2018 with community-acquired pneumonia after resenting from home to [facility] emergency department complaining of shortness of breath.  The patient reports 1 to 2 days of progressive shortness of breath associated with new onset nonproductive cough as well as new wheezing over that timeframe.  Denies any associated orthopnea, PND, or peripheral edema.  Denies any associated chest pain, palpitations, diaphoresis, nausea, vomiting.  He also denies any associated subjective fever, chills, rigors, or generalized myalgias.  Denies any recent headache, neck stiffness, rhinitis, sore throat, abdominal pain, diarrhea, or rash.  Denies any recent traveling, and denies any known Covid exposures.  He reports that he lives at home by himself, and reports that he does not frequently venture outside of his house.  Over the last 1 to 2 days, the patient also reports diminished appetite, resulting in a slight decline in his oral intake of both food and fluid over that time.  He reports good compliance with his outpatient respiratory regimen, and confirms that he completely quit smoking 3 to 4 years ago, without any subsequent resumption.   In the setting of progressive shortness of breath, the patient presented to his PCPs office earlier today, at which time his oxygen saturations were noted to be in the high 80s on room air relative to no baseline supplemental oxygen requirements.  In light of this finding,  PCP instructed the patient to present to the emergency department for further evaluation.   ED Course: Vital signs in the emergency department were notable for the following: Temperature max 98.3; initial heart rate in the 130s, which is improved to the range of 90-1 10 after the interval administration of IV steroids, antibiotics, and breathing treatment, blood pressure has ranged from 124/76-150 7/87; respiratory rate 20-25; Initial oxygen saturation was 88% on room air, which improved to 92 to 96% on 2 L nasal cannula.   Labs in the ED were notable for the following: CMP notable for sodium 136, bicarbonate 27, creatinine 0.94 relative to most recent prior creatinine data point of 0.97 on 11/12/2018.  BNP 18; high-sensitivity troponin I x2 were found to be 3 and 4, respectively.  CBC notable for white blood cell count of 7800 with 71% neutrophils.  Lactic acid 1.4.  Rapid Covid antigen screen was found to be negative, while nasopharyngeal COVID-19 PCR is currently pending.  Blood cultures x2 were collected prior to initiation of any antibiotics.  Chest x-ray showed hazy airspace opacity in the left lower lobe consistent with pneumonia, in the absence of any evidence of pulmonary edema, pleural effusion, or pneumothorax.  Interpretation of presenting EKG was somewhat limited by the presence of artifact, but was felt to demonstrate sinus tachycardia with heart rate 132, nonspecific T wave flattening in V1 as well as Q waves in V2, both of which were also noted to be present on most recent prior EKG from 04/20/2018; presenting EKG also showed no evidence of ST changes.   While in the ED, the following were administered: Albuterol inhaler, azithromycin  500 mg p.o. x1, Rocephin 1 g IV x1, and Solu-Medrol 125 mg IV x1.    Review of Systems: As per HPI otherwise 10 point review of systems negative.   Past Medical History:  Diagnosis Date  . Asthma   . COPD (chronic obstructive pulmonary disease) ([place])    . CVA (cerebral vascular accident) ([place]) 2008   with temporary vision loss   . Depression   . Elevated PSA 2014   no diagnosis pf prostate cancer in 07/2014  . History of substance abuse ([place]) 01/14/2011   Marijuana  . History of tobacco abuse 01/14/2011  . Hyperlipidemia   . Hypertension   . Marijuana abuse   . Nicotine addiction   . Prediabetes 2014    History reviewed. No pertinent surgical history.  Social History:  reports that he quit smoking about 3 years ago. His smoking use included cigarettes. He has a 20.00 pack-year smoking history. He has never used smokeless tobacco. He reports current alcohol use of about 3.0 standard drinks of alcohol per week. He reports that he does not use drugs.   No Known Allergies  History reviewed. No pertinent family history. Family history reviewed and not pertinent    Prior to Admission medications   Medication Sig Start Date End Date Taking? Authorizing Provider  albuterol (PROVENTIL) (2.5 MG/3ML) 0.083% nebulizer solution TAKE 1 AMPULE (3 MLS) BY NEBULIZATION AS DIRECTED EVERY 4 HOURS AS NEEDED. 10/28/18   Fayrene Helper, MD  aspirin 81 MG tablet Take 81 mg by mouth daily.    [provider]  Fluticasone-Salmeterol (ADVAIR DISKUS) 500-50 MCG/DOSE AEPB Inhale 1 puff into the lungs 2 (two) times daily. INHALE 1 PUFF BY MOUTH TWICE A DAY. RINSE MOUTH AFTER USE. 10/20/18   Perlie Mayo, NP  ipratropium (ATROVENT) 0.02 % nebulizer solution Take 2.5 mLs (0.5 mg total) by nebulization every 6 (six) hours as needed for wheezing or shortness of breath. 10/20/18   Perlie Mayo, NP  Ipratropium-Albuterol (COMBIVENT) 20-100 MCG/ACT AERS respimat One puff into lungs every 6 hours 10/20/18   Perlie Mayo, NP  omeprazole (PRILOSEC) 40 MG capsule Take 1 capsule (40 mg total) by mouth daily. 09/22/18   Fayrene Helper, MD  pravastatin (PRAVACHOL) 40 MG tablet TAKE (1) TABLET BY MOUTH AT BEDTIME. 06/12/18   Fayrene Helper, MD   PROAIR HFA 108 [phone] Base) MCG/ACT inhaler Inhale 2 puffs into the lungs every 6 (six) hours as needed for wheezing or shortness of breath. 10/20/18   Perlie Mayo, NP  triamterene-hydrochlorothiazide (MAXZIDE-25) 37.5-25 MG tablet TAKE 1 & 1/2 TABLETS BY MOUTH ONCE DAILY. 08/27/18   Fayrene Helper, MD  simvastatin (ZOCOR) 40 MG tablet Take 40 mg by mouth daily. 10/02/10 03/18/11  Fayrene Helper, MD     Objective    Physical Exam: Vitals:   12/10/18 1530 12/10/18 1600 12/10/18 1630 12/10/18 1700  BP: (!) 142/82 (!) 144/84 132/84 140/85  Pulse: (!) 118 (!) 120 (!) 117 (!) 116  Resp: (!) 25 (!) 22 (!) 25 (!) 24  Temp:      TempSrc:      SpO2: 100% 99% 98% 100%  Weight:      Height:        General: appears to be stated age; alert, oriented; slightly increased wob;  Skin: warm, dry, no rash Head:  AT/[place] Eyes:  PEARL b/l, EOMI Mouth:  Oral mucosa membranes appear dry, normal dentition Neck: supple; trachea midline Heart:  mildy tachycardic, but regular; did not appreciate any M/R/G Lungs: Left basilar rales noted, but otherwise CTAB, did not appreciate any wheezes or rhonchi Abdomen: + BS; soft, ND, NT Vascular: 2+ pedal pulses b/l; 2+ radial pulses b/l Extremities: no peripheral edema, no muscle wasting   Labs on Admission: I have personally reviewed following labs and imaging studies  CBC: Recent Labs  Lab 12/10/18 1530  WBC 7.8  NEUTROABS 5.6  HGB 14.1  HCT 45.1  MCV 93.2  PLT 914   Basic Metabolic Panel: Recent Labs  Lab 12/10/18 1530  NA 136  K 4.0  CL 97*  CO2 27  GLUCOSE 117*  BUN 14  CREATININE 0.94  CALCIUM 9.2   GFR: Estimated Creatinine Clearance: 64.7 mL/min (by C-G formula based on SCr of 0.94 mg/dL). Liver Function Tests: No results for input(s): AST, ALT, ALKPHOS, BILITOT, PROT, ALBUMIN in the last 168 hours. No results for input(s): LIPASE, AMYLASE in the last 168 hours. No results for input(s): AMMONIA in the last 168  hours. Coagulation Profile: No results for input(s): INR, PROTIME in the last 168 hours. Cardiac Enzymes: No results for input(s): CKTOTAL, CKMB, CKMBINDEX, TROPONINI in the last 168 hours. BNP (last 3 results) No results for input(s): PROBNP in the last 8760 hours. HbA1C: No results for input(s): HGBA1C in the last 72 hours. CBG: No results for input(s): GLUCAP in the last 168 hours. Lipid Profile: No results for input(s): CHOL, HDL, LDLCALC, TRIG, CHOLHDL, LDLDIRECT in the last 72 hours. Thyroid Function Tests: No results for input(s): TSH, T4TOTAL, FREET4, T3FREE, THYROIDAB in the last 72 hours. Anemia Panel: No results for input(s): VITAMINB12, FOLATE, FERRITIN, TIBC, IRON, RETICCTPCT in the last 72 hours. Urine analysis:    Component Value Date/Time   COLORURINE YELLOW 02/17/2011 2236   APPEARANCEUR CLEAR 02/17/2011 2236   LABSPEC >1.030 (H) 02/17/2011 2236   PHURINE 5.5 02/17/2011 2236   GLUCOSEU NEGATIVE 02/17/2011 2236   GLUCOSEU 100 (A) 07/30/2006 0507   HGBUR TRACE (A) 02/17/2011 2236   BILIRUBINUR NEGATIVE 02/17/2011 2236   KETONESUR NEGATIVE 02/17/2011 2236   PROTEINUR NEGATIVE 02/17/2011 2236   UROBILINOGEN 0.2 02/17/2011 2236   NITRITE NEGATIVE 02/17/2011 2236   LEUKOCYTESUR NEGATIVE 02/17/2011 2236    Radiological Exams on Admission: Dg Chest Portable 1 View  Result Date: 12/10/2018 CLINICAL DATA:  Worsening shortness of breath since last night. EXAM: PORTABLE CHEST 1 VIEW COMPARISON:  04/18/2018 and older studies. FINDINGS: Hazy airspace opacity is noted in the left lower lung. Lungs are hyperexpanded but otherwise clear. Cardiac silhouette is normal in size. No mediastinal or hilar masses. No pleural effusion or pneumothorax. Skeletal structures are grossly intact. IMPRESSION: 1. Hazy airspace opacity in the left lower lung consistent with pneumonia. No other evidence of acute cardiopulmonary disease. 2. Hyperexpanded lungs consistent with COPD. Electronically  Signed   By: Lajean Manes M.D.   On: 12/10/2018 15:14    EKG: Independently reviewed, with results as described above.   Assessment/Plan  [name] is a 68 y.o. male with medical history significant for COPD, hypertension, hyperlipidemia, who is admitted to [facility] on 12/10/2018 with community-acquired pneumonia after resenting from home to [facility] emergency department complaining of shortness of breath.  Principal Problem:   CAP (community acquired pneumonia) Active Problems:   Hyperlipidemia   Essential hypertension   Shortness of breath   COPD with acute exacerbation (HCC)  #) Community-acquired pneumonia: Diagnosis on the basis of 1 to 2 days of progressive  shortness of breath associated with new onset nonproductive cough, with evidence of increased work of breathing, mild hypoxia, while chest x-ray showed interval development of hazy airspace opacity in the left lower lobe consistent with pneumonia.  While the patient is mildly tachycardic and tachypneic, he does not demonstrate an objective fever or leukocytosis, and therefore SIRS criteria are not met in order for the patient to be considered septic at this time.  Presenting lactic acid nonelevated at 1.4, while the patient has not demonstrated any evidence of hypotension.  The patient denies any known COVID-19 exposure, and rapid Covid antigen was found to be negative, with Covid PCR currently pending.  While in the emergency department, blood cultures x2 were collected followed by administration of Rocephin and azithromycin for community-acquired pneumonia coverage.  Plan: Monitor for results of blood cultures x2.  Continue Rocephin and azithromycin.  Repeat CBC with differential in the morning.  Check strep pneumonia urine antigen.  Will follow for results of nasopharyngeal COVID-19 PCR.  Monitor continuous pulse oximetry.  As needed supplemental oxygen order to maintain oxygen saturation greater than or equal to 92%.   Additional work-up and management of associated acute COPD exacerbation and acute hypoxic respiratory distress, as further described below.  As needed albuterol inhaler.    #) Acute COPD exacerbation: Diagnosis on the basis of 1 to 2 days of aggressive shortness of breath associated with new onset wheezing, increased work of breathing, and acute hypoxia, with presenting oxygen saturations into the high 80s on room air relative no baseline supplemental oxygen requirements.  Of note, ensuing oxygen saturations have improved into the low to mid 90s on 2 L nasal cannula.  In the setting of a known history of COPD, this acute exacerbation appears to be on the basis of the previously described community-acquired pneumonia.  The patient reports good compliance with his outpatient respiratory regimen, and confirms that he completely quit smoking 3 to 4 years ago, without any subsequent resumption.   Plan: Continue home Advair and scheduled Combivent inhaler every 6 hours while awake.  As needed albuterol inhaler for shortness of breath or wheezing.  Solu-Medrol 40 mg IV every 6 hours.  Work-up and management of associated community-acquired pneumonia, as above.  Monitor on continuous pulse oximetry.  Check ABG.  Will attempt to perform additional chart review to evaluate most recent pulmonary function test results.     #) Acute hypoxic respirator distress: Presenting oxygen saturations in the high 80s on room air relative no baseline supplemental oxygen requirements, which has subsequently improved into the range of 90 to 96% on 2 L nasal cannula.  This appears to be the basis of left lower lobe community-acquired pneumonia and resultant acute COPD exacerbation, as further described above.  ACS is felt to be less likely in the absence of any associated chest pain, while presenting EKG shows no evidence of acute ischemic changes, and high-sensitivity troponin I x2 have been nonelevated.  Clinically, pulmonary  embolism is felt to be less likely as well.  Aside from left lower lobe airspace opacity, presenting chest x-ray showed no evidence of acute cardiopulmonary process, including no evidence of pulmonary edema, pleural effusion, pneumothorax.  Plan: Work-up and management of presenting commune acquired pneumonia as well as resultant acute COPD exacerbation, as further described above, including resumption of outpatient respiratory regimen and as needed albuterol inhaler.  Solu-Medrol as above.  Monitor on continuous pulse oximetry, with as needed supplemental oxygen order to maintain oxygen saturations greater than or equal  to 92%.  Will monitor for result of nasopharyngeal COVID-19 PCR performed this evening.     #) Essential hypertension: On triamterene and HCTZ as his outpatient antihypertensive medications.  Systolic blood pressures have been in the range of the 120s to 150s mmHg while in the emergency department this evening.  In the setting of infectious presentation, will hold home blood pressure medications for now.  Plan: Hold outpatient antihypertensive medications for now in the setting of infectious presentation.  Close monitoring of ensuing blood pressure via routine vital signs.     #) Hyperlipidemia: On pravastatin as an outpatient.  Plan: Continue home statin.   DVT prophylaxis: Lovenox 10 mg subcu daily Code Status: Full code Family Communication: None Disposition Plan:  Per Rounding Team Consults called: None Admission status: Inpatient; med telemetry   PLEASE NOTE THAT DRAGON DICTATION SOFTWARE WAS USED IN THE CONSTRUCTION OF THIS NOTE.   [facility] Pager [phone] From 3PM- 11PM.   Otherwise, please contact night-coverage  [email or web address] Password [facility]  12/10/2018, 5:49 PM

## 2018-12-10 NOTE — ED Provider Notes (Signed)
[facility] EMERGENCY DEPARTMENT Provider Note   CSN: [id number] Arrival date & time: 12/10/18  1434     History   Chief Complaint Chief Complaint  Patient presents with  . Shortness of Breath    hx COPD    HPI YU DEARMENT is a 68 y.o. male.     Pt presents to the ED today with sob.  The pt said his sob started yesterday.  He does have a hx of COPD and has used all of his COPD meds without improvement.  Last steroid use was at the end of November.   Pt initially went to his pcp's office.  Walking in from his car, his O2 sats dropped to 88%.  The pt said he's not been eating or drinking much.  He has not had any known covid contacts.  He lives alone and does not go out much.  No f/c.       Past Medical History:  Diagnosis Date  . Asthma   . COPD (chronic obstructive pulmonary disease) ([place])   . CVA (cerebral vascular accident) ([place]) 2008   with temporary vision loss   . Depression   . Elevated PSA 2014   no diagnosis pf prostate cancer in 07/2014  . History of substance abuse ([place]) 01/14/2011   Marijuana  . History of tobacco abuse 01/14/2011  . Hyperlipidemia   . Hypertension   . Marijuana abuse   . Nicotine addiction   . Prediabetes 2014    Patient Active Problem List   Diagnosis Date Noted  . Food insecurity 11/22/2018  . Glucose intolerance 04/18/2018  . Epidermal cyst of face 01/21/2017  . Annual physical exam 11/10/2013  . Social isolation 03/17/2013  . Asthma with COPD (chronic obstructive pulmonary disease) ([place]) 11/12/2012  . History of substance abuse ([place]) 01/14/2011  . ECHOCARDIOGRAM, ABNORMAL 03/06/2010  . Diabetes mellitus type 2, controlled ([place]) 10/26/2009  . Vitamin D deficiency 06/28/2009  . IGT (impaired glucose tolerance) 01/25/2009  . PSA, INCREASED 01/25/2009  . Hyperlipidemia 07/30/2007  . Essential hypertension 07/30/2007    History reviewed. No pertinent surgical history.      Home Medications    Prior to Admission  medications   Medication Sig Start Date End Date Taking? Authorizing Provider  albuterol (PROVENTIL) (2.5 MG/3ML) 0.083% nebulizer solution TAKE 1 AMPULE (3 MLS) BY NEBULIZATION AS DIRECTED EVERY 4 HOURS AS NEEDED. 10/28/18   Fayrene Helper, MD  aspirin 81 MG tablet Take 81 mg by mouth daily.    [provider]  Fluticasone-Salmeterol (ADVAIR DISKUS) 500-50 MCG/DOSE AEPB Inhale 1 puff into the lungs 2 (two) times daily. INHALE 1 PUFF BY MOUTH TWICE A DAY. RINSE MOUTH AFTER USE. 10/20/18   Perlie Mayo, NP  ipratropium (ATROVENT) 0.02 % nebulizer solution Take 2.5 mLs (0.5 mg total) by nebulization every 6 (six) hours as needed for wheezing or shortness of breath. 10/20/18   Perlie Mayo, NP  Ipratropium-Albuterol (COMBIVENT) 20-100 MCG/ACT AERS respimat One puff into lungs every 6 hours 10/20/18   Perlie Mayo, NP  omeprazole (PRILOSEC) 40 MG capsule Take 1 capsule (40 mg total) by mouth daily. 09/22/18   Fayrene Helper, MD  pravastatin (PRAVACHOL) 40 MG tablet TAKE (1) TABLET BY MOUTH AT BEDTIME. 06/12/18   Fayrene Helper, MD  PROAIR HFA 108 [phone] Base) MCG/ACT inhaler Inhale 2 puffs into the lungs every 6 (six) hours as needed for wheezing or shortness of breath. 10/20/18   Perlie Mayo,  NP  triamterene-hydrochlorothiazide (MAXZIDE-25) 37.5-25 MG tablet TAKE 1 & 1/2 TABLETS BY MOUTH ONCE DAILY. 08/27/18   Fayrene Helper, MD  simvastatin (ZOCOR) 40 MG tablet Take 40 mg by mouth daily. 10/02/10 03/18/11  Fayrene Helper, MD    Family History History reviewed. No pertinent family history.  Social History Social History   Tobacco Use  . Smoking status: Former Smoker    Packs/day: 0.50    Years: 40.00    Pack years: 20.00    Types: Cigarettes    Quit date: 04/14/2015    Years since quitting: 3.6  . Smokeless tobacco: Never Used  Substance Use Topics  . Alcohol use: Yes    Alcohol/week: 3.0 standard drinks    Types: 3 Cans of beer per week    Comment:  occasionally  . Drug use: No    Comment: pt quit 20 years ago     Allergies   Patient has no known allergies.   Review of Systems Review of Systems  Respiratory: Positive for shortness of breath.   All other systems reviewed and are negative.    Physical Exam Updated Vital Signs BP 140/85   Pulse (!) 116   Temp 98.3 F (36.8 C) (Oral)   Resp (!) 24   Ht 5\' 7"  (1.702 m)   Wt 62.6 kg   SpO2 100%   BMI 21.61 kg/m   Physical Exam Vitals signs and nursing note reviewed.  Constitutional:      Appearance: He is well-developed.  HENT:     Head: Normocephalic and atraumatic.     Mouth/Throat:     Mouth: Mucous membranes are dry.     Pharynx: Oropharynx is clear.  Eyes:     Extraocular Movements: Extraocular movements intact.     Pupils: Pupils are equal, round, and reactive to light.  Neck:     Musculoskeletal: Normal range of motion and neck supple.  Cardiovascular:     Rate and Rhythm: Regular rhythm. Tachycardia present.  Pulmonary:     Effort: Tachypnea present.     Breath sounds: Wheezing present.  Abdominal:     General: Bowel sounds are normal.     Palpations: Abdomen is soft.  Musculoskeletal: Normal range of motion.  Skin:    General: Skin is warm.     Capillary Refill: Capillary refill takes less than 2 seconds.  Neurological:     General: No focal deficit present.     Mental Status: He is alert and oriented to person, place, and time.  Psychiatric:        Mood and Affect: Mood normal.        Behavior: Behavior normal.      ED Treatments / Results  Labs (all labs ordered are listed, but only abnormal results are displayed) Labs Reviewed  BASIC METABOLIC PANEL - Abnormal; Notable for the following components:      Result Value   Chloride 97 (*)    Glucose, Bld 117 (*)    All other components within normal limits  CULTURE, BLOOD (ROUTINE X 2)  CULTURE, BLOOD (ROUTINE X 2)  SARS CORONAVIRUS 2 (TAT 6-24 HRS)  BRAIN NATRIURETIC PEPTIDE  CBC  WITH DIFFERENTIAL/PLATELET  LACTIC ACID, PLASMA  POC SARS CORONAVIRUS 2 AG -  ED  TROPONIN I (HIGH SENSITIVITY)  TROPONIN I (HIGH SENSITIVITY)    EKG EKG Interpretation  Date/Time:  Thursday December 10 2018 15:00:34 EST Ventricular Rate:  132 PR Interval:    QRS Duration: 99 QT  Interval:  304 QTC Calculation: 451 R Axis:   76 Text Interpretation: Sinus tachycardia Consider right atrial enlargement Inferior infarct, acute (LCx) Probable anteroseptal infarct, old Baseline wander in lead(s) II aVR aVF No significant change since last tracing Confirmed by Isla Pence [phone]) on 12/10/2018 4:21:56 PM   Radiology Dg Chest Portable 1 View  Result Date: 12/10/2018 CLINICAL DATA:  Worsening shortness of breath since last night. EXAM: PORTABLE CHEST 1 VIEW COMPARISON:  04/18/2018 and older studies. FINDINGS: Hazy airspace opacity is noted in the left lower lung. Lungs are hyperexpanded but otherwise clear. Cardiac silhouette is normal in size. No mediastinal or hilar masses. No pleural effusion or pneumothorax. Skeletal structures are grossly intact. IMPRESSION: 1. Hazy airspace opacity in the left lower lung consistent with pneumonia. No other evidence of acute cardiopulmonary disease. 2. Hyperexpanded lungs consistent with COPD. Electronically Signed   By: Lajean Manes M.D.   On: 12/10/2018 15:14    Procedures Procedures (including critical care time)  Medications Ordered in ED Medications  methylPREDNISolone sodium succinate (SOLU-MEDROL) 125 mg/2 mL injection 125 mg (125 mg Intravenous Given 12/10/18 1527)  albuterol (VENTOLIN HFA) 108 (90 Base) MCG/ACT inhaler 4 puff (4 puffs Inhalation Given 12/10/18 1550)  AeroChamber Plus Flo-Vu Medium MISC 1 each (1 each Other Given 12/10/18 1550)  cefTRIAXone (ROCEPHIN) 1 g in sodium chloride 0.9 % 100 mL IVPB (0 g Intravenous Stopped 12/10/18 1723)  azithromycin (ZITHROMAX) tablet 500 mg (500 mg Oral Given 12/10/18 1644)     Initial Impression  / Assessment and Plan / ED Course  I have reviewed the triage vital signs and the nursing notes.  Pertinent labs & imaging results that were available during my care of the patient were reviewed by me and considered in my medical decision making (see chart for details).   Pt placed on 2L oxygen to help with the work of breathing.  Covid Ag test negative, 6 hr Covid test is pending.  Pt is feeling a little better after steroids and albuterol.   Pt given rocephin and zithromax for pna.  Pt d/w Dr. Velia Meyer (triad) for admission.  BARTH REPP was evaluated in Emergency Department on 12/10/2018 for the symptoms described in the history of present illness. He was evaluated in the context of the global COVID-19 pandemic, which necessitated consideration that the patient might be at risk for infection with the SARS-CoV-2 virus that causes COVID-19. Institutional protocols and algorithms that pertain to the evaluation of patients at risk for COVID-19 are in a state of rapid change based on information released by regulatory bodies including the CDC and federal and state organizations. These policies and algorithms were followed during the patient's care in the ED.  Final Clinical Impressions(s) / ED Diagnoses   Final diagnoses:  COPD exacerbation ([place])  Community acquired pneumonia of left lower lobe of lung    ED Discharge Orders    None       Isla Pence, MD 12/10/18 1740

## 2018-12-10 NOTE — Patient Instructions (Addendum)
  I appreciate the opportunity to provide you with care for your health and wellness. Today we discussed: breathing trouble  Follow up: 03/23/2019  No labs or referrals today  We have discussed going to the hospital now.  I hope you have a wonderful, happy, safe, and healthy Holiday Season!  Please continue to practice social distancing to keep you, your family, and our community safe.  If you must go out, please wear a mask and practice good handwashing.  It was a pleasure to see you and I look forward to continuing to work together on your health and well-being. Please do not hesitate to call the office if you need care or have questions about your care.  Have a wonderful day and week. With Gratitude, [name], DNP, AGNP-BC

## 2018-12-10 NOTE — ED Triage Notes (Signed)
Pt with sob since last night, seen Dr. Moshe Cipro and sent pt here due to PNA.  Pt with COPD

## 2018-12-10 NOTE — Progress Notes (Signed)
Subjective:     Patient ID: [name], male   DOB: 18-Jul-1950, 67 y.o.   [name]: [id number]  [name] presents for Shortness of Breath [name] is a 68 year old male patient of Dr. Griffin Dakin who is well-known to the clinic.  Has significant history of asthma, COPD, depression, substance abuse, history of tobacco use hyperlipidemia hypertension among others.  Reports today after having onset of left rib pain last night and difficulty breathing with taking deep breaths and feeling short of breath.  He denies fevers, chills, body aches or any exposure to Covid at this time.  Denies any recent contacts with similar symptoms and or signs or symptoms of infection.  Reports that he does have a productive cough when he can get something up and it is green in nature and description.  Reports he has been using his inhalers and his nebulizers without relief of shortness of breath and difficulty breathing.  Reports that he feels like there is something wrong than his normal shortness of breath.  Recent use of prednisone Dosepak by Dr. Moshe Cipro at the end of November for difficulty with breathing.  Today patient denies signs and symptoms of COVID 19 infection including fever, chills, cough, shortness of breath, and headache.  Past Medical, Surgical, Social History, Allergies, and Medications have been Reviewed.   Past Medical History:  Diagnosis Date  . Asthma   . COPD (chronic obstructive pulmonary disease) ([place])   . CVA (cerebral vascular accident) ([place]) 2008   with temporary vision loss   . Depression   . Elevated PSA 2014   no diagnosis pf prostate cancer in 07/2014  . History of substance abuse ([place]) 01/14/2011   Marijuana  . History of tobacco abuse 01/14/2011  . Hyperlipidemia   . Hypertension   . Marijuana abuse   . Nicotine addiction   . Prediabetes 2014   History reviewed. No pertinent surgical history. Social History   Socioeconomic History  . Marital status: Widowed   Spouse name: Not on file  . Number of children: 1  . Years of education: Not on file  . Highest education level: Not on file  Occupational History  . Occupation: unemployed   Social Needs  . Financial resource strain: Patient refused  . Food insecurity    Worry: Patient refused    Inability: Patient refused  . Transportation needs    Medical: Patient refused    Non-medical: Patient refused  Tobacco Use  . Smoking status: Former Smoker    Packs/day: 0.50    Years: 40.00    Pack years: 20.00    Types: Cigarettes    Quit date: 04/14/2015    Years since quitting: 3.6  . Smokeless tobacco: Never Used  Substance and Sexual Activity  . Alcohol use: Yes    Alcohol/week: 3.0 standard drinks    Types: 3 Cans of beer per week    Comment: occasionally  . Drug use: No    Comment: pt quit 20 years ago  . Sexual activity: Not Currently  Lifestyle  . Physical activity    Days per week: Patient refused    Minutes per session: Patient refused  . Stress: Patient refused  Relationships  . Social Herbalist on phone: Patient refused    Gets together: Patient refused    Attends religious service: Patient refused    Active member of club or organization: Patient refused    Attends meetings of clubs or organizations: Patient refused  Relationship status: Patient refused  . Intimate partner violence    Fear of current or ex partner: Patient refused    Emotionally abused: Patient refused    Physically abused: Patient refused    Forced sexual activity: Patient refused  Other Topics Concern  . Not on file  Social History Narrative  . Not on file    Outpatient Encounter Medications as of 12/10/2018  Medication Sig  . albuterol (PROVENTIL) (2.5 MG/3ML) 0.083% nebulizer solution TAKE 1 AMPULE (3 MLS) BY NEBULIZATION AS DIRECTED EVERY 4 HOURS AS NEEDED.  [name] Kitchen aspirin 81 MG tablet Take 81 mg by mouth daily.  . Fluticasone-Salmeterol (ADVAIR DISKUS) 500-50 MCG/DOSE AEPB Inhale 1 puff  into the lungs 2 (two) times daily. INHALE 1 PUFF BY MOUTH TWICE A DAY. RINSE MOUTH AFTER USE.  [name] Kitchen ipratropium (ATROVENT) 0.02 % nebulizer solution Take 2.5 mLs (0.5 mg total) by nebulization every 6 (six) hours as needed for wheezing or shortness of breath.  . Ipratropium-Albuterol (COMBIVENT) 20-100 MCG/ACT AERS respimat One puff into lungs every 6 hours  . omeprazole (PRILOSEC) 40 MG capsule Take 1 capsule (40 mg total) by mouth daily.  . pravastatin (PRAVACHOL) 40 MG tablet TAKE (1) TABLET BY MOUTH AT BEDTIME.  [name] Kitchen PROAIR HFA 108 (90 Base) MCG/ACT inhaler Inhale 2 puffs into the lungs every 6 (six) hours as needed for wheezing or shortness of breath.  . triamterene-hydrochlorothiazide (MAXZIDE-25) 37.5-25 MG tablet TAKE 1 & 1/2 TABLETS BY MOUTH ONCE DAILY.  . [DISCONTINUED] predniSONE (STERAPRED UNI-PAK 21 TAB) 5 MG (21) TBPK tablet Take 1 tablet (5 mg total) by mouth as directed. Use as directed  . [DISCONTINUED] simvastatin (ZOCOR) 40 MG tablet Take 40 mg by mouth daily.   No facility-administered encounter medications on file as of 12/10/2018.    No Known Allergies  Review of Systems  Constitutional: Negative for chills and fever.  HENT: Negative.   Eyes: Negative.   Respiratory: Positive for cough, chest tightness and shortness of breath.        Coughing up green sputum   Cardiovascular: Positive for chest pain.  Gastrointestinal: Negative.   Endocrine: Negative.   Genitourinary: Negative.   Musculoskeletal: Negative.   Skin: Negative.   Allergic/Immunologic: Negative.   Neurological: Negative.   Hematological: Negative.   Psychiatric/Behavioral: Negative.   All other systems reviewed and are negative.      Objective:     BP (!) 159/87   Pulse (!) 130   Temp 97.7 F (36.5 C)   Resp (!) 28   Ht 5\' 7"  (1.702 m)   Wt 133 lb (60.3 kg)   SpO2 97% Comment: sitting  BMI 20.83 kg/m   Physical Exam Vitals signs and nursing note reviewed.  Constitutional:      General: He  is in acute distress.     Appearance: Normal appearance. He is well-developed, well-groomed and normal weight.  HENT:     Head: Normocephalic and atraumatic.     Right Ear: External ear normal.     Left Ear: External ear normal.     Nose: Nose normal.     Mouth/Throat:     Mouth: Mucous membranes are moist.     Pharynx: Oropharynx is clear.  Eyes:     General:        Right eye: No discharge.        Left eye: No discharge.     Conjunctiva/sclera: Conjunctivae normal.  Neck:     Musculoskeletal: Normal range of motion and  neck supple.  Cardiovascular:     Rate and Rhythm: Normal rate and regular rhythm.     Pulses: Normal pulses.     Heart sounds: Normal heart sounds.  Pulmonary:     Effort: Tachypnea and accessory muscle usage present.     Breath sounds: No stridor. Decreased breath sounds and wheezing present. No rhonchi or rales.     Comments: Shortness of breath in conversation  Chest:     Chest wall: No mass, deformity, tenderness, crepitus or edema. There is no dullness to percussion.  Musculoskeletal: Normal range of motion.  Skin:    General: Skin is warm.  Neurological:     General: No focal deficit present.     Mental Status: He is alert and oriented to person, place, and time.  Psychiatric:        Attention and Perception: Attention normal.        Mood and Affect: Mood normal.        Speech: Speech normal.        Behavior: Behavior normal. Behavior is cooperative.        Thought Content: Thought content normal.        Cognition and Memory: Cognition normal.        Judgment: Judgment normal.        Assessment and Plan       1. Asthma with COPD (chronic obstructive pulmonary disease) ([place]) Significant history of asthma/COPD history of tobacco use in the past.  Along with substance abuse including marijuana.  Has prescriptions for albuterol, Advair, Combivent, ProAir and ipratropium nebulizer.  Reports he is used all of these in combination and not felt any  better.  Recent use of prednisone Dosepak by Dr. Moshe Cipro because of breathing difficulties at the end of November.  Reported that he just started getting worse in the last 24 hours.  Just does not feel well feels like there is something going on not his normal shortness of breath that he normally experiences.  Appears to be acutely distressed.  Not his normal self.  has demonstrated changes in his vital signs, difficulty hearing any air movement or quality given vital signs and presentation possible concern for pneumonia or some other pathological process.  Discussed with Dr. Moshe Cipro agreements to send to the hospital for further additional work-up. Patient acknowledged agreement and understanding of the plan.    2. Borderline low oxygen saturation level Upon arrival he was 88% from walking into the building up to 92% within a few minutes of sitting.  Up to 97% after sitting for greater than 5 minutes.  Still having shortness of breath with this.  3. Tachycardia Tachycardic on exam.  And with vital sign intake.  Reports that he is not been drinking a lot of water possible related to dehydration or some form of stress, infection.          Follow up: as scheduled   Perlie Mayo, DNP, AGNP-BC [place], [place], [place] [address] Office Hours: Mon-Thurs 8 am-5 pm; Fri 8 am-12 pm Office Phone:  [phone]  Office Fax: [phone]

## 2018-12-11 ENCOUNTER — Ambulatory Visit: Payer: Self-pay

## 2018-12-11 DIAGNOSIS — J9601 Acute respiratory failure with hypoxia: Secondary | ICD-10-CM

## 2018-12-11 LAB — CBC WITH DIFFERENTIAL/PLATELET
Abs Immature Granulocytes: 0.03 10*3/uL (ref 0.00–0.07)
Basophils Absolute: 0 10*3/uL (ref 0.0–0.1)
Basophils Relative: 0 %
Eosinophils Absolute: 0 10*3/uL (ref 0.0–0.5)
Eosinophils Relative: 0 %
HCT: 42.8 % (ref 39.0–52.0)
Hemoglobin: 13.3 g/dL (ref 13.0–17.0)
Immature Granulocytes: 0 %
Lymphocytes Relative: 6 %
Lymphs Abs: 0.4 10*3/uL — ABNORMAL LOW (ref 0.7–4.0)
MCH: 29 pg (ref 26.0–34.0)
MCHC: 31.1 g/dL (ref 30.0–36.0)
MCV: 93.2 fL (ref 80.0–100.0)
Monocytes Absolute: 0.1 10*3/uL (ref 0.1–1.0)
Monocytes Relative: 1 %
Neutro Abs: 6.6 10*3/uL (ref 1.7–7.7)
Neutrophils Relative %: 93 %
Platelets: 269 10*3/uL (ref 150–400)
RBC: 4.59 MIL/uL (ref 4.22–5.81)
RDW: 13.2 % (ref 11.5–15.5)
WBC: 7.1 10*3/uL (ref 4.0–10.5)
nRBC: 0 % (ref 0.0–0.2)

## 2018-12-11 LAB — BLOOD GAS, ARTERIAL
Acid-Base Excess: 4.6 mmol/L — ABNORMAL HIGH (ref 0.0–2.0)
Bicarbonate: 27.8 mmol/L (ref 20.0–28.0)
FIO2: 28
O2 Saturation: 97.2 %
Patient temperature: 37
pCO2 arterial: 50.2 mmHg — ABNORMAL HIGH (ref 32.0–48.0)
pH, Arterial: 7.385 (ref 7.350–7.450)
pO2, Arterial: 102 mmHg (ref 83.0–108.0)

## 2018-12-11 LAB — COMPREHENSIVE METABOLIC PANEL
ALT: 25 U/L (ref 0–44)
AST: 26 U/L (ref 15–41)
Albumin: 3.7 g/dL (ref 3.5–5.0)
Alkaline Phosphatase: 70 U/L (ref 38–126)
Anion gap: 12 (ref 5–15)
BUN: 17 mg/dL (ref 8–23)
CO2: 27 mmol/L (ref 22–32)
Calcium: 9.3 mg/dL (ref 8.9–10.3)
Chloride: 98 mmol/L (ref 98–111)
Creatinine, Ser: 0.84 mg/dL (ref 0.61–1.24)
GFR calc Af Amer: >60 mL/min (ref >60)
GFR calc non Af Amer: >60 mL/min (ref >60)
Glucose, Bld: 219 mg/dL — ABNORMAL HIGH (ref 70–99)
Potassium: 4.2 mmol/L (ref 3.5–5.1)
Sodium: 137 mmol/L (ref 135–145)
Total Bilirubin: 0.4 mg/dL (ref 0.3–1.2)
Total Protein: 7.3 g/dL (ref 6.5–8.1)

## 2018-12-11 LAB — GLUCOSE, CAPILLARY
Glucose-Capillary: 124 mg/dL — ABNORMAL HIGH (ref 70–99)
Glucose-Capillary: 127 mg/dL — ABNORMAL HIGH (ref 70–99)
Glucose-Capillary: 177 mg/dL — ABNORMAL HIGH (ref 70–99)

## 2018-12-11 LAB — MAGNESIUM: Magnesium: 2.1 mg/dL (ref 1.7–2.4)

## 2018-12-11 LAB — STREP PNEUMONIAE URINARY ANTIGEN: Strep Pneumo Urinary Antigen: NEGATIVE

## 2018-12-11 LAB — PHOSPHORUS: Phosphorus: 3.3 mg/dL (ref 2.5–4.6)

## 2018-12-11 LAB — SARS CORONAVIRUS 2 (TAT 6-24 HRS): SARS Coronavirus 2: NEGATIVE

## 2018-12-11 MED ORDER — INSULIN ASPART 100 UNIT/ML ~~LOC~~ SOLN
0.0000 [IU] | Freq: Three times a day (TID) | SUBCUTANEOUS | Status: DC
  Administered 2018-12-11 (×2): 2 [IU] via SUBCUTANEOUS
  Administered 2018-12-12: 3 [IU] via SUBCUTANEOUS
  Administered 2018-12-12 – 2018-12-13 (×4): 2 [IU] via SUBCUTANEOUS

## 2018-12-11 MED ORDER — LEVALBUTEROL HCL 0.63 MG/3ML IN NEBU
0.6300 mg | INHALATION_SOLUTION | Freq: Four times a day (QID) | RESPIRATORY_TRACT | Status: DC
  Administered 2018-12-11 – 2018-12-13 (×8): 0.63 mg via RESPIRATORY_TRACT
  Filled 2018-12-11 (×8): qty 3

## 2018-12-11 MED ORDER — BUDESONIDE 0.5 MG/2ML IN SUSP
0.5000 mg | Freq: Two times a day (BID) | RESPIRATORY_TRACT | Status: DC
  Administered 2018-12-11 – 2018-12-13 (×4): 0.5 mg via RESPIRATORY_TRACT
  Filled 2018-12-11 (×4): qty 2

## 2018-12-11 MED ORDER — INSULIN ASPART 100 UNIT/ML ~~LOC~~ SOLN: 0.0000 [IU] | Freq: Every day | SUBCUTANEOUS | Status: DC

## 2018-12-11 MED ORDER — DM-GUAIFENESIN ER 30-600 MG PO TB12
1.0000 | ORAL_TABLET | Freq: Two times a day (BID) | ORAL | Status: DC
  Administered 2018-12-11 – 2018-12-13 (×5): 1 via ORAL
  Filled 2018-12-11 (×5): qty 1

## 2018-12-11 MED ORDER — ARFORMOTEROL TARTRATE 15 MCG/2ML IN NEBU
15.0000 ug | INHALATION_SOLUTION | Freq: Two times a day (BID) | RESPIRATORY_TRACT | Status: DC
  Administered 2018-12-11 – 2018-12-13 (×4): 15 ug via RESPIRATORY_TRACT
  Filled 2018-12-11 (×4): qty 2

## 2018-12-11 MED ORDER — IPRATROPIUM BROMIDE 0.02 % IN SOLN
0.5000 mg | Freq: Four times a day (QID) | RESPIRATORY_TRACT | Status: DC
  Administered 2018-12-11 – 2018-12-13 (×8): 0.5 mg via RESPIRATORY_TRACT
  Filled 2018-12-11 (×8): qty 2.5

## 2018-12-11 NOTE — Progress Notes (Signed)
PROGRESS NOTE    [name]  [id number] DOB: [birth year] DOA: 12/10/2018 PCP: Fayrene Helper, MD     Brief Narrative:  As per H&P written by Dr. Velia Meyer on 12/10/18 68 y.o. male with medical history significant for COPD, hypertension, hyperlipidemia, who is admitted to [facility] on 12/10/2018 with community-acquired pneumonia after resenting from home to [facility] emergency department complaining of shortness of breath.  The patient reports 1 to 2 days of progressive shortness of breath associated with new onset nonproductive cough as well as new wheezing over that timeframe.  Denies any associated orthopnea, PND, or peripheral edema.  Denies any associated chest pain, palpitations, diaphoresis, nausea, vomiting.  He also denies any associated subjective fever, chills, rigors, or generalized myalgias.  Denies any recent headache, neck stiffness, rhinitis, sore throat, abdominal pain, diarrhea, or rash.  Denies any recent traveling, and denies any known Covid exposures.  He reports that he lives at home by himself, and reports that he does not frequently venture outside of his house.  Over the last 1 to 2 days, the patient also reports diminished appetite, resulting in a slight decline in his oral intake of both food and fluid over that time.  Assessment & Plan: 1-CAP (community acquired pneumonia) -Affecting left lower lobe -No fever, but is still requiring oxygen supplementation and complaining of productive coughing spells -Continue current IV antibiotics -Follow blood cultures results -Continue supportive care and follow clinical response.  2-COPD exacerbation -Having difficulty speaking in full sentences and with expiratory wheezing on examination. -Continue nebulizer management, steroids and antibiotics as mentioned above. -Wean oxygen supplementation as tolerated  3-Hyperlipidemia -Continue statins -Heart healthy diet encouraged.  4-Essential  hypertension -Stable blood pressures currently -Will continue holding home antihypertensive agents in the setting of acute infection. -Follow vital signs resume antihypertensive medication as required. -Heart healthy diet has been ordered.  5-acute hypoxemic respiratory distress in the setting of community-acquired pneumonia and COPD exacerbation -Continue to wean oxygen supplementation as tolerated -As mentioned above continue treatment for COPD with steroids, nebulizer management, flutter valve and antibiotic therapy -Will also continue treatment with antibiotics for CAP   DVT prophylaxis: Lovenox Code Status: Full code Family Communication: No family at bedside. Disposition Plan: Remains inpatient, continue IV antibiotics, continue treatment for COPD exacerbation.  Wean oxygen supplementation as tolerated.  Consultants:   None  Procedures:   See below for x-ray reports.  Antimicrobials:  Anti-infectives (From admission, onward)   Start     Dose/Rate Route Frequency Ordered Stop   12/11/18 1600  cefTRIAXone (ROCEPHIN) 1 g in sodium chloride 0.9 % 100 mL IVPB     1 g 200 mL/hr over 30 Minutes Intravenous Every 24 hours 12/10/18 1924     12/11/18 1500  azithromycin (ZITHROMAX) 500 mg in sodium chloride 0.9 % 250 mL IVPB     500 mg 250 mL/hr over 60 Minutes Intravenous Every 24 hours 12/10/18 1924     12/10/18 1600  cefTRIAXone (ROCEPHIN) 1 g in sodium chloride 0.9 % 100 mL IVPB     1 g 200 mL/hr over 30 Minutes Intravenous  Once 12/10/18 1552 12/10/18 1723   12/10/18 1600  azithromycin (ZITHROMAX) tablet 500 mg     500 mg Oral  Once 12/10/18 1552 12/10/18 1644      Subjective: No fever, no chest pain, no nausea, no vomiting.  Patient reports feeling slightly better but still with productive cough, shortness of breath, difficulty speaking in full sentences and requiring oxygen  supplementation.  Objective: Vitals:   12/11/18 0448 12/11/18 0454 12/11/18 1445 12/11/18 1508   BP:  (!) 147/83 131/74   Pulse:  (!) 103 97   Resp:  20 20   Temp:  (!) 97.3 F (36.3 C) 98 F (36.7 C)   TempSrc:  Oral    SpO2:  98% 97% 96%  Weight: 57.3 kg     Height:        Intake/Output Summary (Last 24 hours) at 12/11/2018 1559 Last data filed at 12/11/2018 1500 Gross per 24 hour  Intake 340 ml  Output 850 ml  Net -510 ml   Filed Weights   12/10/18 1443 12/10/18 2016 12/11/18 0448  Weight: 62.6 kg 55.8 kg 57.3 kg    Examination: General exam: Alert, awake, oriented x 3; still requiring oxygen supplementation, complaining of productive cough and feeling short of breath with activity.  Mild difficulty speaking in full sentences. Respiratory system: Positive expiratory wheezing, positive rhonchi bilaterally; no using accessory muscles.  Fair air movement bilaterally.  Using 2 L nasal cannula supplementation.  Cardiovascular system:RRR. No murmurs, rubs, gallops. Gastrointestinal system: Abdomen is nondistended, soft and nontender. No organomegaly or masses felt. Normal bowel sounds heard. Central nervous system: Alert and oriented. No focal neurological deficits. Extremities: No cyanosis or clubbing. Skin: No rashes, lesions or ulcers Psychiatry: Judgement and insight appear normal. Mood & affect appropriate.    Data Reviewed: I have personally reviewed following labs and imaging studies  CBC: Recent Labs  Lab 12/10/18 1530 12/11/18 0413  WBC 7.8 7.1  NEUTROABS 5.6 6.6  HGB 14.1 13.3  HCT 45.1 42.8  MCV 93.2 93.2  PLT 258 Q000111Q   Basic Metabolic Panel: Recent Labs  Lab 12/10/18 1530 12/11/18 0413  NA 136 137  K 4.0 4.2  CL 97* 98  CO2 27 27  GLUCOSE 117* 219*  BUN 14 17  CREATININE 0.94 0.84  CALCIUM 9.2 9.3  MG 2.0 2.1  PHOS  --  3.3   GFR: Estimated Creatinine Clearance: 66.3 mL/min (by C-G formula based on SCr of 0.84 mg/dL).   Liver Function Tests: Recent Labs  Lab 12/11/18 0413  AST 26  ALT 25  ALKPHOS 70  BILITOT 0.4  PROT 7.3   ALBUMIN 3.7   CBG: Recent Labs  Lab 12/11/18 1152  GLUCAP 124*   Urine analysis:    Component Value Date/Time   COLORURINE YELLOW 02/17/2011 2236   APPEARANCEUR CLEAR 02/17/2011 2236   LABSPEC >1.030 (H) 02/17/2011 2236   PHURINE 5.5 02/17/2011 2236   GLUCOSEU NEGATIVE 02/17/2011 2236   GLUCOSEU 100 (A) 07/30/2006 0507   HGBUR TRACE (A) 02/17/2011 2236   BILIRUBINUR NEGATIVE 02/17/2011 2236   KETONESUR NEGATIVE 02/17/2011 2236   PROTEINUR NEGATIVE 02/17/2011 2236   UROBILINOGEN 0.2 02/17/2011 2236   NITRITE NEGATIVE 02/17/2011 2236   LEUKOCYTESUR NEGATIVE 02/17/2011 2236    Recent Results (from the past 240 hour(s))  SARS CORONAVIRUS 2 (TAT 6-24 HRS) Nasopharyngeal Nasopharyngeal Swab     Status: None   Collection Time: 12/10/18  3:53 PM   Specimen: Nasopharyngeal Swab  Result Value Ref Range Status   SARS Coronavirus 2 NEGATIVE NEGATIVE Final    Comment: (NOTE) SARS-CoV-2 target nucleic acids are NOT DETECTED. The SARS-CoV-2 RNA is generally detectable in upper and lower respiratory specimens during the acute phase of infection. Negative results do not preclude SARS-CoV-2 infection, do not rule out co-infections with other pathogens, and should not be used as the sole basis for treatment  or other patient management decisions. Negative results must be combined with clinical observations, patient history, and epidemiological information. The expected result is Negative. Fact Sheet for Patients: SugarRoll.be Fact Sheet for Healthcare Providers: https://www.woods-mathews.com/ This test is not yet approved or cleared by the [place] FDA and  has been authorized for detection and/or diagnosis of SARS-CoV-2 by FDA under an Emergency Use Authorization (EUA). This EUA will remain  in effect (meaning this test can be used) for the duration of the COVID-19 declaration under Section 56 4(b)(1) of the Act, 21 U.S.C. section  360bbb-3(b)(1), unless the authorization is terminated or revoked sooner. Performed at [facility], [place] [address], [place], [place] [address]   Culture, blood (routine x 2)     Status: None (Preliminary result)   Collection Time: 12/10/18  3:58 PM   Specimen: Left Antecubital; Blood  Result Value Ref Range Status   Specimen Description LEFT ANTECUBITAL  Final   Special Requests   Final    BOTTLES DRAWN AEROBIC AND ANAEROBIC Blood Culture adequate volume   Culture   Final    NO GROWTH < 24 HOURS Performed at [facility], [address], [place], [place] [address]    Report Status PENDING  Incomplete  Culture, blood (routine x 2)     Status: None (Preliminary result)   Collection Time: 12/10/18  3:59 PM   Specimen: BLOOD LEFT HAND  Result Value Ref Range Status   Specimen Description BLOOD LEFT HAND  Final   Special Requests   Final    BOTTLES DRAWN AEROBIC AND ANAEROBIC Blood Culture adequate volume   Culture   Final    NO GROWTH < 24 HOURS Performed at [facility], [address], [place], [place] [address]    Report Status PENDING  Incomplete     Radiology Studies: Dg Chest Portable 1 View  Result Date: 12/10/2018 CLINICAL DATA:  Worsening shortness of breath since last night. EXAM: PORTABLE CHEST 1 VIEW COMPARISON:  04/18/2018 and older studies. FINDINGS: Hazy airspace opacity is noted in the left lower lung. Lungs are hyperexpanded but otherwise clear. Cardiac silhouette is normal in size. No mediastinal or hilar masses. No pleural effusion or pneumothorax. Skeletal structures are grossly intact. IMPRESSION: 1. Hazy airspace opacity in the left lower lung consistent with pneumonia. No other evidence of acute cardiopulmonary disease. 2. Hyperexpanded lungs consistent with COPD. Electronically Signed   By: Lajean Manes M.D.   On: 12/10/2018 15:14    Scheduled Meds: . arformoterol  15 mcg Nebulization BID  . aspirin  81 mg Oral Daily  . budesonide (PULMICORT)  nebulizer solution  0.5 mg Nebulization BID  . dextromethorphan-guaiFENesin  1 tablet Oral BID  . enoxaparin (LOVENOX) injection  40 mg Subcutaneous Q24H  . insulin aspart  0-15 Units Subcutaneous TID WC  . insulin aspart  0-5 Units Subcutaneous QHS  . ipratropium  0.5 mg Nebulization Q6H WA  . levalbuterol  0.63 mg Nebulization Q6H WA  . methylPREDNISolone (SOLU-MEDROL) injection  40 mg Intravenous Q6H  . pravastatin  40 mg Oral QPM  . sodium chloride flush  3 mL Intravenous Q12H   Continuous Infusions: . azithromycin 500 mg (12/11/18 1524)  . cefTRIAXone (ROCEPHIN)  IV       LOS: 1 day    Time spent: 30 minutes.   Barton Dubois, MD [facility] Pager [phone]   12/11/2018, 3:59 PM

## 2018-12-11 NOTE — Care Management Important Message (Signed)
Important Message  Patient Details  Name: [name] [name]: [id number] Date of Birth: 28-Jul-1950   Medicare Important Message Given:  Yes     [name] 12/11/2018, 2:33 PM

## 2018-12-12 LAB — GLUCOSE, CAPILLARY
Glucose-Capillary: 151 mg/dL — ABNORMAL HIGH (ref 70–99)
Glucose-Capillary: 127 mg/dL — ABNORMAL HIGH (ref 70–99)
Glucose-Capillary: 131 mg/dL — ABNORMAL HIGH (ref 70–99)
Glucose-Capillary: 167 mg/dL — ABNORMAL HIGH (ref 70–99)

## 2018-12-12 MED ORDER — METHYLPREDNISOLONE SODIUM SUCC 40 MG IJ SOLR
40.0000 mg | Freq: Three times a day (TID) | INTRAMUSCULAR | Status: DC
  Administered 2018-12-12 – 2018-12-13 (×4): 40 mg via INTRAVENOUS
  Filled 2018-12-12 (×4): qty 1

## 2018-12-12 MED ORDER — AMOXICILLIN-POT CLAVULANATE 875-125 MG PO TABS
1.0000 | ORAL_TABLET | Freq: Two times a day (BID) | ORAL | Status: DC
  Administered 2018-12-12 – 2018-12-13 (×3): 1 via ORAL
  Filled 2018-12-12 (×3): qty 1

## 2018-12-12 NOTE — Progress Notes (Signed)
PROGRESS NOTE    [name]  [id number] DOB: 05-Nov-1950 DOA: 12/10/2018 PCP: Fayrene Helper, MD     Brief Narrative:  As per H&P written by Dr. Velia Meyer on 12/10/18 68 y.o. male with medical history significant for COPD, hypertension, hyperlipidemia, who is admitted to [facility] on 12/10/2018 with community-acquired pneumonia after resenting from home to [facility] emergency department complaining of shortness of breath.  The patient reports 1 to 2 days of progressive shortness of breath associated with new onset nonproductive cough as well as new wheezing over that timeframe.  Denies any associated orthopnea, PND, or peripheral edema.  Denies any associated chest pain, palpitations, diaphoresis, nausea, vomiting.  He also denies any associated subjective fever, chills, rigors, or generalized myalgias.  Denies any recent headache, neck stiffness, rhinitis, sore throat, abdominal pain, diarrhea, or rash.  Denies any recent traveling, and denies any known Covid exposures.  He reports that he lives at home by himself, and reports that he does not frequently venture outside of his house.  Over the last 1 to 2 days, the patient also reports diminished appetite, resulting in a slight decline in his oral intake of both food and fluid over that time.  Assessment & Plan: 1-CAP (community acquired pneumonia) -Affecting left lower lobe. -No fever, but is still requiring oxygen supplementation and complaining of productive coughing spells -Continue antibiotics, but I will change to oral route and assess tolerance.  -Follow blood cultures results, so far no growth. -Continue supportive care and follow clinical response.  2-COPD exacerbation -Having difficulty speaking in full sentences and with expiratory wheezing on examination. -Continue nebulizer management, steroids and antibiotics as mentioned above. -Wean oxygen supplementation as tolerated  3-Hyperlipidemia -Continue  statins -Heart healthy diet encouraged.  4-Essential hypertension -Stable blood pressures currently -Will continue holding home antihypertensive agents in the setting of acute infection. -Follow vital signs resume antihypertensive medication as required. -Heart healthy diet has been ordered.  5-acute hypoxemic respiratory distress in the setting of community-acquired pneumonia and COPD exacerbation -Continue to wean oxygen supplementation as tolerated -As mentioned above continue treatment for COPD with steroids, nebulizer management, flutter valve and antibiotic therapy -Will also continue treatment with antibiotics for CAP   DVT prophylaxis: Lovenox Code Status: Full code Family Communication: No family at bedside. Disposition Plan: Remains inpatient, continue antibiotics, will transition to oral route. Continue treatment for COPD exacerbation and start flutter valve.  Continue to wean oxygen supplementation as tolerated.  Consultants:   None  Procedures:   See below for x-ray reports.  Antimicrobials:  Anti-infectives (From admission, onward)   Start     Dose/Rate Route Frequency Ordered Stop   12/12/18 1100  amoxicillin-clavulanate (AUGMENTIN) 875-125 MG per tablet 1 tablet     1 tablet Oral Every 12 hours 12/12/18 1058     12/11/18 1600  cefTRIAXone (ROCEPHIN) 1 g in sodium chloride 0.9 % 100 mL IVPB  Status:  Discontinued     1 g 200 mL/hr over 30 Minutes Intravenous Every 24 hours 12/10/18 1924 12/12/18 1058   12/11/18 1500  azithromycin (ZITHROMAX) 500 mg in sodium chloride 0.9 % 250 mL IVPB  Status:  Discontinued     500 mg 250 mL/hr over 60 Minutes Intravenous Every 24 hours 12/10/18 1924 12/12/18 1058   12/10/18 1600  cefTRIAXone (ROCEPHIN) 1 g in sodium chloride 0.9 % 100 mL IVPB     1 g 200 mL/hr over 30 Minutes Intravenous  Once 12/10/18 1552 12/10/18 1723  12/10/18 1600  azithromycin (ZITHROMAX) tablet 500 mg     500 mg Oral  Once 12/10/18 1552 12/10/18 1644       Subjective: No fever, no chest pain, no nausea, no vomiting.  Requiring 2 L nasal cannula supplementation, mild difficulty speaking in full sentences and feeling short of breath with minimal exertion.  Still with diffuse rhonchi and positive expiratory wheezing.  Objective: Vitals:   12/12/18 0329 12/12/18 0609 12/12/18 0800 12/12/18 0810  BP:  110/87    Pulse:  95    Resp:  20    Temp:  98.1 F (36.7 C)    TempSrc:  Oral    SpO2:  93% 97% 98%  Weight: 56.9 kg     Height:        Intake/Output Summary (Last 24 hours) at 12/12/2018 1114 Last data filed at 12/11/2018 2135 Gross per 24 hour  Intake 243 ml  Output 750 ml  Net -507 ml   Filed Weights   12/10/18 2016 12/11/18 0448 12/12/18 0329  Weight: 55.8 kg 57.3 kg 56.9 kg    Examination: General exam: Alert, awake, oriented x 3; still complaining of shortness of breath with minimal exertion and requiring oxygen supplementation.  Mild difficulty speaking in full sentences appreciated on exam.  Denies chest pain, no nausea, no vomiting. Respiratory system: Positive bilateral rhonchi, expiratory wheezing, mild tachypnea with activity (getting out of bed to use the restroom).  2 L nasal cannula supplementation in place.  No using accessory muscle. Cardiovascular system:RRR. No murmurs, rubs, gallops. Gastrointestinal system: Abdomen is nondistended, soft and nontender. No organomegaly or masses felt. Normal bowel sounds heard. Central nervous system: Alert and oriented. No focal neurological deficits. Extremities: No C/C/E, +pedal pulses Skin: No rashes, lesions or ulcers Psychiatry: Judgement and insight appear normal. Mood & affect appropriate.    Data Reviewed: I have personally reviewed following labs and imaging studies  CBC: Recent Labs  Lab 12/10/18 1530 12/11/18 0413  WBC 7.8 7.1  NEUTROABS 5.6 6.6  HGB 14.1 13.3  HCT 45.1 42.8  MCV 93.2 93.2  PLT 258 Q000111Q   Basic Metabolic Panel: Recent Labs  Lab  12/10/18 1530 12/11/18 0413  NA 136 137  K 4.0 4.2  CL 97* 98  CO2 27 27  GLUCOSE 117* 219*  BUN 14 17  CREATININE 0.94 0.84  CALCIUM 9.2 9.3  MG 2.0 2.1  PHOS  --  3.3   GFR: Estimated Creatinine Clearance: 65.9 mL/min (by C-G formula based on SCr of 0.84 mg/dL).   Liver Function Tests: Recent Labs  Lab 12/11/18 0413  AST 26  ALT 25  ALKPHOS 70  BILITOT 0.4  PROT 7.3  ALBUMIN 3.7   CBG: Recent Labs  Lab 12/11/18 1152 12/11/18 1634 12/11/18 2110 12/12/18 0721 12/12/18 1103  GLUCAP 124* 127* 177* 151* 127*   Urine analysis:    Component Value Date/Time   COLORURINE YELLOW 02/17/2011 2236   APPEARANCEUR CLEAR 02/17/2011 2236   LABSPEC >1.030 (H) 02/17/2011 2236   PHURINE 5.5 02/17/2011 2236   GLUCOSEU NEGATIVE 02/17/2011 2236   GLUCOSEU 100 (A) 07/30/2006 0507   HGBUR TRACE (A) 02/17/2011 2236   BILIRUBINUR NEGATIVE 02/17/2011 2236   KETONESUR NEGATIVE 02/17/2011 2236   PROTEINUR NEGATIVE 02/17/2011 2236   UROBILINOGEN 0.2 02/17/2011 2236   NITRITE NEGATIVE 02/17/2011 2236   LEUKOCYTESUR NEGATIVE 02/17/2011 2236    Recent Results (from the past 240 hour(s))  SARS CORONAVIRUS 2 (TAT 6-24 HRS) Nasopharyngeal Nasopharyngeal Swab  Status: None   Collection Time: 12/10/18  3:53 PM   Specimen: Nasopharyngeal Swab  Result Value Ref Range Status   SARS Coronavirus 2 NEGATIVE NEGATIVE Final    Comment: (NOTE) SARS-CoV-2 target nucleic acids are NOT DETECTED. The SARS-CoV-2 RNA is generally detectable in upper and lower respiratory specimens during the acute phase of infection. Negative results do not preclude SARS-CoV-2 infection, do not rule out co-infections with other pathogens, and should not be used as the sole basis for treatment or other patient management decisions. Negative results must be combined with clinical observations, patient history, and epidemiological information. The expected result is Negative. Fact Sheet for  Patients: SugarRoll.be Fact Sheet for Healthcare Providers: https://www.woods-mathews.com/ This test is not yet approved or cleared by the [place] FDA and  has been authorized for detection and/or diagnosis of SARS-CoV-2 by FDA under an Emergency Use Authorization (EUA). This EUA will remain  in effect (meaning this test can be used) for the duration of the COVID-19 declaration under Section 56 4(b)(1) of the Act, 21 U.S.C. section 360bbb-3(b)(1), unless the authorization is terminated or revoked sooner. Performed at [facility], [place] [address], [place], [place] [address]   Culture, blood (routine x 2)     Status: None (Preliminary result)   Collection Time: 12/10/18  3:58 PM   Specimen: Left Antecubital; Blood  Result Value Ref Range Status   Specimen Description LEFT ANTECUBITAL  Final   Special Requests   Final    BOTTLES DRAWN AEROBIC AND ANAEROBIC Blood Culture adequate volume   Culture   Final    NO GROWTH 2 DAYS Performed at [facility], [address] [place],  [address]    Report Status PENDING  Incomplete  Culture, blood (routine x 2)     Status: None (Preliminary result)   Collection Time: 12/10/18  3:59 PM   Specimen: BLOOD LEFT HAND  Result Value Ref Range Status   Specimen Description BLOOD LEFT HAND  Final   Special Requests   Final    BOTTLES DRAWN AEROBIC AND ANAEROBIC Blood Culture adequate volume   Culture   Final    NO GROWTH 2 DAYS Performed at [facility], [address] [place],  [address]    Report Status PENDING  Incomplete     Radiology Studies: Dg Chest Portable 1 View  Result Date: 12/10/2018 CLINICAL DATA:  Worsening shortness of breath since last night. EXAM: PORTABLE CHEST 1 VIEW COMPARISON:  04/18/2018 and older studies. FINDINGS: Hazy airspace opacity is noted in the left lower lung. Lungs are hyperexpanded but otherwise clear. Cardiac silhouette is normal in size. No  mediastinal or hilar masses. No pleural effusion or pneumothorax. Skeletal structures are grossly intact. IMPRESSION: 1. Hazy airspace opacity in the left lower lung consistent with pneumonia. No other evidence of acute cardiopulmonary disease. 2. Hyperexpanded lungs consistent with COPD. Electronically Signed   By: Lajean Manes M.D.   On: 12/10/2018 15:14    Scheduled Meds: . amoxicillin-clavulanate  1 tablet Oral Q12H  . arformoterol  15 mcg Nebulization BID  . aspirin  81 mg Oral Daily  . budesonide (PULMICORT) nebulizer solution  0.5 mg Nebulization BID  . dextromethorphan-guaiFENesin  1 tablet Oral BID  . enoxaparin (LOVENOX) injection  40 mg Subcutaneous Q24H  . insulin aspart  0-15 Units Subcutaneous TID WC  . insulin aspart  0-5 Units Subcutaneous QHS  . ipratropium  0.5 mg Nebulization Q6H WA  . levalbuterol  0.63 mg Nebulization Q6H  WA  . methylPREDNISolone (SOLU-MEDROL) injection  40 mg Intravenous Q8H  . pravastatin  40 mg Oral QPM  . sodium chloride flush  3 mL Intravenous Q12H   Continuous Infusions:    LOS: 2 days    Time spent: 30 minutes.   Barton Dubois, MD [facility] Pager [phone]   12/12/2018, 11:14 AM

## 2018-12-13 DIAGNOSIS — J9601 Acute respiratory failure with hypoxia: Secondary | ICD-10-CM

## 2018-12-13 DIAGNOSIS — J181 Lobar pneumonia, unspecified organism: Secondary | ICD-10-CM

## 2018-12-13 HISTORY — DX: Lobar pneumonia, unspecified organism: J18.1

## 2018-12-13 LAB — BASIC METABOLIC PANEL
Anion gap: 9 (ref 5–15)
BUN: 23 mg/dL (ref 8–23)
CO2: 31 mmol/L (ref 22–32)
Calcium: 9 mg/dL (ref 8.9–10.3)
Chloride: 101 mmol/L (ref 98–111)
Creatinine, Ser: 0.76 mg/dL (ref 0.61–1.24)
GFR calc Af Amer: >60 mL/min (ref >60)
GFR calc non Af Amer: >60 mL/min (ref >60)
Glucose, Bld: 154 mg/dL — ABNORMAL HIGH (ref 70–99)
Potassium: 4.5 mmol/L (ref 3.5–5.1)
Sodium: 141 mmol/L (ref 135–145)

## 2018-12-13 LAB — CBC
HCT: 39.1 % (ref 39.0–52.0)
Hemoglobin: 12.1 g/dL — ABNORMAL LOW (ref 13.0–17.0)
MCH: 28.9 pg (ref 26.0–34.0)
MCHC: 30.9 g/dL (ref 30.0–36.0)
MCV: 93.5 fL (ref 80.0–100.0)
Platelets: 264 10*3/uL (ref 150–400)
RBC: 4.18 MIL/uL — ABNORMAL LOW (ref 4.22–5.81)
RDW: 13.2 % (ref 11.5–15.5)
WBC: 16 10*3/uL — ABNORMAL HIGH (ref 4.0–10.5)
nRBC: 0 % (ref 0.0–0.2)

## 2018-12-13 LAB — GLUCOSE, CAPILLARY
Glucose-Capillary: 146 mg/dL — ABNORMAL HIGH (ref 70–99)
Glucose-Capillary: 141 mg/dL — ABNORMAL HIGH (ref 70–99)

## 2018-12-13 LAB — PROCALCITONIN: Procalcitonin: <0.1 ng/mL

## 2018-12-13 LAB — HEMOGLOBIN A1C
Hgb A1c MFr Bld: 6.4 % — ABNORMAL HIGH (ref 4.8–5.6)
Mean Plasma Glucose: 136.98 mg/dL

## 2018-12-13 MED ORDER — AMOXICILLIN-POT CLAVULANATE 875-125 MG PO TABS: 1.0000 | ORAL_TABLET | Freq: Two times a day (BID) | ORAL | 0 refills | Status: DC

## 2018-12-13 MED ORDER — PREDNISONE 50 MG PO TABS: 50.0000 mg | ORAL_TABLET | Freq: Every day | ORAL | 0 refills | Status: DC

## 2018-12-13 MED ORDER — LEVOFLOXACIN 500 MG PO TABS
500.0000 mg | ORAL_TABLET | Freq: Once | ORAL | Status: AC
  Administered 2018-12-13: 500 mg via ORAL
  Filled 2018-12-13: qty 1

## 2018-12-13 NOTE — Progress Notes (Signed)
Patient with no complaints at this time. Respirations even and unlabored. Skin warm/dry. Discharge instructions reviewed with patient at this time. Patient given opportunity to voice concerns/ask questions. IV removed per policy and band-aid applied to site. Pt taken to exit via wheelchair and left hospital in good/stable condition

## 2018-12-13 NOTE — Discharge Summary (Signed)
Physician Discharge Summary  [name] [id number] DOB: April 11, 1950 DOA: 12/10/2018  PCP: Fayrene Helper, MD  Admit date: 12/10/2018 Discharge date: 12/13/2018  Admitted From:Home Disposition:  Home   Recommendations for Outpatient Follow-up:  1. Follow up with PCP in 1-2 weeks 2. Please obtain BMP/CBC in one week     Discharge Condition: Stable CODE STATUS: FULL Diet recommendation: Heart Healthy   Brief/Interim Summary: As per H&P written by Dr. Velia Meyer on 12/10/18 68 y.o.malewith medical history significant forCOPD, hypertension, hyperlipidemia, who is admitted to [facility] on 12/10/2018 with community-acquired pneumonia after resenting from home to [facility] emergency department complaining of shortness of breath x 1-2 days  The patient reports 1 to 2 days of progressive shortness of breath associated with new onset nonproductive cough as well asnewwheezing over that timeframe.Denies any associated orthopnea, PND, or peripheral edema. Denies any associated chest pain, palpitations, diaphoresis, nausea, vomiting.He also denies any associated subjective fever, chills, rigors, or generalized myalgias. Denies any recent headache, neck stiffness, rhinitis, sore throat,abdominal pain, diarrhea,or rash. Denies any recent traveling, and denies any known Covid exposures. He reports that he lives at home by himself, and reports that he does not frequently venture outside of his house.Over the last 1 to 2 days, the patient also reports diminished appetite, resulting in a slight decline in his oral intake of both food and fluid over that time.   He saw his PCP who noted patient to have oxygen saturation in upper 80%s and he was sent to ED.  Discharge Diagnoses:  acute hypoxemic respiratory failure  -due to community-acquired pneumonia and COPD exacerbation -initially on 3L -weaned to RA -ambulatory pulseox on day of d/c did not show oxygen desaturation  <88%  CAP (community acquired pneumonia) -personally reviewed CXR--LLL opacity -initially started on ceftriaxone and azithromycin -Follow blood cultures results, so far no growth. -d/c home with amox/clav x 3 more days to complete one week of tx  COPD exacerbation -Having difficulty speaking in full sentences and with expiratory wheezing on examination initially -Continue nebulizer management, IV steroids and antibiotics as mentioned above. -d/c home with prednisone 50 mg daily x 3 more days to finish one week burst  Hyperlipidemia -Continue statins.  Essential hypertension -Stable blood pressures currently -restart Maxzide after d/c  Impaired Glucose Tolerance -lifestyle modification -12/6 A1C--6.4    Discharge Instructions   Allergies as of 12/13/2018   No Known Allergies     Medication List    STOP taking these medications   omeprazole 40 MG capsule Commonly known as: PRILOSEC     TAKE these medications   amoxicillin-clavulanate 875-125 MG tablet Commonly known as: Augmentin Take 1 tablet by mouth 2 (two) times daily. Start taking on: December 14, 2018   aspirin 81 MG tablet Take 81 mg by mouth daily.   Fluticasone-Salmeterol 500-50 MCG/DOSE Aepb Commonly known as: Advair Diskus Inhale 1 puff into the lungs 2 (two) times daily. INHALE 1 PUFF BY MOUTH TWICE A DAY. RINSE MOUTH AFTER USE. What changed: additional instructions   ipratropium 0.02 % nebulizer solution Commonly known as: ATROVENT Take 2.5 mLs (0.5 mg total) by nebulization every 6 (six) hours as needed for wheezing or shortness of breath.   Ipratropium-Albuterol 20-100 MCG/ACT Aers respimat Commonly known as: COMBIVENT One puff into lungs every 6 hours What changed:   how much to take  how to take this  when to take this  additional instructions   pravastatin 40 MG tablet Commonly known as: PRAVACHOL  TAKE (1) TABLET BY MOUTH AT BEDTIME. What changed: See the new instructions.    predniSONE 50 MG tablet Commonly known as: DELTASONE Take 1 tablet (50 mg total) by mouth daily with breakfast. Start taking on: December 14, 2018   ProAir HFA 108 (90 Base) MCG/ACT inhaler Generic drug: albuterol Inhale 2 puffs into the lungs every 6 (six) hours as needed for wheezing or shortness of breath. What changed: Another medication with the same name was changed. Make sure you understand how and when to take each.   albuterol (2.5 MG/3ML) 0.083% nebulizer solution Commonly known as: PROVENTIL TAKE 1 AMPULE (3 MLS) BY NEBULIZATION AS DIRECTED EVERY 4 HOURS AS NEEDED. What changed:   how much to take  how to take this  when to take this  reasons to take this   triamterene-hydrochlorothiazide 37.5-25 MG tablet Commonly known as: MAXZIDE-25 TAKE 1 & 1/2 TABLETS BY MOUTH ONCE DAILY. What changed: when to take this       No Known Allergies  Consultations:  none   Procedures/Studies: Dg Chest Portable 1 View  Result Date: 12/10/2018 CLINICAL DATA:  Worsening shortness of breath since last night. EXAM: PORTABLE CHEST 1 VIEW COMPARISON:  04/18/2018 and older studies. FINDINGS: Hazy airspace opacity is noted in the left lower lung. Lungs are hyperexpanded but otherwise clear. Cardiac silhouette is normal in size. No mediastinal or hilar masses. No pleural effusion or pneumothorax. Skeletal structures are grossly intact. IMPRESSION: 1. Hazy airspace opacity in the left lower lung consistent with pneumonia. No other evidence of acute cardiopulmonary disease. 2. Hyperexpanded lungs consistent with COPD. Electronically Signed   By: Lajean Manes M.D.   On: 12/10/2018 15:14         Discharge Exam: Vitals:   12/13/18 0813 12/13/18 1401  BP:    Pulse:    Resp:    Temp:    SpO2: 93% 97%   Vitals:   12/13/18 0803 12/13/18 0808 12/13/18 0813 12/13/18 1401  BP:      Pulse:      Resp:      Temp:      TempSrc:      SpO2: 93% 93% 93% 97%  Weight:      Height:         General: Pt is alert, awake, not in acute distress Cardiovascular: RRR, S1/S2 +, no rubs, no gallops Respiratory: diminished breath sounds. No wheeze Abdominal: Soft, NT, ND, bowel sounds + Extremities: no edema, no cyanosis   The results of significant diagnostics from this hospitalization (including imaging, microbiology, ancillary and laboratory) are listed below for reference.    Significant Diagnostic Studies: Dg Chest Portable 1 View  Result Date: 12/10/2018 CLINICAL DATA:  Worsening shortness of breath since last night. EXAM: PORTABLE CHEST 1 VIEW COMPARISON:  04/18/2018 and older studies. FINDINGS: Hazy airspace opacity is noted in the left lower lung. Lungs are hyperexpanded but otherwise clear. Cardiac silhouette is normal in size. No mediastinal or hilar masses. No pleural effusion or pneumothorax. Skeletal structures are grossly intact. IMPRESSION: 1. Hazy airspace opacity in the left lower lung consistent with pneumonia. No other evidence of acute cardiopulmonary disease. 2. Hyperexpanded lungs consistent with COPD. Electronically Signed   By: Lajean Manes M.D.   On: 12/10/2018 15:14     Microbiology: Recent Results (from the past 240 hour(s))  SARS CORONAVIRUS 2 (Laurina Fischl 6-24 HRS) Nasopharyngeal Nasopharyngeal Swab     Status: None   Collection Time: 12/10/18  3:53 PM   Specimen:  Nasopharyngeal Swab  Result Value Ref Range Status   SARS Coronavirus 2 NEGATIVE NEGATIVE Final    Comment: (NOTE) SARS-CoV-2 target nucleic acids are NOT DETECTED. The SARS-CoV-2 RNA is generally detectable in upper and lower respiratory specimens during the acute phase of infection. Negative results do not preclude SARS-CoV-2 infection, do not rule out co-infections with other pathogens, and should not be used as the sole basis for treatment or other patient management decisions. Negative results must be combined with clinical observations, patient history, and epidemiological information.  The expected result is Negative. Fact Sheet for Patients: SugarRoll.be Fact Sheet for Healthcare Providers: https://www.woods-mathews.com/ This test is not yet approved or cleared by the [place] FDA and  has been authorized for detection and/or diagnosis of SARS-CoV-2 by FDA under an Emergency Use Authorization (EUA). This EUA will remain  in effect (meaning this test can be used) for the duration of the COVID-19 declaration under Section 56 4(b)(1) of the Act, 21 U.S.C. section 360bbb-3(b)(1), unless the authorization is terminated or revoked sooner. Performed at [facility], [place] [address], [place], [place] [address]   Culture, blood (routine x 2)     Status: None (Preliminary result)   Collection Time: 12/10/18  3:58 PM   Specimen: Left Antecubital; Blood  Result Value Ref Range Status   Specimen Description LEFT ANTECUBITAL  Final   Special Requests   Final    BOTTLES DRAWN AEROBIC AND ANAEROBIC Blood Culture adequate volume   Culture   Final    NO GROWTH 3 DAYS Performed at [facility], [address] [place], [place] [address]    Report Status PENDING  Incomplete  Culture, blood (routine x 2)     Status: None (Preliminary result)   Collection Time: 12/10/18  3:59 PM   Specimen: BLOOD LEFT HAND  Result Value Ref Range Status   Specimen Description BLOOD LEFT HAND  Final   Special Requests   Final    BOTTLES DRAWN AEROBIC AND ANAEROBIC Blood Culture adequate volume   Culture   Final    NO GROWTH 3 DAYS Performed at [facility], [address] [place], [place] [address]    Report Status PENDING  Incomplete     Labs: Basic Metabolic Panel: Recent Labs  Lab 12/10/18 1530 12/11/18 0413 12/13/18 0615  NA 136 137 141  K 4.0 4.2 4.5  CL 97* 98 101  CO2 27 27 31   GLUCOSE 117* 219* 154*  BUN 14 17 23   CREATININE 0.94 0.84 0.76  CALCIUM 9.2 9.3 9.0  MG 2.0 2.1  --   PHOS  --  3.3  --    Liver Function  Tests: Recent Labs  Lab 12/11/18 0413  AST 26  ALT 25  ALKPHOS 70  BILITOT 0.4  PROT 7.3  ALBUMIN 3.7   No results for input(s): LIPASE, AMYLASE in the last 168 hours. No results for input(s): AMMONIA in the last 168 hours. CBC: Recent Labs  Lab 12/10/18 1530 12/11/18 0413 12/13/18 0615  WBC 7.8 7.1 16.0*  NEUTROABS 5.6 6.6  --   HGB 14.1 13.3 12.1*  HCT 45.1 42.8 39.1  MCV 93.2 93.2 93.5  PLT 258 269 264   Cardiac Enzymes: No results for input(s): CKTOTAL, CKMB, CKMBINDEX, TROPONINI in the last 168 hours. BNP: Invalid input(s): POCBNP CBG: Recent Labs  Lab 12/12/18 1103 12/12/18 1633 12/12/18 2131 12/13/18 0737 12/13/18 1119  GLUCAP 127* 131* 167* 146* 141*    Time coordinating discharge:  36  minutes  Signed:  Orson Eva, DO [facility] Pager: [phone] 12/13/2018, 2:17 PM

## 2018-12-14 ENCOUNTER — Telehealth: Payer: Self-pay

## 2018-12-14 ENCOUNTER — Telehealth: Payer: Self-pay | Admitting: *Deleted

## 2018-12-14 NOTE — Telephone Encounter (Signed)
Transition Care Management Follow-up Telephone Call   Date discharged?  12/13/2018              How have you been since you were released from the hospital? been feeling alright   Do you understand why you were in the hospital? Pneumonia and copd exerbation   Do you understand the discharge instructions? yes   Where were you discharged to? home   Items Reviewed:  Medications reviewed: yes  Allergies reviewed: yes  Dietary changes reviewed: yes  Referrals reviewed: no new referrals   Functional Questionnaire:   Activities of Daily Living (ADLs):  yes    Any transportation issues/concerns?: no   Any patient concerns? no   Confirmed importance and date/time of follow-up visits scheduled yes     Confirmed with patient if condition begins to worsen call PCP or go to the ER.  Patient was given the office number and encouraged to call back with question or concerns. Yes with verbal understanding

## 2018-12-14 NOTE — Telephone Encounter (Signed)
TOC call completed and forwarded to provider

## 2018-12-14 NOTE — Telephone Encounter (Signed)
[id number]  Attempted to contact patient to complete [facility] telephone call. No answer. Left message requesting call back. Will try again later. 1st attempt

## 2018-12-14 NOTE — Telephone Encounter (Signed)
Pt needs a TOC call was dicharged 12-13-18 has a toc appt 12-18-18 at 9am

## 2018-12-14 NOTE — Telephone Encounter (Signed)
Called patient to complete toc call. No answer. Will try again later.  

## 2018-12-15 LAB — CULTURE, BLOOD (ROUTINE X 2)
Culture: NO GROWTH
Culture: NO GROWTH
Special Requests: ADEQUATE
Special Requests: ADEQUATE

## 2018-12-17 ENCOUNTER — Ambulatory Visit: Payer: Self-pay

## 2018-12-17 ENCOUNTER — Other Ambulatory Visit: Payer: Self-pay

## 2018-12-17 NOTE — Patient Outreach (Addendum)
[facility]) Care Management  12/17/2018  [name] Sep 01, 1950 [id number]   Patient linked to [facility] program and was supposed to receive first delivery on 12/10/18, however, he was hospitalized that day.  Attempted to contact him today to determine if meals were received prior to him going to hospital.  Left voicemail message.  Patient did call back but this writer was not available to take call. Called patient back but had to leave another message.  Will attempt to reach him again within four business days if no return call.    Addendum: Received return call from patient.  He did receive [employer] delivery prior to recent hospitalization.  Reminded patient that he will receive last delivery by 12/24/18. Closing social work case at this time.  Ronn Melena, BSW Social Worker [phone]

## 2018-12-18 ENCOUNTER — Ambulatory Visit (INDEPENDENT_AMBULATORY_CARE_PROVIDER_SITE_OTHER): Payer: Medicare Other | Admitting: Family Medicine

## 2018-12-18 ENCOUNTER — Encounter: Payer: Self-pay | Admitting: Family Medicine

## 2018-12-18 ENCOUNTER — Other Ambulatory Visit: Payer: Self-pay | Admitting: Family Medicine

## 2018-12-18 ENCOUNTER — Other Ambulatory Visit: Payer: Self-pay

## 2018-12-18 VITALS — BP 148/87 | HR 87 | Temp 98.7°F | Ht 67.0 in | Wt 131.2 lb

## 2018-12-18 DIAGNOSIS — Z7689 Persons encountering health services in other specified circumstances: Secondary | ICD-10-CM | POA: Diagnosis not present

## 2018-12-18 DIAGNOSIS — J189 Pneumonia, unspecified organism: Secondary | ICD-10-CM

## 2018-12-18 DIAGNOSIS — I10 Essential (primary) hypertension: Secondary | ICD-10-CM | POA: Diagnosis not present

## 2018-12-18 DIAGNOSIS — J449 Chronic obstructive pulmonary disease, unspecified: Secondary | ICD-10-CM

## 2018-12-18 NOTE — Patient Instructions (Addendum)
  I appreciate the opportunity to provide you with care for your health and wellness. Today we discussed: recent hospital stay related to pneumonia   Follow up: as scheduled in March   Labs today   We are so glad you are feeling better!  Continue all medications as ordered.  I hope you have a wonderful, happy, safe, and healthy Holiday Season! See you in the New Year :)  Please continue to practice social distancing to keep you, your family, and our community safe.  If you must go out, please wear a mask and practice good handwashing.  It was a pleasure to see you and I look forward to continuing to work together on your health and well-being. Please do not hesitate to call the office if you need care or have questions about your care.  Have a wonderful day and week. With Gratitude, [name], DNP, AGNP-BC

## 2018-12-18 NOTE — Progress Notes (Signed)
Subjective:     Patient ID: [name], male   DOB: 04-Sep-1950, 68 y.o.   [name]: [id number]  [name] presents for Hospitalization Follow-up (pneumonia) [name] is a 68 year old who presents today for follow-up after being in the hospital for pneumonia.  He has a significant medical history including COPD, hypertension, hyperlipidemia, admitted on December 10, 2018 with a community-acquired pneumonia after I sent him to the emergency room for shortness of breath for couple days.  [name] presented for an office visit on December 3 of 2020.  At that time he was showing progressive shortness of breath with a new onset and nonproductive cough, wheezing, chest pain on the left side.  Tachycardic, oxygenation 88%, slight elevation in blood pressure.  He denied having any associated orthopnea, PND, peripheral edema.  He denied having palpitations, diaphoresis, nausea and vomiting.  He denied having fevers, chills, rigors, generalized malaise.  He denied any recent headache, neck stiffness rhinitis, sore throat, abdominal pain, diarrhea or rash.  Denied any recent travel or any known Covid exposures in the office and in the emergency room.  Additionally he reported that he had diminished in his appetite and has not been eating or drinking as he normally is.  Chest x-ray was obtained while in the emergency room.  Demonstrated lower lobe left side opacity consistent with community-acquired pneumonia.  Initially started on ceftriaxone and azithromycin.  Blood cultures were clear.  Discharged home on Augmentin for 3 days to complete a week course of treatment.  Additionally was put on prednisone to help with COPD exacerbation.  Initially was on 3 L of oxygen but was weaned to room air prior to discharge.  He was advised to follow-up with PCP within the week and get repeat labs.  Today he reports he is feeling much better.  Does not have any demonstrated shortness of breath accessory muscle use.   Denies having any issues with any of the medications he has completed the prednisone and the Augmentin as ordered.  Reports using inhalers as his baseline.  Overall feels like he is doing much better.  Today patient denies signs and symptoms of COVID 19 infection including fever, chills, cough, shortness of breath, and headache.  Past Medical, Surgical, Social History, Allergies, and Medications have been Reviewed.  Past Medical History:  Diagnosis Date  . Asthma   . COPD (chronic obstructive pulmonary disease) ([place])   . CVA (cerebral vascular accident) ([place]) 2008   with temporary vision loss   . Depression   . Elevated PSA 2014   no diagnosis pf prostate cancer in 07/2014  . History of substance abuse ([place]) 01/14/2011   Marijuana  . History of tobacco abuse 01/14/2011  . Hyperlipidemia   . Hypertension   . Marijuana abuse   . Nicotine addiction   . Prediabetes 2014   No past surgical history on file. Social History   Socioeconomic History  . Marital status: Widowed    Spouse name: Not on file  . Number of children: 1  . Years of education: Not on file  . Highest education level: Not on file  Occupational History  . Occupation: unemployed   Tobacco Use  . Smoking status: Former Smoker    Packs/day: 0.50    Years: 40.00    Pack years: 20.00    Types: Cigarettes    Quit date: 04/14/2015    Years since quitting: 3.6  . Smokeless tobacco: Never Used  Substance and Sexual Activity  .  Alcohol use: Yes    Alcohol/week: 3.0 standard drinks    Types: 3 Cans of beer per week    Comment: occasionally  . Drug use: No    Comment: pt quit 20 years ago  . Sexual activity: Not Currently  Other Topics Concern  . Not on file  Social History Narrative  . Not on file   Social Determinants of Health   Financial Resource Strain:   . Difficulty of Paying Living Expenses: Not on file  Food Insecurity:   . Worried About Charity fundraiser in the Last Year: Not on file  . Ran Out of  Food in the Last Year: Not on file  Transportation Needs:   . Lack of Transportation (Medical): Not on file  . Lack of Transportation (Non-Medical): Not on file  Physical Activity:   . Days of Exercise per Week: Not on file  . Minutes of Exercise per Session: Not on file  Stress:   . Feeling of Stress : Not on file  Social Connections:   . Frequency of Communication with Friends and Family: Not on file  . Frequency of Social Gatherings with Friends and Family: Not on file  . Attends Religious Services: Not on file  . Active Member of Clubs or Organizations: Not on file  . Attends Archivist Meetings: Not on file  . Marital Status: Not on file  Intimate Partner Violence:   . Fear of Current or Ex-Partner: Not on file  . Emotionally Abused: Not on file  . Physically Abused: Not on file  . Sexually Abused: Not on file    Outpatient Encounter Medications as of 12/18/2018  Medication Sig  . albuterol (PROVENTIL) (2.5 MG/3ML) 0.083% nebulizer solution TAKE 1 AMPULE (3 MLS) BY NEBULIZATION AS DIRECTED EVERY 4 HOURS AS NEEDED. (Patient taking differently: Take 2.5 mg by nebulization every 6 (six) hours as needed for wheezing or shortness of breath. TAKE 1 AMPULE (3 MLS) BY NEBULIZATION AS DIRECTED EVERY 4 HOURS AS NEEDED.)  . amoxicillin-clavulanate (AUGMENTIN) 875-125 MG tablet Take 1 tablet by mouth 2 (two) times daily.  [name] Kitchen aspirin 81 MG tablet Take 81 mg by mouth daily.  . Fluticasone-Salmeterol (ADVAIR DISKUS) 500-50 MCG/DOSE AEPB Inhale 1 puff into the lungs 2 (two) times daily. INHALE 1 PUFF BY MOUTH TWICE A DAY. RINSE MOUTH AFTER USE. (Patient taking differently: Inhale 1 puff into the lungs 2 (two) times daily. )  . ipratropium (ATROVENT) 0.02 % nebulizer solution Take 2.5 mLs (0.5 mg total) by nebulization every 6 (six) hours as needed for wheezing or shortness of breath.  . Ipratropium-Albuterol (COMBIVENT) 20-100 MCG/ACT AERS respimat One puff into lungs every 6 hours  (Patient taking differently: Inhale 1 puff into the lungs every 6 (six) hours. )  . pravastatin (PRAVACHOL) 40 MG tablet TAKE (1) TABLET BY MOUTH AT BEDTIME. (Patient taking differently: Take 40 mg by mouth every evening. )  . predniSONE (DELTASONE) 50 MG tablet Take 1 tablet (50 mg total) by mouth daily with breakfast.  . PROAIR HFA 108 (90 Base) MCG/ACT inhaler Inhale 2 puffs into the lungs every 6 (six) hours as needed for wheezing or shortness of breath.  . triamterene-hydrochlorothiazide (MAXZIDE-25) 37.5-25 MG tablet TAKE 1 & 1/2 TABLETS BY MOUTH ONCE DAILY. (Patient taking differently: Take 1.5 tablets by mouth every morning. )  . [DISCONTINUED] simvastatin (ZOCOR) 40 MG tablet Take 40 mg by mouth daily.   No facility-administered encounter medications on file as of 12/18/2018.  No Known Allergies  Review of Systems  Constitutional: Negative.   HENT: Negative.   Eyes: Negative.   Respiratory: Negative.   Cardiovascular: Negative.   Gastrointestinal: Negative.   Endocrine: Negative.   Genitourinary: Negative.   Musculoskeletal: Negative.   Skin: Negative.   Allergic/Immunologic: Negative.   Neurological: Negative.   Hematological: Negative.   Psychiatric/Behavioral: Negative.   All other systems reviewed and are negative.      Objective:     BP (!) 148/87 (BP Location: Left Arm, Patient Position: Sitting, Cuff Size: Normal)   Pulse 87   Temp 98.7 F (37.1 C) (Oral)   Ht 5\' 7"  (1.702 m)   Wt 131 lb 3.2 oz (59.5 kg)   SpO2 97%   BMI 20.55 kg/m   Physical Exam Vitals and nursing note reviewed.  Constitutional:      Appearance: Normal appearance. He is well-developed, well-groomed and normal weight.  HENT:     Head: Normocephalic and atraumatic.     Right Ear: External ear normal.     Left Ear: External ear normal.     Nose: Nose normal.     Mouth/Throat:     Mouth: Mucous membranes are moist.     Pharynx: Oropharynx is clear.  Eyes:     General:         Right eye: No discharge.        Left eye: No discharge.     Conjunctiva/sclera: Conjunctivae normal.  Cardiovascular:     Rate and Rhythm: Normal rate and regular rhythm.     Pulses: Normal pulses.     Heart sounds: Normal heart sounds.  Pulmonary:     Effort: Pulmonary effort is normal.     Breath sounds: Decreased air movement present. Examination of the right-lower field reveals decreased breath sounds. Examination of the left-lower field reveals decreased breath sounds. Decreased breath sounds present.  Musculoskeletal:        General: Normal range of motion.     Cervical back: Normal range of motion and neck supple.  Skin:    General: Skin is warm.  Neurological:     General: No focal deficit present.     Mental Status: He is alert and oriented to person, place, and time.  Psychiatric:        Attention and Perception: Attention normal.        Mood and Affect: Mood normal.        Speech: Speech normal.        Behavior: Behavior normal. Behavior is cooperative.        Thought Content: Thought content normal.        Cognition and Memory: Cognition normal.        Judgment: Judgment normal.        Assessment and Plan       1. Encounter for support and coordination of transition of care As documented above.  Will be getting labs for repeat check.  Completion of antibiotic and prednisone course.  Has returned to predominantly baseline lung sounds.  Overall reports feeling better.  Observably much better than last time I saw him on December 3 as well.  Advised to continue all medications as directed.  And to return to the office or call the office if he has any signs and symptoms as he was having.  2. Asthma with COPD (chronic obstructive pulmonary disease) ([place]) Controlled continue current medications at this time.  3. Community acquired pneumonia of left lung, unspecified part of  lung Stable, and resolved.  No repeat chest x-ray as this takes time for it to resolve from the  x-ray.  Will repeat labs to make sure CBC is trending down. - BASIC METABOLIC PANEL WITH GFR - CBC  4. Essential hypertension Slightly elevated continue all medications as ordered.  Does not have any signs or symptoms of uncontrolled blood pressure at this time.  Follow up: 03/23/2019  Perlie Mayo, DNP, AGNP-BC [place], [place], [place] [address] Office Hours: Mon-Thurs 8 am-5 pm; Fri 8 am-12 pm Office Phone:  [phone]  Office Fax: [phone]

## 2018-12-19 LAB — CBC
HCT: 44.5 % (ref 38.5–50.0)
Hemoglobin: 14.6 g/dL (ref 13.2–17.1)
MCH: 29.5 pg (ref 27.0–33.0)
MCHC: 32.8 g/dL (ref 32.0–36.0)
MCV: 89.9 fL (ref 80.0–100.0)
MPV: 11.9 fL (ref 7.5–12.5)
Platelets: 302 10*3/uL (ref 140–400)
RBC: 4.95 10*6/uL (ref 4.20–5.80)
RDW: 12.7 % (ref 11.0–15.0)
WBC: 5.5 10*3/uL (ref 3.8–10.8)

## 2018-12-19 LAB — BASIC METABOLIC PANEL WITH GFR
BUN: 22 mg/dL (ref 7–25)
CO2: 32 mmol/L (ref 20–32)
Calcium: 9.6 mg/dL (ref 8.6–10.3)
Chloride: 98 mmol/L (ref 98–110)
Creat: 1 mg/dL (ref 0.70–1.18)
GFR, Est African American: 88 mL/min/{1.73_m2} (ref >=60)
GFR, Est Non African American: 76 mL/min/{1.73_m2} (ref >=60)
Glucose, Bld: 108 mg/dL — ABNORMAL HIGH (ref 65–99)
Potassium: 4.7 mmol/L (ref 3.5–5.3)
Sodium: 140 mmol/L (ref 135–146)

## 2018-12-23 ENCOUNTER — Ambulatory Visit: Payer: Self-pay

## 2018-12-28 ENCOUNTER — Other Ambulatory Visit: Payer: Self-pay

## 2018-12-28 ENCOUNTER — Encounter: Payer: Self-pay | Admitting: Family Medicine

## 2018-12-28 ENCOUNTER — Ambulatory Visit (INDEPENDENT_AMBULATORY_CARE_PROVIDER_SITE_OTHER): Payer: Medicare Other | Admitting: Family Medicine

## 2018-12-28 VITALS — BP 134/80 | HR 122 | Temp 98.3°F | Resp 15 | Ht 67.0 in | Wt 134.0 lb

## 2018-12-28 DIAGNOSIS — J449 Chronic obstructive pulmonary disease, unspecified: Secondary | ICD-10-CM | POA: Diagnosis not present

## 2018-12-28 DIAGNOSIS — I10 Essential (primary) hypertension: Secondary | ICD-10-CM | POA: Diagnosis not present

## 2018-12-28 DIAGNOSIS — R000 Tachycardia, unspecified: Secondary | ICD-10-CM

## 2018-12-28 MED ORDER — PREDNISONE 10 MG (21) PO TBPK: ORAL_TABLET | ORAL | 0 refills | Status: DC

## 2018-12-28 NOTE — Assessment & Plan Note (Signed)
Controlled, continue current medication 

## 2018-12-28 NOTE — Progress Notes (Signed)
Subjective:  Patient ID: [name], male    DOB: 1950-04-15  Age: 68 y.o. [name]: [id number]  CC:  Chief Complaint  Patient presents with   COPD    pt states he is "breathing kind of hard" it started this morning.      HPI  [name] is a 68 year old male patient of Dr. Griffin Dakin.  He presents today due to ongoing shortness of breath over the last several days.  Reports that his inhalers are not doing what they usually do to get rid of the shortness of breath.  He reports that he will get some relief but then it comes right back.  He denies having any feelings that he did most recently when he was sent to the emergency room and found to have pneumonia.  He reports that he completed all of his antibiotic and the prednisone they provide to him in the hospital.  He reports he is not drinking as much water as he could.  Eating his normal amount.  Denies having any recent contacts that have been eating illness.  Denies any exposure to Covid.  Denies having any headaches, dizziness, chest pain, palpitations.  Cough and shortness of breath are at baseline of what he normally happens after the shortness of breath is occurring more frequently.  Denies having any difficulty breathing or getting air in.  Denies having any mucus production by nasal or cough.  COPD He complains of cough and shortness of breath. There is no chest tightness, difficulty breathing, frequent throat clearing, hemoptysis, hoarse voice, sputum production or wheezing. This is a chronic problem. The current episode started more than 1 year ago. The problem occurs 2 to 4 times per day. The problem has been unchanged. The cough is non-productive. Pertinent negatives include no appetite change, chest pain, dyspnea on exertion, ear congestion, ear pain, fever, headaches, heartburn, malaise/fatigue, myalgias, nasal congestion, orthopnea, PND, postnasal drip, rhinorrhea, sneezing, sore throat, sweats, trouble swallowing or weight  loss. His symptoms are aggravated by minimal activity. His symptoms are alleviated by ipratropium and steroid inhaler (short time relief with inhalers). He reports minimal improvement on treatment. There are no known risk factors for lung disease. His past medical history is significant for COPD.    Today patient denies signs and symptoms of COVID 19 infection including fever, chills, cough, shortness of breath, and headache. Past Medical, Surgical, Social History, Allergies, and Medications have been Reviewed.   Past Medical History:  Diagnosis Date   Asthma    COPD (chronic obstructive pulmonary disease) ([place])    CVA (cerebral vascular accident) ([place]) 2008   with temporary vision loss    Depression    Elevated PSA 2014   no diagnosis pf prostate cancer in 07/2014   History of substance abuse ([place]) 01/14/2011   Marijuana   History of tobacco abuse 01/14/2011   Hyperlipidemia    Hypertension    Marijuana abuse    Nicotine addiction    Prediabetes 2014    Current Meds  Medication Sig   albuterol (PROVENTIL) (2.5 MG/3ML) 0.083% nebulizer solution TAKE 1 AMPULE (3 MLS) BY NEBULIZATION AS DIRECTED EVERY 4 HOURS AS NEEDED. (Patient taking differently: Take 2.5 mg by nebulization every 6 (six) hours as needed for wheezing or shortness of breath. TAKE 1 AMPULE (3 MLS) BY NEBULIZATION AS DIRECTED EVERY 4 HOURS AS NEEDED.)   amoxicillin-clavulanate (AUGMENTIN) 875-125 MG tablet Take 1 tablet by mouth 2 (two) times daily.   aspirin 81 MG  tablet Take 81 mg by mouth daily.   Fluticasone-Salmeterol (ADVAIR DISKUS) 500-50 MCG/DOSE AEPB Inhale 1 puff into the lungs 2 (two) times daily. INHALE 1 PUFF BY MOUTH TWICE A DAY. RINSE MOUTH AFTER USE. (Patient taking differently: Inhale 1 puff into the lungs 2 (two) times daily. )   ipratropium (ATROVENT) 0.02 % nebulizer solution Take 2.5 mLs (0.5 mg total) by nebulization every 6 (six) hours as needed for wheezing or shortness of breath.    Ipratropium-Albuterol (COMBIVENT) 20-100 MCG/ACT AERS respimat One puff into lungs every 6 hours (Patient taking differently: Inhale 1 puff into the lungs every 6 (six) hours. )   pravastatin (PRAVACHOL) 40 MG tablet TAKE (1) TABLET BY MOUTH AT BEDTIME.   PROAIR HFA 108 (90 Base) MCG/ACT inhaler Inhale 2 puffs into the lungs every 6 (six) hours as needed for wheezing or shortness of breath.   triamterene-hydrochlorothiazide (MAXZIDE-25) 37.5-25 MG tablet TAKE 1 & 1/2 TABLETS BY MOUTH ONCE DAILY.   [DISCONTINUED] predniSONE (DELTASONE) 50 MG tablet Take 1 tablet (50 mg total) by mouth daily with breakfast.    ROS:  Review of Systems  Constitutional: Negative for appetite change, fever, malaise/fatigue and weight loss.  HENT: Negative for ear pain, hoarse voice, postnasal drip, rhinorrhea, sneezing, sore throat and trouble swallowing.   Eyes: Negative.   Respiratory: Positive for cough and shortness of breath. Negative for hemoptysis, sputum production and wheezing.   Cardiovascular: Negative for chest pain, dyspnea on exertion and PND.  Gastrointestinal: Negative for heartburn.  Genitourinary: Negative.   Musculoskeletal: Negative for myalgias.  Skin: Negative.   Neurological: Negative for headaches.  Endo/Heme/Allergies: Negative.   Psychiatric/Behavioral: Negative.   All other systems reviewed and are negative.    Objective:   Today's Vitals: BP 134/80    Pulse (!) 122    Temp 98.3 F (36.8 C) (Oral)    Resp 15    Ht 5\' 7"  (1.702 m)    Wt 134 lb 0.6 oz (60.8 kg)    SpO2 95%    BMI 20.99 kg/m  Vitals with BMI 12/28/2018 12/18/2018 12/13/2018  Height 5\' 7"  5\' 7"  -  Weight 134 lbs 1 oz 131 lbs 3 oz 127 lbs 3 oz  BMI 20.99 99991111 [id number]  Systolic [id number]  Diastolic 80 87 63  Pulse [id number] 87 80     Physical Exam Vitals and nursing note reviewed.  Constitutional:      Appearance: Normal appearance. He is well-developed, well-groomed and normal weight.  HENT:     Head:  Normocephalic and atraumatic.     Right Ear: External ear normal.     Left Ear: External ear normal.     Nose: Nose normal.     Mouth/Throat:     Mouth: Mucous membranes are moist.     Pharynx: Oropharynx is clear.  Eyes:     General:        Right eye: No discharge.        Left eye: No discharge.     Conjunctiva/sclera: Conjunctivae normal.  Cardiovascular:     Rate and Rhythm: Normal rate and regular rhythm.     Pulses: Normal pulses.     Heart sounds: Normal heart sounds.  Pulmonary:     Effort: Pulmonary effort is normal.     Breath sounds: Decreased air movement present. Decreased breath sounds present.  Musculoskeletal:        General: Normal range of motion.     Cervical back: Normal range of  motion and neck supple.  Skin:    General: Skin is warm.  Neurological:     General: No focal deficit present.     Mental Status: He is alert and oriented to person, place, and time.  Psychiatric:        Attention and Perception: Attention normal.        Mood and Affect: Mood normal.        Speech: Speech normal.        Behavior: Behavior normal. Behavior is cooperative.        Thought Content: Thought content normal.        Cognition and Memory: Cognition normal.        Judgment: Judgment normal.      Assessment   1. Asthma with COPD (chronic obstructive pulmonary disease) ([place])   2. Essential hypertension     Tests ordered No orders of the defined types were placed in this encounter.  Plan: Please see assessment and plan per problem list above.   Meds ordered this encounter  Medications   predniSONE (STERAPRED UNI-PAK 21 TAB) 10 MG (21) TBPK tablet    Sig: Take as directed on the package.    Dispense:  21 tablet    Refill:  0    Order Specific Question:   Supervising Provider    Answer:   Fayrene Helper [id number]    Patient to follow-up in as scheduled   Perlie Mayo, NP

## 2018-12-28 NOTE — Assessment & Plan Note (Signed)
On-going ST. Possibly related to inhaler use. Concerned for need to adjust inhalers due to on going pred use. He denies feeling dizzy, lightheaded, with headaches or any other signs or symptoms at this time of blood pressure/chest pain/elevated heart rate.  Provided education on this symptom.Patient acknowledged agreement and understanding of the plan.

## 2018-12-28 NOTE — Patient Instructions (Signed)
  I appreciate the opportunity to provide you with care for your health and wellness. Today we discussed: Increase Shortness of breath  Follow up: 03/23/2019  No labs or referrals today  Please take Prednisone as directed. Call us if not better after finishing it. Please go to ED if symptoms get worse.  I am going to review inhalers and talk with Dr Moshe Cipro, we might need to adjust your inhalers to see if we can improve the constant need oral prednisone.   I hope you have a wonderful, happy, safe, and healthy Holiday Season! See you in the New Year :)  Please continue to practice social distancing to keep you, your family, and our community safe.  If you must go out, please wear a mask and practice good handwashing.  It was a pleasure to see you and I look forward to continuing to work together on your health and well-being. Please do not hesitate to call the office if you need care or have questions about your care.  Have a wonderful day and week. With Gratitude, [name], DNP, AGNP-BC

## 2018-12-28 NOTE — Assessment & Plan Note (Addendum)
Not well controlled.  Requires multiple oral prednisone dosing over the last several months.  Has reduced air entry noted bilaterally.  Concern for possible need of adjustment of medications.  Will be discussing with Dr. Moshe Cipro best next move.  Would like to refrain from ongoing prednisone use as much as he has had over the last several months. Reviewed side effects, risks and benefits of medication.   Patient acknowledged agreement and understanding of the plan.  Continue all current medications and inhalers at this time.

## 2019-01-11 ENCOUNTER — Other Ambulatory Visit: Payer: Self-pay | Admitting: Family Medicine

## 2019-01-11 DIAGNOSIS — R69 Illness, unspecified: Secondary | ICD-10-CM | POA: Diagnosis not present

## 2019-01-27 ENCOUNTER — Telehealth: Payer: Self-pay

## 2019-01-27 ENCOUNTER — Other Ambulatory Visit: Payer: Self-pay

## 2019-01-27 DIAGNOSIS — R69 Illness, unspecified: Secondary | ICD-10-CM | POA: Diagnosis not present

## 2019-01-27 DIAGNOSIS — J449 Chronic obstructive pulmonary disease, unspecified: Secondary | ICD-10-CM

## 2019-01-27 MED ORDER — IPRATROPIUM BROMIDE 0.02 % IN SOLN: 0.5000 mg | Freq: Four times a day (QID) | RESPIRATORY_TRACT | 5 refills | Status: DC | PRN

## 2019-01-27 NOTE — Telephone Encounter (Signed)
Med refill sent in.

## 2019-01-27 NOTE — Telephone Encounter (Signed)
Please call the medication ipratropium (ATROVENT) 0.02 % nebulizer solution to [facility]

## 2019-02-02 ENCOUNTER — Encounter: Payer: Self-pay | Admitting: Family Medicine

## 2019-02-02 ENCOUNTER — Ambulatory Visit (INDEPENDENT_AMBULATORY_CARE_PROVIDER_SITE_OTHER): Payer: Medicare HMO | Admitting: Family Medicine

## 2019-02-02 ENCOUNTER — Other Ambulatory Visit: Payer: Self-pay

## 2019-02-02 VITALS — Ht 67.0 in | Wt 135.0 lb

## 2019-02-02 DIAGNOSIS — J441 Chronic obstructive pulmonary disease with (acute) exacerbation: Secondary | ICD-10-CM

## 2019-02-02 DIAGNOSIS — J449 Chronic obstructive pulmonary disease, unspecified: Secondary | ICD-10-CM | POA: Diagnosis not present

## 2019-02-02 MED ORDER — CEFDINIR 300 MG PO CAPS: 300.0000 mg | ORAL_CAPSULE | Freq: Two times a day (BID) | ORAL | 0 refills | Status: AC

## 2019-02-02 NOTE — Progress Notes (Signed)
Virtual Visit via Telephone Note   This visit type was conducted due to national recommendations for restrictions regarding the COVID-19 Pandemic (e.g. social distancing) in an effort to limit this patient's exposure and mitigate transmission in our community.  Due to his co-morbid illnesses, this patient is at least at moderate risk for complications without adequate follow up.  This format is felt to be most appropriate for this patient at this time.  The patient did not have access to video technology/had technical difficulties with video requiring transitioning to audio format only (telephone).  All issues noted in this document were discussed and addressed.  No physical exam could be performed with this format.   Evaluation Performed:  Follow-up visit  Date:  02/02/2019   ID:  [name], DOB Mar 24, 1950, [name] [id number]  Patient Location: Home Provider Location: Office  Location of Patient: Home Location of Provider: Telehealth Consent was obtain for visit to be over via telehealth. I verified that I am speaking with the correct person using two identifiers.  PCP:  Fayrene Helper, MD   Chief Complaint:  Chest cold  History of Present Illness:     [name] is a 69 year old patient of Dr. Moshe Cipro.  Well-known to the clinic.  Presents today with onset of cough, with green phlegm.  Reports that he had chest discomfort on Sunday as he stopped some now.  Denies having any chest pain or palpitations.  Reports that he is still coughing up green phlegm.  Denies having any fevers, chills, nausea, vomiting, shortness of breath outside of his baseline COPD but no shortness of breath.  Denies exposure to Covid.  Recent use of antibiotics back in December concern for possible COPD exacerbation phone visit limited PE.    The patient does not have symptoms concerning for COVID-19 infection (fever, chills, cough, or new shortness of breath).   Past Medical, Surgical, Social History,  Allergies, and Medications have been Reviewed.  Past Medical History:  Diagnosis Date  . Asthma   . COPD (chronic obstructive pulmonary disease) ([place])   . CVA (cerebral vascular accident) ([place]) 2008   with temporary vision loss   . Depression   . ECHOCARDIOGRAM, ABNORMAL 03/06/2010   Qualifier: Diagnosis of  By: Purcell Nails, NP, Curt Bears    . Elevated PSA 2014   no diagnosis pf prostate cancer in 07/2014  . History of substance abuse ([place]) 01/14/2011   Marijuana  . History of substance abuse ([place]) 01/14/2011  . History of tobacco abuse 01/14/2011  . Hyperlipidemia   . Hypertension   . Marijuana abuse   . Nicotine addiction   . Prediabetes 2014   No past surgical history on file.   Current Meds  Medication Sig  . albuterol (PROVENTIL) (2.5 MG/3ML) 0.083% nebulizer solution INHALE 1 VIAL VIA NEBULIZER EVERY 4 HOURS AS NEEDED.  [name] Kitchen aspirin 81 MG tablet Take 81 mg by mouth daily.  . Fluticasone-Salmeterol (ADVAIR DISKUS) 500-50 MCG/DOSE AEPB Inhale 1 puff into the lungs 2 (two) times daily. INHALE 1 PUFF BY MOUTH TWICE A DAY. RINSE MOUTH AFTER USE. (Patient taking differently: Inhale 1 puff into the lungs 2 (two) times daily. )  . ipratropium (ATROVENT) 0.02 % nebulizer solution Take 2.5 mLs (0.5 mg total) by nebulization every 6 (six) hours as needed for wheezing or shortness of breath.  . Ipratropium-Albuterol (COMBIVENT) 20-100 MCG/ACT AERS respimat One puff into lungs every 6 hours (Patient taking differently: Inhale 1 puff into the lungs every 6 (six)  hours. )  . pravastatin (PRAVACHOL) 40 MG tablet TAKE (1) TABLET BY MOUTH AT BEDTIME.  [name] Kitchen PROAIR HFA 108 (90 Base) MCG/ACT inhaler Inhale 2 puffs into the lungs every 6 (six) hours as needed for wheezing or shortness of breath.  . triamterene-hydrochlorothiazide (MAXZIDE-25) 37.5-25 MG tablet TAKE 1 & 1/2 TABLETS BY MOUTH ONCE DAILY.  . [DISCONTINUED] amoxicillin-clavulanate (AUGMENTIN) 875-125 MG tablet Take 1 tablet by mouth 2 (two) times daily.      Allergies:   Patient has no known allergies.   ROS:   Please see the history of present illness.    All other systems reviewed and are negative.   Labs/Other Tests and Data Reviewed:    Recent Labs: 04/19/2018: TSH 0.396 12/10/2018: B Natriuretic Peptide 18.0 12/11/2018: ALT 25; Magnesium 2.1 12/18/2018: BUN 22; Creat 1.00; Hemoglobin 14.6; Platelets 302; Potassium 4.7; Sodium 140   Recent Lipid Panel Lab Results  Component Value Date/Time   CHOL 189 11/12/2018 10:09 AM   TRIG 51 11/12/2018 10:09 AM   HDL 83 11/12/2018 10:09 AM   CHOLHDL 2.3 11/12/2018 10:09 AM   LDLCALC 93 11/12/2018 10:09 AM    Wt Readings from Last 3 Encounters:  02/02/19 135 lb (61.2 kg)  12/28/18 134 lb 0.6 oz (60.8 kg)  12/18/18 131 lb 3.2 oz (59.5 kg)     Objective:    Vital Signs:  Ht 5\' 7"  (1.702 m)   Wt 135 lb (61.2 kg)   BMI 21.14 kg/m    GEN:  alert and oriented RESPIRATORY:  Baseline shortness of breath able to communicate in complete sentences for the most part, mild cough noted on phone PSYCH:  Normal affect, at baseline good communication.  ASSESSMENT & PLAN:   1. COPD with acute exacerbation (HCC)  - cefdinir (OMNICEF) 300 MG capsule; Take 1 capsule (300 mg total) by mouth 2 (two) times daily for 3 days.  Dispense: 6 capsule; Refill: 0   Time:   Today, I have spent 10 minutes with the patient with telehealth technology discussing the above problems.     Medication Adjustments/Labs and Tests Ordered: Current medicines are reviewed at length with the patient today.  Concerns regarding medicines are outlined above.   Tests Ordered: No orders of the defined types were placed in this encounter.   Medication Changes: Meds ordered this encounter  Medications  . cefdinir (OMNICEF) 300 MG capsule    Sig: Take 1 capsule (300 mg total) by mouth 2 (two) times daily for 3 days.    Dispense:  6 capsule    Refill:  0    Order Specific Question:   Supervising Provider    Answer:    Fayrene Helper [id number]    Disposition:  Follow up 03/23/2019  Signed, Perlie Mayo, NP  02/02/2019 10:49 AM     [place]

## 2019-02-02 NOTE — Assessment & Plan Note (Signed)
He is previously not been well controlled.  Has had oral prednisone over the last several months.  Refrain from doing prednisone at the end of December.  Presents now with green phlegm and possible exacerbation of COPD versus chest cold.  Due to recent use of antibiotic will opt for a different class of antibiotics with Omnicef.  Advised him to follow-up if this does not clear up his mucus and improve his cough.  Additionally provided with information on when to go to the emergency room in case this is more severe than what I can assess over the phone.side Patient acknowledged agreement and understanding of the plan.   Reviewed side effects, risks and benefits of medication.

## 2019-02-02 NOTE — Patient Instructions (Signed)
Happy New Year! May you have a year filled with hope, love, happiness and laughter.  I appreciate the opportunity to provide you with care for your health and wellness. Today we discussed: chest cold  Follow up: As scheduled  No labs or referrals today  Please take current antibiotic in full.  If symptoms do not subside or they worsen please call the office.  Please go to the nearest emergency room if you develop shortness of breath, continuous cough even with starting this medication use of inhalers.  Please hydrate well to help thin out secretions.  Please continue to practice social distancing to keep you, your family, and our community safe.  If you must go out, please wear a mask and practice good handwashing.  It was a pleasure to see you and I look forward to continuing to work together on your health and well-being. Please do not hesitate to call the office if you need care or have questions about your care.  Have a wonderful day and week. With Gratitude, [name], DNP, AGNP-BC

## 2019-03-05 ENCOUNTER — Ambulatory Visit: Payer: Medicare HMO | Admitting: Family Medicine

## 2019-03-05 ENCOUNTER — Other Ambulatory Visit: Payer: Self-pay

## 2019-03-05 ENCOUNTER — Emergency Department (HOSPITAL_COMMUNITY): Payer: Medicare HMO

## 2019-03-05 ENCOUNTER — Encounter (HOSPITAL_COMMUNITY): Payer: Self-pay | Admitting: *Deleted

## 2019-03-05 ENCOUNTER — Emergency Department (HOSPITAL_COMMUNITY)
Admission: EM | Admit: 2019-03-05 | Discharge: 2019-03-05 | Disposition: A | Discharge status: Home / Self Care | Payer: Medicare HMO | Attending: Emergency Medicine | Admitting: Emergency Medicine

## 2019-03-05 DIAGNOSIS — Z87891 Personal history of nicotine dependence: Secondary | ICD-10-CM | POA: Diagnosis not present

## 2019-03-05 DIAGNOSIS — J441 Chronic obstructive pulmonary disease with (acute) exacerbation: Secondary | ICD-10-CM

## 2019-03-05 DIAGNOSIS — R0602 Shortness of breath: Secondary | ICD-10-CM | POA: Diagnosis not present

## 2019-03-05 DIAGNOSIS — Z79899 Other long term (current) drug therapy: Secondary | ICD-10-CM | POA: Diagnosis not present

## 2019-03-05 DIAGNOSIS — I10 Essential (primary) hypertension: Secondary | ICD-10-CM | POA: Insufficient documentation

## 2019-03-05 DIAGNOSIS — Z20822 Contact with and (suspected) exposure to covid-19: Secondary | ICD-10-CM | POA: Diagnosis not present

## 2019-03-05 DIAGNOSIS — E119 Type 2 diabetes mellitus without complications: Secondary | ICD-10-CM | POA: Diagnosis not present

## 2019-03-05 LAB — COMPREHENSIVE METABOLIC PANEL
ALT: 17 U/L (ref 0–44)
AST: 23 U/L (ref 15–41)
Albumin: 4.1 g/dL (ref 3.5–5.0)
Alkaline Phosphatase: 59 U/L (ref 38–126)
Anion gap: 9 (ref 5–15)
BUN: 11 mg/dL (ref 8–23)
CO2: 33 mmol/L — ABNORMAL HIGH (ref 22–32)
Calcium: 9.4 mg/dL (ref 8.9–10.3)
Chloride: 100 mmol/L (ref 98–111)
Creatinine, Ser: 0.84 mg/dL (ref 0.61–1.24)
GFR calc Af Amer: >60 mL/min (ref >60)
GFR calc non Af Amer: >60 mL/min (ref >60)
Glucose, Bld: 143 mg/dL — ABNORMAL HIGH (ref 70–99)
Potassium: 3.8 mmol/L (ref 3.5–5.1)
Sodium: 142 mmol/L (ref 135–145)
Total Bilirubin: 0.4 mg/dL (ref 0.3–1.2)
Total Protein: 7 g/dL (ref 6.5–8.1)

## 2019-03-05 LAB — CBC WITH DIFFERENTIAL/PLATELET
Abs Immature Granulocytes: 0.03 10*3/uL (ref 0.00–0.07)
Basophils Absolute: 0 10*3/uL (ref 0.0–0.1)
Basophils Relative: 1 %
Eosinophils Absolute: 0.3 10*3/uL (ref 0.0–0.5)
Eosinophils Relative: 4 %
HCT: 42.2 % (ref 39.0–52.0)
Hemoglobin: 12.8 g/dL — ABNORMAL LOW (ref 13.0–17.0)
Immature Granulocytes: 1 %
Lymphocytes Relative: 47 %
Lymphs Abs: 3.1 10*3/uL (ref 0.7–4.0)
MCH: 29.3 pg (ref 26.0–34.0)
MCHC: 30.3 g/dL (ref 30.0–36.0)
MCV: 96.6 fL (ref 80.0–100.0)
Monocytes Absolute: 0.7 10*3/uL (ref 0.1–1.0)
Monocytes Relative: 10 %
Neutro Abs: 2.4 10*3/uL (ref 1.7–7.7)
Neutrophils Relative %: 37 %
Platelets: 225 10*3/uL (ref 150–400)
RBC: 4.37 MIL/uL (ref 4.22–5.81)
RDW: 13.3 % (ref 11.5–15.5)
WBC: 6.5 10*3/uL (ref 4.0–10.5)
nRBC: 0 % (ref 0.0–0.2)

## 2019-03-05 MED ORDER — METHYLPREDNISOLONE SODIUM SUCC 125 MG IJ SOLR
125.0000 mg | Freq: Once | INTRAMUSCULAR | Status: AC
  Administered 2019-03-05: 125 mg via INTRAVENOUS
  Filled 2019-03-05: qty 2

## 2019-03-05 MED ORDER — IPRATROPIUM-ALBUTEROL 20-100 MCG/ACT IN AERS
INHALATION_SPRAY | RESPIRATORY_TRACT | Status: AC
  Administered 2019-03-05: 12:00:00 1 via RESPIRATORY_TRACT
  Filled 2019-03-05: qty 4

## 2019-03-05 MED ORDER — ALBUTEROL SULFATE HFA 108 (90 BASE) MCG/ACT IN AERS: 6.0000 | INHALATION_SPRAY | Freq: Once | RESPIRATORY_TRACT | Status: DC

## 2019-03-05 MED ORDER — PREDNISONE 50 MG PO TABS: 50.0000 mg | ORAL_TABLET | Freq: Every day | ORAL | 0 refills | Status: DC

## 2019-03-05 MED ORDER — IPRATROPIUM BROMIDE HFA 17 MCG/ACT IN AERS: 4.0000 | INHALATION_SPRAY | Freq: Once | RESPIRATORY_TRACT | Status: DC

## 2019-03-05 MED ORDER — MAGNESIUM SULFATE 2 GM/50ML IV SOLN
2.0000 g | Freq: Once | INTRAVENOUS | Status: AC
  Administered 2019-03-05: 2 g via INTRAVENOUS
  Filled 2019-03-05: qty 50

## 2019-03-05 MED ORDER — IPRATROPIUM-ALBUTEROL 20-100 MCG/ACT IN AERS: 1.0000 | INHALATION_SPRAY | Freq: Once | RESPIRATORY_TRACT | Status: AC

## 2019-03-05 NOTE — Discharge Instructions (Addendum)
Continue taking home medications as prescribed, especially your albuterol. Take prednisone as prescribed starting tomorrow. Follow-up with your primary care doctor next week for recheck. Return to emergency room if you develop high fevers, increased difficulty breathing despite medications, or any new, worsening, or concerning symptoms.

## 2019-03-05 NOTE — ED Provider Notes (Signed)
  Face-to-face evaluation   History: He presents for evaluation of shortness of breath, similar to prior episodes of COPD exacerbation.  He states the last one was "last year."  Physical exam: Alert, calm and cooperative.  No respiratory distress.  Lungs with diminished air movement bilaterally, scattered rhonchi but no audible wheezing.  There is no increased work of breathing.  Heart regular rate and rhythm without murmur.  Medical screening examination/treatment/procedure(s) were conducted as a shared visit with non-physician practitioner(s) and myself.  I personally evaluated the patient during the encounter    Daleen Bo, MD 03/08/19 [id number]

## 2019-03-05 NOTE — ED Triage Notes (Signed)
Pt sent by Dr. Moshe Cipro for sob and elevated HR. Hx of asthma

## 2019-03-05 NOTE — ED Provider Notes (Signed)
[facility] EMERGENCY DEPARTMENT Provider Note   CSN: [id number] Arrival date & time: 03/05/19  1106     History Chief Complaint  Patient presents with  . Shortness of Breath    [name] is a 69 y.o. male presenting for evaluation of SOB.   Pt states he developed worsening shortness of breath today.  It feels like his typical COPD exacerbation.  He used his home inhalers without improvement of symptoms.  It was recommended he come to the ER for further evaluation.  He denies fevers, chills, chest pain, cough, nausea, vomiting, abd pain, urinary symptoms..  He denies recent change in medications.  He received his first Covid vaccine 3 days ago.  HPI     Past Medical History:  Diagnosis Date  . Asthma   . COPD (chronic obstructive pulmonary disease) ([place])   . CVA (cerebral vascular accident) ([place]) 2008   with temporary vision loss   . Depression   . ECHOCARDIOGRAM, ABNORMAL 03/06/2010   Qualifier: Diagnosis of  By: Purcell Nails, NP, Curt Bears    . Elevated PSA 2014   no diagnosis pf prostate cancer in 07/2014  . History of substance abuse ([place]) 01/14/2011   Marijuana  . History of substance abuse ([place]) 01/14/2011  . History of tobacco abuse 01/14/2011  . Hyperlipidemia   . Hypertension   . Marijuana abuse   . Nicotine addiction   . Prediabetes 2014    Patient Active Problem List   Diagnosis Date Noted  . Lobar pneumonia ([place]) 12/13/2018  . Acute respiratory failure with hypoxia ([place]) 12/13/2018  . CAP (community acquired pneumonia) 12/10/2018  . Food insecurity 11/22/2018  . Shortness of breath 04/18/2018  . COPD with acute exacerbation ([place]) 04/18/2018  . Glucose intolerance 04/18/2018  . Social isolation 03/17/2013  . Asthma with COPD (chronic obstructive pulmonary disease) ([place]) 11/12/2012  . Tachycardia 11/05/2010  . Diabetes mellitus type 2, controlled ([place]) 10/26/2009  . Vitamin D deficiency 06/28/2009  . IGT (impaired glucose tolerance) 01/25/2009  . PSA,  INCREASED 01/25/2009  . Hyperlipidemia 07/30/2007  . Essential hypertension 07/30/2007    History reviewed. No pertinent surgical history.     History reviewed. No pertinent family history.  Social History   Tobacco Use  . Smoking status: Former Smoker    Packs/day: 0.50    Years: 40.00    Pack years: 20.00    Types: Cigarettes    Quit date: 04/14/2015    Years since quitting: 3.8  . Smokeless tobacco: Never Used  Substance Use Topics  . Alcohol use: Yes    Alcohol/week: 3.0 standard drinks    Types: 3 Cans of beer per week    Comment: occasionally  . Drug use: No    Comment: pt quit 20 years ago    Home Medications Prior to Admission medications   Medication Sig Start Date End Date Taking? Authorizing Provider  albuterol (PROVENTIL) (2.5 MG/3ML) 0.083% nebulizer solution INHALE 1 VIAL VIA NEBULIZER EVERY 4 HOURS AS NEEDED. 01/11/19  Yes Fayrene Helper, MD  aspirin 81 MG tablet Take 81 mg by mouth daily.   Yes [provider]  Fluticasone-Salmeterol (ADVAIR DISKUS) 500-50 MCG/DOSE AEPB Inhale 1 puff into the lungs 2 (two) times daily. INHALE 1 PUFF BY MOUTH TWICE A DAY. RINSE MOUTH AFTER USE. Patient taking differently: Inhale 1 puff into the lungs 2 (two) times daily.  10/20/18  Yes Perlie Mayo, NP  ipratropium (ATROVENT) 0.02 % nebulizer solution Take 2.5 mLs (0.5 mg  total) by nebulization every 6 (six) hours as needed for wheezing or shortness of breath. 01/27/19  Yes Fayrene Helper, MD  pravastatin (PRAVACHOL) 40 MG tablet TAKE (1) TABLET BY MOUTH AT BEDTIME. 12/21/18  Yes Fayrene Helper, MD  PROAIR HFA 108 [phone] Base) MCG/ACT inhaler Inhale 2 puffs into the lungs every 6 (six) hours as needed for wheezing or shortness of breath. 10/20/18  Yes Perlie Mayo, NP  triamterene-hydrochlorothiazide (MAXZIDE-25) 37.5-25 MG tablet TAKE 1 & 1/2 TABLETS BY MOUTH ONCE DAILY. 12/21/18  Yes Fayrene Helper, MD  Ipratropium-Albuterol (COMBIVENT) 20-100  MCG/ACT AERS respimat One puff into lungs every 6 hours Patient not taking: Reported on 03/05/2019 10/20/18   Perlie Mayo, NP  predniSONE (DELTASONE) 50 MG tablet Take 1 tablet (50 mg total) by mouth daily for 5 days. 03/06/19 03/11/19  Curry Dulski, PA-C  simvastatin (ZOCOR) 40 MG tablet Take 40 mg by mouth daily. 10/02/10 03/18/11  Fayrene Helper, MD    Allergies    Patient has no known allergies.  Review of Systems   Review of Systems  Respiratory: Positive for shortness of breath.   All other systems reviewed and are negative.   Physical Exam Updated Vital Signs BP 137/72   Pulse 78   Temp 97.6 F (36.4 C) (Oral)   Resp 20   Ht 5\' 7"  (1.702 m)   Wt 61.2 kg   SpO2 93%   BMI 21.14 kg/m   Physical Exam Vitals and nursing note reviewed.  Constitutional:      General: He is not in acute distress.    Appearance: He is underweight.     Comments: Elderly male, very thin, appears chronically ill  HENT:     Head: Normocephalic and atraumatic.  Eyes:     Extraocular Movements: Extraocular movements intact.     Conjunctiva/sclera: Conjunctivae normal.     Pupils: Pupils are equal, round, and reactive to light.  Cardiovascular:     Rate and Rhythm: Regular rhythm. Tachycardia present.     Pulses: Normal pulses.     Comments: Tachycardic around 110 Pulmonary:     Effort: Tachypnea present. No respiratory distress.     Breath sounds: Decreased air movement present.     Comments: Mildly increased work of breathing.  Speaking in short sentences.  Extremely diminished breath sounds throughout. Abdominal:     General: There is no distension.     Palpations: Abdomen is soft. There is no mass.     Tenderness: There is no abdominal tenderness. There is no guarding or rebound.  Musculoskeletal:        General: Normal range of motion.     Cervical back: Normal range of motion and neck supple.  Skin:    General: Skin is warm and dry.     Capillary Refill: Capillary refill  takes less than 2 seconds.  Neurological:     Mental Status: He is alert and oriented to person, place, and time.     ED Results / Procedures / Treatments   Labs (all labs ordered are listed, but only abnormal results are displayed) Labs Reviewed  CBC WITH DIFFERENTIAL/PLATELET - Abnormal; Notable for the following components:      Result Value   Hemoglobin 12.8 (*)    All other components within normal limits  COMPREHENSIVE METABOLIC PANEL - Abnormal; Notable for the following components:   CO2 33 (*)    Glucose, Bld 143 (*)    All other components within  normal limits  POC SARS CORONAVIRUS 2 AG -  ED    EKG EKG Interpretation  Date/Time:  Friday March 05 2019 13:30:53 EST Ventricular Rate:  79 PR Interval:    QRS Duration: 95 QT Interval:  393 QTC Calculation: 451 R Axis:   70 Text Interpretation: Sinus rhythm Minimal ST elevation, anterior leads Since last tracing rate slower Otherwise no significant change Confirmed by Daleen Bo [phone]) on 03/05/2019 2:58:36 PM   Radiology DG Chest Port 1 View  Result Date: 03/05/2019 CLINICAL DATA:  Shortness of breath. Tachycardia. EXAM: PORTABLE CHEST 1 VIEW COMPARISON:  One-view chest x-ray 12/10/2018 FINDINGS: The heart size is normal. Chronic changes of COPD are again noted. No focal airspace disease is present. IMPRESSION: No acute cardiopulmonary disease or significant interval change. Electronically Signed   By: San Morelle M.D.   On: 03/05/2019 11:45    Procedures Procedures (including critical care time)  Medications Ordered in ED Medications  methylPREDNISolone sodium succinate (SOLU-MEDROL) 125 mg/2 mL injection 125 mg (125 mg Intravenous Given 03/05/19 1126)  magnesium sulfate IVPB 2 g 50 mL (0 g Intravenous Stopped 03/05/19 1248)  Ipratropium-Albuterol (COMBIVENT) respimat 1 puff (1 puff Inhalation Given 03/05/19 1203)    ED Course  I have reviewed the triage vital signs and the nursing  notes.  Pertinent labs & imaging results that were available during my care of the patient were reviewed by me and considered in my medical decision making (see chart for details).    MDM Rules/Calculators/A&P                      Patient presenting for evaluation of shortness of breath.  Physical exam shows chronically ill-appearing male.  He is mildly tachycardic, tachypneic, and with slight increased work of breathing.  Extremely diminished lung sounds throughout.  Likely COPD exacerbation.  Will give Solu-Medrol, mag, and albuterol.  Chest x-ray to rule out infection.  Chest x-ray viewed interpreted by me, no pneumonia pneumothorax and effusion.  Labs interpreted by me, overall reassuring.  No leukocytosis.  Patient without fever or worsening cough, low suspicion for bacterial infection.  EKG similar to previous.  On reassessment after medication, patient reports symptoms are much improved.  He feels like he is breathing at his baseline.  He still is diminished throughout, but is no longer tachycardic, tachypneic, or with any increased work of breathing.  Will ambulate and reassess.  On ambulation, sats remained stable.  Patient states he had no worsening shortness of breath with ambulation. Case discussed with attending, Dr. Eulis Foster evaluated the pt. At this time, patient appears safe for discharge.  Return precautions given.  Patient states he understands and agrees to plan.  Final Clinical Impression(s) / ED Diagnoses Final diagnoses:  COPD exacerbation ([place])    Rx / DC Orders ED Discharge Orders         Ordered    predniSONE (DELTASONE) 50 MG tablet  Daily     03/05/19 1321           Seerat Peaden, PA-C 03/05/19 1520    Daleen Bo, MD 03/08/19 2256

## 2019-03-05 NOTE — ED Notes (Signed)
Pulse Ox stayed above 93 while ambulating

## 2019-03-11 ENCOUNTER — Encounter (HOSPITAL_COMMUNITY): Payer: Self-pay

## 2019-03-11 ENCOUNTER — Emergency Department (HOSPITAL_COMMUNITY)
Admission: EM | Admit: 2019-03-11 | Discharge: 2019-03-11 | Disposition: A | Discharge status: Home / Self Care | Payer: Medicare HMO | Attending: Emergency Medicine | Admitting: Emergency Medicine

## 2019-03-11 ENCOUNTER — Emergency Department (HOSPITAL_COMMUNITY): Payer: Medicare HMO

## 2019-03-11 ENCOUNTER — Other Ambulatory Visit: Payer: Self-pay

## 2019-03-11 ENCOUNTER — Other Ambulatory Visit: Payer: Self-pay | Admitting: Family Medicine

## 2019-03-11 DIAGNOSIS — E119 Type 2 diabetes mellitus without complications: Secondary | ICD-10-CM | POA: Insufficient documentation

## 2019-03-11 DIAGNOSIS — R000 Tachycardia, unspecified: Secondary | ICD-10-CM | POA: Diagnosis not present

## 2019-03-11 DIAGNOSIS — Z7982 Long term (current) use of aspirin: Secondary | ICD-10-CM | POA: Diagnosis not present

## 2019-03-11 DIAGNOSIS — R0789 Other chest pain: Secondary | ICD-10-CM | POA: Diagnosis not present

## 2019-03-11 DIAGNOSIS — Z20822 Contact with and (suspected) exposure to covid-19: Secondary | ICD-10-CM | POA: Diagnosis not present

## 2019-03-11 DIAGNOSIS — J441 Chronic obstructive pulmonary disease with (acute) exacerbation: Secondary | ICD-10-CM | POA: Diagnosis not present

## 2019-03-11 DIAGNOSIS — Z87891 Personal history of nicotine dependence: Secondary | ICD-10-CM | POA: Diagnosis not present

## 2019-03-11 DIAGNOSIS — Z79899 Other long term (current) drug therapy: Secondary | ICD-10-CM | POA: Diagnosis not present

## 2019-03-11 DIAGNOSIS — I10 Essential (primary) hypertension: Secondary | ICD-10-CM | POA: Diagnosis not present

## 2019-03-11 DIAGNOSIS — Z743 Need for continuous supervision: Secondary | ICD-10-CM | POA: Diagnosis not present

## 2019-03-11 DIAGNOSIS — R0602 Shortness of breath: Secondary | ICD-10-CM | POA: Diagnosis not present

## 2019-03-11 LAB — CBC WITH DIFFERENTIAL/PLATELET
Abs Immature Granulocytes: 0.11 10*3/uL — ABNORMAL HIGH (ref 0.00–0.07)
Basophils Absolute: 0 10*3/uL (ref 0.0–0.1)
Basophils Relative: 0 %
Eosinophils Absolute: 0 10*3/uL (ref 0.0–0.5)
Eosinophils Relative: 0 %
HCT: 44.4 % (ref 39.0–52.0)
Hemoglobin: 13.6 g/dL (ref 13.0–17.0)
Immature Granulocytes: 1 %
Lymphocytes Relative: 2 %
Lymphs Abs: 0.3 10*3/uL — ABNORMAL LOW (ref 0.7–4.0)
MCH: 29.2 pg (ref 26.0–34.0)
MCHC: 30.6 g/dL (ref 30.0–36.0)
MCV: 95.3 fL (ref 80.0–100.0)
Monocytes Absolute: 0.3 10*3/uL (ref 0.1–1.0)
Monocytes Relative: 2 %
Neutro Abs: 12.9 10*3/uL — ABNORMAL HIGH (ref 1.7–7.7)
Neutrophils Relative %: 95 %
Platelets: 217 10*3/uL (ref 150–400)
RBC: 4.66 MIL/uL (ref 4.22–5.81)
RDW: 13.9 % (ref 11.5–15.5)
WBC: 13.7 10*3/uL — ABNORMAL HIGH (ref 4.0–10.5)
nRBC: 0 % (ref 0.0–0.2)

## 2019-03-11 LAB — BASIC METABOLIC PANEL
Anion gap: 10 (ref 5–15)
BUN: 17 mg/dL (ref 8–23)
CO2: 33 mmol/L — ABNORMAL HIGH (ref 22–32)
Calcium: 9.5 mg/dL (ref 8.9–10.3)
Chloride: 95 mmol/L — ABNORMAL LOW (ref 98–111)
Creatinine, Ser: 0.98 mg/dL (ref 0.61–1.24)
GFR calc Af Amer: >60 mL/min (ref >60)
GFR calc non Af Amer: >60 mL/min (ref >60)
Glucose, Bld: 146 mg/dL — ABNORMAL HIGH (ref 70–99)
Potassium: 3.8 mmol/L (ref 3.5–5.1)
Sodium: 138 mmol/L (ref 135–145)

## 2019-03-11 LAB — D-DIMER, QUANTITATIVE: D-Dimer, Quant: 0.5 ug/mL-FEU (ref 0.00–0.50)

## 2019-03-11 LAB — SARS CORONAVIRUS 2 (TAT 6-24 HRS): SARS Coronavirus 2: NEGATIVE

## 2019-03-11 LAB — TROPONIN I (HIGH SENSITIVITY)
Troponin I (High Sensitivity): 5 ng/L (ref <18)
Troponin I (High Sensitivity): 6 ng/L (ref <18)

## 2019-03-11 MED ORDER — MAGNESIUM SULFATE 2 GM/50ML IV SOLN
2.0000 g | Freq: Once | INTRAVENOUS | Status: AC
  Administered 2019-03-11: 2 g via INTRAVENOUS
  Filled 2019-03-11: qty 50

## 2019-03-11 MED ORDER — PREDNISONE 20 MG PO TABS: 40.0000 mg | ORAL_TABLET | Freq: Every day | ORAL | 0 refills | Status: DC

## 2019-03-11 MED ORDER — ALBUTEROL SULFATE HFA 108 (90 BASE) MCG/ACT IN AERS
2.0000 | INHALATION_SPRAY | Freq: Once | RESPIRATORY_TRACT | Status: AC
  Administered 2019-03-11: 2 via RESPIRATORY_TRACT
  Filled 2019-03-11: qty 6.7

## 2019-03-11 NOTE — Discharge Instructions (Signed)
Continue using your albuterol nebulizers as directed.  Start the prednisone prescription tomorrow.  Follow-up with Dr. Griffin Dakin office in 2 to 3 days for recheck.

## 2019-03-11 NOTE — ED Triage Notes (Signed)
Pt arrived via [facility] for Sob and elevated HR. Pt received solu medrol on EMS. 20 G to left forearm IV. Pt stated he's had slight fevers, cough, SOB, Pt went to Dr for SOB and Dr, sent pt here. Patient moped is locked up at Dr. Gabriel Carina and security can unlock it when he is discharged.

## 2019-03-11 NOTE — ED Triage Notes (Signed)
First Covid shot received Feb 24,2021, his card says 04/01/19 but I'm sure it means Feb

## 2019-03-11 NOTE — ED Notes (Signed)
Pt. Oxygen did not drop below 96 while ambulating.

## 2019-03-11 NOTE — ED Provider Notes (Signed)
[facility] EMERGENCY DEPARTMENT Provider Note   CSN: [id number] Arrival date & time: 03/11/19  1047     History Chief Complaint  Patient presents with  . Shortness of Breath    [name] is a 69 y.o. male.  HPI      [name] is a 69 y.o. male with past medical history of asthma, COPD, CVA, and hypertension.  He presents to the Emergency Department complaining of increasing shortness of breath.  He states his symptoms have been present for 1 month and began after taking his first Covid vaccine.  He was seen here on 03/05/19 for similar symptoms.  He reports having brief improvement of his symptoms, but shortness of breath returned several days later.  He was seen at his PCPs office this morning and advised to come to the emergency department for further evaluation of his shortness of breath and noticed to be tachycardic as well.  He used his albuterol inhaler last evening without relief.  He was transported here by EMS and given Solu-Medrol prior to arrival.  He denies chest pain, peripheral edema, fever, chills, and productive cough. No known Covid exposures.     Past Medical History:  Diagnosis Date  . Asthma   . COPD (chronic obstructive pulmonary disease) ([place])   . CVA (cerebral vascular accident) ([place]) 2008   with temporary vision loss   . Depression   . ECHOCARDIOGRAM, ABNORMAL 03/06/2010   Qualifier: Diagnosis of  By: Purcell Nails, NP, Curt Bears    . Elevated PSA 2014   no diagnosis pf prostate cancer in 07/2014  . History of substance abuse ([place]) 01/14/2011   Marijuana  . History of substance abuse ([place]) 01/14/2011  . History of tobacco abuse 01/14/2011  . Hyperlipidemia   . Hypertension   . Marijuana abuse   . Nicotine addiction   . Prediabetes 2014    Patient Active Problem List   Diagnosis Date Noted  . Lobar pneumonia ([place]) 12/13/2018  . Acute respiratory failure with hypoxia ([place]) 12/13/2018  . CAP (community acquired pneumonia) 12/10/2018  . Food  insecurity 11/22/2018  . Shortness of breath 04/18/2018  . COPD with acute exacerbation ([place]) 04/18/2018  . Glucose intolerance 04/18/2018  . Social isolation 03/17/2013  . Asthma with COPD (chronic obstructive pulmonary disease) ([place]) 11/12/2012  . Tachycardia 11/05/2010  . Diabetes mellitus type 2, controlled ([place]) 10/26/2009  . Vitamin D deficiency 06/28/2009  . IGT (impaired glucose tolerance) 01/25/2009  . PSA, INCREASED 01/25/2009  . Hyperlipidemia 07/30/2007  . Essential hypertension 07/30/2007    History reviewed. No pertinent surgical history.     No family history on file.  Social History   Tobacco Use  . Smoking status: Former Smoker    Packs/day: 0.50    Years: 40.00    Pack years: 20.00    Types: Cigarettes    Quit date: 04/14/2015    Years since quitting: 3.9  . Smokeless tobacco: Never Used  Substance Use Topics  . Alcohol use: Not Currently    Alcohol/week: 3.0 standard drinks    Types: 3 Cans of beer per week    Comment: occasionally  . Drug use: No    Comment: pt quit 20 years ago    Home Medications Prior to Admission medications   Medication Sig Start Date End Date Taking? Authorizing Provider  albuterol (PROVENTIL) (2.5 MG/3ML) 0.083% nebulizer solution INHALE 1 VIAL VIA NEBULIZER EVERY 4 HOURS AS NEEDED. 01/11/19   Fayrene Helper, MD  aspirin  81 MG tablet Take 81 mg by mouth daily.    [provider]  Fluticasone-Salmeterol (ADVAIR DISKUS) 500-50 MCG/DOSE AEPB Inhale 1 puff into the lungs 2 (two) times daily. INHALE 1 PUFF BY MOUTH TWICE A DAY. RINSE MOUTH AFTER USE. Patient taking differently: Inhale 1 puff into the lungs 2 (two) times daily.  10/20/18   Perlie Mayo, NP  ipratropium (ATROVENT) 0.02 % nebulizer solution Take 2.5 mLs (0.5 mg total) by nebulization every 6 (six) hours as needed for wheezing or shortness of breath. 01/27/19   Fayrene Helper, MD  Ipratropium-Albuterol (COMBIVENT) 20-100 MCG/ACT AERS respimat One  puff into lungs every 6 hours Patient not taking: Reported on 03/05/2019 10/20/18   Perlie Mayo, NP  pravastatin (PRAVACHOL) 40 MG tablet TAKE (1) TABLET BY MOUTH AT BEDTIME. 12/21/18   Fayrene Helper, MD  predniSONE (DELTASONE) 50 MG tablet Take 1 tablet (50 mg total) by mouth daily for 5 days. 03/06/19 03/11/19  [place], Sophia, PA-C  PROAIR HFA 108 (90 Base) MCG/ACT inhaler Inhale 2 puffs into the lungs every 6 (six) hours as needed for wheezing or shortness of breath. 10/20/18   Perlie Mayo, NP  triamterene-hydrochlorothiazide (MAXZIDE-25) 37.5-25 MG tablet TAKE 1 & 1/2 TABLETS BY MOUTH ONCE DAILY. 12/21/18   Fayrene Helper, MD  simvastatin (ZOCOR) 40 MG tablet Take 40 mg by mouth daily. 10/02/10 03/18/11  Fayrene Helper, MD    Allergies    Patient has no known allergies.  Review of Systems   Review of Systems  Constitutional: Negative for appetite change, chills and fever.  HENT: Negative for congestion, sore throat and trouble swallowing.   Respiratory: Positive for cough, chest tightness and shortness of breath. Negative for wheezing.   Cardiovascular: Negative for chest pain and leg swelling.  Gastrointestinal: Negative for abdominal pain, nausea and vomiting.  Genitourinary: Negative for dysuria.  Musculoskeletal: Negative for arthralgias.  Skin: Negative for rash.  Neurological: Negative for dizziness, weakness and numbness.  Hematological: Negative for adenopathy.    Physical Exam Updated Vital Signs BP 133/81   Pulse 86   Temp (!) 97.5 F (36.4 C) (Oral)   Resp 18   Ht 5\' 7"  (1.702 m)   Wt 61.2 kg   SpO2 91%   BMI 21.14 kg/m   Physical Exam Vitals and nursing note reviewed.  Constitutional:      Appearance: Normal appearance. He is well-developed. He is not ill-appearing.  HENT:     Mouth/Throat:     Mouth: Mucous membranes are moist.  Cardiovascular:     Rate and Rhythm: Normal rate and regular rhythm.     Pulses: Normal pulses.    Pulmonary:     Effort: Respiratory distress present.     Breath sounds: No wheezing, rhonchi or rales.     Comments: Lung sounds are diminished throughout, breathing labored. No rales or wheezing noted   Abdominal:     Palpations: Abdomen is soft.     Tenderness: There is no abdominal tenderness. There is no guarding.  Musculoskeletal:        General: Normal range of motion.     Right lower leg: No edema.     Left lower leg: No edema.  Lymphadenopathy:     Cervical: No cervical adenopathy.  Skin:    General: Skin is warm.     Capillary Refill: Capillary refill takes less than 2 seconds.     Findings: No erythema or rash.  Neurological:  General: No focal deficit present.     Mental Status: He is alert.     Sensory: No sensory deficit.     Comments: CN  II-XII grossly intact.  Speech clear.       ED Results / Procedures / Treatments   Labs (all labs ordered are listed, but only abnormal results are displayed) Labs Reviewed  BASIC METABOLIC PANEL - Abnormal; Notable for the following components:      Result Value   Chloride 95 (*)    CO2 33 (*)    Glucose, Bld 146 (*)    All other components within normal limits  CBC WITH DIFFERENTIAL/PLATELET - Abnormal; Notable for the following components:   WBC 13.7 (*)    Neutro Abs 12.9 (*)    Lymphs Abs 0.3 (*)    Abs Immature Granulocytes 0.11 (*)    All other components within normal limits  SARS CORONAVIRUS 2 (TAT 6-24 HRS)  D-DIMER, QUANTITATIVE (NOT AT [facility])  TROPONIN I (HIGH SENSITIVITY)  TROPONIN I (HIGH SENSITIVITY)    EKG EKG Interpretation  Date/Time:  Thursday March 11 2019 11:34:38 EST Ventricular Rate:  122 PR Interval:    QRS Duration: 92 QT Interval:  317 QTC Calculation: 452 R Axis:   83 Text Interpretation: Sinus tachycardia Consider right atrial enlargement Borderline right axis deviation ST elevation, consider anterior injury Confirmed by Nat Christen [phone]) on 03/11/2019 11:39:20  AM   Radiology DG Chest Portable 1 View  Result Date: 03/11/2019 CLINICAL DATA:  Shortness of breath EXAM: PORTABLE CHEST 1 VIEW COMPARISON:  03/05/2019 FINDINGS: The heart size and mediastinal contours are within normal limits. Hyperexpanded lungs with flattening of the diaphragms compatible with COPD. No focal airspace consolidation, pleural effusion, or pneumothorax. The visualized skeletal structures are unremarkable. IMPRESSION: No active disease. Electronically Signed   By: Davina Poke D.O.   On: 03/11/2019 12:50    Procedures Procedures (including critical care time)  Medications Ordered in ED Medications  albuterol (VENTOLIN HFA) 108 (90 Base) MCG/ACT inhaler 2 puff (2 puffs Inhalation Given 03/11/19 1234)  magnesium sulfate IVPB 2 g 50 mL (0 g Intravenous Stopped 03/11/19 1429)    ED Course  I have reviewed the triage vital signs and the nursing notes.  Pertinent labs & imaging results that were available during my care of the patient were reviewed by me and considered in my medical decision making (see chart for details).    MDM Rules/Calculators/A&P                      Pt with hx of asthma and COPD.  Here with shortness of breath and tachycardia.  Sent here from PCP's office.  Reports having dyspnea since receiving his Covid vaccine last month.    Breathing is labored and lung sounds are diminished bilaterally. O2 sat in low 90's on RA. Improves after placed on 2L O2 by [facility]. Has received IV steroid, given albuterol and magnesium  On recheck, lung sounds have much improved.  No wheezing and no longer diminished.  Tachycardia improved, no hypoxia.  D-dimer within normal limits.  Patient also seen by Dr. Lacinda Axon and care plan discussed.  Pt rechecked, resting comfortably.  Vitals much improved.  He has ambulated in the dept and maintained O2 above 95% on RA.  Feeling much better and states he is ready for d/c.  He has albuterol neb at home along with routine inhalers.  I will  prescribe a short course of steroid  as well.    Final Clinical Impression(s) / ED Diagnoses Final diagnoses:  COPD exacerbation [facility])    Rx / DC Orders ED Discharge Orders    None       Bufford Lope 03/12/19 2122    Nat Christen, MD 03/17/19 [phone]

## 2019-03-17 ENCOUNTER — Telehealth: Payer: Self-pay

## 2019-03-17 NOTE — Telephone Encounter (Signed)
Called in states he is sob again with chest feeling tight, Advised ER. Michela Pitcher he was going to try another breathing treatment and if no better he will go

## 2019-03-18 ENCOUNTER — Other Ambulatory Visit: Payer: Self-pay | Admitting: Family Medicine

## 2019-03-18 DIAGNOSIS — R69 Illness, unspecified: Secondary | ICD-10-CM | POA: Diagnosis not present

## 2019-03-19 ENCOUNTER — Encounter: Payer: Self-pay | Admitting: Family Medicine

## 2019-03-19 ENCOUNTER — Ambulatory Visit (INDEPENDENT_AMBULATORY_CARE_PROVIDER_SITE_OTHER): Payer: Medicare HMO | Admitting: Family Medicine

## 2019-03-19 ENCOUNTER — Other Ambulatory Visit: Payer: Self-pay

## 2019-03-19 VITALS — BP 118/78 | HR 120 | Temp 97.7°F | Resp 16 | Ht 67.0 in | Wt 126.0 lb

## 2019-03-19 DIAGNOSIS — R000 Tachycardia, unspecified: Secondary | ICD-10-CM

## 2019-03-19 DIAGNOSIS — R7981 Abnormal blood-gas level: Secondary | ICD-10-CM

## 2019-03-19 DIAGNOSIS — R69 Illness, unspecified: Secondary | ICD-10-CM | POA: Diagnosis not present

## 2019-03-19 DIAGNOSIS — Z09 Encounter for follow-up examination after completed treatment for conditions other than malignant neoplasm: Secondary | ICD-10-CM | POA: Diagnosis not present

## 2019-03-19 DIAGNOSIS — Z604 Social exclusion and rejection: Secondary | ICD-10-CM

## 2019-03-19 DIAGNOSIS — J449 Chronic obstructive pulmonary disease, unspecified: Secondary | ICD-10-CM

## 2019-03-19 NOTE — Progress Notes (Signed)
Subjective:  Patient ID: [name], male    DOB: August 22, 1950  Age: 69 y.o. [name]: [id number]  CC:  Chief Complaint  Patient presents with  . ER follow up    COPD flare      HPI  HPI   [name] is a 69 y.o. male with past medical history of asthma, COPD, CVA, and hypertension.  He presented to the Emergency Department complaining of increasing shortness of breath.  He states his symptoms have been present for 1 month and began after taking his first Covid vaccine.  He was seen in Ed on 03/05/19 for similar symptoms.  He reports having brief improvement of his symptoms, but shortness of breath returned several days later.  He was seen at his PCPs office that morning and advised to come to the emergency department for further evaluation of his shortness of breath and noticed to be tachycardic as well, due to no appts.  He used his albuterol inhaler last evening without relief.  He was transported here by EMS and given Solu-Medrol prior to arrival.  He denies chest pain, peripheral edema, fever, chills, and productive cough. No known Covid exposures.    Oxygen Qualification:  RA 88% Walking on RA 86%, Walking on 2 L [place] applied 95%  Resting on 2 L [place] 96%  Today patient denies signs and symptoms of COVID 19 infection including fever, chills, cough, shortness of breath, and headache. Past Medical, Surgical, Social History, Allergies, and Medications have been Reviewed.   Past Medical History:  Diagnosis Date  . Asthma   . CAP (community acquired pneumonia) 12/10/2018  . COPD (chronic obstructive pulmonary disease) ([place])   . COPD with acute exacerbation ([place]) 04/18/2018  . CVA (cerebral vascular accident) ([place]) 2008   with temporary vision loss   . Depression   . ECHOCARDIOGRAM, ABNORMAL 03/06/2010   Qualifier: Diagnosis of  By: Purcell Nails, NP, Curt Bears    . Elevated PSA 2014   no diagnosis pf prostate cancer in 07/2014  . History of substance abuse ([place]) 01/14/2011   Marijuana   . History of substance abuse ([place]) 01/14/2011  . History of tobacco abuse 01/14/2011  . Hyperlipidemia   . Hypertension   . Lobar pneumonia ([place]) 12/13/2018  . Marijuana abuse   . Nicotine addiction   . Prediabetes 2014    Current Meds  Medication Sig  . albuterol (PROVENTIL) (2.5 MG/3ML) 0.083% nebulizer solution INHALE 1 VIAL VIA NEBULIZER EVERY 4 HOURS AS NEEDED.  [name] Kitchen aspirin 81 MG tablet Take 81 mg by mouth daily.  . Fluticasone-Salmeterol (ADVAIR DISKUS) 500-50 MCG/DOSE AEPB Inhale 1 puff into the lungs 2 (two) times daily. INHALE 1 PUFF BY MOUTH TWICE A DAY. RINSE MOUTH AFTER USE. (Patient taking differently: Inhale 1 puff into the lungs 2 (two) times daily. )  . ipratropium (ATROVENT) 0.02 % nebulizer solution Take 2.5 mLs (0.5 mg total) by nebulization every 6 (six) hours as needed for wheezing or shortness of breath.  . pravastatin (PRAVACHOL) 40 MG tablet TAKE (1) TABLET BY MOUTH AT BEDTIME. (Patient taking differently: Take 40 mg by mouth daily. )  . PROAIR HFA 108 (90 Base) MCG/ACT inhaler Inhale 2 puffs into the lungs every 6 (six) hours as needed for wheezing or shortness of breath.  . [DISCONTINUED] triamterene-hydrochlorothiazide (MAXZIDE-25) 37.5-25 MG tablet TAKE 1 & 1/2 TABLETS BY MOUTH ONCE DAILY. (Patient taking differently: Take 1.5 tablets by mouth daily. )    ROS:  ROS Negative outside  of HPI   Objective:   Today's Vitals: BP 118/78   Pulse (!) 120   Temp 97.7 F (36.5 C) (Temporal)   Resp 16   Ht 5\' 7"  (1.702 m)   Wt 126 lb (57.2 kg)   SpO2 90%   BMI 19.73 kg/m  Vitals with BMI 03/19/2019 03/11/2019 03/11/2019  Height 5\' 7"  - -  Weight 126 lbs - -  BMI Q000111Q - -  Systolic 123456 Q000111Q 0000000  Diastolic 78 81 77  Pulse 123456 86 95     Physical Exam Vitals and nursing note reviewed.  Constitutional:      Appearance: Normal appearance. He is well-developed and well-groomed.  HENT:     Head: Normocephalic and atraumatic.     Right Ear: External ear normal.      Left Ear: External ear normal.     Mouth/Throat:     Comments: Mask in place  Eyes:     General:        Right eye: No discharge.        Left eye: No discharge.     Conjunctiva/sclera: Conjunctivae normal.  Cardiovascular:     Rate and Rhythm: Regular rhythm. Tachycardia present.     Pulses: Normal pulses.     Heart sounds: Normal heart sounds.  Pulmonary:     Effort: Pulmonary effort is normal.     Breath sounds: Decreased air movement present. Examination of the right-middle field reveals wheezing. Examination of the left-middle field reveals wheezing. Decreased breath sounds and wheezing present.  Musculoskeletal:        General: Normal range of motion.     Cervical back: Normal range of motion and neck supple.  Skin:    General: Skin is warm.  Neurological:     General: No focal deficit present.     Mental Status: He is alert and oriented to person, place, and time.  Psychiatric:        Attention and Perception: Attention normal.        Mood and Affect: Mood normal.        Speech: Speech normal.        Behavior: Behavior normal. Behavior is cooperative.        Thought Content: Thought content normal.        Cognition and Memory: Cognition normal.        Judgment: Judgment normal.    Assessment   1. Asthma with COPD (chronic obstructive pulmonary disease) ([place])   2. Encounter for examination following treatment at hospital   3. Tachycardia   4. Social isolation   5. Low oxygen saturation     Tests ordered Orders Placed This Encounter  Procedures  . For home use only DME oxygen     Plan: Please see assessment and plan per problem list above.   No orders of the defined types were placed in this encounter.   Patient to follow-up in 1 month.  Perlie Mayo, NP

## 2019-03-19 NOTE — Patient Instructions (Addendum)
I appreciate the opportunity to provide you with care for your health and wellness. Today we discussed: needs for home oxygen and recent ED visit   Follow up: 1 months   No labs or referrals today  Oxygen qualification today-hopefully in next week you will get this.   Start taking Mucinex daily   Please continue to practice social distancing to keep you, your family, and our community safe.  If you must go out, please wear a mask and practice good handwashing.  It was a pleasure to see you and I look forward to continuing to work together on your health and well-being. Please do not hesitate to call the office if you need care or have questions about your care.  Have a wonderful day and week. With Gratitude, [name], DNP, AGNP-BC

## 2019-03-20 ENCOUNTER — Other Ambulatory Visit: Payer: Self-pay | Admitting: Family Medicine

## 2019-03-23 ENCOUNTER — Encounter: Payer: Self-pay | Admitting: Family Medicine

## 2019-03-23 ENCOUNTER — Ambulatory Visit: Payer: Medicare Other | Admitting: Family Medicine

## 2019-03-23 ENCOUNTER — Telehealth: Payer: Self-pay

## 2019-03-23 DIAGNOSIS — R7981 Abnormal blood-gas level: Secondary | ICD-10-CM | POA: Insufficient documentation

## 2019-03-23 NOTE — Telephone Encounter (Signed)
[facility] is calling and they are not in network for his insurance.  Try [employer]

## 2019-03-23 NOTE — Telephone Encounter (Signed)
They are working on it.

## 2019-03-23 NOTE — Telephone Encounter (Signed)
Sent message to [employer] at [employer]

## 2019-03-23 NOTE — Assessment & Plan Note (Signed)
Oxygen qualifying. See note.

## 2019-03-23 NOTE — Assessment & Plan Note (Signed)
See note-review of notes, test, and labs from ED  New orders based off findings  High risk for return. Will have close follow up trying to keep him out of ED.

## 2019-03-23 NOTE — Assessment & Plan Note (Signed)
Depressed and down about this. [facility] help? Will reach out about this.

## 2019-03-23 NOTE — Assessment & Plan Note (Signed)
On going-tolerated

## 2019-03-23 NOTE — Assessment & Plan Note (Signed)
Continue medications Reports improvement in how he is breathing. Qualified for O2, ordered today Also starting Mucinex daily as well-samples provided

## 2019-03-24 ENCOUNTER — Telehealth: Payer: Self-pay

## 2019-03-24 DIAGNOSIS — J449 Chronic obstructive pulmonary disease, unspecified: Secondary | ICD-10-CM

## 2019-03-24 NOTE — Telephone Encounter (Signed)
Referred to thn

## 2019-03-25 ENCOUNTER — Other Ambulatory Visit: Payer: Self-pay | Admitting: *Deleted

## 2019-03-25 NOTE — Patient Outreach (Signed)
[facility]) Care Management  03/25/2019  [name] Dec 20, 1950 [id number]   [facility] Telephone Assessment/Screen for MD referral  Referral Date: 03/24/19 Referral Source: MD Dr Moshe Cipro office staff, Kate Sable Referral Reason: social worker consult- pt with severe COPD has no family and lives alone needs to look for assisted living facility but has no transport or anyone to help with process   Diagnoses of COPD/ Pneumonia   Expected date of contact within 10 days   Insurance:[employer] medicare 03/05/19 & 03/11/19 [place] ED visit for shortness of breath (sob),COPD exacerbation    Ian [name] has received [facility] services from [facility] RN CMs and SWs yearly since 2016. Last services received in December 2020  Outreach attempt # 1 Successful to the home number  Ian [name] reports his telephone is not in his bed room where he likes to stay He reports it takes him awhile to walk to the telephone and usually his answering machine comes on before he can get to the telephone Ian [name] encourages [facility] staff to leave a voice message or to call a second time   Patient is able to verify HIPAA, DOB and address Reviewed and addressed referral to [facility] with patient  Referral Assessment/screening Ian [name] confirms the referral reason from his MD is correct Ian [name] states he is ready for a lower level of care as "It is just me now" He voices concern with disposition of his home and possessions upon placement. He states "I guess I will have to sell it"  Ian [name] reports he is not familiar with local lower level of care facilities and has no preference at this time for placement facility.  [facility] RN CM discussed assisted living facility care and answered questions  COPD Ian [name] reports worsening Chronic obstructive pulmonary disease (COPD)  symptoms since his first does of the covid vaccine around 03/02/19. He reports he had to go to the ED on 03/05/19 for the worsening symptoms, shortness of  breath (sob) after not resolved when he followed his COPD action plan of nebulizer treatments and inhalers at home. He reports a brief relief for a few days and the worsening symptoms increased. During another ED visit on 03/11/19 tachycardia was also noted.  He denies symptoms of covid today with no covid exposures Ian [name] reports Ian Beach, NP is assisting with obtaining oxygen as his COPD is worsening as evidence by 03/19/19 oxygen saturations values of 88% on room air, walking on room air at 86%. Epic notes indicates MD office has sent orders to various DME providers and oxygen is pending  He was seeing Dr Susann Givens as his pulmonologist until [name] retired in 2021 and Dr Griffin Dakin office is now managing his pulmonology concerns. 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 His son recently passed and was buried per pt on 03/24/19. He has no siblings and reports his wife's family "has nothing to do with me since I don't drink or smoke any more" He uses a moped for transportation to go out to medical appointments, to complete errands and go grocery shopping. He denies food insecurity. He reports a male "from the hospital" has assisted but only on occasions. He is not certain of her name ? [name]  He reports being independent to assist with all care needs related  to severe shortness of breath (sob) with any activity. He reports not taking showers, sweeping or cleaning his home related to "becoming short winded".   He reports an eleventh grade education He confirms difficulty in reading and generally has others to assist with reading. He reports he can sign his name.   He informs [facility] RN CM he has been depressed since the death of his mother when he was age 39 and his father was not active in his care. He denies concerns with his appetite, loss of interest in doing  things or wanting to harm himself or others. He reports he has to stay strong for himself.He is not on medicine for depression. Social isolation per assessment PHQ 2 =1 today  He reports some sleeping concerns   Conditions: asthma, Chronic obstructive pulmonary disease (COPD), cerebrovascular accident (CVA), Hypertension (HTN), history (hx) of  pneumonia (PNA), depression, hx of substance abuse (marijuana), hx of tobacco abuse, Hyperlipidemia (HLD), prediabetes (last Hg A1c 6.4 on 12/13/18- He denies diagnosis of diabetes)  Fall Denies, None  DME: dentures he does not wear (had about 5-6 years- needs new ones), nebulizer  Medications: Advair and albuterol cost are concern per Ian Eicher as he is on a fixed social security income  He reports managing without need at this time of pharmacy assistance    Appointments: 04/16/19 [place] NP office f/u 12/01/19 [place] NP Annual wellness visit   Advance Directives: No POA, No living will    Consent: [facility] RN CM reviewed [facility] services with patient. Patient gave verbal consent for services [facility] telephonic RN CM.   Plan: [facility] RN CM will refer Ian Frischman to [facility] SW for - MD referral Reason for consult social worker consult- pt with severe COPD has no family (wife deceased, no siblings, no friends, son buried on 03/24/19) and lives alone, needs to look for assisted living facility but has no transport (uses a moped only) or anyone to help with process  Voiced concerns about disposition of his home, moped, etc prior to low level of care placement  [facility] RN CM will follow up with Ian Arendt within the next 7-10 business days for follow up on COPD home care management, further assessment prn and care coordination  Pt encouraged to return a call to [facility] RN CM prn  [facility] RN CM sent a Gaffer with [facility] brochure, [facility] consent form with return envelope and know before you go sheet enclosed for review  Routed note to MDs/NP/PA MD  involvement barriers letter sent   [place] Problem One     Most Recent Value  Care Plan Problem One  lower level of care assistance (assisted living per MD referral)  Role Documenting the Problem One  Care Management Telephonic Coordinator  Care Plan for Problem One  Active  [facility] Long Term Goal   over the next 31 days patient will work with [facility] Social worker to receive assistance with lower level of care placement as evidence by EMR notes  THN Long Term Goal Start Date  03/25/19  Interventions for Problem One Long Term Goal  Assessed ADL and iADLs care needs , assessd support system, referral to [place] CM Short Term Goal #1   over the next 14 days [facility] SW will discuss available options as evidence by EMR notes  [facility] CM Short Term Goal #1 Start Date  03/25/19  Interventions for Short Term Goal #1  Referral to [facility]  SW, assessed for facility preference/choice/knowledge, assessed functional needs/ability, answered questions    [facility] CM Care Plan Problem Two     Most Recent Value  Care Plan Problem Two  knowledge deficits of COPD home care and DME needs for COPD  Role Documenting the Problem Two  Care Management Telephonic Coordinator  Care Plan for Problem Two  Active  Interventions for Problem Two Long Term Goal   Assessed COPD home care, knowledge of COPD action plan and DME use, Reviewed MD notes on DME orders  THN Long Term Goal  over the next 31 days patient will be able to remain without admission with improved home care of COPD as evidence by EMR notes  THN Long Term Goal Start Date  03/25/19  [facility] CM Short Term Goal #1   over the next 14 days patient will receive home oxygen per EMR and verbalization of the patient during future outreach  [facility] CM Short Term Goal #1 Start Date  03/25/19  Interventions for Short Term Goal #2   assessed knowledge and arrival of home oxygen, reviewed MD notes related to oxygen, to follow up on disposition of oxygen      Kaetlyn Noa L. Lavina Hamman, RN, BSN,  [place] Coordinator Office number [phone] Mobile number [phone]  Main [facility] number [phone] Fax number [phone]

## 2019-03-26 ENCOUNTER — Encounter: Payer: Self-pay | Admitting: *Deleted

## 2019-03-26 DIAGNOSIS — J449 Chronic obstructive pulmonary disease, unspecified: Secondary | ICD-10-CM | POA: Diagnosis not present

## 2019-03-29 ENCOUNTER — Other Ambulatory Visit: Payer: Self-pay | Admitting: Family Medicine

## 2019-03-30 ENCOUNTER — Telehealth: Payer: Self-pay

## 2019-03-30 ENCOUNTER — Other Ambulatory Visit: Payer: Self-pay | Admitting: Family Medicine

## 2019-03-30 MED ORDER — PREDNISONE 5 MG (21) PO TBPK: 5.0000 mg | ORAL_TABLET | ORAL | 0 refills | Status: DC

## 2019-03-30 NOTE — Telephone Encounter (Signed)
Pt aware.

## 2019-03-30 NOTE — Telephone Encounter (Signed)
Patient called in requesting a refill of prednisone because he he feels a flare of the COPD coming on with the weather change and took a neb treatment and feels like he needs something else

## 2019-03-30 NOTE — Telephone Encounter (Signed)
Prednisone 5 mg dose pack is prescribed

## 2019-04-02 ENCOUNTER — Other Ambulatory Visit: Payer: Self-pay | Admitting: *Deleted

## 2019-04-02 NOTE — Patient Outreach (Signed)
[facility]) Care Management  04/02/2019  [name] March 07, 1950 [id number]   [facility] Follow up Outreach to MD referred patient  Referral Date: 03/24/19 Referral Source: MD Dr Moshe Cipro office staff, Kate Sable Referral Reason: social worker consult- pt with severe COPD has no family and lives alone needs to look for assisted living facility but has no transport or anyone to help with process   Diagnoses of COPD/ Pneumonia   Expected date of contact within 10 days   Insurance:[employer] medicare 03/05/19 & 03/11/19 [place] ED visit for shortness of breath (sob),COPD exacerbation    [name] has received [facility] services from [facility] RN CMs and SWs yearly since 2016. Last services received in December 2020  Outreach attempt  unSuccessful to the home number No answer. [facility] RN CM left HIPAA [facility] Portability and Accountability Act) compliant voicemail message along with CM's contact info.   [facility] RN CM consulted [facility] SW    plans [facility] RN CM will make another call attempt to [name] within the next 7-10 business days   [place] L. Lavina Hamman, RN, BSN, [place] Coordinator Office number [phone] Mobile number [phone]  Main [facility] number [phone] Fax number [phone]

## 2019-04-09 ENCOUNTER — Emergency Department (HOSPITAL_COMMUNITY)
Admission: EM | Admit: 2019-04-09 | Discharge: 2019-04-09 | Disposition: A | Discharge status: Home / Self Care | Payer: Medicare Other | Attending: Emergency Medicine | Admitting: Emergency Medicine

## 2019-04-09 ENCOUNTER — Other Ambulatory Visit: Payer: Self-pay

## 2019-04-09 ENCOUNTER — Emergency Department (HOSPITAL_COMMUNITY): Payer: Medicare Other

## 2019-04-09 ENCOUNTER — Encounter (HOSPITAL_COMMUNITY): Payer: Self-pay

## 2019-04-09 DIAGNOSIS — E119 Type 2 diabetes mellitus without complications: Secondary | ICD-10-CM | POA: Diagnosis not present

## 2019-04-09 DIAGNOSIS — I10 Essential (primary) hypertension: Secondary | ICD-10-CM | POA: Insufficient documentation

## 2019-04-09 DIAGNOSIS — R0602 Shortness of breath: Secondary | ICD-10-CM | POA: Diagnosis not present

## 2019-04-09 DIAGNOSIS — Z79899 Other long term (current) drug therapy: Secondary | ICD-10-CM | POA: Insufficient documentation

## 2019-04-09 DIAGNOSIS — Z20822 Contact with and (suspected) exposure to covid-19: Secondary | ICD-10-CM | POA: Diagnosis not present

## 2019-04-09 DIAGNOSIS — R05 Cough: Secondary | ICD-10-CM | POA: Diagnosis not present

## 2019-04-09 DIAGNOSIS — R0600 Dyspnea, unspecified: Secondary | ICD-10-CM | POA: Diagnosis present

## 2019-04-09 DIAGNOSIS — Z8673 Personal history of transient ischemic attack (TIA), and cerebral infarction without residual deficits: Secondary | ICD-10-CM | POA: Diagnosis not present

## 2019-04-09 DIAGNOSIS — J45909 Unspecified asthma, uncomplicated: Secondary | ICD-10-CM | POA: Diagnosis not present

## 2019-04-09 DIAGNOSIS — Z87891 Personal history of nicotine dependence: Secondary | ICD-10-CM | POA: Diagnosis not present

## 2019-04-09 DIAGNOSIS — Z7982 Long term (current) use of aspirin: Secondary | ICD-10-CM | POA: Diagnosis not present

## 2019-04-09 DIAGNOSIS — J441 Chronic obstructive pulmonary disease with (acute) exacerbation: Secondary | ICD-10-CM | POA: Diagnosis not present

## 2019-04-09 LAB — BASIC METABOLIC PANEL
Anion gap: 8 (ref 5–15)
BUN: 9 mg/dL (ref 8–23)
CO2: 31 mmol/L (ref 22–32)
Calcium: 9.2 mg/dL (ref 8.9–10.3)
Chloride: 101 mmol/L (ref 98–111)
Creatinine, Ser: 0.7 mg/dL (ref 0.61–1.24)
GFR calc Af Amer: >60 mL/min (ref >60)
GFR calc non Af Amer: >60 mL/min (ref >60)
Glucose, Bld: 124 mg/dL — ABNORMAL HIGH (ref 70–99)
Potassium: 3.9 mmol/L (ref 3.5–5.1)
Sodium: 140 mmol/L (ref 135–145)

## 2019-04-09 LAB — CBC WITH DIFFERENTIAL/PLATELET
Abs Immature Granulocytes: 0.06 10*3/uL (ref 0.00–0.07)
Basophils Absolute: 0 10*3/uL (ref 0.0–0.1)
Basophils Relative: 0 %
Eosinophils Absolute: 0.2 10*3/uL (ref 0.0–0.5)
Eosinophils Relative: 2 %
HCT: 41.1 % (ref 39.0–52.0)
Hemoglobin: 12.6 g/dL — ABNORMAL LOW (ref 13.0–17.0)
Immature Granulocytes: 1 %
Lymphocytes Relative: 11 %
Lymphs Abs: 0.7 10*3/uL (ref 0.7–4.0)
MCH: 28.9 pg (ref 26.0–34.0)
MCHC: 30.7 g/dL (ref 30.0–36.0)
MCV: 94.3 fL (ref 80.0–100.0)
Monocytes Absolute: 0.6 10*3/uL (ref 0.1–1.0)
Monocytes Relative: 9 %
Neutro Abs: 5 10*3/uL (ref 1.7–7.7)
Neutrophils Relative %: 77 %
Platelets: 253 10*3/uL (ref 150–400)
RBC: 4.36 MIL/uL (ref 4.22–5.81)
RDW: 13.1 % (ref 11.5–15.5)
WBC: 6.5 10*3/uL (ref 4.0–10.5)
nRBC: 0 % (ref 0.0–0.2)

## 2019-04-09 LAB — RESPIRATORY PANEL BY RT PCR (FLU A&B, COVID)
Influenza A by PCR: NEGATIVE
Influenza B by PCR: NEGATIVE
SARS Coronavirus 2 by RT PCR: NEGATIVE

## 2019-04-09 LAB — TROPONIN I (HIGH SENSITIVITY): Troponin I (High Sensitivity): 3 ng/L (ref <18)

## 2019-04-09 MED ORDER — IPRATROPIUM-ALBUTEROL 0.5-2.5 (3) MG/3ML IN SOLN
3.0000 mL | Freq: Once | RESPIRATORY_TRACT | Status: AC
  Administered 2019-04-09: 12:00:00 3 mL via RESPIRATORY_TRACT
  Filled 2019-04-09: qty 3

## 2019-04-09 MED ORDER — METHYLPREDNISOLONE SODIUM SUCC 125 MG IJ SOLR
80.0000 mg | Freq: Once | INTRAMUSCULAR | Status: AC
  Administered 2019-04-09: 11:00:00 80 mg via INTRAVENOUS
  Filled 2019-04-09: qty 2

## 2019-04-09 MED ORDER — PREDNISONE 10 MG (21) PO TBPK: ORAL_TABLET | Freq: Every day | ORAL | 0 refills | Status: DC

## 2019-04-09 NOTE — Discharge Instructions (Addendum)
Chest x-ray showed no pneumonia.  Covid test was negative.  New steroid prescription sent to your pharmacy.  Only start it if he needed.  You should be good today because you got intravenous steroids.

## 2019-04-09 NOTE — ED Triage Notes (Signed)
Pt has been SOB since yesterday. He wears 3L [place] chronically, but did not have any oxygen on upon arrival. He has been coughing up phlegm that is clear. History of COPD. Auditory wheezes without auscultation

## 2019-04-09 NOTE — ED Notes (Signed)
Respiratory at bedside.

## 2019-04-09 NOTE — ED Notes (Signed)
Respiratory called and informed Duoneb treatment ordered, Respiratory panel negative.

## 2019-04-09 NOTE — ED Provider Notes (Addendum)
[facility] EMERGENCY DEPARTMENT Provider Note   CSN: [id number] Arrival date & time: 04/09/19  0930     History Chief Complaint  Patient presents with  . Shortness of Breath    [name] is a 69 y.o. male.  Level 5 caveat for acuity of condition.  Chief complaint worsening dyspnea.  Patient has a known history of COPD and is on 3 L of nasal cannula on a regular basis.  He has been coughing and wheezing more than usual.  Just finished oral prednisone.  He has been using his nebulizer machine at home with mixed results.  No fever, chills, rusty sputum.  He has had both Covid shots.  Severity is moderate.        Past Medical History:  Diagnosis Date  . Asthma   . CAP (community acquired pneumonia) 12/10/2018  . COPD (chronic obstructive pulmonary disease) ([place])   . COPD with acute exacerbation ([place]) 04/18/2018  . CVA (cerebral vascular accident) ([place]) 2008   with temporary vision loss   . Depression   . ECHOCARDIOGRAM, ABNORMAL 03/06/2010   Qualifier: Diagnosis of  By: Purcell Nails, NP, Curt Bears    . Elevated PSA 2014   no diagnosis pf prostate cancer in 07/2014  . History of substance abuse ([place]) 01/14/2011   Marijuana  . History of substance abuse ([place]) 01/14/2011  . History of tobacco abuse 01/14/2011  . Hyperlipidemia   . Hypertension   . Lobar pneumonia ([place]) 12/13/2018  . Marijuana abuse   . Nicotine addiction   . Prediabetes 2014    Patient Active Problem List   Diagnosis Date Noted  . Low oxygen saturation 03/23/2019  . Food insecurity 11/22/2018  . Shortness of breath 04/18/2018  . Glucose intolerance 04/18/2018  . Encounter for examination following treatment at hospital 03/25/2015  . Social isolation 03/17/2013  . Asthma with COPD (chronic obstructive pulmonary disease) ([place]) 11/12/2012  . Tachycardia 11/05/2010  . Diabetes mellitus type 2, controlled ([place]) 10/26/2009  . Vitamin D deficiency 06/28/2009  . IGT (impaired glucose tolerance) 01/25/2009  . PSA,  INCREASED 01/25/2009  . Hyperlipidemia 07/30/2007  . Essential hypertension 07/30/2007    History reviewed. No pertinent surgical history.     No family history on file.  Social History   Tobacco Use  . Smoking status: Former Smoker    Packs/day: 0.50    Years: 40.00    Pack years: 20.00    Types: Cigarettes    Quit date: 04/14/2015    Years since quitting: 3.9  . Smokeless tobacco: Never Used  Substance Use Topics  . Alcohol use: Not Currently    Alcohol/week: 3.0 standard drinks    Types: 3 Cans of beer per week    Comment: occasionally  . Drug use: No    Comment: pt quit 20 years ago    Home Medications Prior to Admission medications   Medication Sig Start Date End Date Taking? Authorizing Provider  albuterol (PROVENTIL) (2.5 MG/3ML) 0.083% nebulizer solution INHALE 1 VIAL VIA NEBULIZER EVERY 4 HOURS AS NEEDED. 03/18/19   Fayrene Helper, MD  aspirin 81 MG tablet Take 81 mg by mouth daily.    [provider]  COMBIVENT RESPIMAT 20-100 MCG/ACT AERS respimat Inhale 1 puff into the lungs 4 (four) times daily. 03/29/19   [provider]  Fluticasone-Salmeterol (ADVAIR DISKUS) 500-50 MCG/DOSE AEPB Inhale 1 puff into the lungs 2 (two) times daily. INHALE 1 PUFF BY MOUTH TWICE A DAY. RINSE MOUTH AFTER USE. Patient  taking differently: Inhale 1 puff into the lungs 2 (two) times daily.  10/20/18   Perlie Mayo, NP  ipratropium (ATROVENT) 0.02 % nebulizer solution Take 2.5 mLs (0.5 mg total) by nebulization every 6 (six) hours as needed for wheezing or shortness of breath. 01/27/19   Fayrene Helper, MD  pravastatin (PRAVACHOL) 40 MG tablet TAKE (1) TABLET BY MOUTH AT BEDTIME. 03/29/19   Fayrene Helper, MD  predniSONE (STERAPRED UNI-PAK 21 TAB) 10 MG (21) TBPK tablet Take by mouth daily. 6 tabs- 5- 4- 3- 2- 1 taper 04/09/19   Nat Christen, MD  [facility] HFA 108 [phone] Base) MCG/ACT inhaler Inhale 2 puffs into the lungs every 6 (six) hours as needed for wheezing or  shortness of breath. 10/20/18   Perlie Mayo, NP  triamterene-hydrochlorothiazide (MAXZIDE-25) 37.5-25 MG tablet TAKE 1 & 1/2 TABLETS BY MOUTH ONCE DAILY. 03/22/19   Fayrene Helper, MD  simvastatin (ZOCOR) 40 MG tablet Take 40 mg by mouth daily. 10/02/10 03/18/11  Fayrene Helper, MD    Allergies    Patient has no known allergies.  Review of Systems   Review of Systems  Unable to perform ROS: Acuity of condition    Physical Exam Updated Vital Signs BP 128/81   Pulse (!) 102   Temp 98.5 F (36.9 C) (Oral)   Resp (!) 25   Ht 5\' 7"  (1.702 m)   Wt 60.3 kg   SpO2 99%   BMI 20.83 kg/m   Physical Exam Vitals and nursing note reviewed.  Constitutional:      Comments: Minimal to moderate dyspnea and tachypnea  HENT:     Head: Normocephalic and atraumatic.  Eyes:     Conjunctiva/sclera: Conjunctivae normal.  Cardiovascular:     Rate and Rhythm: Normal rate and regular rhythm.  Pulmonary:     Effort: Pulmonary effort is normal.     Breath sounds: Normal breath sounds.     Comments: Bilateral wheezing. Abdominal:     General: Bowel sounds are normal.     Palpations: Abdomen is soft.  Musculoskeletal:        General: Normal range of motion.     Cervical back: Neck supple.  Skin:    General: Skin is warm and dry.  Neurological:     General: No focal deficit present.     Mental Status: He is alert and oriented to person, place, and time.  Psychiatric:        Behavior: Behavior normal.     ED Results / Procedures / Treatments   Labs (all labs ordered are listed, but only abnormal results are displayed) Labs Reviewed  CBC WITH DIFFERENTIAL/PLATELET - Abnormal; Notable for the following components:      Result Value   Hemoglobin 12.6 (*)    All other components within normal limits  BASIC METABOLIC PANEL - Abnormal; Notable for the following components:   Glucose, Bld 124 (*)    All other components within normal limits  RESPIRATORY PANEL BY RT PCR (FLU A&B,  COVID)  TROPONIN I (HIGH SENSITIVITY)    EKG EKG Interpretation  Date/Time:  Friday April 09 2019 10:26:14 EDT Ventricular Rate:  115 PR Interval:    QRS Duration: 90 QT Interval:  329 QTC Calculation: 455 R Axis:   83 Text Interpretation: Sinus tachycardia Borderline right axis deviation Anteroseptal infarct, age indeterminate Confirmed by Nat Christen [phone]) on 04/09/2019 11:19:23 AM   Radiology DG Chest Port 1 View  Result Date: 04/09/2019  CLINICAL DATA:  Shortness of breath since yesterday.  Cough 1. EXAM: PORTABLE CHEST 1 VIEW COMPARISON:  03/31/2019 FINDINGS: The lungs are hyperinflated likely secondary to COPD. There is no focal consolidation. There is no pleural effusion or pneumothorax. The heart and mediastinal contours are unremarkable. There is no acute osseous abnormality. IMPRESSION: No active disease. Electronically Signed   By: Kathreen Devoid   On: 04/09/2019 10:44    Procedures Procedures (including critical care time)  Medications Ordered in ED Medications  ipratropium-albuterol (DUONEB) 0.5-2.5 (3) MG/3ML nebulizer solution 3 mL (has no administration in time range)  methylPREDNISolone sodium succinate (SOLU-MEDROL) 125 mg/2 mL injection 80 mg (80 mg Intravenous Given 04/09/19 1030)    ED Course  I have reviewed the triage vital signs and the nursing notes.  Pertinent labs & imaging results that were available during my care of the patient were reviewed by me and considered in my medical decision making (see chart for details).    MDM Rules/Calculators/A&P                      History and physical suggestive of COPD exacerbation.  IV steroids, nebulizer treatment, chest x-ray.  1145: Recheck prior to discharge.  Pulse ox normal.  No respiratory distress.  Stable for discharge.  Discharge medication prednisone. Final Clinical Impression(s) / ED Diagnoses Final diagnoses:  COPD exacerbation ([place])    Rx / DC Orders ED Discharge Orders         Ordered     predniSONE (STERAPRED UNI-PAK 21 TAB) 10 MG (21) TBPK tablet  Daily     04/09/19 1145           Nat Christen, MD 04/09/19 1038    Nat Christen, MD 04/09/19 1148

## 2019-04-09 NOTE — ED Notes (Signed)
Radiology at bedside

## 2019-04-16 ENCOUNTER — Other Ambulatory Visit: Payer: Self-pay

## 2019-04-16 ENCOUNTER — Other Ambulatory Visit: Payer: Self-pay | Admitting: *Deleted

## 2019-04-16 ENCOUNTER — Ambulatory Visit (INDEPENDENT_AMBULATORY_CARE_PROVIDER_SITE_OTHER): Payer: Medicare Other | Admitting: Family Medicine

## 2019-04-16 ENCOUNTER — Encounter: Payer: Self-pay | Admitting: Family Medicine

## 2019-04-16 VITALS — BP 130/76 | HR 102 | Resp 15 | Ht 67.0 in | Wt 122.8 lb

## 2019-04-16 DIAGNOSIS — R059 Cough, unspecified: Secondary | ICD-10-CM

## 2019-04-16 DIAGNOSIS — R7981 Abnormal blood-gas level: Secondary | ICD-10-CM | POA: Diagnosis not present

## 2019-04-16 DIAGNOSIS — R05 Cough: Secondary | ICD-10-CM

## 2019-04-16 DIAGNOSIS — J449 Chronic obstructive pulmonary disease, unspecified: Secondary | ICD-10-CM | POA: Diagnosis not present

## 2019-04-16 NOTE — Patient Instructions (Addendum)
I appreciate the opportunity to provide you with care for your health and wellness. Today we discussed: breathing   Follow up: 12 weeks  No labs   Referral today-Pulmonary  Come by the office for Birthday on Tuesday around 1150-1200 pm  Please continue to practice social distancing to keep you, your family, and our community safe.  If you must go out, please wear a mask and practice good handwashing.  It was a pleasure to see you and I look forward to continuing to work together on your health and well-being. Please do not hesitate to call the office if you need care or have questions about your care.  Have a wonderful day and week. With Gratitude, [name], DNP, AGNP-BC

## 2019-04-16 NOTE — Patient Outreach (Signed)
[facility] [facility]) Care Management  04/16/2019  NOUMAN MALAGON 1950-06-03 [id number]   Second unsuccessful [facility]N Follow up Outreach to MD referred patient  Referral Date:03/24/19 Referral Source:MD Dr Moshe Cipro office staff,BrandiHudy Referral Reason:social worker consult- pt with severe COPD has no family and lives alone needs to look for assisted living facility but has no transport or anyone to help with process   Diagnoses of COPD/ Pneumonia   Expected date of contact within 10 days   Insurance:[employer]are 03/05/19 & 03/11/19 [place] ED visit forshortness of breath (sob),COPD exacerbation   Mr Pace has received [facility] services from [facility] RN CMs and SWs yearly since 2016. Last services received in December 2020   [facility] RN CM consulted [facility] SW, Gildardo Griffes about placement resources for Mr Aengus Lowder attempt Unsuccessful to the home number No answer. THN RN CM left HIPAA [facility] Portability and Accountability Act) compliant voicemail message along with CM's contact info.     plans [facility] RN CM will make another call attempt to Mr Alvelo within the next 7-10 business days [facility] unsuccessful outreach letter sent a Routed note to MD  In basket message to Dr Velia Meyer L. Lavina Hamman, RN, BSN, [place] Coordinator Office number [phone] Mobile number [phone]  Main THN number [phone] Fax number [phone]

## 2019-04-16 NOTE — Assessment & Plan Note (Signed)
Continue on medication at this time.  Has great improvement today in the office.  Reordering for portable oxygen as his mode of transportation does not allow him to carry his oxygen tank.  Continue daily Mucinex samples provided again.  Referral to pulmonology to help with better management as he has been in and out of the hospital several times with COPD exacerbations.

## 2019-04-16 NOTE — Patient Outreach (Signed)
[facility]) Care Management  04/16/2019  [name] Mar 08, 1950 [id number]   CSW made an initial attempt to try and contact patient today to perform phone assessment, as well as assess and assist with social needs and services, without success. CSW had received referral from [place] for possible placement. A HIPPA compliant message was left for patient on voicemail (ph#: [phone]). CSW is currently awaiting a return call. CSW will have CMA mail unsuccessful outreach letter along with Medicaid application for ALF and list of facilities, CSW will make a second outreach attempt within the next 3-4 days, if CSW does not receive a return call from patient in the meantime.    Raynaldo Opitz, LCSW [facility]  Clinical Social Worker cell #: [phone]

## 2019-04-16 NOTE — Progress Notes (Signed)
Subjective:  Patient ID: [name], male    DOB: [birth year]  Age: 69 y.o. [name]: [id number]  CC:  Chief Complaint  Patient presents with  . COPD    follow up- wants oxygen conserving device to carry around        HPI  HPI  [name] is a 69 year old male patient of Dr. Moshe Cipro. Here for follow-up after going to the emergency room on April 2 secondary to having increased worsening dyspnea.  He has a significantly known history of COPD recently I was able to get him on 3 L of nasal cannula at home on a regular basis now.  But he reported that he was having coughing and wheezing more than usual and the oxygen was not helping him.  He had just finished oral prednisone from previous weeks.  Has been using his nebulizer machine at home with mixed results prior to going to the emergency room.  Had no fevers chills or rusty sputum.  He has had both Covid shots.  ED x-ray was negative for acute processes.  He was treated with IV steroids, nebulizer treatment.  Pulse ox returned to normal prior to discharge.  No respiratory distress.  He was discharged home on prednisone which he has completed.  Presents today feeling much better breathing well and not having any issues.  His main concern is that he cannot take his oxygen tank with him when he leaves the house secondary to his mode of transportation being a moped.  He is unable to carry the oxygen tank with him.  He would like to see if he can get a portable pack that he can wear so that he can leave the house and have his oxygen with him.   Today patient denies signs and symptoms of COVID 19 infection including fever, chills, cough, shortness of breath, and headache. Past Medical, Surgical, Social History, Allergies, and Medications have been Reviewed.   Past Medical History:  Diagnosis Date  . Asthma   . CAP (community acquired pneumonia) 12/10/2018  . COPD (chronic obstructive pulmonary disease) ([place])   . COPD with acute exacerbation  ([place]) 04/18/2018  . CVA (cerebral vascular accident) ([place]) 2008   with temporary vision loss   . Depression   . ECHOCARDIOGRAM, ABNORMAL 03/06/2010   Qualifier: Diagnosis of  By: Purcell Nails, NP, Curt Bears    . Elevated PSA 2014   no diagnosis pf prostate cancer in 07/2014  . History of substance abuse ([place]) 01/14/2011   Marijuana  . History of substance abuse ([place]) 01/14/2011  . History of tobacco abuse 01/14/2011  . Hyperlipidemia   . Hypertension   . Lobar pneumonia ([place]) 12/13/2018  . Marijuana abuse   . Nicotine addiction   . Prediabetes 2014    Current Meds  Medication Sig  . albuterol (PROVENTIL) (2.5 MG/3ML) 0.083% nebulizer solution INHALE 1 VIAL VIA NEBULIZER EVERY 4 HOURS AS NEEDED.  [name] Kitchen aspirin 81 MG tablet Take 81 mg by mouth daily.  . Fluticasone-Salmeterol (ADVAIR DISKUS) 500-50 MCG/DOSE AEPB Inhale 1 puff into the lungs 2 (two) times daily. INHALE 1 PUFF BY MOUTH TWICE A DAY. RINSE MOUTH AFTER USE. (Patient taking differently: Inhale 1 puff into the lungs 2 (two) times daily. )  . ipratropium (ATROVENT) 0.02 % nebulizer solution Take 2.5 mLs (0.5 mg total) by nebulization every 6 (six) hours as needed for wheezing or shortness of breath.  . pravastatin (PRAVACHOL) 40 MG tablet TAKE (1) TABLET BY MOUTH AT BEDTIME.  [name] Kitchen  PROAIR HFA 108 (90 Base) MCG/ACT inhaler Inhale 2 puffs into the lungs every 6 (six) hours as needed for wheezing or shortness of breath.  . triamterene-hydrochlorothiazide (MAXZIDE-25) 37.5-25 MG tablet TAKE 1 & 1/2 TABLETS BY MOUTH ONCE DAILY.    ROS:  Review of Systems  Constitutional: Negative.   HENT: Negative.   Eyes: Negative.   Respiratory: Negative.   Cardiovascular: Negative.   Gastrointestinal: Negative.   Genitourinary: Negative.   Musculoskeletal: Negative.   Skin: Negative.   Neurological: Negative.   Endo/Heme/Allergies: Negative.   Psychiatric/Behavioral: Negative.   All other systems reviewed and are negative.    Objective:   Today's  Vitals: BP 130/76   Pulse (!) 102   Resp 15   Ht 5\' 7"  (1.702 m)   Wt 122 lb 12.8 oz (55.7 kg)   SpO2 93%   BMI 19.23 kg/m  Vitals with BMI 04/16/2019 04/09/2019 04/09/2019  Height 5\' 7"  - -  Weight 122 lbs 13 oz - -  BMI [id number] - -  Systolic [id number]  Diastolic 76 90 81  Pulse [id number] 98 102     Physical Exam Vitals and nursing note reviewed.  Constitutional:      Appearance: Normal appearance. He is well-developed, well-groomed and normal weight.  HENT:     Head: Normocephalic and atraumatic.     Right Ear: External ear normal.     Left Ear: External ear normal.     Mouth/Throat:     Comments: Mask in place Eyes:     General:        Right eye: No discharge.        Left eye: No discharge.     Conjunctiva/sclera: Conjunctivae normal.  Cardiovascular:     Rate and Rhythm: Normal rate and regular rhythm.     Pulses: Normal pulses.     Heart sounds: Normal heart sounds.  Pulmonary:     Effort: Pulmonary effort is normal.     Breath sounds: Normal breath sounds.  Musculoskeletal:        General: Normal range of motion.     Cervical back: Normal range of motion and neck supple.  Skin:    General: Skin is warm.  Neurological:     General: No focal deficit present.     Mental Status: He is alert and oriented to person, place, and time.  Psychiatric:        Attention and Perception: Attention normal.        Mood and Affect: Mood normal.        Speech: Speech normal.        Behavior: Behavior normal. Behavior is cooperative.        Thought Content: Thought content normal.        Cognition and Memory: Cognition normal.        Judgment: Judgment normal.     Assessment   1. Asthma with COPD (chronic obstructive pulmonary disease) ([place])   2. Low oxygen saturation   3. Cough     Tests ordered Orders Placed This Encounter  Procedures  . For home use only DME oxygen  . Ambulatory referral to Pulmonology     Plan: Please see assessment and plan per problem list above.    No orders of the defined types were placed in this encounter.   Patient to follow-up in 12 weeks  Perlie Mayo, NP

## 2019-04-20 ENCOUNTER — Other Ambulatory Visit: Payer: Self-pay

## 2019-04-20 DIAGNOSIS — J449 Chronic obstructive pulmonary disease, unspecified: Secondary | ICD-10-CM

## 2019-04-20 MED ORDER — PROAIR HFA 108 (90 BASE) MCG/ACT IN AERS: 2.0000 | INHALATION_SPRAY | Freq: Four times a day (QID) | RESPIRATORY_TRACT | 5 refills | Status: DC | PRN

## 2019-04-20 MED ORDER — FLUTICASONE-SALMETEROL 500-50 MCG/DOSE IN AEPB: 1.0000 | INHALATION_SPRAY | Freq: Two times a day (BID) | RESPIRATORY_TRACT | 5 refills | Status: DC

## 2019-04-22 ENCOUNTER — Telehealth: Payer: Self-pay

## 2019-04-22 ENCOUNTER — Other Ambulatory Visit: Payer: Self-pay | Admitting: *Deleted

## 2019-04-22 NOTE — Patient Outreach (Addendum)
[facility]) Care Management  04/22/2019  JOVONTE FITZER [birth year] [id number]   Third unsuccessful THNFollow up Outreach toMD referred patient  Referral Date:03/24/19 Referral Source:MD Dr Moshe Cipro office staff,BrandiHudy Referral Reason:social worker consult- pt with severe COPD has no family and lives alone needs to look for assisted living facility but has no transport or anyone to help with process   Diagnoses of COPD/ Pneumonia   Expected date of contact within 10 days   Insurance:[employer]are 03/05/19 & 03/11/19 [place] ED visit forshortness of breath (sob),COPD exacerbation   Mr Freire has received [facility] services from [facility] RN CMs and SWs yearly since 2016. Last services received in December 2020   [facility] RN CM consulted [facility] SW, Gildardo Griffes about placement resources for Mr Klay [facility] SW was noted to also have an unsuccessful outreach on 04/16/19  Outreach attemptUnsuccessful to the only number listed for him in [place], the home number [phone] No answer. [facility] RN CM left HIPAA [facility] Portability and Accountability Act) compliant voicemail message along with CM & THN SW's contact info.    plansTHN RN CM will make another call attempt to get the pcp office to have pt return a call to [place] or THN SW by sending a message via EPIC to LPN, Velna Hatchet (Dr [employer] nurse) Mr Lotz has been pended for case closure in the next 6 business days if no return call from him  [facility] unsuccessful outreach letter sent on 04/16/19 Routed note to MD    [place]. Lavina Hamman, RN, BSN, [place] Coordinator Office number [phone] Mobile number [phone]  Main THN number [phone] Fax number [phone]

## 2019-04-22 NOTE — Telephone Encounter (Signed)
Patient called and stating he's starting to get tight and having some SOB and wants prednisone called in. Please advise. Jarrett Soho not in office today

## 2019-04-23 ENCOUNTER — Other Ambulatory Visit: Payer: Self-pay | Admitting: Family Medicine

## 2019-04-23 MED ORDER — PREDNISONE 5 MG (21) PO TBPK: 5.0000 mg | ORAL_TABLET | ORAL | 0 refills | Status: DC

## 2019-04-23 NOTE — Progress Notes (Signed)
pred 

## 2019-04-23 NOTE — Telephone Encounter (Signed)
Prednisone dose pack is prescribed, pt is aware

## 2019-04-27 ENCOUNTER — Other Ambulatory Visit: Payer: Self-pay | Admitting: *Deleted

## 2019-04-27 NOTE — Patient Outreach (Signed)
[facility] [facility]) Care Management  04/27/2019  [name] May 21, 1950 [id number]   [place] coordination-collaboration with pt friend and THN SW  [facility] RN CM received a voice message from [name] stating [name] was visiting her and received [facility] RN CM number. She reports being a friend who previously worked with [name] and assists him with reading his mail as he had difficulty with reading and writing.    [facility] RN CM returned a call to [name] and encouraged her to have [name] to return a call to [facility] RN CM and/or [facility] SW, [employer]. Provided CM and SW  [facility] RN CM sent an epic in basket message to [place] updating her on this  Plan [name]'s pended case closure has been extended for another 5 business days. The case will be closed if no return call from him  Routed to MD  [place]. Lavina Hamman, RN, BSN, [place] Coordinator Office number [phone] Mobile number [phone]  Main THN number [phone] Fax number [phone]

## 2019-05-06 NOTE — Patient Outreach (Signed)
[name]) Care Management  05/06/2019  [name] 03-13-1950 [id number]   CSW received call from patient's friend, Madelaine Etienne (ph#: h#: [phone] c#: [phone]) who states that she is trying to help patient out and noticed a letter that we had tried to get in touch with him. CSW spoke with Inez Catalina about patient requesting placement and reviewed options. Patient would likely qualify for [facility] and CSW mentioned [facility] which patient was familiar with and expressed interest in. CSW made call to [name] (ph#: [phone]) to see if they have availability. [name] states that right now she does not think that they do, but she will talk with her son who also has [place] to see where they would have availability and requested that CSW call her back next week. CSW will follow-up with [name] and patient/[name].    Raynaldo Opitz, LCSW [facility]  Clinical Social Worker cell #: [phone]

## 2019-05-11 ENCOUNTER — Other Ambulatory Visit: Payer: Self-pay | Admitting: *Deleted

## 2019-05-11 ENCOUNTER — Ambulatory Visit (INDEPENDENT_AMBULATORY_CARE_PROVIDER_SITE_OTHER): Payer: Medicare HMO | Admitting: Internal Medicine

## 2019-05-11 ENCOUNTER — Encounter: Payer: Self-pay | Admitting: Internal Medicine

## 2019-05-11 ENCOUNTER — Other Ambulatory Visit: Payer: Self-pay

## 2019-05-11 DIAGNOSIS — J449 Chronic obstructive pulmonary disease, unspecified: Secondary | ICD-10-CM | POA: Diagnosis not present

## 2019-05-11 MED ORDER — TRELEGY ELLIPTA 100-62.5-25 MCG/INH IN AEPB: INHALATION_SPRAY | RESPIRATORY_TRACT | 11 refills | Status: DC

## 2019-05-11 MED ORDER — TRELEGY ELLIPTA 200-62.5-25 MCG/INH IN AEPB: 1.0000 | INHALATION_SPRAY | Freq: Every day | RESPIRATORY_TRACT | 0 refills | Status: DC

## 2019-05-11 MED ORDER — PREDNISONE 10 MG PO TABS: ORAL_TABLET | ORAL | 0 refills | Status: DC

## 2019-05-11 NOTE — Assessment & Plan Note (Addendum)
Quit smoking 2017  - 05/11/2019  After extensive coaching inhaler device,  effectiveness =    90% elipta so rec change to trelegy  - 05/11/2019   Walked on RA   3000 ft -@ avg pace stopped due to sob with sats at end  93%    DDX of  difficult airways management almost all start with A and  include Adherence, Ace Inhibitors, Acid Reflux, Active Sinus Disease, Alpha 1 Antitripsin deficiency, Anxiety masquerading as Airways dz,  ABPA,  Allergy(esp in young), Aspiration (esp in elderly), Adverse effects of meds,  Active smoking or vaping, A bunch of PE's (a small clot burden can't cause this syndrome unless there is already severe underlying pulm or vascular dz with poor reserve) plus two Bs  = Bronchiectasis and Beta blocker use..and one C= CHF   Adherence is always the initial "prime suspect" and is a multilayered concern that requires a "trust but verify" approach in every patient - starting with knowing how to use medications, especially inhalers, correctly, keeping up with refills and understanding the fundamental difference between maintenance and prns vs those medications only taken for a very short course and then stopped and not refilled.  - see device teaching - return with all meds in hand using a trust but verify approach to confirm accurate Medication  Reconciliation The principal here is that until we are certain that the  patients are doing what we've asked, it makes no sense to ask them to do more.   ? Adverse effects of advair > change to trelegy and if not better change to breztri next ov.  ? Allergy/ asthma component > Prednisone 10 mg take  4 each am x 2 days,   2 each am x 2 days,  1 each am x 2 days and stop   ? Alpha on AT def > extremely rare in AA's   Group D in terms of symptom/risk and laba/lama/ICS  therefore appropriate rx at this point >>>  trelegy best option and reduce saba:  I spent extra time with pt today reviewing appropriate use of albuterol for prn use on exertion  with the following points: 1) saba is for relief of sob that does not improve by walking a slower pace or resting but rather if the pt does not improve after trying this first. 2) If the pt is convinced, as many are, that saba helps recover from activity faster then it's easy to tell if this is the case by re-challenging : ie stop, take the inhaler, then p 5 minutes try the exact same activity (intensity of workload) that just caused the symptoms and see if they are substantially diminished or not after saba 3) if there is an activity that reproducibly causes the symptoms, try the saba 15 min before the activity on alternate days   If in fact the saba really does help, then fine to continue to use it prn but advised may need to look closer at the maintenance regimen being used to achieve better control of airways disease with exertion.      Pt with very poor insight into how/ when to use 02 and would benefit from pulmonary rehab once we have him on a stable regimen.   >>> f/u 4 weeks to regroup           Each maintenance medication was reviewed in detail including emphasizing most importantly the difference between maintenance and prns and under what circumstances the prns are to be triggered  using an action plan format where appropriate.  Total time for H and P, chart review, counseling, teaching device and generating customized AVS unique to this office visit / charting = 60 min

## 2019-05-11 NOTE — Patient Instructions (Addendum)
Stop wixela, atrovent (ipatropium solution) and combivent   Plan A = Automatic = Always=    Trelegy one click each am - take two good drags  Plan B = Backup (to supplement plan A, not to replace it) Only use your albuterol inhaler (PROAIR)  as a rescue medication to be used if you can't catch your breath by resting or doing a relaxed purse lip breathing pattern.  - The less you use it, the better it will work when you need it. - Ok to use the inhaler up to 2 puffs  every 4 hours if you must but call for appointment if use goes up over your usual need - Don't leave home without it !!  (think of it like the spare tire for your car)   Plan C = Crisis (instead of Plan B but only if Plan B stops working) - only use your albuterol nebulizer if you first try Plan B and it fails to help > ok to use the nebulizer up to every 4 hours but if start needing it regularly call for immediate appointment   Prednisone 10 mg take  4 each am x 2 days,   2 each am x 2 days,  1 each am x 2 days and stop    Make sure you check your oxygen saturations at highest level of activity (not after you stop!) to be sure it stays over 90% and adjust upward to maintain this level if needed but remember to turn it back to previous settings when you stop (to conserve your supply).   Please schedule a follow up office visit in 4 weeks, sooner if needed

## 2019-05-11 NOTE — Patient Outreach (Signed)
[facility]) Care Management  05/11/2019  [name] 1950-10-08 [id number]   CSW called to speak with patient to inform him that CSW is still working with [facility] to try to place patient. CSW called & spoke with [name] at [facility], which patient is familiar with (and [name] is familiar with patient as well) but unfortunately they do not have any male beds available at this time. [name] asked that CSW call her in 1 month to see if anything has opened up. CSW also reached out to the following facilities:   [facility]  [address] [place], [place] [address] [phone] no male beds available at this time, try back in 1 month    [facility] [address] [place], [place] [address] [phone]   [name] cell#: [phone] no male beds available at this time, try back in 1 month  [facility]  [address] [place], [place] [address] [phone]  [phone] [name] unless he can qualify for Medicaid, they wouldn't be able to take him  [facility]  [address] [place], [place] [address] [phone]  spoke with [name], will have [facility] call back  [facility] [address] [place], [place] [address]      [phone]  [name]  male bed possible Friday  faxed [facility]  [address] [place], [place] [address] [phone] *no answer   [facility] [address] [place], [place] [address] [phone]  no male beds availale at this time   [facility]  [address] [place], [place] [address] [phone]  no male beds availale at this time  [facility]  [address] [place], [place] [address] [phone]  no male beds availale at this time    CSW completed & sent FL2 to [facility] at [facility]  regarding possible male bed  becoming available Friday. CSW will follow-up Monday if no call from [facility] & will continue the [facility] search if no bed.    Raynaldo Opitz, LCSW [facility]  Clinical Social Worker cell #: [phone]

## 2019-05-11 NOTE — Progress Notes (Signed)
[name], male    DOB: 25-Nov-1950,  [name]: [id number]   Brief patient profile:  52 yobm quit smoking 2017  grew up with dx of asthma but only started needing in HS when started smoking and eventually placed on advair 500 and followed by [name] and referred to pulmonary clinic in [place]  05/11/2019 by [name] p [name] retired     History of Present Illness  05/11/2019  Pulmonary/ 1st office eval/[name] on advair 500  Chief Complaint  Patient presents with  . Pulmonary Consult    Referred by [name]. Former patient of [name]. He states his breathing has been worse since he had first covid vaccine 03/03/19. He gets SOB when he wakes up in the am and starts moving around. He has cough with cream colored sputum. He is using his albuterol inhaler about every 6 hours and duonebs 2 x daily.   Dyspnea:  Can't walk a block = MMRC3 = can't walk 100 yards even at a slow pace at a flat grade s stopping due to sob   Cough: min white mucus  Sleep: bed is flat 2 pillows  SABA use: way too much as above 02 :  Only uses 02 p sits down   No obvious day to day or daytime variability or assoc excess/ purulent sputum or mucus plugs or hemoptysis or cp or chest tightness, subjective wheeze or overt sinus or hb symptoms.   Sleeping  without nocturnal  or early am exacerbation  of respiratory  c/o's or need for noct saba. Also denies any obvious fluctuation of symptoms with weather or environmental changes or other aggravating or alleviating factors except as outlined above   No unusual exposure hx or h/o childhood pna/ or knowledge of premature birth.  Current Allergies, Complete Past Medical History, Past Surgical History, Family History, and Social History were reviewed in [employer] record.  ROS  The following are not active complaints unless bolded Hoarseness, sore throat, dysphagia, dental problems, itching, sneezing,  nasal congestion or discharge of  excess mucus or purulent secretions, ear ache,   fever, chills, sweats, unintended wt loss or wt gain, classically pleuritic or exertional cp,  orthopnea pnd or arm/hand swelling  or leg swelling, presyncope, palpitations, abdominal pain, anorexia, nausea, vomiting, diarrhea  or change in bowel habits or change in bladder habits, change in stools or change in urine, dysuria, hematuria,  rash, arthralgias, visual complaints, headache, numbness, weakness or ataxia or problems with walking or coordination,  change in mood or  memory.           Past Medical History:  Diagnosis Date  . Asthma   . CAP (community acquired pneumonia) 12/10/2018  . COPD (chronic obstructive pulmonary disease) ([place])   . COPD with acute exacerbation ([place]) 04/18/2018  . CVA (cerebral vascular accident) ([place]) 2008   with temporary vision loss   . Depression   . ECHOCARDIOGRAM, ABNORMAL 03/06/2010   Qualifier: Diagnosis of  By: Purcell Nails, [name], Curt Bears    . Elevated PSA 2014   no diagnosis pf prostate cancer in 07/2014  . History of substance abuse ([place]) 01/14/2011   Marijuana  . History of substance abuse ([place]) 01/14/2011  . History of tobacco abuse 01/14/2011  . Hyperlipidemia   . Hypertension   . Lobar pneumonia ([place]) 12/13/2018  . Marijuana abuse   . Nicotine addiction   . Prediabetes 2014    Outpatient Medications Prior to Visit  Medication  Sig Dispense Refill  . albuterol (PROVENTIL) (2.5 MG/3ML) 0.083% nebulizer solution INHALE 1 VIAL VIA NEBULIZER EVERY 4 HOURS AS NEEDED. 375 mL 3  . aspirin 81 MG tablet Take 81 mg by mouth daily.    . COMBIVENT RESPIMAT 20-100 MCG/ACT AERS respimat Inhale 1 puff into the lungs 4 (four) times daily.    . Fluticasone-Salmeterol (ADVAIR DISKUS) 500-50 MCG/DOSE AEPB Inhale 1 puff into the lungs 2 (two) times daily. INHALE 1 PUFF BY MOUTH TWICE A DAY. RINSE MOUTH AFTER USE. 1 each 5  . guaiFENesin (MUCINEX) 600 MG 12 hr tablet Take 600 mg by mouth 2 (two) times daily as needed.    [name] Kitchen  ipratropium (ATROVENT) 0.02 % nebulizer solution Take 2.5 mLs (0.5 mg total) by nebulization every 6 (six) hours as needed for wheezing or shortness of breath. 312.5 mL 5  . pravastatin (PRAVACHOL) 40 MG tablet TAKE (1) TABLET BY MOUTH AT BEDTIME. 90 tablet 0  . PROAIR HFA 108 (90 Base) MCG/ACT inhaler Inhale 2 puffs into the lungs every 6 (six) hours as needed for wheezing or shortness of breath. 18 g 5  . triamterene-hydrochlorothiazide (MAXZIDE-25) 37.5-25 MG tablet TAKE 1 & 1/2 TABLETS BY MOUTH ONCE DAILY. 135 tablet 0  . predniSONE (STERAPRED UNI-PAK 21 TAB) 5 MG (21) TBPK tablet Take 1 tablet (5 mg total) by mouth as directed. Use as directed Finished one week prior to OV            Objective:     BP 138/80 (BP Location: Left Arm, Cuff Size: Normal)   Pulse 90   Temp (!) 97.1 F (36.2 C) (Temporal)   Ht 5\' 7"  (1.702 m)   Wt 122 lb (55.3 kg)   SpO2 94% Comment: on RA  BMI 19.11 kg/m   SpO2: 94 %(on RA)  Thin amb bm / edentulous  HEENT : pt wearing mask not removed for exam due to covid - 19 concerns.    NECK :  without JVD/Nodes/TM/ nl carotid upstrokes bilaterally   LUNGS: no acc muscle use,  Mild barrel  contour chest wall with bilateral  Distant bs s audible wheeze and  without cough on insp or exp maneuvers  and mild  Hyperresonant  to  percussion bilaterally     CV:  RRR  no s3 or murmur or increase in P2, and no edema   ABD:  soft and nontender with pos end  insp Hoover's  in the supine position. No bruits or organomegaly appreciated, bowel sounds nl  MS:   Nl gait/  ext warm without deformities, calf tenderness, cyanosis or clubbing No obvious joint restrictions   SKIN: warm and dry without lesions    NEURO:  alert, approp, nl sensorium with  no motor or cerebellar deficits apparent.       I personally reviewed images and agree with radiology impression as follows:  CXR: port 04/09/19 No active disease.       Assessment   No problem-specific Assessment  & Plan notes found for this encounter.     Ian Gully, MD 05/11/2019

## 2019-05-17 ENCOUNTER — Ambulatory Visit: Payer: Self-pay | Admitting: *Deleted

## 2019-05-26 ENCOUNTER — Other Ambulatory Visit: Payer: Self-pay | Admitting: *Deleted

## 2019-05-26 ENCOUNTER — Encounter: Payer: Self-pay | Admitting: *Deleted

## 2019-05-26 NOTE — Patient Outreach (Signed)
[facility]) Care Management  05/26/2019  [name] 05/02/1950 [id number]   CSW was able to make initial contact with patient today to perform the phone assessment, as well as assess and assist with social work needs and services.  CSW introduced self, explained role and types of services provided through [facility] ([facility]).  CSW further explained to patient that CSW works with patient's RNCM, also with [facility], Jackelyn Poling.  CSW then explained the reason for the call, indicating that Mrs. Lavina Hamman thought that patient would benefit from social work services and resources to assist with alternative housing arrangements, preferably in a group home setting or an assisted living facility.  CSW obtained two HIPAA compliant identifiers from patient, which included patient's name and date of birth.  CSW explained to patient that CSW is aware that patient was working with Raynaldo Opitz, Licensed Clinical Social Worker, also with [facility], but that patient would be working with [place] moving forward.  Patient voiced understanding and was agreeable to this plan; however, patient admitted that he is no longer interested in pursuing placement, of any kind, needing to sell his home before having the financial means to pay for additional housing.  Patient went on to say that he currently owns his house, but is in the process of working with a realtor to get the house on the market and prepared to sell.  Patient reported that it will probably take him several months just to fix the items on his house that need to be prepared, before placing a For Sale sign in the yard.  Patient denied having food insecurities, or inability to pay for prescription medications.  Patient has transportation to and from all of his physician appointments and denies the need for socialization.  CSW encouraged patient to take down CSW's  contact information, requesting that patient contact CSW directly if he needs assistance with placement in the near future, or if additional social work needs arise.  CSW will perform a case closure on patient, as all goals of treatment have been met from social work standpoint and no additional social work needs have been identified at this time.  CSW will notify Mrs. Lavina Hamman of CSW's plans to close patient's case.  CSW will fax an update to patient's Primary Care Physician, Dr. [name] to ensure that they are aware of CSW's involvement with patient's plan of care.    Nat Christen, BSW, MSW, LCSW  Licensed Education officer, [facility]  Mailing [place] [address] [place], [place] [address] Physical Address-[address], [place], [place] [address] Toll Free Main # [phone] Fax # [phone] Cell # [phone]  Office # [phone] [email or web address]

## 2019-05-28 ENCOUNTER — Encounter: Payer: Self-pay | Admitting: Internal Medicine

## 2019-05-28 ENCOUNTER — Other Ambulatory Visit: Payer: Self-pay

## 2019-05-28 ENCOUNTER — Ambulatory Visit (INDEPENDENT_AMBULATORY_CARE_PROVIDER_SITE_OTHER): Payer: Medicare HMO | Admitting: Internal Medicine

## 2019-05-28 ENCOUNTER — Ambulatory Visit (HOSPITAL_COMMUNITY)
Admission: RE | Admit: 2019-05-28 | Discharge: 2019-05-28 | Disposition: A | Discharge status: Home / Self Care | Payer: Medicare HMO | Source: Ambulatory Visit | Attending: Internal Medicine | Admitting: Internal Medicine

## 2019-05-28 ENCOUNTER — Telehealth: Payer: Self-pay | Admitting: Internal Medicine

## 2019-05-28 DIAGNOSIS — J449 Chronic obstructive pulmonary disease, unspecified: Secondary | ICD-10-CM

## 2019-05-28 DIAGNOSIS — R918 Other nonspecific abnormal finding of lung field: Secondary | ICD-10-CM

## 2019-05-28 DIAGNOSIS — R0602 Shortness of breath: Secondary | ICD-10-CM | POA: Diagnosis not present

## 2019-05-28 MED ORDER — METHYLPREDNISOLONE ACETATE 40 MG/ML IJ SUSP
120.0000 mg | Freq: Once | INTRAMUSCULAR | Status: AC
  Administered 2019-05-28: 120 mg via INTRAMUSCULAR

## 2019-05-28 MED ORDER — AMOXICILLIN-POT CLAVULANATE 875-125 MG PO TABS: 1.0000 | ORAL_TABLET | Freq: Two times a day (BID) | ORAL | 0 refills | Status: AC

## 2019-05-28 MED ORDER — BREZTRI AEROSPHERE 160-9-4.8 MCG/ACT IN AERO: 2.0000 | INHALATION_SPRAY | Freq: Two times a day (BID) | RESPIRATORY_TRACT | 0 refills | Status: DC

## 2019-05-28 NOTE — Progress Notes (Signed)
Spoke with pt and notified of results per Dr. Wert. Pt verbalized understanding and denied any questions. 

## 2019-05-28 NOTE — Patient Instructions (Addendum)
Depomedrol 120 mg IM   Augmentin 875 mg take one pill twice daily  X 10 days - take at breakfast and supper with large glass of water.  It would help reduce the usual side effects (diarrhea and yeast infections) if you ate cultured yogurt at lunch.   Change your inhalers:  BREZTRI  AUTOMATICALLY =  take 2 puffs first thing in am and then another 2 puffs about 12 hours later.    Only use your albuterol nebulizer  as a rescue medication to be used if you can't catch your breath by resting or doing a relaxed purse lip breathing pattern.  - The less you use it, the better it will work when you need it. - Ok to use up to every 4 hours if you must but call for immediate appointment if use goes up over your usual need - Don't leave home without it !!  (think of it like the spare tire for your car)   We will call to schedule follow up on Thursday afternoon the 3rd of June    Please remember to go to the  x-ray department  for your tests - we will call you with the results when they are available

## 2019-05-28 NOTE — Telephone Encounter (Signed)
Call report from [facility] in Radiology. MW please review chest xray.

## 2019-05-28 NOTE — Telephone Encounter (Signed)
Spoke with pt and scheduled appt for today for eval

## 2019-05-28 NOTE — Progress Notes (Signed)
[name], male    DOB: May 03, 1950,  [name]: [id number]   Brief patient profile:  88 yobm quit smoking 2017  grew up with dx of asthma but only started needing in HS when started smoking and eventually placed on advair 500 and followed by [name] and referred to pulmonary clinic in [place]  05/11/2019 by [name] p [name] retired     History of Present Illness  05/11/2019  Pulmonary/ 1st office eval/Sheena Donegan on advair 500  Chief Complaint  Patient presents with  . Pulmonary Consult    Referred by [name], NP. Former patient of [name]. He states his breathing has been worse since he had first covid vaccine 03/03/19. He gets SOB when he wakes up in the am and starts moving around. He has cough with cream colored sputum. He is using his albuterol inhaler about every 6 hours and duonebs 2 x daily.   Dyspnea:  Can't walk a block = MMRC3 = can't walk 100 yards even at a slow pace at a flat grade s stopping due to sob   Cough: min white mucus  Sleep: bed is flat 2 pillows  SABA use: way too much as above 02 :  Only uses 02 p sits down  rec Stop wixela, atrovent (ipatropium solution) and combivent  Plan A = Automatic = Always=    Trelegy one click each am - take two good drags Plan B = Backup (to supplement plan A, not to replace it) Only use your albuterol inhaler (PROAIR)  as a rescue medication   Plan C = Crisis (instead of Plan B but only if Plan B stops working) - only use your albuterol nebulizer if you first try Plan B and it fails to help > ok to use the nebulizer up to every 4 hours but if start needing it regularly call for immediate appointment  Prednisone 10 mg take  4 each am x 2 days,   2 each am x 2 days,  1 each am x 2 days and stop  Make sure you check your oxygen saturations at highest level of activity (not after you stop!) to be sure it stays over 90% and adjust upward to maintain this level if needed but remember to turn it back to previous settings when you  stop (to conserve your supply).     05/28/2019  Acute  ov/Rahm Minix re: copd / moderna 2nd shot 3/24  - totally confused with meds  Chief Complaint  Patient presents with  . Acute Visit    Pt states his breathing is not improving since the last visit 05/11/19- using proair 2 x daily until he just ran out. He also c/o cough with green sputum.   Dyspnea:  50 ft  - Pace Moderately  Fast from observation at ov  Cough: green x 2 months (said min white on 05/11/19 eval)   Sleeping: worse x 3 nights  SABA use: ran out of proair one day prior to ov/ very poor concept of maint vs prns and using trelegy sometimes twice daily  02: 02 prn but mostly uses p exertion/ does not monitor sats    No obvious day to day or daytime variability or assoc   mucus plugs or hemoptysis or cp or chest tightness, subjective wheeze or overt sinus or hb symptoms.    .Also denies any obvious fluctuation of symptoms with weather or environmental changes or other aggravating or alleviating factors except as outlined above  No unusual exposure hx or h/o childhood pna/ asthma or knowledge of premature birth.  Current Allergies, Complete Past Medical History, Past Surgical History, Family History, and Social History were reviewed in [employer] record.  ROS  The following are not active complaints unless bolded Hoarseness, sore throat, dysphagia, dental problems, itching, sneezing,  nasal congestion or discharge of excess mucus or purulent secretions, ear ache,   fever, chills, sweats, unintended wt loss or wt gain, classically pleuritic or exertional cp,  orthopnea pnd or arm/hand swelling  or leg swelling, presyncope, palpitations, abdominal pain, anorexia, nausea, vomiting, diarrhea  or change in bowel habits or change in bladder habits, change in stools or change in urine, dysuria, hematuria,  rash, arthralgias, visual complaints, headache, numbness, weakness or ataxia or problems with walking or coordination,   change in mood or  memory.        Current Meds - Not limited by breathing from desired activities    Medication Sig  . albuterol (PROVENTIL) (2.5 MG/3ML) 0.083% nebulizer solution INHALE 1 VIAL VIA NEBULIZER EVERY 4 HOURS AS NEEDED.  [name] Kitchen aspirin 81 MG tablet Take 81 mg by mouth daily.  . Fluticasone-Umeclidin-Vilant (TRELEGY ELLIPTA) 100-62.5-25 MCG/INH AEPB One click each am, take two deep drags  . guaiFENesin (MUCINEX) 600 MG 12 hr tablet Take 600 mg by mouth 2 (two) times daily as needed.  . pravastatin (PRAVACHOL) 40 MG tablet TAKE (1) TABLET BY MOUTH AT BEDTIME.  [name] Kitchen PROAIR HFA 108 (90 Base) MCG/ACT inhaler Inhale 2 puffs into the lungs every 6 (six) hours as needed for wheezing or shortness of breath.  . triamterene-hydrochlorothiazide (MAXZIDE-25) 37.5-25 MG tablet TAKE 1 & 1/2 TABLETS BY MOUTH ONCE DAILY.                 Past Medical History:  Diagnosis Date  . Asthma   . CAP (community acquired pneumonia) 12/10/2018  . COPD (chronic obstructive pulmonary disease) ([place])   . COPD with acute exacerbation ([place]) 04/18/2018  . CVA (cerebral vascular accident) ([place]) 2008   with temporary vision loss   . Depression   . ECHOCARDIOGRAM, ABNORMAL 03/06/2010   Qualifier: Diagnosis of  By: Purcell Nails, NP, Curt Bears    . Elevated PSA 2014   no diagnosis pf prostate cancer in 07/2014  . History of substance abuse ([place]) 01/14/2011   Marijuana  . History of substance abuse ([place]) 01/14/2011  . History of tobacco abuse 01/14/2011  . Hyperlipidemia   . Hypertension   . Lobar pneumonia ([place]) 12/13/2018  . Marijuana abuse   . Nicotine addiction   . Prediabetes 2014      Objective:     Wt Readings from Last 3 Encounters:  05/28/19 118 lb 8 oz (53.8 kg)  05/11/19 122 lb (55.3 kg)  04/16/19 122 lb 12.8 oz (55.7 kg)     Vital signs reviewed  05/28/2019  - Note at rest 02 sats  91% on RA    Thin amb bm / edentulous   HEENT : pt wearing mask not removed for exam due to covid - 19 concerns.     NECK :  without JVD/Nodes/TM/ nl carotid upstrokes bilaterally   LUNGS: no acc muscle use,  Mild barrel  contour chest wall with bilateral  Distant bs s audible wheeze and  without cough on insp or exp maneuvers  and mild  Hyperresonant  to  percussion bilaterally   - squeaks in LLL   CV:  RRR  no s3 or murmur  or increase in P2, and no edema   ABD:  soft and nontender with pos end  insp Hoover's  in the supine position. No bruits or organomegaly appreciated, bowel sounds nl  MS:   Nl gait/  ext warm without deformities, calf tenderness, cyanosis or clubbing No obvious joint restrictions   SKIN: warm and dry without lesions    NEURO:  alert, approp, nl sensorium with  no motor or cerebellar deficits apparent.          CXR PA and Lateral:   05/28/2019 :    I personally reviewed images and agree with radiology impression as follows:   Changes of COPD with new patchy opacities in the lingula, favor pneumonia.  Some of the areas however are slightly more focal/nodular in appearance.  Short-term radiographic follow-up in 2-3 weeks recommended following treatment to ensure resolution and exclude pulmonary nodules.           Assessment

## 2019-05-28 NOTE — Telephone Encounter (Signed)
See result note.  

## 2019-05-29 ENCOUNTER — Encounter: Payer: Self-pay | Admitting: Internal Medicine

## 2019-05-29 DIAGNOSIS — R918 Other nonspecific abnormal finding of lung field: Secondary | ICD-10-CM | POA: Insufficient documentation

## 2019-05-29 NOTE — Assessment & Plan Note (Addendum)
Quit smoking 2017  - 05/11/2019  After extensive coaching inhaler device,  effectiveness =    90% elipta so rec change to trelegy  - 05/11/2019   Walked on RA   300 ft -@ avg pace stopped due to sob with sats at end  93%  - 05/28/2019  After extensive coaching inhaler device,  effectiveness =    75% changed to breztri trial x 2 week samples Quit smoking 2017  - 05/11/2019  After extensive coaching inhaler device,  effectiveness =    90% elipta so rec change to trelegy  - 05/11/2019   Walked on RA   300 ft -@ avg pace stopped due to sob with sats at end  93%  - 05/28/2019  After extensive coaching inhaler device,  effectiveness =    75% changed to breztri trial x 2 week samples -  05/28/2019   Walked RA  approx   300 ft  @ moderately fast pace  stopped due to  Sob/ sats still 90%     Group D in terms of symptom/risk and laba/lama/ICS  therefore appropriate rx at this point >>>  Try depomedrol 120 IM and change trelegy  to breztri and use saba approp   ? If he is medically illiterate as he did not process/ follow any of the instructions from prior ov correctly and overusing trelegy and prns so spent extra time using teachback method to make sure he understood this time

## 2019-05-29 NOTE — Assessment & Plan Note (Addendum)
Noted 05/28/2019 in context of green sputum - 05/28/2019 rec augmentin x 10 days then repeat cxr   He's convinced this problem is related to taking moderna vaccination but I strongly doubt it - will f/u with cxr as above p rx          Each maintenance medication was reviewed in detail including emphasizing most importantly the difference between maintenance and prns and under what circumstances the prns are to be triggered using an action plan format where appropriate.  Total time for H and P, chart review, counseling,  directly observing portions of ambulatory 02 saturation study/  and generating customized AVS unique to this acute office visit / charting = 30 min

## 2019-06-08 ENCOUNTER — Ambulatory Visit (HOSPITAL_COMMUNITY)
Admission: RE | Admit: 2019-06-08 | Discharge: 2019-06-08 | Disposition: A | Discharge status: Home / Self Care | Payer: Medicare HMO | Source: Ambulatory Visit | Attending: Internal Medicine | Admitting: Internal Medicine

## 2019-06-08 ENCOUNTER — Other Ambulatory Visit: Payer: Self-pay

## 2019-06-08 DIAGNOSIS — R918 Other nonspecific abnormal finding of lung field: Secondary | ICD-10-CM | POA: Insufficient documentation

## 2019-06-08 DIAGNOSIS — R05 Cough: Secondary | ICD-10-CM | POA: Diagnosis not present

## 2019-06-10 ENCOUNTER — Ambulatory Visit (INDEPENDENT_AMBULATORY_CARE_PROVIDER_SITE_OTHER): Payer: Medicare HMO | Admitting: Internal Medicine

## 2019-06-10 ENCOUNTER — Other Ambulatory Visit: Payer: Self-pay

## 2019-06-10 ENCOUNTER — Encounter: Payer: Self-pay | Admitting: Internal Medicine

## 2019-06-10 DIAGNOSIS — R05 Cough: Secondary | ICD-10-CM | POA: Diagnosis not present

## 2019-06-10 DIAGNOSIS — J449 Chronic obstructive pulmonary disease, unspecified: Secondary | ICD-10-CM | POA: Diagnosis not present

## 2019-06-10 DIAGNOSIS — R918 Other nonspecific abnormal finding of lung field: Secondary | ICD-10-CM | POA: Diagnosis not present

## 2019-06-10 DIAGNOSIS — J310 Chronic rhinitis: Secondary | ICD-10-CM | POA: Diagnosis not present

## 2019-06-10 DIAGNOSIS — R059 Cough, unspecified: Secondary | ICD-10-CM

## 2019-06-10 MED ORDER — TRELEGY ELLIPTA 100-62.5-25 MCG/INH IN AEPB: 1.0000 | INHALATION_SPRAY | Freq: Every day | RESPIRATORY_TRACT | 0 refills | Status: DC

## 2019-06-10 MED ORDER — ALBUTEROL SULFATE HFA 108 (90 BASE) MCG/ACT IN AERS: INHALATION_SPRAY | RESPIRATORY_TRACT | 11 refills | Status: DC

## 2019-06-10 MED ORDER — TRELEGY ELLIPTA 100-62.5-25 MCG/INH IN AEPB: 1.0000 | INHALATION_SPRAY | Freq: Every day | RESPIRATORY_TRACT | Status: DC

## 2019-06-10 NOTE — Patient Instructions (Addendum)
We will call to schedule sinus and chest CT prior to next office visit   Plan A = Automatic = Always=    trelegy one click each am first thing and out thru nose   [name] is the same medication but Take 2 puffs first thing in am and then another 2 puffs about 12 hours later and out thru the nose    Plan B = Backup (to supplement plan A, not to replace it) Only use your albuterol inhaler PROAIR)  as a rescue medication to be used if you can't catch your breath by resting or doing a relaxed purse lip breathing pattern.  - The less you use it, the better it will work when you need it. - Ok to use the inhaler up to 2 puffs  every 4 hours if you must but call for appointment if use goes up over your usual need - Don't leave home without it !!  (think of it like the spare tire for your car)   Plan C = Crisis (instead of Plan B but only if Plan B stops working) - only use your albuterol nebulizer if you first try Plan B and it fails to help > ok to use the nebulizer up to every 4 hours but if start needing it regularly call for immediate appointment    Please schedule a follow up office visit in 4 weeks, sooner if needed  with all medications /inhalers/ solutions in hand so we can verify exactly what you are taking. This includes all medications from all doctors and over the counters

## 2019-06-10 NOTE — Assessment & Plan Note (Signed)
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

## 2019-06-10 NOTE — Progress Notes (Signed)
[name], male    DOB: 1950/05/12,  [name]: [id number]   Brief patient profile:  1 yobm quit smoking 2017  grew up with dx of asthma but only started needing in HS when started smoking and eventually placed on advair 500 and followed by [name] and referred to pulmonary clinic in [place]  05/11/2019 by [name] p [name] retired     History of Present Illness  05/11/2019  Pulmonary/ 1st office eval/[name] on advair 500  Chief Complaint  Patient presents with  . Pulmonary Consult    Referred by [name]. Former patient of [name]. He states his breathing has been worse since he had first covid vaccine 03/03/19. He gets SOB when he wakes up in the am and starts moving around. He has cough with cream colored sputum. He is using his albuterol inhaler about every 6 hours and duonebs 2 x daily.   Dyspnea:  Can't walk a block = MMRC3 = can't walk 100 yards even at a slow pace at a flat grade s stopping due to sob   Cough: min white mucus  Sleep: bed is flat 2 pillows  SABA use: way too much as above 02 :  Only uses 02 p sits down  rec Stop wixela, atrovent (ipatropium solution) and combivent  Plan A = Automatic = Always=    Trelegy one click each am - take two good drags Plan B = Backup (to supplement plan A, not to replace it) Only use your albuterol inhaler (PROAIR)  as a rescue medication   Plan C = Crisis (instead of Plan B but only if Plan B stops working) - only use your albuterol nebulizer if you first try Plan B and it fails to help > ok to use the nebulizer up to every 4 hours but if start needing it regularly call for immediate appointment  Prednisone 10 mg take  4 each am x 2 days,   2 each am x 2 days,  1 each am x 2 days and stop  Make sure you check your oxygen saturations at highest level of activity (not after you stop!) to be sure it stays over 90% and adjust upward to maintain this level if needed but remember to turn it back to previous settings when you  stop (to conserve your supply).     05/28/2019  Acute  ov/[name] re: copd / moderna 2nd shot 3/24  - totally confused with meds  Chief Complaint  Patient presents with  . Acute Visit    Pt states his breathing is not improving since the last visit 05/11/19- using proair 2 x daily until he just ran out. He also c/o cough with green sputum.   Dyspnea:  50 ft  - Pace Moderately  Fast from observation at ov  Cough: green x 2 months (said min white on 05/11/19 eval)   Sleeping: worse x 3 nights  SABA use: ran out of proair one day prior to ov/ very poor concept of maint vs prns and using trelegy sometimes twice daily  02: 02 prn but mostly uses p exertion/ does not monitor sats  rec Depomedrol 120 mg IM  Augmentin 875 mg take one pill twice daily  X 10 days - take at breakfast and supper with large glass of water. Change your inhalers: BREZTRI  AUTOMATICALLY =  take 2 puffs first thing in am and then another 2 puffs about 12 hours later.  Only use your  albuterol nebulizer  as a rescue medication   06/10/2019  f/u ov/[name] re: copd group D symptoms/ risk  Chief Complaint  Patient presents with  . Follow-up    Breathing has improved some but not back to normal baseline. He is coughing less but still has some minimal green sputum.   Dyspnea:  Some better - [employer] walking now  Cough: much better but still some am green mucus  Sleeping: able to lie flat  SABA use: using neb in am/ still has full breztri so not using it correctly  02: only uses prn    No obvious day to day or daytime variability or assoc excess/ purulent sputum or mucus plugs or hemoptysis or cp or chest tightness, subjective wheeze or overt  hb symptoms.   Sleeping  without nocturnal   exacerbation  of respiratory  c/o's or need for noct saba. Also denies any obvious fluctuation of symptoms with weather or environmental changes or other aggravating or alleviating factors except as outlined above   No unusual exposure hx or h/o  childhood pna/ asthma or knowledge of premature birth.  Current Allergies, Complete Past Medical History, Past Surgical History, Family History, and Social History were reviewed in [employer] record.  ROS  The following are not active complaints unless bolded Hoarseness, sore throat, dysphagia, dental problems, itching, sneezing,  nasal congestion or discharge of excess mucus or purulent secretions, ear ache,   fever, chills, sweats, unintended wt loss or wt gain, classically pleuritic or exertional cp,  orthopnea pnd or arm/hand swelling  or leg swelling, presyncope, palpitations, abdominal pain, anorexia, nausea, vomiting, diarrhea  or change in bowel habits or change in bladder habits, change in stools or change in urine, dysuria, hematuria,  rash, arthralgias, visual complaints, headache, numbness, weakness or ataxia or problems with walking or coordination,  change in mood or  memory.        Current Meds  Medication Sig  . aspirin 81 MG tablet Take 81 mg by mouth daily.  . Budeson-Glycopyrrol-Formoterol (BREZTRI AEROSPHERE) 160-9-4.8 MCG/ACT AERO Inhale 2 puffs into the lungs 2 (two) times daily.  [name] Kitchen ipratropium-albuterol (DUONEB) 0.5-2.5 (3) MG/3ML SOLN Take 3 mLs by nebulization every 4 (four) hours as needed.  . pravastatin (PRAVACHOL) 40 MG tablet TAKE (1) TABLET BY MOUTH AT BEDTIME.  [name] Kitchen triamterene-hydrochlorothiazide (MAXZIDE-25) 37.5-25 MG tablet TAKE 1 & 1/2 TABLETS BY MOUTH ONCE DAILY.                  Past Medical History:  Diagnosis Date  . Asthma   . CAP (community acquired pneumonia) 12/10/2018  . COPD (chronic obstructive pulmonary disease) ([place])   . COPD with acute exacerbation ([place]) 04/18/2018  . CVA (cerebral vascular accident) ([place]) 2008   with temporary vision loss   . Depression   . ECHOCARDIOGRAM, ABNORMAL 03/06/2010   Qualifier: Diagnosis of  By: Ian Nails, [name], [name]    . Elevated PSA 2014   no diagnosis pf prostate cancer in 07/2014   . History of substance abuse ([place]) 01/14/2011   Marijuana  . History of substance abuse ([place]) 01/14/2011  . History of tobacco abuse 01/14/2011  . Hyperlipidemia   . Hypertension   . Lobar pneumonia ([place]) 12/13/2018  . Marijuana abuse   . Nicotine addiction   . Prediabetes 2014      Objective:      06/10/2019         122   05/28/19 118 lb 8 oz (53.8 kg)  05/11/19 122 lb (55.3 kg)  04/16/19 122 lb 12.8 oz (55.7 kg)       Vital signs reviewed  06/10/2019  - Note at rest 02 sats  95% on RA       Report Edentulous HEENT : pt wearing mask not removed for exam due to covid - 19 concerns.    NECK :  without JVD/Nodes/TM/ nl carotid upstrokes bilaterally   LUNGS: no acc muscle use,  Mild barrel  contour chest wall with bilateral  Distant bs s audible wheeze and  without cough on insp or exp maneuvers  and mild  Hyperresonant  to  percussion bilaterally     CV:  RRR  no s3 or murmur or increase in P2, and no edema   ABD:  soft and nontender with pos end  insp Hoover's  in the supine position. No bruits or organomegaly appreciated, bowel sounds nl  MS:   Nl gait/  ext warm without deformities, calf tenderness, cyanosis or clubbing No obvious joint restrictions   SKIN: warm and dry without lesions    NEURO:  alert, approp, nl sensorium with  no motor or cerebellar deficits apparent.           I personally reviewed images and agree with radiology impression as follows:  CXR:  06/08/19 1. Persistent area of abnormal density anteriorly on the lateral view. CT scan of the chest with contrast recommended for further evaluation. 2. COPD.          Assessment

## 2019-06-10 NOTE — Assessment & Plan Note (Signed)
Sinus CT 06/10/2019 >>>  Already failed augementin so hold further abx for now and proceed with sinus ct/ ent f/u prn           Each maintenance medication was reviewed in detail including emphasizing most importantly the difference between maintenance and prns and under what circumstances the prns are to be triggered using an action plan format where appropriate.  Total time for H and P, chart review, counseling, teaching device and generating customized AVS unique to this office visit / charting = 30 min

## 2019-06-10 NOTE — Assessment & Plan Note (Signed)
Quit smoking 2017  - 05/11/2019  After extensive coaching inhaler device,  effectiveness =    90% elipta so rec change to trelegy  - 05/11/2019   Walked on RA   300 ft -@ avg pace stopped due to sob with sats at end  93%  - 05/28/2019  After extensive coaching inhaler device,  effectiveness =    75% changed to breztri trial x 2 week samples -  05/28/2019   Walked RA  approx   300 ft  @ moderately fast pace  stopped due to  Sob/ sats still 90%   - 06/10/2019  After extensive coaching inhaler device,  effectiveness =    90% with dpi/ 75% with hfa   He gets easily confused between maint and prns and I suspect he is medically illiterate so will use up what samples he has of treley = breztri and see which one he prefers/can afford and stick with that for now  .

## 2019-06-15 DIAGNOSIS — Z7722 Contact with and (suspected) exposure to environmental tobacco smoke (acute) (chronic): Secondary | ICD-10-CM | POA: Diagnosis not present

## 2019-06-15 DIAGNOSIS — Z008 Encounter for other general examination: Secondary | ICD-10-CM | POA: Diagnosis not present

## 2019-06-15 DIAGNOSIS — K08109 Complete loss of teeth, unspecified cause, unspecified class: Secondary | ICD-10-CM | POA: Diagnosis not present

## 2019-06-15 DIAGNOSIS — E785 Hyperlipidemia, unspecified: Secondary | ICD-10-CM | POA: Diagnosis not present

## 2019-06-15 DIAGNOSIS — I10 Essential (primary) hypertension: Secondary | ICD-10-CM | POA: Diagnosis not present

## 2019-06-15 DIAGNOSIS — Z9981 Dependence on supplemental oxygen: Secondary | ICD-10-CM | POA: Diagnosis not present

## 2019-06-15 DIAGNOSIS — R69 Illness, unspecified: Secondary | ICD-10-CM | POA: Diagnosis not present

## 2019-06-15 DIAGNOSIS — J449 Chronic obstructive pulmonary disease, unspecified: Secondary | ICD-10-CM | POA: Diagnosis not present

## 2019-06-15 DIAGNOSIS — Z7982 Long term (current) use of aspirin: Secondary | ICD-10-CM | POA: Diagnosis not present

## 2019-06-15 DIAGNOSIS — Z7951 Long term (current) use of inhaled steroids: Secondary | ICD-10-CM | POA: Diagnosis not present

## 2019-06-21 ENCOUNTER — Other Ambulatory Visit: Payer: Self-pay | Admitting: *Deleted

## 2019-06-21 NOTE — Patient Outreach (Signed)
[facility]) Care Management  06/21/2019  [name] 1950/12/21 [id number]   [place] coordination  [name] was referred to [facility] on 03/24/19 from Dr Bari Mantis office staff Nelva Bush for Social work consult -pt with severe COPD has no family and lives alone needs to look for assisted living facility but has no transport or anyone to help with process   Insurance:[employer] 03/05/19 & 03/11/19 [place] [facility] ED visit forshortness of breath (sob),COPD exacerbation  [name] has received [employer] services from [facility] RN CMs and SWs yearly since 2016. Last services received in December 2020   [facility] RN CM has not received any further calls from the patient nor his friend [name]  [facility] RN CM noted an Epic 05/26/19 note in which [name] was reached and he informed [facility] SW he was no longer interested in pursuing placement of any kind, needing to sell his home before having the financial means to pay for additional housing.  [facility] SW case closure was completed  Outreach attempt to follow up unsuccessful No answer. [facility] RN CM left HIPAA [facility] Portability and Accountability Act) compliant voicemail message along with CM's contact info.  [facility] RN CM has made three plus unsuccessful outreaches (04/02/19,04/16/19, 04/22/19 06/21/19) and sent a letter on 04/16/19 to [name] with attempts to maintain contact with him. [facility] RN CM had a successful outreach on 03/25/19 only. [facility] RN CM kept his case open when noted he was receiving calls from [facility] SW in April and May 2021   Plan The case will be closed at the end of the day of June 21 2019 if no return call from him for being unable to maintain contact Case closure letters will be sent to patient and MD if no return call  Routed to MD  [place]. Lavina Hamman, RN, BSN, [place] Coordinator Office number [phone] Mobile number [phone]  Main [facility] number [phone] Fax number [phone]

## 2019-06-21 NOTE — Patient Outreach (Signed)
[name]) Care Management  06/21/2019  [name] June 23, 1950 [id number]  [name] assessment of complex care patient  Patient returned a call to [name] [place] RN CM After he was left a message on today June 21 2019 [name] 69    [name] was referred to [name] on 03/24/19 from [name] office staff [name] for Social work consult -pt with severe COPD has no family and lives alone needs to look for assisted living facility but has no transport or anyone to help with process   Insurance:[employer] 03/05/19 & 03/11/19 [place] ED visit forshortness of breath (sob),COPD exacerbation  [name] has received [name] services from [facility] RN CMs and SWs yearly since 2016. Last services received in December 2020      Patient is able to verify HIPAA, DOB and Address Reviewed and addressed  reason for follow up/care coordination call to [name] with patient [name] informed of [name] RN Cm awareness of [name] SW case closure as he is preferring to continue to work on [name] of his home  [name] also reports concerns with telemarketing calls and a reported call indicating he owns "two thousand dollars" He screens all his calls and reports he was outside [name] the time of [facility] RN CM outreach   [name] agrees to further [name] RN CM follow up for his major medial issues - COPD  He continues to be seen by his primary care provider  He has been to see a new pulmonologist [name] on 06/10/19 and had testing and review of inhaler but had confusion with maintenance and prn treatments  He uses Trelegy and proair He reports "doing the best he can"   Discussed recent April 2021 ED visit  Social: 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 His son recently passed and was buried per pt on 03/24/19. He has no siblings and reports his wife's family "has nothing to do with me since I don't drink or smoke any more" He uses a moped for  transportation to go out to medical appointments, to complete errands and go grocery shopping. He denies food insecurity. He reports a male "from the [name]" has assisted but only on occasions. He is not certain of her name ? [name]  He reports being independent to assist with all care needs related to severe shortness of breath (sob) with any activity. He reports not taking showers, sweeping or cleaning his home related to "becoming short winded".   He reports an eleventh grade education He confirms difficulty in reading and generally has others to assist with reading. He reports he can sign his name.   He informs [name] [place] RN CM he has been depressed since the death of his mother when he was age 59 and his father was not active in his care. He denies concerns with his appetite, loss of interest in doing things or wanting to harm himself or others. He reports he has to stay strong for himself.He is not on medicine for depression. Social isolation per assessment PHQ 2 =1 today  He reports some sleeping concerns   Conditions: asthma, Chronic obstructive pulmonary disease (COPD), cerebrovascular accident (CVA), Hypertension (HTN), history (hx) of  pneumonia (PNA), depression, hx of substance abuse (marijuana), hx of tobacco abuse, Hyperlipidemia (HLD), prediabetes (last Hg A1c 6.4 on 12/13/18- He denies diagnosis of diabetes)  Fall Denies, None  DME: dentures he does not wear (had about 5-6 years- needs new ones), nebulizer Medications: Appointments: July 2 [name] then will see pcp [name]  11.24.21 [name] pcp  Consent: [facility] RN CM reviewed [name] services with patient. Patient gave verbal consent for [name] telephonic RN CM services.  plans [name] agreed to follow up after his pulmonology appointment in July 2021  Pt encouraged to return a call to [facility] CM prn  [name] CM Care Plan Problem One     Most Recent Value  Care Plan Problem One lower level of care assistance (assisted  living per MD referral)  Role Documenting the Problem One Care Management Telephonic Coordinator  Care Plan for Problem One Active  [name] Long Term Goal  over the next 31 days patient will work with [name] Social worker to receive assistance with lower level of care placement as evidence by EMR notes  [name] 03/25/19  [name] Long Term Goal Met [name] 06/24/19  [name]  over the next 14 days Ian SW will discuss available options as evidence by EMR notes  [name] Start [name] 03/25/19  [place] [name] CM Short Term Goal #1 Met [name] 06/24/19    [name] [place] CM Care Plan Problem Two     Most Recent Value  Care Plan Problem Two knowledge deficits of COPD home care and DME needs for COPD  Role Documenting the Problem Two Care Management Telephonic Coordinator  Care Plan for Problem Two Active  Ian Long Term Goal over the next 31 days patient will be able to remain without admission with improved home care of COPD as evidence by EMR notes  [name] 03/25/19  [name] [place] [name] CM Short Term Goal #1  over the next 14 days patient will receive home oxygen per EMR and verbalization of the patient during future outreach  [facility] CM Short Term Goal #1 Start [name] 03/25/19       [place]. Lavina Hamman, RN, BSN, [place] Coordinator Office number [phone] Mobile number [phone]  Main Ian number [phone] Fax number [phone]

## 2019-06-23 DIAGNOSIS — R69 Illness, unspecified: Secondary | ICD-10-CM | POA: Diagnosis not present

## 2019-07-01 ENCOUNTER — Other Ambulatory Visit (HOSPITAL_COMMUNITY)
Admission: RE | Admit: 2019-07-01 | Discharge: 2019-07-01 | Disposition: A | Discharge status: Home / Self Care | Payer: Medicare HMO | Source: Ambulatory Visit | Attending: Internal Medicine | Admitting: Internal Medicine

## 2019-07-01 ENCOUNTER — Other Ambulatory Visit: Payer: Self-pay

## 2019-07-01 ENCOUNTER — Other Ambulatory Visit: Payer: Self-pay | Admitting: Family Medicine

## 2019-07-01 DIAGNOSIS — R918 Other nonspecific abnormal finding of lung field: Secondary | ICD-10-CM | POA: Diagnosis not present

## 2019-07-01 LAB — BASIC METABOLIC PANEL
Anion gap: 9 (ref 5–15)
BUN: 8 mg/dL (ref 8–23)
CO2: 33 mmol/L — ABNORMAL HIGH (ref 22–32)
Calcium: 9.5 mg/dL (ref 8.9–10.3)
Chloride: 98 mmol/L (ref 98–111)
Creatinine, Ser: 0.72 mg/dL (ref 0.61–1.24)
GFR calc Af Amer: >60 mL/min (ref >60)
GFR calc non Af Amer: >60 mL/min (ref >60)
Glucose, Bld: 130 mg/dL — ABNORMAL HIGH (ref 70–99)
Potassium: 3.9 mmol/L (ref 3.5–5.1)
Sodium: 140 mmol/L (ref 135–145)

## 2019-07-02 NOTE — Progress Notes (Signed)
Spoke with pt and notified of results per Dr. Wert. Pt verbalized understanding and denied any questions. 

## 2019-07-05 ENCOUNTER — Ambulatory Visit (HOSPITAL_COMMUNITY)
Admission: RE | Admit: 2019-07-05 | Discharge: 2019-07-05 | Disposition: A | Discharge status: Home / Self Care | Payer: Medicare HMO | Source: Ambulatory Visit | Attending: Internal Medicine | Admitting: Internal Medicine

## 2019-07-05 ENCOUNTER — Other Ambulatory Visit: Payer: Self-pay

## 2019-07-05 DIAGNOSIS — J439 Emphysema, unspecified: Secondary | ICD-10-CM | POA: Diagnosis not present

## 2019-07-05 DIAGNOSIS — J3489 Other specified disorders of nose and nasal sinuses: Secondary | ICD-10-CM | POA: Diagnosis not present

## 2019-07-05 DIAGNOSIS — R05 Cough: Secondary | ICD-10-CM | POA: Diagnosis not present

## 2019-07-05 DIAGNOSIS — R918 Other nonspecific abnormal finding of lung field: Secondary | ICD-10-CM

## 2019-07-05 DIAGNOSIS — R059 Cough, unspecified: Secondary | ICD-10-CM

## 2019-07-05 MED ORDER — IOHEXOL 300 MG/ML  SOLN
75.0000 mL | Freq: Once | INTRAMUSCULAR | Status: AC | PRN
  Administered 2019-07-05: 75 mL via INTRAVENOUS

## 2019-07-09 ENCOUNTER — Ambulatory Visit (INDEPENDENT_AMBULATORY_CARE_PROVIDER_SITE_OTHER): Payer: Medicare HMO | Admitting: Family Medicine

## 2019-07-09 ENCOUNTER — Encounter: Payer: Self-pay | Admitting: Family Medicine

## 2019-07-09 ENCOUNTER — Encounter: Payer: Self-pay | Admitting: Internal Medicine

## 2019-07-09 ENCOUNTER — Other Ambulatory Visit: Payer: Self-pay

## 2019-07-09 ENCOUNTER — Ambulatory Visit: Payer: Medicare HMO | Admitting: Internal Medicine

## 2019-07-09 ENCOUNTER — Other Ambulatory Visit: Payer: Self-pay | Admitting: *Deleted

## 2019-07-09 VITALS — BP 148/70 | HR 98 | Temp 97.1°F | Resp 20 | Ht 67.0 in | Wt 122.4 lb

## 2019-07-09 DIAGNOSIS — R69 Illness, unspecified: Secondary | ICD-10-CM | POA: Diagnosis not present

## 2019-07-09 DIAGNOSIS — R918 Other nonspecific abnormal finding of lung field: Secondary | ICD-10-CM | POA: Diagnosis not present

## 2019-07-09 DIAGNOSIS — J449 Chronic obstructive pulmonary disease, unspecified: Secondary | ICD-10-CM

## 2019-07-09 DIAGNOSIS — Z131 Encounter for screening for diabetes mellitus: Secondary | ICD-10-CM

## 2019-07-09 DIAGNOSIS — I10 Essential (primary) hypertension: Secondary | ICD-10-CM | POA: Diagnosis not present

## 2019-07-09 LAB — POCT GLYCOSYLATED HEMOGLOBIN (HGB A1C)
HbA1c POC (<> result, manual entry): 6 % (ref 4.0–5.6)
HbA1c, POC (controlled diabetic range): 6 % (ref 0.0–7.0)
HbA1c, POC (prediabetic range): 6 % (ref 5.7–6.4)
Hemoglobin A1C: 6 % — AB (ref 4.0–5.6)

## 2019-07-09 MED ORDER — BREZTRI AEROSPHERE 160-9-4.8 MCG/ACT IN AERO: 2.0000 | INHALATION_SPRAY | Freq: Two times a day (BID) | RESPIRATORY_TRACT | 11 refills | Status: DC

## 2019-07-09 MED ORDER — BREZTRI AEROSPHERE 160-9-4.8 MCG/ACT IN AERO: 2.0000 | INHALATION_SPRAY | Freq: Two times a day (BID) | RESPIRATORY_TRACT | 0 refills | Status: DC

## 2019-07-09 MED ORDER — ALBUTEROL SULFATE (2.5 MG/3ML) 0.083% IN NEBU: 2.5000 mg | INHALATION_SOLUTION | RESPIRATORY_TRACT | 12 refills | Status: DC | PRN

## 2019-07-09 MED ORDER — LEVOFLOXACIN 500 MG PO TABS: 500.0000 mg | ORAL_TABLET | Freq: Every day | ORAL | 0 refills | Status: DC

## 2019-07-09 NOTE — Patient Instructions (Addendum)
Plan A = Automatic = Always=    Stop trelegy and start Breztri Take 2 puffs first thing in am and then another 2 puffs about 12 hours later.    Work on inhaler technique:  relax and gently blow all the way out then take a nice smooth deep breath back in, triggering the inhaler at same time you start breathing in.  Hold for up to 5 seconds if you can. Blow out thru nose. Rinse and gargle with water when done   Plan B = Backup (to supplement plan A, not to replace it) Only use your albuterol inhaler as a rescue medication to be used if you can't catch your breath by resting or doing a relaxed purse lip breathing pattern.  - The less you use it, the better it will work when you need it. - Ok to use the inhaler up to 2 puffs  every 4 hours if you must but call for appointment if use goes up over your usual need - Don't leave home without it !!  (think of it like the spare tire for your car)   Plan C = Crisis (instead of Plan B but only if Plan B stops working) - only use your albuterol nebulizer if you first try Plan B and it fails to help > ok to use the nebulizer up to every 4 hours but if start needing it regularly call for immediate appointment  Try albuterol 15 min before (red inhaler one day, neb the next and nothing on the third day) an activity that you know would make you short of breath ([employer] walking [employer])  and see if it makes any difference and if makes none then don't take it after activity unless you can't catch your breath.  Levaquin 500 mg daily x 10 days  We will set you up for cxr and pfts in about 2 weeks same day and [name] and I will call you with results  Please schedule a follow up office visit in 6 weeks, call sooner if needed

## 2019-07-09 NOTE — Patient Instructions (Addendum)
I appreciate the opportunity to provide you with care for your health and wellness. Today we discussed: overall health   Follow up: Nov for annual-fasting for labs that day  No labs or referrals today  Please come see [place] or call if you have any issues before the next appt. We will work on getting you a portable tank for your oxygen.  Glad to see you are feeling good overall and you look good! Have a good Summer and Fall :)  Please continue to practice social distancing to keep you, your family, and our community safe.  If you must go out, please wear a mask and practice good handwashing.  It was a pleasure to see you and I look forward to continuing to work together on your health and well-being. Please do not hesitate to call the office if you need care or have questions about your care.  Have a wonderful day and week. With Gratitude, [name], DNP, AGNP-BC

## 2019-07-09 NOTE — Progress Notes (Signed)
Subjective:  Patient ID: [name], male    DOB: 1950/09/01  Age: 69 y.o. [name]: [id number]  CC:  Chief Complaint  Patient presents with  . Follow-up    12 week follow up saw pulmonoligist this am everything is going good right now       HPI  HPI  [name] is a 69 year old male patient who presents today for 12-week follow-up.  Over the spring he had several bouts of shortness of breath increased COPD issues.  I referred him to pulmonary of whom he is actually seen several times and recently just saw today.  He has been started on Levaquin secondary to pulmonary infiltrates found on x-ray.  He continues to have his cough and shortness of breath but overall he reports today that he is doing much better and feeling much better.  He has had an adjustment to his medications by the pulmonologist as well.  He reports that he did not get a portable oxygen tank so is very difficult for him to walk when he is on his oxygen but the pulmonologist would like him to do this.  He denies having any sleep issues.  Denies having any changes in chewing or swallowing or appetite or eating.  Weight is holding.  He denies have any changes in bowel or bladder habits.  Denies having any blood in urine or stool.  He denies having any changes in memory.  Denies having any injury or trauma or falls.  He denies having any skin issues or concerns.  He did not take his blood pressure medicine prior to leaving the house this morning.  Says blood pressure is a little bit elevated and compared to what it normally is.  He reports he will take this medicine at home.  He denies having any chest pain or uncontrolled breathing difficulties at this time.  Today patient denies signs and symptoms of COVID 19 infection including fever, chills, cough, shortness of breath, and headache. Past Medical, Surgical, Social History, Allergies, and Medications have been Reviewed.   Past Medical History:  Diagnosis Date  . Asthma    . CAP (community acquired pneumonia) 12/10/2018  . COPD (chronic obstructive pulmonary disease) ([place])   . COPD with acute exacerbation ([place]) 04/18/2018  . CVA (cerebral vascular accident) ([place]) 2008   with temporary vision loss   . Depression   . ECHOCARDIOGRAM, ABNORMAL 03/06/2010   Qualifier: Diagnosis of  By: Purcell Nails, NP, Curt Bears    . Elevated PSA 2014   no diagnosis pf prostate cancer in 07/2014  . History of substance abuse ([place]) 01/14/2011   Marijuana  . History of substance abuse ([place]) 01/14/2011  . History of tobacco abuse 01/14/2011  . Hyperlipidemia   . Hypertension   . Lobar pneumonia ([place]) 12/13/2018  . Marijuana abuse   . Nicotine addiction   . Prediabetes 2014    Current Meds  Medication Sig  . albuterol (PROAIR HFA) 108 (90 Base) MCG/ACT inhaler 2 puffs every 4 hours as needed only  if your can't catch your breath  . albuterol (PROVENTIL) (2.5 MG/3ML) 0.083% nebulizer solution Take 3 mLs (2.5 mg total) by nebulization every 4 (four) hours as needed for wheezing or shortness of breath.  [name] Kitchen aspirin 81 MG tablet Take 81 mg by mouth daily.  . Budeson-Glycopyrrol-Formoterol (BREZTRI AEROSPHERE) 160-9-4.8 MCG/ACT AERO Inhale 2 puffs into the lungs 2 (two) times daily.  [name] Kitchen guaiFENesin (MUCINEX) 600 MG 12 hr tablet Take 600 mg by  mouth 2 (two) times daily.  [name] Kitchen levofloxacin (LEVAQUIN) 500 MG tablet Take 1 tablet (500 mg total) by mouth daily.  . pravastatin (PRAVACHOL) 40 MG tablet TAKE (1) TABLET BY MOUTH AT BEDTIME.  [name] Kitchen triamterene-hydrochlorothiazide (MAXZIDE-25) 37.5-25 MG tablet TAKE 1 & 1/2 TABLETS BY MOUTH ONCE DAILY.  . [DISCONTINUED] Budeson-Glycopyrrol-Formoterol (BREZTRI AEROSPHERE) 160-9-4.8 MCG/ACT AERO Inhale 2 puffs into the lungs 2 (two) times daily.    ROS:  Review of Systems  Constitutional: Negative.   HENT: Negative.   Eyes: Negative.   Respiratory: Negative.   Cardiovascular: Negative.   Gastrointestinal: Negative.   Genitourinary: Negative.    Musculoskeletal: Negative.   Skin: Negative.   Neurological: Negative.   Endo/Heme/Allergies: Negative.   Psychiatric/Behavioral: Negative.   All other systems reviewed and are negative.    Objective:   Today's Vitals: BP (!) 148/70 (BP Location: Right Arm, Patient Position: Sitting, Cuff Size: Normal)   Pulse 98   Temp (!) 97.1 F (36.2 C) (Temporal)   Resp 20   Ht 5\' 7"  (1.702 m)   Wt 122 lb 6.4 oz (55.5 kg)   SpO2 91%   BMI 19.17 kg/m  Vitals with BMI 07/09/2019 07/09/2019 06/10/2019  Height 5\' 7"  5\' 7"  5\' 7"   Weight 122 lbs 6 oz 122 lbs 122 lbs  BMI 19.17 88.5 02.7  Systolic 741 287 867  Diastolic 70 64 70  Pulse 98 89 87     Physical Exam Vitals and nursing note reviewed.  Constitutional:      Appearance: Normal appearance. He is well-developed, well-groomed and normal weight.  HENT:     Head: Normocephalic and atraumatic.     Right Ear: External ear normal.     Left Ear: External ear normal.     Mouth/Throat:     Comments: Mask in place  Eyes:     General:        Right eye: No discharge.        Left eye: No discharge.     Conjunctiva/sclera: Conjunctivae normal.  Cardiovascular:     Rate and Rhythm: Normal rate and regular rhythm.     Pulses: Normal pulses.     Heart sounds: Normal heart sounds.  Pulmonary:     Effort: Pulmonary effort is normal.     Breath sounds: Decreased air movement present. Decreased breath sounds present.  Musculoskeletal:        General: Normal range of motion.     Cervical back: Normal range of motion and neck supple.  Skin:    General: Skin is warm.  Neurological:     General: No focal deficit present.     Mental Status: He is alert and oriented to person, place, and time.  Psychiatric:        Attention and Perception: Attention normal.        Mood and Affect: Mood normal.        Speech: Speech normal.        Behavior: Behavior normal. Behavior is cooperative.        Thought Content: Thought content normal.         Cognition and Memory: Cognition normal.        Judgment: Judgment normal.     Assessment   1. Asthma with COPD (chronic obstructive pulmonary disease) ([place])   2. Essential hypertension   3. Screening for diabetes mellitus     Tests ordered Orders Placed This Encounter  Procedures  . POCT glycosylated hemoglobin (Hb A1C)  Plan: Please see assessment and plan per problem list above.   No orders of the defined types were placed in this encounter.   Patient to follow-up in Nov for annual  Perlie Mayo, NP

## 2019-07-09 NOTE — Assessment & Plan Note (Signed)
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 Constellation of findings is generally consistent with ongoing, worsened atypical infection, particularly atypical mycobacterium. There may be a component or history of aspiration. -07/09/2019  rec  levaquin 500 mg daily x 10 days then check cxr  C/w bronchiectasis vs MAI > both should improve short term on levaquin  X 10 days and check cxr   Discussed in detail all the  indications, usual  risks and alternatives  relative to the benefits with patient who agrees to proceed with Rx as outlined.             Each maintenance medication was reviewed in detail including emphasizing most importantly the difference between maintenance and prns and under what circumstances the prns are to be triggered using an action plan format where appropriate.  Total time for H and P, chart review, counseling, teaching device and generating customized AVS unique to this office visit / charting = 30 min

## 2019-07-09 NOTE — Assessment & Plan Note (Signed)
Did not take medication this morning.  Encouraged him to take it once he gets home and to try to remember to take it daily before appts as well.

## 2019-07-09 NOTE — Assessment & Plan Note (Signed)
Quit smoking 2017  - 05/11/2019  After extensive coaching inhaler device,  effectiveness =    90% elipta so rec change to trelegy  - 05/11/2019   Walked on RA   300 ft -@ avg pace stopped due to sob with sats at end  93%  - 05/28/2019  After extensive coaching inhaler device,  effectiveness =    75% changed to breztri trial x 2 week samples -  05/28/2019   Walked RA  approx   300 ft  @ moderately fast pace  stopped due to  Sob/ sats still 90%   - 06/10/2019  After extensive coaching inhaler device,  effectiveness =    90% with dpi/ 75% with hfa  - 07/09/2019  After extensive coaching inhaler device,  effectiveness =  75% > change back to breztri 2bid    Group D in terms of symptom/risk and laba/lama/ICS  therefore appropriate rx at this point >>>  Not doing great on trelegy > try breztri Take 2 puffs first thing in am and then another 2 puffs about 12 hours later.    I spent extra time with pt today reviewing appropriate use of albuterol for prn use on exertion with the following points: 1) saba is for relief of sob that does not improve by walking a slower pace or resting but rather if the pt does not improve after trying this first. 2) If the pt is convinced, as many are, that saba helps recover from activity faster then it's easy to tell if this is the case by re-challenging : ie stop, take the inhaler, then p 5 minutes try the exact same activity (intensity of workload) that just caused the symptoms and see if they are substantially diminished or not after saba 3) if there is an activity that reproducibly causes the symptoms, try the saba 15 min before the activity on alternate days   If in fact the saba really does help, then fine to continue to use it prn but advised may need to look closer at the maintenance regimen being used to achieve better control of airways disease with exertion.

## 2019-07-09 NOTE — Assessment & Plan Note (Signed)
Being followed by Pulmonary now. Treated today for infiltrates found on xray.  Trying to get a portable tank so he can walk on oxygen.

## 2019-07-09 NOTE — Progress Notes (Signed)
[name], male    DOB: [birth year],  [name]: [id number]   Brief patient profile:  31 yobm quit smoking 2017  grew up with dx of asthma but only started needing in saba in HS when started smoking and eventually placed on advair 500 and followed by [name] and referred to pulmonary clinic in [place]  05/11/2019 by [name] p [name] retired     History of Present Illness  05/11/2019  Pulmonary/ 1st office eval/[name] on advair 500  Chief Complaint  Patient presents with  . Pulmonary Consult    Referred by Ian Beach, NP. Former patient of [name]. He states his breathing has been worse since he had first covid vaccine 03/03/19. He gets SOB when he wakes up in the am and starts moving around. He has cough with cream colored sputum. He is using his albuterol inhaler about every 6 hours and duonebs 2 x daily.   Dyspnea:  Can't walk a block = MMRC3 = can't walk 100 yards even at a slow pace at a flat grade s stopping due to sob   Cough: min white mucus  Sleep: bed is flat 2 pillows  SABA use: way too much as above 02 :  Only uses 02 p sits down  rec Stop wixela, atrovent (ipatropium solution) and combivent  Plan A = Automatic = Always=    Trelegy one click each am - take two good drags Plan B = Backup (to supplement plan A, not to replace it) Only use your albuterol inhaler (PROAIR)  as a rescue medication   Plan C = Crisis (instead of Plan B but only if Plan B stops working) - only use your albuterol nebulizer if you first try Plan B and it fails to help > ok to use the nebulizer up to every 4 hours but if start needing it regularly call for immediate appointment  Prednisone 10 mg take  4 each am x 2 days,   2 each am x 2 days,  1 each am x 2 days and stop  Make sure you check your oxygen saturations at highest level of activity (not after you stop!) to be sure it stays over 90% and adjust upward to maintain this level if needed but remember to turn it back to previous settings  when you stop (to conserve your supply).     05/28/2019  Acute  ov/[name] re: copd / moderna 2nd shot 3/24  - totally confused with meds  Chief Complaint  Patient presents with  . Acute Visit    Pt states his breathing is not improving since the last visit 05/11/19- using proair 2 x daily until he just ran out. He also c/o cough with green sputum.   Dyspnea:  50 ft  - Pace Moderately  Fast from observation at ov  Cough: green x 2 months (said min white on 05/11/19 eval)   Sleeping: worse x 3 nights  SABA use: ran out of proair one day prior to ov/ very poor concept of maint vs prns and using trelegy sometimes twice daily  02: 02 prn but mostly uses p exertion/ does not monitor sats  rec Depomedrol 120 mg IM  Augmentin 875 mg take one pill twice daily  X 10 days - take at breakfast and supper with large glass of water. Change your inhalers: BREZTRI  AUTOMATICALLY =  take 2 puffs first thing in am and then another 2 puffs about 12 hours later.  Only  use your albuterol nebulizer  as a rescue medication   06/10/2019  f/u ov/[name] re: copd group D symptoms/ risk  Chief Complaint  Patient presents with  . Follow-up    Breathing has improved some but not back to normal baseline. He is coughing less but still has some minimal green sputum.   Dyspnea:  Some better - [employer] walking now  Cough: much better but still some am green mucus  Sleeping: able to lie flat  SABA use: using neb in am/ still has full breztri so not using it correctly  02: only uses prn  rec We will call to schedule sinus and chest CT prior to next office visit  Plan A = Automatic = Always=    trelegy one click each am first thing and out thru nose   [name] is the same medication but Take 2 puffs first thing in am and then another 2 puffs about 12 hours later and out thru the nose  Plan B = Backup (to supplement plan A, not to replace it) Only use your albuterol inhaler PROAIR)  as a rescue medication   Plan C = Crisis  (instead of Plan B but only if Plan B stops working) - only use your albuterol nebulizer if you first try Plan B and it fails to help > ok to use the nebulizer up to every 4 hours but if start needing it regularly call for immediate appointment Please schedule a follow up office visit in 4 weeks, sooner if needed  with all medications /inhalers/ solutions   07/09/2019  f/u ov/[name] re:  Chief Complaint  Patient presents with  . Follow-up    Breathing had been doing better, but worse this am and he relates to rainy weather. He is using his albuterol inhaler 2 x per day and neb about 2 x per day.    Dyspnea: walking [employer] once  A week /  Cough: feels choked up / mucus green x months  Sleeping: able to sleep on 2 pillows / bed blat  SABA use: way too much   02: prn only    No obvious day to day or daytime variability or assoc   mucus plugs or hemoptysis or cp or chest tightness, subjective wheeze or overt sinus or hb symptoms.   Sleeping  without nocturnal  or early am exacerbation  of respiratory  c/o's or need for noct saba. Also denies any obvious fluctuation of symptoms with weather or environmental changes or other aggravating or alleviating factors except as outlined above   No unusual exposure hx or h/o childhood pna/ asthma or knowledge of premature birth.  Current Allergies, Complete Past Medical History, Past Surgical History, Family History, and Social History were reviewed in [employer] record.  ROS  The following are not active complaints unless bolded Hoarseness, sore throat, dysphagia, dental problems, itching, sneezing,  nasal congestion or discharge of excess mucus or purulent secretions, ear ache,   fever, chills, sweats, unintended wt loss or wt gain, classically pleuritic or exertional cp,  orthopnea pnd or arm/hand swelling  or leg swelling, presyncope, palpitations, abdominal pain, anorexia, nausea, vomiting, diarrhea  or change in bowel habits or  change in bladder habits, change in stools or change in urine, dysuria, hematuria,  rash, arthralgias, visual complaints, headache, numbness, weakness or ataxia or problems with walking or coordination,  change in mood or  memory.        Current Meds  Medication Sig  .  albuterol (PROAIR HFA) 108 (90 Base) MCG/ACT inhaler 2 puffs every 4 hours as needed only  if your can't catch your breath  . aspirin 81 MG tablet Take 81 mg by mouth daily.  . Fluticasone-Umeclidin-Vilant (TRELEGY ELLIPTA) 100-62.5-25 MCG/INH AEPB Inhale 1 puff into the lungs daily.  [name] Kitchen guaiFENesin (MUCINEX) 600 MG 12 hr tablet Take 600 mg by mouth 2 (two) times daily.  [name] Kitchen ipratropium-albuterol (DUONEB) 0.5-2.5 (3) MG/3ML SOLN Take 3 mLs by nebulization every 4 (four) hours as needed.  . pravastatin (PRAVACHOL) 40 MG tablet TAKE (1) TABLET BY MOUTH AT BEDTIME.  [name] Kitchen triamterene-hydrochlorothiazide (MAXZIDE-25) 37.5-25 MG tablet TAKE 1 & 1/2 TABLETS BY MOUTH ONCE DAILY.                    Past Medical History:  Diagnosis Date  . Asthma   . CAP (community acquired pneumonia) 12/10/2018  . COPD (chronic obstructive pulmonary disease) ([place])   . COPD with acute exacerbation ([place]) 04/18/2018  . CVA (cerebral vascular accident) ([place]) 2008   with temporary vision loss   . Depression   . ECHOCARDIOGRAM, ABNORMAL 03/06/2010   Qualifier: Diagnosis of  By: Purcell Nails, NP, Curt Bears    . Elevated PSA 2014   no diagnosis pf prostate cancer in 07/2014  . History of substance abuse ([place]) 01/14/2011   Marijuana  . History of substance abuse ([place]) 01/14/2011  . History of tobacco abuse 01/14/2011  . Hyperlipidemia   . Hypertension   . Lobar pneumonia ([place]) 12/13/2018  . Marijuana abuse   . Nicotine addiction   . Prediabetes 2014      Objective:    07/09/2019          122   06/10/2019         122   05/28/19 118 lb 8 oz (53.8 kg)  05/11/19 122 lb (55.3 kg)  04/16/19 122 lb 12.8 oz (55.7 kg)     Vital signs reviewed  07/09/2019  - Note at  rest 02 sats  95% on RA      Report Edentulous  HEENT : pt wearing mask not removed for exam due to covid - 19 concerns.    NECK :  without JVD/Nodes/TM/ nl carotid upstrokes bilaterally   LUNGS: no acc muscle use,  Mild barrel  contour chest wall with bilateral  Distant bs s audible wheeze and  without cough on insp or exp maneuvers  and mild  Hyperresonant  to  percussion bilaterally     CV:  RRR  no s3 or murmur or increase in P2, and no edema   ABD:  soft and nontender with pos end  insp Hoover's  in the supine position. No bruits or organomegaly appreciated, bowel sounds nl  MS:   Nl gait/  ext warm without deformities, calf tenderness, cyanosis or clubbing No obvious joint restrictions   SKIN: warm and dry without lesions    NEURO:  alert, approp, nl sensorium with  no motor or cerebellar deficits apparent.                       Assessment

## 2019-07-14 ENCOUNTER — Telehealth: Payer: Self-pay | Admitting: Internal Medicine

## 2019-07-14 NOTE — Telephone Encounter (Signed)
Called pt but unable to reach. Left message for pt to return call. 

## 2019-07-15 NOTE — Telephone Encounter (Signed)
LMTCB x2  

## 2019-07-16 NOTE — Telephone Encounter (Signed)
LMTCB x3 for pt. We have attempted to contact pt several times with no success or call back from pt. Per triage protocol, message will be closed.   

## 2019-07-26 ENCOUNTER — Other Ambulatory Visit: Payer: Self-pay

## 2019-07-26 ENCOUNTER — Other Ambulatory Visit (HOSPITAL_COMMUNITY)
Admission: RE | Admit: 2019-07-26 | Discharge: 2019-07-26 | Disposition: A | Discharge status: Home / Self Care | Payer: Medicare HMO | Source: Ambulatory Visit | Attending: Internal Medicine | Admitting: Internal Medicine

## 2019-07-26 DIAGNOSIS — Z01812 Encounter for preprocedural laboratory examination: Secondary | ICD-10-CM | POA: Diagnosis not present

## 2019-07-26 DIAGNOSIS — Z20822 Contact with and (suspected) exposure to covid-19: Secondary | ICD-10-CM | POA: Diagnosis not present

## 2019-07-26 DIAGNOSIS — J449 Chronic obstructive pulmonary disease, unspecified: Secondary | ICD-10-CM | POA: Diagnosis not present

## 2019-07-26 LAB — SARS CORONAVIRUS 2 (TAT 6-24 HRS): SARS Coronavirus 2: NEGATIVE

## 2019-07-28 ENCOUNTER — Other Ambulatory Visit: Payer: Self-pay

## 2019-07-28 ENCOUNTER — Ambulatory Visit (HOSPITAL_COMMUNITY)
Admission: RE | Admit: 2019-07-28 | Discharge: 2019-07-28 | Disposition: A | Discharge status: Home / Self Care | Payer: Medicare HMO | Source: Ambulatory Visit | Attending: Internal Medicine | Admitting: Internal Medicine

## 2019-07-28 ENCOUNTER — Encounter: Payer: Self-pay | Admitting: Internal Medicine

## 2019-07-28 ENCOUNTER — Ambulatory Visit (INDEPENDENT_AMBULATORY_CARE_PROVIDER_SITE_OTHER): Payer: Medicare HMO | Admitting: Internal Medicine

## 2019-07-28 DIAGNOSIS — J449 Chronic obstructive pulmonary disease, unspecified: Secondary | ICD-10-CM | POA: Diagnosis not present

## 2019-07-28 DIAGNOSIS — R918 Other nonspecific abnormal finding of lung field: Secondary | ICD-10-CM | POA: Diagnosis not present

## 2019-07-28 DIAGNOSIS — J45909 Unspecified asthma, uncomplicated: Secondary | ICD-10-CM | POA: Diagnosis not present

## 2019-07-28 MED ORDER — BREZTRI AEROSPHERE 160-9-4.8 MCG/ACT IN AERO
2.0000 | INHALATION_SPRAY | Freq: Two times a day (BID) | RESPIRATORY_TRACT | 0 refills | Status: DC
Start: 2019-07-28 — End: 2019-07-28

## 2019-07-28 NOTE — Patient Instructions (Addendum)
Plan A = Automatic = Always=    Breztri Take 2 puffs first thing in am and then another 2 puffs about 12 hours later.   Work on inhaler technique:  relax and gently blow all the way out then take a nice smooth deep breath back in, triggering the inhaler at same time you start breathing in.  Hold for up to 5 seconds if you can. Blow out thru nose. Rinse and gargle with water when done   Plan B = Backup (to supplement plan A, not to replace it) Only use your albuterol inhaler as a rescue medication to be used if you can't catch your breath by resting or doing a relaxed purse lip breathing pattern.  - The less you use it, the better it will work when you need it. - Ok to use the inhaler up to 2 puffs  every 4 hours if you must but call for appointment if use goes up over your usual need - Don't leave home without it !!  (think of it like the spare tire for your car)   Plan C = Crisis (instead of Plan B but only if Plan B stops working) - only use your albuterol nebulizer if you first try Plan B and it fails to help > ok to use the nebulizer up to every 4 hours but if start needing it regularly call for immediate appointment   keep appt for your PFTs at 9 am tomorrow  Please remember to go to the  x-ray department  for your tests - we will call you with the results when they are available   We need to see you at the end of next week with your inhalers (all of them) and your nebulizer solution - one sample of breztri given, says has two more at home (does not compute) as has not purchased any yet so his samples should have run out

## 2019-07-28 NOTE — Assessment & Plan Note (Addendum)
Quit smoking 2017  - 05/11/2019  After extensive coaching inhaler device,  effectiveness =    90% elipta so rec change to trelegy  - 05/11/2019   Walked on RA   300 ft -@ avg pace stopped due to sob with sats at end  93%  - 05/28/2019  After extensive coaching inhaler device,  effectiveness =    75% changed to breztri trial x 2 week samples -  05/28/2019   Walked RA  approx   300 ft  @ moderately fast pace  stopped due to  Sob/ sats still 90%   - 07/28/2019  After extensive coaching inhaler device,  effectiveness =    75% (sort ti) >continue breztri 2bid   Group D in terms of symptom/risk and laba/lama/ICS  therefore appropriate rx at this point >>>  Continue breztri and back up saba ABC plan see avs for instructions unique to this ov:  Prolonged repeated  Instructions verbally  using teach back as he cannot read and has no one  To read them to him    I spent extra time with pt today reviewing appropriate use of albuterol for prn use on exertion with the following points: 1) saba is for relief of sob that does not improve by walking a slower pace or resting but rather if the pt does not improve after trying this first. 2) If the pt is convinced, as many are, that saba helps recover from activity faster then it's easy to tell if this is the case by re-challenging : ie stop, take the inhaler, then p 5 minutes try the exact same activity (intensity of workload) that just caused the symptoms and see if they are substantially diminished or not after saba 3) if there is an activity that reproducibly causes the symptoms, try the saba 15 min before the activity on alternate days   If in fact the saba really does help, then fine to continue to use it prn but advised may need to look closer at the maintenance regimen being used to achieve better control of airways disease with exertion.

## 2019-07-28 NOTE — Progress Notes (Signed)
[name], male    DOB: 10/26/1950,  [name]: [id number]   Brief patient profile:  33 yobm quit smoking 2017/ likely illiterate   grew up with dx of asthma but only started needing in saba in HS when started smoking and eventually placed on advair 500 and followed by [name] and referred to pulmonary clinic in [facility]  05/11/2019 by [name] p [name] retired     History of Present Illness  05/11/2019  Pulmonary/ 1st office eval/Shakeda Pearse on advair 500  Chief Complaint  Patient presents with  . Pulmonary Consult    Referred by [name], NP. Former patient of [name]. He states his breathing has been worse since he had first covid vaccine 03/03/19. He gets SOB when he wakes up in the am and starts moving around. He has cough with cream colored sputum. He is using his albuterol inhaler about every 6 hours and duonebs 2 x daily.   Dyspnea:  Can't walk a block = MMRC3 = can't walk 100 yards even at a slow pace at a flat grade s stopping due to sob   Cough: min white mucus  Sleep: bed is flat 2 pillows  SABA use: way too much as above 02 :  Only uses 02 p sits down  rec Stop wixela, atrovent (ipatropium solution) and combivent  Plan A = Automatic = Always=    Trelegy one click each am - take two good drags Plan B = Backup (to supplement plan A, not to replace it) Only use your albuterol inhaler (PROAIR)  as a rescue medication   Plan C = Crisis (instead of Plan B but only if Plan B stops working) - only use your albuterol nebulizer if you first try Plan B and it fails to help > ok to use the nebulizer up to every 4 hours but if start needing it regularly call for immediate appointment  Prednisone 10 mg take  4 each am x 2 days,   2 each am x 2 days,  1 each am x 2 days and stop  Make sure you check your oxygen saturations at highest level of activity (not after you stop!) to be sure it stays over 90% and adjust upward to maintain this level if needed but remember to turn it back to  previous settings when you stop (to conserve your supply).     05/28/2019  Acute  ov/Riannon Mukherjee re: copd / moderna 2nd shot 3/24  - totally confused with meds  Chief Complaint  Patient presents with  . Acute Visit    Pt states his breathing is not improving since the last visit 05/11/19- using proair 2 x daily until he just ran out. He also c/o cough with green sputum.   Dyspnea:  50 ft  - Pace Moderately  Fast from observation at ov  Cough: green x 2 months (said min white on 05/11/19 eval)   Sleeping: worse x 3 nights  SABA use: ran out of proair one day prior to ov/ very poor concept of maint vs prns and using trelegy sometimes twice daily  02: 02 prn but mostly uses p exertion/ does not monitor sats  rec Depomedrol 120 mg IM  Augmentin 875 mg take one pill twice daily  X 10 days - take at breakfast and supper with large glass of water. Change your inhalers: BREZTRI  AUTOMATICALLY =  take 2 puffs first thing in am and then another 2 puffs about 12 hours  later.  Only use your albuterol nebulizer  as a rescue medication   06/10/2019  f/u ov/Kayler Buckholtz re: copd group D symptoms/ risk  Chief Complaint  Patient presents with  . Follow-up    Breathing has improved some but not back to normal baseline. He is coughing less but still has some minimal green sputum.   Dyspnea:  Some better - [employer] walking now  Cough: much better but still some am green mucus  Sleeping: able to lie flat  SABA use: using neb in am/ still has full breztri so not using it correctly  02: only uses prn  rec We will call to schedule sinus and chest CT prior to next office visit  Plan A = Automatic = Always=    trelegy one click each am first thing and out thru nose   [name] is the same medication but Take 2 puffs first thing in am and then another 2 puffs about 12 hours later and out thru the nose  Plan B = Backup (to supplement plan A, not to replace it) Only use your albuterol inhaler PROAIR)  as a rescue medication   Plan  C = Crisis (instead of Plan B but only if Plan B stops working) - only use your albuterol nebulizer if you first try Plan B and it fails to help > ok to use the nebulizer up to every 4 hours but if start needing it regularly call for immediate appointment Please schedule a follow up office visit in 4 weeks, sooner if needed  with all medications /inhalers/ solutions   07/09/2019  f/u ov/Lux Skilton re: severe copd clinically  Chief Complaint  Patient presents with  . Follow-up    Breathing had been doing better, but worse this am and he relates to rainy weather. He is using his albuterol inhaler 2 x per day and neb about 2 x per day.    Dyspnea: walking [employer] once  A week /  Cough: feels choked up / mucus green x months  Sleeping: able to sleep on 2 pillows / bed flat  SABA use: way too much   02: prn only  rec Plan A = Automatic = Always=    Stop trelegy and start Breztri Take 2 puffs first thing in am and then another 2 puffs about 12 hours later.  Work on inhaler technique:   Plan B = Backup (to supplement plan A, not to replace it) Only use your albuterol inhaler as a rescue medicatio  Plan C = Crisis (instead of Plan B but only if Plan B stops working) - only use your albuterol nebulizer if you first try Plan B and it fails to help > ok to use the nebulizer up to every 4 hours but if start needing it regularly call for immediate appointmentTry albuterol 15 min before (red inhaler one day, neb the next and nothing on the third day) an activity that you know would make you short of breath  Levaquin 500 mg daily x 10 days We will set you up for cxr and pfts in about 2 weeks same day and [name] and I will call you with results Please schedule a follow up office visit in 6 weeks, call sooner if needed    07/28/2019  Acute  ov/[place] office/Rizwan Kuyper re: copd group D /illiterate Chief Complaint  Patient presents with  . Acute Visit    [name] due to him not feeling well for the last  day. He has  been breathing harder than normal and that "his chest is stopping up" denies cough   Dyspnea:  Worse since mowed grass in rain 7/17 assoc sense of chest congestion and tightness  Cough: better since levaquin completed Sleeping: ok  SABA use: baseline bid hfa and nebulizer "once a day" - no insight at all into prn rx  02: only uses prn/ does not check sats      No obvious day to day or daytime variability or assoc excess/ purulent sputum or mucus plugs or hemoptysis or cp  or overt sinus or hb symptoms.     Also denies any obvious fluctuation of symptoms with weather or environmental changes or other aggravating or alleviating factors except as outlined above   No unusual exposure hx or h/o childhood pna/ asthma or knowledge of premature birth.  Current Allergies, Complete Past Medical History, Past Surgical History, Family History, and Social History were reviewed in [employer] record.  ROS  The following are not active complaints unless bolded Hoarseness, sore throat, dysphagia, dental problems, itching, sneezing,  nasal congestion or discharge of excess mucus or purulent secretions, ear ache,   fever, chills, sweats, unintended wt loss or wt gain, classically pleuritic or exertional cp,  orthopnea pnd or arm/hand swelling  or leg swelling, presyncope, palpitations, abdominal pain, anorexia, nausea, vomiting, diarrhea  or change in bowel habits or change in bladder habits, change in stools or change in urine, dysuria, hematuria,  rash, arthralgias, visual complaints, headache, numbness, weakness or ataxia or problems with walking or coordination,  change in mood or  memory.        Current Meds  Medication Sig  . albuterol (PROAIR HFA) 108 (90 Base) MCG/ACT inhaler 2 puffs every 4 hours as needed only  if your can't catch your breath  . albuterol (PROVENTIL) (2.5 MG/3ML) 0.083% nebulizer solution Take 3 mLs (2.5 mg total) by nebulization every 4 (four) hours  as needed for wheezing or shortness of breath.  [name] Kitchen aspirin 81 MG tablet Take 81 mg by mouth daily.  . Budeson-Glycopyrrol-Formoterol (BREZTRI AEROSPHERE) 160-9-4.8 MCG/ACT AERO Inhale 2 puffs into the lungs 2 (two) times daily.  . pravastatin (PRAVACHOL) 40 MG tablet TAKE (1) TABLET BY MOUTH AT BEDTIME.  [name] Kitchen triamterene-hydrochlorothiazide (MAXZIDE-25) 37.5-25 MG tablet TAKE 1 & 1/2 TABLETS BY MOUTH ONCE DAILY.                 Past Medical History:  Diagnosis Date  . Asthma   . CAP (community acquired pneumonia) 12/10/2018  . COPD (chronic obstructive pulmonary disease) ([place])   . COPD with acute exacerbation ([place]) 04/18/2018  . CVA (cerebral vascular accident) ([place]) 2008   with temporary vision loss   . Depression   . ECHOCARDIOGRAM, ABNORMAL 03/06/2010   Qualifier: Diagnosis of  By: Purcell Nails, NP, Curt Bears    . Elevated PSA 2014   no diagnosis pf prostate cancer in 07/2014  . History of substance abuse ([place]) 01/14/2011   Marijuana  . History of substance abuse ([place]) 01/14/2011  . History of tobacco abuse 01/14/2011  . Hyperlipidemia   . Hypertension   . Lobar pneumonia ([place]) 12/13/2018  . Marijuana abuse   . Nicotine addiction   . Prediabetes 2014      Objective:    Elderly amb bm nad   07/28/2019        121  07/09/2019          122   06/10/2019  122   05/28/19 118 lb 8 oz (53.8 kg)  05/11/19 122 lb (55.3 kg)  04/16/19 122 lb 12.8 oz (55.7 kg)    Vital signs reviewed  07/28/2019  - Note at rest 02 sats  94% on RA     Report Edentulous   HEENT : pt wearing mask not removed for exam due to covid - 19 concerns.    NECK :  without JVD/Nodes/TM/ nl carotid upstrokes bilaterally   LUNGS: no acc muscle use,  Mild barrel  contour chest wall with bilateral  Distant bs s audible wheeze/ few  crackles in bases on insp and  without cough on insp or exp maneuvers  and mild  Hyperresonant  to  percussion bilaterally     CV:  RRR  no s3 or murmur or increase in P2, and no edema    ABD:  soft and nontender with pos end  insp Hoover's  in the supine position. No bruits or organomegaly appreciated, bowel sounds nl  MS:   Nl gait/  ext warm without deformities, calf tenderness, cyanosis or clubbing No obvious joint restrictions   SKIN: warm and dry without lesions    NEURO:  alert, approp, nl sensorium with  no motor or cerebellar deficits apparent.          CXR PA and Lateral:   07/28/2019 :    I personally reviewed images and  impression as follows:    copd/ no acute change/ no as dz           Assessment

## 2019-07-29 ENCOUNTER — Emergency Department (HOSPITAL_COMMUNITY)
Admission: EM | Admit: 2019-07-29 | Discharge: 2019-07-29 | Disposition: A | Discharge status: Home / Self Care | Payer: Medicare HMO | Attending: Emergency Medicine | Admitting: Emergency Medicine

## 2019-07-29 ENCOUNTER — Other Ambulatory Visit: Payer: Self-pay

## 2019-07-29 ENCOUNTER — Emergency Department (HOSPITAL_COMMUNITY): Payer: Medicare HMO

## 2019-07-29 ENCOUNTER — Encounter (HOSPITAL_COMMUNITY): Payer: Self-pay

## 2019-07-29 ENCOUNTER — Encounter: Payer: Self-pay | Admitting: Internal Medicine

## 2019-07-29 ENCOUNTER — Ambulatory Visit (HOSPITAL_COMMUNITY)
Admission: RE | Admit: 2019-07-29 | Discharge: 2019-07-29 | Disposition: A | Discharge status: Home / Self Care | Payer: Medicare HMO | Source: Ambulatory Visit | Attending: Internal Medicine | Admitting: Internal Medicine

## 2019-07-29 DIAGNOSIS — Z79899 Other long term (current) drug therapy: Secondary | ICD-10-CM | POA: Diagnosis not present

## 2019-07-29 DIAGNOSIS — Z7952 Long term (current) use of systemic steroids: Secondary | ICD-10-CM | POA: Insufficient documentation

## 2019-07-29 DIAGNOSIS — R0602 Shortness of breath: Secondary | ICD-10-CM | POA: Diagnosis present

## 2019-07-29 DIAGNOSIS — Z7951 Long term (current) use of inhaled steroids: Secondary | ICD-10-CM | POA: Insufficient documentation

## 2019-07-29 DIAGNOSIS — J449 Chronic obstructive pulmonary disease, unspecified: Secondary | ICD-10-CM | POA: Diagnosis not present

## 2019-07-29 DIAGNOSIS — R918 Other nonspecific abnormal finding of lung field: Secondary | ICD-10-CM

## 2019-07-29 DIAGNOSIS — Z87891 Personal history of nicotine dependence: Secondary | ICD-10-CM | POA: Insufficient documentation

## 2019-07-29 DIAGNOSIS — I10 Essential (primary) hypertension: Secondary | ICD-10-CM | POA: Insufficient documentation

## 2019-07-29 DIAGNOSIS — J441 Chronic obstructive pulmonary disease with (acute) exacerbation: Secondary | ICD-10-CM

## 2019-07-29 DIAGNOSIS — J439 Emphysema, unspecified: Secondary | ICD-10-CM | POA: Diagnosis not present

## 2019-07-29 LAB — CBC WITH DIFFERENTIAL/PLATELET
Abs Immature Granulocytes: 0.01 10*3/uL (ref 0.00–0.07)
Basophils Absolute: 0 10*3/uL (ref 0.0–0.1)
Basophils Relative: 1 %
Eosinophils Absolute: 0.2 10*3/uL (ref 0.0–0.5)
Eosinophils Relative: 4 %
HCT: 43.1 % (ref 39.0–52.0)
Hemoglobin: 13.6 g/dL (ref 13.0–17.0)
Immature Granulocytes: 0 %
Lymphocytes Relative: 20 %
Lymphs Abs: 1 10*3/uL (ref 0.7–4.0)
MCH: 30 pg (ref 26.0–34.0)
MCHC: 31.6 g/dL (ref 30.0–36.0)
MCV: 94.9 fL (ref 80.0–100.0)
Monocytes Absolute: 0.4 10*3/uL (ref 0.1–1.0)
Monocytes Relative: 9 %
Neutro Abs: 3.3 10*3/uL (ref 1.7–7.7)
Neutrophils Relative %: 66 %
Platelets: 190 10*3/uL (ref 150–400)
RBC: 4.54 MIL/uL (ref 4.22–5.81)
RDW: 13.6 % (ref 11.5–15.5)
WBC: 4.9 10*3/uL (ref 4.0–10.5)
nRBC: 0 % (ref 0.0–0.2)

## 2019-07-29 LAB — PULMONARY FUNCTION TEST
DL/VA % pred: 123 %
DL/VA: 5.03 ml/min/mmHg/L
DLCO unc % pred: 63 %
DLCO unc: 14.78 ml/min/mmHg
FEF 25-75 Post: 0.18 L/sec
FEF 25-75 Pre: 0.19 L/sec
FEF2575-%Change-Post: -6 %
FEF2575-%Pred-Post: 8 %
FEF2575-%Pred-Pre: 8 %
FEV1-%Change-Post: -8 %
FEV1-%Pred-Post: 22 %
FEV1-%Pred-Pre: 24 %
FEV1-Post: 0.57 L
FEV1-Pre: 0.63 L
FEV1FVC-%Change-Post: -9 %
FEV1FVC-%Pred-Pre: 42 %
FEV6-%Change-Post: -3 %
FEV6-%Pred-Post: 44 %
FEV6-%Pred-Pre: 46 %
FEV6-Post: 1.44 L
FEV6-Pre: 1.49 L
FEV6FVC-%Change-Post: -4 %
FEV6FVC-%Pred-Post: 77 %
FEV6FVC-%Pred-Pre: 81 %
FVC-%Change-Post: 1 %
FVC-%Pred-Post: 57 %
FVC-%Pred-Pre: 57 %
FVC-Post: 1.95 L
FVC-Pre: 1.93 L
Post FEV1/FVC ratio: 29 %
Post FEV6/FVC ratio: 74 %
Pre FEV1/FVC ratio: 32 %
Pre FEV6/FVC Ratio: 77 %
RV % pred: 321 %
RV: 7.41 L
TLC % pred: 143 %
TLC: 9.24 L

## 2019-07-29 LAB — BASIC METABOLIC PANEL
Anion gap: 9 (ref 5–15)
BUN: 13 mg/dL (ref 8–23)
CO2: 28 mmol/L (ref 22–32)
Calcium: 9.1 mg/dL (ref 8.9–10.3)
Chloride: 103 mmol/L (ref 98–111)
Creatinine, Ser: 0.76 mg/dL (ref 0.61–1.24)
GFR calc Af Amer: >60 mL/min (ref >60)
GFR calc non Af Amer: >60 mL/min (ref >60)
Glucose, Bld: 140 mg/dL — ABNORMAL HIGH (ref 70–99)
Potassium: 3.6 mmol/L (ref 3.5–5.1)
Sodium: 140 mmol/L (ref 135–145)

## 2019-07-29 MED ORDER — IPRATROPIUM-ALBUTEROL 0.5-2.5 (3) MG/3ML IN SOLN
3.0000 mL | Freq: Once | RESPIRATORY_TRACT | Status: AC
  Administered 2019-07-29: 3 mL via RESPIRATORY_TRACT
  Filled 2019-07-29: qty 3

## 2019-07-29 MED ORDER — PREDNISONE 20 MG PO TABS
40.0000 mg | ORAL_TABLET | Freq: Once | ORAL | Status: AC
  Administered 2019-07-29: 40 mg via ORAL
  Filled 2019-07-29: qty 2

## 2019-07-29 MED ORDER — ALBUTEROL SULFATE (2.5 MG/3ML) 0.083% IN NEBU
2.5000 mg | INHALATION_SOLUTION | Freq: Once | RESPIRATORY_TRACT | Status: AC
  Administered 2019-07-29: 2.5 mg via RESPIRATORY_TRACT

## 2019-07-29 MED ORDER — PREDNISONE 20 MG PO TABS
40.0000 mg | ORAL_TABLET | Freq: Every day | ORAL | 0 refills | Status: DC
Start: 2019-07-29 — End: 2019-08-03

## 2019-07-29 NOTE — ED Provider Notes (Signed)
[facility] EMERGENCY DEPARTMENT Provider Note   CSN: [id number] Arrival date & time: 07/29/19  1735     History Chief Complaint  Patient presents with  . Shortness of Breath    [name] is a 69 y.o. male.  HPI   69 year old male with dyspnea.  Acute on chronic.  History of COPD.  3 L of oxygen at baseline.  Began feeling more short of breath this afternoon.  Cough is stable.  No fevers or chills.  No acute pain.  No unusual leg pain or swelling.  Past Medical History:  Diagnosis Date  . Asthma   . CAP (community acquired pneumonia) 12/10/2018  . COPD (chronic obstructive pulmonary disease) ([place])   . COPD with acute exacerbation ([place]) 04/18/2018  . CVA (cerebral vascular accident) ([place]) 2008   with temporary vision loss   . Depression   . ECHOCARDIOGRAM, ABNORMAL 03/06/2010   Qualifier: Diagnosis of  By: Purcell Nails, NP, Curt Bears    . Elevated PSA 2014   no diagnosis pf prostate cancer in 07/2014  . History of substance abuse ([place]) 01/14/2011   Marijuana  . History of substance abuse ([place]) 01/14/2011  . History of tobacco abuse 01/14/2011  . Hyperlipidemia   . Hypertension   . Lobar pneumonia ([place]) 12/13/2018  . Marijuana abuse   . Nicotine addiction   . Prediabetes 2014    Patient Active Problem List   Diagnosis Date Noted  . Screening for diabetes mellitus 07/09/2019  . Rhinitis, chronic 06/10/2019  . Pulmonary infiltrates on CXR 05/29/2019  . COPD Group D  05/11/2019  . Low oxygen saturation 03/23/2019  . Food insecurity 11/22/2018  . Shortness of breath 04/18/2018  . Glucose intolerance 04/18/2018  . Encounter for examination following treatment at hospital 03/25/2015  . Social isolation 03/17/2013  . Asthma with COPD (chronic obstructive pulmonary disease) ([place] [place]) 11/12/2012  . Tachycardia 11/05/2010  . Vitamin D deficiency 06/28/2009  . IGT (impaired glucose tolerance) 01/25/2009  . PSA, INCREASED 01/25/2009  . Hyperlipidemia 07/30/2007  . Essential  hypertension 07/30/2007    History reviewed. No pertinent surgical history.     No family history on file.  Social History   Tobacco Use  . Smoking status: Former Smoker    Packs/day: 0.50    Years: 40.00    Pack years: 20.00    Types: Cigarettes    Quit date: 04/14/2015    Years since quitting: 4.2  . Smokeless tobacco: Never Used  Vaping Use  . Vaping Use: Never used  Substance Use Topics  . Alcohol use: Not Currently    Alcohol/week: 3.0 standard drinks    Types: 3 Cans of beer per week    Comment: occasionally  . Drug use: No    Comment: pt quit 20 years ago    Home Medications Prior to Admission medications   Medication Sig Start Date End Date Taking? Authorizing Provider  albuterol (PROAIR HFA) 108 (90 Base) MCG/ACT inhaler 2 puffs every 4 hours as needed only  if your can't catch your breath 06/10/19   Tanda Rockers, MD  albuterol (PROVENTIL) (2.5 MG/3ML) 0.083% nebulizer solution Take 3 mLs (2.5 mg total) by nebulization every 4 (four) hours as needed for wheezing or shortness of breath. 07/09/19   Tanda Rockers, MD  aspirin 81 MG tablet Take 81 mg by mouth daily.    [provider]  Budeson-Glycopyrrol-Formoterol (BREZTRI AEROSPHERE) 160-9-4.8 MCG/ACT AERO Inhale 2 puffs into the lungs 2 (two) times daily. 07/09/19  Tanda Rockers, MD  guaiFENesin (MUCINEX) 600 MG 12 hr tablet Take 600 mg by mouth 2 (two) times daily. Patient not taking: Reported on 07/28/2019    [provider]  pravastatin (PRAVACHOL) 40 MG tablet TAKE (1) TABLET BY MOUTH AT BEDTIME. 07/01/19   Fayrene Helper, MD  triamterene-hydrochlorothiazide (MAXZIDE-25) 37.5-25 MG tablet TAKE 1 & 1/2 TABLETS BY MOUTH ONCE DAILY. 07/01/19   Fayrene Helper, MD  simvastatin (ZOCOR) 40 MG tablet Take 40 mg by mouth daily. 10/02/10 03/18/11  Fayrene Helper, MD    Allergies    Patient has no known allergies.  Review of Systems   Review of Systems All systems reviewed and negative,  other than as noted in HPI.  Physical Exam Updated Vital Signs BP (!) 167/87 (BP Location: Right Arm)   Pulse (!) 115   Temp 98.2 F (36.8 C) (Oral)   Resp 18   Ht _0  (1.702 m)   Wt 55 kg   SpO2 99%   BMI 18.99 kg/m   Physical Exam Vitals and nursing note reviewed.  Constitutional:      General: He is not in acute distress.    Appearance: He is well-developed.  HENT:     Head: Normocephalic and atraumatic.  Eyes:     General:        Right eye: No discharge.        Left eye: No discharge.     Conjunctiva/sclera: Conjunctivae normal.  Cardiovascular:     Rate and Rhythm: Normal rate and regular rhythm.     Heart sounds: Normal heart sounds. No murmur heard.  No friction rub. No gallop.   Pulmonary:     Effort: Pulmonary effort is normal. No respiratory distress.     Breath sounds: Wheezing present.     Comments: Sitting up in bed.  No acute distress.  Can speak in complete sentences.  Some mild bilateral wheezing. Abdominal:     General: There is no distension.     Palpations: Abdomen is soft.     Tenderness: There is no abdominal tenderness.  Musculoskeletal:        General: No tenderness.     Cervical back: Neck supple.     Comments: Lower extremities symmetric as compared to each other. No calf tenderness. Negative Homan's. No palpable cords.   Skin:    General: Skin is warm and dry.  Neurological:     Mental Status: He is alert.  Psychiatric:        Behavior: Behavior normal.        Thought Content: Thought content normal.     ED Results / Procedures / Treatments   Labs (all labs ordered are listed, but only abnormal results are displayed) Labs Reviewed  BASIC METABOLIC PANEL - Abnormal; Notable for the following components:      Result Value   Glucose, Bld 140 (*)    All other components within normal limits  CBC WITH DIFFERENTIAL/PLATELET    EKG None  Radiology DG Chest 2 View  Result Date: 07/29/2019 CLINICAL DATA:  One year of shortness  of breath, COPD, asthma EXAM: CHEST - 2 VIEW COMPARISON:  CT 07/05/2019, subsequent radiograph 07/29/2019, prior radiograph 06/08/2019 FINDINGS: Chronic hyperinflation of the lungs similar to prior compatible with previously seen in facet minutes changes on comparison cross-sectional imaging. There are coarsened bronchitic and bronchiectatic features with a mid to lower lung predominance and scattered reticulonodular opacities similar to the distribution extent seen on  comparison CT. Some biapical pleuroparenchymal scarring and apical bullous disease is also unchanged from prior. No new consolidative opacity. No pneumothorax or effusion. No convincing features of edema. The aorta is calcified. The remaining cardiomediastinal contours are unremarkable and unchanged from prior. No acute osseous or soft tissue abnormality. IMPRESSION: 1. Stable bronchitic/bronchiectatic features with reticulonodular opacities. Findings are suggestive of atypical infection/potential atypical mycobacterial etiologies such as MAC. 2. Chronic hyperinflation/emphysematous changes. 3. No acute cardiopulmonary abnormality. Electronically Signed   By: Lovena Le M.D.   On: 07/29/2019 18:47   DG Chest Portable 1 View  Result Date: 07/29/2019 CLINICAL DATA:  Increasing shortness of breath, negative COVID-19 testing 3 days prior, history of asthma, COPD EXAM: PORTABLE CHEST 1 VIEW COMPARISON:  Radiograph 07/28/2019, CT 07/05/2019 FINDINGS: Chronic hyperinflation and relative apical lucency compatible with emphysematous changes. Redemonstration of the bronchial wall thickening and scattered reticulonodular opacities in the mid to lower lungs, similar in distribution and extent to the comparison CT in June. No new consolidative opacity, convincing features of edema. Visible pneumothorax or effusion. Stable cardiomediastinal contours. No acute osseous or soft tissue abnormality. Telemetry leads overlie the chest. IMPRESSION: Chronic bronchitic  changes and scattered reticulonodular opacities in the mid to lower lungs, similar in distribution and extent to the comparison CT, could suggest a chronic atypical infection including mycobacterial etiology such as MAC. Emphysematous changes as well. Electronically Signed   By: Lovena Le M.D.   On: 07/29/2019 18:44    Procedures Procedures (including critical care time)  Medications Ordered in ED Medications  ipratropium-albuterol (DUONEB) 0.5-2.5 (3) MG/3ML nebulizer solution 3 mL (3 mLs Nebulization Given 07/29/19 2025)    ED Course  I have reviewed the triage vital signs and the nursing notes.  Pertinent labs & imaging results that were available during my care of the patient were reviewed by me and considered in my medical decision making (see chart for details).    MDM Rules/Calculators/A&P                         69 year old male with what is clinically mild COPD exacerbation.  Will place on steroids.  Feels much better after nebs.  Return precautions discussed.  Outpatient follow-up otherwise.  Final Clinical Impression(s) / ED Diagnoses Final diagnoses:  COPD exacerbation ([place])    Rx / DC Orders ED Discharge Orders         Ordered    predniSONE (DELTASONE) 20 MG tablet  Daily     Discontinue  Reprint     07/29/19 2243           Virgel Manifold, MD 08/01/19 2303

## 2019-07-29 NOTE — Assessment & Plan Note (Addendum)
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 Constellation of findings is generally consistent with ongoing, worsened atypical infection, particularly atypical mycobacterium. There may be a component or history of aspiration. -07/09/2019  rec  levaquin 500 mg daily x 10 days   - cxr 07/28/2019 : improved vs 05/28/19   Hold further abx for now  Note: .This is an extremely common benign condition in the elderly and does not warrant aggressive eval/ rx at this point unless there is a clinical correlation suggesting unaddressed pulmonary infection (purulent sputum, night sweats, unintended wt loss, doe) or evolution of  obvious changes on plain cxr (as opposed to serial CT, which is way over sensitive to make clinical decisions re intervention and treatment in the elderly, who tend to tolerate both dx and treatment poorly) .   Medical decision making was a moderate level of complexity in this case because of  two chronic conditions /diagnoses requiring extra time for  H and P, chart review, counseling, teaching hfa and the difference between maint and prn  and generating customized AVS unique to this acute office visit and charting.   Each maintenance medication was reviewed in detail including emphasizing most importantly the difference between maintenance and prns and under what circumstances the prns are to be triggered using an action plan format where appropriate. Please see avs for details which were reviewed in writing by both me and my nurse and patient given a written copy highlighted where appropriate with yellow highlighter for the patient's  continued care at home along with an updated version of their medications.  Patient was asked to maintain medication reconciliation by comparing this list to the actual medications being used at home and to contact this office right away if there is a conflict or discrepancy.

## 2019-07-29 NOTE — ED Triage Notes (Signed)
Pt reports used his trilogy inhaler and became sob.  Says wears o2 at 3 liters prn.  Pt says pt his o2 on and his sob got worse.  Pt arrived to er with no o2 and o2 sat was 79% on room air.  Pt unable to sit back in the bed.  Put pt on o2 at 4liters and pt's sat 98% and pt says is more comfortable.

## 2019-07-30 NOTE — Progress Notes (Signed)
Spoke with pt and notified of results per Dr. Wert. Pt verbalized understanding and denied any questions. 

## 2019-08-03 ENCOUNTER — Other Ambulatory Visit: Payer: Self-pay

## 2019-08-03 ENCOUNTER — Encounter: Payer: Self-pay | Admitting: Internal Medicine

## 2019-08-03 ENCOUNTER — Ambulatory Visit (INDEPENDENT_AMBULATORY_CARE_PROVIDER_SITE_OTHER): Payer: Medicare HMO | Admitting: Internal Medicine

## 2019-08-03 DIAGNOSIS — J449 Chronic obstructive pulmonary disease, unspecified: Secondary | ICD-10-CM | POA: Diagnosis not present

## 2019-08-03 DIAGNOSIS — R918 Other nonspecific abnormal finding of lung field: Secondary | ICD-10-CM

## 2019-08-03 MED ORDER — BREZTRI AEROSPHERE 160-9-4.8 MCG/ACT IN AERO
2.0000 | INHALATION_SPRAY | Freq: Two times a day (BID) | RESPIRATORY_TRACT | 0 refills | Status: DC
Start: 2019-08-03 — End: 2019-08-04

## 2019-08-03 MED ORDER — BREZTRI AEROSPHERE 160-9-4.8 MCG/ACT IN AERO: INHALATION_SPRAY | RESPIRATORY_TRACT | 11 refills | Status: DC

## 2019-08-03 MED ORDER — PREDNISONE 10 MG PO TABS: ORAL_TABLET | ORAL | 0 refills | Status: DC

## 2019-08-03 NOTE — Assessment & Plan Note (Addendum)
Quit smoking 2017  - 05/11/2019  After extensive coaching inhaler device,  effectiveness =    90% elipta so rec change to trelegy  - 05/11/2019   Walked on RA   300 ft -@ avg pace stopped due to sob with sats at end  93%  - 05/28/2019  After extensive coaching inhaler device,  effectiveness =    75% changed to breztri trial x 2 week samples -  05/28/2019   Walked RA  approx   300 ft  @ moderately fast pace  stopped due to  Sob/ sats still 90%   - 07/28/2019  After extensive coaching inhaler device,  effectiveness =    75% (sort ti) >continue breztri 2bid PFT's  07/29/19  FEV1 0.63 (24 % ) ratio 0.32  p 0 % improvement from saba p ? prior to study with DLCO  14.78 (63%) corrects to 5.03 (123%)  for alv volume and FV curve classic curvature  - 08/03/2019  After extensive coaching inhaler device,  effectiveness =    75% > continue breztri and added pred maint 20 ceiling and 10 mg floor  -  08/03/2019   Walked RA  approx   300 ft  @ moderate pace  stopped due to  End of study no sob and sats 96% at end    Group D in terms of symptom/risk and laba/lama/ICS  therefore appropriate rx at this point >>>  Continue breztri with adequate technique noted.  Since continues to get a convincing response to prednisone rec lowest effective dose = 20 mg until better then 10 mg daily until seen   Advised:  formulary restrictions will be an ongoing challenge for the forseable future and I would be happy to pick an alternative if the pt will first  provide me a list of them -  pt  will need to return here for training for any new device that is required eg dpi vs hfa vs respimat.    In the meantime we can always provide samples so that the patient never runs out of any needed respiratory medications.

## 2019-08-03 NOTE — Progress Notes (Signed)
[name], male    DOB: 10/26/1950,  [name]: [id number]   Brief patient profile:  33 yobm quit smoking 2017/ likely illiterate   grew up with dx of asthma but only started needing in saba in HS when started smoking and eventually placed on advair 500 and followed by [name] and referred to pulmonary clinic in [facility]  05/11/2019 by [name] p [name] retired     History of Present Illness  05/11/2019  Pulmonary/ 1st [name] eval/[name] on advair 500  Chief Complaint  Patient presents with  . Pulmonary Consult    Referred by [name], NP. Former patient of [name]. He states his breathing has been worse since he had first covid vaccine 03/03/19. He gets SOB when he wakes up in the am and starts moving around. He has cough with cream colored sputum. He is using his albuterol inhaler about every 6 hours and duonebs 2 x daily.   Dyspnea:  Can't walk a block = MMRC3 = can't walk 100 yards even at a slow pace at a flat grade s stopping due to sob   Cough: min white mucus  Sleep: bed is flat 2 pillows  SABA use: way too much as above 02 :  Only uses 02 p sits down  rec Stop wixela, atrovent (ipatropium solution) and combivent  Plan A = Automatic = Always=    Trelegy one click each am - take two good drags Plan B = Backup (to supplement plan A, not to replace it) Only use your albuterol inhaler (PROAIR)  as a rescue medication   Plan C = Crisis (instead of Plan B but only if Plan B stops working) - only use your albuterol nebulizer if you first try Plan B and it fails to help > ok to use the nebulizer up to every 4 hours but if start needing it regularly call for immediate appointment  Prednisone 10 mg take  4 each am x 2 days,   2 each am x 2 days,  1 each am x 2 days and stop  Make sure you check your oxygen saturations at highest level of activity (not after you stop!) to be sure it stays over 90% and adjust upward to maintain this level if needed but remember to turn it back to  previous settings when you stop (to conserve your supply).     05/28/2019  Acute  ov/[name] re: copd / moderna 2nd shot 3/24  - totally confused with meds  Chief Complaint  Patient presents with  . Acute Visit    Pt states his breathing is not improving since the last visit 05/11/19- using proair 2 x daily until he just ran out. He also c/o cough with green sputum.   Dyspnea:  50 ft  - Pace Moderately  Fast from observation at ov  Cough: green x 2 months (said min white on 05/11/19 eval)   Sleeping: worse x 3 nights  SABA use: ran out of proair one day prior to ov/ very poor concept of maint vs prns and using trelegy sometimes twice daily  02: 02 prn but mostly uses p exertion/ does not monitor sats  rec Depomedrol 120 mg IM  Augmentin 875 mg take one pill twice daily  X 10 days - take at breakfast and supper with large glass of water. Change your inhalers: BREZTRI  AUTOMATICALLY =  take 2 puffs first thing in am and then another 2 puffs about 12 hours  later.  Only use your albuterol nebulizer  as a rescue medication   06/10/2019  f/u ov/[name] re: copd group D symptoms/ risk  Chief Complaint  Patient presents with  . Follow-up    Breathing has improved some but not back to normal baseline. He is coughing less but still has some minimal green sputum.   Dyspnea:  Some better - [employer] walking now  Cough: much better but still some am green mucus  Sleeping: able to lie flat  SABA use: using neb in am/ still has full breztri so not using it correctly  02: only uses prn  rec We will call to schedule sinus and chest CT prior to next [name] visit  Plan A = Automatic = Always=    trelegy one click each am first thing and out thru nose   [name] is the same medication but Take 2 puffs first thing in am and then another 2 puffs about 12 hours later and out thru the nose  Plan B = Backup (to supplement plan A, not to replace it) Only use your albuterol inhaler PROAIR)  as a rescue medication   Plan  C = Crisis (instead of Plan B but only if Plan B stops working) - only use your albuterol nebulizer if you first try Plan B and it fails to help > ok to use the nebulizer up to every 4 hours but if start needing it regularly call for immediate appointment Please schedule a follow up [name] visit in 4 weeks, sooner if needed  with all medications /inhalers/ solutions   07/09/2019  f/u ov/[name] re: severe copd clinically  Chief Complaint  Patient presents with  . Follow-up    Breathing had been doing better, but worse this am and he relates to rainy weather. He is using his albuterol inhaler 2 x per day and neb about 2 x per day.    Dyspnea: walking [employer] once  A week /  Cough: feels choked up / mucus green x months  Sleeping: able to sleep on 2 pillows / bed flat  SABA use: way too much   02: prn only  rec Plan A = Automatic = Always=    Stop trelegy and start Breztri Take 2 puffs first thing in am and then another 2 puffs about 12 hours later.  Work on inhaler technique:   Plan B = Backup (to supplement plan A, not to replace it) Only use your albuterol inhaler as a rescue medicatio  Plan C = Crisis (instead of Plan B but only if Plan B stops working) - only use your albuterol nebulizer if you first try Plan B and it fails to help > ok to use the nebulizer up to every 4 hours but if start needing it regularly call for immediate appointmentTry albuterol 15 min before (red inhaler one day, neb the next and nothing on the third day) an activity that you know would make you short of breath  Levaquin 500 mg daily x 10 days We will set you up for cxr and pfts in about 2 weeks same day and [name] and I will call you with results Please schedule a follow up [name] visit in 6 weeks, call sooner if needed    07/28/2019  Acute  ov/[name]/[name] re: copd group D /illiterate Chief Complaint  Patient presents with  . Acute Visit    [name] due to him not feeling well for the last  day. He has  been breathing harder than normal and that "his chest is stopping up" denies cough   Dyspnea:  Worse since mowed grass in rain 7/17 assoc sense of chest congestion and tightness  Cough: better since levaquin completed Sleeping: ok  SABA use: baseline bid hfa and nebulizer "once a day" - no insight at all into prn rx  02: only uses prn/ does not check sats    rec Plan A = Automatic = Always=    Breztri Take 2 puffs first thing in am and then another 2 puffs about 12 hours later.  Work on inhaler technique: Plan B = Backup (to supplement plan A, not to replace it) Only use your albuterol inhaler as a rescue medication Plan C = Crisis (instead of Plan B but only if Plan B stops working) - only use your albuterol nebulizer if you first try Plan B  keep appt for your PFTs at 9 am tomorrowPlease remember to go to the  x-ray department  for your tests - we will call you with the results when they are available  We need to see you at the end of next week with your inhalers (all of them) and your nebulizer solution - one sample of breztri given, says has two more at home (does not compute) as has not purchased any yet so his samples should have run out    08/03/2019  f/u ov/[name] re: GOLD IV  breztri Chief Complaint  Patient presents with  . Follow-up    no complaints  Dyspnea:  Better p prednisone from ER p went for pfts was more sob p saba   Cough: none  Sleeping: lie flat no 02  SABA use: mixed up between neb and hfa but only uses avg twice daily, does know now to use up to every 4 h prn  02: prn / not checking sats    No obvious day to day or daytime variability or assoc excess/ purulent sputum or mucus plugs or hemoptysis or cp or chest tightness, subjective wheeze or overt sinus or hb symptoms.   Sleeping  without nocturnal  or early am exacerbation  of respiratory  c/o's or need for noct saba. Also denies any obvious fluctuation of symptoms with weather or environmental  changes or other aggravating or alleviating factors except as outlined above   No unusual exposure hx or h/o childhood pna or knowledge of premature birth.  Current Allergies, Complete Past Medical History, Past Surgical History, Family History, and Social History were reviewed in [employer] record.  ROS  The following are not active complaints unless bolded Hoarseness, sore throat, dysphagia, dental problems, itching, sneezing,  nasal congestion or discharge of excess mucus or purulent secretions, ear ache,   fever, chills, sweats, unintended wt loss or wt gain, classically pleuritic or exertional cp,  orthopnea pnd or arm/hand swelling  or leg swelling, presyncope, palpitations, abdominal pain, anorexia, nausea, vomiting, diarrhea  or change in bowel habits or change in bladder habits, change in stools or change in urine, dysuria, hematuria,  rash, arthralgias, visual complaints, headache, numbness, weakness or ataxia or problems with walking or coordination,  change in mood or  memory.        Current Meds  Medication Sig  . albuterol (PROAIR HFA) 108 (90 Base) MCG/ACT inhaler 2 puffs every 4 hours as needed only  if your can't catch your breath  . albuterol (PROVENTIL) (2.5 MG/3ML) 0.083% nebulizer solution Take 3 mLs (2.5 mg total) by nebulization  every 4 (four) hours as needed for wheezing or shortness of breath.  [name] Kitchen aspirin 81 MG tablet Take 81 mg by mouth daily.  . Budeson-Glycopyrrol-Formoterol (BREZTRI AEROSPHERE) 160-9-4.8 MCG/ACT AERO Inhale 2 puffs into the lungs 2 (two) times daily.  [name] Kitchen guaiFENesin (MUCINEX) 600 MG 12 hr tablet Take 600 mg by mouth 2 (two) times daily.   . pravastatin (PRAVACHOL) 40 MG tablet TAKE (1) TABLET BY MOUTH AT BEDTIME.  [name] Kitchen triamterene-hydrochlorothiazide (MAXZIDE-25) 37.5-25 MG tablet TAKE 1 & 1/2 TABLETS BY MOUTH ONCE DAILY.               Past Medical History:  Diagnosis Date  . Asthma   . CAP (community acquired pneumonia)  12/10/2018  . COPD (chronic obstructive pulmonary disease) ([place])   . COPD with acute exacerbation ([place]) 04/18/2018  . CVA (cerebral vascular accident) ([place]) 2008   with temporary vision loss   . Depression   . ECHOCARDIOGRAM, ABNORMAL 03/06/2010   Qualifier: Diagnosis of  By: Purcell Nails, NP, Curt Bears    . Elevated PSA 2014   no diagnosis pf prostate cancer in 07/2014  . History of substance abuse ([place]) 01/14/2011   Marijuana  . History of substance abuse ([place]) 01/14/2011  . History of tobacco abuse 01/14/2011  . Hyperlipidemia   . Hypertension   . Lobar pneumonia ([place]) 12/13/2018  . Marijuana abuse   . Nicotine addiction   . Prediabetes 2014      Objective:     elderly thin bm and   08/03/2019        122  07/28/2019        121  07/09/2019          122   06/10/2019         122   05/28/19 118 lb 8 oz (53.8 kg)  05/11/19 122 lb (55.3 kg)  04/16/19 122 lb 12.8 oz (55.7 kg)      Vital signs reviewed  08/03/2019  - Note at rest 02 sats  95% on RA      Report Edentulous  HEENT : pt wearing mask not removed for exam due to covid -19 concerns.    NECK :  without JVD/Nodes/TM/ nl carotid upstrokes bilaterally   LUNGS: no acc muscle use,  Mod barrel  contour chest wall with bilateral  Distant bs s audible wheeze and  without cough on insp or exp maneuvers and mod  Hyperresonant  to  percussion bilaterally     CV:  RRR  no s3 or murmur or increase in P2, and no edema   ABD:  soft and nontender with pos mid insp Hoover's  in the supine position. No bruits or organomegaly appreciated, bowel sounds nl  MS:     ext warm without deformities, calf tenderness, cyanosis or clubbing No obvious joint restrictions   SKIN: warm and dry without lesions    NEURO:  alert, approp, nl sensorium with  no motor or cerebellar deficits apparent.            I personally reviewed images and agree with radiology impression as follows:  CXR:   Portable  07/29/19 Chronic bronchitic changes and scattered  reticulonodular opacities in the mid to lower lungs, similar in distribution and extent to the comparison CT, could suggest a chronic atypical infection including mycobacterial etiology such as MAC.  Emphysematous changes as well. My review:  Severe copd changes with only very mild  non-speficific increase in lung markings     Assessment

## 2019-08-03 NOTE — Patient Instructions (Addendum)
No change on your inhalers   Prednisone 10 mg 2 daily when doing poorly, one a day thereafter   Please schedule a follow up office visit in 4-6  weeks, sooner if needed - bring all inhalers/ solutiion

## 2019-08-03 NOTE — Assessment & Plan Note (Addendum)
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 Constellation of findings is generally consistent with ongoing, worsened atypical infection, particularly atypical mycobacterium. There may be a component or history of aspiration. -07/09/2019  rec  levaquin 500 mg daily x 10 days   - cxr 07/28/2019 : improved vs 05/28/19   No cough or convincing evolving infiltrates typical of low grade MAI. This is an extremely common benign condition in the elderly and does not warrant aggressive eval/ rx at this point unless there is a clinical correlation suggesting unaddressed pulmonary infection (purulent sputum, night sweats, unintended wt loss, doe) or evolution of  obvious changes on plain cxr (as opposed to serial CT, which is way over sensitive to make clinical decisions re intervention and treatment in the elderly, who tend to tolerate both dx and treatment poorly) .   Discussed in detail all the  indications, usual  risks and alternatives  relative to the benefits with patient who agrees to proceed with conservative f/u as outlined           Each maintenance medication was reviewed in detail including emphasizing most importantly the difference between maintenance and prns and under what circumstances the prns are to be triggered using an action plan format where appropriate.  Total time for H and P, chart review, counseling, teaching device/  directly observing portions of ambulatory 02 saturation study/  and generating customized AVS unique to this office visit / charting = 30 min

## 2019-08-04 ENCOUNTER — Ambulatory Visit (INDEPENDENT_AMBULATORY_CARE_PROVIDER_SITE_OTHER): Payer: Medicare HMO | Admitting: Family Medicine

## 2019-08-04 ENCOUNTER — Encounter: Payer: Self-pay | Admitting: Family Medicine

## 2019-08-04 ENCOUNTER — Other Ambulatory Visit: Payer: Self-pay

## 2019-08-04 VITALS — BP 138/78 | HR 64 | Temp 97.4°F | Resp 20 | Ht 67.0 in | Wt 124.0 lb

## 2019-08-04 DIAGNOSIS — J449 Chronic obstructive pulmonary disease, unspecified: Secondary | ICD-10-CM | POA: Diagnosis not present

## 2019-08-04 DIAGNOSIS — Z09 Encounter for follow-up examination after completed treatment for conditions other than malignant neoplasm: Secondary | ICD-10-CM | POA: Diagnosis not present

## 2019-08-04 NOTE — Progress Notes (Signed)
Subjective:  Patient ID: [name], male    DOB: September 25, 1950  Age: 69 y.o. [name]: [id number]  CC:  Chief Complaint  Patient presents with  . Follow-up    ER follow up was there for SOB has been better since being home       HPI  HPI   [name] is a 69 year old male patient Dr. Griffin Dakin.  He presents today for follow-up post emergency room treatment.  He had dyspnea on 7/22.  Acute on chronic COPD exacerbation.  3 L of oxygen at baseline.  He began feeling more short of breath that afternoon.  Cough was stable no increase in sputum.  No fevers or chills.  No acute pain.  On PE he was noted to have wheezing.  Sitting up in the bed.  No acute distress.  Was able to complete sentences fully.  Some mild bilateral wheezing.  He was treated for a mild COPD exacerbation.  Placed on steroids.  And advised to follow-up outpatient.  He followed up with pulmonology yesterday on July 27.  He was better with the prednisone.  He was advised to take 20 mg and then the back down to 10 mg.  He questioned how he was post to take medication and then understood after explained in the office.  Today he denies any excessive or purulent sputum, hemoptysis, chest pain or chest tightness, wheezing or shortness of breath.  He does report that he is doing much better on the [facility] inhaler.  He brought in all his medications to make sure that he was taking it correctly and for review.  Of note in review he has been able to put on a couple pounds in the last month and a half.  Keeping him in the normal weight range.  Today patient denies signs and symptoms of COVID 19 infection including fever, chills, cough, shortness of breath, and headache. Past Medical, Surgical, Social History, Allergies, and Medications have been Reviewed.   Past Medical History:  Diagnosis Date  . Asthma   . CAP (community acquired pneumonia) 12/10/2018  . COPD (chronic obstructive pulmonary disease) ([place])   . COPD with  acute exacerbation ([place]) 04/18/2018  . CVA (cerebral vascular accident) ([place]) 2008   with temporary vision loss   . Depression   . ECHOCARDIOGRAM, ABNORMAL 03/06/2010   Qualifier: Diagnosis of  By: Purcell Nails, NP, Curt Bears    . Elevated PSA 2014   no diagnosis pf prostate cancer in 07/2014  . History of substance abuse ([place]) 01/14/2011   Marijuana  . History of substance abuse ([place]) 01/14/2011  . History of tobacco abuse 01/14/2011  . Hyperlipidemia   . Hypertension   . Lobar pneumonia ([place]) 12/13/2018  . Marijuana abuse   . Nicotine addiction   . Prediabetes 2014    Current Meds  Medication Sig  . albuterol (PROAIR HFA) 108 (90 Base) MCG/ACT inhaler 2 puffs every 4 hours as needed only  if your can't catch your breath  . albuterol (PROVENTIL) (2.5 MG/3ML) 0.083% nebulizer solution Take 3 mLs (2.5 mg total) by nebulization every 4 (four) hours as needed for wheezing or shortness of breath.  [name] Kitchen aspirin 81 MG tablet Take 81 mg by mouth daily.  . Budeson-Glycopyrrol-Formoterol (BREZTRI AEROSPHERE) 160-9-4.8 MCG/ACT AERO Take 2 puffs first thing in am and then another 2 puffs about 12 hours later.  [name] Kitchen guaiFENesin (MUCINEX) 600 MG 12 hr tablet Take 600 mg by mouth 2 (two) times daily.   [name] Kitchen  pravastatin (PRAVACHOL) 40 MG tablet TAKE (1) TABLET BY MOUTH AT BEDTIME.  . predniSONE (DELTASONE) 10 MG tablet 2 daily until better then 1 daily  . triamterene-hydrochlorothiazide (MAXZIDE-25) 37.5-25 MG tablet TAKE 1 & 1/2 TABLETS BY MOUTH ONCE DAILY.    ROS:  Review of Systems  Constitutional: Negative.   HENT: Negative.   Eyes: Negative.   Respiratory: Negative.   Cardiovascular: Negative.   Gastrointestinal: Negative.   Genitourinary: Negative.   Musculoskeletal: Negative.   Skin: Negative.   Neurological: Negative.   Endo/Heme/Allergies: Negative.   Psychiatric/Behavioral: Negative.   All other systems reviewed and are negative.    Objective:   Today's Vitals: BP (!) 138/78 (BP Location:  Right Arm, Patient Position: Sitting, Cuff Size: Normal)   Pulse 64   Temp (!) 97.4 F (36.3 C) (Temporal)   Resp 20   Ht 5\' 7"  (1.702 m)   Wt 124 lb (56.2 kg)   SpO2 95%   BMI 19.42 kg/m  Vitals with BMI 08/04/2019 08/03/2019 07/29/2019  Height 5\' 7"  5\' 7"  -  Weight 124 lbs 122 lbs 13 oz -  BMI 02.40 97.35 -  Systolic 329 924 268  Diastolic 78 80 90  Pulse 64 90 85     Physical Exam Vitals and nursing note reviewed.  Constitutional:      Appearance: Normal appearance. He is well-developed and well-groomed. He is obese.  HENT:     Head: Normocephalic and atraumatic.     Right Ear: External ear normal.     Left Ear: External ear normal.     Mouth/Throat:     Comments: Mask in place Eyes:     General:        Right eye: No discharge.        Left eye: No discharge.     Conjunctiva/sclera: Conjunctivae normal.  Cardiovascular:     Rate and Rhythm: Normal rate and regular rhythm.     Pulses: Normal pulses.     Heart sounds: Normal heart sounds.  Pulmonary:     Effort: Pulmonary effort is normal.     Breath sounds: No decreased air movement. Examination of the left-middle field reveals wheezing. Wheezing present. No decreased breath sounds.  Musculoskeletal:        General: Normal range of motion.     Cervical back: Normal range of motion and neck supple.  Skin:    General: Skin is warm.  Neurological:     General: No focal deficit present.     Mental Status: He is alert and oriented to person, place, and time.  Psychiatric:        Attention and Perception: Attention normal.        Mood and Affect: Mood normal.        Speech: Speech normal.        Behavior: Behavior normal. Behavior is cooperative.        Thought Content: Thought content normal.        Cognition and Memory: Cognition normal.        Judgment: Judgment normal.     Assessment   1. Encounter for examination following treatment at hospital   2. COPD GOLD IV/ group D      Tests ordered No orders of  the defined types were placed in this encounter.    Plan: Please see assessment and plan per problem list above.   No orders of the defined types were placed in this encounter.   Patient to follow-up in  12/01/2019 .  Perlie Mayo, NP

## 2019-08-04 NOTE — Patient Instructions (Addendum)
I appreciate the opportunity to provide you with care for your health and wellness. Today we discussed: recent ER visit  Follow up: as scheduled  No labs or referrals today  Your appt in Dec- please do not eat breakfast- we will get labs that day.  Take your prednisone 10 mg daily. If you start feeling short of breath or increased coughing, take 20 mg (2 tablets) and call Dr Gustavus Bryant office.  Glad to see you are doing well overall :)  Happy you are breathing better!  Please continue to practice social distancing to keep you, your family, and our community safe.  If you must go out, please wear a mask and practice good handwashing.  It was a pleasure to see you and I look forward to continuing to work together on your health and well-being. Please do not hesitate to call the office if you need care or have questions about your care.  Have a wonderful day and week. With Gratitude, [name], DNP, AGNP-BC

## 2019-08-04 NOTE — Assessment & Plan Note (Signed)
See note for review of emergency room notes, test and labs.  No new orders at this time.  Close follow-up with pulmonology.  Continues to have a high risk of return but this seems to have decreased some since seeing pulmonology.  Continue all medications as directed.  Extensive reviewing use of prednisone and inhalers today in the office.

## 2019-08-04 NOTE — Assessment & Plan Note (Signed)
Doing much better on 10 mg of prednisone.  And the use of breztri inhaler.  Is followed closely now by pulmonology very appreciative of this.  Continue all medications as directed per pulmonology.

## 2019-08-05 NOTE — Patient Outreach (Signed)
[facility]) Care Management  08/05/2019  [name] 1950-07-31 [id number]   Late entry for 07/09/19 [employer] outreach to complex care patient   [name] was referred to [facility] on 03/24/19 from Dr Bari Mantis office staff Nelva Bush for Social work consult -pt with severe COPD has no family and lives alone needs to look for assisted living facility but has no transport or anyone to help with process   Insurance:[employer]are 03/05/19 & 03/11/19 [place] ED visit forshortness of breath (sob),COPD exacerbation  [name] has received [facility] from [facility] RN CMs and SWs yearly since 2016. Last services received in December 2020    Patient is able to verify HIPAA, DOB and Address Reviewed and addressed  reason for follow up/care coordination call to [facility] patient- f/u of pulmonary appointment on 07/09/19    Follow up [name] confirms he attended his scheduled pulmonary appointment  He reports being given medication for infection. He reports he was noted green sputum. [facility] RN CM reviewed the pulmonology treatment plan. [facility] RN CM encourage [name] to take the ordered medication, answered questions about how to take his albuterol  Portable oxygen [name] reports need of light weight portable oxygen he will be able to roll around. He confirms he now has a concentrator with heavy tanks  [facility] RN CM discussed [employer] ([phone]) standard oxygen supplies [facility] RN CM discussed the out of pocket cost of In[employer] and he confirms he would not be able to afford an out of pocket expense  He reports he has a rug in his home that he has not been able to vacuum as he does not own a good vacuum  His is broken He uses his moped for transportation and is not generally able to carry large items on his moped He continues to [place] his own lawn  A male was ask to [place] it for $40 but never showed He reports he could better afford to pay someone $20 vs $40  [facility] RN CM discussed him  getting connected with the [place] senior center for possible resources  He is familiar with [facility] and does not prefer the services as he had an experience in which he was left at a MD office by [facility] and had to pay a taxi to return him home     Social: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 His son recently passed and was buried per pt on 03/24/19. He has no siblings and reports his wife's family "has nothing to do with me since I don't drink or smoke any more" He uses a moped for transportation to go out to medical appointments, to complete errands and go grocery shopping. He denies food insecurity. He reports a male "from the hospital" has assisted but only on occasions. He is not certain of her name ? [name]  He reports being independent to assist with all care needs related to severeshortness of breath (sob)with any activity. He reports not taking showers, sweeping or cleaning his home related to "becoming short winded".  He reports an eleventh grade education He confirms difficulty in reading and generally has others to assist with reading. He reports he can sign his name.   He informs [facility] RN CM he has been depressed since the death of his mother when he was age 32 and his father was not active  in his care. He denies concerns with his appetite, loss of interest in doing things or wanting to harm himself or others. He reports he has to stay strong for himself.He is not on medicine for depression. Social isolation per assessment PHQ 2 =1 today  He reports some sleeping concerns   Conditions:asthma,Chronic obstructive pulmonary disease (COPD),cerebrovascular accident (CVA),Hypertension (HTN),history (hx)ofpneumonia (PNA), depression, hx of substance abuse (marijuana), hx of tobacco abuse,Hyperlipidemia (HLD), prediabetes (last Hg A1c 6.4 on 12/13/18- He denies diagnosis of diabetes)  Fall Denies,  None  ZOX:[id number] doesnotwear (had about 5-6 years- needs new ones), nebulizer 3 L O2 at home  Appointments 08/20/19 Dr wert pulmonology   Plans [facility] RN CM will follow up with [name] within the next 28-35 business days  [facility] CM Care Plan Problem One     Most Recent Value  Care Plan Problem One lower level of care assistance (assisted living per MD referral)  Role Documenting the Problem One Care Management Telephonic Coordinator  Care Plan for Problem One Active  [name] Long Term Goal  over the next 31 days patient will work with [facility] Social worker to receive assistance with lower level of care placement as evidence by EMR notes  [facility] Long Term Goal Start Date 03/25/19  [facility] Long Term Goal Met Date 06/24/19  [facility] CM Short Term Goal #1  over the next 14 days THN SW will discuss available options as evidence by EMR notes  [facility]N CM Short Term Goal #1 Start Date 03/25/19  [facility] CM Short Term Goal #1 Met Date 06/24/19    [facility] CM Care Plan Problem Two     Most Recent Value  Care Plan Problem Two knowledge deficits of COPD home care and DME needs for COPD  Role Documenting the Problem Two Care Management Telephonic Coordinator  Care Plan for Problem Two Active  Interventions for Problem Two Long Term Goal  further assessment discussed inogen out of pocket expense, follow up pulmonology appt, assessed home management r/t rug   THN Long Term Goal over the next 31 days patient will be able to remain without admission with improved home care of COPD as evidence by EMR notes  [facility]N Long Term Goal Start Date 06/24/19  [facility] CM Short Term Goal #1  over the next 45 days patient will receive home oxygen per EMR and verbalization of the patient during future outreach  [facility] CM Short Term Goal #1 Start Date 03/24/19  Interventions for Short Term Goal #2  answered questions about portable tanks, inogen out of pocket expense encouraged continous use of present tanks       Braidon Chermak L. Lavina Hamman, RN, BSN, [place] Coordinator Office number [phone] Mobile number [phone]  Main [facility] number [phone] Fax number [phone]

## 2019-08-06 ENCOUNTER — Other Ambulatory Visit: Payer: Self-pay | Admitting: *Deleted

## 2019-08-06 NOTE — Patient Outreach (Signed)
[name]) Care Management  08/06/2019  [name] 11/13/1950 [id number]   [facility] outreach to complex care patient   Ian [name] was referred to [facility] on 03/24/19 from Dr Bari Mantis office staff Nelva Bush for Social work consult -pt with severe COPD has no family and lives alone needs to look for assisted living facility but has no transport or anyone to help with process   Insurance:[employer] 03/05/19 & 03/11/19 07/29/19 [place] ED visit forshortness of breath (sob),COPD exacerbation  Ian [name] has received [facility] services from [facility] RN CMs and SWs yearly since 2016. Last services received in December 2020    Patient is able to verify HIPAA, DOB and Address Reviewed and addressed reason for follow up/care coordination call to [facility] patient- f/u of pulmonary appointment on 07/09/19    Follow up Ian Hardcastle reports he is doing better with his home management of COPD   He continues to discuss not being able to get others to assist him even when offering to pay them.  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 His son recently passed and was buried per pt on 03/24/19. He has no siblings and reports his wife's family "has nothing to do with me since I don't drink or smoke any more" He uses a moped for transportation to go out to medical appointments, to complete errands and go grocery shopping. He denies food insecurity. He reports a male "from the hospital" has assisted but only on occasions. He is not certain of her name ? [name]  He reports being independent to assist with all care needs related to  severeshortness of breath (sob)with any activity. He reports not taking showers, sweeping or cleaning his home related to "becoming short winded".  He reports an eleventh grade education He confirms difficulty in reading and generally has others to assist with reading. He reports he can sign his name.   He informs [name] RN CM he has been depressed since the death of his mother when he was age 69 and his father was not active in his care. He denies concerns with his appetite, loss of interest in doing things or wanting to harm himself or others. He reports he has to stay strong for himself.He is not on medicine for depression. Social isolation per assessment PHQ 2 =1 today  He reports some sleeping concerns   Conditions:asthma,Chronic obstructive pulmonary disease (COPD),cerebrovascular accident (CVA),Hypertension (HTN),history (hx)ofpneumonia (PNA), depression, hx of substance abuse (marijuana), hx of tobacco abuse,Hyperlipidemia (HLD), prediabetes (last Hg A1c 6.4 on 12/13/18- He denies diagnosis of diabetes)  Fall Denies, None  BOF:BPZWCHENID doesnotwear (had about 5-6 years- needs new ones), nebulizer 3 L O2 at home  Appointments 08/20/19 Dr wert pulmonology  12/01/19 [name] NP at pcp office  12/10/19 [name] NP at pcp office  Plans [facility] RN CM will follow up with Ian Dinovo within the next 28-35 business days  [name], RN, BSN, [place] Coordinator Office number [phone] Mobile number [phone]  Main [facility] number [phone] Fax  number [phone]

## 2019-08-20 ENCOUNTER — Other Ambulatory Visit: Payer: Self-pay

## 2019-08-20 ENCOUNTER — Encounter: Payer: Self-pay | Admitting: Internal Medicine

## 2019-08-20 ENCOUNTER — Ambulatory Visit: Payer: Medicare HMO | Admitting: Internal Medicine

## 2019-08-20 DIAGNOSIS — J449 Chronic obstructive pulmonary disease, unspecified: Secondary | ICD-10-CM | POA: Diagnosis not present

## 2019-08-20 MED ORDER — BREZTRI AEROSPHERE 160-9-4.8 MCG/ACT IN AERO: 2.0000 | INHALATION_SPRAY | Freq: Two times a day (BID) | RESPIRATORY_TRACT | 0 refills | Status: DC

## 2019-08-20 NOTE — Patient Instructions (Signed)
No change in medications    Please schedule a follow up office visit in 6- 8 weeks, call sooner if needed

## 2019-08-20 NOTE — Progress Notes (Signed)
[name], male    DOB: 15-Mar-1950,  [name]: [id number]   Brief patient profile:  26 yobm quit smoking 2017/ likely illiterate   grew up with dx of asthma but only started needing in saba in HS when started smoking and eventually placed on advair 500 and followed by [name] and referred to pulmonary clinic in [facility]  05/11/2019 by [name] p [name] retired     History of Present Illness  05/11/2019  Pulmonary/ 1st office eval/L[place]as [name] on advair 500  Chief Complaint  Patient presents with   Pulmonary Consult    Referred by Ian Beach, NP. Former patient of [name]. He states his breathing has been worse since he had first covid vaccine 03/03/19. He gets SOB when he wakes up in the am and starts moving around. He has cough with cream colored sputum. He is using his albuterol inhaler about every 6 hours and duonebs 2 x daily.   Dyspnea:  Can't walk a block = MMRC3 = can't walk 100 yards even at a slow pace at a flat grade s stopping due to sob   Cough: min white mucus  Sleep: bed is flat 2 pillows  SABA use: way too much as above 02 :  Only uses 02 p sits down  rec Stop wixela, atrovent (ipatropium solution) and combivent  Plan A = Automatic = Always=    Trelegy one click each am - take two good drags Plan B = Backup (to supplement plan A, not to replace it) Only use your albuterol inhaler (PROAIR)  as a rescue medication   Plan C = Crisis (instead of Plan B but only if Plan B stops working) - only use your albuterol nebulizer if you first try Plan B and it fails to help > ok to use the nebulizer up to every 4 hours but if start needing it regularly call for immediate appointment  Prednisone 10 mg take  4 each am x 2 days,   2 each am x 2 days,  1 each am x 2 days and stop  Make sure you check your oxygen saturations at highest level of activity (not after you stop!) to be sure it stays over 90% and adjust upward to maintain this level if needed but remember to turn it back to  previous settings when you stop (to conserve your supply).     05/28/2019  Acute  ov/Lakira Ogando re: copd / moderna 2nd shot 3/24  - totally confused with meds  Chief Complaint  Patient presents with   Acute Visit    Pt states his breathing is not improving since the last visit 05/11/19- using proair 2 x daily until he just ran out. He also c/o cough with green sputum.   Dyspnea:  50 ft  - Pace Moderately  Fast from observation at ov  Cough: green x 2 months (said min white on 05/11/19 eval)   Sleeping: worse x 3 nights  SABA use: ran out of proair one day prior to ov/ very poor concept of maint vs prns and using trelegy sometimes twice daily  02: 02 prn but mostly uses p exertion/ does not monitor sats  rec Depomedrol 120 mg IM  Augmentin 875 mg take one pill twice daily  X 10 days - take at breakfast and supper with large glass of water. Change your inhalers: BREZTRI  AUTOMATICALLY =  take 2 puffs first thing in am and then another 2 puffs about 12 hours  later.  Only use your albuterol nebulizer  as a rescue medication   06/10/2019  f/u ov/[name] re: copd group D symptoms/ risk  Chief Complaint  Patient presents with   Follow-up    Breathing has improved some but not back to normal baseline. He is coughing less but still has some minimal green sputum.   Dyspnea:  Some better - [employer] walking now  Cough: much better but still some am green mucus  Sleeping: able to lie flat  SABA use: using neb in am/ still has full breztri so not using it correctly  02: only uses prn  rec We will call to schedule sinus and chest CT prior to next office visit  Plan A = Automatic = Always=    trelegy one click each am first thing and out thru nose   [name] is the same medication but Take 2 puffs first thing in am and then another 2 puffs about 12 hours later and out thru the nose  Plan B = Backup (to supplement plan A, not to replace it) Only use your albuterol inhaler PROAIR)  as a rescue medication   Plan  C = Crisis (instead of Plan B but only if Plan B stops working) - only use your albuterol nebulizer if you first try Plan B and it fails to help > ok to use the nebulizer up to every 4 hours but if start needing it regularly call for immediate appointment Please schedule a follow up office visit in 4 weeks, sooner if needed  with all medications /inhalers/ solutions   07/09/2019  f/u ov/[name] re: severe copd clinically  Chief Complaint  Patient presents with   Follow-up    Breathing had been doing better, but worse this am and he relates to rainy weather. He is using his albuterol inhaler 2 x per day and neb about 2 x per day.    Dyspnea: walking [employer] once  A week /  Cough: feels choked up / mucus green x months  Sleeping: able to sleep on 2 pillows / bed flat  SABA use: way too much   02: prn only  rec Plan A = Automatic = Always=    Stop trelegy and start Breztri Take 2 puffs first thing in am and then another 2 puffs about 12 hours later.  Work on inhaler technique:   Plan B = Backup (to supplement plan A, not to replace it) Only use your albuterol inhaler as a rescue medicatio  Plan C = Crisis (instead of Plan B but only if Plan B stops working) - only use your albuterol nebulizer if you first try Plan B and it fails to help > ok to use the nebulizer up to every 4 hours but if start needing it regularly call for immediate appointmentTry albuterol 15 min before (red inhaler one day, neb the next and nothing on the third day) an activity that you know would make you short of breath  Levaquin 500 mg daily x 10 days We will set you up for cxr and pfts in about 2 weeks same day and [name] and I will call you with results Please schedule a follow up office visit in 6 weeks, call sooner if needed    07/28/2019  Acute  ov/[name] office/[name] re: copd group D /illiterate Chief Complaint  Patient presents with   Acute Visit    [name] due to him not feeling well for the last  day. He has  been breathing harder than normal and that "his chest is stopping up" denies cough   Dyspnea:  Worse since mowed grass in rain 7/17 assoc sense of chest congestion and tightness  Cough: better since levaquin completed Sleeping: ok  SABA use: baseline bid hfa and nebulizer "once a day" - no insight at all into prn rx  02: only uses prn/ does not check sats    rec Plan A = Automatic = Always=    Breztri Take 2 puffs first thing in am and then another 2 puffs about 12 hours later.  Work on inhaler technique: Plan B = Backup (to supplement plan A, not to replace it) Only use your albuterol inhaler as a rescue medication Plan C = Crisis (instead of Plan B but only if Plan B stops working) - only use your albuterol nebulizer if you first try Plan B  keep appt for your PFTs at 9 am tomorrowPlease remember to go to the  x-ray department  for your tests - we will call you with the results when they are available  We need to see you at the end of next week with your inhalers (all of them) and your nebulizer solution - one sample of breztri given, says has two more at home (does not compute) as has not purchased any yet so his samples should have run out    08/03/2019  f/u ov/Loyce Flaming re: GOLD IV  breztri Chief Complaint  Patient presents with   Follow-up    no complaints  Dyspnea:  Better p prednisone from ER p went for pfts was more sob p saba   Cough: none  Sleeping: lie flat no 02  SABA use: mixed up between neb and hfa but only uses avg twice daily, does know now to use up to every 4 h prn  02: prn / not checking sats  rec No change on your inhalers  Prednisone 10 mg 2 daily when doing poorly, one a day thereafter    08/20/2019  f/u ov/[place] office/Amaia Lavallie re: GOLD IV Breztri Chief Complaint  Patient presents with   Follow-up  Dyspnea:  Able to shop at [place] now improved down to pred 10 mg daily   Cough: minimal am and at hs  Sleeping: flat in bed / 2 pillows SABA use:  still not understanding ABC 02: has it, not using    No obvious day to day or daytime variability or assoc excess/ purulent sputum or mucus plugs or hemoptysis or cp or chest tightness, subjective wheeze or overt sinus or hb symptoms.   Sleeping as above without nocturnal  or early am exacerbation  of respiratory  c/o's or need for noct saba. Also denies any obvious fluctuation of symptoms with weather or environmental changes or other aggravating or alleviating factors except as outlined above   No unusual exposure hx or h/o childhood pna/ asthma or knowledge of premature birth.  Current Allergies, Complete Past Medical History, Past Surgical History, Family History, and Social History were reviewed in [employer] record.  ROS  The following are not active complaints unless bolded Hoarseness, sore throat, dysphagia, dental problems, itching, sneezing,  nasal congestion or discharge of excess mucus or purulent secretions, ear ache,   fever, chills, sweats, unintended wt loss or wt gain, classically pleuritic or exertional cp,  orthopnea pnd or arm/hand swelling  or leg swelling, presyncope, palpitations, abdominal pain, anorexia, nausea, vomiting, diarrhea  or change in bowel habits or change in bladder habits,  change in stools or change in urine, dysuria, hematuria,  rash, arthralgias, visual complaints, headache, numbness, weakness or ataxia or problems with walking or coordination,  change in mood or  memory.        Current Meds  Medication Sig   albuterol (PROAIR HFA) 108 (90 Base) MCG/ACT inhaler 2 puffs every 4 hours as needed only  if your can't catch your breath   albuterol (PROVENTIL) (2.5 MG/3ML) 0.083% nebulizer solution Take 3 mLs (2.5 mg total) by nebulization every 4 (four) hours as needed for wheezing or shortness of breath.   aspirin 81 MG tablet Take 81 mg by mouth daily.   Budeson-Glycopyrrol-Formoterol (BREZTRI AEROSPHERE) 160-9-4.8 MCG/ACT AERO Take  2 puffs first thing in am and then another 2 puffs about 12 hours later.   guaiFENesin (MUCINEX) 600 MG 12 hr tablet Take 600 mg by mouth 2 (two) times daily.    predniSONE (DELTASONE) 10 MG tablet 2 daily until better then 1 daily                   Past Medical History:  Diagnosis Date   Asthma    CAP (community acquired pneumonia) 12/10/2018   COPD (chronic obstructive pulmonary disease) ([place])    COPD with acute exacerbation ([place]) 04/18/2018   CVA (cerebral vascular accident) ([place]) 2008   with temporary vision loss    Depression    ECHOCARDIOGRAM, ABNORMAL 03/06/2010   Qualifier: Diagnosis of  By: Purcell Nails, NP, Kathryn     Elevated PSA 2014   no diagnosis pf prostate cancer in 07/2014   History of substance abuse ([place]) 01/14/2011   Marijuana   History of substance abuse ([place]) 01/14/2011   History of tobacco abuse 01/14/2011   Hyperlipidemia    Hypertension    Lobar pneumonia ([place]) 12/13/2018   Marijuana abuse    Nicotine addiction    Prediabetes 2014      Objective:      08/20/2019        130  08/03/2019        122  07/28/2019        121  07/09/2019          122   06/10/2019         122   05/28/19 118 lb 8 oz (53.8 kg)  05/11/19 122 lb (55.3 kg)  04/16/19 122 lb 12.8 oz (55.7 kg)      Vital signs reviewed  08/20/2019  - Note at rest 02 sats  95% on RA        Report Edentulous  HEENT : pt wearing mask not removed for exam due to covid -19 concerns.    NECK :  without JVD/Nodes/TM/ nl carotid upstrokes bilaterally   LUNGS: no acc muscle use,  Mod barrel  contour chest wall with bilateral  Distant bs s audible wheeze and  without cough on insp or exp maneuvers and mod  Hyperresonant  to  percussion bilaterally     CV:  RRR  no s3 or murmur or increase in P2, and no edema   ABD:  soft and nontender with pos mid insp Hoover's  in the supine position. No bruits or organomegaly appreciated, bowel sounds nl  MS:     ext warm without deformities, calf  tenderness, cyanosis or clubbing No obvious joint restrictions   SKIN: warm and dry without lesions    NEURO:  alert, approp, nl sensorium with  no motor or cerebellar deficits apparent.  Assessment

## 2019-08-21 ENCOUNTER — Encounter: Payer: Self-pay | Admitting: Internal Medicine

## 2019-08-21 NOTE — Assessment & Plan Note (Signed)
Quit smoking 2017  - 05/11/2019  After extensive coaching inhaler device,  effectiveness =    90% elipta so rec change to trelegy  - 05/11/2019   Walked on RA   300 ft -@ avg pace stopped due to sob with sats at end  93%  - 05/28/2019  After extensive coaching inhaler device,  effectiveness =    75% changed to breztri trial x 2 week samples -  05/28/2019   Walked RA  approx   300 ft  @ moderately fast pace  stopped due to  Sob/ sats still 90%   - 07/28/2019  After extensive coaching inhaler device,  effectiveness =    75% (sort ti) >continue breztri 2bid PFT's  07/29/19  FEV1 0.63 (24 % ) ratio 0.32  p 0 % improvement from saba p ? prior to study with DLCO  14.78 (63%) corrects to 5.03 (123%)  for alv volume and FV curve classic curvature  - 08/03/2019  After extensive coaching inhaler device,  effectiveness =    75% > continue breztri   - 08/03/19  Prednisone as maint 20 ceiling and 10 mg floor  -  08/03/2019   Walked RA  approx   300 ft  @ moderate pace  stopped due to  End of study no sob and sats 96% at end     Group D in terms of symptom/risk and laba/lama/ICS  therefore appropriate rx at this point >>>  Continue breztri and pred 20 until better then floor of 10 mg daily for now using  The "lowest effective dose approach"  - The proper method of use, as well as anticipated side effects, of a metered-dose inhaler are discussed and demonstrated to the patient. Improved effectiveness after extensive coaching during this visit to a level of approximately 75 % from a baseline of 50 % continue hfa  ABC approach reviewed by holding up each component individually as he appears to be illiterate.         Each maintenance medication was reviewed in detail including emphasizing most importantly the difference between maintenance and prns and under what circumstances the prns are to be triggered using an action plan format where appropriate.  Total time for H and P, chart review, counseling, teaching device and  generating customized AVS unique to this office visit / charting = 23 min

## 2019-08-23 ENCOUNTER — Telehealth: Payer: Self-pay | Admitting: Pharmacy Technician

## 2019-08-23 NOTE — Telephone Encounter (Signed)
Ran test claims for 1 month supply:  Breztri- refill too soon- was filled through [employer] is preferred- $95.40 copay   *Test claims revealed that patient is in the coverage gap. All copays will be more expensive. Patient can apply for patient assistance for [facility].

## 2019-08-23 NOTE — Telephone Encounter (Signed)
-----   Message from Deboraha Sprang, [place] sent at 08/20/2019 10:46 AM EDT -----  ----- Message ----- From: Deboraha Sprang, RPH-CPP Sent: 08/20/2019  10:38 AM EDT To: Tanda Rockers, MD  Silvano Garofano our pharmacy technician is out today and can run a test claim on Monday. Since he has Medicare it is likely that he is already in the donut hole all inhalers would be similar price. Another option is to apply for patient assistance through [place] for [facility].  We will send you test claim information Monday.Thanks. Amber ----- Message ----- From: Tanda Rockers, MD Sent: 08/20/2019  10:31 AM EDT To: Deboraha Sprang, RPH-CPP  What is the cheapest alternative to [facility] on his list

## 2019-08-26 DIAGNOSIS — J449 Chronic obstructive pulmonary disease, unspecified: Secondary | ICD-10-CM | POA: Diagnosis not present

## 2019-09-02 NOTE — Telephone Encounter (Signed)
[place]

## 2019-09-15 ENCOUNTER — Other Ambulatory Visit: Payer: Self-pay | Admitting: *Deleted

## 2019-09-15 ENCOUNTER — Encounter: Payer: Self-pay | Admitting: *Deleted

## 2019-09-15 DIAGNOSIS — R69 Illness, unspecified: Secondary | ICD-10-CM | POA: Diagnosis not present

## 2019-09-15 NOTE — Patient Outreach (Signed)
[name]) Care Management  09/15/2019  [name] 05/07/1950 [id number]   [facility] outreach to complex care patient   Ian [name] was referred to [facility] on 03/24/19 from Ian [name] office staff [name] for Social work consult -pt with severe COPD has no family and lives alone needs to look for assisted living facility but has no transport or anyone to help with process   Insurance:[employer]are 03/05/19 & 03/11/19 07/29/19 [place] ED visit forshortness of breath (sob),COPD exacerbation  [name] has received [facility] services from [facility] RN CMs and SWs yearly since 2016. Last services received in December 2020    Patient is able to verify HIPAA, DOB and Address Reviewed and addressed reason for follow up/care coordination call to [facility] patient- f/u of pulmonary appointment on 07/09/19    Follow up [name] is doing well  He reports he is taking his medications as ordered He was able to inform [facility] RN Cm the names of the correct medications that  He continues to report he is not able to get anyone to assist him with up keep of his home but also has not followed up with going to check for resources at the local senior services  On [address] [place] he reports it is 4 blocks from his home   Social: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 His son recently passed and was buried per pt on 03/24/19. He has no siblings and reports his wife's family "has nothing to do with me since I don't drink or smoke any more" He uses a moped for transportation to go out to medical appointments, to complete errands and go grocery shopping. He denies food insecurity. He reports a male "from the hospital" has assisted but only on occasions. He is not certain of her name ? [name]  He reports being independent to assist with all care needs related to severeshortness of breath (sob)with any  activity. He reports not taking showers, sweeping or cleaning his home related to "becoming short winded".  He reports an eleventh grade education He confirms difficulty in reading and generally has others to assist with reading. He reports he can sign his name.   He informs [facility] RN CM he has been depressed since the death of his mother when he was age 19 and his father was not active in his care. He denies concerns with his appetite, loss of interest in doing things or wanting to harm himself or others. He reports he has to stay strong for himself.He is not on medicine for depression. Social isolation per assessment PHQ 2 =1 today  He reports some sleeping concerns   Conditions:asthma,Chronic obstructive pulmonary disease (COPD),cerebrovascular accident (CVA),Hypertension (HTN),history (hx)ofpneumonia (PNA), depression, hx of substance abuse (marijuana), hx of tobacco abuse,Hyperlipidemia (HLD), prediabetes (last Hg A1c 6.4 on 12/13/18- He denies diagnosis of diabetes)  Fall Denies, None  NTZ:[id number] doesnotwear (had about 5-6 years- needs new ones), nebulizer 3 L O2 at home   Appointments 10/21 [name] pulmonology  12/01/19 Ian Beach NP at pcp office  12/10/19 Ian Beach NP at pcp office  Plans [facility] RN CM will follow up with [name] within the next 28-35 business days  Goals Addressed              This Visit's Progress     Patient Stated   .  [facility]) Patient will  be able to manage his COPD at home (pt-stated)        [place] (see longtitudinal plan of care for additional care plan information)  Current Barriers:  [name] Kitchen Knowledge deficits related to basic understanding of COPD disease process . Cognitive Deficits   Case Manager Clinical Goal(s):  Over the next 60 days, patient will be able to verbalize understanding of COPD action plan and when to seek appropriate levels of medical care  Over the next 90 days, patient will verbalize basic  understanding of COPD disease process and self care activities  Over the next 31 days, patient will not be hospitalized for COPD exacerbation   Interventions:   Provided patient with basic written and verbal COPD education on self care/management/and exacerbation prevention   Provided patient with COPD action plan and reinforced importance of daily self assessment  Provided instruction about proper use of medications used for management of COPD including inhalers  Patient Self Care Activities:  . Takes medications as prescribed including inhalers . Self assesses COPD action plan zone and makes appointment with provider if in the yellow zone for 48 hours without improvement. Leodis Liverpool social connections  Initial goal documentation        Saron Vanorman L. Lavina Hamman, RN, BSN, [place] Coordinator Office number [phone] Main [facility] number [phone] Fax number [phone]

## 2019-09-16 ENCOUNTER — Other Ambulatory Visit: Payer: Self-pay

## 2019-09-26 DIAGNOSIS — J449 Chronic obstructive pulmonary disease, unspecified: Secondary | ICD-10-CM | POA: Diagnosis not present

## 2019-10-07 ENCOUNTER — Other Ambulatory Visit: Payer: Self-pay | Admitting: Family Medicine

## 2019-10-09 ENCOUNTER — Other Ambulatory Visit: Payer: Self-pay | Admitting: Internal Medicine

## 2019-10-12 ENCOUNTER — Other Ambulatory Visit: Payer: Self-pay

## 2019-10-12 ENCOUNTER — Encounter: Payer: Self-pay | Admitting: Family Medicine

## 2019-10-12 ENCOUNTER — Ambulatory Visit (INDEPENDENT_AMBULATORY_CARE_PROVIDER_SITE_OTHER): Payer: Medicare HMO | Admitting: Family Medicine

## 2019-10-12 VITALS — BP 160/85 | HR 115 | Resp 20 | Ht 67.0 in | Wt 130.0 lb

## 2019-10-12 DIAGNOSIS — R7981 Abnormal blood-gas level: Secondary | ICD-10-CM

## 2019-10-12 DIAGNOSIS — J449 Chronic obstructive pulmonary disease, unspecified: Secondary | ICD-10-CM | POA: Diagnosis not present

## 2019-10-12 NOTE — Assessment & Plan Note (Signed)
He is having increased shortness of breath, he is now taking the higher dose of his prednisone- 20mg . He has not called Dr Gustavus Bryant office as of yet. He is not using his oxygen as much as we would like. Low sat on arrival- 84% on RA, started on 2L and returned to 96%. We will continue to look at a portal tank if able given him not having a car. Also advised to call Dr Gustavus Bryant office. I do not feel he need an antibiotic today as sputum is no increased and neither is cough. It is most shortness of breath with chest tightness. Hopefully another day of prednisone will help.

## 2019-10-12 NOTE — Patient Instructions (Signed)
  HAPPY FALL!  I appreciate the opportunity to provide you with care for your health and wellness. Today we discussed: shortness of breath   Follow up: 12/01/2019 as scheduled  No labs or referrals today  We are going to try to work on the portal oxygen tank. Your oxygen needs to be used when you are up and moving around and when you are short of breath. This will help a lot and it makes sure you are getting the oxygen you need.  Please call Dr Gustavus Bryant office tomorrow if you are not feeling better.   Please continue to practice social distancing to keep you, your family, and our community safe.  If you must go out, please wear a mask and practice good handwashing.  It was a pleasure to see you and I look forward to continuing to work together on your health and well-being. Please do not hesitate to call the office if you need care or have questions about your care.  Have a wonderful day and week. With Gratitude, [name], DNP, AGNP-BC

## 2019-10-12 NOTE — Progress Notes (Signed)
Subjective:  Patient ID: [name], male    DOB: [birth year]  Age: 69 y.o. [name]: [id number]  CC:  Chief Complaint  Patient presents with  . Shortness of Breath      HPI  HPI Mr Holtsclaw is a 69 year old male patient of Dr Simpson's. He reports today for an acute visit after starting to feel bad yesterday after it rained. He had increased shortness of breath that started up this morning. He is taking his prednisone as directed by Dr Melvyn Novas, but it has only been today and he has not felt much better. He is unsure when to use his oxygen, but he is unable to carry it with him when he leaves the house due to the tank being to big and he only has the moped to get around. He is not having any increased sputum or cough at this time. He has not tried call Dr Gustavus Bryant office yet.  He denies chest pain, reports some tightness. Oxygen was checked and found to be 84% on arrival and he was put on 2 liters of oxygen and his sats stayed between 96-97. Tachycardia is noted, but he has been using his albuterol inhaler as well.  Today patient denies signs and symptoms of COVID 19 infection including fever, chills, cough, shortness of breath, and headache. Past Medical, Surgical, Social History, Allergies, and Medications have been Reviewed.   Past Medical History:  Diagnosis Date  . Asthma   . CAP (community acquired pneumonia) 12/10/2018  . COPD (chronic obstructive pulmonary disease) ([place])   . COPD with acute exacerbation ([place]) 04/18/2018  . CVA (cerebral vascular accident) ([place]) 2008   with temporary vision loss   . Depression   . ECHOCARDIOGRAM, ABNORMAL 03/06/2010   Qualifier: Diagnosis of  By: Purcell Nails, NP, Curt Bears    . Elevated PSA 2014   no diagnosis pf prostate cancer in 07/2014  . History of substance abuse ([place]) 01/14/2011   Marijuana  . History of substance abuse ([place]) 01/14/2011  . History of tobacco abuse 01/14/2011  . Hyperlipidemia   . Hypertension   . Lobar pneumonia ([place]) 12/13/2018   . Marijuana abuse   . Nicotine addiction   . Prediabetes 2014    Current Meds  Medication Sig  . albuterol (PROAIR HFA) 108 (90 Base) MCG/ACT inhaler 2 puffs every 4 hours as needed only  if your can't catch your breath  . albuterol (PROVENTIL) (2.5 MG/3ML) 0.083% nebulizer solution Take 3 mLs (2.5 mg total) by nebulization every 4 (four) hours as needed for wheezing or shortness of breath.  [name] Kitchen aspirin 81 MG tablet Take 81 mg by mouth daily.  . Budeson-Glycopyrrol-Formoterol (BREZTRI AEROSPHERE) 160-9-4.8 MCG/ACT AERO Take 2 puffs first thing in am and then another 2 puffs about 12 hours later.  [name] Kitchen guaiFENesin (MUCINEX) 600 MG 12 hr tablet Take 600 mg by mouth 2 (two) times daily.   . pravastatin (PRAVACHOL) 40 MG tablet TAKE (1) TABLET BY MOUTH AT BEDTIME.  . predniSONE (DELTASONE) 10 MG tablet TAKE TWO TABLETS BY MOUTH DAILY UNTIL BETTER THEN 1 DAILY  . triamterene-hydrochlorothiazide (MAXZIDE-25) 37.5-25 MG tablet TAKE 1 & 1/2 TABLETS BY MOUTH ONCE DAILY.    ROS:  Review of Systems  Constitutional: Negative.   HENT: Negative.   Eyes: Negative.   Respiratory: Positive for shortness of breath.   Cardiovascular: Negative.   Gastrointestinal: Negative.   Genitourinary: Negative.   Musculoskeletal: Negative.   Skin: Negative.   Neurological: Negative.  Endo/Heme/Allergies: Negative.   Psychiatric/Behavioral: Negative.      Objective:   Today's Vitals: BP (!) 160/85   Pulse (!) 115   Resp 20   Ht 5\' 7"  (1.702 m)   Wt 130 lb (59 kg)   SpO2 97% Comment: w 2lpm supplemental oxygen  BMI 20.36 kg/m  Vitals with BMI 10/12/2019 08/20/2019 08/04/2019  Height 5\' 7"  5\' 7"  5\' 7"   Weight 130 lbs 130 lbs 124 lbs  BMI 20.36 54.27 06.23  Systolic 762 831 517  Diastolic 85 66 78  Pulse 616 72 64     Physical Exam Vitals and nursing note reviewed.  Constitutional:      Appearance: Normal appearance. He is well-developed, well-groomed and normal weight.  HENT:     Head:  Normocephalic and atraumatic.     Right Ear: External ear normal.     Left Ear: External ear normal.     Nose: Nose normal.     Mouth/Throat:     Mouth: Mucous membranes are moist.     Pharynx: Oropharynx is clear.  Eyes:     General:        Right eye: No discharge.        Left eye: No discharge.     Conjunctiva/sclera: Conjunctivae normal.  Cardiovascular:     Rate and Rhythm: Regular rhythm. Tachycardia present.     Pulses: Normal pulses.     Heart sounds: Normal heart sounds.  Pulmonary:     Effort: Pulmonary effort is normal.     Breath sounds: Examination of the right-middle field reveals wheezing. Examination of the left-middle field reveals wheezing. Decreased breath sounds and wheezing present. No rhonchi or rales.  Musculoskeletal:        General: Normal range of motion.     Cervical back: Normal range of motion and neck supple.  Skin:    General: Skin is warm.  Neurological:     General: No focal deficit present.     Mental Status: He is alert and oriented to person, place, and time.  Psychiatric:        Attention and Perception: Attention normal.        Mood and Affect: Mood normal.        Speech: Speech normal.        Behavior: Behavior normal. Behavior is cooperative.        Thought Content: Thought content normal.        Cognition and Memory: Cognition normal.        Judgment: Judgment normal.      Assessment   1. COPD GOLD IV/ group D    2. Low oxygen saturation     Tests ordered No orders of the defined types were placed in this encounter.    Plan: Please see assessment and plan per problem list above.   No orders of the defined types were placed in this encounter.   Patient to follow-up in 12/01/2019 as scheduled   Perlie Mayo, NP

## 2019-10-12 NOTE — Assessment & Plan Note (Signed)
Will look at getting portal tank for him. Educated on the use of the oxygen when moving around and at home. He verbalized understanding.

## 2019-10-13 ENCOUNTER — Other Ambulatory Visit: Payer: Self-pay | Admitting: *Deleted

## 2019-10-13 ENCOUNTER — Other Ambulatory Visit: Payer: Self-pay

## 2019-10-13 NOTE — Patient Outreach (Signed)
[facility]) Care Management  10/13/2019  [name] [birth year] [id number]  [facility] outreach to complex care patient   [name] was referred to [facility] on 03/24/19 from Dr Bari Mantis office staff Nelva Bush for Social work consult -pt with severe COPD has no family and lives alone needs to look for assisted living facility but has no transport or anyone to help with process   Insurance:[employer] 03/05/19 & 3/4/217/22/21[facility] ED visit forshortness of breath (sob),COPD exacerbation  [name] has received [facility] services from [facility] RN CMs and SWs yearly since 2016. Last services received in December 2020   Patient is able to verify HIPAA, DOB and Address Reviewed and addressed reason for follow up/care coordination call to [facility] patient- f/u of pulmonary appointment on 07/09/19    Follow up COPD [name] reports today "this weather got me" "It was raining earlier and then I started breathing hard" (trigger) while inside his home not outside He reports he was taking a nebulizer treatment when [facility] RN CM outreached  Clean nebulizer once a month  He was encouraged to follow the action plan provided by his pulmonologist with his medications He reports he is feeling "a little better" after receiving some of the nebulizer treatment He reports he will complete the nebulizer treatment when conclude this outreach  He is scheduled to be seen by Dr Melvyn Novas, pulmonology, this week on 10/15/19   Possible environment concerns Reports he has been in his home about 20 years since  around 2000 and is unsure if he has mildew/mold He report he did have some water leaks about 5 years ago in the kitchen. He reports replacing and putting in new tile floor in 2020 He still confirm he has carpet that needs removing and he is not able to clean it well with his respiratory issues (trigger) He has not followed up with resources discussed like going to the local senior center  He  reports he has applied for public assistance before but was informed he was not able to receive assistance related to owning his home. He confirms he does not have a car and has not driven in 10 years. He sold his car after "I became sick " He recalls working with a male with the last name Broadnax ("black lady") at the [employer] but was informed she was not longer there he reports  Last outreach from [facility] in May 2020 [name] reported he did not need offered services  This was discussed with him today He has been referred to [facility] SW x 2 by this [facility] RN CM.   Plans [facility] RN CM will follow up with [name] within the next 30 business days Pt encouraged to return a call to [facility] RN CM prn Goals Addressed              This Visit's Progress     Patient Stated   .  [facility]) Find Help in My Community (pt-stated)   Not on track     Follow Up Date 11/10/19    - follow-up on any referrals for help I am given - have a back-up plan    Why is this important?   Knowing how and where to find help for yourself or family in your neighborhood and community is an important skill.  You will want to take some steps to learn how.    Notes:     .  [facility])  Learn More About My Health (pt-stated)   On track     Follow Up Date 11/10/19    - ask questions - repeat what I heard to make sure I understand - bring a list of my medicines to the visit - speak up when I don't understand    Why is this important?   The best way to learn about your health and care is by talking to the doctor and nurse.  They will answer your questions and give you information in the way that you like best.    Notes:     .  COMPLETED: [facility]) Patient will be able to manage his COPD at home (pt-stated)        [place] (see longtitudinal plan of care for additional care plan information)  Current Barriers:  [name] Kitchen Knowledge deficits related to basic understanding of COPD disease process . Cognitive  Deficits   Case Manager Clinical Goal(s):  Over the next 60 days, patient will be able to verbalize understanding of COPD action plan and when to seek appropriate levels of medical care  Over the next 90 days, patient will verbalize basic understanding of COPD disease process and self care activities  Over the next 31 days, patient will not be hospitalized for COPD exacerbation  10/13/19 pt managing COPD at home well after establishing care with New pulmonologist, Today with increase symptoms after rain and is following his COPD/asthma action plan but administering a nebulizer tx, Aware of triggers, no admissions  Changed to patient care plans (elsevier)   Interventions:   Provided patient with basic written and verbal COPD education on self care/management/and exacerbation prevention   Provided patient with COPD action plan and reinforced importance of daily self assessment  Provided instruction about proper use of medications used for management of COPD including inhalers  Patient Self Care Activities:  . Takes medications as prescribed including inhalers . Self assesses COPD action plan zone and makes appointment with provider if in the yellow zone for 48 hours without improvement. Leodis Liverpool social connections  Please see past updates related to this goal by clicking on the "Past Updates" button in the selected goal      .  [facility]) Track and Manage My Triggers (pt-stated)        Follow Up Date 11/10/19     - identify and remove indoor air pollutants - limit outdoor activity during cold weather    Why is this important?   Triggers are activities or things, like tobacco smoke or cold weather, that make your COPD (chronic obstructive pulmonary disease) flare-up.  Knowing these triggers helps you plan how to stay away from them.  When you cannot remove them, you can learn how to manage them.     Notes:        Kahne Helfand L. Lavina Hamman, RN, BSN, [place]  Coordinator Office number [phone] Main [facility] number [phone] Fax number [phone]

## 2019-10-15 ENCOUNTER — Ambulatory Visit (HOSPITAL_COMMUNITY)
Admission: RE | Admit: 2019-10-15 | Discharge: 2019-10-15 | Disposition: A | Discharge status: Home / Self Care | Payer: Medicare HMO | Source: Ambulatory Visit | Attending: Internal Medicine | Admitting: Internal Medicine

## 2019-10-15 ENCOUNTER — Other Ambulatory Visit: Payer: Self-pay

## 2019-10-15 ENCOUNTER — Ambulatory Visit (INDEPENDENT_AMBULATORY_CARE_PROVIDER_SITE_OTHER): Payer: Medicare HMO | Admitting: Internal Medicine

## 2019-10-15 ENCOUNTER — Encounter: Payer: Self-pay | Admitting: Internal Medicine

## 2019-10-15 VITALS — BP 132/78 | HR 101 | Temp 97.8°F | Ht 67.0 in | Wt 126.4 lb

## 2019-10-15 DIAGNOSIS — Z23 Encounter for immunization: Secondary | ICD-10-CM | POA: Diagnosis not present

## 2019-10-15 DIAGNOSIS — R918 Other nonspecific abnormal finding of lung field: Secondary | ICD-10-CM | POA: Insufficient documentation

## 2019-10-15 DIAGNOSIS — J984 Other disorders of lung: Secondary | ICD-10-CM | POA: Diagnosis not present

## 2019-10-15 DIAGNOSIS — J449 Chronic obstructive pulmonary disease, unspecified: Secondary | ICD-10-CM | POA: Diagnosis not present

## 2019-10-15 NOTE — Assessment & Plan Note (Signed)
Quit smoking 2017  - 05/11/2019  After extensive coaching inhaler device,  effectiveness =    90% elipta so rec change to trelegy  - 05/11/2019   Walked on RA   300 ft -@ avg pace stopped due to sob with sats at end  93%  - 05/28/2019  After extensive coaching inhaler device,  effectiveness =    75% changed to breztri trial x 2 week samples -  05/28/2019   Walked RA  approx   300 ft  @ moderately fast pace  stopped due to  Sob/ sats still 90%   - 07/28/2019  After extensive coaching inhaler device,  effectiveness =    75% (sort ti) >continue breztri 2bid PFT's  07/29/19  FEV1 0.63 (24 % ) ratio 0.32  p 0 % improvement from saba p ? prior to study with DLCO  14.78 (63%) corrects to 5.03 (123%)  for alv volume and FV curve classic curvature  - 08/03/2019   continue breztri   - 08/03/19  Prednisone as maint 20 ceiling and 10 mg floor  -  08/03/2019   Walked RA  approx   300 ft  @ moderate pace  stopped due to  End of study no sob and sats 96% at end  - 10/15/2019  After extensive coaching inhaler device,  effectiveness =    75% (short ti but otherwise quite good )   Group D in terms of symptom/risk and laba/lama/ICS  therefore appropriate rx at this point >>>  breztri 2 bid and prednisone for now :  The goal with a chronic steroid dependent illness is always arriving at the lowest effective dose that controls the disease/symptoms and not accepting a set "formula" which is based on statistics or guidelines that don't always take into account patient  variability or the natural hx of the dz in every individual patient, which may well vary over time.  For now therefore I recommend the patient maintain  20 mg ceiling/ 10 mg floor based on symptoms and need for rescue  Re saba: I spent extra time with pt today reviewing appropriate use of albuterol for prn use on exertion with the following points: 1) saba is for relief of sob that does not improve by walking a slower pace or resting but rather if the pt does not  improve after trying this first. 2) If the pt is convinced, as many are, that saba helps recover from activity faster then it's easy to tell if this is the case by re-challenging : ie stop, take the inhaler, then p 5 minutes try the exact same activity (intensity of workload) that just caused the symptoms and see if they are substantially diminished or not after saba 3) if there is an activity that reproducibly causes the symptoms, try the saba 15 min before the activity on alternate days   If in fact the saba really does help, then fine to continue to use it prn but advised may need to look closer at the maintenance regimen being used to achieve better control of airways disease with exertion.

## 2019-10-15 NOTE — Progress Notes (Signed)
[name], male    DOB: 10/26/1950,  [name]: [id number]   Brief patient profile:  33 yobm quit smoking 2017/ likely illiterate   grew up with dx of asthma but only started needing in saba in HS when started smoking and eventually placed on advair 500 and followed by [name] and referred to pulmonary clinic in [facility]  05/11/2019 by [name] p [name] retired     History of Present Illness  05/11/2019  Pulmonary/ 1st office eval/Dlisa Barnwell on advair 500  Chief Complaint  Patient presents with  . Pulmonary Consult    Referred by [name], NP. Former patient of [name]. He states his breathing has been worse since he had first covid vaccine 03/03/19. He gets SOB when he wakes up in the am and starts moving around. He has cough with cream colored sputum. He is using his albuterol inhaler about every 6 hours and duonebs 2 x daily.   Dyspnea:  Can't walk a block = MMRC3 = can't walk 100 yards even at a slow pace at a flat grade s stopping due to sob   Cough: min white mucus  Sleep: bed is flat 2 pillows  SABA use: way too much as above 02 :  Only uses 02 p sits down  rec Stop wixela, atrovent (ipatropium solution) and combivent  Plan A = Automatic = Always=    Trelegy one click each am - take two good drags Plan B = Backup (to supplement plan A, not to replace it) Only use your albuterol inhaler (PROAIR)  as a rescue medication   Plan C = Crisis (instead of Plan B but only if Plan B stops working) - only use your albuterol nebulizer if you first try Plan B and it fails to help > ok to use the nebulizer up to every 4 hours but if start needing it regularly call for immediate appointment  Prednisone 10 mg take  4 each am x 2 days,   2 each am x 2 days,  1 each am x 2 days and stop  Make sure you check your oxygen saturations at highest level of activity (not after you stop!) to be sure it stays over 90% and adjust upward to maintain this level if needed but remember to turn it back to  previous settings when you stop (to conserve your supply).     05/28/2019  Acute  ov/[name] re: copd / moderna 2nd shot 3/24  - totally confused with meds  Chief Complaint  Patient presents with  . Acute Visit    Pt states his breathing is not improving since the last visit 05/11/19- using proair 2 x daily until he just ran out. He also c/o cough with green sputum.   Dyspnea:  50 ft  - Pace Moderately  Fast from observation at ov  Cough: green x 2 months (said min white on 05/11/19 eval)   Sleeping: worse x 3 nights  SABA use: ran out of proair one day prior to ov/ very poor concept of maint vs prns and using trelegy sometimes twice daily  02: 02 prn but mostly uses p exertion/ does not monitor sats  rec Depomedrol 120 mg IM  Augmentin 875 mg take one pill twice daily  X 10 days - take at breakfast and supper with large glass of water. Change your inhalers: BREZTRI  AUTOMATICALLY =  take 2 puffs first thing in am and then another 2 puffs about 12 hours  later.  Only use your albuterol nebulizer  as a rescue medication   06/10/2019  f/u ov/[name] re: copd group D symptoms/ risk  Chief Complaint  Patient presents with  . Follow-up    Breathing has improved some but not back to normal baseline. He is coughing less but still has some minimal green sputum.   Dyspnea:  Some better - [employer] walking now  Cough: much better but still some am green mucus  Sleeping: able to lie flat  SABA use: using neb in am/ still has full breztri so not using it correctly  02: only uses prn  rec We will call to schedule sinus and chest CT prior to next office visit  Plan A = Automatic = Always=    trelegy one click each am first thing and out thru nose   [name] is the same medication but Take 2 puffs first thing in am and then another 2 puffs about 12 hours later and out thru the nose  Plan B = Backup (to supplement plan A, not to replace it) Only use your albuterol inhaler PROAIR)  as a rescue medication   Plan  C = Crisis (instead of Plan B but only if Plan B stops working) - only use your albuterol nebulizer if you first try Plan B and it fails to help > ok to use the nebulizer up to every 4 hours but if start needing it regularly call for immediate appointment Please schedule a follow up office visit in 4 weeks, sooner if needed  with all medications /inhalers/ solutions   07/09/2019  f/u ov/[name] re: severe copd clinically  Chief Complaint  Patient presents with  . Follow-up    Breathing had been doing better, but worse this am and he relates to rainy weather. He is using his albuterol inhaler 2 x per day and neb about 2 x per day.    Dyspnea: walking [employer] once  A week /  Cough: feels choked up / mucus green x months  Sleeping: able to sleep on 2 pillows / bed flat  SABA use: way too much   02: prn only  rec Plan A = Automatic = Always=    Stop trelegy and start Breztri Take 2 puffs first thing in am and then another 2 puffs about 12 hours later.  Work on inhaler technique:   Plan B = Backup (to supplement plan A, not to replace it) Only use your albuterol inhaler as a rescue medicatio  Plan C = Crisis (instead of Plan B but only if Plan B stops working) - only use your albuterol nebulizer if you first try Plan B and it fails to help > ok to use the nebulizer up to every 4 hours but if start needing it regularly call for immediate appointmentTry albuterol 15 min before (red inhaler one day, neb the next and nothing on the third day) an activity that you know would make you short of breath  Levaquin 500 mg daily x 10 days We will set you up for cxr and pfts in about 2 weeks same day and [name] and I will call you with results Please schedule a follow up office visit in 6 weeks, call sooner if needed    07/28/2019  Acute  ov/[place] office/Lorilei Horan re: copd group D /illiterate Chief Complaint  Patient presents with  . Acute Visit    [name] due to him not feeling well for the last  day. He has  been breathing harder than normal and that "his chest is stopping up" denies cough   Dyspnea:  Worse since mowed grass in rain 7/17 assoc sense of chest congestion and tightness  Cough: better since levaquin completed Sleeping: ok  SABA use: baseline bid hfa and nebulizer "once a day" - no insight at all into prn rx  02: only uses prn/ does not check sats    rec Plan A = Automatic = Always=    Breztri Take 2 puffs first thing in am and then another 2 puffs about 12 hours later.  Work on inhaler technique: Plan B = Backup (to supplement plan A, not to replace it) Only use your albuterol inhaler as a rescue medication Plan C = Crisis (instead of Plan B but only if Plan B stops working) - only use your albuterol nebulizer if you first try Plan B  keep appt for your PFTs at 9 am tomorrowPlease remember to go to the  x-ray department  for your tests - we will call you with the results when they are available  We need to see you at the end of next week with your inhalers (all of them) and your nebulizer solution - one sample of breztri given, says has two more at home (does not compute) as has not purchased any yet so his samples should have run out    08/03/2019  f/u ov/Javar Eshbach re: GOLD IV  breztri Chief Complaint  Patient presents with  . Follow-up    no complaints  Dyspnea:  Better p prednisone from ER p went for pfts was more sob p saba   Cough: none  Sleeping: lie flat no 02  SABA use: mixed up between neb and hfa but only uses avg twice daily, does know now to use up to every 4 h prn  02: prn / not checking sats  rec No change on your inhalers  Prednisone 10 mg 2 daily when doing poorly, one a day thereafter    08/20/2019  f/u ov/[place] office/Alpheus Stiff re: GOLD IV Breztri Chief Complaint  Patient presents with  . Follow-up  Dyspnea:  Able to shop at [employer] now improved down to pred 10 mg daily   Cough: minimal am and at hs  Sleeping: flat in bed / 2 pillows SABA use:  still not understanding ABC 02: has it, not using  rec No change rx   10/15/2019  f/u ov/[place] office/Madalynn Pickelsimer re: COPD GOLD IV/ breztri 2bid / illiterate/ still on pred 10 mg x 2 "until better" and says he's better but has not redced dose Chief Complaint  Patient presents with  . Follow-up    non productive cough, shortnes of breath with activity  Dyspnea:  Shopping at [employer] slow pace = MMRC3 = can't walk 100 yards even at a slow pace at a flat grade s stopping due to sob   Cough: minimal dry Sleeping: flat bed /2 pillows  SABA use: avg one daily hfa/ one neb when overdoes it  02: not now using either hs or daytime    No obvious day to day or daytime variability or assoc excess/ purulent sputum or mucus plugs or hemoptysis or cp or chest tightness, subjective wheeze or overt sinus or hb symptoms.   Sleeping  without nocturnal  or early am exacerbation  of respiratory  c/o's or need for noct saba. Also denies any obvious fluctuation of symptoms with weather or environmental changes or other aggravating or alleviating factors except as outlined  above   No unusual exposure hx or h/o childhood pna/ asthma or knowledge of premature birth.  Current Allergies, Complete Past Medical History, Past Surgical History, Family History, and Social History were reviewed in [employer] record.  ROS  The following are not active complaints unless bolded Hoarseness, sore throat, dysphagia, dental problems, itching, sneezing,  nasal congestion or discharge of excess mucus or purulent secretions, ear ache,   fever, chills, sweats, unintended wt loss or wt gain, classically pleuritic or exertional cp,  orthopnea pnd or arm/hand swelling  or leg swelling, presyncope, palpitations, abdominal pain, anorexia, nausea, vomiting, diarrhea  or change in bowel habits or change in bladder habits, change in stools or change in urine, dysuria, hematuria,  rash, arthralgias, visual complaints,  headache, numbness, weakness or ataxia or problems with walking or coordination,  change in mood or  memory.        Current Meds  Medication Sig  . albuterol (PROAIR HFA) 108 (90 Base) MCG/ACT inhaler 2 puffs every 4 hours as needed only  if your can't catch your breath  . albuterol (PROVENTIL) (2.5 MG/3ML) 0.083% nebulizer solution Take 3 mLs (2.5 mg total) by nebulization every 4 (four) hours as needed for wheezing or shortness of breath.  [name] Kitchen aspirin 81 MG tablet Take 81 mg by mouth daily.  . Budeson-Glycopyrrol-Formoterol (BREZTRI AEROSPHERE) 160-9-4.8 MCG/ACT AERO Take 2 puffs first thing in am and then another 2 puffs about 12 hours later.  [name] Kitchen guaiFENesin (MUCINEX) 600 MG 12 hr tablet Take 600 mg by mouth 2 (two) times daily.   . pravastatin (PRAVACHOL) 40 MG tablet TAKE (1) TABLET BY MOUTH AT BEDTIME.  . predniSONE (DELTASONE) 10 MG tablet TAKE TWO TABLETS BY MOUTH DAILY UNTIL BETTER THEN 1 DAILY  . triamterene-hydrochlorothiazide (MAXZIDE-25) 37.5-25 MG tablet TAKE 1 & 1/2 TABLETS BY MOUTH ONCE DAILY.                    Past Medical History:  Diagnosis Date  . Asthma   . CAP (community acquired pneumonia) 12/10/2018  . COPD (chronic obstructive pulmonary disease) ([place])   . COPD with acute exacerbation ([place]) 04/18/2018  . CVA (cerebral vascular accident) ([place]) 2008   with temporary vision loss   . Depression   . ECHOCARDIOGRAM, ABNORMAL 03/06/2010   Qualifier: Diagnosis of  By: Purcell Nails, NP, Curt Bears    . Elevated PSA 2014   no diagnosis pf prostate cancer in 07/2014  . History of substance abuse ([place]) 01/14/2011   Marijuana  . History of substance abuse ([place]) 01/14/2011  . History of tobacco abuse 01/14/2011  . Hyperlipidemia   . Hypertension   . Lobar pneumonia ([place]) 12/13/2018  . Marijuana abuse   . Nicotine addiction   . Prediabetes 2014      Objective:    10/15/2019        126  08/20/2019        130  08/03/2019        122  07/28/2019        121  07/09/2019          122    06/10/2019         122   05/28/19 118 lb 8 oz (53.8 kg)  05/11/19 122 lb (55.3 kg)  04/16/19 122 lb 12.8 oz (55.7 kg)      Vital signs reviewed  10/15/2019  - Note at rest 02 sats  96% on RA  Report Edentulous   amb somber thin bf nad  HEENT : pt wearing mask not removed for exam due to covid -19 concerns.    NECK :  without JVD/Nodes/TM/ nl carotid upstrokes bilaterally   LUNGS: no acc muscle use,  Mod barrel  contour chest wall with bilateral  Distant bs s audible wheeze and  without cough on insp or exp maneuvers and mod  Hyperresonant  to  percussion bilaterally     CV:  RRR  no s3 or murmur or increase in P2, and no edema   ABD:  soft and nontender with pos mid insp Hoover's  in the supine position. No bruits or organomegaly appreciated, bowel sounds nl  MS:     ext warm without deformities, calf tenderness, cyanosis or clubbing No obvious joint restrictions   SKIN: warm and dry without lesions    NEURO:  alert, approp, nl sensorium with  no motor or cerebellar deficits apparent.         CXR PA and Lateral:   10/15/2019 :    I personally reviewed images and agree with radiology impression as follows:   Lungs are somewhat hyperexpanded No acute cardiopulmonary disease.      Assessment

## 2019-10-15 NOTE — Patient Instructions (Addendum)
No change medications   Please remember to go to the  x-ray department  @  [facility] for your tests - we will call you with the results when they are available     Please schedule a follow up visit in 3 months but call sooner if needed

## 2019-10-16 ENCOUNTER — Encounter: Payer: Self-pay | Admitting: Internal Medicine

## 2019-10-16 NOTE — Assessment & Plan Note (Addendum)
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 Constellation of findings is generally consistent with ongoing, worsened atypical infection, particularly atypical mycobacterium. There may be a component or history of aspiration. -07/09/2019  rec  levaquin 500 mg daily x 10 days   - cxr 10/15/2019 clear   No additional w/u planned          Each maintenance medication was reviewed in detail including emphasizing most importantly the difference between maintenance and prns and under what circumstances the prns are to be triggered using an action plan format where appropriate.  Total time for H and P, chart review, counseling, teaching device and generating customized AVS unique to this office visit / charting = 20 min

## 2019-10-26 DIAGNOSIS — J449 Chronic obstructive pulmonary disease, unspecified: Secondary | ICD-10-CM | POA: Diagnosis not present

## 2019-10-27 ENCOUNTER — Other Ambulatory Visit: Payer: Self-pay

## 2019-10-27 ENCOUNTER — Other Ambulatory Visit: Payer: Self-pay | Admitting: *Deleted

## 2019-10-27 ENCOUNTER — Encounter: Payer: Self-pay | Admitting: *Deleted

## 2019-10-27 NOTE — Patient Outreach (Signed)
[facility]) Care Management  10/27/2019  [name] 09-07-1950 [id number]   [name] outreach to complex care patient   Ian [name] was referred to [name] on 03/24/19 from Dr Bari Mantis office staff Nelva Bush for Social work consult -pt with severe COPD has no family and lives alone needs to look for assisted living facility but has no transport or anyone to help with process   Insurance:[employer] 03/05/19 & 3/4/217/22/21[facility] ED visit forshortness of breath (sob),COPD exacerbation  Ian [name] has received [facility] services from [facility] RN CMs and SWs yearly since 2016. Last services received in December 2020   Patient is able to verify HIPAA, DOB and Address Reviewed and addressed reason for follow up/care coordination call to [facility] patient- f/u of community resources needed  Follow up Community resources  [facility] RN CM outreached to [place] center prior to the scheduled time to speak with [name] and left a message for [name] [phone] to inquire about possible community resources for [name] related to house cleaning and lawn care  No return call at the time of outreach to patient  [facility] RN CM discussed [facility] RN CM's outreach to [place] at the [employer] center with the voice message left requesting a return call to him. He voiced understanding an appreciation [name] reports he had a male who visited this week who assisted with mowing his lawn and informed him he would return to take up his rug in his house possibly by Friday. [facility] RN CM completed a conference call with [name] to 3 lawn care companies in [place] area ( [employer], [employer] and [employer]) Messages left. Contact made with [name] and [name] pf [employer]) [name] scheduled for each to visit to discuss his lawn care needs. [name] wants to wait to see if the male who visited him will assist with the rug   COPD  Ian  [name] reports he is doing okay  He reports he continues to have episodes of being shortness of breath (sob) with exertion, too much walking He confirms his action plan that includes resting, monitoring for increase symptoms, following up with Dr Melvyn Novas and taking his medications as ordered, is helping. He confirms he is following MD instructions on the use of COPD medicines [facility] RN CM encouraged him to remain active as much as possible   [facility] RN CM will follow up with [name] within the next 30 business days Pt encouraged to return a call to [name] RN CM prn Goals Addressed              This Visit's Progress     Patient Stated   .  [name]) Find Help in My Community (pt-stated)   On track     Follow Up Date 11/10/19    - follow-up on any referrals for help I am given - have a back-up plan    Why is this important?   Knowing how and where to find help for yourself or family in your neighborhood and community is an important skill.  You will want to take some steps to learn how.    Notes: collaborated with [facility] RN CM with outreach to lawn care providers-to follow up with any calls related to lawn or home care  Spoke with a male in the community and had lawn mowed on this week He wants this  male to be his back up plan    .  [name]) Learn More About My Health (pt-stated)   On track     Follow Up Date 11/10/19    - ask questions - repeat what I heard to make sure I understand - bring a list of my medicines to the visit - speak up when I don't understand    Why is this important?   The best way to learn about your health and care is by talking to the doctor and nurse.  They will answer your questions and give you information in the way that you like best.    Notes: He reports he is understanding his action plan better and trying to ask questions     .  [facility]) Track and Manage My Triggers (pt-stated)   On track     Follow Up Date 11/10/19     - identify and remove indoor air  pollutants - limit outdoor activity during cold weather    Why is this important?   Triggers are activities or things, like tobacco smoke or cold weather, that make your COPD (chronic obstructive pulmonary disease) flare-up.  Knowing these triggers helps you plan how to stay away from them.  When you cannot remove them, you can learn how to manage them.     Notes: working on the task to have his carpet removed  Continues to be as active as possible         Jassmin Kemmerer L. Lavina Hamman, RN, BSN, [place] Coordinator Office number [phone] [name] number [phone] Fax number [phone]

## 2019-10-28 ENCOUNTER — Telehealth: Payer: Self-pay | Admitting: Internal Medicine

## 2019-10-28 NOTE — Telephone Encounter (Signed)
ATC Juliann Pulse- unable to leave message- Mailbox full.  Will call back

## 2019-10-29 NOTE — Telephone Encounter (Signed)
Lm x2 for [place]. Will close encounter per office protocol.

## 2019-11-10 ENCOUNTER — Other Ambulatory Visit: Payer: Self-pay | Admitting: *Deleted

## 2019-11-10 ENCOUNTER — Other Ambulatory Visit: Payer: Self-pay

## 2019-11-10 NOTE — Patient Outreach (Signed)
[place] [facility]) Care Management  11/10/2019  [name] 27-Aug-1950 [id number]   [facility] [place] outreach to complex care patient   Ian [name] was referred to [facility] on 03/24/19 from Dr Bari Mantis office staff Nelva Bush for Social work consult -pt with severe COPD has no family and lives alone needs to look for assisted living facility but has no transport or anyone to help with process   Insurance: [employer] 03/05/19 & 3/4/217/22/21[facility] ED visit forshortness of breath (sob),COPD exacerbation  Ian [name] has received [facility] services from [facility] RN CMs and SWs yearly since 2016. Last services received in December 2020   Patient is able to verify HIPAA, DOB and Address Reviewed and addressed reason for follow up/care coordination call to [facility] patient- f/u of community resources COPD   Follow up Community resources  Pt reports he has not received calls from [facility] center [name] [phone], all around lawn care, [employer] nor [employer] [name] reports the male who visited who assisted with mowing his lawn did not return to take up his rug in his house Ian Nez confirms he was able to remove the rug in his home himself.  [facility] RN CM discussed him purchasing an [employer] that is lighter than a standard vacuum cleaner He wants to postpone further lawn care assist until after the winter  COPD  [name] reports he is doing okay related to his COPD symptoms He confirms his action plan that includes resting, monitoring for increase symptoms, following up with Dr Melvyn Novas and taking his medications as ordered, is helping. He confirms he is following MD instructions on the use of COPD medicines. He is still taking prednisone He is able to state his treatment plan for daily and emergency use of inhalers/nebulizer [facility] RN CM encouraged him to remain active as much as possible  He was encouraged to speak with his pcp about his  third covid booster shot this month He had Moderna SARS-COVID-2 Vaccinations 03/31/2019, 03/03/2019  [facility] RN CM will follow up with [name] within the next 30-45  business days Pt encouraged to return a call to [facility] RN CM prn  Goals      Patient Stated   .  [facility]) Find Help in My Community (pt-stated)      Follow Up Date 12/13/19   - follow-up on any referrals for help I am given - have a back-up plan    Why is this important?   Knowing how and where to find help for yourself or family in your neighborhood and community is an important skill.  You will want to take some steps to learn how.    Notes: 10/27/19 collaborated with [place] with outreach to lawn care providers-to follow up with any calls related to lawn or home care  Spoke with a male in the community and had lawn mowed on this week He wants this male to be his back up plan    .  [facility]) Learn More About My Health (pt-stated)      Follow Up Date 12/13/19   - ask questions - repeat what I heard to make sure I understand - bring a list of my medicines to the visit - speak up when I don't understand    Why is this important?   The best way to learn about your health and care is by talking to the doctor and nurse.  They  will answer your questions and give you information in the way that you like best.    Notes: He reports he is understanding his action plan better and trying to ask questions     .  [facility]) Track and Manage My Triggers (pt-stated)      Follow Up Date 12/13/19     - identify and remove indoor air pollutants - limit outdoor activity during cold weather    Why is this important?   Triggers are activities or things, like tobacco smoke or cold weather, that make your COPD (chronic obstructive pulmonary disease) flare-up.  Knowing these triggers helps you plan how to stay away from them.  When you cannot remove them, you can learn how to manage them.     Notes: 11/10/19 He had his carpet removed    Continues to be as active as possible        Other   .  Increase water intake      Patient would like to increase his water intake to at least 4 glasses a day.        Jalecia Leon L. Lavina Hamman, RN, BSN, [place] Coordinator Office number [phone] [facility] number [phone] Fax number [phone]

## 2019-11-16 ENCOUNTER — Other Ambulatory Visit: Payer: Self-pay

## 2019-11-16 MED ORDER — PRAVASTATIN SODIUM 40 MG PO TABS: ORAL_TABLET | ORAL | 1 refills | Status: DC

## 2019-11-16 MED ORDER — ALBUTEROL SULFATE (2.5 MG/3ML) 0.083% IN NEBU: 2.5000 mg | INHALATION_SOLUTION | RESPIRATORY_TRACT | 2 refills | Status: DC | PRN

## 2019-11-16 MED ORDER — TRIAMTERENE-HCTZ 37.5-25 MG PO TABS: 1.5000 | ORAL_TABLET | Freq: Every day | ORAL | 1 refills | Status: DC

## 2019-11-19 ENCOUNTER — Other Ambulatory Visit: Payer: Self-pay | Admitting: Family Medicine

## 2019-11-19 MED ORDER — ALBUTEROL SULFATE (2.5 MG/3ML) 0.083% IN NEBU
2.5000 mg | INHALATION_SOLUTION | Freq: Four times a day (QID) | RESPIRATORY_TRACT | 5 refills | Status: DC | PRN
Start: 2019-11-19 — End: 2020-08-21

## 2019-11-25 ENCOUNTER — Other Ambulatory Visit: Payer: Self-pay

## 2019-11-25 ENCOUNTER — Encounter: Payer: Self-pay | Admitting: Internal Medicine

## 2019-11-25 ENCOUNTER — Ambulatory Visit (HOSPITAL_COMMUNITY)
Admission: RE | Admit: 2019-11-25 | Discharge: 2019-11-25 | Disposition: A | Discharge status: Home / Self Care | Payer: Medicare HMO | Source: Ambulatory Visit | Attending: Internal Medicine | Admitting: Internal Medicine

## 2019-11-25 ENCOUNTER — Ambulatory Visit (INDEPENDENT_AMBULATORY_CARE_PROVIDER_SITE_OTHER): Payer: Medicare HMO | Admitting: Internal Medicine

## 2019-11-25 ENCOUNTER — Other Ambulatory Visit (HOSPITAL_COMMUNITY)
Admission: RE | Admit: 2019-11-25 | Discharge: 2019-11-25 | Disposition: A | Discharge status: Home / Self Care | Payer: Medicare HMO | Source: Ambulatory Visit | Attending: Internal Medicine | Admitting: Internal Medicine

## 2019-11-25 DIAGNOSIS — R0602 Shortness of breath: Secondary | ICD-10-CM | POA: Diagnosis not present

## 2019-11-25 DIAGNOSIS — J449 Chronic obstructive pulmonary disease, unspecified: Secondary | ICD-10-CM

## 2019-11-25 DIAGNOSIS — J90 Pleural effusion, not elsewhere classified: Secondary | ICD-10-CM | POA: Diagnosis not present

## 2019-11-25 DIAGNOSIS — I10 Essential (primary) hypertension: Secondary | ICD-10-CM | POA: Diagnosis not present

## 2019-11-25 DIAGNOSIS — R0789 Other chest pain: Secondary | ICD-10-CM | POA: Diagnosis not present

## 2019-11-25 DIAGNOSIS — R0600 Dyspnea, unspecified: Secondary | ICD-10-CM

## 2019-11-25 DIAGNOSIS — R0609 Other forms of dyspnea: Secondary | ICD-10-CM

## 2019-11-25 LAB — CBC WITH DIFFERENTIAL/PLATELET
Abs Immature Granulocytes: 0.1 10*3/uL — ABNORMAL HIGH (ref 0.00–0.07)
Basophils Absolute: 0 10*3/uL (ref 0.0–0.1)
Basophils Relative: 0 %
Eosinophils Absolute: 0 10*3/uL (ref 0.0–0.5)
Eosinophils Relative: 0 %
HCT: 42.1 % (ref 39.0–52.0)
Hemoglobin: 13.4 g/dL (ref 13.0–17.0)
Immature Granulocytes: 1 %
Lymphocytes Relative: 2 %
Lymphs Abs: 0.3 10*3/uL — ABNORMAL LOW (ref 0.7–4.0)
MCH: 31.1 pg (ref 26.0–34.0)
MCHC: 31.8 g/dL (ref 30.0–36.0)
MCV: 97.7 fL (ref 80.0–100.0)
Monocytes Absolute: 0.3 10*3/uL (ref 0.1–1.0)
Monocytes Relative: 2 %
Neutro Abs: 11.1 10*3/uL — ABNORMAL HIGH (ref 1.7–7.7)
Neutrophils Relative %: 95 %
Platelets: 238 10*3/uL (ref 150–400)
RBC: 4.31 MIL/uL (ref 4.22–5.81)
RDW: 13.2 % (ref 11.5–15.5)
WBC: 11.8 10*3/uL — ABNORMAL HIGH (ref 4.0–10.5)
nRBC: 0 % (ref 0.0–0.2)

## 2019-11-25 LAB — BASIC METABOLIC PANEL
Anion gap: 8 (ref 5–15)
BUN: 11 mg/dL (ref 8–23)
CO2: 29 mmol/L (ref 22–32)
Calcium: 9.4 mg/dL (ref 8.9–10.3)
Chloride: 100 mmol/L (ref 98–111)
Creatinine, Ser: 0.8 mg/dL (ref 0.61–1.24)
GFR, Estimated: >60 mL/min (ref >60)
Glucose, Bld: 172 mg/dL — ABNORMAL HIGH (ref 70–99)
Potassium: 4.3 mmol/L (ref 3.5–5.1)
Sodium: 137 mmol/L (ref 135–145)

## 2019-11-25 LAB — TSH: TSH: 0.717 u[IU]/mL (ref 0.350–4.500)

## 2019-11-25 LAB — BRAIN NATRIURETIC PEPTIDE: B Natriuretic Peptide: 33 pg/mL (ref 0.0–100.0)

## 2019-11-25 LAB — TROPONIN I (HIGH SENSITIVITY): Troponin I (High Sensitivity): 5 ng/L (ref <18)

## 2019-11-25 NOTE — Assessment & Plan Note (Signed)
Quit smoking 2017  - 05/11/2019  After extensive coaching inhaler device,  effectiveness =    90% elipta so rec change to trelegy  - 05/11/2019   Walked on RA   300 ft -@ avg pace stopped due to sob with sats at end  93%  - 05/28/2019  After extensive coaching inhaler device,  effectiveness =    75% changed to breztri trial x 2 week samples -  05/28/2019   Walked RA  approx   300 ft  @ moderately fast pace  stopped due to  Sob/ sats still 90%   - 07/28/2019  After extensive coaching inhaler device,  effectiveness =    75% (sort ti) >continue breztri 2bid PFT's  07/29/19  FEV1 0.63 (24 % ) ratio 0.32  p 0 % improvement from saba p ? prior to study with DLCO  14.78 (63%) corrects to 5.03 (123%)  for alv volume and FV curve classic curvature  - 08/03/2019   continue breztri   - 08/03/19  Prednisone as maint 20 ceiling and 10 mg floor  -  08/03/2019   Walked RA  approx   300 ft  @ moderate pace  stopped due to  End of study no sob and sats 96% at end  - 10/15/2019  After extensive coaching inhaler device,  effectiveness =    75% (short ti but otherwise quite good )   Chest tightness likely due to air trapping    Group D in terms of symptom/risk and laba/lama/ICS  therefore appropriate rx at this point >>>  breztri and pred 10 mg 2 daily until better then one daily   I spent extra time with pt today reviewing appropriate use of albuterol for prn use on exertion with the following points: 1) saba is for relief of sob that does not improve by walking a slower pace or resting but rather if the pt does not improve after trying this first. 2) If the pt is convinced, as many are, that saba helps recover from activity faster then it's easy to tell if this is the case by re-challenging : ie stop, take the inhaler, then p 5 minutes try the exact same activity (intensity of workload) that just caused the symptoms and see if they are substantially diminished or not after saba 3) if there is an activity that reproducibly  causes the symptoms, try the saba 15 min before the activity on alternate days   If in fact the saba really does help, then fine to continue to use it prn but advised may need to look closer at the maintenance regimen being used to achieve better control of airways disease with exertion.   Unable to make any headway all with him in terms of outpt management and realized today he can't read at all, medical info or otherwise.   rec  2nd opinion as to how best to help him by either Dr Halford Chessman or Elsworth Soho but for now rec keep things as simple as possible.           Each maintenance medication was reviewed in detail including emphasizing most importantly the difference between maintenance and prns and under what circumstances the prns are to be triggered using an action plan format where appropriate.  Total time for H and P, chart review, counseling, teaching device and generating customized AVS unique to this acute office visit / charting > 30 min

## 2019-11-25 NOTE — Patient Instructions (Addendum)
Please remember to go to the lab and x-ray department at [facility]   for your tests - we will call you with the results when they are available.     No change in medications/ instructions    We need to have you evaluated by either Dr Halford Chessman or Elsworth Soho next available and I will see you here in meantime if needed  - bring your medications and inhalers.

## 2019-11-25 NOTE — Progress Notes (Signed)
[name], male    DOB: [birth year],  [name]: [id number]   Brief patient profile:  83 yobm quit smoking 2017  illiterate   grew up with dx of asthma but only started needing in saba in HS when started smoking and eventually placed on advair 500 and followed by Dr [name] and referred to pulmonary clinic in [facility]  05/11/2019 by [name] p Dr [name] retired     History of Present Illness  05/11/2019  Pulmonary/ 1st office eval/[name] on advair 500  Chief Complaint  Patient presents with   Pulmonary Consult    Referred by Ian Beach, NP. Former patient of Dr [name]. He states his breathing has been worse since he had first covid vaccine 03/03/19. He gets SOB when he wakes up in the am and starts moving around. He has cough with cream colored sputum. He is using his albuterol inhaler about every 6 hours and duonebs 2 x daily.   Dyspnea:  Can't walk a block = MMRC3 = can't walk 100 yards even at a slow pace at a flat grade s stopping due to sob   Cough: min white mucus  Sleep: bed is flat 2 pillows  SABA use: way too much as above 02 :  Only uses 02 p sits down  rec Stop wixela, atrovent (ipatropium solution) and combivent  Plan A = Automatic = Always=    Trelegy one click each am - take two good drags Plan B = Backup (to supplement plan A, not to replace it) Only use your albuterol inhaler (PROAIR)  as a rescue medication   Plan C = Crisis (instead of Plan B but only if Plan B stops working) - only use your albuterol nebulizer if you first try Plan B and it fails to help > ok to use the nebulizer up to every 4 hours but if start needing it regularly call for immediate appointment  Prednisone 10 mg take  4 each am x 2 days,   2 each am x 2 days,  1 each am x 2 days and stop  Make sure you check your oxygen saturations at highest level of activity (not after you stop!) to be sure it stays over 90% and adjust upward to maintain this level if needed but remember to turn it back to  previous settings when you stop (to conserve your supply).     05/28/2019  Acute  ov/[name] re: copd / moderna 2nd shot 3/24  - totally confused with meds  Chief Complaint  Patient presents with   Acute Visit    Pt states his breathing is not improving since the last visit 05/11/19- using proair 2 x daily until he just ran out. He also c/o cough with green sputum.   Dyspnea:  50 ft  - Pace Moderately  Fast from observation at ov  Cough: green x 2 months (said min white on 05/11/19 eval)   Sleeping: worse x 3 nights  SABA use: ran out of proair one day prior to ov/ very poor concept of maint vs prns and using trelegy sometimes twice daily  02: 02 prn but mostly uses p exertion/ does not monitor sats  rec Depomedrol 120 mg IM  Augmentin 875 mg take one pill twice daily  X 10 days - take at breakfast and supper with large glass of water. Change your inhalers: BREZTRI  AUTOMATICALLY =  take 2 puffs first thing in am and then another 2 puffs about 12 hours  later.  Only use your albuterol nebulizer  as a rescue medication   06/10/2019  f/u ov/[name] re: copd group D symptoms/ risk  Chief Complaint  Patient presents with   Follow-up    Breathing has improved some but not back to normal baseline. He is coughing less but still has some minimal green sputum.   Dyspnea:  Some better - [employer] walking now  Cough: much better but still some am green mucus  Sleeping: able to lie flat  SABA use: using neb in am/ still has full breztri so not using it correctly  02: only uses prn  rec We will call to schedule sinus and chest CT prior to next office visit  Plan A = Automatic = Always=    trelegy one click each am first thing and out thru nose   [name] is the same medication but Take 2 puffs first thing in am and then another 2 puffs about 12 hours later and out thru the nose  Plan B = Backup (to supplement plan A, not to replace it) Only use your albuterol inhaler PROAIR)  as a rescue medication   Plan  C = Crisis (instead of Plan B but only if Plan B stops working) - only use your albuterol nebulizer if you first try Plan B and it fails to help > ok to use the nebulizer up to every 4 hours but if start needing it regularly call for immediate appointment Please schedule a follow up office visit in 4 weeks, sooner if needed  with all medications /inhalers/ solutions   07/09/2019  f/u ov/[name] re: severe copd clinically  Chief Complaint  Patient presents with   Follow-up    Breathing had been doing better, but worse this am and he relates to rainy weather. He is using his albuterol inhaler 2 x per day and neb about 2 x per day.    Dyspnea: walking [employer] once  A week /  Cough: feels choked up / mucus green x months  Sleeping: able to sleep on 2 pillows / bed flat  SABA use: way too much   02: prn only  rec Plan A = Automatic = Always=    Stop trelegy and start Breztri Take 2 puffs first thing in am and then another 2 puffs about 12 hours later.  Work on inhaler technique:   Plan B = Backup (to supplement plan A, not to replace it) Only use your albuterol inhaler as a rescue medicatio  Plan C = Crisis (instead of Plan B but only if Plan B stops working) - only use your albuterol nebulizer if you first try Plan B and it fails to help > ok to use the nebulizer up to every 4 hours but if start needing it regularly call for immediate appointmentTry albuterol 15 min before (red inhaler one day, neb the next and nothing on the third day) an activity that you know would make you short of breath  Levaquin 500 mg daily x 10 days We will set you up for cxr and pfts in about 2 weeks same day and [name] and I will call you with results Please schedule a follow up office visit in 6 weeks, call sooner if needed    07/28/2019  Acute  ov/[name] office/[name] re: copd group D /illiterate Chief Complaint  Patient presents with   Acute Visit    [name] due to him not feeling well for the last  day. He has  been breathing harder than normal and that "his chest is stopping up" denies cough   Dyspnea:  Worse since mowed grass in rain 7/17 assoc sense of chest congestion and tightness  Cough: better since levaquin completed Sleeping: ok  SABA use: baseline bid hfa and nebulizer "once a day" - no insight at all into prn rx  02: only uses prn/ does not check sats    rec Plan A = Automatic = Always=    Breztri Take 2 puffs first thing in am and then another 2 puffs about 12 hours later.  Work on inhaler technique: Plan B = Backup (to supplement plan A, not to replace it) Only use your albuterol inhaler as a rescue medication Plan C = Crisis (instead of Plan B but only if Plan B stops working) - only use your albuterol nebulizer if you first try Plan B  keep appt for your PFTs at 9 am tomorrowPlease remember to go to the  x-ray department  for your tests - we will call you with the results when they are available  We need to see you at the end of next week with your inhalers (all of them) and your nebulizer solution - one sample of breztri given, says has two more at home (does not compute) as has not purchased any yet so his samples should have run out    08/03/2019  f/u ov/Tirsa Gail re: GOLD IV  breztri Chief Complaint  Patient presents with   Follow-up    no complaints  Dyspnea:  Better p prednisone from ER p went for pfts was more sob p saba   Cough: none  Sleeping: lie flat no 02  SABA use: mixed up between neb and hfa but only uses avg twice daily, does know now to use up to every 4 h prn  02: prn / not checking sats  rec No change on your inhalers  Prednisone 10 mg 2 daily when doing poorly, one a day thereafter    08/20/2019  f/u ov/[place] office/June Vacha re: GOLD IV Breztri Chief Complaint  Patient presents with   Follow-up  Dyspnea:  Able to shop at [place] now improved down to pred 10 mg daily   Cough: minimal am and at hs  Sleeping: flat in bed / 2 pillows SABA use:  still not understanding ABC 02: has it, not using  rec No change rx   10/15/2019  f/u ov/[place] office/Nazli Penn re: COPD GOLD IV/ breztri 2bid / illiterate/ still on pred 10 mg x 2 "until better" and says he's better but has not redced dose Chief Complaint  Patient presents with   Follow-up    non productive cough, shortnes of breath with activity  Dyspnea:  Shopping at [employer] slow pace = MMRC3 = can't walk 100 yards even at a slow pace at a flat grade s stopping due to sob   Cough: minimal dry Sleeping: flat bed /2 pillows  SABA use: avg one daily hfa/ one neb when overdoes it  02: not now using either hs or daytime  rec No change rx   11/25/2019  f/u ov/[place] office/Ayeshia Coppin re: chest tight  Chief Complaint  Patient presents with   Follow-up    feel tight in chest since took breztri this morning   Dyspnea:  Worse today so took 2 prednisone, has been on breztri 2bid but says got worse after am dose with chest tightness generailized, did not think to try his neb, rode moped instead to office and  requested to be seen  Cough: dry  Sleeping: ok  SABA use: about twice daily / neb not today/ very limited insight into how/ when to use meds  02: prn    No obvious day to day or daytime variability or assoc excess/ purulent sputum or mucus plugs or hemoptysis or  subjective wheeze or overt sinus or hb symptoms.    Sleeping ok without nocturnal  or early am exacerbation  of respiratory  c/o's or need for noct saba. Also denies any obvious fluctuation of symptoms with weather or environmental changes or other aggravating or alleviating factors except as outlined above   No unusual exposure hx or h/o childhood pna/ asthma or knowledge of premature birth.  Current Allergies, Complete Past Medical History, Past Surgical History, Family History, and Social History were reviewed in [employer] record.  ROS  The following are not active complaints unless  bolded Hoarseness, sore throat, dysphagia, dental problems, itching, sneezing,  nasal congestion or discharge of excess mucus or purulent secretions, ear ache,   fever, chills, sweats, unintended wt loss or wt gain, classically pleuritic or exertional cp,  orthopnea pnd or arm/hand swelling  or leg swelling, presyncope, palpitations, abdominal pain, anorexia, nausea, vomiting, diarrhea  or change in bowel habits or change in bladder habits, change in stools or change in urine, dysuria, hematuria,  rash, arthralgias, visual complaints, headache, numbness, weakness or ataxia or problems with walking or coordination,  change in mood or  memory.        Current Meds  - - NOTE:   Unable to verify as accurately reflecting what pt takes   (did not bring them as requested  Medication Sig   albuterol (PROAIR HFA) 108 (90 Base) MCG/ACT inhaler 2 puffs every 4 hours as needed only  if your can't catch your breath   albuterol (PROVENTIL) (2.5 MG/3ML) 0.083% nebulizer solution Take 3 mLs (2.5 mg total) by nebulization every 6 (six) hours as needed for wheezing or shortness of breath.   aspirin 81 MG tablet Take 81 mg by mouth daily.   Budeson-Glycopyrrol-Formoterol (BREZTRI AEROSPHERE) 160-9-4.8 MCG/ACT AERO Take 2 puffs first thing in am and then another 2 puffs about 12 hours later.   guaiFENesin (MUCINEX) 600 MG 12 hr tablet Take 600 mg by mouth 2 (two) times daily.    pravastatin (PRAVACHOL) 40 MG tablet TAKE (1) TABLET BY MOUTH AT BEDTIME.   predniSONE (DELTASONE) 10 MG tablet TAKE TWO TABLETS BY MOUTH DAILY UNTIL BETTER THEN 1 DAILY   triamterene-hydrochlorothiazide (MAXZIDE-25) 37.5-25 MG tablet Take 1.5 tablets by mouth daily.            Past Medical History:  Diagnosis Date   Asthma    CAP (community acquired pneumonia) 12/10/2018   COPD (chronic obstructive pulmonary disease) ([place])    COPD with acute exacerbation ([place]) 04/18/2018   CVA (cerebral vascular accident) ([place]) 2008   with  temporary vision loss    Depression    ECHOCARDIOGRAM, ABNORMAL 03/06/2010   Qualifier: Diagnosis of  By: Purcell Nails, NP, Kathryn     Elevated PSA 2014   no diagnosis pf prostate cancer in 07/2014   History of substance abuse ([place]) 01/14/2011   Marijuana   History of substance abuse ([place]) 01/14/2011   History of tobacco abuse 01/14/2011   Hyperlipidemia    Hypertension    Lobar pneumonia ([place]) 12/13/2018   Marijuana abuse    Nicotine addiction    Prediabetes 2014  Objective:    11/25/2019      124 10/15/2019        126  08/20/2019        130  08/03/2019        122  07/28/2019        121  07/09/2019          122   06/10/2019         122   05/28/19 118 lb 8 oz (53.8 kg)  05/11/19 122 lb (55.3 kg)  04/16/19 122 lb 12.8 oz (55.7 kg)    Vital signs reviewed  11/25/2019  - Note at rest 02 sats  96% on RA  Pulse 114          Report Edentulous   HEENT : pt wearing mask not removed for exam due to covid -19 concerns.    NECK :  without JVD/Nodes/TM/ nl carotid upstrokes bilaterally   LUNGS: no acc muscle use,  Mod barrel  contour chest wall with bilateral  Distant bs s audible wheeze and  without cough on insp or exp maneuvers and mod  Hyperresonant  to  percussion bilaterally     CV:  RRR  no s3 or murmur or increase in P2, and no edema   ABD:  soft and nontender with pos mid insp Hoover's  in the supine position. No bruits or organomegaly appreciated, bowel sounds nl  MS:     ext warm without deformities, calf tenderness, cyanosis or clubbing No obvious joint restrictions   SKIN: warm and dry without lesions    NEURO:  alert, approp, nl sensorium with  no motor or cerebellar deficits apparent.       ekg  11/25/2019   ST at 113, no ischemic changes   Labs ordered/ reviewed:      Chemistry      Component Value Date/Time   [name] 137 11/25/2019 1526   K 4.3 11/25/2019 1526   CL 100 11/25/2019 1526   CO2 29 11/25/2019 1526   BUN 11 11/25/2019 1526   CREATININE  0.80 11/25/2019 1526   CREATININE 1.00 12/18/2018 0950      Component Value Date/Time   CALCIUM 9.4 11/25/2019 1526   ALKPHOS 59 03/05/2019 1132   AST 23 03/05/2019 1132   ALT 17 03/05/2019 1132   BILITOT 0.4 03/05/2019 1132        Lab Results  Component Value Date   WBC 11.8 (H) 11/25/2019   HGB 13.4 11/25/2019   HCT 42.1 11/25/2019   MCV 97.7 11/25/2019   PLT 238 11/25/2019       EOS                                                              0                                         11/25/2019   Lab Results  Component Value Date   DDIMER 0.50 03/11/2019      Lab Results  Component Value Date   TSH 0.717 11/25/2019        BNP  11/25/2019   = 33   Trop  11/25/2019    = 5       CXR PA and Lateral:   11/25/2019 :    I personally reviewed images and  impression as follows:   C/w severe copd      Assessment

## 2019-11-26 DIAGNOSIS — J449 Chronic obstructive pulmonary disease, unspecified: Secondary | ICD-10-CM | POA: Diagnosis not present

## 2019-12-01 ENCOUNTER — Ambulatory Visit (INDEPENDENT_AMBULATORY_CARE_PROVIDER_SITE_OTHER): Payer: Medicare HMO | Admitting: Family Medicine

## 2019-12-01 ENCOUNTER — Other Ambulatory Visit: Payer: Self-pay

## 2019-12-01 ENCOUNTER — Encounter: Payer: Self-pay | Admitting: Family Medicine

## 2019-12-01 VITALS — BP 142/82 | Ht 67.0 in | Wt 124.0 lb

## 2019-12-01 DIAGNOSIS — Z0000 Encounter for general adult medical examination without abnormal findings: Secondary | ICD-10-CM

## 2019-12-01 NOTE — Patient Instructions (Addendum)
[name] , Thank you for taking time to come for your Medicare Wellness Visit. I appreciate your ongoing commitment to your health goals. Please review the following plan we discussed and let me know if I can assist you in the future.   Screening recommendations/referrals: Colonoscopy: 10/11/20 Recommended yearly ophthalmology/optometry visit for glaucoma screening and checkup Recommended yearly dental visit for hygiene and checkup  Vaccinations: Influenza vaccine: Fall 2022 Pneumococcal vaccine: Complete Tdap vaccine: 10/11/20 Shingles vaccine: Complete  Advanced directives: No  Conditions/risks identified: None  Next appointment: 12/10/19 @ 9 am  Preventive Care 65 Years and Older, Male Preventive care refers to lifestyle choices and visits with your health care provider that can promote health and wellness. What does preventive care include?  A yearly physical exam. This is also called an annual well check.  Dental exams once or twice a year.  Routine eye exams. Ask your health care provider how often you should have your eyes checked.  Personal lifestyle choices, including:  Daily care of your teeth and gums.  Regular physical activity.  Eating a healthy diet.  Avoiding tobacco and drug use.  Limiting alcohol use.  Practicing safe sex.  Taking low doses of aspirin every day.  Taking vitamin and mineral supplements as recommended by your health care provider. What happens during an annual well check? The services and screenings done by your health care provider during your annual well check will depend on your age, overall health, lifestyle risk factors, and family history of disease. Counseling  Your health care provider may ask you questions about your:  Alcohol use.  Tobacco use.  Drug use.  Emotional well-being.  Home and relationship well-being.  Sexual activity.  Eating habits.  History of falls.  Memory and ability to understand  (cognition).  Work and work Statistician. Screening  You may have the following tests or measurements:  Height, weight, and BMI.  Blood pressure.  Lipid and cholesterol levels. These may be checked every 5 years, or more frequently if you are over 75 years old.  Skin check.  Lung cancer screening. You may have this screening every year starting at age 58 if you have a 30-pack-year history of smoking and currently smoke or have quit within the past 15 years.  Fecal occult blood test (FOBT) of the stool. You may have this test every year starting at age 68.  Flexible sigmoidoscopy or colonoscopy. You may have a sigmoidoscopy every 5 years or a colonoscopy every 10 years starting at age 18.  Prostate cancer screening. Recommendations will vary depending on your family history and other risks.  Hepatitis C blood test.  Hepatitis B blood test.  Sexually transmitted disease (STD) testing.  Diabetes screening. This is done by checking your blood sugar (glucose) after you have not eaten for a while (fasting). You may have this done every 1-3 years.  Abdominal aortic aneurysm (AAA) screening. You may need this if you are a current or former smoker.  Osteoporosis. You may be screened starting at age 51 if you are at high risk. Talk with your health care provider about your test results, treatment options, and if necessary, the need for more tests. Vaccines  Your health care provider may recommend certain vaccines, such as:  Influenza vaccine. This is recommended every year.  Tetanus, diphtheria, and acellular pertussis (Tdap, Td) vaccine. You may need a Td booster every 10 years.  Zoster vaccine. You may need this after age 3.  Pneumococcal 13-valent conjugate (PCV13) vaccine.  One dose is recommended after age 40.  Pneumococcal polysaccharide (PPSV23) vaccine. One dose is recommended after age 31. Talk to your health care provider about which screenings and vaccines you need and  how often you need them. This information is not intended to replace advice given to you by your health care provider. Make sure you discuss any questions you have with your health care provider. Document Released: 01/20/2015 Document Revised: 09/13/2015 Document Reviewed: 10/25/2014 Elsevier Interactive Patient Education  2017 [place] Prevention in the Home Falls can cause injuries. They can happen to people of all ages. There are many things you can do to make your home safe and to help prevent falls. What can I do on the outside of my home?  Regularly fix the edges of walkways and driveways and fix any cracks.  Remove anything that might make you trip as you walk through a door, such as a raised step or threshold.  Trim any bushes or trees on the path to your home.  Use bright outdoor lighting.  Clear any walking paths of anything that might make someone trip, such as rocks or tools.  Regularly check to see if handrails are loose or broken. Make sure that both sides of any steps have handrails.  Any raised decks and porches should have guardrails on the edges.  Have any leaves, snow, or ice cleared regularly.  Use sand or salt on walking paths during winter.  Clean up any spills in your garage right away. This includes oil or grease spills. What can I do in the bathroom?  Use night lights.  Install grab bars by the toilet and in the tub and shower. Do not use towel bars as grab bars.  Use non-skid mats or decals in the tub or shower.  If you need to sit down in the shower, use a plastic, non-slip stool.  Keep the floor dry. Clean up any water that spills on the floor as soon as it happens.  Remove soap buildup in the tub or shower regularly.  Attach bath mats securely with double-sided non-slip rug tape.  Do not have throw rugs and other things on the floor that can make you trip. What can I do in the bedroom?  Use night lights.  Make sure that you  have a light by your bed that is easy to reach.  Do not use any sheets or blankets that are too big for your bed. They should not hang down onto the floor.  Have a firm chair that has side arms. You can use this for support while you get dressed.  Do not have throw rugs and other things on the floor that can make you trip. What can I do in the kitchen?  Clean up any spills right away.  Avoid walking on wet floors.  Keep items that you use a lot in easy-to-reach places.  If you need to reach something above you, use a strong step stool that has a grab bar.  Keep electrical cords out of the way.  Do not use floor polish or wax that makes floors slippery. If you must use wax, use non-skid floor wax.  Do not have throw rugs and other things on the floor that can make you trip. What can I do with my stairs?  Do not leave any items on the stairs.  Make sure that there are handrails on both sides of the stairs and use them. Fix handrails that are broken  or loose. Make sure that handrails are as long as the stairways.  Check any carpeting to make sure that it is firmly attached to the stairs. Fix any carpet that is loose or worn.  Avoid having throw rugs at the top or bottom of the stairs. If you do have throw rugs, attach them to the floor with carpet tape.  Make sure that you have a light switch at the top of the stairs and the bottom of the stairs. If you do not have them, ask someone to add them for you. What else can I do to help prevent falls?  Wear shoes that:  Do not have high heels.  Have rubber bottoms.  Are comfortable and fit you well.  Are closed at the toe. Do not wear sandals.  If you use a stepladder:  Make sure that it is fully opened. Do not climb a closed stepladder.  Make sure that both sides of the stepladder are locked into place.  Ask someone to hold it for you, if possible.  Clearly mark and make sure that you can see:  Any grab bars or  handrails.  First and last steps.  Where the edge of each step is.  Use tools that help you move around (mobility aids) if they are needed. These include:  Canes.  Walkers.  Scooters.  Crutches.  Turn on the lights when you go into a dark area. Replace any light bulbs as soon as they burn out.  Set up your furniture so you have a clear path. Avoid moving your furniture around.  If any of your floors are uneven, fix them.  If there are any pets around you, be aware of where they are.  Review your medicines with your doctor. Some medicines can make you feel dizzy. This can increase your chance of falling. Ask your doctor what other things that you can do to help prevent falls. This information is not intended to replace advice given to you by your health care provider. Make sure you discuss any questions you have with your health care provider. Document Released: 10/20/2008 Document Revised: 06/01/2015 Document Reviewed: 01/28/2014 Elsevier Interactive Patient Education  2017 [employer].

## 2019-12-01 NOTE — Progress Notes (Addendum)
Subjective:   [name] is a 69 y.o. male who presents for Medicare Annual/Subsequent preventive examination.  Participates include: Nurse patient provider  Method of visit: Telephone  Location of Patient: Home Location of Provider: Office Consent was obtain for visit over the telephone. Services rendered by provider: Visit was performed via telephone  I verified that I am speaking with the correct person using two identifiers.  Review of Systems Cardiac Risk Factors include: advanced age (>55men, >35 women)     Objective:    Today's Vitals   12/01/19 1429  Weight: 124 lb (56.2 kg)  Height: 5\' 7"  (1.702 m)  PainSc: 0-No pain   Body mass index is 19.42 kg/m.  Advanced Directives 12/01/2019 07/29/2019 05/26/2019 04/09/2019 03/26/2019 03/11/2019 12/10/2018  Does Patient Have a Medical Advance Directive? No No No No No No No  Would patient like information on creating a medical advance directive? No - Patient declined - No - Patient declined No - Patient declined No - Patient declined No - Patient declined No - Patient declined  Pre-existing out of facility DNR order (yellow form or pink MOST form) - - - - - - -    Current Medications (verified) Outpatient Encounter Medications as of 12/01/2019  Medication Sig  . albuterol (PROAIR HFA) 108 (90 Base) MCG/ACT inhaler 2 puffs every 4 hours as needed only  if your can't catch your breath  . albuterol (PROVENTIL) (2.5 MG/3ML) 0.083% nebulizer solution Take 3 mLs (2.5 mg total) by nebulization every 6 (six) hours as needed for wheezing or shortness of breath.  [name] Kitchen aspirin 81 MG tablet Take 81 mg by mouth daily.  . Budeson-Glycopyrrol-Formoterol (BREZTRI AEROSPHERE) 160-9-4.8 MCG/ACT AERO Take 2 puffs first thing in am and then another 2 puffs about 12 hours later.  [name] Kitchen guaiFENesin (MUCINEX) 600 MG 12 hr tablet Take 600 mg by mouth 2 (two) times daily.   . pravastatin (PRAVACHOL) 40 MG tablet TAKE (1) TABLET BY MOUTH AT BEDTIME.  .  predniSONE (DELTASONE) 10 MG tablet TAKE TWO TABLETS BY MOUTH DAILY UNTIL BETTER THEN 1 DAILY  . triamterene-hydrochlorothiazide (MAXZIDE-25) 37.5-25 MG tablet Take 1.5 tablets by mouth daily.  . [DISCONTINUED] simvastatin (ZOCOR) 40 MG tablet Take 40 mg by mouth daily.   No facility-administered encounter medications on file as of 12/01/2019.    Allergies (verified) Patient has no known allergies.   History: Past Medical History:  Diagnosis Date  . Asthma   . CAP (community acquired pneumonia) 12/10/2018  . COPD (chronic obstructive pulmonary disease) ([place])   . COPD with acute exacerbation ([place]) 04/18/2018  . CVA (cerebral vascular accident) ([place]) 2008   with temporary vision loss   . Depression   . ECHOCARDIOGRAM, ABNORMAL 03/06/2010   Qualifier: Diagnosis of  By: Purcell Nails, NP, Curt Bears    . Elevated PSA 2014   no diagnosis pf prostate cancer in 07/2014  . History of substance abuse ([place]) 01/14/2011   Marijuana  . History of substance abuse ([place]) 01/14/2011  . History of tobacco abuse 01/14/2011  . Hyperlipidemia   . Hypertension   . Lobar pneumonia ([place]) 12/13/2018  . Marijuana abuse   . Nicotine addiction   . Prediabetes 2014   No past surgical history on file. No family history on file. Social History   Socioeconomic History  . Marital status: Widowed    Spouse name: Not on file  . Number of children: 1  . Years of education: 11 the grade  . Highest education level:  11th grade  Occupational History  . Occupation: unemployed, retired  Tobacco Use  . Smoking status: Former Smoker    Packs/day: 0.50    Years: 40.00    Pack years: 20.00    Types: Cigarettes    Quit date: 04/14/2015    Years since quitting: 4.6  . Smokeless tobacco: Never Used  Vaping Use  . Vaping Use: Never used  Substance and Sexual Activity  . Alcohol use: Not Currently    Alcohol/week: 3.0 standard drinks    Types: 3 Cans of beer per week    Comment: occasionally  . Drug use: No    Comment: pt  quit 20 years ago  . Sexual activity: Not Currently  Other Topics Concern  . Not on file  Social History Narrative  . Not on file   Social Determinants of Health   Financial Resource Strain: Low Risk   . Difficulty of Paying Living Expenses: Not hard at all  Food Insecurity: No Food Insecurity  . Worried About Charity fundraiser in the Last Year: Never true  . Ran Out of Food in the Last Year: Never true  Transportation Needs: No Transportation Needs  . Lack of Transportation (Medical): No  . Lack of Transportation (Non-Medical): No  Physical Activity: Sufficiently Active  . Days of Exercise per Week: 7 days  . Minutes of Exercise per Session: 30 min  Stress: Stress Concern Present  . Feeling of Stress : Rather much  Social Connections: Socially Isolated  . Frequency of Communication with Friends and Family: More than three times a week  . Frequency of Social Gatherings with Friends and Family: Never  . Attends Religious Services: Never  . Active Member of Clubs or Organizations: No  . Attends Archivist Meetings: Never  . Marital Status: Widowed    Tobacco Counseling Counseling given: Yes   Clinical Intake:  Pre-visit preparation completed: Yes  Pain : No/denies pain Pain Score: 0-No pain     BMI - recorded: 19.48 Nutritional Status: BMI of 19-24  Normal Nutritional Risks: None Diabetes: No  How often do you need to have someone help you when you read instructions, pamphlets, or other written materials from your doctor or pharmacy?: 5 - Always What is the last grade level you completed in school?: 11  Diabetic? no  Interpreter Needed?: No  Information entered by :: Laretta Bolster, LPN   Activities of Daily Living In your present state of health, do you have any difficulty performing the following activities: 12/01/2019 05/26/2019  Hearing? N N  Vision? N N  Difficulty concentrating or making decisions? N N  Walking or climbing stairs? N N   Dressing or bathing? Y N  Doing errands, shopping? Y N  Preparing Food and eating ? Y N  Using the Toilet? Y N  In the past six months, have you accidently leaked urine? N N  Do you have problems with loss of bowel control? N N  Managing your Medications? Y N  Managing your Finances? Y N  Housekeeping or managing your Housekeeping? Y N  Some recent data might be hidden    Patient Care Team: Fayrene Helper, MD as PCP - General Irine Seal, MD as Attending Physician (Urology) Gala Romney Cristopher Estimable, MD as Consulting Physician (Gastroenterology) Danie Binder, MD (Inactive) as Consulting Physician (Gastroenterology) Barbaraann Faster, RN as [place] Management Jerelene Redden, Barbee Cough, NP as Nurse Practitioner (Family Medicine) Tanda Rockers, MD as Consulting  Physician (Pulmonary Disease)  Indicate any recent Medical Services you may have received from other than [facility] providers in the past year (date may be approximate).     Assessment:   This is a routine wellness examination for [name].  Hearing/Vision screen No exam data present  Dietary issues and exercise activities discussed: Current Exercise Habits: Home exercise routine, Type of exercise: walking, Time (Minutes): 30, Frequency (Times/Week): 7, Weekly Exercise (Minutes/Week): 210, Intensity: Mild  Goals    .  ([facility]) Find Help in My Community (pt-stated)      Follow Up Date 12/13/19   - follow-up on any referrals for help I am given - have a back-up plan    Why is this important?   Knowing how and where to find help for yourself or family in your neighborhood and community is an important skill.  You will want to take some steps to learn how.    Notes: 10/27/19 collaborated with [place] with outreach to lawn care providers-to follow up with any calls related to lawn or home care  Spoke with a male in the community and had lawn mowed on this week He wants this male to be his back up plan    .  [facility])  Learn More About My Health (pt-stated)      Follow Up Date 12/13/19   - ask questions - repeat what I heard to make sure I understand - bring a list of my medicines to the visit - speak up when I don't understand    Why is this important?   The best way to learn about your health and care is by talking to the doctor and nurse.  They will answer your questions and give you information in the way that you like best.    Notes: He reports he is understanding his action plan better and trying to ask questions     .  [facility]) Track and Manage My Triggers (pt-stated)      Follow Up Date 12/13/19     - identify and remove indoor air pollutants - limit outdoor activity during cold weather    Why is this important?   Triggers are activities or things, like tobacco smoke or cold weather, that make your COPD (chronic obstructive pulmonary disease) flare-up.  Knowing these triggers helps you plan how to stay away from them.  When you cannot remove them, you can learn how to manage them.     Notes: 11/10/19 He had his carpet removed  Continues to be as active as possible      .  Increase water intake      Patient would like to increase his water intake to at least 4 glasses a day.      Depression Screen PHQ 2/9 Scores 12/01/2019 12/01/2019 10/12/2019 09/15/2019 08/04/2019 07/09/2019 05/26/2019  PHQ - 2 Score 2 4 3  0 0 0 1  PHQ- 9 Score 5 5 10  - - - -  Exception Documentation - - - - - - -    Fall Risk Fall Risk  12/01/2019 11/10/2019 10/27/2019 10/13/2019 10/12/2019  Falls in the past year? 0 0 - 0 0  Number falls in past yr: 0 0 - 0 -  Injury with Fall? 0 0 - 0 -  Risk for fall due to : No Fall Risks - No Fall Risks - -  Follow up Falls evaluation completed Falls evaluation completed - Falls evaluation completed;Falls prevention discussed -    Any stairs  in or around the home? No  If so, are there any without handrails? n/a Home free of loose throw rugs in walkways, pet beds, electrical cords,  etc? Yes  Adequate lighting in your home to reduce risk of falls? Yes   ASSISTIVE DEVICES UTILIZED TO PREVENT FALLS:  Life alert? No  Use of a cane, walker or w/c? No  Grab bars in the bathroom? No  Shower chair or bench in shower? No  Elevated toilet seat or a handicapped toilet? No   TIMED UP AND GO:  Was the test performed? no    Cognitive Function: MMSE - Mini Mental State Exam 03/04/2016  Orientation to time 2  Orientation to Place 5  Registration 3  Attention/ Calculation 0  Attention/Calculation-comments unable to spell  Recall 3  Language- name 2 objects 2  Language- repeat 1  Language- follow 3 step command 3  Language- read & follow direction 0  Language-read & follow direction-comments unable to read and write  Write a sentence 1  Copy design 1  Total score 21     6CIT Screen 12/01/2019 11/30/2018 03/24/2018  What Year? 0 points 0 points 0 points  What month? 0 points 3 points 0 points  What time? 0 points 0 points 0 points  Count back from 20 0 points 0 points 2 points  Months in reverse 4 points 4 points 4 points  Repeat phrase 2 points 2 points 2 points  Total Score 6 9 8     Immunizations Immunization History  Administered Date(s) Administered  . Fluad Quad(high Dose 65+) 08/31/2018, 10/15/2019  . Influenza Split 10/23/2011  . Influenza Whole 10/02/2010  . Influenza,inj,Quad PF,6+ Mos 11/11/2012, 11/10/2013, 10/11/2014, 08/30/2015, 09/19/2016, 08/29/2017  . Moderna SARS-COVID-2 Vaccination 03/03/2019, 03/31/2019  . Pneumococcal Conjugate-13 03/28/2014  . Pneumococcal Polysaccharide-23 05/30/2010, 06/19/2015  . Td 09/22/2003  . Zoster Recombinat (Shingrix) 11/20/2018    TDAP status: Up to date Flu Vaccine status: Up to date Pneumococcal vaccine status: Up to date Covid-19 vaccine status: Completed vaccines  Qualifies for Shingles Vaccine? Yes   Zostavax completed No   Shingrix Completed?: Yes  Screening Tests Health Maintenance  Topic  Date Due  . COLONOSCOPY  10/11/2020 (Originally 07/14/2016)  . TETANUS/TDAP  10/11/2020 (Originally 09/21/2013)  . HEMOGLOBIN A1C  01/09/2020  . INFLUENZA VACCINE  Completed  . COVID-19 Vaccine  Completed  . Hepatitis C Screening  Completed  . PNA vac Low Risk Adult  Completed    Health Maintenance  There are no preventive care reminders to display for this patient.  Colorectal cancer screening: Completed 07/15/06. Repeat every 10 years  Lung Cancer Screening: (Low Dose CT Chest recommended if Age 48-80 years, 30 pack-year currently smoking OR have quit w/in 15years.) does not qualify.   Lung Cancer Screening Referral: n/a  Additional Screening:  Hepatitis C Screening: does not qualify  Vision Screening: Recommended annual ophthalmology exams for early detection of glaucoma and other disorders of the eye. Is the patient up to date with their annual eye exam?  No  Who is the provider or what is the name of the office in which the patient attends annual eye exams? Does not currently have one, declines referral at this time. If pt is not established with a provider, would they like to be referred to a provider to establish care? No .   Dental Screening: Recommended annual dental exams for proper oral hygiene  Community Resource Referral / Chronic Care Management: CRR required this visit?  No  CCM required this visit?  No      Plan:     1. Encounter for Medicare annual wellness exam   I have personally reviewed and noted the following in the patient's chart:   . Medical and social history . Use of alcohol, tobacco or illicit drugs  . Current medications and supplements . Functional ability and status . Nutritional status . Physical activity . Advanced directives . List of other physicians . Hospitalizations, surgeries, and ER visits in previous 12 months . Vitals . Screenings to include cognitive, depression, and falls . Referrals and appointments  In addition, I  have reviewed and discussed with patient certain preventive protocols, quality metrics, and best practice recommendations. A written personalized care plan for preventive services as well as general preventive health recommendations were provided to patient.     Perlie Mayo, NP   12/01/2019   Nurse Notes: AWV conducted in the office by phone with patient consent to the telehealth visit. Patient was at home at the time of the visit, provider in the office. Visit took 30 minutes to complete.  Agree with the above documentation

## 2019-12-10 ENCOUNTER — Ambulatory Visit: Payer: Medicare HMO | Admitting: Family Medicine

## 2019-12-13 ENCOUNTER — Ambulatory Visit (INDEPENDENT_AMBULATORY_CARE_PROVIDER_SITE_OTHER): Payer: Medicare HMO | Admitting: Internal Medicine

## 2019-12-13 ENCOUNTER — Encounter: Payer: Self-pay | Admitting: Internal Medicine

## 2019-12-13 ENCOUNTER — Other Ambulatory Visit: Payer: Self-pay | Admitting: *Deleted

## 2019-12-13 ENCOUNTER — Other Ambulatory Visit: Payer: Self-pay

## 2019-12-13 VITALS — BP 135/77 | HR 118 | Temp 98.6°F | Resp 18 | Ht 67.0 in | Wt 133.4 lb

## 2019-12-13 DIAGNOSIS — E785 Hyperlipidemia, unspecified: Secondary | ICD-10-CM

## 2019-12-13 DIAGNOSIS — R739 Hyperglycemia, unspecified: Secondary | ICD-10-CM | POA: Diagnosis not present

## 2019-12-13 DIAGNOSIS — I10 Essential (primary) hypertension: Secondary | ICD-10-CM | POA: Diagnosis not present

## 2019-12-13 DIAGNOSIS — Z0000 Encounter for general adult medical examination without abnormal findings: Secondary | ICD-10-CM | POA: Diagnosis not present

## 2019-12-13 DIAGNOSIS — J449 Chronic obstructive pulmonary disease, unspecified: Secondary | ICD-10-CM | POA: Diagnosis not present

## 2019-12-13 NOTE — Assessment & Plan Note (Signed)
On Pravastatin 

## 2019-12-13 NOTE — Progress Notes (Signed)
Established Patient Office Visit  Subjective:  Patient ID: [name], male    DOB: January 28, 1950  Age: 69 y.o. [name]: [id number]  CC:  Chief Complaint  Patient presents with  . Annual Exam    annual exam pt nose stays stuffy and he gives out of breath when he is doing activities     HPI [name] is a 69 year old male with PMH of COPD, asthma, HTN, HLD and hyperglycemia due to steroids who presents for annual physical exam.  Patient states that he has been in good health overall.  He complains of intermittent nasal stuffiness, but denies fever, chills, epistaxis.  He complains of dyspnea on exertion, which is chronic.  He follows up with Dr. Melvyn Novas for COPD.  He uses per history and albuterol nebs as well as albuterol inhaler as needed.  He is on chronic prednisone tablets for uncontrolled COPD.  BP is well-controlled. Takes medications regularly. Patient denies headache, dizziness, chest pain, dyspnea or palpitations.  Past Medical History:  Diagnosis Date  . Asthma   . CAP (community acquired pneumonia) 12/10/2018  . COPD (chronic obstructive pulmonary disease) ([place])   . COPD with acute exacerbation ([place]) 04/18/2018  . CVA (cerebral vascular accident) ([place]) 2008   with temporary vision loss   . Depression   . ECHOCARDIOGRAM, ABNORMAL 03/06/2010   Qualifier: Diagnosis of  By: Purcell Nails, NP, Curt Bears    . Elevated PSA 2014   no diagnosis pf prostate cancer in 07/2014  . History of substance abuse ([place]) 01/14/2011   Marijuana  . History of substance abuse ([place]) 01/14/2011  . History of tobacco abuse 01/14/2011  . Hyperlipidemia   . Hypertension   . Lobar pneumonia ([place]) 12/13/2018  . Marijuana abuse   . Nicotine addiction   . Prediabetes 2014    History reviewed. No pertinent surgical history.  History reviewed. No pertinent family history.  Social History   Socioeconomic History  . Marital status: Widowed    Spouse name: Not on file  . Number of children: 1  . Years of  education: 11 the grade  . Highest education level: 11th grade  Occupational History  . Occupation: unemployed, retired  Tobacco Use  . Smoking status: Former Smoker    Packs/day: 0.50    Years: 40.00    Pack years: 20.00    Types: Cigarettes    Quit date: 04/14/2015    Years since quitting: 4.6  . Smokeless tobacco: Never Used  Vaping Use  . Vaping Use: Never used  Substance and Sexual Activity  . Alcohol use: Not Currently    Alcohol/week: 3.0 standard drinks    Types: 3 Cans of beer per week    Comment: occasionally  . Drug use: No    Comment: pt quit 20 years ago  . Sexual activity: Not Currently  Other Topics Concern  . Not on file  Social History Narrative  . Not on file   Social Determinants of Health   Financial Resource Strain: Low Risk   . Difficulty of Paying Living Expenses: Not hard at all  Food Insecurity: No Food Insecurity  . Worried About Charity fundraiser in the Last Year: Never true  . Ran Out of Food in the Last Year: Never true  Transportation Needs: No Transportation Needs  . Lack of Transportation (Medical): No  . Lack of Transportation (Non-Medical): No  Physical Activity: Sufficiently Active  . Days of Exercise per Week: 7 days  . Minutes of  Exercise per Session: 30 min  Stress: Stress Concern Present  . Feeling of Stress : Rather much  Social Connections: Socially Isolated  . Frequency of Communication with Friends and Family: More than three times a week  . Frequency of Social Gatherings with Friends and Family: Never  . Attends Religious Services: Never  . Active Member of Clubs or Organizations: No  . Attends Archivist Meetings: Never  . Marital Status: Widowed  Intimate Partner Violence: Not At Risk  . Fear of Current or Ex-Partner: No  . Emotionally Abused: No  . Physically Abused: No  . Sexually Abused: No    Outpatient Medications Prior to Visit  Medication Sig Dispense Refill  . albuterol (PROAIR HFA) 108 (90  Base) MCG/ACT inhaler 2 puffs every 4 hours as needed only  if your can't catch your breath 18 g 11  . albuterol (PROVENTIL) (2.5 MG/3ML) 0.083% nebulizer solution Take 3 mLs (2.5 mg total) by nebulization every 6 (six) hours as needed for wheezing or shortness of breath. 150 mL 5  . aspirin 81 MG tablet Take 81 mg by mouth [name].    . Budeson-Glycopyrrol-Formoterol (BREZTRI AEROSPHERE) 160-9-4.8 MCG/ACT AERO Take 2 puffs first thing in am and then another 2 puffs about 12 hours later. 10.7 g 11  . guaiFENesin (MUCINEX) 600 MG 12 hr tablet Take 600 mg by mouth 2 (two) times [name].     . pravastatin (PRAVACHOL) 40 MG tablet TAKE (1) TABLET BY MOUTH AT BEDTIME. 90 tablet 1  . predniSONE (DELTASONE) 10 MG tablet TAKE TWO TABLETS BY MOUTH [name] UNTIL BETTER THEN 1 [name] 100 tablet 1  . triamterene-hydrochlorothiazide (MAXZIDE-25) 37.5-25 MG tablet Take 1.5 tablets by mouth [name]. 135 tablet 1   No facility-administered medications prior to visit.    No Known Allergies  ROS Review of Systems  Constitutional: Negative for chills and fever.  HENT: Negative for congestion and sore throat.   Eyes: Negative for pain and discharge.  Respiratory: Positive for cough and shortness of breath.   Cardiovascular: Negative for chest pain and palpitations.  Gastrointestinal: Negative for constipation, diarrhea, nausea and vomiting.  Endocrine: Negative for polydipsia and polyuria.  Genitourinary: Negative for dysuria and hematuria.  Musculoskeletal: Negative for neck pain and neck stiffness.  Skin: Negative for rash.  Neurological: Negative for dizziness, weakness, numbness and headaches.  Psychiatric/Behavioral: Negative for agitation and behavioral problems.      Objective:    Physical Exam Vitals reviewed.  Constitutional:      General: He is not in acute distress.    Appearance: He is not diaphoretic.  HENT:     Head: Normocephalic and atraumatic.     Nose: Nose normal.     Mouth/Throat:      Mouth: Mucous membranes are moist.  Eyes:     General: No scleral icterus.    Extraocular Movements: Extraocular movements intact.     Pupils: Pupils are equal, round, and reactive to light.  Neck:     Vascular: No carotid bruit.  Cardiovascular:     Rate and Rhythm: Normal rate and regular rhythm.     Pulses: Normal pulses.     Heart sounds: Normal heart sounds. No murmur heard.   Pulmonary:     Breath sounds: Wheezing (Mild, diffuse b/l) present. No rales.  Abdominal:     Palpations: Abdomen is soft.     Tenderness: There is no abdominal tenderness.  Musculoskeletal:     Cervical back: Neck supple. No tenderness.  Right lower leg: No edema.     Left lower leg: No edema.  Skin:    General: Skin is warm.     Findings: No rash.  Neurological:     General: No focal deficit present.     Mental Status: He is alert and oriented to person, place, and time.     Cranial Nerves: No cranial nerve deficit.     Sensory: No sensory deficit.  Psychiatric:        Mood and Affect: Mood normal.        Behavior: Behavior normal.     BP 135/77 (BP Location: Right Arm, Patient Position: Sitting)   Pulse (!) 118   Temp 98.6 F (37 C) (Oral)   Resp 18   Ht 5\' 7"  (1.702 m)   Wt 133 lb 6.4 oz (60.5 kg)   SpO2 93%   BMI 20.89 kg/m  Wt Readings from Last 3 Encounters:  12/13/19 133 lb 6.4 oz (60.5 kg)  12/01/19 124 lb (56.2 kg)  11/25/19 124 lb 6.4 oz (56.4 kg)     There are no preventive care reminders to display for this patient.  There are no preventive care reminders to display for this patient.  Lab Results  Component Value Date   TSH 0.717 11/25/2019   Lab Results  Component Value Date   WBC 11.8 (H) 11/25/2019   HGB 13.4 11/25/2019   HCT 42.1 11/25/2019   MCV 97.7 11/25/2019   PLT 238 11/25/2019   Lab Results  Component Value Date   NA 137 11/25/2019   K 4.3 11/25/2019   CO2 29 11/25/2019   GLUCOSE 172 (H) 11/25/2019   BUN 11 11/25/2019   CREATININE 0.80  11/25/2019   BILITOT 0.4 03/05/2019   ALKPHOS 59 03/05/2019   AST 23 03/05/2019   ALT 17 03/05/2019   PROT 7.0 03/05/2019   ALBUMIN 4.1 03/05/2019   CALCIUM 9.4 11/25/2019   ANIONGAP 8 11/25/2019   Lab Results  Component Value Date   CHOL 189 11/12/2018   Lab Results  Component Value Date   HDL 83 11/12/2018   Lab Results  Component Value Date   LDLCALC 93 11/12/2018   Lab Results  Component Value Date   TRIG 51 11/12/2018   Lab Results  Component Value Date   CHOLHDL 2.3 11/12/2018   Lab Results  Component Value Date   HGBA1C 6.0 (A) 07/09/2019   HGBA1C 6.0 07/09/2019   HGBA1C 6.0 07/09/2019   HGBA1C 6.0 07/09/2019      Assessment & Plan:   Problem List Items Addressed This Visit      Encounter for annual physical exam - Primary   Annual exam as documented. Counseling done  re healthy lifestyle involving commitment to 150 minutes exercise per week, heart healthy diet, and attaining healthy weight.The importance of adequate sleep also discussed. Changes in health habits are decided on by the patient with goals and time frames  set for achieving them. Immunization and cancer screening needs are specifically addressed at this visit.       Cardiovascular and Mediastinum   Essential hypertension    BP Readings from Last 1 Encounters:  12/13/19 135/77   Well-controlled with triamterene-HCTZ Counseled for compliance with the medications Advised DASH diet and moderate exercise/walking, at least 150 mins/week         Respiratory   COPD GOLD IV/ group D     Ex-smoker On [address] and albuterol nebs Albuterol inhaler as needed On  chronic oral steroids Follows up with Dr. Melvyn Novas referral        Other   Hyperlipidemia    On Pravastatin      Hyperglycemia    Likely due to steroids Last HbA1C 6.0 Will check HbA1C in the next visit              No orders of the defined types were placed in this encounter.   Follow-up: Return in about 4  months (around 04/12/2020).    Lindell Spar, MD

## 2019-12-13 NOTE — Patient Outreach (Signed)
[facility] [facility]) Care Management  12/13/2019  HAMEED KOLAR 1950-05-14 [id number]   [facility] outreach to complex care patient   Mr [name] was referred to [facility] on 03/24/19 from Dr Bari Mantis office staff Nelva Bush for Social work consult -pt with severe COPD has no family and lives alone needs to look for assisted living facility but has no transport or anyone to help with process   Insurance: [employer] 03/05/19 & 3/4/217/22/21[facility] visit forshortness of breath (sob),COPD exacerbation  Mr Sheriff has received [facility] services from [facility] RN CMs and SWs yearly since 2016. Last services received in December 2020   Patient is able to verify HIPAA, DOB and Address Reviewed and addressed reason for follow up/care coordination call to [facility] patient  Follow up [facility] RN CM reviewed the concerns in the initial March 2021 referral with Mr Aderhold  He verifies today he still wants to remain in his home and does not want to look for assisted living facility He worked with various [facility] SWs in 2021 and in May 2021 he informed [facility] SW he was not interested in further SW services He also has [facility] SW number if other services are needed  He confirms he still uses his moped for primary transportation without issues He confirms he still does not have anyone to assist him and has a lack of support but is managing  He reports remaining at home during the Thanksgiving holiday without visitors He states he watched tv and ate Denies food insecurities or socialization needs  He states he primary speak with telemarketers as he reports at times he gets 20-50 calls a day He was cautioned about providing confidential information to others he does not know He voiced understanding  Chronic obstructive pulmonary disease (COPD)  Started working with Dr Melvyn Novas this year related to his COPD management He reports being seen on 11/25/19 for chest tightness. He reports he reported symptoms to the  office as he was instructed to but felt he was "fussed at" He voices he is trying to be seen in the office to prevent having to be hospitalized. He notes he is scheduled to be seen by another pulmonologist in the office in January 2022 He voices "I will give it one more try"  He voices wanting to see an improvement with his symptoms or change in the COPD treatment plan. [facility] RN CM provided empathy and support. [facility] RN CM commended him for being willing to continue working with pulmonology and discussed the importance of having a pulmonologist for his COPD management He voiced understanding  He and [facility] RN CM discussed [facility] RN CM outreaching to pulmonology office to share his feelings  [facility] RN CM reviewed his COPD action plan to include taking his maintenance medication daily, prednisone, GERD (gastroesophageal reflux disease) medicine, using inhaler (Breztri, albuterol) and nebulizer (proventil) as needed, exercise and removing triggers in his home. He reports he does go out of his home at least once a day to walk in a store "wander around" as tolerated. Throughout this year he has removed old carpet from his home and is monitoring for dust. He does not have pets and denies mold and mildew. He used [employer]  [facility] RN CM outreached to Dr Melvyn Novas office [phone] to advocate for the patient related to him stating he was upset about how he feels he was "fussed at" when he followed up at the office with chest tightness  [facility] RN CM  advocated and spoke with Ronny Bacon about pt voiced feelings of being "fussed at:" at his 11/25/19 pulmonology appointment and willingness to attempt to work with Dr Halford Chessman starting in January 2022. Shared that He is not able to read or write primarily, does have difficulty understanding and learning about medical condition plus does not have confidence in completing forms independently This was noted   Plans [facility] RN CM will outreach to Mr Nicolosi as agreed within the next 30-45  business day Pt encouraged to return a call to [facility] RN CM prn  Goals      Patient Stated   .  [facility]) Find Help in My Community (pt-stated)      Follow Up Date 01/12/20   - follow-up on any referrals for help I am given - think ahead to make sure my need does not become an emergency - have a back-up plan      Notes:  10/27/19 collaborated with [place] with outreach to lawn care providers-to follow up with any calls related to lawn or home care  Spoke with a male in the community and had lawn mowed on this week He wants this male to be his back up plan    .  [facility]) Learn More About My Health (pt-stated)      Follow Up Date 01/12/20   - ask questions - repeat what I heard to make sure I understand - bring a list of my medicines to the visit - speak up when I don't understand    you like best.    Notes: He reports he is understanding his action plan better and trying to ask questions     .  [facility]) Track and Manage My Triggers (pt-stated)      Follow Up Date 01/12/20   - avoid second hand smoke - identify and remove indoor air pollutants - limit outdoor activity during cold weather - listen for public air quality announcements every day     Notes: 12/13/19 He continues to monitor his symptoms and triggers 11/10/19 He had his carpet removed  Continues to be as active as possible      Bee Hammerschmidt L. Lavina Hamman, RN, BSN, [place] Coordinator Office number [phone] Main [facility] number [phone] Fax number [phone]

## 2019-12-13 NOTE — Patient Instructions (Addendum)
Please continue taking medications as prescribed.  Please continue to follow up with Dr Melvyn Novas as scheduled.  If you have chest pain with palpitations or shortness of breath, go to ER for urgent medical evaluation.  Please take the booster dose of COVID vaccine as soon as possible.  Please use Nasal saline spray for nasal congestion/stuffiness. Try to use humidifier at home in winter months to help with dry air.

## 2019-12-13 NOTE — Assessment & Plan Note (Signed)
Annual exam as documented. Counseling done  re healthy lifestyle involving commitment to 150 minutes exercise per week, heart healthy diet, and attaining healthy weight.The importance of adequate sleep also discussed. Changes in health habits are decided on by the patient with goals and time frames  set for achieving them. Immunization and cancer screening needs are specifically addressed at this visit. 

## 2019-12-13 NOTE — Assessment & Plan Note (Signed)
Likely due to steroids Last HbA1C 6.0 Will check HbA1C in the next visit

## 2019-12-13 NOTE — Assessment & Plan Note (Signed)
BP Readings from Last 1 Encounters:  12/13/19 135/77   Well-controlled with triamterene-HCTZ Counseled for compliance with the medications Advised DASH diet and moderate exercise/walking, at least 150 mins/week

## 2019-12-13 NOTE — Assessment & Plan Note (Signed)
Ex-smoker On [employer] and albuterol nebs Albuterol inhaler as needed On chronic oral steroids Follows up with Dr. Melvyn Novas referral

## 2019-12-14 ENCOUNTER — Telehealth: Payer: Self-pay | Admitting: Pulmonary Disease

## 2019-12-14 NOTE — Telephone Encounter (Signed)
lmtcb for [place] for more information

## 2019-12-26 DIAGNOSIS — J449 Chronic obstructive pulmonary disease, unspecified: Secondary | ICD-10-CM | POA: Diagnosis not present

## 2019-12-27 NOTE — Telephone Encounter (Signed)
Phone number listed is for pt, not Joelene Millin. Pt has appt with Dr. Halford Chessman in 01/2020. Will close encounter.

## 2019-12-28 ENCOUNTER — Other Ambulatory Visit: Payer: Self-pay | Admitting: Internal Medicine

## 2020-01-10 ENCOUNTER — Ambulatory Visit: Payer: Medicare HMO | Admitting: Pulmonary Disease

## 2020-01-12 ENCOUNTER — Other Ambulatory Visit: Payer: Self-pay | Admitting: *Deleted

## 2020-01-12 ENCOUNTER — Other Ambulatory Visit: Payer: Self-pay

## 2020-01-12 NOTE — Patient Outreach (Signed)
[place] [facility]) Care Management  01/12/2020  QUADRI PERDOMO May 05, 1950 [id number]   [facility] outreach to complex care patient   Ian [name] was referred to [facility] on 03/24/19 from Dr Bari Mantis office staff Nelva Bush for Social work consult -pt with severe COPD has no family and lives alone needs to look for assisted living facility but has no transport or anyone to help with process   Insurance:[employer] 03/05/19 & 3/4/217/22/21[facility] visit forshortness of breath (sob),COPD exacerbation  [name] has received [facility] services from [facility] RN CMs and SWs yearly since 2016. Last services received in December 2020 He has chosen not to seek assisted living facility and remain in his home Continues to use his moped for transportation   Patient is able to verify HIPAA, DOB and Address Reviewed and addressed reason for follow up/care coordination call to [facility] patient  Follow up Denies food insecurities or socialization needs  He was encouraged again to seek socialization from the local senior center with covid precautions  Chronic obstructive pulmonary disease (COPD)  [name] reports he did well during the [place], uneventful He denies depression symptoms Reports he is coping well and is use to being by himself [name] reports his goal in 2022 is to collaborate with medical providers to keep his COPD under control and to breath better [name] reports he notices increased symptoms with his chest tightness only during the day (not at night) when he is active. He reports tightness in his chest mostly in the morning after taking his morning medicines and when he goes walking to get exercise as recommended by his pulmonologist. He feels one of his medicines and excessive exercise are triggers.  He reports he is fine as long as he rests  He reports he is willing to work with a new assigned pulmonologist His 01/10/2020 appointment was  rescheduled related to snow on 01/10/2020 He is to be seen on 02/07/20 He is interested in changing his medication and plan of care to get a better result in 2022. He voices he does not feel the 2021 treatment plan was effective as he continued with chest tightness.   He reports with [name] he was only getting prednisone prn not routinely He states this is the only difference he can recall in his 2019 and 2021 treatment plans [facility] RN CM discussed with him that COPD is not curable but manageable He voiced understanding [name] is not able to read well and has difficulty learning [facility] RN CM re-reviewed with him an overview of asthma, COPD and triggers He and [facility] RN CM have worked on decreasing his exposure to triggers in 2021 He has removed old carpet in his home, has not smoked "in ten years", does not have pets, denies allergens like mold in his home, etc  He feels his COPD is from untreated asthma  Plans Patient agrees to care plan and follow up [place] will outreach to [name] as agreed within the next 30-45 business day Pt encouraged to return a call to [facility] RN CM prn Goals Addressed              This Visit's Progress     Patient Stated   .  [facility]) Find Help in My Community (pt-stated)   On track     Follow Up Date 02/04/20   - follow-up on any referrals for help I am given - think ahead to make  sure my need does not become an emergency - have a back-up plan     Notes:  10/27/19 collaborated with [facility] RN CM with outreach to lawn care providers-to follow up with any calls related to lawn or home care  Spoke with a male in the community and had lawn mowed on this week He wants this male to be his back up plan    .  [facility]) Learn More About My Health (pt-stated)   On track     Follow Up Date 02/04/20   - ask questions - repeat what I heard to make sure I understand - bring a list of my medicines to the visit - speak up when I don't understand      Notes: He reports he is  understanding his action plan better and trying to ask questions     .  [facility]) Protect My Health (pt-stated)   On track     Timeframe:  Long-Range Goal Priority:  High Start Date:              01/12/20               Expected End Date:         12/522              Follow Up Date 12/11/20    - schedule appointment for vaccines needed due to my age or health - schedule recommended health tests (blood work, mammogram, colonoscopy, pap test) - schedule and keep appointment for annual check-up       Notes:     .  [facility]) Track and Manage My Triggers (pt-stated)   On track     Follow Up Date 02/04/20   - avoid second hand smoke - identify and remove indoor air pollutants - limit outdoor activity during cold weather - listen for public air quality announcements every day      Notes: 12/13/19 He continues to monitor his symptoms and triggers 11/10/19 He had his carpet removed  Continues to be as active as possible          Abagale Boulos L. Noelle Penner, RN, BSN, CCM [facility] Telephonic Care Management Care Coordinator Office number [phone] Main [facility] number [phone] Fax number [phone]

## 2020-01-17 ENCOUNTER — Other Ambulatory Visit: Payer: Self-pay | Admitting: Internal Medicine

## 2020-01-19 ENCOUNTER — Other Ambulatory Visit: Payer: Self-pay | Admitting: Internal Medicine

## 2020-01-26 DIAGNOSIS — J449 Chronic obstructive pulmonary disease, unspecified: Secondary | ICD-10-CM | POA: Diagnosis not present

## 2020-02-04 ENCOUNTER — Other Ambulatory Visit: Payer: Self-pay

## 2020-02-04 ENCOUNTER — Telehealth: Payer: Self-pay | Admitting: Pulmonary Disease

## 2020-02-04 ENCOUNTER — Other Ambulatory Visit: Payer: Self-pay | Admitting: *Deleted

## 2020-02-04 NOTE — Patient Outreach (Signed)
[place] [facility]) Care Management  02/04/2020  [name] 1950-09-02 [id number]   [facility] outreach to complex care patient   Mr [name] was referred to [facility] on 03/24/19 from Dr Bari Mantis office staff Nelva Bush for Social work consult -pt with severe COPD has no family and lives alone needs to look for assisted living facility but has no transport or anyone to help with process   Insurance:[employer] 03/05/19 & 3/4/217/22/21[facility] ED visit forshortness of breath (sob),COPD exacerbation  Mr [name] has received [facility] services from [facility] RN CMs and SWs yearly since 2016. Last services received in December 2020 He has chosen not to seek assisted living facility and remain in his home Continues to use his moped for transportation   Patient is able to verify HIPAA, DOB and Address Reviewed and addressed reason for follow up/care coordination call to [facility] patient  Follow up Denies food insecurities , home heating issues or socialization needs He has been out to get groceries and complete errands in preparation for today's pending inclement weather.     Chronic obstructive pulmonary disease (COPD) Mr [name] reports his 01/27/20 pulmonology appointment was rescheduled related to inclement weather He is scheduled to be seen on 02/07/20 Reports he is coping well and is use to being by himself Mr [name] reports his goal in 2022 is to collaborate with medical providers to keep his COPD under control and to breath better He denies any medical concerns today   Mr [name] was informed of the warm transfer process to [facility] now that he changed from [place] He voices understanding   Plans Patient agrees to care plan with warm transfer to [facility] staff  and follow up Pt encouraged to return a call to [place] CM prn  Joelene Millin L. Lavina Hamman, RN, BSN, [place] Coordinator Office number [phone] [facility] number  [phone] Fax number [phone]

## 2020-02-07 ENCOUNTER — Encounter: Payer: Self-pay | Admitting: Pulmonary Disease

## 2020-02-07 ENCOUNTER — Other Ambulatory Visit: Payer: Self-pay

## 2020-02-07 ENCOUNTER — Ambulatory Visit (INDEPENDENT_AMBULATORY_CARE_PROVIDER_SITE_OTHER): Payer: Medicare HMO | Admitting: Pulmonary Disease

## 2020-02-07 VITALS — BP 148/88 | HR 121 | Temp 97.1°F | Ht 67.0 in | Wt 131.2 lb

## 2020-02-07 DIAGNOSIS — J9611 Chronic respiratory failure with hypoxia: Secondary | ICD-10-CM | POA: Diagnosis not present

## 2020-02-07 DIAGNOSIS — J479 Bronchiectasis, uncomplicated: Secondary | ICD-10-CM | POA: Diagnosis not present

## 2020-02-07 DIAGNOSIS — J449 Chronic obstructive pulmonary disease, unspecified: Secondary | ICD-10-CM | POA: Diagnosis not present

## 2020-02-07 MED ORDER — PREDNISONE 5 MG PO TABS: 5.0000 mg | ORAL_TABLET | Freq: Every day | ORAL | 3 refills | Status: DC

## 2020-02-07 NOTE — Progress Notes (Signed)
[facility]  Chief Complaint  Patient presents with  . Follow-up    Shortness of breath with activity    Constitutional:  BP (!) 148/88 (BP Location: Left Arm, Cuff Size: Normal)   Pulse (!) 121   Temp (!) 97.1 F (36.2 C) (Other (Comment)) Comment (Src): wrist  Ht 5\' 7"  (1.702 m)   Wt 131 lb 3.2 oz (59.5 kg)   SpO2 95% Comment: Room air  BMI 20.55 kg/m   Past Medical History:  Pneumonia, CVA, Depression, HLD, HTN  Past Surgical History:  He  has no past surgical history on file.  Brief Summary:  [name] is a 70 y.o. male former cigarette and marijuana smoker with COPD with emphysema and bronchiectasis, and chronic hypoxic/hypercapnic respiratory failure.       Subjective:   Previously seen by Dr. Melvyn Novas.  Has intermittent cough with yellow sputum and wheeze.  Gets winded with activity.  Reports having home oxygen set up and uses 3 liters when he gets short of breath. He gets chest discomfort in the morning sometimes that improves after using albuterol.  Not having leg swelling.  Physical Exam:   Appearance - thin  ENMT - no sinus tenderness, no oral exudate, no LAN, Mallampati 3 airway, no stridor, edentulous  Respiratory - decreased breath sounds bilaterally, no wheezing or rales  CV - s1s2 regular rate and rhythm, no murmurs  Ext - no clubbing, no edema  Skin - no rashes  Psych - normal mood and affect   Pulmonary testing:   ABG on 28% FiO2 12/11/18 >> pH 7.385, PCO2 50.2, PO2 102  PFT 07/29/19 >> FEV1 0.63 (24%), FEV1% 32, TLC 9.24 (143%), DLCO 63%  Chest Imaging:   CT chest 07/05/19 >> mild centrilobular and paraseptal emphysema, BTX lower lobes, bulla RLL  Social History:  He  reports that he quit smoking about 4 years ago. His smoking use included cigarettes. He has a 20.00 pack-year smoking history. He has never used smokeless tobacco. He reports previous alcohol use of about 3.0 standard drinks of  alcohol per week. He reports that he does not use drugs.  Family History:  His family history is not on file.     Assessment/Plan:   Severe COPD with emphysema. - will change prednisone to 5 mg daily - continue breztri and prn albuterol - will arrange for spacer device to use with breztri and ventolin  Bronchiectasis. - likely from prior episodes of pneumonia - continue mucinex DM bid prn - will arrange for a flutter valve  Chronic hypoxic/hypercapnic respiratory failure. - he reports having home oxygen set up through ?[employer] and uses 3 liters with activity  - will try to arrange for him to get a portable pulse oximeter - explained goal SpO2 > 90%  Social determinants of health. - he has limited reading ability which impacts his ability to follow through with medical instructions  Time Spent Involved in Patient Care on Day of Examination:  32 minutes  Follow up:  Patient Instructions  Change prednisone to 5 mg pill daily  Breztri two puffs in the morning and two puffs in the evening, and rinse your mouth after each use  Will arrange for spacer device to use with breztri and ventolin inhalers  Will arrange for flutter valve and you can use this once or twice per day as needed when you have more chest congestion and phlegm  Mucinex DM once or twice per day  as needed when you have more chest congestion and phlegm  Follow up in 2 to 3 months   Medication List:   Allergies as of 02/07/2020   No Known Allergies     Medication List       Accurate as of February 07, 2020 11:28 AM. If you have any questions, ask your nurse or doctor.        acetaminophen 500 MG tablet Commonly known as: TYLENOL Take 500 mg by mouth every 6 (six) hours as needed. Takes 1 tablet "almost" every day   albuterol (2.5 MG/3ML) 0.083% nebulizer solution Commonly known as: PROVENTIL Take 3 mLs (2.5 mg total) by nebulization every 6 (six) hours as needed for wheezing or shortness of  breath.   albuterol 108 (90 Base) MCG/ACT inhaler Commonly known as: VENTOLIN HFA INHALE 2 PUFFS INTO THE LUNGS EVERY FOUR HOURS AS NEEDED. ONLY IF YOU CAN'T CATCH YOUR BREATH.   aspirin 325 MG EC tablet Take 325 mg by mouth daily. What changed: Another medication with the same name was removed. Continue taking this medication, and follow the directions you see here. Changed by: Chesley Mires, MD   Arnell Sieving [phone] MCG/ACT Aero Generic drug: Budeson-Glycopyrrol-Formoterol Take 2 puffs first thing in am and then another 2 puffs about 12 hours later.   guaiFENesin 600 MG 12 hr tablet Commonly known as: MUCINEX Take 600 mg by mouth 2 (two) times daily.   pravastatin 40 MG tablet Commonly known as: PRAVACHOL TAKE (1) TABLET BY MOUTH AT BEDTIME.   predniSONE 5 MG tablet Commonly known as: DELTASONE Take 1 tablet (5 mg total) by mouth daily with breakfast. What changed:   medication strength  how much to take  how to take this  when to take this  additional instructions Changed by: Chesley Mires, MD   triamterene-hydrochlorothiazide 37.5-25 MG tablet Commonly known as: MAXZIDE-25 Take 1.5 tablets by mouth daily.       Signature:  Chesley Mires, MD [place] Pager - [phone] 02/07/2020, 11:28 AM

## 2020-02-07 NOTE — Patient Instructions (Signed)
Change prednisone to 5 mg pill daily  Breztri two puffs in the morning and two puffs in the evening, and rinse your mouth after each use  Will arrange for spacer device to use with breztri and ventolin inhalers  Will arrange for flutter valve and you can use this once or twice per day as needed when you have more chest congestion and phlegm  Mucinex DM once or twice per day as needed when you have more chest congestion and phlegm  Follow up in 2 to 3 months

## 2020-02-08 MED ORDER — ALBUTEROL SULFATE HFA 108 (90 BASE) MCG/ACT IN AERS
INHALATION_SPRAY | RESPIRATORY_TRACT | 5 refills | Status: DC
Start: 2020-02-08 — End: 2020-05-22

## 2020-02-08 NOTE — Addendum Note (Signed)
Addended by: Merrilee Seashore on: 02/08/2020 09:57 AM   Modules accepted: Orders

## 2020-02-09 ENCOUNTER — Other Ambulatory Visit: Payer: Self-pay | Admitting: Internal Medicine

## 2020-02-09 MED ORDER — BREZTRI AEROSPHERE 160-9-4.8 MCG/ACT IN AERO
INHALATION_SPRAY | RESPIRATORY_TRACT | 11 refills | Status: DC
Start: 2020-02-09 — End: 2020-09-07

## 2020-02-15 NOTE — Telephone Encounter (Signed)
Nothing noted in message. Will close encounter.  

## 2020-02-24 ENCOUNTER — Encounter: Payer: Self-pay | Admitting: *Deleted

## 2020-02-24 ENCOUNTER — Telehealth: Payer: Self-pay | Admitting: *Deleted

## 2020-02-24 ENCOUNTER — Ambulatory Visit (INDEPENDENT_AMBULATORY_CARE_PROVIDER_SITE_OTHER): Payer: Medicare HMO | Admitting: *Deleted

## 2020-02-24 DIAGNOSIS — I10 Essential (primary) hypertension: Secondary | ICD-10-CM

## 2020-02-24 DIAGNOSIS — J449 Chronic obstructive pulmonary disease, unspecified: Secondary | ICD-10-CM

## 2020-02-24 NOTE — Telephone Encounter (Signed)
  Chronic Care Management   Outreach Note  02/24/2020 Name: [name] [name]: [id number] DOB: [birth year]  Referred by: Fayrene Helper, MD Reason for referral : Chronic Case Management (HTN, COPD/)   An unsuccessful telephone outreach was attempted today. The patient was referred to the case management team for assistance with care management and care coordination.     Follow Up Plan: Telephone follow up appointment with care management team member scheduled for: this week  Jacqlyn Larsen [facility], BSN RN Case Manager [facility] [phone]

## 2020-02-24 NOTE — Patient Instructions (Signed)
Visit Information  PATIENT GOALS:  Goals Addressed            This Visit's Progress   . Track and Manage My Blood Pressure-Hypertension       Timeframe:  Long-Range Goal Priority:  High Start Date:     02/24/2020                        Expected End Date:      08/23/2020                 Follow Up Date 03/23/2020   . Choose a place to take my blood pressure (home, clinic or office, retail store)  . Take all medications as prescribed . Follow a low salt diet- avoid fast food, fries, salty snacks such as chips etc.   Why is this important?    You won't feel high blood pressure, but it can still hurt your blood vessels.   High blood pressure can cause heart or kidney problems. It can also cause a stroke.   Making lifestyle changes like losing a little weight or eating less salt will help.   Checking your blood pressure at home and at different times of the day can help to control blood pressure.   If the doctor prescribes medicine remember to take it the way the doctor ordered.   Call the office if you cannot afford the medicine or if there are questions about it.     Notes:     . Track and Manage My Symptoms-COPD       Timeframe:  Long-Range Goal Priority:  High Start Date:      02/24/2020                       Expected End Date:   08/23/2020                    Follow Up Date 03/23/2020  . develop a rescue plan- continue to ask your friend/neighbor to help you as needed  . follow rescue plan if symptoms flare-up . call your doctor for any change in symptoms such as coughing more than usual, inhalers are not helping or overall not feeling well . Follow up with Dr. Halford Chessman (lung doctor) on 04/06/20 and Dr. Moshe Cipro on 04/12/20 . Read over articles mailed to you- "COPD diet" "Breathing Exercises"     Why is this important?    Tracking your symptoms and other information about your health helps your doctor plan your care.   Write down the symptoms, the time of day, what you were  doing and what medicine you are taking.   You will soon learn how to manage your symptoms.     Notes:        Consent to CCM Services: [name] was given information about Chronic Care Management services today including:  1. CCM service includes personalized support from designated clinical staff supervised by his physician, including individualized plan of care and coordination with other care providers 2. 24/7 contact phone numbers for assistance for urgent and routine care needs. 3. Service will only be billed when office clinical staff spend 20 minutes or more in a month to coordinate care. 4. Only one practitioner may furnish and bill the service in a calendar month. 5. The patient may stop CCM services at any time (effective at the end of the month) by phone call to the office staff. 6.  The patient will be responsible for cost sharing (co-pay) of up to 20% of the service fee (after annual deductible is met).  Patient agreed to services and verbal consent obtained.   The patient verbalized understanding of instructions, educational materials, and care plan provided today and agreed to receive a mailed copy of patient instructions, educational materials, and care plan.   Telephone follow up appointment with care management team member scheduled for:  03/23/2020  Jacqlyn Larsen [facility], BSN RN Case Manager [facility] [phone]  CLINICAL CARE PLAN: Patient Care Plan: COPD (Adult)    Problem Identified: Disease Progression (COPD)   Priority: High    Long-Range Goal: Disease Progression Minimized or Managed   Start Date: 02/24/2020  Expected End Date: 08/22/2020  This Visit's Progress: On track  Recent Progress: On track  Priority: High  Note:   Timeframe:  Long-Range Goal Priority:  High Start Date:      02/24/2020                       Expected End Date:       08/23/2020 Current Barriers:  [name] Kitchen Knowledge deficits related to basic understanding of COPD disease  process . Knowledge deficits related to basic COPD self care/management- needs education/ reinforcement for COPD action plan . Literacy barriers- patient has friend that reads his mail etc for him . Transportation barriers- patient rides moped to appointments, shopping, etc. . Limited Social Support- adult children live out of state, patient is widowed, has friend Audiological scientist) that helps him . Lacks social connections - patient does not go to church or other groups Case Manager Clinical Goal(s): [name] Kitchen Patient will verbalize understanding of COPD action plan and when to seek appropriate levels of medical care . Patient will verbalize basic understanding of COPD disease process and self care activities  Interventions:  . Collaboration with Fayrene Helper, MD regarding development and update of comprehensive plan of care as evidenced by provider attestation and co-signature . Inter-disciplinary care team collaboration (see longitudinal plan of care)  Provided patient with basic written and verbal COPD education on self care/management/and exacerbation prevention-EMMI education mailed "COPD diet"  "Breathing Exercises"  Provided patient with COPD action plan and reinforced importance of daily self assessment  Provided instruction about proper use of medications used for management of COPD including inhalers  Provided patient with education about the role of exercise in the management of COPD Patient Goals/Self-Care Activities:  . develop a rescue plan- continue to ask your friend/neighbor to help you as needed  . follow rescue plan if symptoms flare-up . call your doctor for any change in symptoms such as coughing more than usual, inhalers are not helping or overall not feeling well . Follow up with Dr. Halford Chessman (lung doctor) on 04/06/20 and Dr. Moshe Cipro on 04/12/20 . Read over articles mailed to you- "COPD diet" "Breathing Exercises" Follow Up Plan: Telephone follow up appointment with care management  team member scheduled for:  03/23/2020                                  Patient Care Plan: Hypertension (Adult)    Problem Identified: Disease Progression (Hypertension)   Priority: High    Long-Range Goal: Disease Progression Prevented or Minimized   Start Date: 02/24/2020  Expected End Date: 08/22/2020  This Visit's Progress: On track  Priority: High  Note:   Timeframe:  Long-Range Goal  Priority:  High Start Date:         02/24/2020                    Expected End Date:  08/23/2020  Objective:  . Last practice recorded BP readings:  BP Readings from Last 3 Encounters:  02/07/20 (!) 148/88  12/13/19 135/77  12/01/19 (!) 142/82 .   [name] Kitchen Most recent eGFR/CrCl: No results found for: EGFR  No components found for: CRCL Current Barriers:  [name] Kitchen Knowledge Deficits related to basic understanding of hypertension pathophysiology and self care management- patient does not check blood pressure at home, will continue to have checked at doctor's office or retail store . Literacy barriers- patient cannot read but has a friend (neighbor) who helps him read anything mailed to his home . Limited Social Support- adult children live out of state, patient is widowed, does not want to go to assisted living at this time (as previously considered)  . Lacks social connections- patient verbalizes places he could go such as church, Social research officer, government but due to [employer] and patient's respiratory issues has not been doing so Case Manager Clinical Goal(s): patient will verbalize understanding of plan for hypertension management  patient will attend all scheduled medical appointments: Dr. Moshe Cipro 04/12/20  Dr. Halford Chessman 04/06/20 Interventions:  . Collaboration with Fayrene Helper, MD regarding development and update of comprehensive plan of care as evidenced by provider attestation and co-signature . Inter-disciplinary care team collaboration (see longitudinal plan of care) . Evaluation of current treatment plan related to hypertension  self management and patient's adherence to plan as established by provider. . Provided education to patient re: stroke prevention, s/s of heart attack and stroke, DASH diet, complications of uncontrolled blood pressure . Reviewed medications with patient and discussed importance of compliance . Discussed plans with patient for ongoing care management follow up and provided patient with direct contact information for care management team . Reviewed scheduled/upcoming provider appointments including:  primary care provider and pulmonologist Patient Goals/Self-Care Activities Over the next 30 days patient will- . Self administers medications as prescribed . Attends all scheduled provider appointments . Calls provider office for new concerns, questions, or BP outside discussed parameters . Follows a low sodium diet/DASH diet . Choose a place to take my blood pressure (home, clinic or office, retail store)  . Take all medications as prescribed . Follow a low salt diet- avoid fast food, fries, salty snacks such as chips etc. Follow Up Plan: Telephone follow up appointment with care management team member scheduled for:        03/23/2020

## 2020-02-24 NOTE — Chronic Care Management (AMB) (Signed)
Chronic Care Management   CCM RN Visit Note  02/24/2020 Name: [name] [name]: [id number] DOB: [birth year]  Subjective: [name] is a 70 y.o. year old male who is a primary care patient of [name], MD. The care management team was consulted for assistance with disease management and care coordination needs.    Engaged with patient by telephone for initial visit in response to provider referral for case management and/or care coordination services.   Consent to Services:  The patient was given the following information about Chronic Care Management services today, agreed to services, and gave verbal consent: 1. CCM service includes personalized support from designated clinical staff supervised by the primary care provider, including individualized plan of care and coordination with other care providers 2. 24/7 contact phone numbers for assistance for urgent and routine care needs. 3. Service will only be billed when office clinical staff spend 20 minutes or more in a month to coordinate care. 4. Only one practitioner may furnish and bill the service in a calendar month. 5.The patient may stop CCM services at any time (effective at the end of the month) by phone call to the office staff. 6. The patient will be responsible for cost sharing (co-pay) of up to 20% of the service fee (after annual deductible is met). Patient agreed to services and consent obtained.  Patient agreed to services and verbal consent obtained.   Assessment: Review of patient past medical history, allergies, medications, health status, including review of consultants reports, laboratory and other test data, was performed as part of comprehensive evaluation and provision of chronic care management services.   SDOH (Social Determinants of Health) assessments and interventions performed:  SDOH Interventions   Flowsheet Row Most Recent Value  SDOH Interventions   Food Insecurity Interventions Intervention Not  Indicated  Financial Strain Interventions Intervention Not Indicated  Housing Interventions Intervention Not Indicated  Intimate Partner Violence Interventions Intervention Not Indicated  Physical Activity Interventions Intervention Not Indicated  Stress Interventions Intervention Not Indicated  Social Connections Interventions Intervention Not Indicated  Transportation Interventions Intervention Not Indicated       CCM Care Plan  No Known Allergies  Outpatient Encounter Medications as of 02/24/2020  Medication Sig  . acetaminophen (TYLENOL) 500 MG tablet Take 500 mg by mouth every 6 (six) hours as needed. Takes 1 tablet "almost" every day  . albuterol (PROVENTIL) (2.5 MG/3ML) 0.083% nebulizer solution Take 3 mLs (2.5 mg total) by nebulization every 6 (six) hours as needed for wheezing or shortness of breath.  [name] Kitchen albuterol (VENTOLIN HFA) 108 (90 Base) MCG/ACT inhaler INHALE 2 PUFFS INTO THE LUNGS EVERY FOUR HOURS AS NEEDED. ONLY IF YOU CAN'T CATCH YOUR BREATH.  [name] Kitchen aspirin 325 MG EC tablet Take 325 mg by mouth daily.  . Budeson-Glycopyrrol-Formoterol (BREZTRI AEROSPHERE) 160-9-4.8 MCG/ACT AERO Take 2 puffs first thing in am and then another 2 puffs about 12 hours later.  [name] Kitchen guaiFENesin (MUCINEX) 600 MG 12 hr tablet Take 600 mg by mouth 2 (two) times daily.   . pravastatin (PRAVACHOL) 40 MG tablet TAKE (1) TABLET BY MOUTH AT BEDTIME.  . predniSONE (DELTASONE) 5 MG tablet Take 1 tablet (5 mg total) by mouth daily with breakfast.  . triamterene-hydrochlorothiazide (MAXZIDE-25) 37.5-25 MG tablet Take 1.5 tablets by mouth daily.  . [DISCONTINUED] simvastatin (ZOCOR) 40 MG tablet Take 40 mg by mouth daily.   No facility-administered encounter medications on file as of 02/24/2020.    Patient Active Problem List   Diagnosis  Date Noted  . Encounter for annual physical exam 12/13/2019  . Screening for diabetes mellitus 07/09/2019  . Rhinitis, chronic 06/10/2019  . Pulmonary infiltrates on CXR  05/29/2019  . COPD GOLD IV/ group D  05/11/2019  . Low oxygen saturation 03/23/2019  . Food insecurity 11/22/2018  . DOE (dyspnea on exertion) 04/18/2018  . Glucose intolerance 04/18/2018  . Social isolation 03/17/2013  . Asthma with COPD (chronic obstructive pulmonary disease) ([place]) 11/12/2012  . Hyperglycemia 01/14/2011  . Tachycardia 11/05/2010  . Vitamin D deficiency 06/28/2009  . IGT (impaired glucose tolerance) 01/25/2009  . PSA, INCREASED 01/25/2009  . Hyperlipidemia 07/30/2007  . Essential hypertension 07/30/2007    Conditions to be addressed/monitored:HTN and COPD  Care Plan : COPD (Adult)  Updates made by Kassie Mends, RN since 02/24/2020 12:00 AM    Problem: Disease Progression (COPD)   Priority: High    Long-Range Goal: Disease Progression Minimized or Managed   Start Date: 02/24/2020  Expected End Date: 08/22/2020  This Visit's Progress: On track  Recent Progress: On track  Priority: High  Note:   Timeframe:  Long-Range Goal Priority:  High Start Date:      02/24/2020                       Expected End Date:       08/23/2020 Current Barriers:  [name] Kitchen Knowledge deficits related to basic understanding of COPD disease process . Knowledge deficits related to basic COPD self care/management- needs education/ reinforcement for COPD action plan . Literacy barriers- patient has friend that reads his mail etc for him . Transportation barriers- patient rides moped to appointments, shopping, etc. . Limited Social Support- adult children live out of state, patient is widowed, has friend Audiological scientist) that helps him . Lacks social connections - patient does not go to church or other groups Case Manager Clinical Goal(s): [name] Kitchen Patient will verbalize understanding of COPD action plan and when to seek appropriate levels of medical care . Patient will verbalize basic understanding of COPD disease process and self care activities  Interventions:  . Collaboration with Fayrene Helper,  MD regarding development and update of comprehensive plan of care as evidenced by provider attestation and co-signature . Inter-disciplinary care team collaboration (see longitudinal plan of care)  Provided patient with basic written and verbal COPD education on self care/management/and exacerbation prevention-EMMI education mailed "COPD diet"  "Breathing Exercises"  Provided patient with COPD action plan and reinforced importance of daily self assessment  Provided instruction about proper use of medications used for management of COPD including inhalers  Provided patient with education about the role of exercise in the management of COPD Patient Goals/Self-Care Activities:  . develop a rescue plan- continue to ask your friend/neighbor to help you as needed  . follow rescue plan if symptoms flare-up . call your doctor for any change in symptoms such as coughing more than usual, inhalers are not helping or overall not feeling well . Follow up with Dr. Halford Chessman (lung doctor) on 04/06/20 and Dr. Moshe Cipro on 04/12/20 . Read over articles mailed to you- "COPD diet" "Breathing Exercises" Follow Up Plan: Telephone follow up appointment with care management team member scheduled for:  03/23/2020                              Care Plan : Hypertension (Adult)  Updates made by Kassie Mends, RN since 02/24/2020 12:00 AM  Problem: Disease Progression (Hypertension)   Priority: High    Long-Range Goal: Disease Progression Prevented or Minimized   Start Date: 02/24/2020  Expected End Date: 08/22/2020  This Visit's Progress: On track  Priority: High  Note:   Timeframe:  Long-Range Goal Priority:  High Start Date:         02/24/2020                    Expected End Date:  08/23/2020  Objective:  . Last practice recorded BP readings:  BP Readings from Last 3 Encounters:  02/07/20 (!) 148/88  12/13/19 135/77  12/01/19 (!) 142/82 .   [name] Kitchen Most recent eGFR/CrCl: No results found for: EGFR  No components found  for: CRCL Current Barriers:  [name] Kitchen Knowledge Deficits related to basic understanding of hypertension pathophysiology and self care management- patient does not check blood pressure at home, will continue to have checked at doctor's office or retail store . Literacy barriers- patient cannot read but has a friend (neighbor) who helps him read anything mailed to his home . Limited Social Support- adult children live out of state, patient is widowed, does not want to go to assisted living at this time (as previously considered)  . Lacks social connections- patient verbalizes places he could go such as church, Social research officer, government but due to [employer] and patient's respiratory issues has not been doing so Case Manager Clinical Goal(s): patient will verbalize understanding of plan for hypertension management  patient will attend all scheduled medical appointments: Dr. Moshe Cipro 04/12/20  Dr. Halford Chessman 04/06/20 Interventions:  . Collaboration with Fayrene Helper, MD regarding development and update of comprehensive plan of care as evidenced by provider attestation and co-signature . Inter-disciplinary care team collaboration (see longitudinal plan of care) . Evaluation of current treatment plan related to hypertension self management and patient's adherence to plan as established by provider. . Provided education to patient re: stroke prevention, s/s of heart attack and stroke, DASH diet, complications of uncontrolled blood pressure . Reviewed medications with patient and discussed importance of compliance . Discussed plans with patient for ongoing care management follow up and provided patient with direct contact information for care management team . Reviewed scheduled/upcoming provider appointments including:  primary care provider and pulmonologist Patient Goals/Self-Care Activities Over the next 30 days patient will- . Self administers medications as prescribed . Attends all scheduled provider appointments . Calls provider  office for new concerns, questions, or BP outside discussed parameters . Follows a low sodium diet/DASH diet . Choose a place to take my blood pressure (home, clinic or office, retail store)  . Take all medications as prescribed . Follow a low salt diet- avoid fast food, fries, salty snacks such as chips etc. Follow Up Plan: Telephone follow up appointment with care management team member scheduled for:        03/23/2020                  Plan:Telephone follow up appointment with care management team member scheduled for:  03/23/2020  Jacqlyn Larsen [facility], BSN RN Case Manager [facility] [phone]

## 2020-02-26 DIAGNOSIS — J449 Chronic obstructive pulmonary disease, unspecified: Secondary | ICD-10-CM | POA: Diagnosis not present

## 2020-02-28 ENCOUNTER — Ambulatory Visit: Payer: Self-pay

## 2020-03-13 ENCOUNTER — Other Ambulatory Visit: Payer: Self-pay | Admitting: Internal Medicine

## 2020-03-13 MED ORDER — PREDNISONE 5 MG PO TABS
5.0000 mg | ORAL_TABLET | Freq: Every day | ORAL | 3 refills | Status: DC
Start: 2020-03-13 — End: 2020-06-30

## 2020-03-23 ENCOUNTER — Ambulatory Visit (INDEPENDENT_AMBULATORY_CARE_PROVIDER_SITE_OTHER): Payer: Medicare HMO | Admitting: *Deleted

## 2020-03-23 DIAGNOSIS — I10 Essential (primary) hypertension: Secondary | ICD-10-CM

## 2020-03-23 DIAGNOSIS — J449 Chronic obstructive pulmonary disease, unspecified: Secondary | ICD-10-CM

## 2020-03-23 NOTE — Chronic Care Management (AMB) (Signed)
Chronic Care Management   CCM RN Visit Note  03/23/2020 Name: [name] [name]: [id number] DOB: [birth year]  Subjective: [name] is a 70 y.o. year old male who is a primary care patient of [name], MD. The care management team was consulted for assistance with disease management and care coordination needs.    Engaged with patient by telephone for follow up visit in response to provider referral for case management and/or care coordination services.   Consent to Services:  The patient was given information about Chronic Care Management services, agreed to services, and gave verbal consent prior to initiation of services.  Please see initial visit note for detailed documentation.   Patient agreed to services and verbal consent obtained.   Assessment: Review of patient past medical history, allergies, medications, health status, including review of consultants reports, laboratory and other test data, was performed as part of comprehensive evaluation and provision of chronic care management services.   SDOH (Social Determinants of Health) assessments and interventions performed:    CCM Care Plan  No Known Allergies  Outpatient Encounter Medications as of 03/23/2020  Medication Sig  . acetaminophen (TYLENOL) 500 MG tablet Take 500 mg by mouth every 6 (six) hours as needed. Takes 1 tablet "almost" every day  . albuterol (PROVENTIL) (2.5 MG/3ML) 0.083% nebulizer solution Take 3 mLs (2.5 mg total) by nebulization every 6 (six) hours as needed for wheezing or shortness of breath.  [name] Kitchen albuterol (VENTOLIN HFA) 108 (90 Base) MCG/ACT inhaler INHALE 2 PUFFS INTO THE LUNGS EVERY FOUR HOURS AS NEEDED. ONLY IF YOU CAN'T CATCH YOUR BREATH.  [name] Kitchen aspirin 325 MG EC tablet Take 325 mg by mouth daily.  . Budeson-Glycopyrrol-Formoterol (BREZTRI AEROSPHERE) 160-9-4.8 MCG/ACT AERO Take 2 puffs first thing in am and then another 2 puffs about 12 hours later.  [name] Kitchen guaiFENesin (MUCINEX) 600 MG 12 hr  tablet Take 600 mg by mouth 2 (two) times daily.   . pravastatin (PRAVACHOL) 40 MG tablet TAKE (1) TABLET BY MOUTH AT BEDTIME.  . predniSONE (DELTASONE) 5 MG tablet Take 1 tablet (5 mg total) by mouth daily with breakfast.  . triamterene-hydrochlorothiazide (MAXZIDE-25) 37.5-25 MG tablet Take 1.5 tablets by mouth daily.  . [DISCONTINUED] simvastatin (ZOCOR) 40 MG tablet Take 40 mg by mouth daily.   No facility-administered encounter medications on file as of 03/23/2020.    Patient Active Problem List   Diagnosis Date Noted  . Encounter for annual physical exam 12/13/2019  . Screening for diabetes mellitus 07/09/2019  . Rhinitis, chronic 06/10/2019  . Pulmonary infiltrates on CXR 05/29/2019  . COPD GOLD IV/ group D  05/11/2019  . Low oxygen saturation 03/23/2019  . Food insecurity 11/22/2018  . DOE (dyspnea on exertion) 04/18/2018  . Glucose intolerance 04/18/2018  . Social isolation 03/17/2013  . Asthma with COPD (chronic obstructive pulmonary disease) ([place]) 11/12/2012  . Hyperglycemia 01/14/2011  . Tachycardia 11/05/2010  . Vitamin D deficiency 06/28/2009  . IGT (impaired glucose tolerance) 01/25/2009  . PSA, INCREASED 01/25/2009  . Hyperlipidemia 07/30/2007  . Essential hypertension 07/30/2007    Conditions to be addressed/monitored:HTN and COPD  Care Plan : COPD (Adult)  Updates made by Kassie Mends, RN since 03/23/2020 12:00 AM    Problem: Disease Progression (COPD)   Priority: High    Long-Range Goal: Disease Progression Minimized or Managed   Start Date: 02/24/2020  Expected End Date: 08/22/2020  Recent Progress: On track  Priority: High  Note:   Timeframe:  Long-Range Goal  Priority:  High Start Date:      02/24/2020                       Expected End Date:       08/23/2020 Current Barriers:  [name] Kitchen Knowledge deficits related to basic understanding of COPD disease process . Knowledge deficits related to basic COPD self care/management- continues to need education/  reinforcement for COPD action plan . Literacy barriers- patient has friend that reads his mail etc for him . Transportation barriers- patient rides moped to appointments, shopping, etc. . Limited Social Support- adult children live out of state, patient is widowed, has friend Audiological scientist) that helps him . Lacks social connections - patient does not go to church or other groups Case Manager Clinical Goal(s): [name] Kitchen Patient will verbalize understanding of COPD action plan and when to seek appropriate levels of medical care . Patient will verbalize basic understanding of COPD disease process and self care activities  Interventions:  . Collaboration with Fayrene Helper, MD regarding development and update of comprehensive plan of care as evidenced by provider attestation and co-signature . Inter-disciplinary care team collaboration (see longitudinal plan of care)  Provided patient with basic written and verbal COPD education on self care/management/and exacerbation prevention  Reinforced COPD action plan and importance of daily self assessment and calling doctor promptly as needed  Reinforced instruction about proper use of medications used for management of COPD including inhalers  Reviewed importance of the role of exercise in the management of COPD  Reviewed triggers for COPD exacerbation such as environmental stimulants (pollen, smoke, etc) and to avoid Patient Goals/Self-Care Activities:  . develop a rescue plan- continue to ask your friend/neighbor to help you as needed  . follow rescue plan if symptoms flare-up . call your doctor for any change in symptoms such as coughing more than usual, inhalers are not helping or overall not feeling well . Follow up with Dr. Halford Chessman (lung doctor) on 04/06/20 and Dr. Moshe Cipro on 04/12/20 . Practice deep breathing exercises . Pollen counts will be high in the Spring, wear a mask as needed   . Follow Up Plan:  by telephone 05/04/2020                            Care Plan : Hypertension (Adult)  Updates made by Kassie Mends, RN since 03/23/2020 12:00 AM    Problem: Disease Progression (Hypertension)   Priority: High    Long-Range Goal: Disease Progression Prevented or Minimized   Start Date: 02/24/2020  Expected End Date: 08/22/2020  Recent Progress: On track  Priority: High  Note:   Timeframe:  Long-Range Goal Priority:  High Start Date:         02/24/2020                    Expected End Date:  08/23/2020  Objective:  . Last practice recorded BP readings:  BP Readings from Last 3 Encounters:  02/07/20 (!) 148/88  12/13/19 135/77  12/01/19 (!) 142/82 .   [name] Kitchen Most recent eGFR/CrCl: No results found for: EGFR  No components found for: CRCL Current Barriers:  [name] Kitchen Knowledge Deficits related to basic understanding of hypertension pathophysiology and self care management- patient does not check blood pressure at home, will continue to have checked at doctor's office or retail store . Literacy barriers- patient cannot read but has a friend (neighbor) who helps him read  anything mailed to his home . Limited Social Support- adult children live out of state, patient is widowed, does not want to go to assisted living at this time (as previously considered)  . Lacks social connections- patient verbalizes places he could go such as church, Social research officer, government but due to [employer] and patient's respiratory issues has not been doing so Case Manager Clinical Goal(s): patient will verbalize understanding of plan for hypertension management  patient will attend all scheduled medical appointments: Dr. Moshe Cipro 04/12/20  Dr. Halford Chessman 04/06/20 Interventions:  . Collaboration with Fayrene Helper, MD regarding development and update of comprehensive plan of care as evidenced by provider attestation and co-signature . Inter-disciplinary care team collaboration (see longitudinal plan of care) . Evaluation of current treatment plan related to hypertension self management and patient's  adherence to plan as established by provider. . Provided education to patient re: stroke prevention, s/s of heart attack and stroke, DASH diet, complications of uncontrolled blood pressure . Reviewed choosing fresh and frozen foods if possible . Reviewed importance of taking medications as prescribed . Discussed plans with patient for ongoing care management follow up and provided patient with direct contact information for care management team . Reviewed scheduled/upcoming provider appointments including:  primary care provider and pulmonologist, reviewed dates with patient and he does have transportation Patient Goals/Self-Care Activities Over the next 30 days patient will- . Self administers medications as prescribed . Attends all scheduled provider appointments . Calls provider office for new concerns, questions, or BP outside discussed parameters . Follows a low sodium diet/DASH diet . Choose a place to take my blood pressure (home, clinic or office, retail store)  . Continue to take all medications as prescribed . Follow a low salt diet- avoid fast food, fries, salty snacks such as chips etc. . Ask your friend to look at food labels for you to see salt content . Choose fresh or frozen foods as much as you can Follow Up Plan: Telephone follow up appointment with care management team member scheduled for:        05/04/2020               Plan:Telephone follow up appointment with care management team member scheduled for:  05/04/2020   Jacqlyn Larsen [facility], BSN RN Case Manager [facility] [phone]

## 2020-03-23 NOTE — Patient Instructions (Signed)
Visit Information  PATIENT GOALS: Goals Addressed            This Visit's Progress   . Track and Manage My Blood Pressure-Hypertension       Timeframe:  Long-Range Goal Priority:  High Start Date:     02/24/2020                        Expected End Date:      08/23/2020                 Follow Up Date 05/04/2020   . Choose a place to take my blood pressure (home, clinic or office, retail store)  . Continue to take all medications as prescribed . Follow a low salt diet- avoid fast food, fries, salty snacks such as chips etc. . Ask your friend to look at food labels for you to see salt content . Choose fresh or frozen foods as much as you can   Why is this important?    You won't feel high blood pressure, but it can still hurt your blood vessels.   High blood pressure can cause heart or kidney problems. It can also cause a stroke.   Making lifestyle changes like losing a little weight or eating less salt will help.   Checking your blood pressure at home and at different times of the day can help to control blood pressure.   If the doctor prescribes medicine remember to take it the way the doctor ordered.   Call the office if you cannot afford the medicine or if there are questions about it.     Notes:     . Track and Manage My Symptoms-COPD       Timeframe:  Long-Range Goal Priority:  High Start Date:      02/24/2020                       Expected End Date:   08/23/2020                    Follow Up Date 03/23/2020  . develop a rescue plan- continue to ask your friend/neighbor to help you as needed  . follow rescue plan if symptoms flare-up . call your doctor for any change in symptoms such as coughing more than usual, inhalers are not helping or overall not feeling well . Follow up with Dr. Halford Chessman (lung doctor) on 04/06/20 and Dr. Moshe Cipro on 04/12/20 . Practice deep breathing exercises . Pollen counts will be high in the Spring, wear a mask as needed     Why is this important?     Tracking your symptoms and other information about your health helps your doctor plan your care.   Write down the symptoms, the time of day, what you were doing and what medicine you are taking.   You will soon learn how to manage your symptoms.     Notes:        The patient verbalized understanding of instructions, educational materials, and care plan provided today and declined offer to receive copy of patient instructions, educational materials, and care plan.   Telephone follow up appointment with care management team member scheduled for:  05/04/2020  Jacqlyn Larsen [facility], BSN RN Case Manager [facility] [phone]

## 2020-03-25 DIAGNOSIS — J449 Chronic obstructive pulmonary disease, unspecified: Secondary | ICD-10-CM | POA: Diagnosis not present

## 2020-04-05 ENCOUNTER — Other Ambulatory Visit: Payer: Self-pay

## 2020-04-05 ENCOUNTER — Encounter: Payer: Self-pay | Admitting: Internal Medicine

## 2020-04-05 ENCOUNTER — Ambulatory Visit (INDEPENDENT_AMBULATORY_CARE_PROVIDER_SITE_OTHER): Payer: Medicare HMO | Admitting: Internal Medicine

## 2020-04-05 VITALS — BP 171/93 | HR 138 | Resp 18 | Ht 67.0 in | Wt 130.1 lb

## 2020-04-05 DIAGNOSIS — J309 Allergic rhinitis, unspecified: Secondary | ICD-10-CM

## 2020-04-05 DIAGNOSIS — I10 Essential (primary) hypertension: Secondary | ICD-10-CM | POA: Diagnosis not present

## 2020-04-05 DIAGNOSIS — E119 Type 2 diabetes mellitus without complications: Secondary | ICD-10-CM | POA: Diagnosis not present

## 2020-04-05 DIAGNOSIS — J449 Chronic obstructive pulmonary disease, unspecified: Secondary | ICD-10-CM

## 2020-04-05 DIAGNOSIS — Z131 Encounter for screening for diabetes mellitus: Secondary | ICD-10-CM | POA: Diagnosis not present

## 2020-04-05 LAB — POCT GLYCOSYLATED HEMOGLOBIN (HGB A1C): HbA1c, POC (controlled diabetic range): 7.9 % — AB (ref 0.0–7.0)

## 2020-04-05 MED ORDER — METFORMIN HCL 500 MG PO TABS: 500.0000 mg | ORAL_TABLET | Freq: Two times a day (BID) | ORAL | 1 refills | Status: DC

## 2020-04-05 MED ORDER — FLUTICASONE PROPIONATE 50 MCG/ACT NA SUSP: 2.0000 | Freq: Every day | NASAL | 6 refills | Status: DC

## 2020-04-05 MED ORDER — AMLODIPINE BESYLATE 5 MG PO TABS: 5.0000 mg | ORAL_TABLET | Freq: Every day | ORAL | 1 refills | Status: DC

## 2020-04-05 NOTE — Progress Notes (Signed)
Established Patient Office Visit  Subjective:  Patient ID: [name], male    DOB: Sep 20, 1950  Age: 70 y.o. [name]: [id number]  CC:  Chief Complaint  Patient presents with  . Follow-up    Oxygen evaluation pt uses oxygen only when short winded. Ambulating today was 95     HPI [name] presents for follow up of his chronic medical conditions and has brought home O2 documents.  His O2 saturation was around 86% on ambulation, which improved to 95% with 2 lpm. He has been using inhalers regularly. Denies any dyspnea or wheezing at rest.  His BP was elevated today. He has been taking Maxzide. Denies any headache, dizziness, chest pain, dyspnea or palpitations.  He states that he has been using Mucinex nasal spray for nasal congestion and has not helped him. Denies any sore throat or sinus pain/pressure.  Past Medical History:  Diagnosis Date  . Asthma   . CAP (community acquired pneumonia) 12/10/2018  . COPD (chronic obstructive pulmonary disease) ([place])   . COPD with acute exacerbation ([place]) 04/18/2018  . CVA (cerebral vascular accident) ([place]) 2008   with temporary vision loss   . Depression   . ECHOCARDIOGRAM, ABNORMAL 03/06/2010   Qualifier: Diagnosis of  By: Purcell Nails, NP, Curt Bears    . Elevated PSA 2014   no diagnosis pf prostate cancer in 07/2014  . History of substance abuse ([place]) 01/14/2011   Marijuana  . History of substance abuse ([place]) 01/14/2011  . History of tobacco abuse 01/14/2011  . Hyperlipidemia   . Hypertension   . Lobar pneumonia ([place]) 12/13/2018  . Marijuana abuse   . Nicotine addiction   . Prediabetes 2014    History reviewed. No pertinent surgical history.  History reviewed. No pertinent family history.  Social History   Socioeconomic History  . Marital status: Widowed    Spouse name: Not on file  . Number of children: 1  . Years of education: 11 the grade  . Highest education level: 11th grade  Occupational History  . Occupation: unemployed,  retired  Tobacco Use  . Smoking status: Former Smoker    Packs/day: 0.50    Years: 40.00    Pack years: 20.00    Types: Cigarettes    Quit date: 04/14/2015    Years since quitting: 4.9  . Smokeless tobacco: Never Used  Vaping Use  . Vaping Use: Never used  Substance and Sexual Activity  . Alcohol use: Not Currently    Alcohol/week: 3.0 standard drinks    Types: 3 Cans of beer per week    Comment: occasionally  . Drug use: No    Comment: pt quit 20 years ago  . Sexual activity: Not Currently  Other Topics Concern  . Not on file  Social History Narrative  . Not on file   Social Determinants of Health   Financial Resource Strain: Low Risk   . Difficulty of Paying Living Expenses: Not hard at all  Food Insecurity: No Food Insecurity  . Worried About Charity fundraiser in the Last Year: Never true  . Ran Out of Food in the Last Year: Never true  Transportation Needs: No Transportation Needs  . Lack of Transportation (Medical): No  . Lack of Transportation (Non-Medical): No  Physical Activity: Insufficiently Active  . Days of Exercise per Week: 3 days  . Minutes of Exercise per Session: 30 min  Stress: No Stress Concern Present  . Feeling of Stress : Only a little  Social Connections: Socially Isolated  . Frequency of Communication with Friends and Family: Once a week  . Frequency of Social Gatherings with Friends and Family: Never  . Attends Religious Services: Never  . Active Member of Clubs or Organizations: Yes  . Attends Archivist Meetings: Never  . Marital Status: Widowed  Intimate Partner Violence: Not At Risk  . Fear of Current or Ex-Partner: No  . Emotionally Abused: No  . Physically Abused: No  . Sexually Abused: No    Outpatient Medications Prior to Visit  Medication Sig Dispense Refill  . acetaminophen (TYLENOL) 500 MG tablet Take 500 mg by mouth every 6 (six) hours as needed. Takes 1 tablet "almost" every day    . albuterol (PROVENTIL) (2.5  MG/3ML) 0.083% nebulizer solution Take 3 mLs (2.5 mg total) by nebulization every 6 (six) hours as needed for wheezing or shortness of breath. 150 mL 5  . albuterol (VENTOLIN HFA) 108 (90 Base) MCG/ACT inhaler INHALE 2 PUFFS INTO THE LUNGS EVERY FOUR HOURS AS NEEDED. ONLY IF YOU CAN'T CATCH YOUR BREATH. 8.5 g 5  . aspirin 325 MG EC tablet Take 325 mg by mouth daily.    . Budeson-Glycopyrrol-Formoterol (BREZTRI AEROSPHERE) 160-9-4.8 MCG/ACT AERO Take 2 puffs first thing in am and then another 2 puffs about 12 hours later. 10.7 g 11  . guaiFENesin (MUCINEX) 600 MG 12 hr tablet Take 600 mg by mouth 2 (two) times daily.     . pravastatin (PRAVACHOL) 40 MG tablet TAKE (1) TABLET BY MOUTH AT BEDTIME. 90 tablet 1  . predniSONE (DELTASONE) 5 MG tablet Take 1 tablet (5 mg total) by mouth daily with breakfast. 30 tablet 3  . triamterene-hydrochlorothiazide (MAXZIDE-25) 37.5-25 MG tablet Take 1.5 tablets by mouth daily. 135 tablet 1   No facility-administered medications prior to visit.    No Known Allergies  ROS Review of Systems  Constitutional: Negative for chills and fever.  HENT: Negative for congestion and sore throat.   Eyes: Negative for pain and discharge.  Respiratory: Positive for shortness of breath. Negative for cough.   Cardiovascular: Negative for chest pain and palpitations.  Gastrointestinal: Negative for constipation, diarrhea, nausea and vomiting.  Endocrine: Negative for polydipsia and polyuria.  Genitourinary: Negative for dysuria and hematuria.  Musculoskeletal: Negative for neck pain and neck stiffness.  Skin: Negative for rash.  Neurological: Negative for dizziness, weakness, numbness and headaches.  Psychiatric/Behavioral: Negative for agitation and behavioral problems.      Objective:    Physical Exam Vitals reviewed.  Constitutional:      General: He is not in acute distress.    Appearance: He is not diaphoretic.  HENT:     Head: Normocephalic and atraumatic.      Nose: Nose normal.     Mouth/Throat:     Mouth: Mucous membranes are moist.  Eyes:     General: No scleral icterus.    Extraocular Movements: Extraocular movements intact.     Pupils: Pupils are equal, round, and reactive to light.  Neck:     Vascular: No carotid bruit.  Cardiovascular:     Rate and Rhythm: Normal rate and regular rhythm.     Pulses: Normal pulses.     Heart sounds: Normal heart sounds. No murmur heard.   Pulmonary:     Breath sounds: Wheezing (Mild, diffuse b/l) present. No rales.  Abdominal:     Palpations: Abdomen is soft.     Tenderness: There is no abdominal tenderness.  Musculoskeletal:  Cervical back: Neck supple. No tenderness.     Right lower leg: No edema.     Left lower leg: No edema.  Skin:    General: Skin is warm.     Findings: No rash.  Neurological:     General: No focal deficit present.     Mental Status: He is alert and oriented to person, place, and time.     Cranial Nerves: No cranial nerve deficit.     Sensory: No sensory deficit.  Psychiatric:        Mood and Affect: Mood normal.        Behavior: Behavior normal.     BP (!) 171/93 (BP Location: Right Arm, Patient Position: Sitting)   Pulse (!) 138   Resp 18   Ht 5\' 7"  (1.702 m)   Wt 130 lb 1.9 oz (59 kg)   SpO2 97%   BMI 20.38 kg/m  Wt Readings from Last 3 Encounters:  04/05/20 130 lb 1.9 oz (59 kg)  02/07/20 131 lb 3.2 oz (59.5 kg)  12/13/19 133 lb 6.4 oz (60.5 kg)     There are no preventive care reminders to display for this patient.  There are no preventive care reminders to display for this patient.  Lab Results  Component Value Date   TSH 0.717 11/25/2019   Lab Results  Component Value Date   WBC 11.8 (H) 11/25/2019   HGB 13.4 11/25/2019   HCT 42.1 11/25/2019   MCV 97.7 11/25/2019   PLT 238 11/25/2019   Lab Results  Component Value Date   NA 137 11/25/2019   K 4.3 11/25/2019   CO2 29 11/25/2019   GLUCOSE 172 (H) 11/25/2019   BUN 11 11/25/2019    CREATININE 0.80 11/25/2019   BILITOT 0.4 03/05/2019   ALKPHOS 59 03/05/2019   AST 23 03/05/2019   ALT 17 03/05/2019   PROT 7.0 03/05/2019   ALBUMIN 4.1 03/05/2019   CALCIUM 9.4 11/25/2019   ANIONGAP 8 11/25/2019   Lab Results  Component Value Date   CHOL 189 11/12/2018   Lab Results  Component Value Date   HDL 83 11/12/2018   Lab Results  Component Value Date   LDLCALC 93 11/12/2018   Lab Results  Component Value Date   TRIG 51 11/12/2018   Lab Results  Component Value Date   CHOLHDL 2.3 11/12/2018   Lab Results  Component Value Date   HGBA1C 7.9 (A) 04/05/2020      Assessment & Plan:   Problem List Items Addressed This Visit      Cardiovascular and Mediastinum   Essential hypertension    BP Readings from Last 1 Encounters:  04/05/20 (!) 171/93   uncontrolled with Maxzide Added Amlodipine 5 mg QD Counseled for compliance with the medications Advised DASH diet      Relevant Medications   amLODipine (NORVASC) 5 MG tablet   Other Relevant Orders   CBC   Basic Metabolic Panel (BMET)     Respiratory   COPD GOLD IV/ group D  - Primary    With hypoxia on exertion On home O2 Continue Breztri and PRN Albuterol F/u with Pulmonology      Relevant Medications   amLODipine (NORVASC) 5 MG tablet   fluticasone (FLONASE) 50 MCG/ACT nasal spray   Other Relevant Orders   CBC   Basic Metabolic Panel (BMET)     Endocrine   DM (diabetes mellitus) ([place])    Likely due to Prednisone use Lab Results  Component  Value Date   HGBA1C 7.9 (A) 04/05/2020   Started Metformin 500 mg BID Will check HbA1C in the next visit      Relevant Medications   metFORMIN (GLUCOPHAGE) 500 MG tablet     Other   Screening for diabetes mellitus   Relevant Orders   POCT glycosylated hemoglobin (Hb A1C) (Completed)    Other Visit Diagnoses    Allergic rhinitis, unspecified seasonality, unspecified trigger       Relevant Medications   fluticasone (FLONASE) 50 MCG/ACT nasal  spray      Meds ordered this encounter  Medications  . amLODipine (NORVASC) 5 MG tablet    Sig: Take 1 tablet (5 mg total) by mouth daily.    Dispense:  90 tablet    Refill:  1  . fluticasone (FLONASE) 50 MCG/ACT nasal spray    Sig: Place 2 sprays into both nostrils daily.    Dispense:  16 g    Refill:  6  . metFORMIN (GLUCOPHAGE) 500 MG tablet    Sig: Take 1 tablet (500 mg total) by mouth 2 (two) times daily with a meal.    Dispense:  180 tablet    Refill:  1    Follow-up: Return in about 3 months (around 07/06/2020) for HTN and COPD.    Lindell Spar, MD

## 2020-04-05 NOTE — Assessment & Plan Note (Signed)
With hypoxia on exertion On home O2 Continue Breztri and PRN Albuterol F/u with Pulmonology

## 2020-04-05 NOTE — Assessment & Plan Note (Signed)
Likely due to Prednisone use Lab Results  Component Value Date   HGBA1C 7.9 (A) 04/05/2020   Started Metformin 500 mg BID Will check HbA1C in the next visit

## 2020-04-05 NOTE — Patient Instructions (Signed)
Please start taking Amlodipine in addition to Maxzide for hypertension.  Please continue to follow low salt diet.  Continue to use inhalers regularly.

## 2020-04-05 NOTE — Assessment & Plan Note (Signed)
BP Readings from Last 1 Encounters:  04/05/20 (!) 171/93   uncontrolled with Maxzide Added Amlodipine 5 mg QD Counseled for compliance with the medications Advised DASH diet

## 2020-04-06 ENCOUNTER — Encounter: Payer: Self-pay | Admitting: Pulmonary Disease

## 2020-04-06 ENCOUNTER — Ambulatory Visit: Payer: Medicare HMO | Admitting: Pulmonary Disease

## 2020-04-06 VITALS — BP 160/90 | HR 124 | Temp 97.9°F | Ht 67.0 in | Wt 129.2 lb

## 2020-04-06 DIAGNOSIS — J9611 Chronic respiratory failure with hypoxia: Secondary | ICD-10-CM | POA: Diagnosis not present

## 2020-04-06 DIAGNOSIS — J479 Bronchiectasis, uncomplicated: Secondary | ICD-10-CM

## 2020-04-06 DIAGNOSIS — J449 Chronic obstructive pulmonary disease, unspecified: Secondary | ICD-10-CM | POA: Diagnosis not present

## 2020-04-06 NOTE — Patient Instructions (Signed)
Follow up in 4 months 

## 2020-04-06 NOTE — Progress Notes (Signed)
[facility]  Chief Complaint  Patient presents with  . Follow-up    "I've been breathing hard since it started raining"    Constitutional:  BP (!) 160/90 (BP Location: Left Arm, Cuff Size: Normal)   Pulse (!) 124   Temp 97.9 F (36.6 C) (Other (Comment)) Comment (Src): wrist  Ht 5\' 7"  (1.702 m)   Wt 129 lb 3.2 oz (58.6 kg)   SpO2 95% Comment: Room air  BMI 20.24 kg/m   Past Medical History:  Pneumonia, CVA, Depression, HLD, HTN  Past Surgical History:  He  has no past surgical history on file.  Brief Summary:  [name] is a 70 y.o. male former cigarette and marijuana smoker with COPD with emphysema and bronchiectasis, and chronic hypoxic/hypercapnic respiratory failure.       Subjective:   He has more trouble with his breathing in rainy weather.  Saw PCP yesterday and prescribed flonase for sinus congestion.  Not having wheeze, chest pain, fever, swelling, or hemoptysis.  Uses oxygen when he gets more short of breath.  Physical Exam:   Appearance - thin  ENMT - no sinus tenderness, no oral exudate, no LAN, Mallampati 3  airway, no stridor, edentulous  Respiratory - decreased breath sounds bilaterally, no wheezing or rales  CV - s1s2 regular rate and rhythm, no murmurs  Ext - no clubbing, no edema  Skin - no rashes  Psych - normal mood and affect    Pulmonary testing:   ABG on 28% FiO2 12/11/18 >> pH 7.385, PCO2 50.2, PO2 102  PFT 07/29/19 >> FEV1 0.63 (24%), FEV1% 32, TLC 9.24 (143%), DLCO 63%  Chest Imaging:   CT chest 07/05/19 >> mild centrilobular and paraseptal emphysema, BTX lower lobes, bulla RLL  Social History:  He  reports that he quit smoking about 4 years ago. His smoking use included cigarettes. He has a 20.00 pack-year smoking history. He has never used smokeless tobacco. He reports previous alcohol use of about 3.0 standard drinks of alcohol per week. He reports that he does not use  drugs.  Family History:  His family history is not on file.     Assessment/Plan:   Severe COPD with emphysema. - steroid dependent; continue prednisone 5 mg daily - continue breztri and prn albuterol  Bronchiectasis. - likely from prior episodes of pneumonia - prn mucinex DM - prn flutter valve  Chronic hypoxic/hypercapnic respiratory failure. - he uses 3 liters with activity - uses [employer] for his DME  Social determinants of health. - he has limited reading ability which impacts his ability to follow through with medical instructions  Time Spent Involved in Patient Care on Day of Examination:  22 minutes  Follow up:  Patient Instructions  Follow up in 4 months   Medication List:   Allergies as of 04/06/2020   No Known Allergies     Medication List       Accurate as of April 06, 2020 11:27 AM. If you have any questions, ask your nurse or doctor.        acetaminophen 500 MG tablet Commonly known as: TYLENOL Take 500 mg by mouth every 6 (six) hours as needed. Takes 1 tablet "almost" every day   albuterol (2.5 MG/3ML) 0.083% nebulizer solution Commonly known as: PROVENTIL Take 3 mLs (2.5 mg total) by nebulization every 6 (six) hours as needed for wheezing or shortness of breath.   albuterol 108 (90 Base) MCG/ACT inhaler Commonly known  as: VENTOLIN HFA INHALE 2 PUFFS INTO THE LUNGS EVERY FOUR HOURS AS NEEDED. ONLY IF YOU CAN'T CATCH YOUR BREATH.   amLODipine 5 MG tablet Commonly known as: NORVASC Take 1 tablet (5 mg total) by mouth daily.   aspirin 325 MG EC tablet Take 325 mg by mouth daily.   Breztri Aerosphere 160-9-4.8 MCG/ACT Aero Generic drug: Budeson-Glycopyrrol-Formoterol Take 2 puffs first thing in am and then another 2 puffs about 12 hours later.   fluticasone 50 MCG/ACT nasal spray Commonly known as: FLONASE Place 2 sprays into both nostrils daily.   guaiFENesin 600 MG 12 hr tablet Commonly known as: MUCINEX Take 600 mg by mouth 2 (two)  times daily.   metFORMIN 500 MG tablet Commonly known as: GLUCOPHAGE Take 1 tablet (500 mg total) by mouth 2 (two) times daily with a meal.   pravastatin 40 MG tablet Commonly known as: PRAVACHOL TAKE (1) TABLET BY MOUTH AT BEDTIME.   predniSONE 5 MG tablet Commonly known as: DELTASONE Take 1 tablet (5 mg total) by mouth daily with breakfast.   triamterene-hydrochlorothiazide 37.5-25 MG tablet Commonly known as: MAXZIDE-25 Take 1.5 tablets by mouth daily.       Signature:  Chesley Mires, MD [facility] Pager - [phone] 04/06/2020, 11:27 AM

## 2020-04-12 ENCOUNTER — Ambulatory Visit: Payer: Medicare HMO | Admitting: Family Medicine

## 2020-04-20 ENCOUNTER — Other Ambulatory Visit: Payer: Self-pay | Admitting: *Deleted

## 2020-04-20 DIAGNOSIS — E119 Type 2 diabetes mellitus without complications: Secondary | ICD-10-CM

## 2020-04-20 MED ORDER — METFORMIN HCL 500 MG PO TABS: 500.0000 mg | ORAL_TABLET | Freq: Two times a day (BID) | ORAL | 1 refills | Status: DC

## 2020-04-25 DIAGNOSIS — J449 Chronic obstructive pulmonary disease, unspecified: Secondary | ICD-10-CM | POA: Diagnosis not present

## 2020-05-04 ENCOUNTER — Ambulatory Visit (INDEPENDENT_AMBULATORY_CARE_PROVIDER_SITE_OTHER): Payer: Medicare HMO | Admitting: *Deleted

## 2020-05-04 DIAGNOSIS — I10 Essential (primary) hypertension: Secondary | ICD-10-CM | POA: Diagnosis not present

## 2020-05-04 DIAGNOSIS — J449 Chronic obstructive pulmonary disease, unspecified: Secondary | ICD-10-CM | POA: Diagnosis not present

## 2020-05-04 NOTE — Patient Instructions (Signed)
Visit Information  PATIENT GOALS: Goals Addressed            This Visit's Progress   . Track and Manage My Blood Pressure-Hypertension       Timeframe:  Long-Range Goal Priority:  High Start Date:     02/24/2020                        Expected End Date:      08/23/2020                 Follow Up Date 06/15/2020   . Choose a place to take my blood pressure (home, clinic or office, retail store)  . Take medications as prescribed . Avoid/limit salty foods such as chips, fast food, canned soups, etc. . Ask your friend to look at food labels for you to see salt content . Choose fresh or frozen foods as much as you can . Follow up with primary doctor 07/05/2020 . Call RN care manager if you have any questions at [phone]   Why is this important?    You won't feel high blood pressure, but it can still hurt your blood vessels.   High blood pressure can cause heart or kidney problems. It can also cause a stroke.   Making lifestyle changes like losing a little weight or eating less salt will help.   Checking your blood pressure at home and at different times of the day can help to control blood pressure.   If the doctor prescribes medicine remember to take it the way the doctor ordered.   Call the office if you cannot afford the medicine or if there are questions about it.     Notes:     . Track and Manage My Symptoms-COPD       Timeframe:  Long-Range Goal Priority:  High Start Date:      02/24/2020                       Expected End Date:   08/23/2020                    Follow Up Date 06/15/2020 . Continue to ask friends for help as needed . Alternate activity with rest . Continue to follow rescue plan if symptoms flare-up . call your doctor for any change in symptoms such as coughing more than usual, inhalers are not helping or overall not feeling well . Follow up with Dr. Halford Chessman (lung doctor) as needed and Dr. Moshe Cipro on 07/05/20 . Practice deep breathing exercises . Continue  wearing a mask outside as needed when you mow, pollen counts are still high     Why is this important?    Tracking your symptoms and other information about your health helps your doctor plan your care.   Write down the symptoms, the time of day, what you were doing and what medicine you are taking.   You will soon learn how to manage your symptoms.     Notes:        The patient verbalized understanding of instructions, educational materials, and care plan provided today and declined offer to receive copy of patient instructions, educational materials, and care plan.   Telephone follow up appointment with care management team member scheduled for:   06/15/2020  Jacqlyn Larsen [facility], BSN RN Case Manager [facility] [phone]

## 2020-05-04 NOTE — Chronic Care Management (AMB) (Signed)
Chronic Care Management   CCM RN Visit Note  05/04/2020 Name: [name] [name]: [id number] DOB: Sep 01, 1950  Subjective: [name] is a 70 y.o. year old male who is a primary care patient of [name], MD. The care management team was consulted for assistance with disease management and care coordination needs.    Engaged with patient by telephone for follow up visit in response to provider referral for case management and/or care coordination services.   Consent to Services:  The patient was given the following information about Chronic Care Management services today, agreed to services, and gave verbal consent: 1. CCM service includes personalized support from designated clinical staff supervised by the primary care provider, including individualized plan of care and coordination with other care providers 2. 24/7 contact phone numbers for assistance for urgent and routine care needs. 3. Service will only be billed when office clinical staff spend 20 minutes or more in a month to coordinate care. 4. Only one practitioner may furnish and bill the service in a calendar month. 5.The patient may stop CCM services at any time (effective at the end of the month) by phone call to the office staff. 6. The patient will be responsible for cost sharing (co-pay) of up to 20% of the service fee (after annual deductible is met). Patient agreed to services and consent obtained.  Patient agreed to services and verbal consent obtained.   Assessment: Review of patient past medical history, allergies, medications, health status, including review of consultants reports, laboratory and other test data, was performed as part of comprehensive evaluation and provision of chronic care management services.   SDOH (Social Determinants of Health) assessments and interventions performed:    CCM Care Plan  No Known Allergies  Outpatient Encounter Medications as of 05/04/2020  Medication Sig  .  acetaminophen (TYLENOL) 500 MG tablet Take 500 mg by mouth every 6 (six) hours as needed. Takes 1 tablet "almost" every day  . albuterol (PROVENTIL) (2.5 MG/3ML) 0.083% nebulizer solution Take 3 mLs (2.5 mg total) by nebulization every 6 (six) hours as needed for wheezing or shortness of breath.  [name] Kitchen albuterol (VENTOLIN HFA) 108 (90 Base) MCG/ACT inhaler INHALE 2 PUFFS INTO THE LUNGS EVERY FOUR HOURS AS NEEDED. ONLY IF YOU CAN'T CATCH YOUR BREATH.  [name] Kitchen amLODipine (NORVASC) 5 MG tablet Take 1 tablet (5 mg total) by mouth daily.  [name] Kitchen aspirin 325 MG EC tablet Take 325 mg by mouth daily.  . Budeson-Glycopyrrol-Formoterol (BREZTRI AEROSPHERE) 160-9-4.8 MCG/ACT AERO Take 2 puffs first thing in am and then another 2 puffs about 12 hours later.  . fluticasone (FLONASE) 50 MCG/ACT nasal spray Place 2 sprays into both nostrils daily.  [name] Kitchen guaiFENesin (MUCINEX) 600 MG 12 hr tablet Take 600 mg by mouth 2 (two) times daily.   . metFORMIN (GLUCOPHAGE) 500 MG tablet Take 1 tablet (500 mg total) by mouth 2 (two) times daily with a meal.  . pravastatin (PRAVACHOL) 40 MG tablet TAKE (1) TABLET BY MOUTH AT BEDTIME.  . predniSONE (DELTASONE) 5 MG tablet Take 1 tablet (5 mg total) by mouth daily with breakfast.  . triamterene-hydrochlorothiazide (MAXZIDE-25) 37.5-25 MG tablet Take 1.5 tablets by mouth daily.  . [DISCONTINUED] simvastatin (ZOCOR) 40 MG tablet Take 40 mg by mouth daily.   No facility-administered encounter medications on file as of 05/04/2020.    Patient Active Problem List   Diagnosis Date Noted  . Encounter for annual physical exam 12/13/2019  . Screening for diabetes mellitus 07/09/2019  .  Rhinitis, chronic 06/10/2019  . Pulmonary infiltrates on CXR 05/29/2019  . COPD GOLD IV/ group D  05/11/2019  . Low oxygen saturation 03/23/2019  . Food insecurity 11/22/2018  . DOE (dyspnea on exertion) 04/18/2018  . Glucose intolerance 04/18/2018  . Social isolation 03/17/2013  . Asthma with COPD (chronic  obstructive pulmonary disease) ([place]) 11/12/2012  . Hyperglycemia 01/14/2011  . Tachycardia 11/05/2010  . DM (diabetes mellitus) ([place]) 10/26/2009  . Vitamin D deficiency 06/28/2009  . IGT (impaired glucose tolerance) 01/25/2009  . PSA, INCREASED 01/25/2009  . Hyperlipidemia 07/30/2007  . Essential hypertension 07/30/2007    Conditions to be addressed/monitored:HTN and COPD  Care Plan : COPD (Adult)  Updates made by Kassie Mends, RN since 05/04/2020 12:00 AM    Problem: Disease Progression (COPD)   Priority: High    Long-Range Goal: Disease Progression Minimized or Managed   Start Date: 02/24/2020  Expected End Date: 08/22/2020  This Visit's Progress: On track  Recent Progress: On track  Priority: High  Note:   Timeframe:  Long-Range Goal Priority:  High Start Date:      02/24/2020                       Expected End Date:       08/23/2020 Current Barriers:  [name] Kitchen Knowledge deficits related to basic understanding of COPD disease process . Knowledge deficits related to basic COPD self care/management- continues to need education/ reinforcement for COPD action plan and importance of calling doctor for symptom management . Literacy barriers- patient has friend that reads his mail etc for him . Transportation barriers- patient rides moped to appointments, shopping, etc. . Limited Social Support- adult children live out of state, patient is widowed, has friend Audiological scientist) that helps him . Lacks social connections - patient does not go to church or other groups (due to [employer] and respiratory issues) Case Manager Clinical Goal(s): [name] Kitchen Patient will verbalize understanding of COPD action plan and when to seek appropriate levels of medical care . Patient will verbalize basic understanding of COPD disease process and self care activities  Interventions:  . Collaboration with Fayrene Helper, MD regarding development and update of comprehensive plan of care as evidenced by provider attestation  and co-signature . Inter-disciplinary care team collaboration (see longitudinal plan of care)  Provided patient verbal COPD education/ reinforcement on self care/management/and exacerbation prevention  Reinforced COPD action plan and importance of daily self assessment and calling doctor promptly as needed  Reinforced instruction about proper use of medications used for management of COPD including inhalers  Reviewed importance of the role of exercise in the management of COPD  Reviewed triggers for COPD exacerbation such as environmental stimulants (pollen, smoke, etc) and to avoid, reminded patient to wear his mask as needed  Reinforced using Flonase (new prescription) for sinus congestion Patient Goals/Self-Care Activities: . Continue to ask friends for help as needed . Alternate activity with rest . Continue to follow rescue plan if symptoms flare-up . call your doctor for any change in symptoms such as coughing more than usual, inhalers are not helping or overall not feeling well . Follow up with Dr. Halford Chessman (lung doctor) as needed and Dr. Moshe Cipro on 07/05/20 . Practice deep breathing exercises . Continue wearing a mask outside as needed when you mow, pollen counts are still high Follow up plan: by telephone 06/15/2020   Care Plan : Hypertension (Adult)  Updates made by Kassie Mends, RN since 05/04/2020 12:00 AM  Problem: Disease Progression (Hypertension)   Priority: High    Long-Range Goal: Disease Progression Prevented or Minimized   Start Date: 02/24/2020  Expected End Date: 08/22/2020  This Visit's Progress: Not on track  Recent Progress: On track  Priority: High  Note:   Timeframe:  Long-Range Goal Priority:  High Start Date:         02/24/2020                    Expected End Date:  08/23/2020  Objective:  . Last practice recorded BP readings:  BP Readings from Last 3 Encounters:  02/07/20 (!) 148/88  12/13/19 135/77  12/01/19 (!) 142/82 .   [name] Kitchen Most recent  eGFR/CrCl: No results found for: EGFR  No components found for: CRCL Current Barriers:  [name] Kitchen Knowledge Deficits related to basic understanding of hypertension pathophysiology and self care management- patient does not check blood pressure at home, will continue to have checked at doctor's office or retail store, pt rides moped to appointments. . Literacy barriers- patient cannot read but has a friend (neighbor) who helps him read anything mailed to his home . Limited Social Support- adult children live out of state, patient is widowed, does not want to go to assisted living at this time (as previously considered)  . Lacks social connections- patient verbalizes places he could go to activities such as church, Social research officer, government but due to [employer] and patient's respiratory issues has not been doing so. Case Manager Clinical Goal(s):  patient will verbalize understanding of plan for hypertension management  patient will attend all scheduled medical appointments: Dr. Moshe Cipro 07/05/20, saw Dr. Halford Chessman 04/06/20 Interventions:  . Collaboration with Fayrene Helper, MD regarding development and update of comprehensive plan of care as evidenced by provider attestation and co-signature . Inter-disciplinary care team collaboration (see longitudinal plan of care) . Evaluation of current treatment plan related to hypertension self management and patient's adherence to plan as established by provider. . Reinforced education to patient re: stroke prevention, s/s of heart attack and stroke, DASH diet, complications of uncontrolled blood pressure . Reviewed choosing fresh and frozen foods if possible . Reviewed importance of taking medications as prescribed and calling for refills so does not run out . Discussed plans with patient for ongoing care management follow up and provided patient with direct contact information for care management team, pt wrote out RN care manager phone number and repeated back . Reviewed scheduled/upcoming  provider appointments including:  primary care provider and pulmonologist, reviewed dates with patient and he does have transportation Patient Goals/Self-Care Activities Over the next 30 days patient will- . Self administers medications as prescribed . Attends all scheduled provider appointments . Calls provider office for new concerns, questions, or BP outside discussed parameters . Follows a low sodium diet/DASH dietChoose a place to take my blood pressure (home, clinic or office, retail store)  . Take medications as prescribed . Avoid/limit salty foods such as chips, fast food, canned soups, etc. . Ask your friend to look at food labels for you to see salt content . Choose fresh or frozen foods as much as you can . Follow up with primary doctor 07/05/2020 . Call RN care manager if you have any questions at [phone] Follow Up Plan: Telephone follow up appointment with care management team member scheduled for:   06/15/2020           Plan:Telephone follow up appointment with care management team member scheduled for:  06/15/2020  Jacqlyn Larsen  RNC, BSN RN Case Advertising copywriter Primary Care [phone]

## 2020-05-22 ENCOUNTER — Other Ambulatory Visit: Payer: Self-pay

## 2020-05-22 MED ORDER — ALBUTEROL SULFATE HFA 108 (90 BASE) MCG/ACT IN AERS
INHALATION_SPRAY | RESPIRATORY_TRACT | 5 refills | Status: DC
Start: 2020-05-22 — End: 2020-11-16

## 2020-05-25 DIAGNOSIS — J449 Chronic obstructive pulmonary disease, unspecified: Secondary | ICD-10-CM | POA: Diagnosis not present

## 2020-06-06 ENCOUNTER — Other Ambulatory Visit: Payer: Self-pay

## 2020-06-06 ENCOUNTER — Ambulatory Visit: Payer: Medicare HMO

## 2020-06-06 LAB — HM DIABETES EYE EXAM

## 2020-06-15 ENCOUNTER — Ambulatory Visit (INDEPENDENT_AMBULATORY_CARE_PROVIDER_SITE_OTHER): Payer: Medicare HMO | Admitting: *Deleted

## 2020-06-15 DIAGNOSIS — E119 Type 2 diabetes mellitus without complications: Secondary | ICD-10-CM | POA: Diagnosis not present

## 2020-06-15 DIAGNOSIS — J449 Chronic obstructive pulmonary disease, unspecified: Secondary | ICD-10-CM | POA: Diagnosis not present

## 2020-06-15 DIAGNOSIS — I10 Essential (primary) hypertension: Secondary | ICD-10-CM | POA: Diagnosis not present

## 2020-06-15 NOTE — Patient Instructions (Signed)
Visit Information  PATIENT GOALS:  Goals Addressed             This Visit's Progress    Monitor and Manage My Blood Sugar-Diabetes Type 2       Timeframe:  Long-Range Goal Priority:  High Start Date:           06/15/2020                  Expected End Date:     12/15/2020                  Follow Up Date- 07/13/2020   - check blood sugar at prescribed times- you are currently not checking and will follow up with primary doctor on 6/29 with another Hgb AIC check and further instructions - Try to limit or stop drinking sugary drinks - Be mindful of carbohydrate intake at each meal- too much rice, pasta, bread, starchy food will increase your blood sugar - avoid/ limit concentrated sweets such as cookies, pie, cake, etc. - take metformin as prescribed - please look over handout mailed to you about low blood sugar and actions to take   Why is this important?   Checking your blood sugar at home helps to keep it from getting very high or very low.  Writing the results in a diary or log helps the doctor know how to care for you.  Your blood sugar log should have the time, date and the results.  Also, write down the amount of insulin or other medicine that you take.  Other information, like what you ate, exercise done and how you were feeling, will also be helpful.     Notes:       Track and Manage My Blood Pressure-Hypertension       Timeframe:  Long-Range Goal Priority:  High Start Date:     02/24/2020                        Expected End Date:      08/23/2020                 Follow Up Date 07/13/2020   Choose a place to take my blood pressure (home, clinic or office, retail store)  Take medications as prescribed and keep refilled on time Avoid/limit salty foods such as chips, fast food, canned soups, etc. Continue to ask your friend to look at food labels for you to see salt content Choose fresh or frozen foods as much as you can Follow up with primary doctor 07/05/2020 Call RN care  manager if you have any questions at [phone]   Why is this important?   You won't feel high blood pressure, but it can still hurt your blood vessels.  High blood pressure can cause heart or kidney problems. It can also cause a stroke.  Making lifestyle changes like losing a little weight or eating less salt will help.  Checking your blood pressure at home and at different times of the day can help to control blood pressure.  If the doctor prescribes medicine remember to take it the way the doctor ordered.  Call the office if you cannot afford the medicine or if there are questions about it.     Notes:       Track and Manage My Symptoms-COPD       Timeframe:  Long-Range Goal Priority:  High Start Date:      02/24/2020  Expected End Date:   08/23/2020                    Follow Up Date 07/13/2020 Continue to ask friends for help as needed Alternate activity with rest promotes energy conservation and keeps you from getting too tired Continue to follow rescue, be mindful of how you are feeling each day call your doctor for any change in symptoms such as coughing more than usual, inhalers are not helping or overall not feeling well Follow up with Dr. Halford Chessman (lung doctor) as needed and Dr. Moshe Cipro on 07/05/20 Practice deep breathing exercises Continue wearing a mask as needed Be mindful of the hot, humid weather and stay indoors as needed especially during mid-day to early afternoon. Use oxygen as needed and only as prescribed- do not increase 02 rate unless you discuss with your doctor first Be mindful of oxygen tubing so that you do not trip      Why is this important?   Tracking your symptoms and other information about your health helps your doctor plan your care.  Write down the symptoms, the time of day, what you were doing and what medicine you are taking.  You will soon learn how to manage your symptoms.     Notes:          The patient verbalized  understanding of instructions, educational materials, and care plan provided today and agreed to receive a mailed copy of patient instructions, educational materials, and care plan.   Telephone follow up appointment with care management team member scheduled for:  Signature: Jacqlyn Larsen [facility], BSN RN Case Manager [facility] [phone]    Hypoglycemia Hypoglycemia is when the sugar (glucose) level in your blood is too low. Low blood sugar can happen to people who have diabetes and people who do not have diabetes. Low blood sugar can happen quickly, and it can be an emergency. What are the causes? This condition happens most often in people who have diabetes and may be caused by: Diabetes medicine. Not eating enough, or not eating often enough. Doing more physical activity. Drinking alcohol on an empty stomach. If you do not have diabetes, hypoglycemia may be caused by: A tumor in the pancreas. Not eating enough, or not eating for long periods at a time (fasting). A very bad infection or illness. Problems after having weight loss (bariatric) surgery. Kidney failure or liver failure. Certain medicines. What increases the risk? This condition is more likely to develop in people who: Have diabetes and take medicines to lower their blood sugar. Abuse alcohol. Have a very bad illness. What are the signs or symptoms? Symptoms depend on whether your low blood sugar is mild, moderate, or very low. Mild Hunger. Feeling worried or nervous (anxious). Sweating and feeling clammy. Feeling dizzy or light-headed. Being sleepy or having trouble sleeping. Feeling like you may vomit (nauseous). A fast heartbeat. A headache. Blurry vision. Being irritable or grouchy. Tingling or loss of feeling (numbness) around your mouth, lips, or tongue. Trouble with moving (coordination). Moderate Confusion and poor judgment. Behavior changes. Weakness. Uneven heartbeats. Very  low Very low blood sugar (severe hypoglycemia) is a medical emergency. It can cause: Fainting. Jerky movements that you cannot control (seizure). Loss of consciousness (coma). Death. How is this treated? Treating low blood sugar Low blood sugar is often treated by eating or drinking something sugary right away. The snack should contain 15 grams of a fast-acting carb (carbohydrate). Options include: 4 oz (120  mL) of fruit juice. 4-6 oz (120-150 mL) of regular soda (not diet soda). 8 oz (240 mL) of low-fat milk. Several pieces of hard candy. Check food labels to find out how many to eat for 15 grams. 1 Tbsp (15 mL) of sugar or honey. Treating low blood sugar if you have diabetes If you can think clearly and swallow safely, follow the 15:15 rule: Take 15 grams of a fast-acting carb. Talk with your doctor about how much you should take. Always keep a source of fast-acting carb with you, such as: Sugar tablets (glucose pills). Take 4 pills. Several pieces of hard candy. Check food labels to see how many pieces to eat for 15 grams. 4 oz (120 mL) of fruit juice. 4-6 oz (120-150 mL) of regular (not diet) soda. 1 Tbsp (15 mL) of honey or sugar. Check your blood sugar 15 minutes after you take the carb. If your blood sugar is still at or below 70 mg/dL (3.9 mmol/L), take 15 grams of a carb again. If your blood sugar does not go above 70 mg/dL (3.9 mmol/L) after 3 tries, get help right away. After your blood sugar goes back to normal, eat a meal or a snack within 1 hour.   Treating very low blood sugar If your blood sugar is at or below 54 mg/dL (3 mmol/L), you have very low blood sugar, or severe hypoglycemia. This is an emergency. Get medical help right away. If you have very low blood sugar and you cannot eat or drink, you will need to be given a hormone called glucagon. A family member or friend should learn how to check your blood sugar and how to give you glucagon. Ask your doctor if you need  to have an emergency glucagon kit at home. Very low blood sugar may also need to be treated in a hospital. Follow these instructions at home: General instructions Take over-the-counter and prescription medicines only as told by your doctor. Stay aware of your blood sugar as told by your doctor. If you drink alcohol: Limit how much you use to: 0-1 drink a day for nonpregnant women. 0-2 drinks a day for men. Be aware of how much alcohol is in your drink. In the [place] one drink equals one 12 oz bottle of beer (355 mL), one 5 oz glass of wine (148 mL), or one 1 oz glass of hard liquor (44 mL). Keep all follow-up visits as told by your doctor. This is important. If you have diabetes: Always have a rapid-acting carb (15 grams) option with you to treat low blood sugar. Follow your diabetes care plan as told by your doctor. Make sure you: Know the symptoms of low blood sugar. Check your blood sugar as often as told by your doctor. Always check it before and after exercise. Always check your blood sugar before you drive. Take your medicines as told. Follow your meal plan. Eat on time. Do not skip meals. Share your diabetes care plan with: Your work or school. People you live with. Carry a card or wear jewelry that says you have diabetes.   Contact a doctor if: You have trouble keeping your blood sugar in your target range. You have low blood sugar often. Get help right away if: You still have symptoms after you eat or drink something that contains 15 grams of fast-acting carb and you cannot get your blood sugar above 70 mg/dL by following the 15:15 rule. Your blood sugar is at or below 54 mg/dL (3  mmol/L). You have a seizure. You faint. These symptoms may be an emergency. Do not wait to see if the symptoms will go away. Get medical help right away. Call your local emergency services (911 in the [place] Do not drive yourself to the hospital. Summary Hypoglycemia happens when the level of  sugar (glucose) in your blood is too low. Low blood sugar can happen to people who have diabetes and people who do not have diabetes. Low blood sugar can happen quickly, and it can be an emergency. Make sure you know the symptoms of low blood sugar and know how to treat it. Always keep a source of sugar (fast-acting carb) with you to treat low blood sugar. This information is not intended to replace advice given to you by your health care provider. Make sure you discuss any questions you have with your health care provider. Document Revised: 11/18/2018 Document Reviewed: 11/18/2018 Elsevier Patient Education  2021 [employer].

## 2020-06-15 NOTE — Chronic Care Management (AMB) (Signed)
Chronic Care Management   CCM RN Visit Note  06/15/2020 Name: [name] [name]: [id number] DOB: Nov 25, 1950  Subjective: [name] is a 70 y.o. year old male who is a primary care patient of [name], MD. The care management team was consulted for assistance with disease management and care coordination needs.    Engaged with patient by telephone for follow up visit in response to provider referral for case management and/or care coordination services.   Consent to Services:  The patient was given information about Chronic Care Management services, agreed to services, and gave verbal consent prior to initiation of services.  Please see initial visit note for detailed documentation.   Patient agreed to services and verbal consent obtained.   Assessment: Review of patient past medical history, allergies, medications, health status, including review of consultants reports, laboratory and other test data, was performed as part of comprehensive evaluation and provision of chronic care management services.   SDOH (Social Determinants of Health) assessments and interventions performed:  SDOH Interventions    Flowsheet Row Most Recent Value  SDOH Interventions   Food Insecurity Interventions Intervention Not Indicated  Housing Interventions Intervention Not Indicated  Physical Activity Interventions Intervention Not Indicated        CCM Care Plan  No Known Allergies  Outpatient Encounter Medications as of 06/15/2020  Medication Sig   acetaminophen (TYLENOL) 500 MG tablet Take 500 mg by mouth every 6 (six) hours as needed. Takes 1 tablet "almost" every day   albuterol (PROVENTIL) (2.5 MG/3ML) 0.083% nebulizer solution Take 3 mLs (2.5 mg total) by nebulization every 6 (six) hours as needed for wheezing or shortness of breath.   albuterol (VENTOLIN HFA) 108 (90 Base) MCG/ACT inhaler INHALE 2 PUFFS INTO THE LUNGS EVERY FOUR HOURS AS NEEDED. ONLY IF YOU CAN'T CATCH YOUR BREATH.    amLODipine (NORVASC) 5 MG tablet Take 1 tablet (5 mg total) by mouth daily.   aspirin 325 MG EC tablet Take 325 mg by mouth daily.   Budeson-Glycopyrrol-Formoterol (BREZTRI AEROSPHERE) 160-9-4.8 MCG/ACT AERO Take 2 puffs first thing in am and then another 2 puffs about 12 hours later.   fluticasone (FLONASE) 50 MCG/ACT nasal spray Place 2 sprays into both nostrils daily.   metFORMIN (GLUCOPHAGE) 500 MG tablet Take 1 tablet (500 mg total) by mouth 2 (two) times daily with a meal.   pravastatin (PRAVACHOL) 40 MG tablet TAKE (1) TABLET BY MOUTH AT BEDTIME.   predniSONE (DELTASONE) 5 MG tablet Take 1 tablet (5 mg total) by mouth daily with breakfast.   triamterene-hydrochlorothiazide (MAXZIDE-25) 37.5-25 MG tablet Take 1.5 tablets by mouth daily.   guaiFENesin (MUCINEX) 600 MG 12 hr tablet Take 600 mg by mouth 2 (two) times daily.  (Patient not taking: Reported on 06/15/2020)   [DISCONTINUED] simvastatin (ZOCOR) 40 MG tablet Take 40 mg by mouth daily.   No facility-administered encounter medications on file as of 06/15/2020.    Patient Active Problem List   Diagnosis Date Noted   Encounter for annual physical exam 12/13/2019   Screening for diabetes mellitus 07/09/2019   Rhinitis, chronic 06/10/2019   Pulmonary infiltrates on CXR 05/29/2019   COPD GOLD IV/ group D  05/11/2019   Low oxygen saturation 03/23/2019   Food insecurity 11/22/2018   DOE (dyspnea on exertion) 04/18/2018   Glucose intolerance 04/18/2018   Social isolation 03/17/2013   Asthma with COPD (chronic obstructive pulmonary disease) ([place]) 11/12/2012   Hyperglycemia 01/14/2011   Tachycardia 11/05/2010   DM (  diabetes mellitus) ([place]) 10/26/2009   Vitamin D deficiency 06/28/2009   IGT (impaired glucose tolerance) 01/25/2009   PSA, INCREASED 01/25/2009   Hyperlipidemia 07/30/2007   Essential hypertension 07/30/2007    Conditions to be addressed/monitored:HTN, COPD, and DMII  Care Plan : COPD (Adult)  Updates made by  Kassie Mends, RN since 06/15/2020 12:00 AM     Problem: Disease Progression (COPD)   Priority: High     Long-Range Goal: Disease Progression Minimized or Managed   Start Date: 02/24/2020  Expected End Date: 08/22/2020  This Visit's Progress: On track  Recent Progress: On track  Priority: High  Note:   Timeframe:  Long-Range Goal Priority:  High Start Date:      02/24/2020                       Expected End Date:       08/23/2020 Follow up date - 07/13/2020 Current Barriers:  Knowledge deficits related to basic understanding of COPD disease process and importance of oxygen management and safety Knowledge deficits related to basic COPD self care/management- continues to need education/ reinforcement for COPD action plan and importance of calling doctor for symptom management Literacy barriers- patient has friend that reads his mail etc for him Transportation barriers- patient rides moped to appointments, shopping, etc.- has friend who provides transportation at times Limited Social Support- adult children live out of state, patient is widowed, has friend Audiological scientist) that helps him Microbiologist - patient does not go to church or other groups (due respiratory issues) Case Manager Clinical Goal(s): Patient will verbalize understanding of COPD action plan and when to seek appropriate levels of medical care Patient will verbalize basic understanding of COPD disease process and self care activities  Interventions:  Collaboration with Fayrene Helper, MD regarding development and update of comprehensive plan of care as evidenced by provider attestation and co-signature Inter-disciplinary care team collaboration (see longitudinal plan of care) Provided patient verbal COPD education/ reinforcement on self care/management/and exacerbation prevention Reviewed COPD action plan and importance of daily self assessment and calling doctor promptly as needed Reviewed instruction about proper  use of medications used for management of COPD including inhalers Reviewed importance of the role of exercise in the management of COPD and encouraged pt to continue being active, walking as tolerated Reinforced triggers for COPD exacerbation such as environmental stimulants (pollen, smoke, etc) and to avoid, reinforced wearing mask as needed Reinforced using Flonase (new prescription) for sinus congestion Reviewed oxygen safety tips (no one is to smoke around oxygen, keep flow rate as ordered by doctor, be mindful of long tubing). Patient Goals/Self-Care Activities: Continue to ask friends for help as needed Alternate activity with rest promotes energy conservation and keeps you from getting too tired Continue to follow rescue, be mindful of how you are feeling each day call your doctor for any change in symptoms such as coughing more than usual, inhalers are not helping or overall not feeling well Follow up with Dr. Halford Chessman (lung doctor) as needed and Dr. Moshe Cipro on 07/05/20 Practice deep breathing exercises Continue wearing a mask as needed Be mindful of the hot, humid weather and stay indoors as needed especially during mid-day to early afternoon. Use oxygen as needed and only as prescribed- do not increase 02 rate unless you discuss with your doctor first Be mindful of oxygen tubing so that you do not trip  Follow up plan: by telephone outreach- 07/13/2020    Care Plan :  Hypertension (Adult)  Updates made by Kassie Mends, RN since 06/15/2020 12:00 AM     Problem: Disease Progression (Hypertension)   Priority: High     Long-Range Goal: Disease Progression Prevented or Minimized   Start Date: 02/24/2020  Expected End Date: 08/22/2020  This Visit's Progress: Not on track  Recent Progress: Not on track  Priority: High  Note:   Timeframe:  Long-Range Goal Priority:  High Start Date:         02/24/2020                    Expected End Date:  08/23/2020  Follow up date-  07/13/2020  Objective:  Last practice recorded BP readings:  BP Readings from Last 3 Encounters:  02/07/20 (!) 148/88  12/13/19 135/77  12/01/19 (!) 142/82   Most recent eGFR/CrCl: No results found for: EGFR  No components found for: CRCL Current Barriers:  Knowledge Deficits related to basic understanding of hypertension pathophysiology and self care management- patient does not check blood pressure at home, will continue to have checked at [place] office or retail store, pt rides Moped to appointments and has friend that assists with transportation if needed, pt is aware of [employer] transportation service. Literacy barriers- patient cannot read but has a friend (neighbor) who helps him read anything mailed to his home Limited Social Support- adult children live out of state, patient is widowed, does not want to go to assisted living at this time (as previously considered)  [place]- patient verbalizes places he could go to activities such as church, but due to health issues/ breathing he chooses not to. Case Manager Clinical Goal(s):  patient will verbalize understanding of plan for hypertension management  patient will attend all scheduled medical appointments: follow up with Dr. Moshe Cipro 07/05/20, saw Dr. Halford Chessman 04/06/20 Interventions:  Collaboration with Fayrene Helper, MD regarding development and update of comprehensive plan of care as evidenced by provider attestation and co-signature Inter-disciplinary care team collaboration (see longitudinal plan of care) Evaluation of current treatment plan related to hypertension self management and patient's adherence to plan as established by provider. Reviewed education to patient re: stroke prevention, s/s of heart attack and stroke, DASH diet, complications of uncontrolled blood pressure Reviewed choosing fresh and frozen foods if possible, avoid high sodium content foods Reviewed importance of taking medications as prescribed and  calling for refills so does not run out Discussed plans with patient for ongoing care management follow up and provided patient with direct contact information for care management team, pt verbalizes he has RN CM contact number Reviewed scheduled/upcoming provider appointments including:  primary care provider, reviewed dates with patient, verbalizes understanding Patient Goals/Self-Care Activities Over the next 30 days patient will- Self administers medications as prescribed Attends all scheduled provider appointments Calls provider office for new concerns, questions, or BP outside discussed parameters Choose a place to take my blood pressure (home, clinic or office, retail store)  Take medications as prescribed and keep refilled on time Avoid/limit salty foods such as chips, fast food, canned soups, etc. Continue to ask your friend to look at food labels for you to see salt content Choose fresh or frozen foods as much as you can Follow up with primary doctor 07/05/2020 Call RN care manager if you have any questions at [phone] Follow Up Plan: Telephone follow up appointment with care management team member scheduled for:   07/13/2020       Care Plan : Diabetes Type 2 (  Adult)  Updates made by Kassie Mends, RN since 06/15/2020 12:00 AM     Problem: Glycemic Management (Diabetes, Type 2)   Priority: High     Long-Range Goal: Glycemic Management Optimized   Start Date: 06/15/2020  Expected End Date: 12/15/2020  This Visit's Progress: On track  Priority: High  Note:   Objective:  Lab Results  Component Value Date   HGBA1C 7.9 (A) 04/05/2020   Lab Results  Component Value Date   CREATININE 0.80 11/25/2019   CREATININE 0.76 07/29/2019   CREATININE 0.72 07/01/2019   No results found for: EGFR Current Barriers:  Knowledge Deficits related to basic Diabetes pathophysiology and self care/management- pt recently placed on metformin BID with elevated CBG most likely form prednisone  usage (per notes) Does not use cbg meter - patient reports he is currently not checking CBG and not instructed to do so, will follow up with primary care provider on 07/05/20 and have another [facility] checked and talk with doctor about plan going forward Does not adhere to provider recommendations re: ADA diet, pt eats what he wants and continues to drink sugary soft drinks Does not contact provider office for questions/concerns- patient does not always stay in touch with doctor for problems/ issues that may arise Case Manager Clinical Goal(s):  patient will demonstrate improved adherence to prescribed treatment plan for diabetes self care/management as evidenced by: adherence to ADA/ carb modified diet, call provider with any new/ worsening symptoms Interventions:  Collaboration with Fayrene Helper, MD regarding development and update of comprehensive plan of care as evidenced by provider attestation and co-signature Inter-disciplinary care team collaboration (see longitudinal plan of care) Provided education to patient about basic DM disease process Reviewed medications with patient and discussed importance of medication adherence Discussed plans with patient for ongoing care management follow up and provided patient with direct contact information for care management team Provided patient with written educational materials related to hypo and hyperglycemia and importance of correct treatment- mailed to patient's home Reviewed scheduled/upcoming provider appointments including: 07/05/20 with primary care provider Review of patient status, including review of consultants reports, relevant laboratory and other test results, and medications completed. Reviewed importance of being mindful of carbohydrate intake and avoiding/ limiting concentrated sweets and sugary soft drinks Reviewed portion control Encouraged pt to continue walking Self-Care Activities - UNABLE to independently drive, rides Moped  and has friend that transports pt at times Attends all scheduled provider appointments Adheres to prescribed ADA/carb modified Patient Goals: - check blood sugar at prescribed times- you are currently not checking and will follow up with primary doctor on 6/29 with another Hgb AIC check and further instructions - Try to limit or stop drinking sugary drinks - Be mindful of carbohydrate intake at each meal- too much rice, pasta, bread, starchy food will increase your blood sugar - avoid/ limit concentrated sweets such as cookies, pie, cake, etc. - take metformin as prescribed - please look over handout mailed to you about low blood sugar and actions to take Follow Up Plan: Telephone follow up appointment with care management team member scheduled for:  07/13/2020      Plan: Telephone outreach scheduled for 07/13/2020  Jacqlyn Larsen [facility], BSN RN Case Manager [facility] [phone]

## 2020-06-24 ENCOUNTER — Other Ambulatory Visit: Payer: Self-pay | Admitting: Family Medicine

## 2020-06-25 DIAGNOSIS — J449 Chronic obstructive pulmonary disease, unspecified: Secondary | ICD-10-CM | POA: Diagnosis not present

## 2020-06-26 ENCOUNTER — Telehealth: Payer: Self-pay | Admitting: Pulmonary Disease

## 2020-06-26 ENCOUNTER — Other Ambulatory Visit: Payer: Self-pay | Admitting: *Deleted

## 2020-06-26 DIAGNOSIS — J449 Chronic obstructive pulmonary disease, unspecified: Secondary | ICD-10-CM

## 2020-06-26 DIAGNOSIS — I10 Essential (primary) hypertension: Secondary | ICD-10-CM

## 2020-06-26 MED ORDER — AMLODIPINE BESYLATE 5 MG PO TABS: 5.0000 mg | ORAL_TABLET | Freq: Every day | ORAL | 1 refills | Status: DC

## 2020-06-26 NOTE — Telephone Encounter (Signed)
Noted. Pharm updated.

## 2020-06-30 ENCOUNTER — Other Ambulatory Visit: Payer: Self-pay

## 2020-06-30 MED ORDER — PREDNISONE 5 MG PO TABS
5.0000 mg | ORAL_TABLET | Freq: Every day | ORAL | 2 refills | Status: DC
Start: 2020-06-30 — End: 2020-11-16

## 2020-07-03 ENCOUNTER — Other Ambulatory Visit: Payer: Self-pay | Admitting: *Deleted

## 2020-07-03 DIAGNOSIS — J309 Allergic rhinitis, unspecified: Secondary | ICD-10-CM

## 2020-07-03 MED ORDER — FLUTICASONE PROPIONATE 50 MCG/ACT NA SUSP: 2.0000 | Freq: Every day | NASAL | 6 refills | Status: DC

## 2020-07-05 ENCOUNTER — Encounter: Payer: Self-pay | Admitting: Family Medicine

## 2020-07-05 ENCOUNTER — Ambulatory Visit (INDEPENDENT_AMBULATORY_CARE_PROVIDER_SITE_OTHER): Payer: Medicare HMO | Admitting: Family Medicine

## 2020-07-05 ENCOUNTER — Other Ambulatory Visit: Payer: Self-pay

## 2020-07-05 VITALS — BP 146/80 | HR 114 | Resp 17 | Ht 67.0 in | Wt 124.0 lb

## 2020-07-05 DIAGNOSIS — R000 Tachycardia, unspecified: Secondary | ICD-10-CM

## 2020-07-05 DIAGNOSIS — E785 Hyperlipidemia, unspecified: Secondary | ICD-10-CM | POA: Diagnosis not present

## 2020-07-05 DIAGNOSIS — I10 Essential (primary) hypertension: Secondary | ICD-10-CM

## 2020-07-05 DIAGNOSIS — E119 Type 2 diabetes mellitus without complications: Secondary | ICD-10-CM | POA: Diagnosis not present

## 2020-07-05 DIAGNOSIS — R972 Elevated prostate specific antigen [PSA]: Secondary | ICD-10-CM | POA: Diagnosis not present

## 2020-07-05 DIAGNOSIS — E559 Vitamin D deficiency, unspecified: Secondary | ICD-10-CM

## 2020-07-05 DIAGNOSIS — R739 Hyperglycemia, unspecified: Secondary | ICD-10-CM

## 2020-07-05 NOTE — Patient Instructions (Addendum)
F/U end August, call if you need me sooner  Increase maxzide to one and a half daily, as prescribed, blood pressure is high Nurse will give you the extra half tablet in the office today  Please schedule appointment with pulmonary at checkout , needs to be seen around July, per last visit  You DO NEED your 2nd Covid Booster ,  please go to your pharmacy for this  Nurse please get report of shingles vaccines from [place] and enter  Please get fasting CBC, lipid, cmp and EGFr, tSH, HBA1c, TSH, vit D, and PSA mid August  Thanks for choosing [facility], we consider it a privelige to serve you.

## 2020-07-06 ENCOUNTER — Other Ambulatory Visit: Payer: Self-pay | Admitting: Internal Medicine

## 2020-07-06 MED ORDER — BREZTRI AEROSPHERE 160-9-4.8 MCG/ACT IN AERO: 2.0000 | INHALATION_SPRAY | Freq: Two times a day (BID) | RESPIRATORY_TRACT | 0 refills | Status: DC

## 2020-07-09 ENCOUNTER — Encounter: Payer: Self-pay | Admitting: Family Medicine

## 2020-07-09 NOTE — Assessment & Plan Note (Signed)
Patient educated about the importance of limiting  Carbohydrate intake , the need to commit to daily physical activity for a minimum of 30 minutes , and to commit weight loss. The fact that changes in all these areas will reduce or eliminate all together the development of diabetes is stressed.   Diabetic Labs Latest Ref Rng & Units 04/05/2020 11/25/2019 07/29/2019 07/09/2019 07/09/2019  HbA1c 0.0 - 7.0 % 7.9(A) - - 6.0 6.0  Chol <200 mg/dL - - - - -  HDL > OR = 40 mg/dL - - - - -  Calc LDL mg/dL (calc) - - - - -  Triglycerides <150 mg/dL - - - - -  Creatinine 0.61 - 1.24 mg/dL - 0.80 0.76 - -   BP/Weight 07/05/2020 04/06/2020 04/05/2020 02/07/2020 12/13/2019 12/01/2019 22/63/3354  Systolic BP 562 563 893 734 287 681 157  Diastolic BP 80 90 93 88 77 82 82  Wt. (Lbs) 124 129.2 130.12 131.2 133.4 124 124.4  BMI 19.42 20.24 20.38 20.55 20.89 19.42 19.48   Foot/eye exam completion dates Latest Ref Rng & Units 06/06/2020 06/06/2020  Eye Exam No Retinopathy No Retinopathy No Retinopathy  Foot Form Completion - - -

## 2020-07-09 NOTE — Assessment & Plan Note (Signed)
[name] is reminded of the importance of commitment to daily physical activity for 30 minutes or more, as able and the need to limit carbohydrate intake to 30 to 60 grams per meal to help with blood sugar control.   The need to take medication as prescribed, test blood sugar as directed, and to call between visits if there is a concern that blood sugar is uncontrolled is also discussed.   [name] is reminded of the importance of daily foot exam, annual eye examination, and good blood sugar, blood pressure and cholesterol control.  Diabetic Labs Latest Ref Rng & Units 04/05/2020 11/25/2019 07/29/2019 07/09/2019 07/09/2019  HbA1c 0.0 - 7.0 % 7.9(A) - - 6.0 6.0  Chol <200 mg/dL - - - - -  HDL > OR = 40 mg/dL - - - - -  Calc LDL mg/dL (calc) - - - - -  Triglycerides <150 mg/dL - - - - -  Creatinine 0.61 - 1.24 mg/dL - 0.80 0.76 - -   BP/Weight 07/05/2020 04/06/2020 04/05/2020 02/07/2020 12/13/2019 12/01/2019 84/16/6063  Systolic BP 016 010 932 355 732 202 542  Diastolic BP 80 90 93 88 77 82 82  Wt. (Lbs) 124 129.2 130.12 131.2 133.4 124 124.4  BMI 19.42 20.24 20.38 20.55 20.89 19.42 19.48   Foot/eye exam completion dates Latest Ref Rng & Units 06/06/2020 06/06/2020  Eye Exam No Retinopathy No Retinopathy No Retinopathy  Foot Form Completion - - -

## 2020-07-09 NOTE — Progress Notes (Signed)
[name]     [name]: [id number]      DOB: 1950-07-26   HPI [name] is here for follow up and re-evaluation of chronic medical conditions, medication management and review of any available recent lab and radiology data.  Preventive health is updated, specifically  Cancer screening and Immunization.   C/o ineffectiveness of his new MDI and has upcoming Pulmonary f/u , c/o increased wheezing and SOB.  There are no new concerns.  There are no specific complaints   ROS Denies recent fever or chills. Denies sinus pressure, nasal congestion, ear pain or sore throat. Denies chest pains, palpitations and leg swelling Denies abdominal pain, nausea, vomiting,diarrhea or constipation.   Denies dysuria, frequency, hesitancy or incontinence. Denies joint pain, swelling and limitation in mobility. Denies headaches, seizures, numbness, or tingling. Denies depression, anxiety or insomnia. Denies skin break down or rash.   PE  BP (!) 146/80   Pulse (!) 114   Resp 17   Ht 5\' 7"  (1.702 m)   Wt 124 lb (56.2 kg)   SpO2 91%   BMI 19.42 kg/m   Patient alert and oriented and in no cardiopulmonary distress.  HEENT: No facial asymmetry, EOMI,     Neck supple .  Chest: Clear to auscultation bilaterally.  CVS: S1, S2 no murmurs, no S3.Regular rate.  ABD: Soft non tender.   Ext: No edema  MS: Adequate ROM spine, shoulders, hips and knees.  Skin: Intact, no ulcerations or rash noted.  Psych: Good eye contact, normal affect. Memory intact not anxious or depressed appearing.  CNS: CN 2-12 intact, power,  normal throughout.no focal deficits noted.   Assessment & Plan  Essential hypertension Uncontrolled inc maxzide dose and re eval in 6 to 8 weeks DASH diet and commitment to daily physical activity for a minimum of 30 minutes discussed and encouraged, as a part of hypertension management. The importance of attaining a healthy weight is also discussed.  BP/Weight 07/05/2020 04/06/2020  04/05/2020 02/07/2020 12/13/2019 12/01/2019 00/93/8182  Systolic BP 993 716 967 893 810 175 102  Diastolic BP 80 90 93 88 77 82 82  Wt. (Lbs) 124 129.2 130.12 131.2 133.4 124 124.4  BMI 19.42 20.24 20.38 20.55 20.89 19.42 19.48       Hyperglycemia Patient educated about the importance of limiting  Carbohydrate intake , the need to commit to daily physical activity for a minimum of 30 minutes , and to commit weight loss. The fact that changes in all these areas will reduce or eliminate all together the development of diabetes is stressed.   Diabetic Labs Latest Ref Rng & Units 04/05/2020 11/25/2019 07/29/2019 07/09/2019 07/09/2019  HbA1c 0.0 - 7.0 % 7.9(A) - - 6.0 6.0  Chol <200 mg/dL - - - - -  HDL > OR = 40 mg/dL - - - - -  Calc LDL mg/dL (calc) - - - - -  Triglycerides <150 mg/dL - - - - -  Creatinine 0.61 - 1.24 mg/dL - 0.80 0.76 - -   BP/Weight 07/05/2020 04/06/2020 04/05/2020 02/07/2020 12/13/2019 12/01/2019 58/52/7782  Systolic BP 423 536 144 315 400 867 619  Diastolic BP 80 90 93 88 77 82 82  Wt. (Lbs) 124 129.2 130.12 131.2 133.4 124 124.4  BMI 19.42 20.24 20.38 20.55 20.89 19.42 19.48   Foot/eye exam completion dates Latest Ref Rng & Units 06/06/2020 06/06/2020  Eye Exam No Retinopathy No Retinopathy No Retinopathy  Foot Form Completion - - -  Hyperlipidemia Hyperlipidemia:Low fat diet discussed and encouraged.   Lipid Panel  Lab Results  Component Value Date   CHOL 189 11/12/2018   HDL 83 11/12/2018   LDLCALC 93 11/12/2018   TRIG 51 11/12/2018   CHOLHDL 2.3 11/12/2018     Updated lab needed at/ before next visit.   DM (diabetes mellitus) [facility]) [name] is reminded of the importance of commitment to daily physical activity for 30 minutes or more, as able and the need to limit carbohydrate intake to 30 to 60 grams per meal to help with blood sugar control.   The need to take medication as prescribed, test blood sugar as directed, and to call between visits if  there is a concern that blood sugar is uncontrolled is also discussed.   [name] is reminded of the importance of daily foot exam, annual eye examination, and good blood sugar, blood pressure and cholesterol control.  Diabetic Labs Latest Ref Rng & Units 04/05/2020 11/25/2019 07/29/2019 07/09/2019 07/09/2019  HbA1c 0.0 - 7.0 % 7.9(A) - - 6.0 6.0  Chol <200 mg/dL - - - - -  HDL > OR = 40 mg/dL - - - - -  Calc LDL mg/dL (calc) - - - - -  Triglycerides <150 mg/dL - - - - -  Creatinine 0.61 - 1.24 mg/dL - 0.80 0.76 - -   BP/Weight 07/05/2020 04/06/2020 04/05/2020 02/07/2020 12/13/2019 12/01/2019 49/82/6415  Systolic BP 830 940 768 088 110 315 945  Diastolic BP 80 90 93 88 77 82 82  Wt. (Lbs) 124 129.2 130.12 131.2 133.4 124 124.4  BMI 19.42 20.24 20.38 20.55 20.89 19.42 19.48   Foot/eye exam completion dates Latest Ref Rng & Units 06/06/2020 06/06/2020  Eye Exam No Retinopathy No Retinopathy No Retinopathy  Foot Form Completion - - -

## 2020-07-09 NOTE — Assessment & Plan Note (Signed)
Hyperlipidemia:Low fat diet discussed and encouraged.   Lipid Panel  Lab Results  Component Value Date   CHOL 189 11/12/2018   HDL 83 11/12/2018   LDLCALC 93 11/12/2018   TRIG 51 11/12/2018   CHOLHDL 2.3 11/12/2018     Updated lab needed at/ before next visit.

## 2020-07-09 NOTE — Assessment & Plan Note (Signed)
Uncontrolled inc maxzide dose and re eval in 6 to 8 weeks DASH diet and commitment to daily physical activity for a minimum of 30 minutes discussed and encouraged, as a part of hypertension management. The importance of attaining a healthy weight is also discussed.  BP/Weight 07/05/2020 04/06/2020 04/05/2020 02/07/2020 12/13/2019 12/01/2019 53/79/4327  Systolic BP 614 709 295 747 340 370 964  Diastolic BP 80 90 93 88 77 82 82  Wt. (Lbs) 124 129.2 130.12 131.2 133.4 124 124.4  BMI 19.42 20.24 20.38 20.55 20.89 19.42 19.48

## 2020-07-13 ENCOUNTER — Ambulatory Visit (INDEPENDENT_AMBULATORY_CARE_PROVIDER_SITE_OTHER): Payer: Medicare HMO | Admitting: *Deleted

## 2020-07-13 ENCOUNTER — Telehealth: Payer: Self-pay | Admitting: *Deleted

## 2020-07-13 DIAGNOSIS — E119 Type 2 diabetes mellitus without complications: Secondary | ICD-10-CM | POA: Diagnosis not present

## 2020-07-13 DIAGNOSIS — I10 Essential (primary) hypertension: Secondary | ICD-10-CM

## 2020-07-13 DIAGNOSIS — J449 Chronic obstructive pulmonary disease, unspecified: Secondary | ICD-10-CM

## 2020-07-13 NOTE — Telephone Encounter (Signed)
  Care Management   Follow Up Note   07/13/2020 Name: [name] [name]: [id number] DOB: [birth year]   Referred by: Fayrene Helper, MD Reason for referral : Chronic Care Management (DM, HTN, COPD)   An unsuccessful telephone outreach was attempted today. The patient was referred to the case management team for assistance with care management and care coordination.   Follow Up Plan: Patient called RN care manager back.  See today's note.  Jacqlyn Larsen [facility], BSN RN Case Manager [facility] [phone]

## 2020-07-13 NOTE — Patient Instructions (Signed)
Visit Information  PATIENT GOALS:  Goals Addressed             This Visit's Progress    Monitor and Manage My Blood Sugar-Diabetes Type 2       Timeframe:  Long-Range Goal Priority:  High Start Date:           06/15/2020                  Expected End Date:     12/15/2020                  Follow Up Date- 08/17/2020   - check blood sugar at prescribed times- you are currently not checking-  RN care manager will check on glucometer, have your friend help you with checking blood sugar - Try to limit or stop drinking sugary drinks - Be mindful of carbohydrate intake at each meal- too much rice, pasta, bread, starchy food will increase your blood sugar - avoid/ limit concentrated sweets such as cookies, pie, cake, etc. - take metformin as prescribed - Limit fast food - try to exercise/ walk some everyday   Why is this important?   Checking your blood sugar at home helps to keep it from getting very high or very low.  Writing the results in a diary or log helps the doctor know how to care for you.  Your blood sugar log should have the time, date and the results.  Also, write down the amount of insulin or other medicine that you take.  Other information, like what you ate, exercise done and how you were feeling, will also be helpful.     Notes:       Track and Manage My Blood Pressure-Hypertension       Timeframe:  Long-Range Goal Priority:  High Start Date:     02/24/2020                        Expected End Date:      08/23/2020                 Follow Up Date 08/17/2020   Your blood pressure continues to be elevated Choose a place to take my blood pressure (home, clinic or office, retail store)  Take medications as prescribed and keep refilled on time Avoid/limit salty foods such as chips, fast food, canned soups, etc. Continue to ask your friend to look at food labels for you to see salt content Choose fresh or frozen foods as much as you can Follow up with primary doctor  08/28/20 Call RN care manager if you have any questions at [phone]   Why is this important?   You won't feel high blood pressure, but it can still hurt your blood vessels.  High blood pressure can cause heart or kidney problems. It can also cause a stroke.  Making lifestyle changes like losing a little weight or eating less salt will help.  Checking your blood pressure at home and at different times of the day can help to control blood pressure.  If the doctor prescribes medicine remember to take it the way the doctor ordered.  Call the office if you cannot afford the medicine or if there are questions about it.     Notes:       Track and Manage My Symptoms-COPD       Timeframe:  Long-Range Goal Priority:  High Start Date:  02/24/2020                       Expected End Date:   08/23/2020                    Follow Up Date 08/17/2020 Continue to ask friends for help as needed Alternate activity with rest promotes energy conservation and keeps you from getting too tired Try to continue walking inside stores Continue to follow rescue, be mindful of how you are feeling each day call your doctor for any change in symptoms such as coughing more than usual, inhalers are not helping or overall not feeling well Follow up with Dr. Halford Chessman (lung doctor) as needed and Dr. Moshe Cipro on 08/28/20 Continue deep breathing exercises daily Continue wearing a mask as needed Be mindful of the hot, humid weather and stay indoors as needed especially during mid-day to early afternoon. Use oxygen as needed and only as prescribed- do not increase 02 rate unless you discuss with your doctor first Be mindful of oxygen tubing so that you do not trip      Why is this important?   Tracking your symptoms and other information about your health helps your doctor plan your care.  Write down the symptoms, the time of day, what you were doing and what medicine you are taking.  You will soon learn how to manage your  symptoms.     Notes:          The patient verbalized understanding of instructions, educational materials, and care plan provided today and declined offer to receive copy of patient instructions, educational materials, and care plan.   Telephone follow up appointment with care management team member scheduled for:  08/17/2020  Jacqlyn Larsen [facility], BSN RN Case Manager [facility] [phone]

## 2020-07-13 NOTE — Chronic Care Management (AMB) (Signed)
Chronic Care Management   CCM RN Visit Note  07/13/2020 Name: [name] [name]: [id number] DOB: 14-Nov-1950  Subjective: [name] is a 70 y.o. year old male who is a primary care patient of [name], MD. The care management team was consulted for assistance with disease management and care coordination needs.    Engaged with patient by telephone for follow up visit in response to provider referral for case management and/or care coordination services.   Consent to Services:  The patient was given information about Chronic Care Management services, agreed to services, and gave verbal consent prior to initiation of services.  Please see initial visit note for detailed documentation.   Patient agreed to services and verbal consent obtained.   Assessment: Review of patient past medical history, allergies, medications, health status, including review of consultants reports, laboratory and other test data, was performed as part of comprehensive evaluation and provision of chronic care management services.   SDOH (Social Determinants of Health) assessments and interventions performed:    CCM Care Plan  No Known Allergies  Outpatient Encounter Medications as of 07/13/2020  Medication Sig   acetaminophen (TYLENOL) 500 MG tablet Take 500 mg by mouth every 6 (six) hours as needed. Takes 1 tablet "almost" every day   albuterol (PROVENTIL) (2.5 MG/3ML) 0.083% nebulizer solution Take 3 mLs (2.5 mg total) by nebulization every 6 (six) hours as needed for wheezing or shortness of breath.   albuterol (VENTOLIN HFA) 108 (90 Base) MCG/ACT inhaler INHALE 2 PUFFS INTO THE LUNGS EVERY FOUR HOURS AS NEEDED. ONLY IF YOU CAN'T CATCH YOUR BREATH.   amLODipine (NORVASC) 5 MG tablet Take 1 tablet (5 mg total) by mouth daily.   aspirin 325 MG EC tablet Take 325 mg by mouth daily.   Budeson-Glycopyrrol-Formoterol (BREZTRI AEROSPHERE) 160-9-4.8 MCG/ACT AERO Take 2 puffs first thing in am and then  another 2 puffs about 12 hours later.   fluticasone (FLONASE) 50 MCG/ACT nasal spray Place 2 sprays into both nostrils daily.   metFORMIN (GLUCOPHAGE) 500 MG tablet Take 1 tablet (500 mg total) by mouth 2 (two) times daily with a meal.   pravastatin (PRAVACHOL) 40 MG tablet TAKE 1 TABLET AT BEDTIME   predniSONE (DELTASONE) 5 MG tablet Take 1 tablet (5 mg total) by mouth daily with breakfast.   triamterene-hydrochlorothiazide (MAXZIDE-25) 37.5-25 MG tablet TAKE 1 AND 1/2 TABLETS EVERY DAY   Budeson-Glycopyrrol-Formoterol (BREZTRI AEROSPHERE) 160-9-4.8 MCG/ACT AERO Inhale 2 puffs into the lungs in the morning and at bedtime. (Patient not taking: Reported on 07/13/2020)   [DISCONTINUED] simvastatin (ZOCOR) 40 MG tablet Take 40 mg by mouth daily.   No facility-administered encounter medications on file as of 07/13/2020.    Patient Active Problem List   Diagnosis Date Noted   Rhinitis, chronic 06/10/2019   Pulmonary infiltrates on CXR 05/29/2019   COPD GOLD IV/ group D  05/11/2019   Low oxygen saturation 03/23/2019   Food insecurity 11/22/2018   DOE (dyspnea on exertion) 04/18/2018   Glucose intolerance 04/18/2018   Social isolation 03/17/2013   Asthma with COPD (chronic obstructive pulmonary disease) ([place]) 11/12/2012   Tachycardia 11/05/2010   DM (diabetes mellitus) ([place]) 10/26/2009   Vitamin D deficiency 06/28/2009   IGT (impaired glucose tolerance) 01/25/2009   PSA, INCREASED 01/25/2009   Hyperlipidemia 07/30/2007   Essential hypertension 07/30/2007    Conditions to be addressed/monitored:HTN, COPD, and DMII  Care Plan : COPD (Adult)  Updates made by Kassie Mends, RN since 07/13/2020 12:00  AM     Problem: Disease Progression (COPD)   Priority: High     Long-Range Goal: Disease Progression Minimized or Managed   Start Date: 02/24/2020  Expected End Date: 08/22/2020  This Visit's Progress: On track  Recent Progress: On track  Priority: High  Note:   Timeframe:  Long-Range  Goal Priority:  High Start Date:      02/24/2020                       Expected End Date:       08/23/2020  Current Barriers:  Knowledge deficits related to basic understanding of COPD disease process and importance of oxygen management and safety Knowledge deficits related to basic COPD self care/management- continues to need education/ reinforcement for COPD action plan and importance of calling doctor for symptom management Literacy barriers- patient has friend that reads his mail etc for him Transportation barriers- patient rides moped to appointments, shopping, etc.- has friend who provides transportation at times Limited Social Support- adult children live out of state, patient is widowed, has friend Audiological scientist) that helps him Microbiologist - patient does not go to church or other groups (due respiratory issues) Case Manager Clinical Goal(s): Patient will verbalize understanding of COPD action plan and when to seek appropriate levels of medical care Patient will verbalize basic understanding of COPD disease process and self care activities  Interventions:  Collaboration with Fayrene Helper, MD regarding development and update of comprehensive plan of care as evidenced by provider attestation and co-signature Inter-disciplinary care team collaboration (see longitudinal plan of care) Reinforced with  patient COPD education/ reinforcement on self care/management/and exacerbation prevention Reinforced COPD action plan and importance of daily self assessment and calling doctor promptly as needed Reviewed instruction about proper use of medications used for management of COPD including inhalers Reviewed importance of the role of exercise in the management of COPD and encouraged pt to continue being active, walking as tolerated indoors at stores as he has been doing Reviewed triggers for COPD exacerbation such as environmental stimulants (pollen, smoke, etc) and to avoid, reinforced  wearing mask as needed Reinforced oxygen safety tips (no one is to smoke around oxygen, keep flow rate as ordered by doctor, be mindful of long tubing). Patient Goals/ Self Care Activities: Continue to ask friends for help as needed Alternate activity with rest promotes energy conservation and keeps you from getting too tired Try to continue walking inside stores Continue to follow rescue, be mindful of how you are feeling each day call your doctor for any change in symptoms such as coughing more than usual, inhalers are not helping or overall not feeling well Follow up with Dr. Halford Chessman (lung doctor) as needed and Dr. Moshe Cipro on 08/28/20 Continue deep breathing exercises daily Continue wearing a mask as needed Be mindful of the hot, humid weather and stay indoors as needed especially during mid-day to early afternoon. Use oxygen as needed and only as prescribed- do not increase 02 rate unless you discuss with your doctor first Be mindful of oxygen tubing so that you do not trip  Follow up plan: by telephone outreach- 08/17/2020    Care Plan : Hypertension (Adult)  Updates made by Kassie Mends, RN since 07/13/2020 12:00 AM     Problem: Disease Progression (Hypertension)   Priority: High     Long-Range Goal: Disease Progression Prevented or Minimized   Start Date: 02/24/2020  Expected End Date: 08/22/2020  This Visit's Progress:  Not on track  Recent Progress: Not on track  Priority: High  Note:   Timeframe:  Long-Range Goal Priority:  High Start Date:         02/24/2020                    Expected End Date:  08/23/2020  Objective:  Last practice recorded BP readings:  BP Readings from Last 3 Encounters:  02/07/20 (!) 148/88  12/13/19 135/77  12/01/19 (!) 142/82   Most recent eGFR/CrCl: No results found for: EGFR  No components found for: CRCL Current Barriers:  Knowledge Deficits related to basic understanding of hypertension pathophysiology and self care management- patient does  not check blood pressure at home, will continue to have checked at [place] office or retail store, pt rides Moped to appointments and has friend that assists with transportation if needed, pt is aware of [employer] transportation service. Literacy barriers- patient cannot read but has a friend (neighbor) who helps him read anything mailed to his home Limited Social Support- adult children live out of state, patient is widowed, does not want to go to assisted living at this time (as previously considered)  [place]- patient verbalizes places he could go to activities such as church, but due to health issues/ breathing he chooses not to. Case Manager Clinical Goal(s):  patient will verbalize understanding of plan for hypertension management  patient will attend all scheduled medical appointments: pt saw  Dr. Moshe Cipro 07/05/20, saw Dr. Halford Chessman 04/06/20 Interventions:  Collaboration with Fayrene Helper, MD regarding development and update of comprehensive plan of care as evidenced by provider attestation and co-signature Inter-disciplinary care team collaboration (see longitudinal plan of care) Evaluation of current treatment plan related to hypertension self management and patient's adherence to plan as established by provider. Reviewed education to patient re: stroke prevention, s/s of heart attack and stroke, DASH diet, complications of uncontrolled blood pressure Reinforced choosing fresh and frozen foods if possible, avoid high sodium content foods Reviewed importance of taking medications as prescribed and calling for refills so does not run out Discussed plans with patient for ongoing care management follow up, pt verbalizes he has RN CM contact number Reviewed scheduled/upcoming provider appointments including:  primary care provider, reviewed dates with patient, verbalizes understanding Patient Goals/Self-Care Activities Over the next 30 days patient will- Self administers  medications as prescribed Attends all scheduled provider appointments Calls provider office for new concerns, questions, or BP outside discussed parameters Your blood pressure continues to be elevated Choose a place to take my blood pressure (home, clinic or office, retail store)  Take medications as prescribed and keep refilled on time Avoid/limit salty foods such as chips, fast food, canned soups, etc. Continue to ask your friend to look at food labels for you to see salt content Choose fresh or frozen foods as much as you can Follow up with primary doctor 08/28/20 Call RN care manager if you have any questions at [phone] Follow Up Plan: Telephone follow up appointment with care management team member scheduled for:   08/17/2020    Care Plan : Diabetes Type 2 (Adult)  Updates made by Kassie Mends, RN since 07/13/2020 12:00 AM     Problem: Glycemic Management (Diabetes, Type 2)   Priority: High     Long-Range Goal: Glycemic Management Optimized   Start Date: 06/15/2020  Expected End Date: 12/15/2020  This Visit's Progress: Not on track  Recent Progress: On track  Priority: High  Note:  Objective:  Lab Results  Component Value Date   HGBA1C 7.9 (A) 04/05/2020   Lab Results  Component Value Date   CREATININE 0.80 11/25/2019   CREATININE 0.76 07/29/2019   CREATININE 0.72 07/01/2019   No results found for: EGFR Current Barriers:  Knowledge Deficits related to basic Diabetes pathophysiology and self care/management- pt recently placed on metformin BID with elevated CBG most likely form prednisone usage (per notes) Does not use cbg meter - patient reports he is currently not checking CBG and not instructed to do so, pt followed up with primary care provider 07/05/20 (unable to see results of AIC), pt reports if he had glucometer he would be willing to check CBG with the help of his friend. Does not adhere to provider recommendations re: ADA diet, pt eats what he wants and  continues to drink sugary soft drinks Does not contact provider office for questions/concerns- patient does not always stay in touch with doctor for problems/ issues that may arise Case Manager Clinical Goal(s):  patient will demonstrate improved adherence to prescribed treatment plan for diabetes self care/management as evidenced by: adherence to ADA/ carb modified diet, call provider with any new/ worsening symptoms Interventions:  Collaboration with Fayrene Helper, MD regarding development and update of comprehensive plan of care as evidenced by provider attestation and co-signature- in basket sent to PCP to inquire is patient supposed to be checking CBG and order for glucometer to be sent to [facility]. Inter-disciplinary care team collaboration (see longitudinal plan of care) Reinforced education to patient about basic DM disease process Reviewed medications with patient and discussed importance of medication adherence Discussed plans with patient for ongoing care management follow up and provided patient with direct contact information for care management tea Reviewed scheduled/upcoming provider appointments including: 08/28/20 with primary care provider Review of patient status, including review of consultants reports, relevant laboratory and other test results, and medications completed- AIC result for 6/29 not documented yet Reinforced importance of being mindful of carbohydrate intake and avoiding/ limiting concentrated sweets and sugary soft drinks Reviewed portion control Encouraged pt to continue walking and avoid mid day sun due to hot weather Self-Care Activities - UNABLE to independently drive, rides Moped and has friend that transports pt at times Attends all scheduled provider appointments Adheres to prescribed ADA/carb modified Patient Goals: - check blood sugar at prescribed times- you are currently not checking-  RN care manager will check on glucometer, have your friend help  you with checking blood sugar - Try to limit or stop drinking sugary drinks - Be mindful of carbohydrate intake at each meal- too much rice, pasta, bread, starchy food will increase your blood sugar - avoid/ limit concentrated sweets such as cookies, pie, cake, etc. - take metformin as prescribed - Limit fast food - try to exercise/ walk some everyday Follow Up Plan: Telephone follow up appointment with care management team member scheduled for:   08/17/2020     Plan:Telephone follow up appointment with care management team member scheduled for:  08/17/2020  Jacqlyn Larsen [facility], BSN RN Case Manager [facility] [phone]

## 2020-07-25 DIAGNOSIS — J449 Chronic obstructive pulmonary disease, unspecified: Secondary | ICD-10-CM | POA: Diagnosis not present

## 2020-08-14 DIAGNOSIS — E785 Hyperlipidemia, unspecified: Secondary | ICD-10-CM | POA: Diagnosis not present

## 2020-08-14 DIAGNOSIS — R000 Tachycardia, unspecified: Secondary | ICD-10-CM | POA: Diagnosis not present

## 2020-08-14 DIAGNOSIS — R739 Hyperglycemia, unspecified: Secondary | ICD-10-CM | POA: Diagnosis not present

## 2020-08-14 DIAGNOSIS — E559 Vitamin D deficiency, unspecified: Secondary | ICD-10-CM | POA: Diagnosis not present

## 2020-08-14 DIAGNOSIS — E119 Type 2 diabetes mellitus without complications: Secondary | ICD-10-CM | POA: Diagnosis not present

## 2020-08-14 DIAGNOSIS — I10 Essential (primary) hypertension: Secondary | ICD-10-CM | POA: Diagnosis not present

## 2020-08-14 DIAGNOSIS — R972 Elevated prostate specific antigen [PSA]: Secondary | ICD-10-CM | POA: Diagnosis not present

## 2020-08-15 LAB — CBC WITH DIFFERENTIAL/PLATELET
Basophils Absolute: 0 10*3/uL (ref 0.0–0.2)
Basos: 0 %
EOS (ABSOLUTE): 0.1 10*3/uL (ref 0.0–0.4)
Eos: 1 %
Hematocrit: 39.1 % (ref 37.5–51.0)
Hemoglobin: 13.1 g/dL (ref 13.0–17.7)
Immature Grans (Abs): 0 10*3/uL (ref 0.0–0.1)
Immature Granulocytes: 0 %
Lymphocytes Absolute: 2.7 10*3/uL (ref 0.7–3.1)
Lymphs: 30 %
MCH: 30.5 pg (ref 26.6–33.0)
MCHC: 33.5 g/dL (ref 31.5–35.7)
MCV: 91 fL (ref 79–97)
Monocytes Absolute: 0.8 10*3/uL (ref 0.1–0.9)
Monocytes: 9 %
Neutrophils Absolute: 5.3 10*3/uL (ref 1.4–7.0)
Neutrophils: 60 %
Platelets: 207 10*3/uL (ref 150–450)
RBC: 4.29 x10E6/uL (ref 4.14–5.80)
RDW: 11.9 % (ref 11.6–15.4)
WBC: 8.9 10*3/uL (ref 3.4–10.8)

## 2020-08-15 LAB — LIPID PANEL
Chol/HDL Ratio: 2.1 ratio (ref 0.0–5.0)
Cholesterol, Total: 176 mg/dL (ref 100–199)
HDL: 82 mg/dL
LDL Chol Calc (NIH): 83 mg/dL (ref 0–99)
Triglycerides: 56 mg/dL (ref 0–149)
VLDL Cholesterol Cal: 11 mg/dL (ref 5–40)

## 2020-08-15 LAB — CMP14+EGFR
ALT: 15 IU/L (ref 0–44)
AST: 12 IU/L (ref 0–40)
Albumin/Globulin Ratio: 2.1 (ref 1.2–2.2)
Albumin: 4.1 g/dL (ref 3.7–4.7)
Alkaline Phosphatase: 74 IU/L (ref 44–121)
BUN/Creatinine Ratio: 9 — ABNORMAL LOW (ref 10–24)
BUN: 8 mg/dL (ref 8–27)
Bilirubin Total: 0.5 mg/dL (ref 0.0–1.2)
CO2: 28 mmol/L (ref 20–29)
Calcium: 9.8 mg/dL (ref 8.6–10.2)
Chloride: 95 mmol/L — ABNORMAL LOW (ref 96–106)
Creatinine, Ser: 0.85 mg/dL (ref 0.76–1.27)
Globulin, Total: 2 g/dL (ref 1.5–4.5)
Glucose: 122 mg/dL — ABNORMAL HIGH (ref 65–99)
Potassium: 3.8 mmol/L (ref 3.5–5.2)
Sodium: 140 mmol/L (ref 134–144)
Total Protein: 6.1 g/dL (ref 6.0–8.5)
eGFR: 92 mL/min/{1.73_m2} (ref >59)

## 2020-08-15 LAB — PSA: Prostate Specific Ag, Serum: 9.5 ng/mL — ABNORMAL HIGH (ref 0.0–4.0)

## 2020-08-15 LAB — VITAMIN D 25 HYDROXY (VIT D DEFICIENCY, FRACTURES): Vit D, 25-Hydroxy: 21.2 ng/mL — ABNORMAL LOW (ref 30.0–100.0)

## 2020-08-15 LAB — HEMOGLOBIN A1C
Est. average glucose Bld gHb Est-mCnc: 134 mg/dL
Hgb A1c MFr Bld: 6.3 % — ABNORMAL HIGH (ref 4.8–5.6)

## 2020-08-15 LAB — TSH: TSH: 2.68 u[IU]/mL (ref 0.450–4.500)

## 2020-08-17 ENCOUNTER — Telehealth: Payer: Medicare HMO

## 2020-08-17 ENCOUNTER — Telehealth: Payer: Self-pay | Admitting: *Deleted

## 2020-08-17 NOTE — Telephone Encounter (Signed)
  Care Management   Follow Up Note   08/17/2020 Name: [name] [name]: [id number] DOB: Apr 05, 1950   Referred by: Fayrene Helper, MD Reason for referral : Chronic Care Management (COPD, HTN)   A second unsuccessful telephone outreach was attempted today. The patient was referred to the case management team for assistance with care management and care coordination.   Follow Up Plan: Telephone follow up appointment with care management team member scheduled for:  upon rescheduling by care guide.  Jacqlyn Larsen [facility], BSN RN Case Manager [facility] [phone]

## 2020-08-20 ENCOUNTER — Other Ambulatory Visit: Payer: Self-pay | Admitting: Family Medicine

## 2020-08-20 DIAGNOSIS — R972 Elevated prostate specific antigen [PSA]: Secondary | ICD-10-CM

## 2020-08-20 NOTE — Progress Notes (Signed)
Amb urology re elevated PSa

## 2020-08-21 ENCOUNTER — Other Ambulatory Visit: Payer: Self-pay | Admitting: Family Medicine

## 2020-08-25 DIAGNOSIS — J449 Chronic obstructive pulmonary disease, unspecified: Secondary | ICD-10-CM | POA: Diagnosis not present

## 2020-08-28 ENCOUNTER — Ambulatory Visit: Payer: Medicare HMO | Admitting: Family Medicine

## 2020-09-05 ENCOUNTER — Other Ambulatory Visit: Payer: Self-pay

## 2020-09-05 ENCOUNTER — Ambulatory Visit: Payer: Medicare HMO | Admitting: Urology

## 2020-09-05 ENCOUNTER — Encounter: Payer: Self-pay | Admitting: Urology

## 2020-09-05 VITALS — BP 145/73 | HR 116

## 2020-09-05 DIAGNOSIS — R972 Elevated prostate specific antigen [PSA]: Secondary | ICD-10-CM | POA: Diagnosis not present

## 2020-09-05 LAB — URINALYSIS, ROUTINE W REFLEX MICROSCOPIC
Bilirubin, UA: NEGATIVE
Glucose, UA: NEGATIVE
Ketones, UA: NEGATIVE
Leukocytes,UA: NEGATIVE
Nitrite, UA: NEGATIVE
Protein,UA: NEGATIVE
RBC, UA: NEGATIVE
Specific Gravity, UA: 1.02 (ref 1.005–1.030)
Urobilinogen, Ur: 0.2 mg/dL (ref 0.2–1.0)
pH, UA: 7.5 (ref 5.0–7.5)

## 2020-09-05 MED ORDER — LEVOFLOXACIN 750 MG PO TABS: 750.0000 mg | ORAL_TABLET | Freq: Every day | ORAL | 0 refills | Status: AC

## 2020-09-05 NOTE — Progress Notes (Signed)
H&P  Chief Complaint: Elevated PSA  History of Present Illness: 70 year old male, apparently with a history of prior prostate issues, is sent by Dr. Moshe Cipro for evaluation and management of elevated PSA.  PSA levels are as follows:  4.1.2014-6.19 3.15.2016--8.75 11.13.2019--7.9 8.8.2022--9.5  The patient thinks that he may have had an ultrasound and biopsy before in this office-that would have been before 2015 with the prior urologist here in [place].  He denies significant lower urinary tract symptoms.  No gross hematuria.  No recent urinary tract infections.  Past Medical History:  Diagnosis Date   Asthma    CAP (community acquired pneumonia) 12/10/2018   COPD (chronic obstructive pulmonary disease) ([place])    COPD with acute exacerbation ([place]) 04/18/2018   CVA (cerebral vascular accident) ([place]) 2008   with temporary vision loss    Depression    ECHOCARDIOGRAM, ABNORMAL 03/06/2010   Qualifier: Diagnosis of  By: Purcell Nails, NP, Kathryn     Elevated PSA 2014   no diagnosis pf prostate cancer in 07/2014   History of substance abuse ([place]) 01/14/2011   Marijuana   History of substance abuse ([place]) 01/14/2011   History of tobacco abuse 01/14/2011   Hyperlipidemia    Hypertension    Lobar pneumonia ([place]) 12/13/2018   Marijuana abuse    Nicotine addiction    Prediabetes 2014    No past surgical history on file.  Home Medications:  Allergies as of 09/05/2020   No Known Allergies      Medication List        Accurate as of September 05, 2020  8:40 AM. If you have any questions, ask your nurse or doctor.          acetaminophen 500 MG tablet Commonly known as: TYLENOL Take 500 mg by mouth every 6 (six) hours as needed. Takes 1 tablet "almost" every day   albuterol 108 (90 Base) MCG/ACT inhaler Commonly known as: VENTOLIN HFA INHALE 2 PUFFS INTO THE LUNGS EVERY FOUR HOURS AS NEEDED. ONLY IF YOU CAN'T CATCH YOUR BREATH.   albuterol (2.5 MG/3ML) 0.083% nebulizer  solution Commonly known as: PROVENTIL INHALE THE CONTENTS OF 1 VIAL VIA NEBULIZER EVERY 6 (SIX) HOURS AS NEEDED FOR WHEEZING OR SHORTNESS OF BREATH.   amLODipine 5 MG tablet Commonly known as: NORVASC Take 1 tablet (5 mg total) by mouth daily.   aspirin 325 MG EC tablet Take 325 mg by mouth daily.   Breztri Aerosphere 160-9-4.8 MCG/ACT Aero Generic drug: Budeson-Glycopyrrol-Formoterol Take 2 puffs first thing in am and then another 2 puffs about 12 hours later.   Breztri Aerosphere 160-9-4.8 MCG/ACT Aero Generic drug: Budeson-Glycopyrrol-Formoterol Inhale 2 puffs into the lungs in the morning and at bedtime.   fluticasone 50 MCG/ACT nasal spray Commonly known as: FLONASE Place 2 sprays into both nostrils daily.   metFORMIN 500 MG tablet Commonly known as: GLUCOPHAGE Take 1 tablet (500 mg total) by mouth 2 (two) times daily with a meal.   pravastatin 40 MG tablet Commonly known as: PRAVACHOL TAKE 1 TABLET AT BEDTIME   predniSONE 5 MG tablet Commonly known as: DELTASONE Take 1 tablet (5 mg total) by mouth daily with breakfast.   triamterene-hydrochlorothiazide 37.5-25 MG tablet Commonly known as: MAXZIDE-25 TAKE 1 AND 1/2 TABLETS EVERY DAY        Allergies: No Known Allergies  No family history on file.  Social History:  reports that he quit smoking about 5 years ago. His smoking use included cigarettes. He has a 20.00 pack-year smoking  history. He has never used smokeless tobacco. He reports that he does not currently use alcohol after a past usage of about 3.0 standard drinks per week. He reports that he does not use drugs.  ROS: A complete review of systems was performed.  All systems are negative except for pertinent findings as noted.  Physical Exam:  Vital signs in last 24 hours: There were no vitals taken for this visit. Constitutional:  Alert and oriented, No acute distress Cardiovascular: Regular rate  Respiratory: Normal respiratory effort GI: Abdomen  is soft, nontender, nondistended, no abdominal masses. No CVAT.  Genitourinary: Normal uncircumcised male phallus, testes are mildly atrophic and descended bilaterally and non-tender and without masses, scrotum is normal in appearance without lesions or masses, perineum is normal on inspection.  Prostate 40 g, slightly firm bilaterally. Lymphatic: No lymphadenopathy Neurologic: Grossly intact, no focal deficits Psychiatric: Normal mood and affect  I have reviewed prior pt notes  I have reviewed notes from referring/previous physicians  I have reviewed urinalysis results  I have reviewed prior PSA results    Impression/Assessment:  Elevated PSA with possible history of prior ultrasound and biopsy.  He does have mild abnormality of his DRE  Plan:  I have recommended ultrasound and biopsy of the prostate as well as the outpatient nature and risks and complications involved.  He understands these and desires to proceed.  He will need to be off of his aspirin for a few days before this.

## 2020-09-05 NOTE — Progress Notes (Signed)

## 2020-09-05 NOTE — Patient Instructions (Addendum)
   Appointment Time: Arrive 8:00 am Appointment Date: 09/19/2020  Location: [name]   Prostate Biopsy Instructions  Stop all aspirin or blood thinners (aspirin, plavix, coumadin, warfarin, motrin, ibuprofen, advil, aleve, naproxen, naprosyn) for 7 days prior to the procedure.  If you have any questions about stopping these medications, please contact your primary care physician or cardiologist.  Having a light meal prior to the procedure is recommended.  If you are diabetic or have low blood sugar please bring a small snack or glucose tablet.  A Fleets enema is needed to be purchased over the counter at a local pharmacy and used 2 hours before you scheduled appointment.  This can be purchased over the counter at any pharmacy.  Antibiotics will be administered in the clinic at the time of the procedure and 1 tablet has been sent to your pharmacy. Please take the antibiotic as prescribed.    Please bring someone with you to the procedure to drive you home if you are given a valium to take prior to your procedure.   If you have any questions or concerns, please feel free to call the office at [phone] or send a Mychart message.    Thank you, [facility]

## 2020-09-07 ENCOUNTER — Ambulatory Visit (INDEPENDENT_AMBULATORY_CARE_PROVIDER_SITE_OTHER): Payer: Medicare HMO | Admitting: Family Medicine

## 2020-09-07 ENCOUNTER — Other Ambulatory Visit: Payer: Self-pay

## 2020-09-07 ENCOUNTER — Telehealth: Payer: Self-pay | Admitting: Pulmonary Disease

## 2020-09-07 ENCOUNTER — Encounter: Payer: Self-pay | Admitting: Family Medicine

## 2020-09-07 VITALS — BP 130/75 | HR 69 | Resp 16 | Ht 67.0 in | Wt 120.0 lb

## 2020-09-07 DIAGNOSIS — J209 Acute bronchitis, unspecified: Secondary | ICD-10-CM

## 2020-09-07 DIAGNOSIS — J440 Chronic obstructive pulmonary disease with acute lower respiratory infection: Secondary | ICD-10-CM | POA: Diagnosis not present

## 2020-09-07 DIAGNOSIS — J449 Chronic obstructive pulmonary disease, unspecified: Secondary | ICD-10-CM

## 2020-09-07 DIAGNOSIS — Z1211 Encounter for screening for malignant neoplasm of colon: Secondary | ICD-10-CM

## 2020-09-07 DIAGNOSIS — J310 Chronic rhinitis: Secondary | ICD-10-CM | POA: Diagnosis not present

## 2020-09-07 DIAGNOSIS — E785 Hyperlipidemia, unspecified: Secondary | ICD-10-CM

## 2020-09-07 DIAGNOSIS — E099 Drug or chemical induced diabetes mellitus without complications: Secondary | ICD-10-CM

## 2020-09-07 DIAGNOSIS — I10 Essential (primary) hypertension: Secondary | ICD-10-CM | POA: Diagnosis not present

## 2020-09-07 DIAGNOSIS — R0600 Dyspnea, unspecified: Secondary | ICD-10-CM | POA: Diagnosis not present

## 2020-09-07 DIAGNOSIS — R0609 Other forms of dyspnea: Secondary | ICD-10-CM

## 2020-09-07 DIAGNOSIS — Z23 Encounter for immunization: Secondary | ICD-10-CM | POA: Diagnosis not present

## 2020-09-07 MED ORDER — MONTELUKAST SODIUM 10 MG PO TABS: 10.0000 mg | ORAL_TABLET | Freq: Every day | ORAL | 3 refills | Status: DC

## 2020-09-07 NOTE — Patient Instructions (Addendum)
Annual physical exam in November with MD, when due, call if you need me sooner  Flu vaccine today  Nurse please ambulate pt to see if he qualifies for supplemental oxygen and document if he does , including effect of supplemental oxygen if he needs it  You are referred  to pulmonary Specialist, you should have ha d a f/u in July per Dr. Juanetta Gosling note in March, please give appointment at checkout  Need your shingles vaccines at your pharmacy  STOP flonase as it does not work, new is montelukast tablet one daily for allergies and will help with breathing  Nurse please arrange cologuard for colon screen  All the best with prostate biopsy  Thanks for choosing [facility], we consider it a privelige to serve you.

## 2020-09-07 NOTE — Telephone Encounter (Signed)
Called pt to discuss and he did not answer- most likely needs ov. LMTCB.

## 2020-09-08 NOTE — Telephone Encounter (Signed)
Called pt and there was no answer- LMTCB- Try once more today since having symptoms

## 2020-09-11 ENCOUNTER — Encounter: Payer: Self-pay | Admitting: Family Medicine

## 2020-09-11 NOTE — Assessment & Plan Note (Signed)
Hyperlipidemia:Low fat diet discussed and encouraged.   Lipid Panel  Lab Results  Component Value Date   CHOL 176 08/14/2020   HDL 82 08/14/2020   LDLCALC 83 08/14/2020   TRIG 56 08/14/2020   CHOLHDL 2.1 08/14/2020     Controlled, no change in medication

## 2020-09-11 NOTE — Assessment & Plan Note (Signed)
Reports worsening on current medication regime, Pulmonary to re eval

## 2020-09-11 NOTE — Assessment & Plan Note (Signed)
Controlled, no change in medication DASH diet and commitment to daily physical activity for a minimum of 30 minutes discussed and encouraged, as a part of hypertension management. The importance of attaining a healthy weight is also discussed.  BP/Weight 09/07/2020 09/05/2020 07/05/2020 04/06/2020 04/05/2020 02/07/2020 [id number]  Systolic BP AB-[id number] Q000111Q 123456 0000000 [id number]  Diastolic BP 75 73 80 90 93 88 77  Wt. (Lbs) 120 - 124 129.2 130.12 131.2 133.4  BMI 18.79 - 19.42 20.24 20.38 20.55 20.89

## 2020-09-11 NOTE — Assessment & Plan Note (Signed)
Controlled, no change in medication [name] is reminded of the importance of commitment to daily physical activity for 30 minutes or more, as able and the need to limit carbohydrate intake to 30 to 60 grams per meal to help with blood sugar control.   The need to take medication as prescribed, test blood sugar as directed, and to call between visits if there is a concern that blood sugar is uncontrolled is also discussed.   [name] is reminded of the importance of daily foot exam, annual eye examination, and good blood sugar, blood pressure and cholesterol control.  Diabetic Labs Latest Ref Rng & Units 08/14/2020 04/05/2020 11/25/2019 07/29/2019 07/09/2019  HbA1c 4.8 - 5.6 % 6.3(H) 7.9(A) - - 6.0  Chol 100 - 199 mg/dL 176 - - - -  HDL >39 mg/dL 82 - - - -  Calc LDL 0 - 99 mg/dL 83 - - - -  Triglycerides 0 - 149 mg/dL 56 - - - -  Creatinine 0.76 - 1.27 mg/dL 0.85 - 0.80 0.76 -   BP/Weight 09/07/2020 09/05/2020 07/05/2020 04/06/2020 04/05/2020 02/07/2020 [id number]  Systolic BP AB-123456789 Q000111Q 123456 0000000 [id number]  Diastolic BP 75 73 80 90 93 88 77  Wt. (Lbs) 120 - 124 129.2 130.12 131.2 133.4  BMI 18.79 - 19.42 20.24 20.38 20.55 20.89   Foot/eye exam completion dates Latest Ref Rng & Units 06/06/2020 06/06/2020  Eye Exam No Retinopathy No Retinopathy No Retinopathy  Foot Form Completion - - -

## 2020-09-11 NOTE — Assessment & Plan Note (Addendum)
Severe will f/u with Pulmonary,on ambulation oxygen level remained at 92 % on room air,does not requie portable oxygen

## 2020-09-11 NOTE — Assessment & Plan Note (Deleted)
Patient educated about the importance of limiting  Carbohydrate intake , the need to commit to daily physical activity for a minimum of 30 minutes , and to commit weight loss. The fact that changes in all these areas will reduce or eliminate all together the development of diabetes is stressed.   Diabetic Labs Latest Ref Rng & Units 08/14/2020 04/05/2020 11/25/2019 07/29/2019 07/09/2019  HbA1c 4.8 - 5.6 % 6.3(H) 7.9(A) - - 6.0  Chol 100 - 199 mg/dL 176 - - - -  HDL >39 mg/dL 82 - - - -  Calc LDL 0 - 99 mg/dL 83 - - - -  Triglycerides 0 - 149 mg/dL 56 - - - -  Creatinine 0.76 - 1.27 mg/dL 0.85 - 0.80 0.76 -   BP/Weight 09/07/2020 09/05/2020 07/05/2020 04/06/2020 04/05/2020 02/07/2020 [id number]  Systolic BP [id number]-123456789 Q000111Q 123456 0000000 [id number]  Diastolic BP 75 73 80 90 93 88 77  Wt. (Lbs) 120 - 124 129.2 130.12 131.2 133.4  BMI 18.79 - 19.42 20.24 20.38 20.55 20.89   Foot/eye exam completion dates Latest Ref Rng & Units 06/06/2020 06/06/2020  Eye Exam No Retinopathy No Retinopathy No Retinopathy  Foot Form Completion - - -

## 2020-09-11 NOTE — Assessment & Plan Note (Signed)
States flonase ineffective , d/c same , new is singulair daily

## 2020-09-11 NOTE — Progress Notes (Signed)
ONDRE SOHM     [name]: [id number]      DOB: [birth year]   HPI Mr. Eckart is here for follow up and re-evaluation of chronic medical conditions, medication management and review of any available recent lab and radiology data.  Preventive health is updated, specifically  Cancer screening and Immunization.   Questions or concerns regarding consultations or procedures which the PT has had in the interim are  addressed. c/o uncontrolled and increased wheezing  and shortness of breath on new med by Pulmonary, f/u past due and will be scheduled  C/o nasal congestion states flonase is ineffective ROS Denies recent fever or chills. Denies sinus pressure, nasal congestion, ear pain or sore throat. . Denies chest pains, palpitations and leg swelling Denies abdominal pain, nausea, vomiting,diarrhea or constipation.   Denies dysuria, frequency, hesitancy or incontinence. Denies joint pain, swelling and limitation in mobility. Denies headaches, seizures, numbness, or tingling. Denies depression, anxiety or insomnia. Denies skin break down or rash.   PE  BP 130/75   Pulse 69   Resp 16   Ht '5\' 7"'$  (1.702 m)   Wt 120 lb (54.4 kg)   SpO2 93%   BMI 18.79 kg/m   Patient alert and oriented and in no cardiopulmonary distress.  HEENT: No facial asymmetry, EOMI,     Neck supple .  Chest: decreased air entry scattered wheezes , no cracklesy.  CVS: S1, S2 no murmurs, no S3.Regular rate.  ABD: Soft non tender.   Ext: No edema  MS: Adequate ROM spine, shoulders, hips and knees.  Skin: Intact, no ulcerations or rash noted.  Psych: Good eye contact, normal affect. Memory intact not anxious or depressed appearing.  CNS: CN 2-12 intact, power,  normal throughout.no focal deficits noted.   Assessment & Plan  Essential hypertension Controlled, no change in medication DASH diet and commitment to daily physical activity for a minimum of 30 minutes discussed and encouraged, as a part of  hypertension management. The importance of attaining a healthy weight is also discussed.  BP/Weight 09/07/2020 09/05/2020 07/05/2020 04/06/2020 04/05/2020 02/07/2020 [id number]  Systolic BP [id number] Q000111Q 123456 0000000 [id number] [id number]  Diastolic BP 75 73 80 90 93 88 77  Wt. (Lbs) 120 - 124 129.2 130.12 131.2 133.4  BMI 18.79 - 19.42 20.24 20.38 20.55 20.89       DOE (dyspnea on exertion) Reports worsening on current medication regime, Pulmonary to re eval  Hyperlipidemia Hyperlipidemia:Low fat diet discussed and encouraged.   Lipid Panel  Lab Results  Component Value Date   CHOL 176 08/14/2020   HDL 82 08/14/2020   LDLCALC 83 08/14/2020   TRIG 56 08/14/2020   CHOLHDL 2.1 08/14/2020     Controlled, no change in medication   DM (diabetes mellitus) (HCC) Controlled, no change in medication Mr. Settlemire is reminded of the importance of commitment to daily physical activity for 30 minutes or more, as able and the need to limit carbohydrate intake to 30 to 60 grams per meal to help with blood sugar control.   The need to take medication as prescribed, test blood sugar as directed, and to call between visits if there is a concern that blood sugar is uncontrolled is also discussed.   Mr. Shor is reminded of the importance of daily foot exam, annual eye examination, and good blood sugar, blood pressure and cholesterol control.  Diabetic Labs Latest Ref Rng & Units 08/14/2020 04/05/2020 11/25/2019 07/29/2019 07/09/2019  HbA1c 4.8 - 5.6 % 6.3(H) 7.9(A) - -  6.0  Chol 100 - 199 mg/dL 176 - - - -  HDL >39 mg/dL 82 - - - -  Calc LDL 0 - 99 mg/dL 83 - - - -  Triglycerides 0 - 149 mg/dL 56 - - - -  Creatinine 0.76 - 1.27 mg/dL 0.85 - 0.80 0.76 -   BP/Weight 09/07/2020 09/05/2020 07/05/2020 04/06/2020 04/05/2020 02/07/2020 [id number]  Systolic BP AB-123456789 Q000111Q 123456 0000000 [id number]  Diastolic BP 75 73 80 90 93 88 77  Wt. (Lbs) 120 - 124 129.2 130.12 131.2 133.4  BMI 18.79 - 19.42 20.24 20.38 20.55 20.89   Foot/eye exam  completion dates Latest Ref Rng & Units 06/06/2020 06/06/2020  Eye Exam No Retinopathy No Retinopathy No Retinopathy  Foot Form Completion - - -        COPD GOLD IV/ group D  Severe will f/u with Pulmonary,on ambulation oxygen level remained at 92 % on room air,does not requie portable oxygen  Rhinitis, chronic States flonase ineffective , d/c same , new is singulair daily

## 2020-09-12 ENCOUNTER — Ambulatory Visit (INDEPENDENT_AMBULATORY_CARE_PROVIDER_SITE_OTHER): Payer: Medicare HMO | Admitting: *Deleted

## 2020-09-12 DIAGNOSIS — E119 Type 2 diabetes mellitus without complications: Secondary | ICD-10-CM

## 2020-09-12 DIAGNOSIS — Z23 Encounter for immunization: Secondary | ICD-10-CM | POA: Diagnosis not present

## 2020-09-12 DIAGNOSIS — J449 Chronic obstructive pulmonary disease, unspecified: Secondary | ICD-10-CM

## 2020-09-12 NOTE — Chronic Care Management (AMB) (Signed)
Chronic Care Management   CCM RN Visit Note  09/12/2020 Name: [name] [name]: [id number] DOB: 11-14-1950  Subjective: [name] is a 70 y.o. year old male who is a primary care patient of [name], MD. The care management team was consulted for assistance with disease management and care coordination needs.    Engaged with patient by telephone for follow up visit in response to provider referral for case management and/or care coordination services.   Consent to Services:  The patient was given information about Chronic Care Management services, agreed to services, and gave verbal consent prior to initiation of services.  Please see initial visit note for detailed documentation.   Patient agreed to services and verbal consent obtained.   Assessment: Review of patient past medical history, allergies, medications, health status, including review of consultants reports, laboratory and other test data, was performed as part of comprehensive evaluation and provision of chronic care management services.   SDOH (Social Determinants of Health) assessments and interventions performed:    CCM Care Plan  No Known Allergies  Outpatient Encounter Medications as of 09/12/2020  Medication Sig   acetaminophen (TYLENOL) 500 MG tablet Take 500 mg by mouth every 6 (six) hours as needed. Takes 1 tablet "almost" every day   albuterol (PROVENTIL) (2.5 MG/3ML) 0.083% nebulizer solution INHALE THE CONTENTS OF 1 VIAL VIA NEBULIZER EVERY 6 (SIX) HOURS AS NEEDED FOR WHEEZING OR SHORTNESS OF BREATH.   albuterol (VENTOLIN HFA) 108 (90 Base) MCG/ACT inhaler INHALE 2 PUFFS INTO THE LUNGS EVERY FOUR HOURS AS NEEDED. ONLY IF YOU CAN'T CATCH YOUR BREATH.   amLODipine (NORVASC) 5 MG tablet Take 1 tablet (5 mg total) by mouth daily.   aspirin EC 81 MG tablet Take 81 mg by mouth daily. Swallow whole.   Budeson-Glycopyrrol-Formoterol (BREZTRI AEROSPHERE) 160-9-4.8 MCG/ACT AERO Inhale 2 puffs into the lungs  in the morning and at bedtime.   metFORMIN (GLUCOPHAGE) 500 MG tablet Take 1 tablet (500 mg total) by mouth 2 (two) times daily with a meal.   montelukast (SINGULAIR) 10 MG tablet Take 1 tablet (10 mg total) by mouth at bedtime.   pravastatin (PRAVACHOL) 40 MG tablet TAKE 1 TABLET AT BEDTIME   predniSONE (DELTASONE) 5 MG tablet Take 1 tablet (5 mg total) by mouth daily with breakfast.   triamterene-hydrochlorothiazide (MAXZIDE-25) 37.5-25 MG tablet TAKE 1 AND 1/2 TABLETS EVERY DAY   [DISCONTINUED] simvastatin (ZOCOR) 40 MG tablet Take 40 mg by mouth daily.   No facility-administered encounter medications on file as of 09/12/2020.    Patient Active Problem List   Diagnosis Date Noted   Rhinitis, chronic 06/10/2019   Pulmonary infiltrates on CXR 05/29/2019   COPD GOLD IV/ group D  05/11/2019   Low oxygen saturation 03/23/2019   Food insecurity 11/22/2018   DOE (dyspnea on exertion) 04/18/2018   Social isolation 03/17/2013   Asthma with COPD (chronic obstructive pulmonary disease) ([place]) 11/12/2012   Tachycardia 11/05/2010   DM (diabetes mellitus) ([place]) 10/26/2009   Vitamin D deficiency 06/28/2009   PSA, INCREASED 01/25/2009   Hyperlipidemia 07/30/2007   Essential hypertension 07/30/2007    Conditions to be addressed/monitored:COPD and DMII  Care Plan : COPD (Adult)  Updates made by Kassie Mends, RN since 09/12/2020 12:00 AM     Problem: Disease Progression (COPD)   Priority: High     Long-Range Goal: Disease Progression Minimized or Managed   Start Date: 02/24/2020  Expected End Date: 08/22/2020  This Visit's Progress: On  track  Recent Progress: On track  Priority: High  Note:   Timeframe:  Long-Range Goal Priority:  High Start Date:      02/24/2020                       Expected End Date:       03/23/2021  Current Barriers:  Knowledge deficits related to basic understanding of COPD disease process and importance of oxygen management and safety Knowledge deficits related  to basic COPD self care/management- continues to need education/ reinforcement for COPD action plan and importance of calling doctor for symptom management Literacy barriers- patient has friend that reads his mail etc for him Transportation barriers- patient rides moped to appointments, shopping, etc.- has friend who provides transportation at times Limited Social Support- adult children live out of state, patient is widowed, has friend Audiological scientist) that helps him Microbiologist - patient does not go to church or other groups (due respiratory issues) Case Manager Clinical Goal(s): Patient will verbalize understanding of COPD action plan and when to seek appropriate levels of medical care Patient will verbalize basic understanding of COPD disease process and self care activities  Interventions:  Collaboration with Fayrene Helper, MD regarding development and update of comprehensive plan of care as evidenced by provider attestation and co-signature Inter-disciplinary care team collaboration (see longitudinal plan of care) Reviewed with patient COPD education/ reinforcement on self care/management/and exacerbation prevention Reviewed COPD action plan and importance of daily self assessment and calling doctor promptly as needed Reinforced instruction about proper use of medications used for management of COPD including inhalers Reinforced importance of the role of exercise in the management of COPD and encouraged pt to continue being active, walking as tolerated indoors at stores as he has been doing Reinforced triggers for COPD exacerbation such as environmental stimulants (pollen, smoke, etc) and to avoid, reinforced wearing mask as needed Reviewed oxygen safety tips (no one is to smoke around oxygen, keep flow rate as ordered by doctor, be mindful of long tubing). Reviewed upcoming appointment with Dr. Halford Chessman 10/28 Patient Goals/ Self Care Activities: Continue to ask friends for help as  needed Alternate activity with rest as this keeps you from getting too tired Continue walking inside stores follow rescue, be mindful of how you are feeling each day call your doctor for any change in symptoms such as coughing more than usual, inhalers are not helping or overall not feeling well Follow up with Dr. Halford Chessman (lung doctor) 10/28 and Dr. Moshe Cipro on 11/10 Continue deep breathing exercises daily Wear a mask as needed and wash your hands Use oxygen as needed and only as prescribed- do not increase 02 rate unless you discuss with your doctor first Be careful of oxygen tubing so that you do not trip  Follow up plan: by telephone outreach-   10/31/2020    Care Plan : Diabetes Type 2 (Adult)  Updates made by Kassie Mends, RN since 09/12/2020 12:00 AM     Problem: Glycemic Management (Diabetes, Type 2)   Priority: High     Long-Range Goal: Glycemic Management Optimized   Start Date: 06/15/2020  Expected End Date: 12/15/2020  This Visit's Progress: On track  Recent Progress: Not on track  Priority: High  Note:   Objective:  Lab Results  Component Value Date   HGBA1C 7.9 (A) 04/05/2020   Lab Results  Component Value Date   CREATININE 0.80 11/25/2019   CREATININE 0.76 07/29/2019   CREATININE 0.72  07/01/2019  Most recent AIC is 6.3  08/14/2020 No results found for: EGFR Current Barriers:  Knowledge Deficits related to basic Diabetes pathophysiology and self care/management- pt recently placed on metformin BID with elevated CBG most likely form prednisone usage (per notes) Does not use cbg meter - patient reports he is currently not checking CBG and not instructed to do so, pt followed up with primary care provider 11/16/20 Does not adhere to provider recommendations re: ADA diet, pt eats what he wants and continues to drink sugary soft drinks, eats out alot Does not contact provider office for questions/concerns- patient does not always stay in touch with doctor for problems/  issues that may arise Case Manager Clinical Goal(s):  patient will demonstrate improved adherence to prescribed treatment plan for diabetes self care/management as evidenced by: adherence to ADA/ carb modified diet, call provider with any new/ worsening symptoms Interventions:  Collaboration with Fayrene Helper, MD regarding development and update of comprehensive plan of care as evidenced by provider attestation and co-signature- in basket sent to PCP to inquire is patient supposed to be checking CBG and order for glucometer to be [facility] to [facility]. Inter-disciplinary care team collaboration (see longitudinal plan of care) Reinforced education to patient about basic DM disease process Reviewed importance of taking medications as prescribed Reinforced plans with patient for ongoing care management follow up and provided patient with direct contact information for care management tea Reviewed scheduled/upcoming provider appointments including: 11/10 with primary care provider, 11/29 [facility], [place] Dr. Diona Fanti,  10/28  Dr. Halford Chessman Review of patient status, including review of consultants reports, relevant laboratory and other test results, and medications completed-  recent AIC 6.3 Reviewed importance of being mindful of carbohydrate intake and avoiding/ limiting concentrated sweets and sugary soft drinks- gave examples of foods high in carbohydrates Reviewed portion control Encouraged pt to continue walking and getting outside everyday Self-Care Activities Unable to independently drive, rides Moped and has friend that transports pt at times Attends all scheduled provider appointments Adheres to prescribed ADA/carb modified Patient Goals: - you are currently not checking blood sugar- please be mindful of how you feel each day - low blood sugar symptoms- shaky, sweaty, nervous, dizzy - Try to limit or stop drinking sugary drinks - Watch carbohydrate intake at each meal- too much rice, pasta, bread,  starchy food will increase your blood sugar - Limit fast food - avoid/ limit concentrated sweets such as cookies, pie, cake, etc. - continue to take metformin as prescribed - try to exercise/ walk some everyday - follow up with Dr. Moshe Cipro on 11/10 Follow Up Plan: Telephone follow up appointment with care management team member scheduled for:   10/31/2020     Plan:Telephone follow up appointment with care management team member scheduled for:  10/31/2020  Jacqlyn Larsen [facility], BSN RN Case Manager [place] [facility] [phone]

## 2020-09-12 NOTE — Patient Instructions (Signed)
Visit Information  PATIENT GOALS:  Goals Addressed             This Visit's Progress    Monitor and Manage My Blood Sugar-Diabetes Type 2       Timeframe:  Long-Range Goal Priority:  High Start Date:           06/15/2020                  Expected End Date:     12/15/2020                  Follow Up Date- 10/31/2020   - you are currently not checking blood sugar- please be mindful of how you feel each day - low blood sugar symptoms- shaky, sweaty, nervous, dizzy - Try to limit or stop drinking sugary drinks - Watch carbohydrate intake at each meal- too much rice, pasta, bread, starchy food will increase your blood sugar - Limit fast food - avoid/ limit concentrated sweets such as cookies, pie, cake, etc. - continue to take metformin as prescribed - try to exercise/ walk some everyday - follow up with Dr. Moshe Cipro on 11/10   Why is this important?   Checking your blood sugar at home helps to keep it from getting very high or very low.  Writing the results in a diary or log helps the doctor know how to care for you.  Your blood sugar log should have the time, date and the results.  Also, write down the amount of insulin or other medicine that you take.  Other information, like what you ate, exercise done and how you were feeling, will also be helpful.     Notes:      Track and Manage My Symptoms-COPD       Timeframe:  Long-Range Goal Priority:  High Start Date:      02/24/2020                       Expected End Date:   03/23/2021                 Follow Up Date 10/31/2020 Continue to ask friends for help as needed Alternate activity with rest as this keeps you from getting too tired Continue walking inside stores follow rescue, be mindful of how you are feeling each day call your doctor for any change in symptoms such as coughing more than usual, inhalers are not helping or overall not feeling well Follow up with Dr. Halford Chessman (lung doctor) 10/28 and Dr. Moshe Cipro on 11/10 Continue  deep breathing exercises daily Wear a mask as needed and wash your hands Use oxygen as needed and only as prescribed- do not increase 02 rate unless you discuss with your doctor first Be careful of oxygen tubing so that you do not trip      Why is this important?   Tracking your symptoms and other information about your health helps your doctor plan your care.  Write down the symptoms, the time of day, what you were doing and what medicine you are taking.  You will soon learn how to manage your symptoms.     Notes:         The patient verbalized understanding of instructions, educational materials, and care plan provided today and declined offer to receive copy of patient instructions, educational materials, and care plan.   Telephone follow up appointment with care management team member scheduled for:  10/31/2020  Ian Free  Lisette [name], BSN RN Case Advertising copywriter Primary Care [phone]

## 2020-09-13 NOTE — Telephone Encounter (Signed)
Will close this encounter per protocol and several unsuccessful attempts to get a hold of patient.

## 2020-09-15 ENCOUNTER — Telehealth: Payer: Self-pay

## 2020-09-15 NOTE — Telephone Encounter (Signed)
Patient called to review biopsy instructions for Tuesday. No answer. Left message

## 2020-09-18 ENCOUNTER — Other Ambulatory Visit: Payer: Self-pay | Admitting: Internal Medicine

## 2020-09-18 ENCOUNTER — Telehealth: Payer: Self-pay | Admitting: Pulmonary Disease

## 2020-09-18 MED ORDER — BREZTRI AEROSPHERE 160-9-4.8 MCG/ACT IN AERO: 2.0000 | INHALATION_SPRAY | Freq: Two times a day (BID) | RESPIRATORY_TRACT | 0 refills | Status: DC

## 2020-09-18 NOTE — Telephone Encounter (Signed)
Called and spoke with [facility] to see if they received the RX that was sent this morning. I was told that they did receive it but stated it looked like it was filled somewhere else so insurance wasn't covering it. It was ordered from mail order. Called and spoke with patient to let him know that pharmacy said and that I was going to leave samples upfront for him to pick up. Patient expressed understanding. Nothing further needed at this time.

## 2020-09-18 NOTE — Telephone Encounter (Signed)
Called and spoke with patient who is calling to get RX sent to [place] because he is waiting to get his normal RX in the mail. RX has been sent. Nothing further needed at this time.

## 2020-09-18 NOTE — Progress Notes (Signed)
Patient presents for ultrasound and biopsy of the prostate for elevated PSA.  Risks, benefits, and some of the potential complications of a transrectal ultrasounds of the prostate (TRUSP) with biopsies were discussed at length with the patient including gross hematuria, blood in the bowel movements, hematospermia, bacteremia, infection, voiding discomfort, urinary retention, fever, chills, sepsis, blood transfusion, death, and others. All questions were answered. Informed consent was obtained. The patient confirmed that he had taken his pre-procedure antibiotic. All anticoagulants were discontinued prior to the procedure. The patient emptied his bladder. He was positioned in a comfortable left lateral decubitus position with hips and knees acutely flexed.  The rectal probe was inserted into the rectum without difficulty. 10cc of 2% Lidocaine without epinephrine was instilled with a spinal needle using ultrasound guidance near the junction of each seminal vesicle and the prostate.  Sequential transverse (axial) scans were made in small increments beginning at the seminal vesicles and ending at the prostatic apex. Sequential longitudinal (saggital) scans were made in small increments beginning at the right lateral prostate and ending at the left lateral prostate. Excellent anatomical imaging was obtained. The peripheral, transitional, and central zones were well-defined. The seminal vesicles were normal.  Prostate volume 33 ml.  There were no hypoechoic areas. 12 needle core biopsies were performed. 1 biopsy each was taken from the following areas:  Right lateral base, right medial base, right lateral mid prostate, right medial mid prostate, right lateral apical prostate, right medial apical prostate, left lateral base, left medial base, left lateral mid prostate, left medial mid prostate, left lateral apical prostate, left medial apical prostate.. Minimal prostatic calcifications were noted. Excellent  biopsy specimens were obtained.  Follow-up rectal examination was unremarkable. The procedure was well-tolerated and without complications. Antibiotic instructions were given. The patient was told that:  For several days:  he should increase his fluid intake and limit strenuous activity  he might have mild discomfort at the base of his penis or in his rectum  he might have blood in his urine or blood in his bowel movements  For 2-3 months:  he might have blood in his ejaculate (semen)  Instructions were given to call the office immedicately for blood clots in the urine or bowel movements, difficulty urinating, inability to urinate, urinary retention, painful or frequent urination, fever, chills, nausea, vomiting, or other illness. The patient stated that he understood these instructions and would comply with them. We told the patient that prostate biopsy pathology reports are usually available within 3-5 working days, unless a pathologic second opinion is required, which may take 7-14 days. We told him to contact us to check on the status of his biopsy if he has not heard from [place] within 7 days. The patient left the ultrasound examination room in stable condition.

## 2020-09-18 NOTE — Telephone Encounter (Signed)
States it usually comes by mail but will be 9-10 days before coming in and wanted some sent to [facility].

## 2020-09-19 ENCOUNTER — Encounter: Payer: Self-pay | Admitting: Urology

## 2020-09-19 ENCOUNTER — Other Ambulatory Visit: Payer: Self-pay | Admitting: Urology

## 2020-09-19 ENCOUNTER — Ambulatory Visit (INDEPENDENT_AMBULATORY_CARE_PROVIDER_SITE_OTHER): Payer: Medicare HMO | Admitting: Urology

## 2020-09-19 ENCOUNTER — Ambulatory Visit (HOSPITAL_COMMUNITY)
Admission: RE | Admit: 2020-09-19 | Discharge: 2020-09-19 | Disposition: A | Discharge status: Home / Self Care | Payer: Medicare HMO | Source: Ambulatory Visit | Attending: Urology | Admitting: Urology

## 2020-09-19 ENCOUNTER — Other Ambulatory Visit: Payer: Self-pay

## 2020-09-19 ENCOUNTER — Encounter (HOSPITAL_COMMUNITY): Payer: Self-pay

## 2020-09-19 DIAGNOSIS — D075 Carcinoma in situ of prostate: Secondary | ICD-10-CM | POA: Diagnosis not present

## 2020-09-19 DIAGNOSIS — C61 Malignant neoplasm of prostate: Secondary | ICD-10-CM | POA: Insufficient documentation

## 2020-09-19 DIAGNOSIS — R972 Elevated prostate specific antigen [PSA]: Secondary | ICD-10-CM | POA: Diagnosis not present

## 2020-09-19 MED ORDER — GENTAMICIN SULFATE 40 MG/ML IJ SOLN: 160.0000 mg | Freq: Once | INTRAMUSCULAR | Status: AC

## 2020-09-19 MED ORDER — LIDOCAINE HCL (PF) 2 % IJ SOLN: 10.0000 mL | Freq: Once | INTRAMUSCULAR | Status: AC

## 2020-09-19 MED ORDER — LIDOCAINE HCL (PF) 2 % IJ SOLN
INTRAMUSCULAR | Status: AC
  Administered 2020-09-19: 10 mL
  Filled 2020-09-19: qty 30

## 2020-09-19 MED ORDER — GENTAMICIN SULFATE 40 MG/ML IJ SOLN
INTRAMUSCULAR | Status: AC
  Administered 2020-09-19: 160 mg via INTRAMUSCULAR
  Filled 2020-09-19: qty 4

## 2020-09-19 NOTE — Progress Notes (Signed)
PT tolerated prostate biopsy procedure and antibiotic injections well today. Labs obtained and sent for pathology. PT ambulatory at discharge with no acute distress noted and verbalized understanding of discharge instructions.  

## 2020-09-25 DIAGNOSIS — J449 Chronic obstructive pulmonary disease, unspecified: Secondary | ICD-10-CM | POA: Diagnosis not present

## 2020-10-06 DIAGNOSIS — J449 Chronic obstructive pulmonary disease, unspecified: Secondary | ICD-10-CM

## 2020-10-06 DIAGNOSIS — E119 Type 2 diabetes mellitus without complications: Secondary | ICD-10-CM

## 2020-10-09 ENCOUNTER — Telehealth: Payer: Self-pay

## 2020-10-09 ENCOUNTER — Telehealth: Payer: Self-pay | Admitting: Family Medicine

## 2020-10-09 ENCOUNTER — Other Ambulatory Visit: Payer: Self-pay

## 2020-10-09 DIAGNOSIS — E119 Type 2 diabetes mellitus without complications: Secondary | ICD-10-CM

## 2020-10-09 MED ORDER — MONTELUKAST SODIUM 10 MG PO TABS: 10.0000 mg | ORAL_TABLET | Freq: Every day | ORAL | 3 refills | Status: DC

## 2020-10-09 MED ORDER — METFORMIN HCL 500 MG PO TABS: 500.0000 mg | ORAL_TABLET | Freq: Two times a day (BID) | ORAL | 1 refills | Status: DC

## 2020-10-09 NOTE — Telephone Encounter (Signed)
-----   Message from [name], MD sent at 10/06/2020 12:15 PM EDT -----  I was finally able to reach this man after several phone calls.  He does not need any evaluation right now but I would like him to come in to speak with me about follow-up.  Please schedule let appointment and see if he can bring his friend, Inez Catalina in with him. ----- Message ----- From: Iris Pert, LPN Sent: [id number]/[id number]   8:02 AM EDT To: [name], MD  Please review

## 2020-10-09 NOTE — Telephone Encounter (Signed)
Patient called with no answer. Detailed message left. Patient informed to call office if he is unable to keep scheduled appointment or time.

## 2020-10-09 NOTE — Telephone Encounter (Signed)
refilled 

## 2020-10-09 NOTE — Telephone Encounter (Signed)
Pt needs metformin & montelukast called in to [facility]

## 2020-10-24 ENCOUNTER — Telehealth: Payer: Self-pay | Admitting: Family Medicine

## 2020-10-24 ENCOUNTER — Other Ambulatory Visit: Payer: Self-pay

## 2020-10-24 DIAGNOSIS — E119 Type 2 diabetes mellitus without complications: Secondary | ICD-10-CM

## 2020-10-24 MED ORDER — METFORMIN HCL 500 MG PO TABS: 500.0000 mg | ORAL_TABLET | Freq: Two times a day (BID) | ORAL | 1 refills | Status: DC

## 2020-10-24 NOTE — Telephone Encounter (Signed)
Med refilled.

## 2020-10-24 NOTE — Telephone Encounter (Signed)
Pt stopped by to get Metformin called in

## 2020-10-25 DIAGNOSIS — J449 Chronic obstructive pulmonary disease, unspecified: Secondary | ICD-10-CM | POA: Diagnosis not present

## 2020-10-31 ENCOUNTER — Ambulatory Visit (INDEPENDENT_AMBULATORY_CARE_PROVIDER_SITE_OTHER): Payer: Medicare HMO | Admitting: *Deleted

## 2020-10-31 DIAGNOSIS — E119 Type 2 diabetes mellitus without complications: Secondary | ICD-10-CM

## 2020-10-31 DIAGNOSIS — J449 Chronic obstructive pulmonary disease, unspecified: Secondary | ICD-10-CM

## 2020-10-31 NOTE — Chronic Care Management (AMB) (Signed)
Chronic Care Management   CCM RN Visit Note  10/31/2020 Name: [name] [name]: [id number] DOB: 08/17/1950  Subjective: [name] is a 70 y.o. year old male who is a primary care patient of [name], MD. The care management team was consulted for assistance with disease management and care coordination needs.    Engaged with patient by telephone for follow up visit in response to provider referral for case management and/or care coordination services.   Consent to Services:  The patient was given information about Chronic Care Management services, agreed to services, and gave verbal consent prior to initiation of services.  Please see initial visit note for detailed documentation.   Patient agreed to services and verbal consent obtained.   Assessment: Review of patient past medical history, allergies, medications, health status, including review of consultants reports, laboratory and other test data, was performed as part of comprehensive evaluation and provision of chronic care management services.   SDOH (Social Determinants of Health) assessments and interventions performed:    CCM Care Plan  No Known Allergies  Outpatient Encounter Medications as of 10/31/2020  Medication Sig   acetaminophen (TYLENOL) 500 MG tablet Take 500 mg by mouth every 6 (six) hours as needed. Takes 1 tablet "almost" every day   albuterol (PROVENTIL) (2.5 MG/3ML) 0.083% nebulizer solution INHALE THE CONTENTS OF 1 VIAL VIA NEBULIZER EVERY 6 (SIX) HOURS AS NEEDED FOR WHEEZING OR SHORTNESS OF BREATH.   albuterol (VENTOLIN HFA) 108 (90 Base) MCG/ACT inhaler INHALE 2 PUFFS INTO THE LUNGS EVERY FOUR HOURS AS NEEDED. ONLY IF YOU CAN'T CATCH YOUR BREATH.   amLODipine (NORVASC) 5 MG tablet Take 1 tablet (5 mg total) by mouth daily.   aspirin EC 81 MG tablet Take 81 mg by mouth daily. Swallow whole.   Budeson-Glycopyrrol-Formoterol (BREZTRI AEROSPHERE) 160-9-4.8 MCG/ACT AERO Inhale 2 puffs into the  lungs 2 (two) times daily.   metFORMIN (GLUCOPHAGE) 500 MG tablet Take 1 tablet (500 mg total) by mouth 2 (two) times daily with a meal.   montelukast (SINGULAIR) 10 MG tablet Take 1 tablet (10 mg total) by mouth at bedtime.   pravastatin (PRAVACHOL) 40 MG tablet TAKE 1 TABLET AT BEDTIME   predniSONE (DELTASONE) 5 MG tablet Take 1 tablet (5 mg total) by mouth daily with breakfast.   triamterene-hydrochlorothiazide (MAXZIDE-25) 37.5-25 MG tablet TAKE 1 AND 1/2 TABLETS EVERY DAY   [DISCONTINUED] simvastatin (ZOCOR) 40 MG tablet Take 40 mg by mouth daily.   No facility-administered encounter medications on file as of 10/31/2020.    Patient Active Problem List   Diagnosis Date Noted   Rhinitis, chronic 06/10/2019   Pulmonary infiltrates on CXR 05/29/2019   COPD GOLD IV/ group D  05/11/2019   Low oxygen saturation 03/23/2019   Food insecurity 11/22/2018   DOE (dyspnea on exertion) 04/18/2018   Social isolation 03/17/2013   Asthma with COPD (chronic obstructive pulmonary disease) ([place]) 11/12/2012   Tachycardia 11/05/2010   DM (diabetes mellitus) ([place]) 10/26/2009   Vitamin D deficiency 06/28/2009   PSA, INCREASED 01/25/2009   Hyperlipidemia 07/30/2007   Essential hypertension 07/30/2007    Conditions to be addressed/monitored:COPD and DMII  Care Plan : COPD (Adult)  Updates made by Kassie Mends, RN since 10/31/2020 12:00 AM     Problem: Disease Progression (COPD)   Priority: High     Long-Range Goal: Disease Progression Minimized or Managed   Start Date: 02/24/2020  Expected End Date: 08/22/2020  This Visit's Progress: On track  Recent Progress: On track  Priority: High  Note:   Timeframe:  Long-Range Goal Priority:  High Start Date:      02/24/2020                       Expected End Date:       03/23/2021  Current Barriers:  Knowledge deficits related to basic understanding of COPD disease process and importance of oxygen management and safety, pt reports he received flu  vaccine, covid booster and shingles vaccine. Knowledge deficits related to basic COPD self care/management- continues to need education/ reinforcement for COPD action plan and importance of calling doctor for symptom management Literacy barriers- patient has friend that reads his mail etc for him Transportation barriers- patient rides moped to appointments, shopping, etc.- has friend who provides transportation at times Limited Social Support- adult children live out of state, patient is widowed, has friend Audiological scientist) that helps him Microbiologist - patient does not go to church or other groups (due respiratory issues) Case Manager Clinical Goal(s): Patient will verbalize understanding of COPD action plan and when to seek appropriate levels of medical care Patient will verbalize basic understanding of COPD disease process and self care activities  Interventions:  Collaboration with Fayrene Helper, MD regarding development and update of comprehensive plan of care as evidenced by provider attestation and co-signature Inter-disciplinary care team collaboration (see longitudinal plan of care) Reinforced with patient COPD education/ reinforcement on self care/management/and exacerbation prevention Reinforced COPD action plan and importance of daily self assessment and calling doctor promptly as needed- emphasis on yellow zone Reviewed instruction about proper use of medications used for management of COPD including inhalers Reviewed importance of the role of exercise in the management of COPD and encouraged pt to continue being active, walking as tolerated indoors at stores as he has been doing Reviewed triggers for COPD exacerbation such as environmental stimulants (pollen, smoke, etc) and to avoid, reinforced wearing mask as needed Reinforced oxygen safety tips (no one is to smoke around oxygen, keep flow rate as ordered by doctor, be mindful of long tubing). Reviewed upcoming  appointment with Dr. Halford Chessman 10/28 Reviewed importance of good handwashing and wearing a mask as needed Patient Goals/ Self Care Activities: Continue to ask friends for help as needed- practice energy conservation Alternate activity with rest as this keeps you from getting too tired Continue walking inside stores follow COPD rescue/ action plan -be mindful of how you are feeling each day call your doctor for any change in symptoms such as coughing more than usual, inhalers are not helping or overall not feeling well Follow up with Dr. Halford Chessman (lung doctor) 10/28 and Dr. Moshe Cipro on 11/10 Practice deep breathing exercises daily Wear a mask as needed and wash your hands Use oxygen as needed and only as prescribed- do not increase 02 rate unless you discuss with your doctor first Be careful of oxygen tubing so that you do not trip  Follow up plan: by telephone outreach-   10/31/2020    Care Plan : Diabetes Type 2 (Adult)  Updates made by Kassie Mends, RN since 10/31/2020 12:00 AM     Problem: Glycemic Management (Diabetes, Type 2)   Priority: High     Long-Range Goal: Glycemic Management Optimized   Start Date: 06/15/2020  Expected End Date: 12/15/2020  This Visit's Progress: On track  Recent Progress: On track  Priority: High  Note:   Objective:  Lab Results  Component Value  Date   HGBA1C 7.9 (A) 04/05/2020   Lab Results  Component Value Date   CREATININE 0.80 11/25/2019   CREATININE 0.76 07/29/2019   CREATININE 0.72 07/01/2019  Most recent AIC is 6.3  08/14/2020 No results found for: EGFR Current Barriers:  Knowledge Deficits related to basic Diabetes pathophysiology and self care/management- pt recently placed on metformin BID with elevated CBG most likely form prednisone usage (per notes) Does not use cbg meter - patient reports he is currently not checking CBG and not instructed to do so, pt followed up with primary care provider 11/16/20 Does not adhere to provider  recommendations re: ADA diet, pt eats what he wants and continues to drink sugary soft drinks, eats out alot Does not contact provider office for questions/concerns- patient does not always stay in touch with doctor for problems/ issues that may arise Case Manager Clinical Goal(s):  patient will demonstrate improved adherence to prescribed treatment plan for diabetes self care/management as evidenced by: adherence to ADA/ carb modified diet, call provider with any new/ worsening symptoms Interventions:  Collaboration with Fayrene Helper, MD regarding development and update of comprehensive plan of care as evidenced by provider attestation and co-signature- in basket sent to PCP to inquire is patient supposed to be checking CBG and order for glucometer to be sent to [facility]. Inter-disciplinary care team collaboration (see longitudinal plan of care) Reviewed education to patient about basic DM disease process Reviewed importance of taking medications as prescribed Reviewed pans with patient for ongoing care management follow up and provided patient with direct contact information for care management team Reviewed scheduled/upcoming provider appointments including: 11/10 with primary care provider, 11/29 [facility],11/1 Dr. Diona Fanti,  10/28  Dr. Halford Chessman Review of patient status, including review of consultants reports, relevant laboratory and other test results, and medications completed-  recent AIC 6.3 Reinforced importance of being mindful of carbohydrate intake and avoiding/ limiting concentrated sweets and sugary soft drinks- gave examples of foods high in carbohydrates Reinforced portion control food choices Reinforced with pt to continue walking and getting outside everyday Self-Care Activities Unable to independently drive, rides Moped and has friend that transports pt at times Attends all scheduled provider appointments Adheres to prescribed ADA/carb modified Patient Goals:  you are currently  not checking blood sugar- please be mindful of how you feel each day - low blood sugar symptoms- shaky, sweaty, nervous, dizzy- eat or drink if any of these symptoms - Try to limit or stop drinking sugary drinks - Be mindful of carbohydrate intake at each meal- too much rice, pasta, bread, starchy food will increase your blood sugar - Limit fast food and salt - avoid/ limit concentrated sweets such as cookies, pie, cake, etc. - continue to take metformin as prescribed - try to exercise/ walk some everyday- get outside daily - follow up with Dr. Moshe Cipro on 11/10, Dr. Halford Chessman 10/28,  Dr. Diona Fanti 11 Follow Up Plan: Telephone follow up appointment with care management team member scheduled for:   12/19/2020     Plan:Telephone follow up appointment with care management team member scheduled for:  12/19/2020   Jacqlyn Larsen [facility], BSN RN Case Manager [facility] [phone]

## 2020-10-31 NOTE — Patient Instructions (Signed)
Visit Information  PATIENT GOALS:  Goals Addressed             This Visit's Progress    Monitor and Manage My Blood Sugar-Diabetes Type 2       Timeframe:  Long-Range Goal Priority:  High Start Date:           06/15/2020                  Expected End Date:     12/15/2020                  Follow Up Date- 12/19/20   - you are currently not checking blood sugar- please be mindful of how you feel each day - low blood sugar symptoms- shaky, sweaty, nervous, dizzy- eat or drink if any of these symptoms - Try to limit or stop drinking sugary drinks - Be mindful of carbohydrate intake at each meal- too much rice, pasta, bread, starchy food will increase your blood sugar - Limit fast food and salt - avoid/ limit concentrated sweets such as cookies, pie, cake, etc. - continue to take metformin as prescribed - try to exercise/ walk some everyday- get outside daily - follow up with Dr. Moshe Cipro on 11/10, Dr. Halford Chessman 10/28,  Dr. Diona Fanti 11/1   Why is this important?   Checking your blood sugar at home helps to keep it from getting very high or very low.  Writing the results in a diary or log helps the doctor know how to care for you.  Your blood sugar log should have the time, date and the results.  Also, write down the amount of insulin or other medicine that you take.  Other information, like what you ate, exercise done and how you were feeling, will also be helpful.     Notes:      Track and Manage My Symptoms-COPD       Timeframe:  Long-Range Goal Priority:  High Start Date:      02/24/2020                       Expected End Date:   03/23/2021                 Follow Up Date 12/19/2020 Continue to ask friends for help as needed- practice energy conservation Alternate activity with rest as this keeps you from getting too tired Continue walking inside stores follow COPD rescue/ action plan -be mindful of how you are feeling each day call your doctor for any change in symptoms such as  coughing more than usual, inhalers are not helping or overall not feeling well Follow up with Dr. Halford Chessman (lung doctor) 10/28 and Dr. Moshe Cipro on 11/10 Practice deep breathing exercises daily Wear a mask as needed and wash your hands Use oxygen as needed and only as prescribed- do not increase 02 rate unless you discuss with your doctor first Be careful of oxygen tubing so that you do not trip      Why is this important?   Tracking your symptoms and other information about your health helps your doctor plan your care.  Write down the symptoms, the time of day, what you were doing and what medicine you are taking.  You will soon learn how to manage your symptoms.     Notes:         The patient verbalized understanding of instructions, educational materials, and care plan provided today and declined offer  to receive copy of patient instructions, educational materials, and care plan.   Telephone follow up appointment with care management team member scheduled for:   12/19/2020  Jacqlyn Larsen [facility], BSN RN Case Manager [facility] [phone]

## 2020-11-03 ENCOUNTER — Encounter: Payer: Self-pay | Admitting: Pulmonary Disease

## 2020-11-03 ENCOUNTER — Ambulatory Visit: Payer: Medicare HMO | Admitting: Pulmonary Disease

## 2020-11-03 ENCOUNTER — Other Ambulatory Visit: Payer: Self-pay

## 2020-11-03 VITALS — BP 132/88 | HR 88 | Temp 98.4°F | Ht 67.0 in | Wt 125.1 lb

## 2020-11-03 DIAGNOSIS — J9611 Chronic respiratory failure with hypoxia: Secondary | ICD-10-CM

## 2020-11-03 DIAGNOSIS — J471 Bronchiectasis with (acute) exacerbation: Secondary | ICD-10-CM

## 2020-11-03 DIAGNOSIS — J449 Chronic obstructive pulmonary disease, unspecified: Secondary | ICD-10-CM

## 2020-11-03 MED ORDER — AMOXICILLIN-POT CLAVULANATE 875-125 MG PO TABS: 1.0000 | ORAL_TABLET | Freq: Two times a day (BID) | ORAL | 0 refills | Status: DC

## 2020-11-03 NOTE — Progress Notes (Signed)
[facility]  Chief Complaint  Patient presents with   Follow-up    Coughing up green mucus. Congestion. SOB with exertion.      Constitutional:  BP 132/88 (BP Location: Left Arm)   Pulse 88   Temp 98.4 F (36.9 C) (Oral)   Ht 5\' 7"  (1.702 m)   Wt 125 lb 1.9 oz (56.8 kg)   SpO2 98%   BMI 19.60 kg/m   Past Medical History:  Pneumonia, CVA, Depression, HLD, HTN  Past Surgical History:  He  has no past surgical history on file.  Brief Summary:  [name] is a 70 y.o. male former cigarette and marijuana smoker with COPD with emphysema and bronchiectasis, and chronic hypoxic/hypercapnic respiratory failure.       Subjective:   Over the past 4 or 5 days he has more cough and chest congestion.  Feels burning in his lungs.  Has been bringing up green sputum.  Has intermittent wheeze that gets better when he coughs up phlegm.  He isn't sure if he had a fever, but has felt warmer.  Not having sinus congestion, sore throat, hemoptysis, GI symptoms, or leg swelling.  Using albuterol helps some.  Hasn't been using flutter valve or mucinex.  Physical Exam:   Appearance - well kempt   ENMT - no sinus tenderness, no oral exudate, no LAN, Mallampati 3 airway, no stridor, edentulous  Respiratory -  decreased breath sounds, scattered rhonchi, no wheeze  CV - s1s2 regular rate and rhythm, no murmurs  Ext - no clubbing, no edema  Skin - no rashes  Psych - normal mood and affect     Pulmonary testing:  ABG on 28% FiO2 12/11/18 >> pH 7.385, PCO2 50.2, PO2 102 PFT 07/29/19 >> FEV1 0.63 (24%), FEV1% 32, TLC 9.24 (143%), DLCO 63%  Chest Imaging:  CT chest 07/05/19 >> mild centrilobular and paraseptal emphysema, BTX lower lobes, bulla RLL  Social History:  He  reports that he quit smoking about 5 years ago. His smoking use included cigarettes. He has a 20.00 pack-year smoking history. He has never used smokeless tobacco. He reports  that he does not currently use alcohol after a past usage of about 3.0 standard drinks per week. He reports that he does not use drugs.  Family History:  His family history is not on file.     Assessment/Plan:   Severe COPD with emphysema. - steroid dependent; continue prednisone 5 mg daily - continue breztri, singulair, prn albuterol  Bronchiectasis. - likely from prior episodes of pneumonia - has an exacerbation - will give course of augmentin - advised him to use flutter valve and mucinex bid until symptoms improve  Chronic hypoxic/hypercapnic respiratory failure. - 3 liters oxygen with activity - uses [employer] for his DME  Social determinants of health. - he has limited reading ability which impacts his ability to follow through with medical instructions  Time Spent Involved in Patient Care on Day of Examination:  34 minutes  Follow up:   Patient Instructions  Augmentin antibiotic 1 pill twice per day for 7 days.  Breztri two puffs in the morning and two puffs in the evening, and rinse your mouth after each use.  Mucinex 1200 mg in the morning and the evening to help loosen phlegm in your chest.  Flutter valve twice per day to help loosen phlegm in your chest.  Uses this after using breztri.  Albuterol 2 puffs every 4 to 6  hours as needed for cough, wheeze, chest congestion, or shortness of breath.  Follow up in 4 months.  Medication List:   Allergies as of 11/03/2020   No Known Allergies      Medication List        Accurate as of November 03, 2020 10:55 AM. If you have any questions, ask your nurse or doctor.          STOP taking these medications    acetaminophen 500 MG tablet Commonly known as: TYLENOL Stopped by: Chesley Mires, MD       TAKE these medications    albuterol 108 (90 Base) MCG/ACT inhaler Commonly known as: VENTOLIN HFA INHALE 2 PUFFS INTO THE LUNGS EVERY FOUR HOURS AS NEEDED. ONLY IF YOU CAN'T CATCH YOUR BREATH.   albuterol  (2.5 MG/3ML) 0.083% nebulizer solution Commonly known as: PROVENTIL INHALE THE CONTENTS OF 1 VIAL VIA NEBULIZER EVERY 6 (SIX) HOURS AS NEEDED FOR WHEEZING OR SHORTNESS OF BREATH.   amLODipine 5 MG tablet Commonly known as: NORVASC Take 1 tablet (5 mg total) by mouth daily.   amoxicillin-clavulanate 875-125 MG tablet Commonly known as: AUGMENTIN Take 1 tablet by mouth 2 (two) times daily. Started by: Chesley Mires, MD   aspirin EC 81 MG tablet Take 81 mg by mouth daily. Swallow whole.   Breztri Aerosphere 160-9-4.8 MCG/ACT Aero Generic drug: Budeson-Glycopyrrol-Formoterol Inhale 2 puffs into the lungs 2 (two) times daily.   metFORMIN 500 MG tablet Commonly known as: GLUCOPHAGE Take 1 tablet (500 mg total) by mouth 2 (two) times daily with a meal.   montelukast 10 MG tablet Commonly known as: SINGULAIR Take 1 tablet (10 mg total) by mouth at bedtime.   pravastatin 40 MG tablet Commonly known as: PRAVACHOL TAKE 1 TABLET AT BEDTIME   predniSONE 5 MG tablet Commonly known as: DELTASONE Take 1 tablet (5 mg total) by mouth daily with breakfast.   triamterene-hydrochlorothiazide 37.5-25 MG tablet Commonly known as: MAXZIDE-25 TAKE 1 AND 1/2 TABLETS EVERY DAY        Signature:  Chesley Mires, MD [place] Pager - [phone] 11/03/2020, 10:55 AM

## 2020-11-03 NOTE — Patient Instructions (Signed)
Augmentin antibiotic 1 pill twice per day for 7 days.  Breztri two puffs in the morning and two puffs in the evening, and rinse your mouth after each use.  Mucinex 1200 mg in the morning and the evening to help loosen phlegm in your chest.  Flutter valve twice per day to help loosen phlegm in your chest.  Uses this after using breztri.  Albuterol 2 puffs every 4 to 6 hours as needed for cough, wheeze, chest congestion, or shortness of breath.  Follow up in 4 months.

## 2020-11-06 DIAGNOSIS — J449 Chronic obstructive pulmonary disease, unspecified: Secondary | ICD-10-CM

## 2020-11-06 DIAGNOSIS — E119 Type 2 diabetes mellitus without complications: Secondary | ICD-10-CM | POA: Diagnosis not present

## 2020-11-06 NOTE — Progress Notes (Signed)
H&P  Chief Complaint: New Dx of PCa  History of Present Illness: Patient is here today to discuss management of prostate cancer.  9.14.2022: TRUS/Bx. PSA 9.5, prostate volume 33.6 mL, PSAD 0.28.  1/12 cores (RT base lateral) revealed GG 2 pattern in 40 % of core.  He comes with his friend, Madelaine Etienne.  He denies significant lower urinary tract symptoms.  Kattan nomogram predictions  Extracapsular disease-59% Seminal vesicle/lymph node involvement-3% each 5/10-year progression free survival with radical prostatectomy-81/69%  Past Medical History:  Diagnosis Date   Asthma    CAP (community acquired pneumonia) 12/10/2018   COPD (chronic obstructive pulmonary disease) ([place])    COPD with acute exacerbation ([place]) 04/18/2018   CVA (cerebral vascular accident) ([place]) 2008   with temporary vision loss    Depression    ECHOCARDIOGRAM, ABNORMAL 03/06/2010   Qualifier: Diagnosis of  By: Purcell Nails, NP, Kathryn     Elevated PSA 2014   no diagnosis pf prostate cancer in 07/2014   History of substance abuse ([place]) 01/14/2011   Marijuana   History of substance abuse ([place]) 01/14/2011   History of tobacco abuse 01/14/2011   Hyperlipidemia    Hypertension    Lobar pneumonia ([place]) 12/13/2018   Marijuana abuse    Nicotine addiction    Prediabetes 2014    No past surgical history on file.  Home Medications:  Allergies as of 11/07/2020   No Known Allergies      Medication List        Accurate as of November 06, 2020  8:04 PM. If you have any questions, ask your nurse or doctor.          albuterol 108 (90 Base) MCG/ACT inhaler Commonly known as: VENTOLIN HFA INHALE 2 PUFFS INTO THE LUNGS EVERY FOUR HOURS AS NEEDED. ONLY IF YOU CAN'T CATCH YOUR BREATH.   albuterol (2.5 MG/3ML) 0.083% nebulizer solution Commonly known as: PROVENTIL INHALE THE CONTENTS OF 1 VIAL VIA NEBULIZER EVERY 6 (SIX) HOURS AS NEEDED FOR WHEEZING OR SHORTNESS OF BREATH.   amLODipine 5 MG tablet Commonly known as:  NORVASC Take 1 tablet (5 mg total) by mouth daily.   amoxicillin-clavulanate 875-125 MG tablet Commonly known as: AUGMENTIN Take 1 tablet by mouth 2 (two) times daily.   aspirin EC 81 MG tablet Take 81 mg by mouth daily. Swallow whole.   Breztri Aerosphere 160-9-4.8 MCG/ACT Aero Generic drug: Budeson-Glycopyrrol-Formoterol Inhale 2 puffs into the lungs 2 (two) times daily.   metFORMIN 500 MG tablet Commonly known as: GLUCOPHAGE Take 1 tablet (500 mg total) by mouth 2 (two) times daily with a meal.   montelukast 10 MG tablet Commonly known as: SINGULAIR Take 1 tablet (10 mg total) by mouth at bedtime.   pravastatin 40 MG tablet Commonly known as: PRAVACHOL TAKE 1 TABLET AT BEDTIME   predniSONE 5 MG tablet Commonly known as: DELTASONE Take 1 tablet (5 mg total) by mouth daily with breakfast.   triamterene-hydrochlorothiazide 37.5-25 MG tablet Commonly known as: MAXZIDE-25 TAKE 1 AND 1/2 TABLETS EVERY DAY        Allergies: No Known Allergies  No family history on file.  Social History:  reports that he quit smoking about 5 years ago. His smoking use included cigarettes. He has a 20.00 pack-year smoking history. He has never used smokeless tobacco. He reports that he does not currently use alcohol after a past usage of about 3.0 standard drinks per week. He reports that he does not use drugs.  ROS: A complete  review of systems was performed.  All systems are negative except for pertinent findings as noted.  Physical Exam:  Vital signs in last 24 hours: There were no vitals taken for this visit. Constitutional:  Alert and oriented, No acute distress Cardiovascular: Regular rate  Respiratory: Normal respiratory effort GI: Abdomen is soft, nontender, nondistended, no abdominal masses. No CVAT.  Genitourinary: Normal male phallus, testes are descended bilaterally and non-tender and without masses, scrotum is normal in appearance without lesions or masses, perineum is  normal on inspection. Lymphatic: No lymphadenopathy Neurologic: Grossly intact, no focal deficits Psychiatric: Normal mood and affect  I have reviewed prior pt notes  I have reviewed urinalysis results  I have independently reviewed prior imaging  I have reviewed prior PSA/path results    Impression/Assessment:  PCa--low volume favorable intermediate risk  Plan:  The patient (and family member present) was/were counseled about the natural history of prostate cancer and the standard treatment options that are available for prostate cancer. It was explained to him how his age and life expectancy, clinical stage, Gleason score, and PSA affect his prognosis, the decision to proceed with additional staging studies, as well as how that information influences recommended treatment strategies. We discussed the roles for active surveillance, radiation therapy, surgical therapy, androgen deprivation, as well as ablative therapy options for the treatment of prostate cancer as appropriate to his individual cancer situation. We discussed the risks and benefits of these options with regard to their impact on cancer control and also in terms of potential adverse events, complications, and impact on quality of life particularly related to urinary, bowel, and sexual function. The patient was encouraged to ask questions throughout the discussion today and all questions were answered to his stated satisfaction. In addition, the patient was provided with and/or directed to appropriate resources and literature for further education about prostate cancer and treatment options.  He does have a high PSA density.  I have suggested that we consider brachytherapy over active surveillance.  I discussed this with the patient and his friend.  They agree with this, I will set up an appointment for him to see Dr. Tammi Klippel at the regional [place]

## 2020-11-07 ENCOUNTER — Other Ambulatory Visit: Payer: Self-pay

## 2020-11-07 ENCOUNTER — Ambulatory Visit: Payer: Medicare HMO | Admitting: Urology

## 2020-11-07 DIAGNOSIS — C61 Malignant neoplasm of prostate: Secondary | ICD-10-CM | POA: Diagnosis not present

## 2020-11-07 DIAGNOSIS — R972 Elevated prostate specific antigen [PSA]: Secondary | ICD-10-CM | POA: Diagnosis not present

## 2020-11-09 ENCOUNTER — Telehealth: Payer: Self-pay | Admitting: Family Medicine

## 2020-11-09 ENCOUNTER — Other Ambulatory Visit: Payer: Self-pay

## 2020-11-09 MED ORDER — MONTELUKAST SODIUM 10 MG PO TABS: 10.0000 mg | ORAL_TABLET | Freq: Every day | ORAL | 3 refills | Status: DC

## 2020-11-09 NOTE — Telephone Encounter (Signed)
Please call this in montelukast (SINGULAIR) 10 MG tablet

## 2020-11-16 ENCOUNTER — Encounter: Payer: Self-pay | Admitting: Family Medicine

## 2020-11-16 ENCOUNTER — Other Ambulatory Visit: Payer: Self-pay

## 2020-11-16 ENCOUNTER — Ambulatory Visit (INDEPENDENT_AMBULATORY_CARE_PROVIDER_SITE_OTHER): Payer: Medicare HMO | Admitting: Family Medicine

## 2020-11-16 VITALS — BP 130/84 | HR 96 | Resp 18 | Ht 67.0 in | Wt 126.0 lb

## 2020-11-16 DIAGNOSIS — J449 Chronic obstructive pulmonary disease, unspecified: Secondary | ICD-10-CM

## 2020-11-16 DIAGNOSIS — I10 Essential (primary) hypertension: Secondary | ICD-10-CM | POA: Diagnosis not present

## 2020-11-16 DIAGNOSIS — E099 Drug or chemical induced diabetes mellitus without complications: Secondary | ICD-10-CM

## 2020-11-16 DIAGNOSIS — E785 Hyperlipidemia, unspecified: Secondary | ICD-10-CM

## 2020-11-16 DIAGNOSIS — E119 Type 2 diabetes mellitus without complications: Secondary | ICD-10-CM

## 2020-11-16 MED ORDER — METFORMIN HCL 500 MG PO TABS
500.0000 mg | ORAL_TABLET | Freq: Two times a day (BID) | ORAL | 1 refills | Status: DC
Start: 2020-11-16 — End: 2020-11-22

## 2020-11-16 MED ORDER — AMLODIPINE BESYLATE 5 MG PO TABS
5.0000 mg | ORAL_TABLET | Freq: Every day | ORAL | 1 refills | Status: DC
Start: 2020-11-16 — End: 2021-07-20

## 2020-11-16 MED ORDER — ALBUTEROL SULFATE (2.5 MG/3ML) 0.083% IN NEBU: INHALATION_SOLUTION | RESPIRATORY_TRACT | 1 refills | Status: DC

## 2020-11-16 MED ORDER — MONTELUKAST SODIUM 10 MG PO TABS: 10.0000 mg | ORAL_TABLET | Freq: Every day | ORAL | 1 refills | Status: DC

## 2020-11-16 MED ORDER — ALBUTEROL SULFATE HFA 108 (90 BASE) MCG/ACT IN AERS: INHALATION_SPRAY | RESPIRATORY_TRACT | 5 refills | Status: DC

## 2020-11-16 MED ORDER — PRAVASTATIN SODIUM 40 MG PO TABS: ORAL_TABLET | ORAL | 1 refills | Status: DC

## 2020-11-16 MED ORDER — TRIAMTERENE-HCTZ 37.5-25 MG PO TABS: ORAL_TABLET | ORAL | 1 refills | Status: DC

## 2020-11-16 MED ORDER — PREDNISONE 5 MG PO TABS: 5.0000 mg | ORAL_TABLET | Freq: Every day | ORAL | 2 refills | Status: DC

## 2020-11-16 NOTE — Patient Instructions (Addendum)
Keep appointment in Feb as before, call if you need me sooner  hBA1C, chem 7 and EGFr today  Covid past due please get this   Nurse please discuss with patient changing [facility], he says not getting medications on time and wants to swictch back, he dOES have all hies meds and reports having extra at home so not sure, but probabaly bubble packing is a good option for him, see how you can sort this out please!  Thanks for choosing [facility], we consider it a privelige to serve you.

## 2020-11-17 LAB — HEMOGLOBIN A1C
Est. average glucose Bld gHb Est-mCnc: 131 mg/dL
Hgb A1c MFr Bld: 6.2 % — ABNORMAL HIGH (ref 4.8–5.6)

## 2020-11-17 LAB — BMP8+EGFR
BUN/Creatinine Ratio: 12 (ref 10–24)
BUN: 11 mg/dL (ref 8–27)
CO2: 29 mmol/L (ref 20–29)
Calcium: 9.8 mg/dL (ref 8.6–10.2)
Chloride: 98 mmol/L (ref 96–106)
Creatinine, Ser: 0.9 mg/dL (ref 0.76–1.27)
Glucose: 107 mg/dL — ABNORMAL HIGH (ref 70–99)
Potassium: 3.7 mmol/L (ref 3.5–5.2)
Sodium: 139 mmol/L (ref 134–144)
eGFR: 91 mL/min/{1.73_m2} (ref >59)

## 2020-11-18 ENCOUNTER — Encounter: Payer: Self-pay | Admitting: Family Medicine

## 2020-11-18 NOTE — Assessment & Plan Note (Signed)
[name] is reminded of the importance of commitment to daily physical activity for 30 minutes or more, as able and the need to limit carbohydrate intake to 30 to 60 grams per meal to help with blood sugar control.   The need to take medication as prescribed, test blood sugar as directed, and to call between visits if there is a concern that blood sugar is uncontrolled is also discussed.   [name] is reminded of the importance of daily foot exam, annual eye examination, and good blood sugar, blood pressure and cholesterol control.  Controlled, no change in medication  Diabetic Labs Latest Ref Rng & Units 11/16/2020 08/14/2020 04/05/2020 11/25/2019 07/29/2019  HbA1c 4.8 - 5.6 % 6.2(H) 6.3(H) 7.9(A) - -  Chol 100 - 199 mg/dL - 176 - - -  HDL >39 mg/dL - 82 - - -  Calc LDL 0 - 99 mg/dL - 83 - - -  Triglycerides 0 - 149 mg/dL - 56 - - -  Creatinine 0.76 - 1.27 mg/dL 0.90 0.85 - 0.80 0.76   BP/Weight 11/16/2020 11/03/2020 09/19/2020 09/07/2020 09/05/2020 07/05/2020 01/31/5807  Systolic BP 983 382 505 397 673 419 379  Diastolic BP 84 88 71 75 73 80 90  Wt. (Lbs) 126 125.12 - 120 - 124 129.2  BMI 19.73 19.6 - 18.79 - 19.42 20.24   Foot/eye exam completion dates Latest Ref Rng & Units 06/06/2020 06/06/2020  Eye Exam No Retinopathy No Retinopathy No Retinopathy  Foot Form Completion - - -

## 2020-11-18 NOTE — Progress Notes (Signed)
[name]     [name]: [id number]      DOB: 1950-07-06   HPI [name] is here for follow up and re-evaluation of chronic medical conditions, medication management and review of any available recent lab and radiology data.  Preventive health is updated, specifically  Cancer screening and Immunization.   Questions or concerns regarding consultations or procedures which the PT has had in the interim are  addressed. The PT denies any adverse reactions to current medications since the last visit.  There are no new concerns.  There are no specific complaints   ROS Denies recent fever or chills. Denies sinus pressure, nasal congestion, ear pain or sore throat. Denies chest congestion, productive cough or wheezing. Denies chest pains, palpitations and leg swelling Denies abdominal pain, nausea, vomiting,diarrhea or constipation.   Denies dysuria, frequency, hesitancy or incontinence. Denies joint pain, swelling and limitation in mobility. Denies headaches, seizures, numbness, or tingling. Denies depression, anxiety or insomnia. Denies skin break down or rash.   PE  BP 130/84   Pulse 96   Resp 18   Ht 5\' 7"  (1.702 m)   Wt 126 lb (57.2 kg)   SpO2 94%   BMI 19.73 kg/m    Patient alert and oriented and in no cardiopulmonary distress.  HEENT: No facial asymmetry, EOMI,     Neck supple .  Chest: Clear to auscultation bilaterally.decreased air entry  CVS: S1, S2 no murmurs, no S3.Regular rate.  ABD: Soft non tender.   Ext: No edema  MS: Adequate ROM spine, shoulders, hips and knees.  Skin: Intact, no ulcerations or rash noted.  Psych: Good eye contact, normal affect. Memory intact not anxious or depressed appearing.  CNS: CN 2-12 intact, power,  normal throughout.no focal deficits noted.   Assessment & Plan  Essential hypertension Controlled, no change in medication DASH diet and commitment to daily physical activity for a minimum of 30 minutes discussed and  encouraged, as a part of hypertension management. The importance of attaining a healthy weight is also discussed.  BP/Weight 11/16/2020 11/03/2020 09/19/2020 09/07/2020 09/05/2020 07/05/2020 07/15/6281  Systolic BP 662 947 654 650 354 656 812  Diastolic BP 84 88 71 75 73 80 90  Wt. (Lbs) 126 125.12 - 120 - 124 129.2  BMI 19.73 19.6 - 18.79 - 19.42 20.24       Hyperlipidemia Hyperlipidemia:Low fat diet discussed and encouraged.   Lipid Panel  Lab Results  Component Value Date   CHOL 176 08/14/2020   HDL 82 08/14/2020   LDLCALC 83 08/14/2020   TRIG 56 08/14/2020   CHOLHDL 2.1 08/14/2020       DM (diabetes mellitus) [name]) [name] is reminded of the importance of commitment to daily physical activity for 30 minutes or more, as able and the need to limit carbohydrate intake to 30 to 60 grams per meal to help with blood sugar control.   The need to take medication as prescribed, test blood sugar as directed, and to call between visits if there is a concern that blood sugar is uncontrolled is also discussed.   [name] is reminded of the importance of daily foot exam, annual eye examination, and good blood sugar, blood pressure and cholesterol control.  Controlled, no change in medication  Diabetic Labs Latest Ref Rng & Units 11/16/2020 08/14/2020 04/05/2020 11/25/2019 07/29/2019  HbA1c 4.8 - 5.6 % 6.2(H) 6.3(H) 7.9(A) - -  Chol 100 - 199 mg/dL - 176 - - -  HDL >39  mg/dL - 82 - - -  Calc LDL 0 - 99 mg/dL - 83 - - -  Triglycerides 0 - 149 mg/dL - 56 - - -  Creatinine 0.76 - 1.27 mg/dL 0.90 0.85 - 0.80 0.76   BP/Weight 11/16/2020 11/03/2020 09/19/2020 09/07/2020 09/05/2020 07/05/2020 7/39/5844  Systolic BP 171 278 718 367 255 001 642  Diastolic BP 84 88 71 75 73 80 90  Wt. (Lbs) 126 125.12 - 120 - 124 129.2  BMI 19.73 19.6 - 18.79 - 19.42 20.24   Foot/eye exam completion dates Latest Ref Rng & Units 06/06/2020 06/06/2020  Eye Exam No Retinopathy No Retinopathy No Retinopathy  Foot  Form Completion - - -        Asthma with COPD (chronic obstructive pulmonary disease) Stable , continue current medication

## 2020-11-18 NOTE — Assessment & Plan Note (Signed)
Stable, continue current medication.

## 2020-11-18 NOTE — Assessment & Plan Note (Signed)
Hyperlipidemia:Low fat diet discussed and encouraged.   Lipid Panel  Lab Results  Component Value Date   CHOL 176 08/14/2020   HDL 82 08/14/2020   LDLCALC 83 08/14/2020   TRIG 56 08/14/2020   CHOLHDL 2.1 08/14/2020

## 2020-11-18 NOTE — Assessment & Plan Note (Signed)
Controlled, no change in medication DASH diet and commitment to daily physical activity for a minimum of 30 minutes discussed and encouraged, as a part of hypertension management. The importance of attaining a healthy weight is also discussed.  BP/Weight 11/16/2020 11/03/2020 09/19/2020 09/07/2020 09/05/2020 07/05/2020 7/61/6073  Systolic BP 710 626 948 546 270 350 093  Diastolic BP 84 88 71 75 73 80 90  Wt. (Lbs) 126 125.12 - 120 - 124 129.2  BMI 19.73 19.6 - 18.79 - 19.42 20.24

## 2020-11-21 ENCOUNTER — Ambulatory Visit: Payer: Medicare HMO | Admitting: Urology

## 2020-11-22 ENCOUNTER — Other Ambulatory Visit: Payer: Self-pay | Admitting: Internal Medicine

## 2020-11-22 DIAGNOSIS — E119 Type 2 diabetes mellitus without complications: Secondary | ICD-10-CM

## 2020-11-23 ENCOUNTER — Telehealth: Payer: Self-pay | Admitting: Family Medicine

## 2020-11-23 NOTE — Telephone Encounter (Signed)
All his meds were sent to [facility] on 11/10

## 2020-11-23 NOTE — Telephone Encounter (Signed)
Please make sure that all medications go to [facility]

## 2020-11-26 ENCOUNTER — Other Ambulatory Visit: Payer: Self-pay | Admitting: Pulmonary Disease

## 2020-12-01 DIAGNOSIS — Z1211 Encounter for screening for malignant neoplasm of colon: Secondary | ICD-10-CM | POA: Diagnosis not present

## 2020-12-06 LAB — COLOGUARD: COLOGUARD: NEGATIVE

## 2020-12-07 ENCOUNTER — Ambulatory Visit (INDEPENDENT_AMBULATORY_CARE_PROVIDER_SITE_OTHER): Payer: Medicare HMO

## 2020-12-07 ENCOUNTER — Other Ambulatory Visit: Payer: Self-pay

## 2020-12-07 DIAGNOSIS — J449 Chronic obstructive pulmonary disease, unspecified: Secondary | ICD-10-CM

## 2020-12-07 DIAGNOSIS — Z0000 Encounter for general adult medical examination without abnormal findings: Secondary | ICD-10-CM | POA: Diagnosis not present

## 2020-12-07 MED ORDER — LANCETS 30G MISC: 5 refills | Status: DC

## 2020-12-07 MED ORDER — GLUCOSE BLOOD VI STRP: ORAL_STRIP | 5 refills | Status: DC

## 2020-12-07 MED ORDER — BLOOD GLUCOSE METER KIT: PACK | 0 refills | Status: DC

## 2020-12-07 NOTE — Progress Notes (Addendum)
I connected with  [name] on 12/07/20 by a audio enabled telemedicine application and verified that I am speaking with the correct person using two identifiers.  Patient Location: Home  Provider Location: Office/Clinic  I discussed the limitations of evaluation and management by telemedicine. The patient expressed understanding and agreed to proceed.   Subjective:   [name] is a 70 y.o. male who presents for Medicare Annual/Subsequent preventive examination.  Review of Systems     Cardiac Risk Factors include: advanced age (>34men, >17 women);hypertension;sedentary lifestyle     Objective:    There were no vitals filed for this visit. There is no height or weight on file to calculate BMI.  Advanced Directives 12/01/2019 07/29/2019 05/26/2019 04/09/2019 03/26/2019 03/11/2019 12/10/2018  Does Patient Have a Medical Advance Directive? No No No No No No No  Would patient like information on creating a medical advance directive? No - Patient declined - No - Patient declined No - Patient declined No - Patient declined No - Patient declined No - Patient declined  Pre-existing out of facility DNR order (yellow form or pink MOST form) - - - - - - -    Current Medications (verified) Outpatient Encounter Medications as of 12/07/2020  Medication Sig   albuterol (PROVENTIL) (2.5 MG/3ML) 0.083% nebulizer solution INHALE THE CONTENTS OF 1 VIAL VIA NEBULIZER EVERY 6 (SIX) HOURS AS NEEDED FOR WHEEZING OR SHORTNESS OF BREATH.   albuterol (VENTOLIN HFA) 108 (90 Base) MCG/ACT inhaler INHALE 2 PUFFS INTO THE LUNGS EVERY FOUR HOURS AS NEEDED. ONLY IF YOU CAN'T CATCH YOUR BREATH.   amLODipine (NORVASC) 5 MG tablet Take 1 tablet (5 mg total) by mouth daily.   aspirin EC 81 MG tablet Take 81 mg by mouth daily. Swallow whole.   BREZTRI AEROSPHERE 160-9-4.8 MCG/ACT AERO INHALE 2 PUFFS FIRST THING IN THE MORNING AND THEN ANOTHER 2 PUFFS ABOUT 12 HOURS LATER.   fluticasone (FLONASE) 50 MCG/ACT nasal  spray Place 1 spray into both nostrils daily.   metFORMIN (GLUCOPHAGE) 500 MG tablet TAKE 1 TABLET TWICE DAILY WITH MEALS   montelukast (SINGULAIR) 10 MG tablet Take 1 tablet (10 mg total) by mouth at bedtime.   pravastatin (PRAVACHOL) 40 MG tablet TAKE 1 TABLET AT BEDTIME   predniSONE (DELTASONE) 5 MG tablet Take 1 tablet (5 mg total) by mouth daily with breakfast.   triamterene-hydrochlorothiazide (MAXZIDE-25) 37.5-25 MG tablet TAKE 1 AND 1/2 TABLETS EVERY DAY   [DISCONTINUED] simvastatin (ZOCOR) 40 MG tablet Take 40 mg by mouth daily.   No facility-administered encounter medications on file as of 12/07/2020.    Allergies (verified) Patient has no known allergies.   History: Past Medical History:  Diagnosis Date   Asthma    CAP (community acquired pneumonia) 12/10/2018   COPD (chronic obstructive pulmonary disease) ([place])    COPD with acute exacerbation ([place]) 04/18/2018   CVA (cerebral vascular accident) ([place]) 2008   with temporary vision loss    Depression    ECHOCARDIOGRAM, ABNORMAL 03/06/2010   Qualifier: Diagnosis of  By: Purcell Nails, NP, Kathryn     Elevated PSA 2014   no diagnosis pf prostate cancer in 07/2014   History of substance abuse ([place]) 01/14/2011   Marijuana   History of substance abuse ([place]) 01/14/2011   History of tobacco abuse 01/14/2011   Hyperlipidemia    Hypertension    Lobar pneumonia ([place]) 12/13/2018   Marijuana abuse    Nicotine addiction    Prediabetes 2014   No past  surgical history on file. No family history on file. Social History   Socioeconomic History   Marital status: Widowed    Spouse name: Not on file   Number of children: 1   Years of education: 33 the grade   Highest education level: 11th grade  Occupational History   Occupation: unemployed, retired  Tobacco Use   Smoking status: Former    Packs/day: 0.50    Years: 40.00    Pack years: 20.00    Types: Cigarettes    Quit date: 04/14/2015    Years since quitting: 5.6   Smokeless tobacco:  Never  Vaping Use   Vaping Use: Never used  Substance and Sexual Activity   Alcohol use: Not Currently    Alcohol/week: 3.0 standard drinks    Types: 3 Cans of beer per week    Comment: occasionally   Drug use: No    Comment: pt quit 20 years ago   Sexual activity: Not Currently  Other Topics Concern   Not on file  Social History Narrative   Not on file   Social Determinants of Health   Financial Resource Strain: Low Risk    Difficulty of Paying Living Expenses: Not very hard  Food Insecurity: No Food Insecurity   Worried About Charity fundraiser in the Last Year: Never true   [place] in the Last Year: Never true  Transportation Needs: No Transportation Needs   Lack of Transportation (Medical): No   Lack of Transportation (Non-Medical): No  Physical Activity: Inactive   Days of Exercise per Week: 0 days   Minutes of Exercise per Session: 0 min  Stress: No Stress Concern Present   Feeling of Stress : Only a little  Social Connections: Socially Isolated   Frequency of Communication with Friends and Family: Once a week   Frequency of Social Gatherings with Friends and Family: Never   Attends Religious Services: Never   Marine scientist or Organizations: No   Attends Archivist Meetings: Never   Marital Status: Widowed    Tobacco Counseling Counseling given: Not Answered   Clinical Intake:  Pre-visit preparation completed: No  Pain : No/denies pain     Nutritional Status: BMI <19  Underweight Diabetes: No  How often do you need to have someone help you when you read instructions, pamphlets, or other written materials from your doctor or pharmacy?: 3 - Sometimes  Diabetic?controlled- prediabetic range  Interpreter Needed?: No      Activities of Daily Living In your present state of health, do you have any difficulty performing the following activities: 12/07/2020  Hearing? N  Vision? N  Difficulty concentrating or making  decisions? N  Walking or climbing stairs? Y  Dressing or bathing? N  Doing errands, shopping? N  Preparing Food and eating ? N  Using the Toilet? N  In the past six months, have you accidently leaked urine? N  Do you have problems with loss of bowel control? N  Managing your Medications? N  Managing your Finances? N  Housekeeping or managing your Housekeeping? N  Some recent data might be hidden    Patient Care Team: Fayrene Helper, MD as PCP - General Irine Seal, MD as Attending Physician (Urology) Gala Romney, Cristopher Estimable, MD as Consulting Physician (Gastroenterology) Danie Binder, MD (Inactive) as Consulting Physician (Gastroenterology) Perlie Mayo, NP as Nurse Practitioner (Family Medicine) Tanda Rockers, MD as Consulting Physician (Pulmonary Disease) Kassie Mends, RN as  [facility]  Indicate any recent Medical Services you may have received from other than [facility] providers in the past year (date may be approximate).     Assessment:   This is a routine wellness examination for Jule.  Hearing/Vision screen No results found.  Dietary issues and exercise activities discussed: Current Exercise Habits: The patient does not participate in regular exercise at present, Exercise limited by: respiratory conditions(s)   Goals Addressed             This Visit's Progress    Monitor and Manage My Blood Sugar-Diabetes Type 2   On track    Timeframe:  Long-Range Goal Priority:  High Start Date:           06/15/2020                  Expected End Date:     12/15/2020                  Follow Up Date- 12/19/20   - you are currently not checking blood sugar- please be mindful of how you feel each day - low blood sugar symptoms- shaky, sweaty, nervous, dizzy- eat or drink if any of these symptoms - Try to limit or stop drinking sugary drinks - Be mindful of carbohydrate intake at each meal- too much rice, pasta, bread, starchy food will increase  your blood sugar - Limit fast food and salt - avoid/ limit concentrated sweets such as cookies, pie, cake, etc. - continue to take metformin as prescribed - try to exercise/ walk some everyday- get outside daily - follow up with Dr. Moshe Cipro on 11/10, Dr. Halford Chessman 10/28,  Dr. Diona Fanti 11/1   Why is this important?   Checking your blood sugar at home helps to keep it from getting very high or very low.  Writing the results in a diary or log helps the doctor know how to care for you.  Your blood sugar log should have the time, date and the results.  Also, write down the amount of insulin or other medicine that you take.  Other information, like what you ate, exercise done and how you were feeling, will also be helpful.     Notes:      Track and Manage My Blood Pressure-Hypertension   On track    Timeframe:  Long-Range Goal Priority:  High Start Date:     02/24/2020                        Expected End Date:      08/23/2020                 Follow Up Date 08/17/2020   Your blood pressure continues to be elevated Choose a place to take my blood pressure (home, clinic or office, retail store)  Take medications as prescribed and keep refilled on time Avoid/limit salty foods such as chips, fast food, canned soups, etc. Continue to ask your friend to look at food labels for you to see salt content Choose fresh or frozen foods as much as you can Follow up with primary doctor 08/28/20 Call RN care manager if you have any questions at [phone]   Why is this important?   You won't feel high blood pressure, but it can still hurt your blood vessels.  High blood pressure can cause heart or kidney problems. It can also cause a stroke.  Making lifestyle changes like  losing a little weight or eating less salt will help.  Checking your blood pressure at home and at different times of the day can help to control blood pressure.  If the doctor prescribes medicine remember to take it the way the doctor  ordered.  Call the office if you cannot afford the medicine or if there are questions about it.     Notes:      Track and Manage My Symptoms-COPD   On track    Timeframe:  Long-Range Goal Priority:  High Start Date:      02/24/2020                       Expected End Date:   03/23/2021                 Follow Up Date 12/19/2020 Continue to ask friends for help as needed- practice energy conservation Alternate activity with rest as this keeps you from getting too tired Continue walking inside stores follow COPD rescue/ action plan -be mindful of how you are feeling each day call your doctor for any change in symptoms such as coughing more than usual, inhalers are not helping or overall not feeling well Follow up with Dr. Halford Chessman (lung doctor) 10/28 and Dr. Moshe Cipro on 11/10 Practice deep breathing exercises daily Wear a mask as needed and wash your hands Use oxygen as needed and only as prescribed- do not increase 02 rate unless you discuss with your doctor first Be careful of oxygen tubing so that you do not trip      Why is this important?   Tracking your symptoms and other information about your health helps your doctor plan your care.  Write down the symptoms, the time of day, what you were doing and what medicine you are taking.  You will soon learn how to manage your symptoms.     Notes:        Depression Screen PHQ 2/9 Scores 11/16/2020 04/05/2020 02/24/2020 01/12/2020 12/14/2019 12/13/2019 12/01/2019  PHQ - 2 Score 0 0 0 0 0 0 2  PHQ- 9 Score 0 - - - - - 5  Exception Documentation - - - - - - -    Fall Risk Fall Risk  12/07/2020 11/16/2020 09/07/2020 09/07/2020 07/05/2020  Falls in the past year? 0 0 0 0 0  Number falls in past yr: 0 - 0 0 0  Injury with Fall? 0 - 0 0 0  Risk for fall due to : - - - - -  Follow up - - - - -    [place]:  Any stairs in or around the home? No  If so, are there any without handrails? No  Home free of loose  throw rugs in walkways, pet beds, electrical cords, etc? Yes  Adequate lighting in your home to reduce risk of falls? Yes   ASSISTIVE DEVICES UTILIZED TO PREVENT FALLS:  Life alert? No  Use of a cane, walker or w/c? No  Grab bars in the bathroom? No  Shower chair or bench in shower? No  Elevated toilet seat or a handicapped toilet? No   Cognitive Function: MMSE - Mini Mental State Exam 03/04/2016  Orientation to time 2  Orientation to Place 5  Registration 3  Attention/ Calculation 0  Attention/Calculation-comments unable to spell  Recall 3  Language- name 2 objects 2  Language- repeat 1  Language- follow 3 step command 3  Language- read & follow direction 0  Language-read & follow direction-comments unable to read and write  Write a sentence 1  Copy design 1  Total score 21     6CIT Screen 12/07/2020 12/01/2019 11/30/2018 03/24/2018  What Year? 0 points 0 points 0 points 0 points  What month? 0 points 0 points 3 points 0 points  What time? 0 points 0 points 0 points 0 points  Count back from 20 0 points 0 points 0 points 2 points  Months in reverse 2 points 4 points 4 points 4 points  Repeat phrase 2 points 2 points 2 points 2 points  Total Score 4 6 9 8     Immunizations Immunization History  Administered Date(s) Administered   Fluad Quad(high Dose 65+) 08/31/2018, 10/15/2019, 09/12/2020   Influenza Split 10/23/2011   Influenza Whole 10/02/2010   Influenza,inj,Quad PF,6+ Mos 11/11/2012, 11/10/2013, 10/11/2014, 08/30/2015, 09/19/2016, 08/29/2017   Moderna SARS-COV2 Booster Vaccination 10/01/2019   Moderna Sars-Covid-2 Vaccination 03/03/2019, 03/31/2019, 12/14/2019, 07/07/2020, 11/17/2020   Pneumococcal Conjugate-13 03/28/2014   Pneumococcal Polysaccharide-23 05/30/2010, 06/19/2015   Td 09/22/2003   Zoster Recombinat (Shingrix) 11/20/2018    TDAP status: Up to date  Flu Vaccine status: Up to date  Pneumococcal vaccine status: Up to date  Covid-19 vaccine  status: Completed vaccines  Qualifies for Shingles Vaccine? Yes   Zostavax completed No   Shingrix Completed?: No.    Education has been provided regarding the importance of this vaccine. Patient has been advised to call insurance company to determine out of pocket expense if they have not yet received this vaccine. Advised may also receive vaccine at local pharmacy or Health Dept. Verbalized acceptance and understanding.  Screening Tests Health Maintenance  Topic Date Due   TETANUS/TDAP  09/21/2013   COLONOSCOPY (Pts 45-66yrs Insurance coverage will need to be confirmed)  07/14/2016   Zoster Vaccines- Shingrix (2 of 2) 01/15/2019   COVID-19 Vaccine (6 - Booster for Moderna series) 01/12/2021   HEMOGLOBIN A1C  05/16/2021   Pneumonia Vaccine 53+ Years old  Completed   INFLUENZA VACCINE  Completed   Hepatitis C Screening  Completed   HPV VACCINES  Aged Out    Health Maintenance  Health Maintenance Due  Topic Date Due   TETANUS/TDAP  09/21/2013   COLONOSCOPY (Pts 45-36yrs Insurance coverage will need to be confirmed)  07/14/2016   Zoster Vaccines- Shingrix (2 of 2) 01/15/2019    Colorectal cancer screening: Referral to GI placed yes. Pt aware the office will call re: appt.  Lung Cancer Screening: (Low Dose CT Chest recommended if Age 59-80 years, 30 pack-year currently smoking OR have quit w/in 15years.) does not qualify.   Lung Cancer Screening Referral: na  Additional Screening:  Hepatitis C Screening: does qualify; Completed   Vision Screening: Recommended annual ophthalmology exams for early detection of glaucoma and other disorders of the eye. Is the patient up to date with their annual eye exam?  Yes  Who is the provider or what is the name of the office in which the patient attends annual eye exams? [facility] If pt is not established with a provider, would they like to be referred to a provider to establish care? No .   Dental Screening: Recommended annual dental exams  for proper oral hygiene  Community Resource Referral / Chronic Care Management: CRR required this visit?  No   CCM required this visit?  Yes      Plan:     I have personally reviewed and noted  the following in the patient's chart:   Medical and social history Use of alcohol, tobacco or illicit drugs  Current medications and supplements including opioid prescriptions. Patient is not currently taking opioid prescriptions. Functional ability and status Nutritional status Physical activity Advanced directives List of other physicians Hospitalizations, surgeries, and ER visits in previous 12 months Vitals Screenings to include cognitive, depression, and falls Referrals and appointments  In addition, I have reviewed and discussed with patient certain preventive protocols, quality metrics, and best practice recommendations. A written personalized care plan for preventive services as well as general preventive health recommendations were provided to patient.     Kate Sable, LPN, LPN   [id number]   Nurse Notes:  Mr. Talent , Thank you for taking time to come for your Medicare Wellness Visit. I appreciate your ongoing commitment to your health goals. Please review the following plan we discussed and let me know if I can assist you in the future.   These are the goals we discussed:  Goals      Monitor and Manage My Blood Sugar-Diabetes Type 2     Timeframe:  Long-Range Goal Priority:  High Start Date:           06/15/2020                  Expected End Date:     12/15/2020                  Follow Up Date- 12/19/20   - you are currently not checking blood sugar- please be mindful of how you feel each day - low blood sugar symptoms- shaky, sweaty, nervous, dizzy- eat or drink if any of these symptoms - Try to limit or stop drinking sugary drinks - Be mindful of carbohydrate intake at each meal- too much rice, pasta, bread, starchy food will increase your blood sugar - Limit fast  food and salt - avoid/ limit concentrated sweets such as cookies, pie, cake, etc. - continue to take metformin as prescribed - try to exercise/ walk some everyday- get outside daily - follow up with Dr. Moshe Cipro on 11/10, Dr. Halford Chessman 10/28,  Dr. Diona Fanti 11/1   Why is this important?   Checking your blood sugar at home helps to keep it from getting very high or very low.  Writing the results in a diary or log helps the doctor know how to care for you.  Your blood sugar log should have the time, date and the results.  Also, write down the amount of insulin or other medicine that you take.  Other information, like what you ate, exercise done and how you were feeling, will also be helpful.     Notes:      Track and Manage My Blood Pressure-Hypertension     Timeframe:  Long-Range Goal Priority:  High Start Date:     02/24/2020                        Expected End Date:      08/23/2020                 Follow Up Date 08/17/2020   Your blood pressure continues to be elevated Choose a place to take my blood pressure (home, clinic or office, retail store)  Take medications as prescribed and keep refilled on time Avoid/limit salty foods such as chips, fast food, canned soups, etc. Continue to ask your friend to look at  food labels for you to see salt content Choose fresh or frozen foods as much as you can Follow up with primary doctor 08/28/20 Call RN care manager if you have any questions at [phone]   Why is this important?   You won't feel high blood pressure, but it can still hurt your blood vessels.  High blood pressure can cause heart or kidney problems. It can also cause a stroke.  Making lifestyle changes like losing a little weight or eating less salt will help.  Checking your blood pressure at home and at different times of the day can help to control blood pressure.  If the doctor prescribes medicine remember to take it the way the doctor ordered.  Call the office if you cannot  afford the medicine or if there are questions about it.     Notes:      Track and Manage My Symptoms-COPD     Timeframe:  Long-Range Goal Priority:  High Start Date:      02/24/2020                       Expected End Date:   03/23/2021                 Follow Up Date 12/19/2020 Continue to ask friends for help as needed- practice energy conservation Alternate activity with rest as this keeps you from getting too tired Continue walking inside stores follow COPD rescue/ action plan -be mindful of how you are feeling each day call your doctor for any change in symptoms such as coughing more than usual, inhalers are not helping or overall not feeling well Follow up with Dr. Halford Chessman (lung doctor) 10/28 and Dr. Moshe Cipro on 11/10 Practice deep breathing exercises daily Wear a mask as needed and wash your hands Use oxygen as needed and only as prescribed- do not increase 02 rate unless you discuss with your doctor first Be careful of oxygen tubing so that you do not trip      Why is this important?   Tracking your symptoms and other information about your health helps your doctor plan your care.  Write down the symptoms, the time of day, what you were doing and what medicine you are taking.  You will soon learn how to manage your symptoms.     Notes:         This is a list of the screening recommended for you and due dates:  Health Maintenance  Topic Date Due   Tetanus Vaccine  09/21/2013   Colon Cancer Screening  07/14/2016   Zoster (Shingles) Vaccine (2 of 2) 01/15/2019   COVID-19 Vaccine (6 - Booster for Moderna series) 01/12/2021   Hemoglobin A1C  05/16/2021   Pneumonia Vaccine  Completed   Flu Shot  Completed   Hepatitis C Screening: USPSTF Recommendation to screen - Ages 18-79 yo.  Completed   HPV Vaccine  Aged Out

## 2020-12-07 NOTE — Patient Instructions (Addendum)
[name] , Thank you for taking time to come for your Medicare Wellness Visit. I appreciate your ongoing commitment to your health goals. Please review the following plan we discussed and let me know if I can assist you in the future.   These are the goals we discussed:  Goals      Monitor and Manage My Blood Sugar-Diabetes Type 2     Timeframe:  Long-Range Goal Priority:  High Start Date:           06/15/2020                  Expected End Date:     12/15/2020                  Follow Up Date- 12/19/20   - you are currently not checking blood sugar- please be mindful of how you feel each day - low blood sugar symptoms- shaky, sweaty, nervous, dizzy- eat or drink if any of these symptoms - Try to limit or stop drinking sugary drinks - Be mindful of carbohydrate intake at each meal- too much rice, pasta, bread, starchy food will increase your blood sugar - Limit fast food and salt - avoid/ limit concentrated sweets such as cookies, pie, cake, etc. - continue to take metformin as prescribed - try to exercise/ walk some everyday- get outside daily - follow up with Dr. Moshe Cipro on 11/10, Dr. Halford Chessman 10/28,  Dr. Diona Fanti 11/1   Why is this important?   Checking your blood sugar at home helps to keep it from getting very high or very low.  Writing the results in a diary or log helps the doctor know how to care for you.  Your blood sugar log should have the time, date and the results.  Also, write down the amount of insulin or other medicine that you take.  Other information, like what you ate, exercise done and how you were feeling, will also be helpful.     Notes:      Track and Manage My Blood Pressure-Hypertension     Timeframe:  Long-Range Goal Priority:  High Start Date:     02/24/2020                        Expected End Date:      08/23/2020                 Follow Up Date 08/17/2020   Your blood pressure continues to be elevated Choose a place to take my blood pressure (home,  clinic or office, retail store)  Take medications as prescribed and keep refilled on time Avoid/limit salty foods such as chips, fast food, canned soups, etc. Continue to ask your friend to look at food labels for you to see salt content Choose fresh or frozen foods as much as you can Follow up with primary doctor 08/28/20 Call RN care manager if you have any questions at [phone]   Why is this important?   You won't feel high blood pressure, but it can still hurt your blood vessels.  High blood pressure can cause heart or kidney problems. It can also cause a stroke.  Making lifestyle changes like losing a little weight or eating less salt will help.  Checking your blood pressure at home and at different times of the day can help to control blood pressure.  If the doctor prescribes medicine remember to take it the way the doctor  ordered.  Call the office if you cannot afford the medicine or if there are questions about it.     Notes:      Track and Manage My Symptoms-COPD     Timeframe:  Long-Range Goal Priority:  High Start Date:      02/24/2020                       Expected End Date:   03/23/2021                 Follow Up Date 12/19/2020 Continue to ask friends for help as needed- practice energy conservation Alternate activity with rest as this keeps you from getting too tired Continue walking inside stores follow COPD rescue/ action plan -be mindful of how you are feeling each day call your doctor for any change in symptoms such as coughing more than usual, inhalers are not helping or overall not feeling well Follow up with Dr. Halford Chessman (lung doctor) 10/28 and Dr. Moshe Cipro on 11/10 Practice deep breathing exercises daily Wear a mask as needed and wash your hands Use oxygen as needed and only as prescribed- do not increase 02 rate unless you discuss with your doctor first Be careful of oxygen tubing so that you do not trip      Why is this important?   Tracking your symptoms  and other information about your health helps your doctor plan your care.  Write down the symptoms, the time of day, what you were doing and what medicine you are taking.  You will soon learn how to manage your symptoms.     Notes:         This is a list of the screening recommended for you and due dates:  Health Maintenance  Topic Date Due   Tetanus Vaccine  09/21/2013   Colon Cancer Screening  07/14/2016   Zoster (Shingles) Vaccine (2 of 2) 01/15/2019   COVID-19 Vaccine (6 - Booster for Moderna series) 01/12/2021   Hemoglobin A1C  05/16/2021   Pneumonia Vaccine  Completed   Flu Shot  Completed   Hepatitis C Screening: USPSTF Recommendation to screen - Ages 18-79 yo.  Completed   HPV Vaccine  Aged Out      Health Maintenance, Male Adopting a healthy lifestyle and getting preventive care are important in promoting health and wellness. Ask your health care provider about: The right schedule for you to have regular tests and exams. Things you can do on your own to prevent diseases and keep yourself healthy. What should I know about diet, weight, and exercise? Eat a healthy diet  Eat a diet that includes plenty of vegetables, fruits, low-fat dairy products, and lean protein. Do not eat a lot of foods that are high in solid fats, added sugars, or sodium. Maintain a healthy weight Body mass index (BMI) is a measurement that can be used to identify possible weight problems. It estimates body fat based on height and weight. Your health care provider can help determine your BMI and help you achieve or maintain a healthy weight. Get regular exercise Get regular exercise. This is one of the most important things you can do for your health. Most adults should: Exercise for at least 150 minutes each week. The exercise should increase your heart rate and make you sweat (moderate-intensity exercise). Do strengthening exercises at least twice a week. This is in addition to the  moderate-intensity exercise. Spend less time sitting. Even light physical activity  can be beneficial. Watch cholesterol and blood lipids Have your blood tested for lipids and cholesterol at 72 years of age, then have this test every 5 years. You may need to have your cholesterol levels checked more often if: Your lipid or cholesterol levels are high. You are older than 72 years of age. You are at high risk for heart disease. What should I know about cancer screening? Many types of cancers can be detected early and may often be prevented. Depending on your health history and family history, you may need to have cancer screening at various ages. This may include screening for: Colorectal cancer. Prostate cancer. Skin cancer. Lung cancer. What should I know about heart disease, diabetes, and high blood pressure? Blood pressure and heart disease High blood pressure causes heart disease and increases the risk of stroke. This is more likely to develop in people who have high blood pressure readings or are overweight. Talk with your health care provider about your target blood pressure readings. Have your blood pressure checked: Every 3-5 years if you are 68-40 years of age. Every year if you are 1 years old or older. If you are between the ages of 41 and 83 and are a current or former smoker, ask your health care provider if you should have a one-time screening for abdominal aortic aneurysm (AAA). Diabetes Have regular diabetes screenings. This checks your fasting blood sugar level. Have the screening done: Once every three years after age 70 if you are at a normal weight and have a low risk for diabetes. More often and at a younger age if you are overweight or have a high risk for diabetes. What should I know about preventing infection? Hepatitis B If you have a higher risk for hepatitis B, you should be screened for this virus. Talk with your health care provider to find out if you are at  risk for hepatitis B infection. Hepatitis C Blood testing is recommended for: Everyone born from [birth year] through 1967. Anyone with known risk factors for hepatitis C. Sexually transmitted infections (STIs) You should be screened each year for STIs, including gonorrhea and chlamydia, if: You are sexually active and are younger than 72 years of age. You are older than 72 years of age and your health care provider tells you that you are at risk for this type of infection. Your sexual activity has changed since you were last screened, and you are at increased risk for chlamydia or gonorrhea. Ask your health care provider if you are at risk. Ask your health care provider about whether you are at high risk for HIV. Your health care provider may recommend a prescription medicine to help prevent HIV infection. If you choose to take medicine to prevent HIV, you should first get tested for HIV. You should then be tested every 3 months for as long as you are taking the medicine. Follow these instructions at home: Alcohol use Do not drink alcohol if your health care provider tells you not to drink. If you drink alcohol: Limit how much you have to 0-2 drinks a day. Know how much alcohol is in your drink. In the [place] one drink equals one 12 oz bottle of beer (355 mL), one 5 oz glass of wine (148 mL), or one 1 oz glass of hard liquor (44 mL). Lifestyle Do not use any products that contain nicotine or tobacco. These products include cigarettes, chewing tobacco, and vaping devices, such as e-cigarettes. If you need help quitting, ask  your health care provider. Do not use street drugs. Do not share needles. Ask your health care provider for help if you need support or information about quitting drugs. General instructions Schedule regular health, dental, and eye exams. Stay current with your vaccines. Tell your health care provider if: You often feel depressed. You have ever been abused or do not feel safe  at home. Summary Adopting a healthy lifestyle and getting preventive care are important in promoting health and wellness. Follow your health care provider's instructions about healthy diet, exercising, and getting tested or screened for diseases. Follow your health care provider's instructions on monitoring your cholesterol and blood pressure. This information is not intended to replace advice given to you by your health care provider. Make sure you discuss any questions you have with your health care provider. Document Revised: 05/15/2020 Document Reviewed: 05/15/2020 Elsevier Patient Education  [place].

## 2020-12-13 ENCOUNTER — Other Ambulatory Visit: Payer: Self-pay | Admitting: Family Medicine

## 2020-12-13 DIAGNOSIS — J449 Chronic obstructive pulmonary disease, unspecified: Secondary | ICD-10-CM

## 2020-12-19 ENCOUNTER — Other Ambulatory Visit: Payer: Self-pay | Admitting: Urology

## 2020-12-19 ENCOUNTER — Telehealth: Payer: Self-pay | Admitting: *Deleted

## 2020-12-19 ENCOUNTER — Telehealth: Payer: Medicare HMO

## 2020-12-19 DIAGNOSIS — C61 Malignant neoplasm of prostate: Secondary | ICD-10-CM

## 2020-12-19 NOTE — Telephone Encounter (Signed)
°  Care Management   Follow Up Note   12/19/2020 Name: [name] [name]: [id number] DOB: 14-Feb-1950   Referred by: Fayrene Helper, MD Reason for referral : Chronic Care Management (HTN, DM2, COPD)   An unsuccessful telephone outreach was attempted today. The patient was referred to the case management team for assistance with care management and care coordination.   Follow Up Plan: Telephone follow up appointment with care management team member scheduled for: upon care guide rescheduling.  Jacqlyn Larsen [facility], BSN RN Case Manager [facility] [phone]

## 2020-12-28 ENCOUNTER — Ambulatory Visit (INDEPENDENT_AMBULATORY_CARE_PROVIDER_SITE_OTHER): Payer: Medicare HMO | Admitting: *Deleted

## 2020-12-28 DIAGNOSIS — J449 Chronic obstructive pulmonary disease, unspecified: Secondary | ICD-10-CM

## 2020-12-28 DIAGNOSIS — E119 Type 2 diabetes mellitus without complications: Secondary | ICD-10-CM

## 2020-12-28 NOTE — Chronic Care Management (AMB) (Signed)
Chronic Care Management   CCM RN Visit Note  12/28/2020 Name: [name] [name]: [id number] DOB: 1950-12-14  Subjective: [name] is a 70 y.o. year old male who is a primary care patient of [name], MD. The care management team was consulted for assistance with disease management and care coordination needs.    Engaged with patient by telephone for follow up visit in response to provider referral for case management and/or care coordination services.   Consent to Services:  The patient was given information about Chronic Care Management services, agreed to services, and gave verbal consent prior to initiation of services.  Please see initial visit note for detailed documentation.   Patient agreed to services and verbal consent obtained.   Assessment: Review of patient past medical history, allergies, medications, health status, including review of consultants reports, laboratory and other test data, was performed as part of comprehensive evaluation and provision of chronic care management services.   SDOH (Social Determinants of Health) assessments and interventions performed:    CCM Care Plan  No Known Allergies  Outpatient Encounter Medications as of 12/28/2020  Medication Sig   albuterol (PROVENTIL) (2.5 MG/3ML) 0.083% nebulizer solution INHALE THE CONTENTS OF 1 VIAL VIA NEBULIZER EVERY 6 (SIX) HOURS AS NEEDED FOR WHEEZING OR SHORTNESS OF BREATH.   albuterol (VENTOLIN HFA) 108 (90 Base) MCG/ACT inhaler INHALE 2 PUFFS INTO THE LUNGS EVERY FOUR HOURS AS NEEDED. ONLY IF YOU CAN'T CATCH YOUR BREATH.   amLODipine (NORVASC) 5 MG tablet Take 1 tablet (5 mg total) by mouth daily.   aspirin EC 81 MG tablet Take 81 mg by mouth daily. Swallow whole.   BREZTRI AEROSPHERE 160-9-4.8 MCG/ACT AERO INHALE 2 PUFFS FIRST THING IN THE MORNING AND THEN ANOTHER 2 PUFFS ABOUT 12 HOURS LATER.   fluticasone (FLONASE) 50 MCG/ACT nasal spray Place 1 spray into both nostrils daily.    metFORMIN (GLUCOPHAGE) 500 MG tablet TAKE 1 TABLET TWICE DAILY WITH MEALS   montelukast (SINGULAIR) 10 MG tablet Take 1 tablet (10 mg total) by mouth at bedtime.   pravastatin (PRAVACHOL) 40 MG tablet TAKE 1 TABLET AT BEDTIME   predniSONE (DELTASONE) 5 MG tablet Take 1 tablet (5 mg total) by mouth daily with breakfast.   triamterene-hydrochlorothiazide (MAXZIDE-25) 37.5-25 MG tablet TAKE 1 AND 1/2 TABLETS EVERY DAY   blood glucose meter kit and supplies Dispense based on patient and insurance preference. Once daily testing DX E11.9 (Patient not taking: Reported on 12/28/2020)   glucose blood test strip Use as instructed once daily dx e11.9 (Patient not taking: Reported on 12/28/2020)   Lancets 30G MISC Once daily testing dx e11.9 (Patient not taking: Reported on 12/28/2020)   [DISCONTINUED] simvastatin (ZOCOR) 40 MG tablet Take 40 mg by mouth daily.   No facility-administered encounter medications on file as of 12/28/2020.    Patient Active Problem List   Diagnosis Date Noted   Rhinitis, chronic 06/10/2019   Pulmonary infiltrates on CXR 05/29/2019   COPD GOLD IV/ group D  05/11/2019   Low oxygen saturation 03/23/2019   Food insecurity 11/22/2018   DOE (dyspnea on exertion) 04/18/2018   Social isolation 03/17/2013   Asthma with COPD (chronic obstructive pulmonary disease) ([place]) 11/12/2012   Tachycardia 11/05/2010   DM (diabetes mellitus) ([place]) 10/26/2009   Vitamin D deficiency 06/28/2009   PSA, INCREASED 01/25/2009   Hyperlipidemia 07/30/2007   Essential hypertension 07/30/2007    Conditions to be addressed/monitored:COPD and DMII  Care Plan : RN Care Manager Plan  of Care  Updates made by Kassie Mends, RN since 12/28/2020 12:00 AM     Problem: No plan of care established for management of chronic disease states  (COPD, DM2, HTN)   Priority: High     Long-Range Goal: Development of plan of care for chronic disease management (COPD, DM2, HTN)   Start Date: 12/28/2020   Expected End Date: 06/26/2021  Priority: High  Note:   Current Barriers:  Knowledge Deficits related to plan of care for management of HTN, COPD, and DMII  Knowledge deficits related to basic understanding of COPD disease process and importance of oxygen management and safety, pt reports he received flu vaccine, covid booster and shingles vaccine. Knowledge deficits related to basic COPD self care/management- continues to need education/ reinforcement for COPD action plan and importance of calling doctor for symptom management Literacy barriers- patient cannot read and has friend that reads his mail etc for him Transportation barriers- patient rides Moped to appointments, shopping, etc.- has friend who provides transportation at times, pt is aware of [employer] transportation service. Limited Social Support- adult children live out of state, patient is widowed, has friend Audiological scientist) that helps him Microbiologist - patient does not go to church or other groups (due respiratory issues) Knowledge Deficits related to basic understanding of hypertension pathophysiology and self care management- patient does not check blood pressure at home, will continue to have checked at doctor's office or retail store Knowledge Deficits related to basic Diabetes pathophysiology and self care/management- pt takes metformin with elevated CBG most likely form prednisone usage (per notes) Does not use cbg meter - patient reports he is currently not checking CBG, recently got a glucometer and states has been shown how to use but still has difficulty, reports he may take glucometer to next doctor's visit or get his friend to assist him, will call RN care manager if any questions/ concerns Does not adhere to provider recommendations re: ADA diet, pt eats what he wants and continues to drink sugary soft drinks, eats out alot Does not contact provider office for questions/concerns- patient does not always stay in touch with  doctor for problems/ issues that may arise  RNCM Clinical Goal(s):  Patient will verbalize understanding of plan for management of COPD and DMII as evidenced by patient report, review of EHR and  through collaboration with RN Care manager, provider, and care team.   Interventions: 1:1 collaboration with primary care provider regarding development and update of comprehensive plan of care as evidenced by provider attestation and co-signature Inter-disciplinary care team collaboration (see longitudinal plan of care) Evaluation of current treatment plan related to  self management and patient's adherence to plan as established by provider   COPD Interventions:  (Status:  Goal on track:  Yes.) Long Term Goal Advised patient to track and manage COPD triggers Advised patient to self assesses COPD action plan zone and make appointment with provider if in the yellow zone for 48 hours without improvement Discussed the importance of adequate rest and management of fatigue with COPD Pain assessment completed Reviewed infection prevention, importance of good handwashing, wearing a mask as needed  Diabetes Interventions:  (Status:  Goal on track:  Yes.) Long Term Goal Assessed patient's understanding of A1c goal: <7% Reviewed medications with patient and discussed importance of medication adherence Counseled on importance of regular laboratory monitoring as prescribed Discussed plans with patient for ongoing care management follow up and provided patient with direct contact information for care management team Review  of patient status, including review of consultants reports, relevant laboratory and other test results, and medications completed Reviewed carbohydrate modified diet Reinforced taking glucometer to primary care provider office if needed for instruction on using glucometer and asking friend for help Lab Results  Component Value Date   HGBA1C 6.2 (H) 11/16/2020    Patient Goals/Self-Care  Activities: Take medications as prescribed   Call pharmacy for medication refills 3-7 days in advance of running out of medications Perform all self care activities independently  Perform IADL's (shopping, preparing meals, housekeeping, managing finances) independently Call provider office for new concerns or questions  check feet daily for cuts, sores or redness fill half of plate with vegetables limit fast food meals to no more than 1 per week manage portion size - identify and remove indoor air pollutants - limit outdoor activity during cold weather - follow rescue plan if symptoms flare-up - get at least 7 to 8 hours of sleep at night Take glucometer to your doctor's office for help with learning how to use Ask your friend for assistance if needed Wash your hands good and wear a mask if needed, avoid sick people Follow low salt diet       Plan:Telephone follow up appointment with care management team member scheduled for:  02/20/2021  Jacqlyn Larsen [facility], BSN RN Case Manager [facility] [phone]

## 2020-12-28 NOTE — Patient Instructions (Signed)
Visit Information  Thank you for taking time to visit with me today. Please don't hesitate to contact me if I can be of assistance to you before our next scheduled telephone appointment.  Following are the goals we discussed today:  Take medications as prescribed   Call pharmacy for medication refills 3-7 days in advance of running out of medications Perform all self care activities independently  Perform IADL's (shopping, preparing meals, housekeeping, managing finances) independently Call provider office for new concerns or questions  check feet daily for cuts, sores or redness fill half of plate with vegetables limit fast food meals to no more than 1 per week manage portion size identify and remove indoor air pollutants limit outdoor activity during cold weather follow rescue plan if symptoms flare-up get at least 7 to 8 hours of sleep at night Take glucometer to your doctor's office for help with learning how to use Ask your friend for assistance if needed Wash your hands good and wear a mask if needed, avoid sick people Follow low salt diet  Our next appointment is by telephone on 02/20/2021 at 9 am  Please call the care guide team at [phone] if you need to cancel or reschedule your appointment.   If you are experiencing a Mental Health or [place] or need someone to talk to, please call the [place] National Suicide Prevention Lifeline: (248)609-9776 or TTY: 343-206-8272 TTY 702-674-1766) to talk to a trained counselor call 1-800-273-TALK (toll free, 24 hour hotline) go to [facility] [address], [place] [phone]) call the [place]: [phone] call 911   The patient verbalized understanding of instructions, educational materials, and care plan provided today and declined offer to receive copy of patient instructions, educational materials, and care plan.   Jacqlyn Larsen [facility], BSN RN Case  Manager [facility] [phone]

## 2020-12-30 ENCOUNTER — Other Ambulatory Visit: Payer: Self-pay | Admitting: Pulmonary Disease

## 2021-01-02 ENCOUNTER — Other Ambulatory Visit: Payer: Self-pay

## 2021-01-02 MED ORDER — BREZTRI AEROSPHERE 160-9-4.8 MCG/ACT IN AERO: 2.0000 | INHALATION_SPRAY | Freq: Two times a day (BID) | RESPIRATORY_TRACT | 0 refills | Status: DC

## 2021-01-06 DIAGNOSIS — J449 Chronic obstructive pulmonary disease, unspecified: Secondary | ICD-10-CM

## 2021-01-06 DIAGNOSIS — E1169 Type 2 diabetes mellitus with other specified complication: Secondary | ICD-10-CM

## 2021-01-11 ENCOUNTER — Telehealth: Payer: Self-pay | Admitting: Pulmonary Disease

## 2021-01-11 ENCOUNTER — Other Ambulatory Visit: Payer: Self-pay

## 2021-01-11 MED ORDER — BREZTRI AEROSPHERE 160-9-4.8 MCG/ACT IN AERO: INHALATION_SPRAY | RESPIRATORY_TRACT | 11 refills | Status: DC

## 2021-01-11 NOTE — Telephone Encounter (Signed)
Refill sent   Patient notified

## 2021-01-11 NOTE — Telephone Encounter (Signed)
Refill sent to [facility]. Called and notified patient.

## 2021-01-16 ENCOUNTER — Telehealth: Payer: Self-pay

## 2021-01-16 NOTE — Telephone Encounter (Signed)
Inez Catalina ( Friend of patient called) to inquire about referral to Dr. Alfredo Bach office as they have not heard from them yet.  Message sent to [facility] to inquire

## 2021-01-17 NOTE — Telephone Encounter (Signed)
I called and left a voicemail for [name] to call me back

## 2021-01-17 NOTE — Telephone Encounter (Signed)
Are you able to call this lady and check on referral for this patient? Thanks

## 2021-01-17 NOTE — Telephone Encounter (Signed)
I contacted Shay from Dr. Alfredo Bach office and she advised they never received a referral. Estill Bamberg and myself both confirmed in our system it was created 11/2020. Her contact number is [phone].

## 2021-01-26 ENCOUNTER — Other Ambulatory Visit: Payer: Self-pay | Admitting: Family Medicine

## 2021-02-07 ENCOUNTER — Other Ambulatory Visit: Payer: Self-pay | Admitting: Pulmonary Disease

## 2021-02-09 ENCOUNTER — Ambulatory Visit (INDEPENDENT_AMBULATORY_CARE_PROVIDER_SITE_OTHER): Payer: Medicare HMO | Admitting: Family Medicine

## 2021-02-09 ENCOUNTER — Other Ambulatory Visit: Payer: Self-pay

## 2021-02-09 VITALS — BP 130/80 | HR 100 | Resp 18 | Ht 67.0 in | Wt 128.1 lb

## 2021-02-09 DIAGNOSIS — E119 Type 2 diabetes mellitus without complications: Secondary | ICD-10-CM

## 2021-02-09 DIAGNOSIS — Z0000 Encounter for general adult medical examination without abnormal findings: Secondary | ICD-10-CM

## 2021-02-09 DIAGNOSIS — E785 Hyperlipidemia, unspecified: Secondary | ICD-10-CM

## 2021-02-09 DIAGNOSIS — I10 Essential (primary) hypertension: Secondary | ICD-10-CM

## 2021-02-09 NOTE — Assessment & Plan Note (Signed)

## 2021-02-09 NOTE — Patient Instructions (Addendum)
F/U in 4 months, call if you need me sooner  Fasting lipid, cmp and eGFR and hBA1c 5 days before next visit  Thanks for choosing [facility], we consider it a privelige to serve you.

## 2021-02-11 ENCOUNTER — Encounter: Payer: Self-pay | Admitting: Family Medicine

## 2021-02-11 NOTE — Progress Notes (Signed)
° °  [name]     [name]: [id number]      DOB: [birth year]   HPI: Patient is in for annual physical exam. No other health concerns are expressed or addressed at the visit. Recent labs, if available are reviewed. Immunization is reviewed , and  updated if needed.    PE; BP 130/80    Pulse 100    Resp 18    Ht 5\' 7"  (1.702 m)    Wt 128 lb 1.9 oz (58.1 kg)    SpO2 91%    BMI 20.07 kg/m    Pleasant male, alert and oriented x 3, in no cardio-pulmonary distress. Afebrile. HEENT No facial trauma or asymetry. Sinuses non tender. EOMI External ears normal,  Neck: supple, no adenopathy,JVD or thyromegaly.No bruits.  Chest: Clear to ascultation bilaterally.No crackles or wheezes.Decreased air entry throughout Non tender to palpation  Cardiovascular system; Heart sounds normal,  S1 and  S2 ,no S3.  No murmur, or thrill. Apical beat not displaced Peripheral pulses normal.  Abdomen: Soft, non tender, no organomegaly or masses. No bruits. Bowel sounds normal. No guarding, tenderness or rebound.    Musculoskeletal exam: Full ROM of spine, hips , shoulders and knees. No deformity ,swelling or crepitus noted. No muscle wasting or atrophy.   Neurologic: Cranial nerves 2 to 12 intact. Power, tone ,sensation and reflexes normal throughout. No disturbance in gait. No tremor.  Skin: Intact, no ulceration, erythema , scaling or rash noted. Pigmentation normal throughout  Psych; Normal mood and affect. Judgement and concentration normal   Assessment & Plan:  Annual physical exam Annual exam as documented. Counseling done  re healthy lifestyle involving commitment to 150 minutes exercise per week, heart healthy diet, and attaining healthy weight.The importance of adequate sleep also discussed. Regular seat belt use and home safety, is also discussed. Changes in health habits are decided on by the patient with goals and time frames  set for achieving them. Immunization and cancer  screening needs are specifically addressed at this visit.

## 2021-02-12 NOTE — Progress Notes (Signed)
GU Location of Tumor / Histology: Prostate Ca  If Prostate Cancer, PSA is (9.5 as of 09/2020)  Biopsies  Dr. Diona Fanti  9.14.2022: TRUS/Bx. PSA 9.5, prostate volume 33.6 mL, PSAD 0.28. 1/12 cores (RT base lateral) revealed GG 2 pattern in 40 % of core.  PSA levels are as follows: 4.1.2014-6.19 3.15.2016--8.75 11.13.2019--7.9 8.8.2022--9.5  Past/Anticipated interventions by urology, if any:   Past/Anticipated interventions by medical oncology, if any:   Weight changes, if any:  No  IPSS:  1 SHIM:  NA per patient   Bowel/Bladder complaints, if any:  No bowel or bladder issues.  Nausea/Vomiting, if any: No  Pain issues, if any:  0/10  SAFETY ISSUES: Prior radiation?  No Pacemaker/ICD?  No Possible current pregnancy?  Male Is the patient on methotrexate? No  Current Complaints / other details:  Need more information on treatment options.

## 2021-02-13 ENCOUNTER — Ambulatory Visit
Admission: RE | Admit: 2021-02-13 | Discharge: 2021-02-13 | Disposition: A | Discharge status: Home / Self Care | Payer: Medicare HMO | Source: Ambulatory Visit | Attending: Radiation Oncology | Admitting: Radiation Oncology

## 2021-02-13 ENCOUNTER — Other Ambulatory Visit: Payer: Self-pay

## 2021-02-13 VITALS — BP 150/75 | HR 80 | Temp 97.8°F | Resp 20 | Ht 67.0 in | Wt 125.6 lb

## 2021-02-13 DIAGNOSIS — C61 Malignant neoplasm of prostate: Secondary | ICD-10-CM | POA: Diagnosis not present

## 2021-02-13 DIAGNOSIS — Z87891 Personal history of nicotine dependence: Secondary | ICD-10-CM | POA: Insufficient documentation

## 2021-02-13 DIAGNOSIS — E785 Hyperlipidemia, unspecified: Secondary | ICD-10-CM | POA: Diagnosis not present

## 2021-02-13 DIAGNOSIS — Z8673 Personal history of transient ischemic attack (TIA), and cerebral infarction without residual deficits: Secondary | ICD-10-CM | POA: Insufficient documentation

## 2021-02-13 DIAGNOSIS — Z7982 Long term (current) use of aspirin: Secondary | ICD-10-CM | POA: Diagnosis not present

## 2021-02-13 DIAGNOSIS — Z79899 Other long term (current) drug therapy: Secondary | ICD-10-CM | POA: Insufficient documentation

## 2021-02-13 DIAGNOSIS — Z7984 Long term (current) use of oral hypoglycemic drugs: Secondary | ICD-10-CM | POA: Insufficient documentation

## 2021-02-13 DIAGNOSIS — J449 Chronic obstructive pulmonary disease, unspecified: Secondary | ICD-10-CM | POA: Diagnosis not present

## 2021-02-13 DIAGNOSIS — I10 Essential (primary) hypertension: Secondary | ICD-10-CM | POA: Insufficient documentation

## 2021-02-13 NOTE — Progress Notes (Signed)
Radiation Oncology         [phone] ________________________________  Initial Outpatient Consultation  Name: [name] [name]: [id number]  Date: 02/13/2021  DOB: 1950-05-13  [id number], Norwood Levo, MD  [name], MD   REFERRING PHYSICIAN: [name], MD  DIAGNOSIS: 71 y.o. gentleman with Stage T1c adenocarcinoma of the prostate with Gleason score of 3+4, and PSA of 9.5.    ICD-10-CM   1. Malignant neoplasm of prostate ([place])  C61       HISTORY OF PRESENT ILLNESS: [name] is a 71 y.o. male with a diagnosis of prostate cancer. He has a history of an elevated PSA dating back to at least 04/2012, at which time it was 6.19. He was previously seen by another urologist in [place] and thinks he may have had a previous prostate biopsy that was negative. More recently, he was noted to have an elevated PSA of 9.5 by his primary care physician, Dr. Moshe Cipro.  Accordingly, he was referred for evaluation in urology by Dr. Diona Fanti on 09/05/20,  digital rectal examination was performed at that time revealing slight firmness bilaterally.  The patient proceeded to transrectal ultrasound with 12 biopsies of the prostate on 09/19/20.  The prostate volume measured 33 cc.  Out of 12 core biopsies, only one was positive.  The maximum Gleason score was 3+4, and this was seen in the right base lateral.  The patient reviewed the biopsy results with his urologist and he has kindly been referred today for discussion of potential radiation treatment options.  PREVIOUS RADIATION THERAPY: No  PAST MEDICAL HISTORY:  Past Medical History:  Diagnosis Date   Asthma    CAP (community acquired pneumonia) 12/10/2018   COPD (chronic obstructive pulmonary disease) ([place])    COPD with acute exacerbation ([place]) 04/18/2018   CVA (cerebral vascular accident) ([place]) 2008   with temporary vision loss    Depression    ECHOCARDIOGRAM, ABNORMAL 03/06/2010   Qualifier: Diagnosis of  By: Purcell Nails, NP,  Kathryn     Elevated PSA 2014   no diagnosis pf prostate cancer in 07/2014   History of substance abuse ([place]) 01/14/2011   Marijuana   History of substance abuse ([place]) 01/14/2011   History of tobacco abuse 01/14/2011   Hyperlipidemia    Hypertension    Lobar pneumonia ([place]) 12/13/2018   Marijuana abuse    Nicotine addiction    Prediabetes 2014      PAST SURGICAL HISTORY:No past surgical history on file.  FAMILY HISTORY: No family history on file.  SOCIAL HISTORY:  Social History   Socioeconomic History   Marital status: Widowed    Spouse name: Not on file   Number of children: 1   Years of education: 32 the grade   Highest education level: 11th grade  Occupational History   Occupation: unemployed, retired  Tobacco Use   Smoking status: Former    Packs/day: 0.50    Years: 40.00    Pack years: 20.00    Types: Cigarettes    Quit date: 04/14/2015    Years since quitting: 5.8   Smokeless tobacco: Never  Vaping Use   Vaping Use: Never used  Substance and Sexual Activity   Alcohol use: Not Currently    Alcohol/week: 3.0 standard drinks    Types: 3 Cans of beer per week    Comment: occasionally   Drug use: No    Comment: pt quit 20 years ago   Sexual activity: Not Currently  Other Topics  Concern   Not on file  Social History Narrative   Not on file   Social Determinants of Health   Financial Resource Strain: Low Risk    Difficulty of Paying Living Expenses: Not very hard  Food Insecurity: No Food Insecurity   Worried About Running Out of Food in the Last Year: Never true   Ran Out of Food in the Last Year: Never true  Transportation Needs: No Transportation Needs   Lack of Transportation (Medical): No   Lack of Transportation (Non-Medical): No  Physical Activity: Inactive   Days of Exercise per Week: 0 days   Minutes of Exercise per Session: 0 min  Stress: No Stress Concern Present   Feeling of Stress : Only a little  Social Connections: Socially Isolated    Frequency of Communication with Friends and Family: Once a week   Frequency of Social Gatherings with Friends and Family: Never   Attends Religious Services: Never   Marine scientist or Organizations: No   Attends Archivist Meetings: Never   Marital Status: Widowed  Human resources officer Violence: Not At Risk   Fear of Current or Ex-Partner: No   Emotionally Abused: No   Physically Abused: No   Sexually Abused: No    ALLERGIES: Patient has no known allergies.  MEDICATIONS:  Current Outpatient Medications  Medication Sig Dispense Refill   albuterol (PROVENTIL) (2.5 MG/3ML) 0.083% nebulizer solution INHALE THE CONTENTS OF 1 VIAL VIA NEBULIZER EVERY 6 (SIX) HOURS AS NEEDED FOR WHEEZING OR SHORTNESS OF BREATH. 150 mL 0   albuterol (VENTOLIN HFA) 108 (90 Base) MCG/ACT inhaler INHALE 2 PUFFS INTO THE LUNGS EVERY FOUR HOURS AS NEEDED. ONLY IF YOU CAN'T CATCH YOUR BREATH. 8.5 g 5   amLODipine (NORVASC) 5 MG tablet Take 1 tablet (5 mg total) by mouth daily. 90 tablet 1   aspirin EC 81 MG tablet Take 81 mg by mouth daily. Swallow whole.     blood glucose meter kit and supplies Dispense based on patient and insurance preference. Once daily testing DX E11.9 1 each 0   Budeson-Glycopyrrol-Formoterol (BREZTRI AEROSPHERE) 160-9-4.8 MCG/ACT AERO INHALE (2) PUFFS INTO THE LUNGS IN THE MORNING AND AT BEDTIME 10.7 g 11   fluticasone (FLONASE) 50 MCG/ACT nasal spray Place 1 spray into both nostrils daily.     glucose blood test strip Use as instructed once daily dx e11.9 50 each 5   Lancets 30G MISC Once daily testing dx e11.9 50 each 5   metFORMIN (GLUCOPHAGE) 500 MG tablet TAKE 1 TABLET TWICE DAILY WITH MEALS 180 tablet 1   MODERNA COVID-19 BIVAL BOOSTER 50 MCG/0.5ML injection      pravastatin (PRAVACHOL) 40 MG tablet TAKE 1 TABLET AT BEDTIME 90 tablet 1   predniSONE (DELTASONE) 5 MG tablet TAKE 1 TABLET (5 MG TOTAL) BY MOUTH DAILY WITH BREAKFAST. 90 tablet 2    triamterene-hydrochlorothiazide (MAXZIDE-25) 37.5-25 MG tablet TAKE 1 AND 1/2 TABLETS EVERY DAY 135 tablet 1   No current facility-administered medications for this encounter.    REVIEW OF SYSTEMS:  On review of systems, the patient reports that he is doing well overall. He denies any chest pain, shortness of breath, cough, fevers, chills, night sweats, unintended weight changes. He denies any bowel disturbances, and denies abdominal pain, nausea or vomiting. He denies any new musculoskeletal or joint aches or pains. His IPSS was 1, indicating minimal urinary symptoms. He denies being sexually active at present. A complete review of systems is obtained and is  otherwise negative.  PHYSICAL EXAM:  Wt Readings from Last 3 Encounters:  02/13/21 125 lb 9.6 oz (57 kg)  02/09/21 128 lb 1.9 oz (58.1 kg)  11/16/20 126 lb (57.2 kg)   Temp Readings from Last 3 Encounters:  02/13/21 97.8 F (36.6 C)  11/03/20 98.4 F (36.9 C) (Oral)  09/19/20 98.4 F (36.9 C) (Oral)   BP Readings from Last 3 Encounters:  02/13/21 (!) 150/75  02/09/21 130/80  11/18/20 130/84   Pulse Readings from Last 3 Encounters:  02/13/21 80  02/09/21 100  11/16/20 96   Pain Assessment Pain Score: 0-No pain/10  In general this is a well appearing African American male in no acute distress. He's alert and oriented x4 and appropriate throughout the examination. Cardiopulmonary assessment is negative for acute distress, and he exhibits normal effort.     KPS = 90  100 - Normal; no complaints; no evidence of disease. 90   - Able to carry on normal activity; minor signs or symptoms of disease. 80   - Normal activity with effort; some signs or symptoms of disease. 7   - Cares for self; unable to carry on normal activity or to do active work. 60   - Requires occasional assistance, but is able to care for most of his personal needs. 50   - Requires considerable assistance and frequent medical care. 63   - Disabled;  requires special care and assistance. 35   - Severely disabled; hospital admission is indicated although death not imminent. 63   - Very sick; hospital admission necessary; active supportive treatment necessary. 10   - Moribund; fatal processes progressing rapidly. 0     - Dead  Karnofsky DA, Abelmann [place], Craver LS and Burchenal [facility] [phone]) The use of the nitrogen mustards in the palliative treatment of carcinoma: with particular reference to bronchogenic carcinoma Cancer 1 634-56  LABORATORY DATA:  Lab Results  Component Value Date   WBC 8.9 08/14/2020   HGB 13.1 08/14/2020   HCT 39.1 08/14/2020   MCV 91 08/14/2020   PLT 207 08/14/2020   Lab Results  Component Value Date   NA 139 11/16/2020   K 3.7 11/16/2020   CL 98 11/16/2020   CO2 29 11/16/2020   Lab Results  Component Value Date   ALT 15 08/14/2020   AST 12 08/14/2020   ALKPHOS 74 08/14/2020   BILITOT 0.5 08/14/2020     RADIOGRAPHY: No results found.    IMPRESSION/PLAN: 1. 71 y.o. gentleman with Stage T1c adenocarcinoma of the prostate with Gleason Score of 3+4, and PSA of 9.5. We discussed the patient's workup and outlined the nature of prostate cancer in this setting. The patient's T stage, Gleason's score, and PSA put him into the favorable intermediate risk group. Accordingly, he is eligible for a variety of potential treatment options including brachytherapy, 5.5 weeks of external radiation, or prostatectomy.  In light of his age and medical comorbidities, he is not felt to be an ideal surgical candidate.  We discussed the available radiation techniques, and focused on the details and logistics of delivery. We discussed and outlined the risks, benefits, short and long-term effects associated with radiotherapy and compared and contrasted these with prostatectomy. We discussed the role of SpaceOAR gel in reducing the rectal toxicity associated with radiotherapy. He appears to have a good understanding of his disease and our  treatment recommendations which are of curative intent.  He was encouraged to ask questions that were answered to his stated  satisfaction.  At the conclusion of our conversation, the patient is interested in moving forward with brachytherapy and use of SpaceOAR gel to reduce rectal toxicity from radiotherapy.  We will share our discussion with Dr. Diona Fanti and move forward with scheduling his CT [facility] planning appointment in the near future.  The patient met briefly with Romie Jumper in our office who will be working closely with him to coordinate OR scheduling and pre and post procedure appointments.  We will contact the pharmaceutical rep to ensure that University is available at the time of procedure.  We enjoyed meeting him today and look forward to continuing to participate in his care.   He does have a history of COPD, and may need to be screened by anesthesia prior to seed implant.  We personally spent 70 minutes in this encounter including chart review, reviewing radiological studies, meeting face-to-face with the patient, entering orders and completing documentation.    Nicholos Johns, PA-C    Tyler Pita, MD  [facility] Direct Dial: [phone]   Fax: [phone] [email or web address]   Skype   LinkedIn   This document serves as a record of services personally performed by Tyler Pita, MD and Freeman Caldron, PA-C. It was created on their behalf by Wilburn Mylar, a trained medical scribe. The creation of this record is based on the scribe's personal observations and the provider's statements to them. This document has been checked and approved by the attending provider.

## 2021-02-13 NOTE — Progress Notes (Signed)
Introduced myself to the patient as the prostate nurse navigator.  No barriers to care identified at this time.  He is here to discuss his radiation treatment options.  I gave him my business card and asked him to call me with questions or concerns.  Verbalized understanding.  ?

## 2021-02-19 ENCOUNTER — Other Ambulatory Visit: Payer: Self-pay | Admitting: Urology

## 2021-02-19 ENCOUNTER — Telehealth: Payer: Self-pay | Admitting: *Deleted

## 2021-02-19 NOTE — Telephone Encounter (Signed)
CALLED PATIENT TO UPDATE, SPOKE WITH PATIENT 

## 2021-02-20 ENCOUNTER — Telehealth: Payer: Medicare HMO

## 2021-02-20 ENCOUNTER — Telehealth: Payer: Self-pay | Admitting: *Deleted

## 2021-02-20 NOTE — Telephone Encounter (Signed)
Called patient to inform of implant date, lvm for a return call 

## 2021-02-27 ENCOUNTER — Telehealth: Payer: Self-pay | Admitting: *Deleted

## 2021-02-27 ENCOUNTER — Telehealth: Payer: Medicare HMO

## 2021-02-27 NOTE — Telephone Encounter (Signed)
°  Care Management   Follow Up Note   02/27/2021 Name: [name] [name]: [id number] DOB: 1950/11/04   Referred by: Fayrene Helper, MD Reason for referral : Chronic Care Management (DM2, COPD)   An unsuccessful telephone outreach was attempted today. The patient was referred to the case management team for assistance with care management and care coordination.   Follow Up Plan: Telephone follow up appointment with care management team member scheduled for: upon care guide rescheduling.  Jacqlyn Larsen [facility], BSN RN Case Manager [facility] [phone]

## 2021-02-28 ENCOUNTER — Telehealth: Payer: Self-pay | Admitting: *Deleted

## 2021-02-28 NOTE — Chronic Care Management (AMB) (Signed)
°  Care Management   Note  02/28/2021 Name: [name] [name]: [id number] DOB: 10-04-1950  [name] is a 71 y.o. year old male who is a primary care patient of Fayrene Helper, MD and is actively engaged with the care management team. I reached out to [name] by phone today to assist with re-scheduling a follow up visit with the RN Case Manager  Follow up plan: Unsuccessful telephone outreach attempt made. A HIPAA compliant phone message was left for the patient providing contact information and requesting a return call.  The care management team will reach out to the patient again over the next 7 days.  If patient returns call to provider office, please advise to call [place] at [phone].  [place] Management  Direct Dial: [phone]

## 2021-03-07 NOTE — Chronic Care Management (AMB) (Signed)
?  Care Management  ? ?Note ? ?03/07/2021 ?Name: BREYLON SHERROW [name]: [id number] DOB: Nov 15, 1950 ? ?DEAGLAN LILE is a 71 y.o. year old male who is a primary care patient of Fayrene Helper, MD and is actively engaged with the care management team. I reached out to Sharlot Gowda by phone today to assist with re-scheduling a follow up visit with the RN Case Manager ? ?Follow up plan: ?Telephone appointment with care management team member scheduled for:03/22/21 ? ?Laverda Sorenson  ?Care Guide, Embedded Care Coordination ?[place]  Care Management  ?Direct Dial: [phone] ? ?

## 2021-03-08 ENCOUNTER — Ambulatory Visit: Payer: Medicare HMO | Admitting: Pulmonary Disease

## 2021-03-08 DIAGNOSIS — E119 Type 2 diabetes mellitus without complications: Secondary | ICD-10-CM | POA: Diagnosis not present

## 2021-03-08 DIAGNOSIS — E785 Hyperlipidemia, unspecified: Secondary | ICD-10-CM | POA: Diagnosis not present

## 2021-03-08 DIAGNOSIS — I10 Essential (primary) hypertension: Secondary | ICD-10-CM | POA: Diagnosis not present

## 2021-03-09 LAB — LIPID PANEL
Chol/HDL Ratio: 2.3 ratio (ref 0.0–5.0)
Cholesterol, Total: 195 mg/dL (ref 100–199)
HDL: 84 mg/dL (ref >39)
LDL Chol Calc (NIH): 100 mg/dL — ABNORMAL HIGH (ref 0–99)
Triglycerides: 60 mg/dL (ref 0–149)
VLDL Cholesterol Cal: 11 mg/dL (ref 5–40)

## 2021-03-09 LAB — CMP14+EGFR
ALT: 15 IU/L (ref 0–44)
AST: 16 IU/L (ref 0–40)
Albumin/Globulin Ratio: 2.6 — ABNORMAL HIGH (ref 1.2–2.2)
Albumin: 4.7 g/dL (ref 3.7–4.7)
Alkaline Phosphatase: 65 IU/L (ref 44–121)
BUN/Creatinine Ratio: 9 — ABNORMAL LOW (ref 10–24)
BUN: 8 mg/dL (ref 8–27)
Bilirubin Total: 0.4 mg/dL (ref 0.0–1.2)
CO2: 27 mmol/L (ref 20–29)
Calcium: 9.9 mg/dL (ref 8.6–10.2)
Chloride: 102 mmol/L (ref 96–106)
Creatinine, Ser: 0.89 mg/dL (ref 0.76–1.27)
Globulin, Total: 1.8 g/dL (ref 1.5–4.5)
Glucose: 126 mg/dL — ABNORMAL HIGH (ref 70–99)
Potassium: 4.1 mmol/L (ref 3.5–5.2)
Sodium: 145 mmol/L — ABNORMAL HIGH (ref 134–144)
Total Protein: 6.5 g/dL (ref 6.0–8.5)
eGFR: 91 mL/min/{1.73_m2} (ref >59)

## 2021-03-09 LAB — HEMOGLOBIN A1C
Est. average glucose Bld gHb Est-mCnc: 143 mg/dL
Hgb A1c MFr Bld: 6.6 % — ABNORMAL HIGH (ref 4.8–5.6)

## 2021-03-10 DIAGNOSIS — Z191 Hormone sensitive malignancy status: Secondary | ICD-10-CM | POA: Diagnosis not present

## 2021-03-10 DIAGNOSIS — C61 Malignant neoplasm of prostate: Secondary | ICD-10-CM | POA: Diagnosis not present

## 2021-03-10 NOTE — Progress Notes (Signed)
?  Radiation Oncology         [phone] ?________________________________ ? ?Name: ABDULLOH ULLOM [name]: [id number]  ?Date: 03/15/2021  DOB: [birth year] ? ?SIMULATION AND TREATMENT PLANNING NOTE ?PUBIC ARCH STUDY ? ?[id number], Norwood Levo, MD  [name], MD ? ?DIAGNOSIS:   71 y.o. gentleman with Stage T1c adenocarcinoma of the prostate with Gleason score of 3+4, and PSA of 9.5. ? ?Oncology History  ?Malignant neoplasm of prostate ([place])  ?09/19/2020 Cancer Staging  ? Staging form: Prostate, AJCC 8th Edition ?- Clinical stage from 09/19/2020: Stage IIB (cT1c, cN0, cM0, PSA: 9.5, Grade Group: 2) - Signed by Freeman Caldron, PA-C on 02/13/2021 ?Histopathologic type: Adenocarcinoma, NOS ?Stage prefix: Initial diagnosis ?Prostate specific antigen (PSA) range: Less than 10 ?Gleason primary pattern: 3 ?Gleason secondary pattern: 4 ?Gleason score: 7 ?Histologic grading system: 5 grade system ?Number of biopsy cores examined: 12 ?Number of biopsy cores positive: 1 ?Location of positive needle core biopsies: One side ? ?  ?02/13/2021 Initial Diagnosis  ? Malignant neoplasm of prostate ([place]) ?  ? ? ?  ICD-10-CM   ?1. Malignant neoplasm of prostate ([place])  C61   ?  ? ? ?COMPLEX SIMULATION:  The patient presented today for evaluation for possible prostate seed implant. He was brought to the radiation planning suite and placed supine on the CT couch. A 3-dimensional image study set was obtained in upload to the planning computer. There, on each axial slice, I contoured the prostate gland. Then, using three-dimensional radiation planning tools I reconstructed the prostate in view of the structures from the transperineal needle pathway to assess for possible pubic arch interference. In doing so, I did not appreciate any pubic arch interference. Also, the patient's prostate volume was estimated based on the drawn structure. The volume was 33 cc.  Given the pubic arch appearance and prostate volume, patient remains a good candidate to  proceed with prostate seed implant. Today, he freely provided informed written consent to proceed. ? ? ? ?PLAN: The patient will undergo prostate seed implant. ? ? ?________________________________ ? ?Sheral Apley Tammi Klippel, M.D. ? ? ? ? ?

## 2021-03-13 ENCOUNTER — Telehealth: Payer: Self-pay | Admitting: *Deleted

## 2021-03-13 NOTE — Telephone Encounter (Signed)
RETURNED PATIENT'S PHONE CALL, SPOKE WITH PATIENT. ?

## 2021-03-14 ENCOUNTER — Telehealth: Payer: Self-pay | Admitting: *Deleted

## 2021-03-14 NOTE — Telephone Encounter (Signed)
CALLED PATIENT TO REMND OF PRE-SEED APPTS. FOR 03-15-21, SPOKE WITH PATIENT AND HE IS AWARE OF THESE APPTS. ?

## 2021-03-15 ENCOUNTER — Ambulatory Visit
Admission: RE | Admit: 2021-03-15 | Discharge: 2021-03-15 | Disposition: A | Discharge status: Home / Self Care | Payer: Medicare HMO | Source: Ambulatory Visit | Attending: Radiation Oncology | Admitting: Radiation Oncology

## 2021-03-15 ENCOUNTER — Other Ambulatory Visit: Payer: Self-pay

## 2021-03-15 ENCOUNTER — Other Ambulatory Visit (HOSPITAL_COMMUNITY): Payer: Medicare HMO

## 2021-03-15 ENCOUNTER — Other Ambulatory Visit: Payer: Self-pay | Admitting: Urology

## 2021-03-15 ENCOUNTER — Encounter: Payer: Self-pay | Admitting: Urology

## 2021-03-15 ENCOUNTER — Encounter (HOSPITAL_COMMUNITY)
Admission: RE | Admit: 2021-03-15 | Discharge: 2021-03-15 | Disposition: A | Discharge status: Home / Self Care | Payer: Medicare HMO | Source: Ambulatory Visit | Attending: Urology | Admitting: Urology

## 2021-03-15 ENCOUNTER — Ambulatory Visit
Admission: RE | Admit: 2021-03-15 | Discharge: 2021-03-15 | Disposition: A | Discharge status: Home / Self Care | Payer: Medicare HMO | Source: Ambulatory Visit | Attending: Urology | Admitting: Urology

## 2021-03-15 DIAGNOSIS — Z01810 Encounter for preprocedural cardiovascular examination: Secondary | ICD-10-CM | POA: Insufficient documentation

## 2021-03-15 DIAGNOSIS — C61 Malignant neoplasm of prostate: Secondary | ICD-10-CM

## 2021-03-15 DIAGNOSIS — Z191 Hormone sensitive malignancy status: Secondary | ICD-10-CM | POA: Diagnosis not present

## 2021-03-15 NOTE — Progress Notes (Signed)
Pt requesting assistance with transportation for his upcoming brachytherapy appointment on 4/3.   ? ?RN sent transportation request to [facility].  Will follow up to ensure transportation is set up for patient.    ?

## 2021-03-16 LAB — PROSTATE-SPECIFIC AG, SERUM (LABCORP): Prostate Specific Ag, Serum: 10.6 ng/mL — ABNORMAL HIGH (ref 0.0–4.0)

## 2021-03-22 ENCOUNTER — Telehealth: Payer: Self-pay | Admitting: *Deleted

## 2021-03-22 ENCOUNTER — Telehealth: Payer: Medicare HMO

## 2021-03-22 NOTE — Telephone Encounter (Signed)
?  Care Management  ? ?Follow Up Note ? ? ?03/22/2021 ?Name: [name] [name]: [id number] DOB: 1950/03/02 ? ? ?Referred by: Fayrene Helper, MD ?Reason for referral : Chronic Care Management (DM2, COPD) ? ? ?A second unsuccessful telephone outreach was attempted today. The patient was referred to the case management team for assistance with care management and care coordination.  ? ?Follow Up Plan: Telephone follow up appointment with care management team member scheduled for:  upon care guide rescheduling. ? ?Jacqlyn Larsen RNC, BSN ?RN Case Manager ?[facility] ?[phone] ? ? ?

## 2021-03-28 ENCOUNTER — Telehealth: Payer: Self-pay | Admitting: Pulmonary Disease

## 2021-03-28 MED ORDER — AZITHROMYCIN 250 MG PO TABS: ORAL_TABLET | ORAL | 0 refills | Status: AC

## 2021-03-28 MED ORDER — ALBUTEROL SULFATE HFA 108 (90 BASE) MCG/ACT IN AERS: INHALATION_SPRAY | RESPIRATORY_TRACT | 5 refills | Status: DC

## 2021-03-28 MED ORDER — PREDNISONE 10 MG PO TABS: ORAL_TABLET | ORAL | 0 refills | Status: AC

## 2021-03-28 NOTE — Telephone Encounter (Signed)
Please send script for prednisone 10 mg pill >> 3 pills daily for 2 days, 2 pills daily for 2 days, 1 pill pill daily for 2 days, and then resume prednisone 5 mg daily. ? ?Please send script for Zpak. ? ?Please send refill for albuterol HFA. ? ?He should call back to schedule an appointment if not improving. ?

## 2021-03-28 NOTE — Telephone Encounter (Signed)
Called and spoke to patient.  ?Went over medication recommendations from Dr. Halford Chessman and patient voiced understanding.  ?Meds sent to [facility]. ?Patient aware that he needs to call for an appt if he has not improved after this.  ?Nothing further needed.  ?

## 2021-03-28 NOTE — Telephone Encounter (Signed)
Primary Pulmonologist: Dr. Halford Chessman  ?Last office visit and with whom: Dr. Halford Chessman 11/03/2020 ?What do we see them for (pulmonary problems): COPD with chronic bronchitis and emphysema/ Bronchiectasis/ Chronic resp failure  ?Last OV assessment/plan: See Below  ? ?Was appointment offered to patient (explain)?  No. None available in [facility] office and patient unable to travel to [place] office  ? ? ?Reason for call:  ?SOB has worsened over the last two days.  ?Uses 3LO2 normally and has not had to increase O2.  ?Patient states he does not have a pulse ox at home to be able to monitor O2 levels.  ?Denies fever, cough, wheezing.  ?States he has been using breztri inhaler as prescribed.  ?Does not have an albuterol rescue inhaler anymore. He states he ran out and never got a refill.  He hasnt used this in a few weeks. ? ?Dr. Halford Chessman is unavailable this am so routing to Dr. Vaughan Browner since he is DOD  ? ?No Known Allergies ? ?Immunization History  ?Administered Date(s) Administered  ? Fluad Quad(high Dose 65+) 08/31/2018, 10/15/2019, 09/12/2020  ? Influenza Split 10/23/2011  ? Influenza Whole 10/02/2010  ? Influenza,inj,Quad PF,6+ Mos 11/11/2012, 11/10/2013, 10/11/2014, 08/30/2015, 09/19/2016, 08/29/2017  ? Moderna Covid-19 Vaccine Bivalent Booster 10yr & up 11/17/2020  ? Moderna SARS-COV2 Booster Vaccination 10/01/2019  ? Moderna Sars-Covid-2 Vaccination 03/03/2019, 03/31/2019, 12/14/2019, 07/07/2020  ? Pneumococcal Conjugate-13 03/28/2014  ? Pneumococcal Polysaccharide-23 05/30/2010, 06/19/2015  ? Td 09/22/2003  ? Zoster Recombinat (Shingrix) 11/20/2018  ?  ?Assessment/Plan:  ?  ?Severe COPD with emphysema. ?- steroid dependent; continue prednisone 5 mg daily ?- continue breztri, singulair, prn albuterol ?  ?Bronchiectasis. ?- likely from prior episodes of pneumonia ?- has an exacerbation ?- will give course of augmentin ?- advised him to use flutter valve and mucinex bid until symptoms improve ?  ?Chronic hypoxic/hypercapnic respiratory  failure. ?- 3 liters oxygen with activity ?- uses [employer] for his DME ?  ?Social determinants of health. ?- he has limited reading ability which impacts his ability to follow through with medical instructions ?  ?Time Spent Involved in Patient Care on Day of Examination:  ?34 minutes ?  ?Follow up:  ?  ?Patient Instructions  ?Augmentin antibiotic 1 pill twice per day for 7 days. ?  ?Breztri two puffs in the morning and two puffs in the evening, and rinse your mouth after each use. ?  ?Mucinex 1200 mg in the morning and the evening to help loosen phlegm in your chest. ?  ?Flutter valve twice per day to help loosen phlegm in your chest.  Uses this after using breztri. ?  ?Albuterol 2 puffs every 4 to 6 hours as needed for cough, wheeze, chest congestion, or shortness of breath. ?  ?Follow up in 4 months. ?  ?Medication List:  ?  ?Allergies as of 11/03/2020   ?No Known Allergies ?   ?  ?   ?Medication List  ?   ?  ?   ? Accurate as of November 03, 2020 10:55 AM. If you have any questions, ask your nurse or doctor.  ?  ?   ?  ?   ?  ?STOP taking these medications   ?  ?acetaminophen 500 MG tablet ?Commonly known as: TYLENOL ?Stopped by: VChesley Mires MD ?   ?  ?   ?  ?TAKE these medications   ?  ?albuterol 108 (90 Base) MCG/ACT inhaler ?Commonly known as: VENTOLIN HFA ?INHALE 2 PUFFS INTO THE LUNGS EVERY FOUR  HOURS AS NEEDED. ONLY IF YOU CAN'T CATCH YOUR BREATH. ?   ?albuterol (2.5 MG/3ML) 0.083% nebulizer solution ?Commonly known as: PROVENTIL ?INHALE THE CONTENTS OF 1 VIAL VIA NEBULIZER EVERY 6 (SIX) HOURS AS NEEDED FOR WHEEZING OR SHORTNESS OF BREATH. ?   ?amLODipine 5 MG tablet ?Commonly known as: NORVASC ?Take 1 tablet (5 mg total) by mouth daily. ?   ?amoxicillin-clavulanate 875-125 MG tablet ?Commonly known as: AUGMENTIN ?Take 1 tablet by mouth 2 (two) times daily. ?Started by: Chesley Mires, MD ?   ?aspirin EC 81 MG tablet ?Take 81 mg by mouth daily. Swallow whole. ?   ?Breztri Aerosphere 160-9-4.8 MCG/ACT  Aero ?Generic drug: Budeson-Glycopyrrol-Formoterol ?Inhale 2 puffs into the lungs 2 (two) times daily. ?   ?metFORMIN 500 MG tablet ?Commonly known as: GLUCOPHAGE ?Take 1 tablet (500 mg total) by mouth 2 (two) times daily with a meal. ?   ?montelukast 10 MG tablet ?Commonly known as: SINGULAIR ?Take 1 tablet (10 mg total) by mouth at bedtime. ?   ?pravastatin 40 MG tablet ?Commonly known as: PRAVACHOL ?TAKE 1 TABLET AT BEDTIME ?   ?predniSONE 5 MG tablet ?Commonly known as: DELTASONE ?Take 1 tablet (5 mg total) by mouth daily with breakfast. ?   ?triamterene-hydrochlorothiazide 37.5-25 MG tablet ?Commonly known as: MAXZIDE-25 ?TAKE 1 AND 1/2 TABLETS EVERY DAY ?   ? ?

## 2021-04-03 NOTE — Progress Notes (Signed)
Pt cannot be done at [facility] d/t being supplemental oxygen dependent. Left message with Dr. Alan Ripper office to notify. ?

## 2021-04-04 ENCOUNTER — Other Ambulatory Visit: Payer: Self-pay | Admitting: Family Medicine

## 2021-04-04 ENCOUNTER — Ambulatory Visit: Payer: Medicare HMO | Admitting: Pulmonary Disease

## 2021-04-04 ENCOUNTER — Other Ambulatory Visit: Payer: Self-pay

## 2021-04-04 ENCOUNTER — Encounter: Payer: Self-pay | Admitting: Pulmonary Disease

## 2021-04-04 VITALS — BP 134/88 | HR 108 | Temp 98.5°F | Ht 67.0 in | Wt 126.4 lb

## 2021-04-04 DIAGNOSIS — J449 Chronic obstructive pulmonary disease, unspecified: Secondary | ICD-10-CM | POA: Diagnosis not present

## 2021-04-04 DIAGNOSIS — J479 Bronchiectasis, uncomplicated: Secondary | ICD-10-CM | POA: Diagnosis not present

## 2021-04-04 DIAGNOSIS — J9611 Chronic respiratory failure with hypoxia: Secondary | ICD-10-CM

## 2021-04-04 DIAGNOSIS — J9612 Chronic respiratory failure with hypercapnia: Secondary | ICD-10-CM

## 2021-04-04 DIAGNOSIS — C61 Malignant neoplasm of prostate: Secondary | ICD-10-CM

## 2021-04-04 DIAGNOSIS — Z01811 Encounter for preprocedural respiratory examination: Secondary | ICD-10-CM

## 2021-04-04 MED ORDER — BREZTRI AEROSPHERE 160-9-4.8 MCG/ACT IN AERO: 2.0000 | INHALATION_SPRAY | Freq: Two times a day (BID) | RESPIRATORY_TRACT | 0 refills | Status: DC

## 2021-04-04 MED ORDER — BREZTRI AEROSPHERE 160-9-4.8 MCG/ACT IN AERO: INHALATION_SPRAY | RESPIRATORY_TRACT | 11 refills | Status: DC

## 2021-04-04 NOTE — Progress Notes (Addendum)
COVID Vaccine Completed: yes x6 ?Date COVID Vaccine completed: 03/03/19, 03/31/19 ?Has received booster: 10/01/19, 12/14/19, 07/07/20, 11/17/20 ?COVID vaccine manufacturer: [place]  ? ?Date of COVID positive in last 90 days: no ? ?PCP - [name], MD ?Cardiologist -  ? ?Chest x-ray - n/a ?EKG - 03/15/21 Epic ?Stress Test - n/a ?ECHO - 2013 Epic ?Cardiac Cath - n/a ?Pacemaker/ICD device last checked: n/a ?Spinal Cord Stimulator: n/a ? ?Bowel Prep - fleet enema, patient has instructions ? ?Sleep Study - n/a ?CPAP -  ? ?Fasting Blood Sugar - patient unaware, no checks at home ?Checks Blood Sugar _____ times a day ? ?Blood Thinner Instructions: ?Aspirin Instructions: ASA 81, hold 5 days ?Last Dose: ? ?Activity level: Can go up a flight of stairs and perform activities of daily living without stopping and without symptoms of chest pain. SOB with activity, patient has COPD ?    ? ?Anesthesia review: COPD, HTN, DM2, pulmonary infiltrates, CVA, O2 at night 3L ? ?Patient denies shortness of breath, fever, cough and chest pain at PAT appointment ? ? ?Patient verbalized understanding of instructions that were given to them at the PAT appointment. Patient was also instructed that they will need to review over the PAT instructions again at home before surgery.  ?

## 2021-04-04 NOTE — Patient Instructions (Addendum)
DUE TO COVID-19 ONLY ONE VISITOR  (aged 73 and older)  IS ALLOWED TO COME WITH YOU AND STAY IN THE WAITING ROOM ONLY DURING PRE OP AND PROCEDURE.   ?**NO VISITORS ARE ALLOWED IN THE SHORT STAY AREA OR RECOVERY ROOM!!** ? ? Your procedure is scheduled on: 04/09/21 ? ? Report to [facility] Main Entrance ? ?  Report to admitting at 10:45 AM ? ? Call this number if you have problems the morning of surgery [phone] ? ? Do not eat food :After Midnight. ? ? After Midnight you may have the following liquids until 10:00 AM DAY OF SURGERY ? ?Water ?Black Coffee (sugar ok, NO MILK/CREAM OR CREAMERS)  ?Tea (sugar ok, NO MILK/CREAM OR CREAMERS) regular and decaf                             ?Plain Jell-O (NO RED)                                           ?Fruit ices (not with fruit pulp, NO RED)                                     ?Popsicles (NO RED)                                                                  ?Juice: apple, WHITE grape, WHITE cranberry ?Sports drinks like Gatorade (NO RED) ?Clear broth(vegetable,chicken,beef) ? ?FOLLOW BOWEL PREP AND ANY ADDITIONAL PRE OP INSTRUCTIONS YOU RECEIVED FROM YOUR SURGEON'S OFFICE!!! ?  ?  ?Oral Hygiene is also important to reduce your risk of infection.                                    ?Remember - BRUSH YOUR TEETH THE MORNING OF SURGERY WITH YOUR REGULAR TOOTHPASTE ? ? Take these medicines the morning of surgery with A SIP OF WATER: Inhalers, Amlodipine, Prednisone ? ?DO NOT TAKE ANY ORAL DIABETIC MEDICATIONS DAY OF YOUR SURGERY ? ?How to Manage Your Diabetes ?Before and After Surgery ? ?Why is it important to control my blood sugar before and after surgery? ?Improving blood sugar levels before and after surgery helps healing and can limit problems. ?A way of improving blood sugar control is eating a healthy diet by: ? Eating less sugar and carbohydrates ? Increasing activity/exercise ? Talking with your doctor about reaching your blood sugar goals ?High blood sugars  (greater than 180 mg/dL) can raise your risk of infections and slow your recovery, so you will need to focus on controlling your diabetes during the weeks before surgery. ?Make sure that the doctor who takes care of your diabetes knows about your planned surgery including the date and location. ? ?How do I manage my blood sugar before surgery? ?Check your blood sugar at least 4 times a day, starting 2 days before surgery, to make sure that the level is not too high or low. ?Check  your blood sugar the morning of your surgery when you wake up and every 2 hours until you get to the Short Stay unit. ?If your blood sugar is less than 70 mg/dL, you will need to treat for low blood sugar: ?Do not take insulin. ?Treat a low blood sugar (less than 70 mg/dL) with ? cup of clear juice (cranberry or apple), 4 glucose tablets, OR glucose gel. ?Recheck blood sugar in 15 minutes after treatment (to make sure it is greater than 70 mg/dL). If your blood sugar is not greater than 70 mg/dL on recheck, call [phone] for further instructions. ?Report your blood sugar to the short stay nurse when you get to Short Stay. ? ?If you are admitted to the hospital after surgery: ?Your blood sugar will be checked by the staff and you will probably be given insulin after surgery (instead of oral diabetes medicines) to make sure you have good blood sugar levels. ?The goal for blood sugar control after surgery is 80-180 mg/dL. ? ? ?WHAT DO I DO ABOUT MY DIABETES MEDICATION? ? ?Do not take oral diabetes medicines (pills) the morning of surgery. ? ?THE DAY BEFORE SURGERY, take Metformin as prescribed.     ? ? ?THE MORNING OF SURGERY, do not take Metformin.  ? ?Reviewed and Endorsed by [facility] Patient Education Committee, August 2015  ?                  ?           You may not have any metal on your body including jewelry, and body piercing ? ?           Do not wear lotions, powders, cologne, or deodorant  ? ?            Men may shave face and  neck. ? ? Do not bring valuables to the hospital. [facility] NOT ?            RESPONSIBLE   FOR VALUABLES. ? ? Contacts, dentures or bridgework may not be worn into surgery. ?  ? Patients discharged on the day of surgery will not be allowed to drive home.  Someone NEEDS to stay with you for the first 24 hours after anesthesia. ? ?            Please read over the following fact sheets you were given: [facility] [place] [phone]- Apolonio Schneiders ? ?    - Preparing for Surgery ?Before surgery, you can play an important role.  Because skin is not sterile, your skin needs to be as free of germs as possible.  You can reduce the number of germs on your skin by washing with CHG (chlorahexidine gluconate) soap before surgery.  CHG is an antiseptic cleaner which kills germs and bonds with the skin to continue killing germs even after washing. ?Please DO NOT use if you have an allergy to CHG or antibacterial soaps.  If your skin becomes reddened/irritated stop using the CHG and inform your nurse when you arrive at Short Stay. ?Do not shave (including legs and underarms) for at least 48 hours prior to the first CHG shower.  You may shave your face/neck. ? ?Please follow these instructions carefully: ? 1.  Shower with CHG Soap the night before surgery and the  morning of surgery. ? 2.  If you choose to wash your hair, wash your hair first as usual with your normal  shampoo. ? 3.  After you shampoo, rinse your hair and body thoroughly to remove the shampoo.                            ? 4.  Use CHG as you would any other liquid soap.  You can apply chg directly to the skin and wash.  Gently with a scrungie or clean washcloth. ? 5.  Apply the CHG Soap to your body ONLY FROM THE NECK DOWN.   Do   not use on face/ open      ?                     Wound or open sores. Avoid contact with eyes, ears mouth and   genitals (private parts).  ?                     Production manager,  Genitals  (private parts) with your normal soap. ?            6.  Wash thoroughly, paying special attention to the area where your    surgery  will be performed. ? 7.  Thoroughly rinse your body with warm water from the neck down. ? 8.  DO NOT shower/wash with your normal soap after using and rinsing off the CHG Soap. ?               9.  Pat yourself dry with a clean towel. ?           10.  Wear clean pajamas. ?           11.  Place clean sheets on your bed the night of your first shower and do not  sleep with pets. ?Day of Surgery : ?Do not apply any lotions/deodorants the morning of surgery.  Please wear clean clothes to the hospital/surgery center. ? ?FAILURE TO FOLLOW THESE INSTRUCTIONS MAY RESULT IN THE CANCELLATION OF YOUR SURGERY ? ?PATIENT SIGNATURE_________________________________ ? ?NURSE SIGNATURE__________________________________ ? ?________________________________________________________________________  ?

## 2021-04-04 NOTE — Addendum Note (Signed)
Addended by: Fritzi Mandes D on: 04/04/2021 11:12 AM ? ? Modules accepted: Orders ? ?

## 2021-04-04 NOTE — Progress Notes (Signed)
? ?[facility] ? ?Chief Complaint  ?Patient presents with  ? Follow-up  ?  Cough is about the same since last ov.  ?Has risk assessment forms to be filled out for [facility]  ? ? ?Constitutional:  ?BP 134/88 (BP Location: Left Arm, Patient Position: Sitting)   Pulse (!) 108   Temp 98.5 ?F (36.9 ?C) (Temporal)   Ht _0  (1.702 m)   Wt 126 lb 6.4 oz (57.3 kg)   SpO2 97% Comment: ra  BMI 19.80 kg/m?  ? ?Past Medical History:  ?Pneumonia, CVA, Depression, HLD, HTN, Prostate cancer ? ?Past Surgical History:  ?He  has no past surgical history on file. ? ?Brief Summary:  ?[name] is a 71 y.o. male former cigarette and marijuana smoker with COPD with emphysema and bronchiectasis, and chronic hypoxic/hypercapnic respiratory failure.  ?  ? ? ? ?Subjective:  ? ?He had trouble with his breathing earlier this month.  Treated with Zpak and prednisone taper.  Breathing back to baseline.  He uses oxygen during the day with activity.  He sleeps with oxygen sometimes.  Feels his breathing is rough in the morning, but gets better as the day goes on.  Not having cough, wheeze, or sputum. ? ?He is scheduled for brachytherapy and space oar gel placement for prostate cancer in April. ? ?Physical Exam:  ? ?Appearance - well kempt  ? ?ENMT - no sinus tenderness, no oral exudate, no LAN, Mallampati 2 airway, no stridor ? ?Respiratory - decreased breath sounds bilaterally, no wheezing or rales ? ?CV - s1s2 regular rate and rhythm, no murmurs ? ?Ext - no clubbing, no edema ? ?Skin - no rashes ? ?Psych - normal mood and affect ? ? ? ?  ?Pulmonary testing:  ?ABG on 28% FiO2 12/11/18 >> pH 7.385, PCO2 50.2, PO2 102 ?PFT 07/29/19 >> FEV1 0.63 (24%), FEV1% 32, TLC 9.24 (143%), DLCO 63% ? ?Chest Imaging:  ?CT chest 07/05/19 >> mild centrilobular and paraseptal emphysema, BTX lower lobes, bulla RLL ? ?Social History:  ?He  reports that he quit smoking about 5 years ago. His smoking  use included cigarettes. He has a 20.00 pack-year smoking history. He has never used smokeless tobacco. He reports that he does not currently use alcohol after a past usage of about 3.0 standard drinks per week. He reports that he does not use drugs. ? ?Family History:  ?His family history is not on file. ?  ? ? ?Assessment/Plan:  ? ?Severe COPD with emphysema. ?- steroid dependent; continue prednisone 5 mg daily ?- continue breztri ?- prn albuterol ?- reviewed roles for his different inhalers ? ?Bronchiectasis. ?- likely from prior episodes of pneumonia ?- prn mucinex, flutter valve ? ?Chronic hypoxic/hypercapnic respiratory failure. ?- 3 liters oxygen with activity and have him use at night now also ?- uses [employer] for his DME ?- will see if he can get a portable pulse oximeter ? ?Social determinants of health. ?- he has limited reading ability which impacts his ability to follow through with medical instructions ? ?Prostate cancer. ?- he is scheduled for brachytherapy and space oar gel placement with Dr. [name] on April 09, 2021 ?- his pulmonary status is optimize and he can go ahead with procedure ?- he should continue inhaler regimen during the perioperative period ?- if he has trouble with blood pressure after procedure, then he might need stress dose steroids ?- he should have close monitoring of his oxygenation in  the perioperative period ? ?Time Spent Involved in Patient Care on Day of Examination:  ?38 minutes ? ?Follow up:  ? ?There are no Patient Instructions on file for this visit. ? ?Medication List:  ? ?Allergies as of 04/04/2021   ?No Known Allergies ?  ? ?  ?Medication List  ?  ? ?  ? Accurate as of April 04, 2021 10:10 AM. If you have any questions, ask your nurse or doctor.  ?  ?  ? ?  ? ?albuterol (2.5 MG/3ML) 0.083% nebulizer solution ?Commonly known as: PROVENTIL ?INHALE THE CONTENTS OF 1 VIAL VIA NEBULIZER EVERY 6 (SIX) HOURS AS NEEDED FOR WHEEZING OR SHORTNESS OF BREATH. ?  ?albuterol  108 (90 Base) MCG/ACT inhaler ?Commonly known as: VENTOLIN HFA ?INHALE 2 PUFFS INTO THE LUNGS EVERY FOUR HOURS AS NEEDED. ONLY IF YOU CAN'T CATCH YOUR BREATH. ?  ?amLODipine 5 MG tablet ?Commonly known as: NORVASC ?Take 1 tablet (5 mg total) by mouth daily. ?  ?aspirin EC 81 MG tablet ?Take 81 mg by mouth daily. Swallow whole. ?  ?blood glucose meter kit and supplies ?Dispense based on patient and insurance preference. Once daily testing DX E11.9 ?  ?Breztri Aerosphere 160-9-4.8 MCG/ACT Aero ?Generic drug: Budeson-Glycopyrrol-Formoterol ?INHALE (2) PUFFS INTO THE LUNGS IN THE MORNING AND AT BEDTIME ?  ?fluticasone 50 MCG/ACT nasal spray ?Commonly known as: FLONASE ?Place 1 spray into both nostrils daily. ?  ?glucose blood test strip ?Use as instructed once daily dx e11.9 ?  ?Lancets 30G Misc ?Once daily testing dx e11.9 ?  ?metFORMIN 500 MG tablet ?Commonly known as: GLUCOPHAGE ?TAKE 1 TABLET TWICE DAILY WITH MEALS ?  ?Moderna COVID-19 Bival Booster 50 MCG/0.5ML injection ?Generic drug: COVID-19 mRNA bivalent vaccine (Moderna) ?  ?pravastatin 40 MG tablet ?Commonly known as: PRAVACHOL ?TAKE 1 TABLET AT BEDTIME ?  ?predniSONE 5 MG tablet ?Commonly known as: DELTASONE ?TAKE 1 TABLET (5 MG TOTAL) BY MOUTH DAILY WITH BREAKFAST. ?  ?triamterene-hydrochlorothiazide 37.5-25 MG tablet ?Commonly known as: MAXZIDE-25 ?TAKE 1 AND 1/2 TABLETS EVERY DAY ?  ? ?  ? ? ?Signature:  ?Chesley Mires, MD ?[place] ?Pager - [phone] - [phone] ?04/04/2021, 10:10 AM ?  ? ? ? ? ? ? ? ? ?

## 2021-04-04 NOTE — Patient Instructions (Signed)
Try using your oxygen at night to sleep and see if this helps your breathing in the morning ? ?Follow up in 4 months ?

## 2021-04-05 ENCOUNTER — Encounter (HOSPITAL_COMMUNITY): Payer: Self-pay

## 2021-04-05 ENCOUNTER — Encounter (HOSPITAL_COMMUNITY)
Admission: RE | Admit: 2021-04-05 | Discharge: 2021-04-05 | Disposition: A | Discharge status: Home / Self Care | Payer: Medicare HMO | Source: Ambulatory Visit | Attending: Urology | Admitting: Urology

## 2021-04-05 VITALS — BP 154/79 | HR 77 | Temp 97.8°F | Resp 16 | Ht 67.0 in | Wt 123.5 lb

## 2021-04-05 DIAGNOSIS — Z87891 Personal history of nicotine dependence: Secondary | ICD-10-CM | POA: Insufficient documentation

## 2021-04-05 DIAGNOSIS — I10 Essential (primary) hypertension: Secondary | ICD-10-CM | POA: Insufficient documentation

## 2021-04-05 DIAGNOSIS — Z9981 Dependence on supplemental oxygen: Secondary | ICD-10-CM | POA: Diagnosis not present

## 2021-04-05 DIAGNOSIS — J9692 Respiratory failure, unspecified with hypercapnia: Secondary | ICD-10-CM | POA: Insufficient documentation

## 2021-04-05 DIAGNOSIS — J449 Chronic obstructive pulmonary disease, unspecified: Secondary | ICD-10-CM | POA: Insufficient documentation

## 2021-04-05 DIAGNOSIS — Z01812 Encounter for preprocedural laboratory examination: Secondary | ICD-10-CM | POA: Insufficient documentation

## 2021-04-05 DIAGNOSIS — C61 Malignant neoplasm of prostate: Secondary | ICD-10-CM | POA: Diagnosis not present

## 2021-04-05 DIAGNOSIS — Z8673 Personal history of transient ischemic attack (TIA), and cerebral infarction without residual deficits: Secondary | ICD-10-CM | POA: Insufficient documentation

## 2021-04-05 LAB — CBC
HCT: 44.1 % (ref 39.0–52.0)
Hemoglobin: 14.1 g/dL (ref 13.0–17.0)
MCH: 30.5 pg (ref 26.0–34.0)
MCHC: 32 g/dL (ref 30.0–36.0)
MCV: 95.5 fL (ref 80.0–100.0)
Platelets: 241 10*3/uL (ref 150–400)
RBC: 4.62 MIL/uL (ref 4.22–5.81)
RDW: 12.4 % (ref 11.5–15.5)
WBC: 7.5 10*3/uL (ref 4.0–10.5)
nRBC: 0 % (ref 0.0–0.2)

## 2021-04-05 LAB — BASIC METABOLIC PANEL
Anion gap: 8 (ref 5–15)
BUN: 17 mg/dL (ref 8–23)
CO2: 31 mmol/L (ref 22–32)
Calcium: 9.4 mg/dL (ref 8.9–10.3)
Chloride: 98 mmol/L (ref 98–111)
Creatinine, Ser: 0.84 mg/dL (ref 0.61–1.24)
GFR, Estimated: >60 mL/min (ref >60)
Glucose, Bld: 119 mg/dL — ABNORMAL HIGH (ref 70–99)
Potassium: 3.4 mmol/L — ABNORMAL LOW (ref 3.5–5.1)
Sodium: 137 mmol/L (ref 135–145)

## 2021-04-05 LAB — GLUCOSE, CAPILLARY: Glucose-Capillary: 115 mg/dL — ABNORMAL HIGH (ref 70–99)

## 2021-04-05 NOTE — Progress Notes (Signed)
During appointment explained that patient is scheduled to go home the same day and  he will need someone with him for 24 hours. Patient stated that he does not have anyone at home and no one that can stay with him over night. Asked if he had a way to get home. He stated his nurse Inez Catalina could take him home but most likely would not be able to stay with him. Called surgeon's office and left a voicemail explaining situation with a call back number. ?

## 2021-04-06 ENCOUNTER — Telehealth: Payer: Self-pay | Admitting: *Deleted

## 2021-04-06 ENCOUNTER — Encounter (HOSPITAL_COMMUNITY): Payer: Self-pay | Admitting: Urology

## 2021-04-06 NOTE — Anesthesia Preprocedure Evaluation (Addendum)
Anesthesia Evaluation  ?Patient identified by [name], date of birth, ID band ?Patient awake ? ? ? ?Reviewed: ?Allergy & Precautions, NPO status , Patient's Chart, lab work & pertinent test results ? ?Airway ?Mallampati: I ? ? ? ? ? ? Dental ? ?(+) Poor Dentition, Missing ?  ?Pulmonary ?asthma , COPD,  COPD inhaler, former smoker,  ?  ?Pulmonary exam normal ? ? ? ? ? ? ? Cardiovascular ?hypertension, Pt. on medications ?Normal cardiovascular exam ? ? ?  ?Neuro/Psych ?  ? GI/Hepatic ?negative GI ROS, Neg liver ROS,   ?Endo/Other  ?diabetes, Type 2, Oral Hypoglycemic Agents ? Renal/GU ?negative Renal ROS  ?negative genitourinary ?  ?Musculoskeletal ?negative musculoskeletal ROS ?(+)  ? Abdominal ?Normal abdominal exam  (+)   ?Peds ? Hematology ?negative hematology ROS ?(+)   ?Anesthesia Other Findings ? ? Reproductive/Obstetrics ? ?  ? ? ? ? ? ? ? ? ? ? ? ? ? ?  ?  ? ? ? ? ? ? ? ?Anesthesia Physical ?Anesthesia Plan ? ?ASA: 2 ? ?Anesthesia Plan: General  ? ?Post-op Pain Management:   ? ?Induction: Intravenous ? ?PONV Risk Score and Plan: 3 and Ondansetron ? ?Airway Management Planned:  ? ?Additional Equipment: None ? ?Intra-op Plan:  ? ?Post-operative Plan: Extubation in OR ? ?Informed Consent: I have reviewed the patients History and Physical, chart, labs and discussed the procedure including the risks, benefits and alternatives for the proposed anesthesia with the patient or authorized representative who has indicated his/her understanding and acceptance.  ? ? ? ?Dental advisory given ? ?Plan Discussed with: CRNA ? ?Anesthesia Plan Comments: (See APP note by Durel Salts, FNP )  ? ? ? ? ?Anesthesia Quick Evaluation ? ?

## 2021-04-06 NOTE — Progress Notes (Signed)
Anesthesia Chart Review: ? ? Case: [id number] Date/Time: 04/09/21 1245  ? Procedures:  ?    RADIOACTIVE SEED IMPLANT/BRACHYTHERAPY IMPLANT - 90 MINS ?    SPACE OAR INSTILLATION  ? Anesthesia type: Choice  ? Pre-op diagnosis: PROSTATE CANCER  ? Location: [facility] ROOM 06 / [facility]  ? Surgeons: [name], MD  ? ?  ? ? ?DISCUSSION: ?Pt is 71 years old with hx COPD, bronchiectasis, chronic hypoxic/hypercapnic respiratory failure (uses 3L O2), CVA (2008), HTN. Former smoker, quit 2017 ? ? ?VS: BP (!) 154/79   Pulse 77   Temp 36.6 ?C (Oral)   Resp 16   Ht '5\' 7"'$  (1.702 m)   Wt 56 kg   SpO2 97%   BMI 19.34 kg/m?  ? ? ?PROVIDERS: ?- PCP is Fayrene Helper, MD ?- Pulmonologist is Chesley Mires, MD. Last office visit 04/04/21. He is aware of upcoming procedure, noting: ?"- his pulmonary status is optimize and he can go ahead with procedure ?- he should continue inhaler regimen during the perioperative period ?- if he has trouble with blood pressure after procedure, then he might need stress dose steroids ?- he should have close monitoring of his oxygenation in the perioperative period" ? ? ?LABS: Labs reviewed: Acceptable for surgery. ?(all labs ordered are listed, but only abnormal results are displayed) ? ?Labs Reviewed  ?BASIC METABOLIC PANEL - Abnormal; Notable for the following components:  ?    Result Value  ? Potassium 3.4 (*)   ? Glucose, Bld 119 (*)   ? All other components within normal limits  ?GLUCOSE, CAPILLARY - Abnormal; Notable for the following components:  ? Glucose-Capillary 115 (*)   ? All other components within normal limits  ?CBC  ? ? ?EKG 03/15/21: NSR. Septal infarct , age undetermined. LVH ? ? ?CV: none recent. Echo 2013 normal except calcified mitral annulus ? ? ?Past Medical History:  ?Diagnosis Date  ? Asthma   ? CAP (community acquired pneumonia) 12/10/2018  ? COPD (chronic obstructive pulmonary disease) ([place])   ? COPD with acute exacerbation ([place]) 04/18/2018  ? CVA (cerebral vascular  accident) [facility]) 2008  ? with temporary vision loss   ? Depression   ? ECHOCARDIOGRAM, ABNORMAL 03/06/2010  ? Qualifier: Diagnosis of  By: Purcell Nails, NP, Curt Bears    ? Elevated PSA 2014  ? no diagnosis pf prostate cancer in 07/2014  ? History of substance abuse ([place]) 01/14/2011  ? Marijuana  ? History of substance abuse ([place]) 01/14/2011  ? History of tobacco abuse 01/14/2011  ? Hyperlipidemia   ? Hypertension   ? Lobar pneumonia ([place]) 12/13/2018  ? Marijuana abuse   ? Nicotine addiction   ? Prediabetes 2014  ? ? ?Past Surgical History:  ?Procedure Laterality Date  ? COLONOSCOPY    ? ? ?MEDICATIONS: ? albuterol (PROVENTIL) (2.5 MG/3ML) 0.083% nebulizer solution  ? albuterol (VENTOLIN HFA) 108 (90 Base) MCG/ACT inhaler  ? amLODipine (NORVASC) 5 MG tablet  ? aspirin EC 81 MG tablet  ? Budeson-Glycopyrrol-Formoterol (BREZTRI AEROSPHERE) 160-9-4.8 MCG/ACT AERO  ? Budeson-Glycopyrrol-Formoterol (BREZTRI AEROSPHERE) 160-9-4.8 MCG/ACT AERO  ? fluticasone (FLONASE) 50 MCG/ACT nasal spray  ? ibuprofen (ADVIL) 200 MG tablet  ? metFORMIN (GLUCOPHAGE) 500 MG tablet  ? pravastatin (PRAVACHOL) 40 MG tablet  ? predniSONE (DELTASONE) 5 MG tablet  ? triamterene-hydrochlorothiazide (MAXZIDE-25) 37.5-25 MG tablet  ? ?No current facility-administered medications for this encounter.  ? ? ?If no changes, I anticipate pt can proceed with surgery as scheduled.  ? ?Willeen Cass,  PhD, FNP-BC ?[facility]/Anesthesiology ?Phone: [phone] ?04/06/2021 9:42 AM ? ? ? ? ? ? ?

## 2021-04-06 NOTE — Telephone Encounter (Signed)
CALLED PATIENT TO REMIND OF PROCEDURE  FOR 04-09-21, LVM FOR A RETURN CALL ?

## 2021-04-09 ENCOUNTER — Encounter (HOSPITAL_COMMUNITY)
Admission: RE | Disposition: A | Discharge status: Home / Self Care | Payer: Self-pay | Source: Ambulatory Visit | Attending: Urology

## 2021-04-09 ENCOUNTER — Ambulatory Visit (HOSPITAL_BASED_OUTPATIENT_CLINIC_OR_DEPARTMENT_OTHER): Payer: Medicare HMO | Admitting: Anesthesiology

## 2021-04-09 ENCOUNTER — Encounter (HOSPITAL_COMMUNITY): Payer: Self-pay | Admitting: Urology

## 2021-04-09 ENCOUNTER — Observation Stay (HOSPITAL_COMMUNITY)
Admission: RE | Admit: 2021-04-09 | Discharge: 2021-04-10 | Disposition: A | Discharge status: Home / Self Care | Payer: Medicare HMO | Source: Ambulatory Visit | Attending: Urology | Admitting: Urology

## 2021-04-09 ENCOUNTER — Ambulatory Visit (HOSPITAL_COMMUNITY): Payer: Medicare HMO | Admitting: Emergency Medicine

## 2021-04-09 ENCOUNTER — Ambulatory Visit (HOSPITAL_COMMUNITY): Payer: Medicare HMO

## 2021-04-09 DIAGNOSIS — C61 Malignant neoplasm of prostate: Secondary | ICD-10-CM

## 2021-04-09 DIAGNOSIS — Z87891 Personal history of nicotine dependence: Secondary | ICD-10-CM | POA: Diagnosis not present

## 2021-04-09 DIAGNOSIS — J45909 Unspecified asthma, uncomplicated: Secondary | ICD-10-CM | POA: Diagnosis not present

## 2021-04-09 DIAGNOSIS — R7303 Prediabetes: Secondary | ICD-10-CM | POA: Insufficient documentation

## 2021-04-09 DIAGNOSIS — Z8673 Personal history of transient ischemic attack (TIA), and cerebral infarction without residual deficits: Secondary | ICD-10-CM | POA: Insufficient documentation

## 2021-04-09 DIAGNOSIS — Z191 Hormone sensitive malignancy status: Secondary | ICD-10-CM | POA: Diagnosis not present

## 2021-04-09 DIAGNOSIS — I10 Essential (primary) hypertension: Secondary | ICD-10-CM | POA: Diagnosis not present

## 2021-04-09 DIAGNOSIS — J449 Chronic obstructive pulmonary disease, unspecified: Secondary | ICD-10-CM | POA: Diagnosis not present

## 2021-04-09 HISTORY — PX: SPACE OAR INSTILLATION: SHX6769

## 2021-04-09 HISTORY — PX: RADIOACTIVE SEED IMPLANT: SHX5150

## 2021-04-09 LAB — GLUCOSE, CAPILLARY
Glucose-Capillary: 108 mg/dL — ABNORMAL HIGH (ref 70–99)
Glucose-Capillary: 108 mg/dL — ABNORMAL HIGH (ref 70–99)

## 2021-04-09 SURGERY — INSERTION, RADIATION SOURCE, PROSTATE
Anesthesia: General

## 2021-04-09 MED ORDER — FENTANYL CITRATE PF 50 MCG/ML IJ SOSY: 25.0000 ug | PREFILLED_SYRINGE | INTRAMUSCULAR | Status: DC | PRN

## 2021-04-09 MED ORDER — SODIUM CHLORIDE 0.9 % IR SOLN
Status: DC | PRN
  Administered 2021-04-09: 1000 mL via INTRAVESICAL

## 2021-04-09 MED ORDER — OXYCODONE HCL 5 MG PO TABS: 5.0000 mg | ORAL_TABLET | Freq: Once | ORAL | Status: DC | PRN

## 2021-04-09 MED ORDER — CHLORHEXIDINE GLUCONATE 0.12 % MT SOLN
15.0000 mL | Freq: Once | OROMUCOSAL | Status: AC
  Administered 2021-04-09: 15 mL via OROMUCOSAL

## 2021-04-09 MED ORDER — PREDNISONE 5 MG PO TABS
5.0000 mg | ORAL_TABLET | Freq: Every day | ORAL | Status: DC
  Administered 2021-04-10: 5 mg via ORAL
  Filled 2021-04-09: qty 1

## 2021-04-09 MED ORDER — ACETAMINOPHEN 325 MG PO TABS: 650.0000 mg | ORAL_TABLET | ORAL | Status: DC | PRN

## 2021-04-09 MED ORDER — SODIUM CHLORIDE FLUSH 0.9 % IV SOLN
INTRAVENOUS | Status: DC | PRN
Start: 2021-04-09 — End: 2021-04-09
  Administered 2021-04-09: 9 mL via INTRAVENOUS

## 2021-04-09 MED ORDER — SODIUM CHLORIDE 0.9% FLUSH: 3.0000 mL | INTRAVENOUS | Status: DC | PRN

## 2021-04-09 MED ORDER — FENTANYL CITRATE (PF) 100 MCG/2ML IJ SOLN
INTRAMUSCULAR | Status: DC | PRN
  Administered 2021-04-09 (×2): 50 ug via INTRAVENOUS

## 2021-04-09 MED ORDER — DEXAMETHASONE SODIUM PHOSPHATE 4 MG/ML IJ SOLN
INTRAMUSCULAR | Status: DC | PRN
  Administered 2021-04-09: 5 mg via INTRAVENOUS

## 2021-04-09 MED ORDER — IOHEXOL 300 MG/ML  SOLN
INTRAMUSCULAR | Status: DC | PRN
  Administered 2021-04-09: 5 mL

## 2021-04-09 MED ORDER — TRIAMTERENE-HCTZ 37.5-25 MG PO TABS
1.5000 | ORAL_TABLET | Freq: Every day | ORAL | Status: DC
  Administered 2021-04-09: 1.5 via ORAL
  Filled 2021-04-09 (×2): qty 1.5

## 2021-04-09 MED ORDER — SODIUM CHLORIDE (PF) 0.9 % IJ SOLN
INTRAMUSCULAR | Status: AC
  Filled 2021-04-09: qty 10

## 2021-04-09 MED ORDER — SODIUM CHLORIDE 0.9% FLUSH
3.0000 mL | Freq: Two times a day (BID) | INTRAVENOUS | Status: DC
  Administered 2021-04-09: 3 mL via INTRAVENOUS

## 2021-04-09 MED ORDER — CEFAZOLIN SODIUM-DEXTROSE 2-4 GM/100ML-% IV SOLN
2.0000 g | Freq: Once | INTRAVENOUS | Status: AC
  Administered 2021-04-09: 2 g via INTRAVENOUS
  Filled 2021-04-09: qty 100

## 2021-04-09 MED ORDER — MIDAZOLAM HCL 5 MG/5ML IJ SOLN
INTRAMUSCULAR | Status: DC | PRN
Start: 2021-04-09 — End: 2021-04-09
  Administered 2021-04-09: 1 mg via INTRAVENOUS

## 2021-04-09 MED ORDER — ACETAMINOPHEN 160 MG/5ML PO SOLN: 325.0000 mg | ORAL | Status: DC | PRN

## 2021-04-09 MED ORDER — PROPOFOL 10 MG/ML IV BOLUS
INTRAVENOUS | Status: DC | PRN
  Administered 2021-04-09: 150 mg via INTRAVENOUS

## 2021-04-09 MED ORDER — PHENYLEPHRINE HCL (PRESSORS) 10 MG/ML IV SOLN
INTRAVENOUS | Status: DC | PRN
  Administered 2021-04-09 (×2): 80 ug via INTRAVENOUS

## 2021-04-09 MED ORDER — METFORMIN HCL 500 MG PO TABS
500.0000 mg | ORAL_TABLET | Freq: Two times a day (BID) | ORAL | Status: DC
  Administered 2021-04-10: 500 mg via ORAL
  Filled 2021-04-09: qty 1

## 2021-04-09 MED ORDER — FLUTICASONE FUROATE-VILANTEROL 100-25 MCG/ACT IN AEPB
1.0000 | INHALATION_SPRAY | Freq: Every day | RESPIRATORY_TRACT | Status: DC
  Administered 2021-04-10: 1 via RESPIRATORY_TRACT
  Filled 2021-04-09: qty 28

## 2021-04-09 MED ORDER — FENTANYL CITRATE (PF) 100 MCG/2ML IJ SOLN
INTRAMUSCULAR | Status: AC
  Filled 2021-04-09: qty 2

## 2021-04-09 MED ORDER — UMECLIDINIUM BROMIDE 62.5 MCG/ACT IN AEPB
1.0000 | INHALATION_SPRAY | Freq: Every day | RESPIRATORY_TRACT | Status: DC
  Administered 2021-04-10: 1 via RESPIRATORY_TRACT
  Filled 2021-04-09: qty 7

## 2021-04-09 MED ORDER — ACETAMINOPHEN 325 MG PO TABS: 325.0000 mg | ORAL_TABLET | ORAL | Status: DC | PRN

## 2021-04-09 MED ORDER — ZOLPIDEM TARTRATE 5 MG PO TABS: 5.0000 mg | ORAL_TABLET | Freq: Every evening | ORAL | Status: DC | PRN

## 2021-04-09 MED ORDER — ALBUTEROL SULFATE (2.5 MG/3ML) 0.083% IN NEBU: 2.5000 mg | INHALATION_SOLUTION | Freq: Four times a day (QID) | RESPIRATORY_TRACT | Status: DC | PRN

## 2021-04-09 MED ORDER — LIDOCAINE HCL (CARDIAC) PF 100 MG/5ML IV SOSY
PREFILLED_SYRINGE | INTRAVENOUS | Status: DC | PRN
  Administered 2021-04-09: 60 mg via INTRAVENOUS

## 2021-04-09 MED ORDER — SODIUM CHLORIDE 0.9 % IV SOLN: 250.0000 mL | INTRAVENOUS | Status: DC | PRN

## 2021-04-09 MED ORDER — SENNA 8.6 MG PO TABS
1.0000 | ORAL_TABLET | Freq: Two times a day (BID) | ORAL | Status: DC
  Administered 2021-04-09 – 2021-04-10 (×2): 8.6 mg via ORAL
  Filled 2021-04-09 (×2): qty 1

## 2021-04-09 MED ORDER — MIDAZOLAM HCL 2 MG/2ML IJ SOLN
INTRAMUSCULAR | Status: AC
  Filled 2021-04-09: qty 2

## 2021-04-09 MED ORDER — ORAL CARE MOUTH RINSE: 15.0000 mL | Freq: Once | OROMUCOSAL | Status: AC

## 2021-04-09 MED ORDER — ALBUTEROL SULFATE HFA 108 (90 BASE) MCG/ACT IN AERS: 1.0000 | INHALATION_SPRAY | RESPIRATORY_TRACT | Status: DC | PRN

## 2021-04-09 MED ORDER — FLEET ENEMA 7-19 GM/118ML RE ENEM: 1.0000 | ENEMA | Freq: Once | RECTAL | Status: DC

## 2021-04-09 MED ORDER — AMLODIPINE BESYLATE 5 MG PO TABS
5.0000 mg | ORAL_TABLET | Freq: Every day | ORAL | Status: DC
  Administered 2021-04-10: 5 mg via ORAL
  Filled 2021-04-09: qty 1

## 2021-04-09 MED ORDER — LACTATED RINGERS IV SOLN: INTRAVENOUS | Status: DC

## 2021-04-09 MED ORDER — FLUTICASONE PROPIONATE 50 MCG/ACT NA SUSP: 1.0000 | Freq: Every day | NASAL | Status: DC | PRN

## 2021-04-09 MED ORDER — ONDANSETRON HCL 4 MG/2ML IJ SOLN: 4.0000 mg | Freq: Once | INTRAMUSCULAR | Status: DC | PRN

## 2021-04-09 MED ORDER — OXYCODONE HCL 5 MG/5ML PO SOLN: 5.0000 mg | Freq: Once | ORAL | Status: DC | PRN

## 2021-04-09 MED ORDER — BUDESON-GLYCOPYRROL-FORMOTEROL 160-9-4.8 MCG/ACT IN AERO: 2.0000 | INHALATION_SPRAY | Freq: Two times a day (BID) | RESPIRATORY_TRACT | Status: DC

## 2021-04-09 MED ORDER — HYDROCODONE-ACETAMINOPHEN 5-325 MG PO TABS: 1.0000 | ORAL_TABLET | ORAL | Status: DC | PRN

## 2021-04-09 MED ORDER — ONDANSETRON HCL 4 MG/2ML IJ SOLN
INTRAMUSCULAR | Status: DC | PRN
  Administered 2021-04-09: 4 mg via INTRAVENOUS

## 2021-04-09 SURGICAL SUPPLY — 35 items
BAG COUNTER SPONGE SURGICOUNT (BAG) IMPLANT
BAG DRN RND TRDRP ANRFLXCHMBR (UROLOGICAL SUPPLIES) ×1
BAG SPNG CNTER NS LX DISP (BAG)
BAG URINE DRAIN 2000ML AR STRL (UROLOGICAL SUPPLIES) ×2 IMPLANT
BLADE CLIPPER SURG (BLADE) ×2 IMPLANT
CATH FOLEY 2WAY SLVR  5CC 16FR (CATHETERS) ×2
CATH FOLEY 2WAY SLVR 5CC 16FR (CATHETERS) ×1 IMPLANT
CATH ROBINSON RED A/P 20FR (CATHETERS) ×2 IMPLANT
COVER BACK TABLE 60X90IN (DRAPES) ×2 IMPLANT
COVER SURGICAL LIGHT HANDLE (MISCELLANEOUS) ×2 IMPLANT
DRAPE SURG IRRIG POUCH 19X23 (DRAPES) ×2 IMPLANT
DRAPE U-SHAPE 47X51 STRL (DRAPES) ×2 IMPLANT
DRSG TEGADERM 4X4.75 (GAUZE/BANDAGES/DRESSINGS) ×2 IMPLANT
DRSG TEGADERM 8X12 (GAUZE/BANDAGES/DRESSINGS) ×2 IMPLANT
GAUZE SPONGE 4X4 12PLY STRL (GAUZE/BANDAGES/DRESSINGS) ×1 IMPLANT
GLOVE SURG ENC MOIS LTX SZ8 (GLOVE) ×2 IMPLANT
GLOVE SURG ENC TEXT LTX SZ8 (GLOVE) ×2 IMPLANT
GLOVE SURG POLYISO LF SZ8 (GLOVE) IMPLANT
GOWN STRL REUS W/ TWL XL LVL3 (GOWN DISPOSABLE) ×1 IMPLANT
GOWN STRL REUS W/TWL XL LVL3 (GOWN DISPOSABLE) ×2
HOLDER FOLEY CATH W/STRAP (MISCELLANEOUS) ×2 IMPLANT
IMPL SPACEOAR VUE SYSTEM (Spacer) ×1 IMPLANT
IMPLANT SEEDS (Urological Implant) ×65 IMPLANT
IMPLANT SPACEOAR VUE SYSTEM (Spacer) ×2 IMPLANT
MARKER SKIN DUAL TIP RULER LAB (MISCELLANEOUS) ×4 IMPLANT
NDL SAFETY ECLIPSE 18X1.5 (NEEDLE) ×1 IMPLANT
NEEDLE HYPO 18GX1.5 SHARP (NEEDLE) ×2
PACK CYSTO (CUSTOM PROCEDURE TRAY) ×2 IMPLANT
PENCIL SMOKE EVACUATOR (MISCELLANEOUS) IMPLANT
PREP POVIDONE IODINE SPRAY 2OZ (MISCELLANEOUS) ×1 IMPLANT
STRIP CLOSURE SKIN 1/2X4 (GAUZE/BANDAGES/DRESSINGS) ×2 IMPLANT
SURGILUBE 2OZ TUBE FLIPTOP (MISCELLANEOUS) ×2 IMPLANT
SYR 10ML LL (SYRINGE) ×3 IMPLANT
TOWEL OR 17X26 10 PK STRL BLUE (TOWEL DISPOSABLE) ×2 IMPLANT
UNDERPAD 30X36 HEAVY ABSORB (UNDERPADS AND DIAPERS) ×2 IMPLANT

## 2021-04-09 NOTE — H&P (Signed)
H&P ? ?Chief Complaint: Prostate cancer ? ?History of Present Illness: 71 year old male with intermediate risk prostate cancer presents at this time for placement of SpaceOAR as well as I-125 brachytherapy treatment. ? ?Past Medical History:  ?Diagnosis Date  ? Asthma   ? CAP (community acquired pneumonia) 12/10/2018  ? COPD (chronic obstructive pulmonary disease) ([place])   ? COPD with acute exacerbation ([place]) 04/18/2018  ? CVA (cerebral vascular accident) [facility]) 2008  ? with temporary vision loss   ? Depression   ? ECHOCARDIOGRAM, ABNORMAL 03/06/2010  ? Qualifier: Diagnosis of  By: Purcell Nails, NP, Curt Bears    ? Elevated PSA 2014  ? no diagnosis pf prostate cancer in 07/2014  ? History of substance abuse ([place]) 01/14/2011  ? Marijuana  ? History of substance abuse ([place]) 01/14/2011  ? History of tobacco abuse 01/14/2011  ? Hyperlipidemia   ? Hypertension   ? Lobar pneumonia ([place]) 12/13/2018  ? Marijuana abuse   ? Nicotine addiction   ? Prediabetes 2014  ? ? ?Past Surgical History:  ?Procedure Laterality Date  ? COLONOSCOPY    ? ? ?Home Medications:  ? ? ?Allergies: No Known Allergies ? ?History reviewed. No pertinent family history. ? ?Social History:  reports that he quit smoking about 5 years ago. His smoking use included cigarettes. He has a 20.00 pack-year smoking history. He has never used smokeless tobacco. He reports that he does not currently use alcohol after a past usage of about 3.0 standard drinks per week. He reports that he does not use drugs. ? ?ROS: ?A complete review of systems was performed.  All systems are negative except for pertinent findings as noted. ? ?Physical Exam:  ?Vital signs in last 24 hours: ?BP (!) 187/87   Pulse (!) 124   Temp 99.1 ?F (37.3 ?C) (Oral)   Resp 18   SpO2 91%  ?Constitutional:  Alert and oriented, No acute distress ?Cardiovascular: Regular rate  ?Respiratory: Normal respiratory effort ?GI: Abdomen is soft, nontender, nondistended, no abdominal masses. No CVAT.  ?Genitourinary: Normal  male phallus, testes are descended bilaterally and non-tender and without masses, scrotum is normal in appearance without lesions or masses, perineum is normal on inspection. ?Lymphatic: No lymphadenopathy ?Neurologic: Grossly intact, no focal deficits ?Psychiatric: Normal mood and affect ? ?I have reviewed prior pt notes ? ?I have reviewed notes from referring/previous physicians ? ?I have reviewed urinalysis results ? ?I have independently reviewed prior imaging ? ?I have reviewed prior PSA results ? ?I have reviewed prior urine culture ? ? ?Impression/Assessment:  ?Prostate cancer ? ?Plan:  ?I-125 brachytherapy, placement of SpaceOAR ? ?

## 2021-04-09 NOTE — Discharge Instructions (Addendum)
Radioactive Seed Implant Home Care Instructions   Activity:    Rest for the remainder of the day.  Do not drive or operate equipment today.  You may resume normal  activities in a few days as instructed by your physician, without risk of harmful radiation exposure to those around you, provided you follow the time and distance precautions on the Radiation Oncology Instruction Sheet.   Meals: Drink plenty of lipuids and eat light foods, such as gelatin or soup this evening .  You may return to normal meal plan tomorrow.  Return To Work: You may return to work as instructed by your physician.  Special Instruction:   If any seeds are found, use tweezers to pick up seeds and place in a glass container of any kind and bring to your physician's office.  Call your physician if any of these symptoms occur:  Persistent or heavy bleeding Urine stream diminishes or stops completely after catheter is removed Fever equal to or greater than 101 degrees F Cloudy urine with a strong foul odor Severe pain  You may feel some burning pain and/or hesitancy when you urinate after the catheter is removed.  These symptoms may increase over the next few weeks, but should diminish within forur to six weeks.  Applying moist heat to the lower abdomen or a hot tub bath may help relieve the pain.  If the discomfort becomes severe, please call your physician for additional medications.   

## 2021-04-09 NOTE — Anesthesia Postprocedure Evaluation (Signed)
Anesthesia Post Note ? ?Patient: [name] ? ?Procedure(s) Performed: RADIOACTIVE SEED IMPLANT/BRACHYTHERAPY IMPLANT/64 SEEDS ?SPACE OAR INSTILLATION ? ?  ? ?Patient location during evaluation: Phase II ?Anesthesia Type: General ?Level of consciousness: awake ?Pain management: pain level controlled ?Vital Signs Assessment: post-procedure vital signs reviewed and stable ?Respiratory status: spontaneous breathing ?Cardiovascular status: stable ?Postop Assessment: no apparent nausea or vomiting ?Anesthetic complications: no ? ? ?No notable events documented. ? ?Last Vitals:  ?Vitals:  ? 04/09/21 1709 04/09/21 1710  ?BP:    ?Pulse: 79 88  ?Resp: 16 (!) 21  ?Temp:    ?SpO2: 95% 94%  ?  ?Last Pain:  ?Vitals:  ? 04/09/21 1700  ?TempSrc:   ?PainSc: 0-No pain  ? ? ?  ?  ?  ?  ?  ?  ? ?Huston Foley ? ? ? ? ?

## 2021-04-09 NOTE — Interval H&P Note (Signed)
History and Physical Interval Note: ? ?04/09/2021 ?12:22 PM ? ?[name]  has presented today for surgery, with the diagnosis of PROSTATE CANCER.  The various methods of treatment have been discussed with the patient and family. After consideration of risks, benefits and other options for treatment, the patient has consented to  Procedure(s) with comments: ?RADIOACTIVE SEED IMPLANT/BRACHYTHERAPY IMPLANT (N/A) - 90 MINS ?SPACE OAR INSTILLATION (N/A) as a surgical intervention.  The patient's history has been reviewed, patient examined, no change in status, stable for surgery.  I have reviewed the patient's chart and labs.  Questions were answered to the patient's satisfaction.   ? ? ?[name] [name] ? ? ?

## 2021-04-09 NOTE — Anesthesia Procedure Notes (Signed)
Procedure Name: LMA Insertion ?Date/Time: 04/09/2021 3:25 PM ?Performed by: Gerald Leitz, CRNA ?Pre-anesthesia Checklist: Patient identified, Patient being monitored, Timeout performed, Emergency Drugs available and Suction available ?Patient Re-evaluated:Patient Re-evaluated prior to induction ?Oxygen Delivery Method: Circle system utilized ?Preoxygenation: Pre-oxygenation with 100% oxygen ?Induction Type: IV induction ?Ventilation: Mask ventilation without difficulty ?LMA: LMA inserted ?LMA Size: 4.0 ?Tube type: Oral ?Number of attempts: 1 ?Placement Confirmation: positive ETCO2 and breath sounds checked- equal and bilateral ?Tube secured with: Tape ?Dental Injury: Teeth and Oropharynx as per pre-operative assessment  ? ? ? ? ?

## 2021-04-09 NOTE — Transfer of Care (Signed)
Immediate Anesthesia Transfer of Care Note ? ?Patient: [name] ? ?Procedure(s) Performed: Procedure(s) with comments: ?RADIOACTIVE SEED IMPLANT/BRACHYTHERAPY IMPLANT (N/A) - 90 MINS ?SPACE OAR INSTILLATION (N/A) ? ?Patient Location: PACU ? ?Anesthesia Type:General ? ?Level of Consciousness: Alert, Awake, Oriented ? ?Airway & Oxygen Therapy: Patient Spontanous Breathing ? ?Post-op Assessment: Report given to RN ? ?Post vital signs: Reviewed and stable ? ?Last Vitals:  ?Vitals:  ? 04/09/21 1100  ?BP: (!) 187/87  ?Pulse: (!) 124  ?Resp: 18  ?Temp: 37.3 ?C  ?SpO2: 91%  ? ? ?Complications: No apparent anesthesia complications ? ?

## 2021-04-09 NOTE — Op Note (Signed)
Preoperative diagnosis: Clinical stage TI C adenocarcinoma the prostate  ? ?Postoperative diagnosis: Same  ? ?Procedure: I-125 prostate seed implantation, SpaceOAR placement, flexible cystoscopy ? ?Surgeon: [name]. Meer Reindl M.D. ? ?Radiation Oncologist: Tyler Pita, M.D. ? ?Anesthesia: Gen.  ? ?Indications: Patient  was diagnosed with clinical stage TI C prostate cancer. We had extensive discussion with him about treatment options versus. He elected to proceed with seed implantation. He underwent consultation my office as well as with Dr. Tammi Klippel. He appeared to understand the advantages disadvantages potential risks of this treatment option. Full informed consent has been obtained.  ? ?Technique and findings: Patient was brought the operating room where he had successful induction of general anesthesia. He was placed in dorso-lithotomy position and prepped and draped in usual manner. Appropriate surgical timeout was performed. Radiation oncology department placed a transrectal ultrasound probe anchoring stand. Foley catheter with contrast in the balloon was inserted without difficulty. Anchoring needles were placed within the prostate. Rectal tube was placed. Real-time contouring of the urethra prostate and rectum were performed and the dosing parameters were established. Targeted dose was 145 gray. ? ?I was then called  to the operating suite suite for placement of the needles. A second timeout was performed. All needle passage was done with real-time transrectal ultrasound guidance with the sagittal plane. A total of  19needles were placed.  65 active seeds were implanted.  ?I then proceeded with placement of SpaceOAR by introducing a needle with the bevel angled inferiorly approximately 2 cm superior to the anus. This was angled downward and under direct ultrasound was placed within the space between the prostatic capsule and rectum. This was confirmed with a small amount of sterile saline injected and  this was performed under direct ultrasound. I then attached the SpaceOAR to the needle and injected this in the space between the prostate and rectum with good placement noted. The Foley catheter was removed and flexible cystoscopy failed to show any seeds outside the prostate.  The patient was brought to recovery room in stable condition, having tolerated the procedure well.. Dr Tammi Klippel was present for all aspects of the procedure.  ?  ?

## 2021-04-10 ENCOUNTER — Encounter (HOSPITAL_COMMUNITY): Payer: Self-pay | Admitting: Urology

## 2021-04-10 ENCOUNTER — Other Ambulatory Visit: Payer: Self-pay

## 2021-04-10 DIAGNOSIS — C61 Malignant neoplasm of prostate: Secondary | ICD-10-CM | POA: Diagnosis not present

## 2021-04-10 DIAGNOSIS — Z87891 Personal history of nicotine dependence: Secondary | ICD-10-CM | POA: Diagnosis not present

## 2021-04-10 DIAGNOSIS — I10 Essential (primary) hypertension: Secondary | ICD-10-CM | POA: Diagnosis not present

## 2021-04-10 DIAGNOSIS — J45909 Unspecified asthma, uncomplicated: Secondary | ICD-10-CM | POA: Diagnosis not present

## 2021-04-10 DIAGNOSIS — J449 Chronic obstructive pulmonary disease, unspecified: Secondary | ICD-10-CM | POA: Diagnosis not present

## 2021-04-10 DIAGNOSIS — R7303 Prediabetes: Secondary | ICD-10-CM | POA: Diagnosis not present

## 2021-04-10 DIAGNOSIS — Z8673 Personal history of transient ischemic attack (TIA), and cerebral infarction without residual deficits: Secondary | ICD-10-CM | POA: Diagnosis not present

## 2021-04-10 LAB — GLUCOSE, CAPILLARY
Glucose-Capillary: 147 mg/dL — ABNORMAL HIGH (ref 70–99)
Glucose-Capillary: 111 mg/dL — ABNORMAL HIGH (ref 70–99)

## 2021-04-10 NOTE — Progress Notes (Signed)
?  Radiation Oncology         [phone] ?________________________________ ? ?Name: [name] [name]: [id number]  ?Date: 04/10/2021  DOB: 1950-06-09 ? ?     Prostate Seed Implant ? ?[id number], Norwood Levo, MD  No ref. provider found ? ?DIAGNOSIS:  71 y.o. gentleman with Stage T1c adenocarcinoma of the prostate with Gleason score of 3+4, and PSA of 9.5. ? ?Oncology History  ?Malignant neoplasm of prostate ([place])  ?09/19/2020 Cancer Staging  ? Staging form: Prostate, AJCC 8th Edition ?- Clinical stage from 09/19/2020: Stage IIB (cT1c, cN0, cM0, PSA: 9.5, Grade Group: 2) - Signed by Ian Caldron, PA-C on 02/13/2021 ?Histopathologic type: Adenocarcinoma, NOS ?Stage prefix: Initial diagnosis ?Prostate specific antigen (PSA) range: Less than 10 ?Gleason primary pattern: 3 ?Gleason secondary pattern: 4 ?Gleason score: 7 ?Histologic grading system: 5 grade system ?Number of biopsy cores examined: 12 ?Number of biopsy cores positive: 1 ?Location of positive needle core biopsies: One side ? ?  ?02/13/2021 Initial Diagnosis  ? Malignant neoplasm of prostate ([place]) ?  ? ? ?  ICD-10-CM   ?1. Essential hypertension  I10 CANCELED: Basic metabolic panel per protocol  ?  CANCELED: CBC per protocol  ?  ? ? ?PROCEDURE: Insertion of radioactive I-125 seeds into the prostate gland. ? ?RADIATION DOSE: 145 Gy, definitive therapy. ? ?TECHNIQUE: [name] was brought to the operating room with the urologist. He was placed in the dorsolithotomy position. He was catheterized and a rectal tube was inserted. The perineum was shaved, prepped and draped. The ultrasound probe was then introduced into the rectum to see the prostate gland. ? ?TREATMENT DEVICE: A needle grid was attached to the ultrasound probe stand and anchor needles were placed. ? ?3D PLANNING: The prostate was imaged in 3D using a sagittal sweep of the prostate probe. These images were transferred to the planning computer. There, the prostate, urethra and rectum were defined on  each axial reconstructed image. Then, the software created an optimized 3D plan and a few seed positions were adjusted. The quality of the plan was reviewed using [facility] information for the target and the following two organs at risk:  Urethra and Rectum.  Then the accepted plan was printed and handed off to the radiation therapist.  Under my supervision, the custom loading of the seeds and spacers was carried out and loaded into sealed vicryl sleeves.  These pre-loaded needles were then placed into the needle holder.. ? ?PROSTATE VOLUME STUDY:  Using transrectal ultrasound the volume of the prostate was verified to be 38.9966 cc. ? ?SPECIAL TREATMENT PROCEDURE/SUPERVISION AND HANDLING: The pre-loaded needles were then delivered under sagittal guidance. A total of 19 needles were used to deposit 65 seeds in the prostate gland. The individual seed activity was 0.413 mCi. ? ?SpaceOAR:  Yes ? ?COMPLEX SIMULATION: At the end of the procedure, an anterior radiograph of the pelvis was obtained to document seed positioning and count. Cystoscopy was performed to check the urethra and bladder. ? ?MICRODOSIMETRY: At the end of the procedure, the patient was emitting 0.22 mR/hr at 1 meter. Accordingly, he was considered safe for hospital discharge. ? ?PLAN: The patient will return to the radiation oncology clinic for post implant CT dosimetry in three weeks. ? ? ?________________________________ ? ?[name], M.D. ? ? ? ?

## 2021-04-12 NOTE — Discharge Summary (Signed)
Patient ID: ?[name] ?[name]: [id number] ?DOB/AGE: 1950/11/04 71 y.o. ? ?Admit date: 04/09/2021 ?Discharge date: 04/12/2021 ? ?Primary Care Physician:  Fayrene Helper, MD ? ?Discharge Diagnoses: Prostate cancer ?Present on Admission: ? Prostate cancer ([place]) ? ? ? ? ?Discharge Medications: ?Allergies as of 04/10/2021   ?No Known Allergies ?  ? ?  ?Medication List  ?  ? ?TAKE these medications   ? ?albuterol (2.5 MG/3ML) 0.083% nebulizer solution ?Commonly known as: PROVENTIL ?INHALE THE CONTENTS OF 1 VIAL VIA NEBULIZER EVERY 6 (SIX) HOURS AS NEEDED FOR WHEEZING OR SHORTNESS OF BREATH. ?  ?albuterol 108 (90 Base) MCG/ACT inhaler ?Commonly known as: VENTOLIN HFA ?INHALE 2 PUFFS INTO THE LUNGS EVERY FOUR HOURS AS NEEDED. ONLY IF YOU CAN'T CATCH YOUR BREATH. ?  ?amLODipine 5 MG tablet ?Commonly known as: NORVASC ?Take 1 tablet (5 mg total) by mouth daily. ?  ?aspirin EC 81 MG tablet ?Take 81 mg by mouth daily. Swallow whole. ?  ?Breztri Aerosphere 160-9-4.8 MCG/ACT Aero ?Generic drug: Budeson-Glycopyrrol-Formoterol ?INHALE (2) PUFFS INTO THE LUNGS IN THE MORNING AND AT BEDTIME ?  ?Breztri Aerosphere 160-9-4.8 MCG/ACT Aero ?Generic drug: Budeson-Glycopyrrol-Formoterol ?Inhale 2 puffs into the lungs in the morning and at bedtime. ?  ?fluticasone 50 MCG/ACT nasal spray ?Commonly known as: FLONASE ?Place 1 spray into both nostrils daily as needed for allergies. ?  ?ibuprofen 200 MG tablet ?Commonly known as: ADVIL ?Take 200 mg by mouth every 6 (six) hours as needed for moderate pain. ?  ?metFORMIN 500 MG tablet ?Commonly known as: GLUCOPHAGE ?TAKE 1 TABLET TWICE DAILY WITH MEALS ?  ?pravastatin 40 MG tablet ?Commonly known as: PRAVACHOL ?TAKE 1 TABLET AT BEDTIME ?  ?predniSONE 5 MG tablet ?Commonly known as: DELTASONE ?TAKE 1 TABLET (5 MG TOTAL) BY MOUTH DAILY WITH BREAKFAST. ?  ?triamterene-hydrochlorothiazide 37.5-25 MG tablet ?Commonly known as: MAXZIDE-25 ?TAKE 1 AND 1/2 TABLETS EVERY DAY ?  ? ?  ? ? ? ?Significant  Diagnostic Studies:  ?DG C-Arm 1-60 Min-No Report ? ?Result Date: 04/09/2021 ?Fluoroscopy was utilized by the requesting physician.  No radiographic interpretation.  ? ? ?Brief H and P: ?For complete details please refer to admission H and P, but in brief the patient has interm ?Hospital Course: The patient tolerated his brachytherapy procedure well.  Due to inability to get home, and no ride home following this typically outpatient procedure as well as the patient's pulmonary condition, he was kept overnight for observation and discharged on postoperative day #1. ?Principal Problem: ?  Prostate cancer ([place]) ? ? ?Day of Discharge ?BP (!) 147/85 (BP Location: Left Arm)   Pulse 88   Temp 98 ?F (36.7 ?C) (Oral)   Resp 20   SpO2 100%  ? ?No results found for this or any previous visit (from the past 24 hour(s)). ? ?Physical Exam: ?General: Alert and awake oriented x3 not in any acute distress. ?HEENT: anicteric sclera, pupils reactive to light and accommodation ?CVS: S1-S2 clear no murmur rubs or gallops ?Chest: clear to auscultation bilaterally, no wheezing rales or rhonchi ?Abdomen: soft nontender, nondistended, normal bowel sounds, no organomegaly ?Extremities: no cyanosis, clubbing or edema noted bilaterally ?Neuro: Cranial nerves II-XII intact, no focal neurological deficits ? ?Disposition: Home ? ?Diet: Regular ? ?Activity: No restrictions ? ? ?TESTS THAT NEED FOLLOW-UP ? ?N/A ? ?DISCHARGE FOLLOW-UP ? ? Follow-up Information   ? ? [name], MD Follow up.   ?Specialty: Urology ?Why: July 11 ?Contact information: ?[address] ?[address] ?[place]  [id number] ?[phone] ? ? ?  ?  ? ?  ?  ? ?  ? ? ?Time spent on Discharge: ? ?5 minutes ? ?Signed: ?[name] Trejon Duford ?04/12/2021, 11:14 AM ? ? ?  ?

## 2021-04-16 ENCOUNTER — Telehealth: Payer: Self-pay | Admitting: Family Medicine

## 2021-04-16 ENCOUNTER — Other Ambulatory Visit: Payer: Self-pay

## 2021-04-16 ENCOUNTER — Other Ambulatory Visit: Payer: Self-pay | Admitting: *Deleted

## 2021-04-16 ENCOUNTER — Telehealth: Payer: Self-pay | Admitting: Pulmonary Disease

## 2021-04-16 ENCOUNTER — Other Ambulatory Visit: Payer: Self-pay | Admitting: Family Medicine

## 2021-04-16 MED ORDER — ALBUTEROL SULFATE (2.5 MG/3ML) 0.083% IN NEBU: 2.5000 mg | INHALATION_SOLUTION | Freq: Four times a day (QID) | RESPIRATORY_TRACT | 6 refills | Status: DC | PRN

## 2021-04-16 NOTE — Telephone Encounter (Signed)
Refill sent in. Nothing further needed 

## 2021-04-16 NOTE — Telephone Encounter (Signed)
Contacted pulmonary  ?

## 2021-04-16 NOTE — Telephone Encounter (Signed)
Patient came by office requesting refill on  ? ?albuterol (PROVENTIL) (2.5 MG/3ML) 0.083% nebulizer solution  ?

## 2021-04-16 NOTE — Telephone Encounter (Signed)
This medication is refill by pulmonary please have him call their office  ?

## 2021-05-03 ENCOUNTER — Telehealth: Payer: Self-pay | Admitting: *Deleted

## 2021-05-03 NOTE — Telephone Encounter (Signed)
Called patient to remind of post seed appts. for 05-04-21- arrival time- 8:45 am @ [facility], spoke with patient and he is aware of these appts. ?

## 2021-05-04 ENCOUNTER — Encounter: Payer: Self-pay | Admitting: Urology

## 2021-05-04 ENCOUNTER — Other Ambulatory Visit: Payer: Self-pay

## 2021-05-04 ENCOUNTER — Ambulatory Visit
Admission: RE | Admit: 2021-05-04 | Discharge: 2021-05-04 | Disposition: A | Discharge status: Home / Self Care | Payer: Medicare HMO | Source: Ambulatory Visit | Attending: Urology | Admitting: Urology

## 2021-05-04 ENCOUNTER — Other Ambulatory Visit: Payer: Self-pay | Admitting: Urology

## 2021-05-04 ENCOUNTER — Ambulatory Visit
Admission: RE | Admit: 2021-05-04 | Discharge: 2021-05-04 | Disposition: A | Discharge status: Home / Self Care | Payer: Medicare HMO | Source: Ambulatory Visit | Attending: Radiation Oncology | Admitting: Radiation Oncology

## 2021-05-04 VITALS — BP 149/59 | HR 114 | Temp 97.3°F | Resp 18 | Ht 67.0 in | Wt 126.2 lb

## 2021-05-04 DIAGNOSIS — C61 Malignant neoplasm of prostate: Secondary | ICD-10-CM | POA: Diagnosis not present

## 2021-05-04 NOTE — Progress Notes (Addendum)
Post-Seed appointment. I verified patient identity and began nursing interview. Patient reports urinary and prostate discomfort 2/10. No other issues reported at this timer. ? ?Meaningful use complete. ?I-PSS score of 25 (severe). ?No urinary management mediations at this time. ?Urology appointment- None -per [facility] ? ?BP (!) 149/59 (BP Location: Left Arm, Patient Position: Sitting, Cuff Size: Normal)   Pulse (!) 114   Temp (!) 97.3 ?F (36.3 ?C) (Temporal)   Resp 18   Ht '5\' 7"'$  (1.702 m)   Wt 126 lb 4 oz (57.3 kg)   SpO2 96%   BMI 19.77 kg/m?  ? ?

## 2021-05-04 NOTE — Progress Notes (Signed)
?Radiation Oncology         [phone]) [phone] ?________________________________ ? ?Name: [name] [name]: [id number]  ?Date: 05/04/2021  DOB: 1950/09/24 ? ?Post-Seed Follow-Up Visit Note ? ?CC: Fayrene Helper, MD  Fayrene Helper, MD ? ?Diagnosis:   71 y.o. gentleman with Stage T1c adenocarcinoma of the prostate with Gleason score of 3+4, and PSA of 9.5. ? ?  ICD-10-CM   ?1. Malignant neoplasm of prostate ([place])  C61   ?  ? ? ?Interval Since Last Radiation:  3.5 weeks ?04/09/21:  Insertion of radioactive I-125 seeds into the prostate gland; 145 Gy, definitive therapy with placement of SpaceOAR gel. ? ?Narrative:  The patient returns today for routine follow-up.  He is complaining of increased urinary frequency and urinary hesitation symptoms. He filled out a questionnaire regarding urinary function today providing and overall IPSS score of 25 characterizing his symptoms as severe with weak stream, frequency, urgency and nocturia x3.  He denies dysuria, gross hematuria, straining to void or incontinence.  His pre-implant score was 1. He denies any abdominal pain or bowel symptoms aside from more frequent, small-volume bowel movements daily.  He specifically denies nausea, vomiting, diarrhea or constipation.  He reports a healthy appetite and is maintaining his weight.  He has not noticed any significant impact on his energy level and overall, is quite pleased with his progress to date. ? ?ALLERGIES:  has No Known Allergies. ? ?Meds: ?Current Outpatient Medications  ?Medication Sig Dispense Refill  ? albuterol (PROVENTIL) (2.5 MG/3ML) 0.083% nebulizer solution Take 3 mLs (2.5 mg total) by nebulization every 6 (six) hours as needed for wheezing or shortness of breath. 150 mL 6  ? albuterol (VENTOLIN HFA) 108 (90 Base) MCG/ACT inhaler INHALE 2 PUFFS INTO THE LUNGS EVERY FOUR HOURS AS NEEDED. ONLY IF YOU CAN'T CATCH YOUR BREATH. 8.5 g 5  ? amLODipine (NORVASC) 5 MG tablet Take 1 tablet (5 mg total) by mouth daily. 90  tablet 1  ? aspirin EC 81 MG tablet Take 81 mg by mouth daily. Swallow whole.    ? Budeson-Glycopyrrol-Formoterol (BREZTRI AEROSPHERE) 160-9-4.8 MCG/ACT AERO INHALE (2) PUFFS INTO THE LUNGS IN THE MORNING AND AT BEDTIME 10.7 g 11  ? Budeson-Glycopyrrol-Formoterol (BREZTRI AEROSPHERE) 160-9-4.8 MCG/ACT AERO Inhale 2 puffs into the lungs in the morning and at bedtime. 10.7 g 0  ? fluticasone (FLONASE) 50 MCG/ACT nasal spray Place 1 spray into both nostrils daily as needed for allergies.    ? ibuprofen (ADVIL) 200 MG tablet Take 200 mg by mouth every 6 (six) hours as needed for moderate pain.    ? metFORMIN (GLUCOPHAGE) 500 MG tablet TAKE 1 TABLET TWICE DAILY WITH MEALS 180 tablet 1  ? pravastatin (PRAVACHOL) 40 MG tablet TAKE 1 TABLET AT BEDTIME 90 tablet 1  ? predniSONE (DELTASONE) 5 MG tablet TAKE 1 TABLET (5 MG TOTAL) BY MOUTH DAILY WITH BREAKFAST. 90 tablet 2  ? triamterene-hydrochlorothiazide (MAXZIDE-25) 37.5-25 MG tablet TAKE 1 AND 1/2 TABLETS EVERY DAY 135 tablet 1  ? ?No current facility-administered medications for this visit.  ? ? ?Physical Findings: ?In general this is a well appearing African American male in no acute distress. He's alert and oriented x4 and appropriate throughout the examination. Cardiopulmonary assessment is negative for acute distress and he exhibits normal effort.  ? ?Lab Findings: ?Lab Results  ?Component Value Date  ? WBC 7.5 04/05/2021  ? HGB 14.1 04/05/2021  ? HCT 44.1 04/05/2021  ? MCV 95.5 04/05/2021  ? PLT 241 04/05/2021  ? ? ?  Radiographic Findings:  ?Patient underwent CT imaging in our clinic for post implant dosimetry. The CT will be reviewed by Dr. Tammi Klippel to confirm there is an adequate distribution of radioactive seeds throughout the prostate gland and ensure that there are no seeds in or near the rectum.  We suspect the final radiation plan and dosimetry will show appropriate coverage of the prostate gland. He understands that we will call and inform him of any unexpected  findings on further review of his imaging and dosimetry. ? ?Impression/Plan: 71 y.o. gentleman with Stage T1c adenocarcinoma of the prostate with Gleason score of 3+4, and PSA of 9.5. ?The patient is recovering from the effects of radiation. His urinary symptoms should gradually improve over the next 4-6 months. We talked about this today and currently, he feels like he is tolerating the LUTS well and does not wish to try an alpha-blocker as offered. He is encouraged by his improvement already and is otherwise pleased with his outcome. We also talked about long-term follow-up for prostate cancer following seed implant. He understands that ongoing PSA determinations and digital rectal exams will help perform surveillance to rule out disease recurrence. He has a scheduled follow up appointment with Dr. Diona Fanti on 07/17/2021. He understands what to expect with his PSA measures. Patient was also educated today about some of the long-term effects from radiation including a small risk for rectal bleeding and possibly erectile dysfunction. We talked about some of the general management approaches to these potential complications. However, I did encourage the patient to contact our office or return at any point if he has questions or concerns related to his previous radiation and prostate cancer. ? ? ? ?Nicholos Johns, PA-C ? ?

## 2021-05-04 NOTE — Progress Notes (Signed)
?  Radiation Oncology         ([phone] ?________________________________ ? ?Name: [name] [name]: [id number]  ?Date: 05/04/2021  DOB: 12/18/1950 ? ?COMPLEX SIMULATION NOTE ? ?NARRATIVE:  The patient was brought to the [facility] today following prostate seed implantation approximately one month ago.  Identity was confirmed.  All relevant records and images related to the planned course of therapy were reviewed.  Then, the patient was set-up supine.  CT images were obtained.  The CT images were loaded into the planning software.  Then the prostate and rectum were contoured.  Treatment planning then occurred.  The implanted iodine 125 seeds were identified by the physics staff for projection of radiation distribution  I have requested : 3D Simulation  I have requested a DVH of the following structures: Prostate and rectum.   ? ?________________________________ ? ?Sheral Apley Tammi Klippel, M.D. ? ?

## 2021-05-14 ENCOUNTER — Telehealth: Payer: Self-pay

## 2021-05-14 NOTE — Telephone Encounter (Signed)
Patient's friend Ms. Madelaine Etienne called the radiation dept. & left a voicemail in reference to patient's pain. I returned the call at [phone] and left a voicemail to return my call at [phone], so that I may be of some assistance to patient.  ?

## 2021-05-14 NOTE — Telephone Encounter (Signed)
Error - duplicate

## 2021-05-14 NOTE — Telephone Encounter (Signed)
Patient complained of dysuria and constipation. I reccommended AZO (no longer than 48hr use) and MiraLAX to help ease symptoms for now, but I also called [facility] [phone] and delivered this information to patient's nursing care team Lattie Haw), who states "They will reach out to the patient." I verified my understanding and ended the call. ?

## 2021-05-15 ENCOUNTER — Ambulatory Visit
Admission: RE | Admit: 2021-05-15 | Discharge: 2021-05-15 | Disposition: A | Discharge status: Home / Self Care | Payer: Medicare HMO | Source: Ambulatory Visit | Attending: Radiation Oncology | Admitting: Radiation Oncology

## 2021-05-15 ENCOUNTER — Encounter: Payer: Self-pay | Admitting: Radiation Oncology

## 2021-05-15 DIAGNOSIS — C61 Malignant neoplasm of prostate: Secondary | ICD-10-CM | POA: Insufficient documentation

## 2021-05-15 DIAGNOSIS — Z191 Hormone sensitive malignancy status: Secondary | ICD-10-CM | POA: Diagnosis not present

## 2021-05-18 ENCOUNTER — Other Ambulatory Visit: Payer: Self-pay

## 2021-05-18 ENCOUNTER — Encounter (HOSPITAL_COMMUNITY): Payer: Self-pay

## 2021-05-18 ENCOUNTER — Emergency Department (HOSPITAL_COMMUNITY)
Admission: EM | Admit: 2021-05-18 | Discharge: 2021-05-18 | Disposition: A | Discharge status: Home / Self Care | Payer: Medicare HMO | Attending: Emergency Medicine | Admitting: Emergency Medicine

## 2021-05-18 ENCOUNTER — Telehealth: Payer: Self-pay

## 2021-05-18 DIAGNOSIS — Z79899 Other long term (current) drug therapy: Secondary | ICD-10-CM | POA: Insufficient documentation

## 2021-05-18 DIAGNOSIS — Z7982 Long term (current) use of aspirin: Secondary | ICD-10-CM | POA: Diagnosis not present

## 2021-05-18 DIAGNOSIS — R062 Wheezing: Secondary | ICD-10-CM | POA: Insufficient documentation

## 2021-05-18 DIAGNOSIS — Z7951 Long term (current) use of inhaled steroids: Secondary | ICD-10-CM | POA: Insufficient documentation

## 2021-05-18 DIAGNOSIS — J449 Chronic obstructive pulmonary disease, unspecified: Secondary | ICD-10-CM | POA: Insufficient documentation

## 2021-05-18 DIAGNOSIS — K625 Hemorrhage of anus and rectum: Secondary | ICD-10-CM | POA: Diagnosis not present

## 2021-05-18 DIAGNOSIS — I10 Essential (primary) hypertension: Secondary | ICD-10-CM | POA: Insufficient documentation

## 2021-05-18 DIAGNOSIS — Z8546 Personal history of malignant neoplasm of prostate: Secondary | ICD-10-CM | POA: Diagnosis not present

## 2021-05-18 LAB — URINALYSIS, ROUTINE W REFLEX MICROSCOPIC
Bacteria, UA: NONE SEEN
Bilirubin Urine: NEGATIVE
Glucose, UA: NEGATIVE mg/dL
Ketones, ur: NEGATIVE mg/dL
Nitrite: NEGATIVE
Protein, ur: NEGATIVE mg/dL
Specific Gravity, Urine: 1.018 (ref 1.005–1.030)
pH: 5 (ref 5.0–8.0)

## 2021-05-18 LAB — TYPE AND SCREEN
ABO/RH(D): O POS
Antibody Screen: NEGATIVE

## 2021-05-18 LAB — CBC WITH DIFFERENTIAL/PLATELET
Abs Immature Granulocytes: 0.02 10*3/uL (ref 0.00–0.07)
Basophils Absolute: 0 10*3/uL (ref 0.0–0.1)
Basophils Relative: 0 %
Eosinophils Absolute: 0 10*3/uL (ref 0.0–0.5)
Eosinophils Relative: 0 %
HCT: 39.7 % (ref 39.0–52.0)
Hemoglobin: 12.5 g/dL — ABNORMAL LOW (ref 13.0–17.0)
Immature Granulocytes: 0 %
Lymphocytes Relative: 10 %
Lymphs Abs: 0.7 10*3/uL (ref 0.7–4.0)
MCH: 30.1 pg (ref 26.0–34.0)
MCHC: 31.5 g/dL (ref 30.0–36.0)
MCV: 95.7 fL (ref 80.0–100.0)
Monocytes Absolute: 0.4 10*3/uL (ref 0.1–1.0)
Monocytes Relative: 6 %
Neutro Abs: 5.9 10*3/uL (ref 1.7–7.7)
Neutrophils Relative %: 84 %
Platelets: 286 10*3/uL (ref 150–400)
RBC: 4.15 MIL/uL — ABNORMAL LOW (ref 4.22–5.81)
RDW: 12.2 % (ref 11.5–15.5)
WBC: 7 10*3/uL (ref 4.0–10.5)
nRBC: 0 % (ref 0.0–0.2)

## 2021-05-18 LAB — COMPREHENSIVE METABOLIC PANEL
ALT: 15 U/L (ref 0–44)
AST: 17 U/L (ref 15–41)
Albumin: 4 g/dL (ref 3.5–5.0)
Alkaline Phosphatase: 94 U/L (ref 38–126)
Anion gap: 10 (ref 5–15)
BUN: 14 mg/dL (ref 8–23)
CO2: 31 mmol/L (ref 22–32)
Calcium: 9.7 mg/dL (ref 8.9–10.3)
Chloride: 98 mmol/L (ref 98–111)
Creatinine, Ser: 0.96 mg/dL (ref 0.61–1.24)
GFR, Estimated: >60 mL/min (ref >60)
Glucose, Bld: 113 mg/dL — ABNORMAL HIGH (ref 70–99)
Potassium: 3.9 mmol/L (ref 3.5–5.1)
Sodium: 139 mmol/L (ref 135–145)
Total Bilirubin: 0.5 mg/dL (ref 0.3–1.2)
Total Protein: 7.6 g/dL (ref 6.5–8.1)

## 2021-05-18 LAB — LIPASE, BLOOD: Lipase: 48 U/L (ref 11–51)

## 2021-05-18 LAB — POC OCCULT BLOOD, ED: Fecal Occult Bld: POSITIVE — AB

## 2021-05-18 MED ORDER — AMOXICILLIN-POT CLAVULANATE 875-125 MG PO TABS: 1.0000 | ORAL_TABLET | Freq: Two times a day (BID) | ORAL | 0 refills | Status: DC

## 2021-05-18 MED ORDER — TAMSULOSIN HCL 0.4 MG PO CAPS: 0.4000 mg | ORAL_CAPSULE | Freq: Every day | ORAL | 0 refills | Status: DC

## 2021-05-18 MED ORDER — AMOXICILLIN-POT CLAVULANATE 875-125 MG PO TABS
1.0000 | ORAL_TABLET | Freq: Once | ORAL | Status: AC
  Administered 2021-05-18: 1 via ORAL
  Filled 2021-05-18: qty 1

## 2021-05-18 NOTE — Discharge Instructions (Signed)
You have an ulcer that was formed by some of the material they put in when they put the seeds.  You will start on an antibiotic and a medication to help you urinate more freely.  This should help clear it up.  In the meantime hold your aspirin for the next 3 days and then you can restart it.  If you start having pain all the time, fever, inability to urinate you should return to the emergency room. ?

## 2021-05-18 NOTE — ED Provider Triage Note (Signed)
Emergency Medicine Provider Triage Evaluation Note ? ?[name] , a 71 y.o. male  was evaluated in triage.  Pt complains of rectal bleeding.  Patient states that he had a bowel movement last night and noticed blood in the toilet.  He said that it was dark red.  He did not have black tarry stools.  He has not had a bowel movement since then so is unsure if it is still going on.  He says he has associated pressure in his lower abdomen.  He feels like he has to pee but he is not able to go.  Denies any dysuria or hematuria.  Denies any flank pain or abdominal pain.  Denies nausea or vomiting.  No fevers.  Denies any other symptoms. Not on blood thinners.  ?He has a history of prostate cancer and was sent here by his oncologist to be evaluated. ? ?Review of Systems  ?Positive: Rectal bleeding, suprapubic pressure ?Negative:  ? ?Physical Exam  ?BP 138/71 (BP Location: Right Arm)   Pulse 82   Temp 99.1 ?F (37.3 ?C)   Resp 20   SpO2 95%  ?Gen:   Awake, no distress   ?Resp:  Normal effort  ?MSK:   Moves extremities without difficulty  ?Other:  Abdomen soft, nontender.  ? ?Medical Decision Making  ?Medically screening exam initiated at 1:43 PM.  Appropriate orders placed.  [name] was informed that the remainder of the evaluation will be completed by another provider, this initial triage assessment does not replace that evaluation, and the importance of remaining in the ED until their evaluation is complete. ? ? ?  ?Adolphus Birchwood, PA-C ?05/18/21 1345 ? ?

## 2021-05-18 NOTE — ED Provider Notes (Signed)
?[facility] DEPT ?Provider Note ? ? ?CSN: [id number] ?Arrival date & time: 05/18/21  1316 ? ?  ? ?History ? ?Chief Complaint  ?Patient presents with  ? Rectal Bleeding  ? ? ?TITAN KARNER is a 71 y.o. male. ? ?Patient is a 71 year old male with a history of COPD, hypertension, hyperlipidemia, CVA, prostate cancer with recent radioactive seeds placed approximately 5 weeks ago who is coming in with complaint of rectal bleeding.  Patient reports that every time he goes to urinate he has blood that comes out of his rectum.  He is sitting to urinate which she was told to do after having the seeds placed and he reports he has to put pressure underneath his penis to get the urine to come out and when he does that he gets a pain in his rectum and bright red blood comes out.  This is happening 5-6 times per day.  He reports this has been going on all week but is not getting significantly worse.  He denies any blood in his urine, fever or abdominal pain.  As long as he is not urinating he does not have any pain.  He does not know if his stool has been normal in color as he has not paid attention.  He denies any prior history of rectal bleeding.  Patient does not take anticoagulation.  Patient reports that he had a colonoscopy years ago and as far as he knows it was normal.  Based on records the last documented colonoscopy was in 2008 at that time he had diverticulosis. ? ?The history is provided by the patient, a friend and medical records.  ?Rectal Bleeding ? ?  ? ?Home Medications ?Prior to Admission medications   ?Medication Sig Start Date End Date Taking? Authorizing Provider  ?amoxicillin-clavulanate (AUGMENTIN) 875-125 MG tablet Take 1 tablet by mouth every 12 (twelve) hours. 05/18/21  Yes Blanchie Dessert, MD  ?tamsulosin (FLOMAX) 0.4 MG CAPS capsule Take 1 capsule (0.4 mg total) by mouth daily after supper. 05/18/21  Yes Blanchie Dessert, MD  ?albuterol (PROVENTIL) (2.5 MG/3ML) 0.083%  nebulizer solution Take 3 mLs (2.5 mg total) by nebulization every 6 (six) hours as needed for wheezing or shortness of breath. 04/16/21   Chesley Mires, MD  ?albuterol (VENTOLIN HFA) 108 (90 Base) MCG/ACT inhaler INHALE 2 PUFFS INTO THE LUNGS EVERY FOUR HOURS AS NEEDED. ONLY IF YOU CAN'T CATCH YOUR BREATH. 03/28/21   Chesley Mires, MD  ?amLODipine (NORVASC) 5 MG tablet Take 1 tablet (5 mg total) by mouth daily. 11/16/20   Fayrene Helper, MD  ?aspirin EC 81 MG tablet Take 81 mg by mouth daily. Swallow whole.    [provider]  ?Budeson-Glycopyrrol-Formoterol (BREZTRI AEROSPHERE) 160-9-4.8 MCG/ACT AERO INHALE (2) PUFFS INTO THE LUNGS IN THE MORNING AND AT BEDTIME 04/04/21   Chesley Mires, MD  ?Budeson-Glycopyrrol-Formoterol (BREZTRI AEROSPHERE) 160-9-4.8 MCG/ACT AERO Inhale 2 puffs into the lungs in the morning and at bedtime. 04/04/21   Chesley Mires, MD  ?fluticasone (FLONASE) 50 MCG/ACT nasal spray Place 1 spray into both nostrils daily as needed for allergies.    [provider]  ?ibuprofen (ADVIL) 200 MG tablet Take 200 mg by mouth every 6 (six) hours as needed for moderate pain.    [provider]  ?metFORMIN (GLUCOPHAGE) 500 MG tablet TAKE 1 TABLET TWICE DAILY WITH MEALS 11/22/20   Lindell Spar, MD  ?pravastatin (PRAVACHOL) 40 MG tablet TAKE 1 TABLET AT BEDTIME 11/16/20   Fayrene Helper,  MD  ?predniSONE (DELTASONE) 5 MG tablet TAKE 1 TABLET (5 MG TOTAL) BY MOUTH DAILY WITH BREAKFAST. 02/07/21   Chesley Mires, MD  ?triamterene-hydrochlorothiazide (MAXZIDE-25) 37.5-25 MG tablet TAKE 1 AND 1/2 TABLETS EVERY DAY 11/16/20   Fayrene Helper, MD  ?simvastatin (ZOCOR) 40 MG tablet Take 40 mg by mouth daily. 10/02/10 03/18/11  Fayrene Helper, MD  ?   ? ?Allergies    ?Patient has no known allergies.   ? ?Review of Systems   ?Review of Systems  ?Gastrointestinal:  Positive for hematochezia.  ? ?Physical Exam ?Updated Vital Signs ?BP 130/80   Pulse 80   Temp 99.1 ?F (37.3 ?C)   Resp  18   SpO2 96%  ?Physical Exam ?Vitals and nursing note reviewed.  ?Constitutional:   ?   General: He is not in acute distress. ?   Appearance: He is well-developed.  ?HENT:  ?   Head: Normocephalic and atraumatic.  ?Eyes:  ?   Conjunctiva/sclera: Conjunctivae normal.  ?   Pupils: Pupils are equal, round, and reactive to light.  ?Cardiovascular:  ?   Rate and Rhythm: Normal rate and regular rhythm.  ?   Heart sounds: No murmur heard. ?Pulmonary:  ?   Effort: Pulmonary effort is normal. No respiratory distress.  ?   Breath sounds: Wheezing present. No rales.  ?   Comments: Scant wheezing ?Abdominal:  ?   General: There is no distension.  ?   Palpations: Abdomen is soft.  ?   Tenderness: There is no abdominal tenderness. There is no guarding or rebound.  ?Genitourinary: ?   Penis: Normal.   ?   Testes: Normal.  ?   Comments: Patient has no external hemorrhoids present.  On digital rectal exam he does have tenderness of the prostate and faint blood-tinged exam finger but no active brisk bleeding or significant amount of blood present. ?Musculoskeletal:     ?   General: No tenderness. Normal range of motion.  ?   Cervical back: Normal range of motion and neck supple.  ?Skin: ?   General: Skin is warm and dry.  ?   Findings: No erythema or rash.  ?Neurological:  ?   Mental Status: He is alert and oriented to person, place, and time. Mental status is at baseline.  ?Psychiatric:     ?   Mood and Affect: Mood normal.     ?   Behavior: Behavior normal.  ? ? ?ED Results / Procedures / Treatments   ?Labs ?(all labs ordered are listed, but only abnormal results are displayed) ?Labs Reviewed  ?COMPREHENSIVE METABOLIC PANEL - Abnormal; Notable for the following components:  ?    Result Value  ? Glucose, Bld 113 (*)   ? All other components within normal limits  ?CBC WITH DIFFERENTIAL/PLATELET - Abnormal; Notable for the following components:  ? RBC 4.15 (*)   ? Hemoglobin 12.5 (*)   ? All other components within normal limits   ?URINALYSIS, ROUTINE W REFLEX MICROSCOPIC - Abnormal; Notable for the following components:  ? Hgb urine dipstick MODERATE (*)   ? Leukocytes,Ua TRACE (*)   ? All other components within normal limits  ?POC OCCULT BLOOD, ED - Abnormal; Notable for the following components:  ? Fecal Occult Bld POSITIVE (*)   ? All other components within normal limits  ?LIPASE, BLOOD  ?POC OCCULT BLOOD, ED  ?TYPE AND SCREEN  ? ? ?EKG ?None ? ?Radiology ?No results found. ? ?Procedures ?Procedures  ? ? ?  Medications Ordered in ED ?Medications  ?amoxicillin-clavulanate (AUGMENTIN) 875-125 MG per tablet 1 tablet (has no administration in time range)  ? ? ?ED Course/ Medical Decision Making/ A&P ?  ?                        ?Medical Decision Making ?Amount and/or Complexity of Data Reviewed ?External Data Reviewed: notes. ?   Details: from urology ?Labs: ordered. Decision-making details documented in ED Course. ? ?Risk ?Prescription drug management. ? ? ?Pt with multiple medical problems and comorbidities and presenting today with a complaint that caries a high risk for morbidity and mortality.  Today complaining of rectal bleeding.  He reports the blood only comes out when he is attempting to urinate.  He did recently had radioactive seeds placed but denies any prior history of rectal bleeding.  Patient has no abdominal pain today, vital signs are stable and he is well-appearing.  Concern for possible bleeding from the prostate due to recent seeds versus internal hemorrhoids versus diverticulosis.  Will discuss with urology if this is a common complication after having seeds placed or if he would benefit from imaging.  Patient does not take anticoagulation.  I patently interpreted patient's labs today and CBC with a hemoglobin of 12.5 from 14 approximately 1 month ago, CMP without acute findings, lipase within normal limits and UA is pending. ? ?5:44 PM ?Patient's UA without acute findings.  I discussed the case with Dr. Junious Silk with  urology and he reports this can commonly happen after having OAR spacer as it expands and can cause ulcers.  Patient is having no urinary retention as he was only holding 81 mL after urinating.  He is well-appea

## 2021-05-18 NOTE — ED Triage Notes (Signed)
Pt reports sudden onset of rectal bleeding last night. Pt also endorses pressure in abdomen when urinating. Pt has hx of prostate cancer. ?

## 2021-05-18 NOTE — Telephone Encounter (Signed)
Patient called and reports new onset of rectal hemorrhage. Patient states "He went to urinate and blood began to shoot from his rectum." I advised patient to be seen at the ER immediately. Patient replied "Any time today?" I replied "No, immediately and I will notify your treatment team here at [facility] (Dr. Tammi Klippel and Freeman Caldron PA-C). I will also call to check on you later today." Patient verbalized understanding of the conversation. ?

## 2021-05-22 ENCOUNTER — Telehealth: Payer: Self-pay | Admitting: *Deleted

## 2021-05-23 ENCOUNTER — Telehealth: Payer: Self-pay | Admitting: *Deleted

## 2021-05-23 NOTE — Telephone Encounter (Signed)
.  Called patient to schedule appointment per 5/16 inbasket, patient is aware of date and time.   ?

## 2021-05-29 ENCOUNTER — Telehealth: Payer: Self-pay | Admitting: *Deleted

## 2021-05-29 ENCOUNTER — Ambulatory Visit: Payer: Medicare HMO | Admitting: Urology

## 2021-05-29 ENCOUNTER — Telehealth: Payer: Medicare HMO

## 2021-05-29 NOTE — Telephone Encounter (Signed)
  Care Management   Follow Up Note   05/29/2021 Name: [name] [name]: [id number] DOB: February 16, 1950   Referred by: Fayrene Helper, MD Reason for referral : Chronic Care Management (DM2, COPD)   Third unsuccessful telephone outreach was attempted today. The patient was referred to the case management team for assistance with care management and care coordination. The patient's primary care provider has been notified of our unsuccessful attempts to make or maintain contact with the patient. The care management team is pleased to engage with this patient at any time in the future should he/she be interested in assistance from the care management team.   Follow Up Plan: Telephone follow up appointment with care management team member scheduled for: upon care guide rescheduling.  Jacqlyn Larsen [facility], BSN RN Case Manager [facility] [phone]

## 2021-06-13 ENCOUNTER — Ambulatory Visit (INDEPENDENT_AMBULATORY_CARE_PROVIDER_SITE_OTHER): Payer: Medicare HMO | Admitting: Family Medicine

## 2021-06-13 ENCOUNTER — Encounter: Payer: Self-pay | Admitting: Family Medicine

## 2021-06-13 ENCOUNTER — Telehealth: Payer: Self-pay | Admitting: Family Medicine

## 2021-06-13 VITALS — BP 144/70 | HR 105 | Resp 16 | Ht 67.0 in | Wt 122.1 lb

## 2021-06-13 DIAGNOSIS — E785 Hyperlipidemia, unspecified: Secondary | ICD-10-CM

## 2021-06-13 DIAGNOSIS — Z604 Social exclusion and rejection: Secondary | ICD-10-CM

## 2021-06-13 DIAGNOSIS — J449 Chronic obstructive pulmonary disease, unspecified: Secondary | ICD-10-CM

## 2021-06-13 DIAGNOSIS — E099 Drug or chemical induced diabetes mellitus without complications: Secondary | ICD-10-CM | POA: Diagnosis not present

## 2021-06-13 DIAGNOSIS — I10 Essential (primary) hypertension: Secondary | ICD-10-CM

## 2021-06-13 MED ORDER — AMLODIPINE BESYLATE 2.5 MG PO TABS: 2.5000 mg | ORAL_TABLET | Freq: Every day | ORAL | 2 refills | Status: DC

## 2021-06-13 NOTE — Telephone Encounter (Signed)
Shingrix vaccines received updated in patient chart.

## 2021-06-13 NOTE — Telephone Encounter (Signed)
Pt called stating he went to [facility] to get the shingrix vaccine. They told him he had already had this. I called phar and they are going to fax over the record for the vaccine.

## 2021-06-13 NOTE — Patient Instructions (Signed)
F/U in 6 weeks re eval bP ,callif yyou need me sooner  New additional medication for blood pressure is amlodipine 2.5 mg 1 daily.  Continue all medications you are currently taking.  Nurse please check with pharmacy patient needs Shingrix No. 2 if confirmed please send over an order  since he was told in the past that he only needed 1.( Per his report)  Also send order for Tdap.  Please get fasting lipid CMP and EGFR 1 week before your follow-up  It is important that you exercise regularly at least 30 minutes 5 times a week. If you develop chest pain, have severe difficulty breathing, or feel very tired, stop exercising immediately and seek medical attention   Thanks for choosing [facility], we consider it a privelige to serve you.

## 2021-06-14 NOTE — Progress Notes (Signed)
  Radiation Oncology         (336) [phone] ________________________________  Name: [name] [name]: [id number]  Date: 05/15/2021  DOB: 11-15-1950  3D Planning Note   Prostate Brachytherapy Post-Implant Dosimetry  Diagnosis: 71 y.o. gentleman with Stage T1c adenocarcinoma of the prostate with Gleason score of 3+4, and PSA of 9.5.  Narrative: On a previous date, [name] returned following prostate seed implantation for post implant planning. He underwent CT scan complex simulation to delineate the three-dimensional structures of the pelvis and demonstrate the radiation distribution.  Since that time, the seed localization, and complex isodose planning with dose volume histograms have now been completed.  Results:   Prostate Coverage - The dose of radiation delivered to the 90% or more of the prostate gland (D90) was 125.4% of the prescription dose. This exceeds our goal of greater than 90%. Rectal Sparing - The volume of rectal tissue receiving the prescription dose or higher was 0.0 cc. This falls under our thresholds tolerance of 1.0 cc.  Impression: The prostate seed implant appears to show adequate target coverage and appropriate rectal sparing.  Plan:  The patient will continue to follow with urology for ongoing PSA determinations. I would anticipate a high likelihood for local tumor control with minimal risk for rectal morbidity.  ________________________________  Sheral Apley Tammi Klippel, M.D.

## 2021-06-26 ENCOUNTER — Inpatient Hospital Stay: Payer: Medicare HMO | Attending: Adult Health | Admitting: *Deleted

## 2021-06-26 ENCOUNTER — Telehealth: Payer: Self-pay | Admitting: *Deleted

## 2021-06-26 ENCOUNTER — Encounter: Payer: Self-pay | Admitting: *Deleted

## 2021-06-26 ENCOUNTER — Other Ambulatory Visit: Payer: Self-pay

## 2021-06-26 VITALS — BP 154/82 | HR 110 | Temp 98.7°F | Resp 18 | Ht 67.0 in | Wt 117.6 lb

## 2021-06-26 DIAGNOSIS — C61 Malignant neoplasm of prostate: Secondary | ICD-10-CM

## 2021-06-26 NOTE — Progress Notes (Signed)
   Pt is here for SCP visit. He is accompanied by a friend, Ms. Inez Catalina. Pt's vitals are WNL. Pt denies pain and fatigue. Pt states he does have SOB w/ exertion. He has an hx of COPD. Pt is sleeping well and only gets up twice to bathroom at night. Bowels are regular. Pt has appt in July with Dr. Gloriann Loan. He will see PCP in July. Pt's vaccinations are updated except for his tetanus.  Cologuard done 11/22. Results were negative. Pt does walk for exercise. Overall, pt says he feels like he is doing well. SCP reviewed and completed.

## 2021-07-01 ENCOUNTER — Encounter: Payer: Self-pay | Admitting: Family Medicine

## 2021-07-01 NOTE — Assessment & Plan Note (Signed)
Unchanged, but content

## 2021-07-01 NOTE — Assessment & Plan Note (Signed)
Uncontrolled, inc amlodipine dose DASH diet and commitment to daily physical activity for a minimum of 30 minutes discussed and encouraged, as a part of hypertension management. The importance of attaining a healthy weight is also discussed.     06/26/2021   11:39 AM 06/13/2021    9:25 AM 06/13/2021    8:58 AM 05/18/2021    6:27 PM 05/18/2021    3:15 PM 05/18/2021    1:31 PM 05/04/2021    9:28 AM  BP/Weight  Systolic BP 154 144 148 128 130 138 149  Diastolic BP 82 70 71 76 80 71 59  Wt. (Lbs) 117.6  122.12    126.25  BMI 18.42 kg/m2  19.13 kg/m2    19.77 kg/m2

## 2021-07-01 NOTE — Assessment & Plan Note (Signed)
Currently stable and controlled, continue current meds Followed by Pulmonary

## 2021-07-17 ENCOUNTER — Ambulatory Visit: Payer: Medicare HMO | Admitting: Urology

## 2021-07-17 ENCOUNTER — Encounter: Payer: Self-pay | Admitting: Urology

## 2021-07-17 VITALS — BP 133/74 | HR 109

## 2021-07-17 DIAGNOSIS — C61 Malignant neoplasm of prostate: Secondary | ICD-10-CM | POA: Diagnosis not present

## 2021-07-17 LAB — URINALYSIS, ROUTINE W REFLEX MICROSCOPIC
Bilirubin, UA: NEGATIVE
Glucose, UA: NEGATIVE
Ketones, UA: NEGATIVE
Leukocytes,UA: NEGATIVE
Nitrite, UA: NEGATIVE
Protein,UA: NEGATIVE
RBC, UA: NEGATIVE
Specific Gravity, UA: 1.015 (ref 1.005–1.030)
Urobilinogen, Ur: 0.2 mg/dL (ref 0.2–1.0)
pH, UA: 7.5 (ref 5.0–7.5)

## 2021-07-17 NOTE — Progress Notes (Signed)
History of Present Illness: Here for follow-up of prostate cancer-treated.  9.14.2022: TRUS/Bx. PSA 9.5, prostate volume 33.6 mL, PSAD 0.28.   1/12 cores (RT base lateral) revealed GG 2 pattern in 40 % of core.  He comes with his friend, Madelaine Etienne.  He denies significant lower urinary tract symptoms.  [place] nomogram predictions  Extracapsular disease-59% Seminal vesicle/lymph node involvement-3% each 5/10-year progression free survival with radical prostatectomy-81/69%  4.3.2023: He underwent I-125 brachytherapy and placement of SpaceOAR.  7.11.2023: This is his first follow-up since his brachytherapy.  He does have some discomfort when urinating but overall feels like he is urinating well.  He denies blood in his urine or stool.  He wakes up 2-3 times a night to urinate.  He does not feel like he needs any treatment for his urinary situation.  Past Medical History:  Diagnosis Date   Asthma    CAP (community acquired pneumonia) 12/10/2018   COPD (chronic obstructive pulmonary disease) ([place])    COPD with acute exacerbation ([place]) 04/18/2018   CVA (cerebral vascular accident) ([place]) 2008   with temporary vision loss    Depression    ECHOCARDIOGRAM, ABNORMAL 03/06/2010   Qualifier: Diagnosis of  By: Purcell Nails, NP, Kathryn     Elevated PSA 2014   no diagnosis pf prostate cancer in 07/2014   History of substance abuse ([place]) 01/14/2011   Marijuana   History of substance abuse ([place]) 01/14/2011   History of tobacco abuse 01/14/2011   Hyperlipidemia    Hypertension    Lobar pneumonia ([place]) 12/13/2018   Marijuana abuse    Nicotine addiction    Prediabetes 2014    Past Surgical History:  Procedure Laterality Date   COLONOSCOPY     RADIOACTIVE SEED IMPLANT N/A 04/09/2021   Procedure: RADIOACTIVE SEED IMPLANT/BRACHYTHERAPY IMPLANT/64 SEEDS;  Surgeon: [name], MD;  Location: [facility];  Service: Urology;  Laterality: N/A;  [place] N/A 04/09/2021    Procedure: SPACE OAR INSTILLATION;  Surgeon: [name], MD;  Location: [facility];  Service: Urology;  Laterality: N/A;    Home Medications:  Allergies as of 07/17/2021   No Known Allergies      Medication List        Accurate as of July 17, 2021  1:03 PM. If you have any questions, ask your nurse or doctor.          acetaminophen 500 MG tablet Commonly known as: TYLENOL Take 500 mg by mouth every 6 (six) hours as needed for mild pain (as needed).   albuterol 108 (90 Base) MCG/ACT inhaler Commonly known as: VENTOLIN HFA INHALE 2 PUFFS INTO THE LUNGS EVERY FOUR HOURS AS NEEDED. ONLY IF YOU CAN'T CATCH YOUR BREATH.   albuterol (2.5 MG/3ML) 0.083% nebulizer solution Commonly known as: PROVENTIL Take 3 mLs (2.5 mg total) by nebulization every 6 (six) hours as needed for wheezing or shortness of breath.   amLODipine 5 MG tablet Commonly known as: NORVASC Take 1 tablet (5 mg total) by mouth daily.   amLODipine 2.5 MG tablet Commonly known as: NORVASC Take 1 tablet (2.5 mg total) by mouth daily.   aspirin EC 81 MG tablet Take 81 mg by mouth daily. Swallow whole.   Breztri Aerosphere 160-9-4.8 MCG/ACT Aero Generic drug: Budeson-Glycopyrrol-Formoterol INHALE (2) PUFFS INTO THE LUNGS IN THE MORNING AND AT BEDTIME   fluticasone 50 MCG/ACT nasal spray Commonly known as: FLONASE Place 1 spray into both nostrils daily as needed for allergies.  ibuprofen 200 MG tablet Commonly known as: ADVIL Take 200 mg by mouth every 6 (six) hours as needed for moderate pain.   metFORMIN 500 MG tablet Commonly known as: GLUCOPHAGE TAKE 1 TABLET TWICE DAILY WITH MEALS   montelukast 10 MG tablet Commonly known as: SINGULAIR Take 10 mg by mouth at bedtime.   pravastatin 40 MG tablet Commonly known as: PRAVACHOL TAKE 1 TABLET AT BEDTIME   predniSONE 5 MG tablet Commonly known as: DELTASONE TAKE 1 TABLET (5 MG TOTAL) BY MOUTH DAILY WITH BREAKFAST.    triamterene-hydrochlorothiazide 37.5-25 MG tablet Commonly known as: MAXZIDE-25 TAKE 1 AND 1/2 TABLETS EVERY DAY        Allergies: No Known Allergies  No family history on file.  Social History:  reports that he quit smoking about 6 years ago. His smoking use included cigarettes. He has a 20.00 pack-year smoking history. He has never used smokeless tobacco. He reports that he does not currently use alcohol after a past usage of about 3.0 standard drinks of alcohol per week. He reports that he does not use drugs.  ROS: A complete review of systems was performed.  All systems are negative except for pertinent findings as noted.  Physical Exam:  Vital signs in last 24 hours: There were no vitals taken for this visit. Constitutional:  Alert and oriented, No acute distress Cardiovascular: Regular rate  Respiratory: Normal respiratory effort Neurologic: Grossly intact, no focal deficits Psychiatric: Normal mood and affect  I have reviewed prior pt notes  I have reviewed urinalysis results  I have independently reviewed prior imaging-ultrasound volume  I have reviewed prior PSA and pathology results    Impression/Assessment:  Grade group 2 adenocarcinoma the prostate, just over 3 months out from I-125 brachytherapy and SpaceOAR.  Seems to be doing well.  Plan:  I will check PSA today  I will have him come back in 4 months for recheck

## 2021-07-18 ENCOUNTER — Other Ambulatory Visit: Payer: Self-pay | Admitting: Family Medicine

## 2021-07-18 DIAGNOSIS — I10 Essential (primary) hypertension: Secondary | ICD-10-CM | POA: Diagnosis not present

## 2021-07-18 DIAGNOSIS — E785 Hyperlipidemia, unspecified: Secondary | ICD-10-CM

## 2021-07-18 LAB — PSA: Prostate Specific Ag, Serum: 4.6 ng/mL — ABNORMAL HIGH (ref 0.0–4.0)

## 2021-07-19 LAB — CMP14+EGFR
ALT: 12 IU/L (ref 0–44)
AST: 16 IU/L (ref 0–40)
Albumin/Globulin Ratio: 2 (ref 1.2–2.2)
Albumin: 4.3 g/dL (ref 3.8–4.8)
Alkaline Phosphatase: 73 IU/L (ref 44–121)
BUN/Creatinine Ratio: 13 (ref 10–24)
BUN: 12 mg/dL (ref 8–27)
Bilirubin Total: 0.5 mg/dL (ref 0.0–1.2)
CO2: 27 mmol/L (ref 20–29)
Calcium: 9.7 mg/dL (ref 8.6–10.2)
Chloride: 98 mmol/L (ref 96–106)
Creatinine, Ser: 0.91 mg/dL (ref 0.76–1.27)
Globulin, Total: 2.1 g/dL (ref 1.5–4.5)
Glucose: 97 mg/dL (ref 70–99)
Potassium: 4 mmol/L (ref 3.5–5.2)
Sodium: 141 mmol/L (ref 134–144)
Total Protein: 6.4 g/dL (ref 6.0–8.5)
eGFR: 89 mL/min/{1.73_m2} (ref >59)

## 2021-07-19 LAB — LIPID PANEL
Chol/HDL Ratio: 2.2 ratio (ref 0.0–5.0)
Cholesterol, Total: 183 mg/dL (ref 100–199)
HDL: 85 mg/dL (ref >39)
LDL Chol Calc (NIH): 90 mg/dL (ref 0–99)
Triglycerides: 35 mg/dL (ref 0–149)
VLDL Cholesterol Cal: 8 mg/dL (ref 5–40)

## 2021-07-20 ENCOUNTER — Ambulatory Visit: Payer: Medicare HMO | Admitting: Pulmonary Disease

## 2021-07-20 ENCOUNTER — Encounter: Payer: Self-pay | Admitting: Pulmonary Disease

## 2021-07-20 VITALS — BP 138/90 | HR 102 | Temp 98.4°F | Ht 67.0 in | Wt 121.2 lb

## 2021-07-20 DIAGNOSIS — J9611 Chronic respiratory failure with hypoxia: Secondary | ICD-10-CM

## 2021-07-20 DIAGNOSIS — J479 Bronchiectasis, uncomplicated: Secondary | ICD-10-CM

## 2021-07-20 DIAGNOSIS — J449 Chronic obstructive pulmonary disease, unspecified: Secondary | ICD-10-CM

## 2021-07-20 DIAGNOSIS — Z7952 Long term (current) use of systemic steroids: Secondary | ICD-10-CM | POA: Diagnosis not present

## 2021-07-20 MED ORDER — BREZTRI AEROSPHERE 160-9-4.8 MCG/ACT IN AERO: INHALATION_SPRAY | RESPIRATORY_TRACT | 11 refills | Status: DC

## 2021-07-20 MED ORDER — PREDNISONE 2.5 MG PO TABS: 2.5000 mg | ORAL_TABLET | Freq: Every day | ORAL | 4 refills | Status: DC

## 2021-07-20 NOTE — Progress Notes (Signed)
Pulmonary, Critical Care, and Sleep Medicine  Chief Complaint  Patient presents with   Follow-up    Breathing is about the same since last ov. States its worse when weather is hot and humid.     Constitutional:  BP 138/90 (BP Location: Left Arm, Patient Position: Sitting)   Pulse (!) 102   Temp 98.4 F (36.9 C) (Temporal)   Ht '5\' 7"'$  (1.702 m)   Wt 121 lb 3.2 oz (55 kg)   SpO2 91% Comment: ra after walking form lobby to room  BMI 18.98 kg/m   Past Medical History:  Pneumonia, CVA, Depression, HLD, HTN, Prostate cancer  Past Surgical History:  He  has a past surgical history that includes Colonoscopy; Radioactive seed implant (N/A, 04/09/2021); and SPACE OAR INSTILLATION (N/A, 04/09/2021).  Brief Summary:  [name] is a 71 y.o. male former cigarette and marijuana smoker with COPD with emphysema and bronchiectasis, and chronic hypoxic/hypercapnic respiratory failure.       Subjective:   No issues after prostate surgery.  Hospital didn't have breztri on formulary.  He was switched to breo and incruse in hospital, and sent home with these.  He didn't use these at home, and switched back to breztri.  He feels breztri works better.  He has spacer, but hasn't been using.  Using oxygen at night.  Doesn't use as much during the day.  Hot weather makes it harder on his breathing.  Otherwise not having cough, wheeze, sputum, or chest congestion.  Denies leg swelling.  Physical Exam:   Appearance - well kempt, thin  ENMT - no sinus tenderness, no oral exudate, no LAN, Mallampati 3 airway, no stridor, lipoma on left side of jaw  Respiratory - equal breath sounds bilaterally, no wheezing or rales  CV - s1s2 regular rate and rhythm, no murmurs  Ext - no clubbing, no edema  Skin - no rashes  Psych - normal mood and affect      Pulmonary testing:  ABG on 28% FiO2 12/11/18 >> pH 7.385, PCO2 50.2, PO2 102 PFT 07/29/19 >> FEV1 0.63 (24%), FEV1% 32, TLC 9.24 (143%),  DLCO 63%  Chest Imaging:  CT chest 07/05/19 >> mild centrilobular and paraseptal emphysema, BTX lower lobes, bulla RLL  Social History:  He  reports that he quit smoking about 6 years ago. His smoking use included cigarettes. He has a 20.00 pack-year smoking history. He has never used smokeless tobacco. He reports that he does not currently use alcohol after a past usage of about 3.0 standard drinks of alcohol per week. He reports that he does not use drugs.  Family History:  His family history is not on file.     Assessment/Plan:   Severe COPD with emphysema. - previously seen by Dr. Luan Pulling and Dr. Melvyn Novas - has been prednisone dependent since July 2021 - will try to continue gradually decreasing prednisone - will change prednisone to 2.5 mg daily - continue breztri - prn albuterol; advised her could pretreat with albuterol when he goes out in hot weather - advised him to not use breo and incruse that he got during hospital stay - he has a spacer device but doesn't use it  Bronchiectasis. - likely from prior episodes of pneumonia - prn mucinex, flutter valve  Chronic hypoxic/hypercapnic respiratory failure. - 3 liters with exertion and sleep - uses [employer] for his DME  Social determinants of health. - he has limited reading ability which impacts his ability to follow through with  medical instructions  Time Spent Involved in Patient Care on Day of Examination:  36 minutes  Follow up:   Patient Instructions  Start lower dose of prednisone with new prescription  Follow up in 4 months  Medication List:   Allergies as of 07/20/2021   No Known Allergies      Medication List        Accurate as of July 20, 2021 10:19 AM. If you have any questions, ask your nurse or doctor.          STOP taking these medications    acetaminophen 500 MG tablet Commonly known as: TYLENOL Stopped by: Chesley Mires, MD   aspirin EC 81 MG tablet Stopped by: Chesley Mires, MD    fluticasone 50 MCG/ACT nasal spray Commonly known as: FLONASE Stopped by: Chesley Mires, MD   ibuprofen 200 MG tablet Commonly known as: ADVIL Stopped by: Chesley Mires, MD       TAKE these medications    albuterol 108 (90 Base) MCG/ACT inhaler Commonly known as: VENTOLIN HFA INHALE 2 PUFFS INTO THE LUNGS EVERY FOUR HOURS AS NEEDED. ONLY IF YOU CAN'T CATCH YOUR BREATH.   albuterol (2.5 MG/3ML) 0.083% nebulizer solution Commonly known as: PROVENTIL Take 3 mLs (2.5 mg total) by nebulization every 6 (six) hours as needed for wheezing or shortness of breath.   amLODipine 2.5 MG tablet Commonly known as: NORVASC Take 1 tablet (2.5 mg total) by mouth daily. What changed: Another medication with the same name was removed. Continue taking this medication, and follow the directions you see here. Changed by: Chesley Mires, MD   Arnell Sieving [phone] MCG/ACT Aero Generic drug: Budeson-Glycopyrrol-Formoterol INHALE (2) PUFFS INTO THE LUNGS IN THE MORNING AND AT BEDTIME   metFORMIN 500 MG tablet Commonly known as: GLUCOPHAGE TAKE 1 TABLET TWICE DAILY WITH MEALS   montelukast 10 MG tablet Commonly known as: SINGULAIR Take 10 mg by mouth at bedtime.   pravastatin 40 MG tablet Commonly known as: PRAVACHOL TAKE 1 TABLET AT BEDTIME   predniSONE 2.5 MG tablet Commonly known as: DELTASONE Take 1 tablet (2.5 mg total) by mouth daily with breakfast. What changed:  medication strength how much to take Changed by: Chesley Mires, MD   triamterene-hydrochlorothiazide 37.5-25 MG tablet Commonly known as: MAXZIDE-25 TAKE 1 AND 1/2 TABLETS EVERY DAY        Signature:  Chesley Mires, MD [facility] Pager - [phone] 07/20/2021, 10:19 AM

## 2021-07-20 NOTE — Patient Instructions (Signed)
Start lower dose of prednisone with new prescription  Follow up in 4 months

## 2021-07-24 ENCOUNTER — Telehealth: Payer: Self-pay | Admitting: Pulmonary Disease

## 2021-07-24 NOTE — Telephone Encounter (Signed)
ATC patient. LVM letting him know he could pick up a breztri sample from 8-5 any day this week, and that we are closed form 12-1 for lunch. Nothing further needed

## 2021-07-25 ENCOUNTER — Ambulatory Visit (INDEPENDENT_AMBULATORY_CARE_PROVIDER_SITE_OTHER): Payer: Medicare HMO | Admitting: Family Medicine

## 2021-07-25 ENCOUNTER — Encounter: Payer: Self-pay | Admitting: Family Medicine

## 2021-07-25 ENCOUNTER — Telehealth: Payer: Self-pay

## 2021-07-25 VITALS — BP 134/70 | HR 91 | Resp 17 | Ht 67.0 in | Wt 119.0 lb

## 2021-07-25 DIAGNOSIS — E785 Hyperlipidemia, unspecified: Secondary | ICD-10-CM

## 2021-07-25 DIAGNOSIS — I10 Essential (primary) hypertension: Secondary | ICD-10-CM

## 2021-07-25 DIAGNOSIS — J449 Chronic obstructive pulmonary disease, unspecified: Secondary | ICD-10-CM

## 2021-07-25 DIAGNOSIS — E559 Vitamin D deficiency, unspecified: Secondary | ICD-10-CM | POA: Diagnosis not present

## 2021-07-25 DIAGNOSIS — C61 Malignant neoplasm of prostate: Secondary | ICD-10-CM

## 2021-07-25 DIAGNOSIS — E099 Drug or chemical induced diabetes mellitus without complications: Secondary | ICD-10-CM | POA: Diagnosis not present

## 2021-07-25 MED ORDER — TRIAMTERENE-HCTZ 37.5-25 MG PO TABS: ORAL_TABLET | ORAL | 3 refills | Status: DC

## 2021-07-25 MED ORDER — MONTELUKAST SODIUM 10 MG PO TABS: 10.0000 mg | ORAL_TABLET | Freq: Every day | ORAL | 3 refills | Status: DC

## 2021-07-25 MED ORDER — AMLODIPINE BESYLATE 2.5 MG PO TABS: 2.5000 mg | ORAL_TABLET | Freq: Every day | ORAL | 3 refills | Status: DC

## 2021-07-25 MED ORDER — SIMPLY SALINE 0.9 % NA AERS: INHALATION_SPRAY | NASAL | 2 refills | Status: DC

## 2021-07-25 MED ORDER — AMLODIPINE BESYLATE 5 MG PO TABS: 5.0000 mg | ORAL_TABLET | Freq: Every day | ORAL | 3 refills | Status: DC

## 2021-07-25 NOTE — Telephone Encounter (Signed)
-----   Message from [name], MD sent at 07/23/2021  6:55 AM EDT ----- Please let pt know that his psa is down after radiation--give # ----- Message ----- From: Iris Pert, LPN Sent: 6/94/8546  11:41 AM EDT To: [name], MD  Please review

## 2021-07-25 NOTE — Telephone Encounter (Signed)
Patient called with no answer. Detailed message left. °

## 2021-07-25 NOTE — Patient Instructions (Signed)
F/U early October, flu vaccine at visit, call if you need me sooner  Excellent blood pressure and labs, no med change  Flonase removed as not effective  Use vicks and saline flushes to nostril  Non fasting HBA1C, chem 7 and EGFR, TSH, vit D and CBC 7to5 days before October appointment  Thanks for choosing [facility], we consider it a privelige to serve you.

## 2021-07-26 ENCOUNTER — Telehealth: Payer: Self-pay

## 2021-07-26 ENCOUNTER — Encounter: Payer: Self-pay | Admitting: Family Medicine

## 2021-07-26 NOTE — Assessment & Plan Note (Signed)
Managed by Pulmonary and controlled

## 2021-07-26 NOTE — Telephone Encounter (Signed)
[name] called stating the patient would like a call back with previous PSA results.  Per MD to call pt with results and give number on lab result review.  Called patient back and informed him of his results from last PSA to most recent.  Patient voiced understanding

## 2021-07-26 NOTE — Assessment & Plan Note (Signed)
Followed closely by Urology, recent lab improved

## 2021-07-26 NOTE — Assessment & Plan Note (Signed)
Hyperlipidemia:Low fat diet discussed and encouraged.   Lipid Panel  Lab Results  Component Value Date   CHOL 183 07/18/2021   HDL 85 07/18/2021   LDLCALC 90 07/18/2021   TRIG 35 07/18/2021   CHOLHDL 2.2 07/18/2021     Controlled, no change in medication

## 2021-07-26 NOTE — Progress Notes (Signed)
[name]     [name]: [id number]      DOB: 03-12-1950   HPI [name] is here for follow up and re-evaluation of chronic medical conditions, medication management and review of any available recent lab and radiology data.  Preventive health is updated, specifically  Cancer screening and Immunization.   Questions or concerns regarding consultations or procedures which the PT has had in the interim are  addressed. The PT denies any adverse reactions to current medications since the last visit. States not using flonase as not effective in clearing nostrils There are no new concerns.  There are no specific complaints   ROS Denies recent fever or chills. Denies sinus pressure, nasal congestion, ear pain or sore throat. Denies chest congestion, productive cough or wheezing. Denies chest pains, palpitations and leg swelling Denies abdominal pain, nausea, vomiting,diarrhea or constipation.   Denies dysuria, frequency, hesitancy or incontinence. Denies joint pain, swelling and limitation in mobility. Denies headaches, seizures, numbness, or tingling. Denies depression, anxiety or insomnia. Denies skin break down or rash.   PE  BP 134/70   Pulse 91   Resp 17   Ht '5\' 7"'$  (1.702 m)   Wt 119 lb (54 kg)   SpO2 91%   BMI 18.64 kg/m   Patient alert and oriented and in no cardiopulmonary distress.  HEENT: No facial asymmetry, EOMI,     Neck supple .  Chest: Clear to auscultation bilaterally.Decreased air entry  CVS: S1, S2 no murmurs, no S3.Regular rate.  ABD: Soft non tender.   Ext: No edema  MS: Adequate ROM spine, shoulders, hips and knees.  Skin: Intact, no ulcerations or rash noted.  Psych: Good eye contact, normal affect. Memory intact not anxious or depressed appearing.  CNS: CN 2-12 intact, power,  normal throughout.no focal deficits noted.   Assessment & Plan  Essential hypertension Controlled, no change in medication DASH diet and commitment to daily physical  activity for a minimum of 30 minutes discussed and encouraged, as a part of hypertension management. The importance of attaining a healthy weight is also discussed.     07/25/2021    9:27 AM 07/20/2021    9:56 AM 07/17/2021   12:53 PM 06/26/2021   11:39 AM 06/13/2021    9:25 AM 06/13/2021    8:58 AM 05/18/2021    6:27 PM  BP/Weight  Systolic BP 409 811 914 782 956 213 086  Diastolic BP 70 90 74 82 70 71 76  Wt. (Lbs) 119 121.2  117.6  122.12   BMI 18.64 kg/m2 18.98 kg/m2  18.42 kg/m2  19.13 kg/m2        Hyperlipidemia Hyperlipidemia:Low fat diet discussed and encouraged.   Lipid Panel  Lab Results  Component Value Date   CHOL 183 07/18/2021   HDL 85 07/18/2021   LDLCALC 90 07/18/2021   TRIG 35 07/18/2021   CHOLHDL 2.2 07/18/2021     Controlled, no change in medication   DM (diabetes mellitus) [facility]) [name] is reminded of the importance of commitment to daily physical activity for 30 minutes or more, as able and the need to limit carbohydrate intake to 30 to 60 grams per meal to help with blood sugar control.   The need to take medication as prescribed, test blood sugar as directed, and to call between visits if there is a concern that blood sugar is uncontrolled is also discussed.   [name] is reminded of the importance of daily foot exam, annual eye  examination, and good blood sugar, blood pressure and cholesterol control. Controlled, no change in medication      Latest Ref Rng & Units 07/18/2021    8:52 AM 05/18/2021    1:48 PM 04/05/2021    9:10 AM 03/08/2021    9:13 AM 11/16/2020   11:31 AM  Diabetic Labs  HbA1c 4.8 - 5.6 %    6.6  6.2   Chol 100 - 199 mg/dL 183    195    HDL >39 mg/dL 85    84    Calc LDL 0 - 99 mg/dL 90    100    Triglycerides 0 - 149 mg/dL 35    60    Creatinine 0.76 - 1.27 mg/dL 0.91  0.96  0.84  0.89  0.90       07/25/2021    9:27 AM 07/20/2021    9:56 AM 07/17/2021   12:53 PM 06/26/2021   11:39 AM 06/13/2021    9:25 AM 06/13/2021     8:58 AM 05/18/2021    6:27 PM  BP/Weight  Systolic BP 270 350 093 818 299 371 696  Diastolic BP 70 90 74 82 70 71 76  Wt. (Lbs) 119 121.2  117.6  122.12   BMI 18.64 kg/m2 18.98 kg/m2  18.42 kg/m2  19.13 kg/m2       Latest Ref Rng & Units 06/06/2020   12:00 AM  Foot/eye exam completion dates  Eye Exam No Retinopathy No Retinopathy No Retinopathy       No Retinopathy         This result is from an external source.   Multiple values from one day are sorted in reverse-chronological order        Asthma with COPD (chronic obstructive pulmonary disease) Managed by Pulmonary and controlled  Malignant neoplasm of prostate ([place]) Followed closely by Urology, recent lab improved

## 2021-07-26 NOTE — Assessment & Plan Note (Signed)
Controlled, no change in medication DASH diet and commitment to daily physical activity for a minimum of 30 minutes discussed and encouraged, as a part of hypertension management. The importance of attaining a healthy weight is also discussed.     07/25/2021    9:27 AM 07/20/2021    9:56 AM 07/17/2021   12:53 PM 06/26/2021   11:39 AM 06/13/2021    9:25 AM 06/13/2021    8:58 AM 05/18/2021    6:27 PM  BP/Weight  Systolic BP 701 410 301 314 388 875 797  Diastolic BP 70 90 74 82 70 71 76  Wt. (Lbs) 119 121.2  117.6  122.12   BMI 18.64 kg/m2 18.98 kg/m2  18.42 kg/m2  19.13 kg/m2

## 2021-07-26 NOTE — Assessment & Plan Note (Signed)
Mr. Lowrimore is reminded of the importance of commitment to daily physical activity for 30 minutes or more, as able and the need to limit carbohydrate intake to 30 to 60 grams per meal to help with blood sugar control.   The need to take medication as prescribed, test blood sugar as directed, and to call between visits if there is a concern that blood sugar is uncontrolled is also discussed.   Mr. Flahive is reminded of the importance of daily foot exam, annual eye examination, and good blood sugar, blood pressure and cholesterol control. Controlled, no change in medication      Latest Ref Rng & Units 07/18/2021    8:52 AM 05/18/2021    1:48 PM 04/05/2021    9:10 AM 03/08/2021    9:13 AM 11/16/2020   11:31 AM  Diabetic Labs  HbA1c 4.8 - 5.6 %    6.6  6.2   Chol 100 - 199 mg/dL 183    195    HDL >39 mg/dL 85    84    Calc LDL 0 - 99 mg/dL 90    100    Triglycerides 0 - 149 mg/dL 35    60    Creatinine 0.76 - 1.27 mg/dL 0.91  0.96  0.84  0.89  0.90       07/25/2021    9:27 AM 07/20/2021    9:56 AM 07/17/2021   12:53 PM 06/26/2021   11:39 AM 06/13/2021    9:25 AM 06/13/2021    8:58 AM 05/18/2021    6:27 PM  BP/Weight  Systolic BP 485 462 703 500 938 182 993  Diastolic BP 70 90 74 82 70 71 76  Wt. (Lbs) 119 121.2  117.6  122.12   BMI 18.64 kg/m2 18.98 kg/m2  18.42 kg/m2  19.13 kg/m2       Latest Ref Rng & Units 06/06/2020   12:00 AM  Foot/eye exam completion dates  Eye Exam No Retinopathy No Retinopathy No Retinopathy       No Retinopathy         This result is from an external source.   Multiple values from one day are sorted in reverse-chronological order

## 2021-08-02 ENCOUNTER — Ambulatory Visit (INDEPENDENT_AMBULATORY_CARE_PROVIDER_SITE_OTHER): Payer: Medicare HMO | Admitting: *Deleted

## 2021-08-02 NOTE — Patient Instructions (Signed)
Visit Information  Thank you for taking time to visit with me today. Please don't hesitate to contact me if I can be of assistance to you before our next scheduled telephone appointment.  Following are the goals we discussed today:  Take medications as prescribed   Attend all scheduled provider appointments Call pharmacy for medication refills 3-7 days in advance of running out of medications Perform all self care activities independently  Perform IADL's (shopping, preparing meals, housekeeping, managing finances) independently Call provider office for new concerns or questions  check feet daily for cuts, sores or redness trim toenails straight across fill half of plate with vegetables limit fast food meals to no more than 1 per week manage portion size keep feet up while sitting wash and dry feet carefully every day wear comfortable, cotton socks wear comfortable, well-fitting shoes identify and remove indoor air pollutants limit outdoor activity during cold weather follow rescue plan if symptoms flare-up get at least 7 to 8 hours of sleep at night practice relaxation or meditation daily do breathing exercises every day Take glucometer to your doctor's office for help with learning how to use Ask your friend for assistance if needed Wash your hands good and wear a mask if needed, avoid sick people Follow low salt diet- limit fast food Case closure today  Please call the care guide team at [phone] if you need to cancel or reschedule your appointment.   If you are experiencing a Mental Health or [place] or need someone to talk to, please call the Suicide and Crisis Lifeline: 988 call the [place] National Suicide Prevention Lifeline: (431) 422-0629 or TTY: 432 564 5425 TTY 954 845 0184) to talk to a trained counselor call 1-800-273-TALK (toll free, 24 hour hotline) go to [facility] [address] [place]  [phone]) call the [place]: [phone] call 911   The patient verbalized understanding of instructions, educational materials, and care plan provided today and DECLINED offer to receive copy of patient instructions, educational materials, and care plan.   Jacqlyn Larsen [facility], BSN RN Case Manager [facility] [phone]

## 2021-08-02 NOTE — Chronic Care Management (AMB) (Signed)
Chronic Care Management   CCM RN Visit Note  08/02/2021 Name: [name] [name]: [id number] DOB: 08-Dec-1950  Subjective: [name] is a 71 y.o. year old male who is a primary care patient of [name], MD. The care management team was consulted for assistance with disease management and care coordination needs.    Engaged with patient by telephone for follow up visit in response to provider referral for case management and/or care coordination services.   Consent to Services:  The patient was given information about Chronic Care Management services, agreed to services, and gave verbal consent prior to initiation of services.  Please see initial visit note for detailed documentation.   Patient agreed to services and verbal consent obtained.   Assessment: Review of patient past medical history, allergies, medications, health status, including review of consultants reports, laboratory and other test data, was performed as part of comprehensive evaluation and provision of chronic care management services.   SDOH (Social Determinants of Health) assessments and interventions performed:    CCM Care Plan  No Known Allergies  Outpatient Encounter Medications as of 08/02/2021  Medication Sig   albuterol (PROVENTIL) (2.5 MG/3ML) 0.083% nebulizer solution Take 3 mLs (2.5 mg total) by nebulization every 6 (six) hours as needed for wheezing or shortness of breath.   albuterol (VENTOLIN HFA) 108 (90 Base) MCG/ACT inhaler INHALE 2 PUFFS INTO THE LUNGS EVERY FOUR HOURS AS NEEDED. ONLY IF YOU CAN'T CATCH YOUR BREATH.   amLODipine (NORVASC) 2.5 MG tablet Take 1 tablet (2.5 mg total) by mouth daily.   amLODipine (NORVASC) 5 MG tablet Take 1 tablet (5 mg total) by mouth daily.   Budeson-Glycopyrrol-Formoterol (BREZTRI AEROSPHERE) 160-9-4.8 MCG/ACT AERO INHALE (2) PUFFS INTO THE LUNGS IN THE MORNING AND AT BEDTIME   metFORMIN (GLUCOPHAGE) 500 MG tablet TAKE 1 TABLET TWICE DAILY WITH MEALS    montelukast (SINGULAIR) 10 MG tablet Take 1 tablet (10 mg total) by mouth at bedtime.   pravastatin (PRAVACHOL) 40 MG tablet TAKE 1 TABLET AT BEDTIME   predniSONE (DELTASONE) 2.5 MG tablet Take 1 tablet (2.5 mg total) by mouth daily with breakfast. (Patient taking differently: Take 5 mg by mouth daily with breakfast.)   Saline (SIMPLY SALINE) 0.9 % AERS One spray ionto each nostril, 3 times daily, as needed, for nasal congestion   triamterene-hydrochlorothiazide (MAXZIDE-25) 37.5-25 MG tablet Take one and a half tablets by mouth once daily   [DISCONTINUED] simvastatin (ZOCOR) 40 MG tablet Take 40 mg by mouth daily.   No facility-administered encounter medications on file as of 08/02/2021.    Patient Active Problem List   Diagnosis Date Noted   Prostate cancer ([place]) 04/09/2021   Malignant neoplasm of prostate ([place]) 02/13/2021   Rhinitis, chronic 06/10/2019   Pulmonary infiltrates on CXR 05/29/2019   COPD GOLD IV/ group D  05/11/2019   Low oxygen saturation 03/23/2019   Food insecurity 11/22/2018   DOE (dyspnea on exertion) 04/18/2018   Annual physical exam 11/10/2013   Social isolation 03/17/2013   Asthma with COPD (chronic obstructive pulmonary disease) ([place]) 11/12/2012   Tachycardia 11/05/2010   DM (diabetes mellitus) ([place]) 10/26/2009   Vitamin D deficiency 06/28/2009   PSA, INCREASED 01/25/2009   Hyperlipidemia 07/30/2007   Essential hypertension 07/30/2007    Conditions to be addressed/monitored:HTN and COPD  Care Plan : RN Care Manager Plan of Care  Updates made by Kassie Mends, RN since 08/02/2021 12:00 AM  Completed 08/02/2021   Problem: No plan of care  established for management of chronic disease states  (COPD, DM2, HTN) Resolved 08/02/2021  Priority: High     Long-Range Goal: Development of plan of care for chronic disease management (COPD, DM2, HTN) Completed 08/02/2021  Start Date: 12/28/2020  Expected End Date: 06/26/2021  Priority: High  Note:   Current  Barriers:  Knowledge Deficits related to plan of care for management of HTN, COPD, and DMII  Knowledge deficits related to basic understanding of COPD disease process and importance of oxygen management and safety, pt reports he received flu vaccine, covid booster and shingles vaccine. Knowledge deficits related to basic COPD self care/management Literacy barriers- patient cannot read and has friend that reads his mail etc for him Transportation barriers- patient rides Moped to appointments, shopping, etc.- has friend who provides transportation at times, pt is aware of [employer] transportation service. Limited Social Support- adult children live out of state, patient is widowed, has friend Audiological scientist) that helps him Microbiologist - patient does not go to church or other groups (due respiratory issues) Knowledge Deficits related to basic understanding of hypertension pathophysiology and self care management- patient does not check blood pressure at home, will continue to have checked at doctor's office or retail store Knowledge Deficits related to basic Diabetes pathophysiology and self care/management- pt takes metformin with elevated CBG most likely form prednisone usage (per notes) Does not use cbg meter - patient reports he is currently not checking CBG Does not adhere to provider recommendations re: ADA diet, pt eats what he wants and continues to drink sugary soft drinks, eats out alot Does not contact provider office for questions/concerns- patient does not always stay in touch with doctor for problems/ issues that may arise 08/02/21- patient reports he is following urologist for prostate cancer, reports blood pressure has "been ok"   no new concerns reported  RNCM Clinical Goal(s):  Patient will verbalize understanding of plan for management of COPD and DMII as evidenced by patient report, review of EHR and  through collaboration with RN Care manager, provider, and care team.    Interventions: 1:1 collaboration with primary care provider regarding development and update of comprehensive plan of care as evidenced by provider attestation and co-signature Inter-disciplinary care team collaboration (see longitudinal plan of care) Evaluation of current treatment plan related to  self management and patient's adherence to plan as established by provider   COPD Interventions:  (Status:  Goal on track:  Yes.) Long Term Goal Advised patient to track and manage COPD triggers Advised patient to self assesses COPD action plan zone and make appointment with provider if in the yellow zone for 48 hours without improvement Advised patient to engage in light exercise as tolerated 3-5 days a week to aid in the the management of COPD Discussed the importance of adequate rest and management of fatigue with COPD Pain assessment completed Reinforced infection prevention, importance of good handwashing, wearing a mask as needed Reviewed plan of care with patient including case closure today  Diabetes Interventions:  (Status:  Goal on track:  Yes.) Long Term Goal Assessed patient's understanding of A1c goal: <7% Reviewed medications with patient and discussed importance of medication adherence Counseled on importance of regular laboratory monitoring as prescribed Discussed plans with patient for ongoing care management follow up and provided patient with direct contact information for care management team Review of patient status, including review of consultants reports, relevant laboratory and other test results, and medications completed Reinforced carbohydrate modified diet Reinforced taking glucometer to primary  care provider office if needed for instruction on using glucometer and asking friend for help Lab Results  Component Value Date   HGBA1C 6.2 (H) 11/16/2020    Patient Goals/Self-Care Activities: Take medications as prescribed   Attend all scheduled provider  appointments Call pharmacy for medication refills 3-7 days in advance of running out of medications Perform all self care activities independently  Perform IADL's (shopping, preparing meals, housekeeping, managing finances) independently Call provider office for new concerns or questions  check feet daily for cuts, sores or redness trim toenails straight across fill half of plate with vegetables limit fast food meals to no more than 1 per week manage portion size keep feet up while sitting wash and dry feet carefully every day wear comfortable, cotton socks wear comfortable, well-fitting shoes identify and remove indoor air pollutants limit outdoor activity during cold weather follow rescue plan if symptoms flare-up get at least 7 to 8 hours of sleep at night practice relaxation or meditation daily do breathing exercises every day Take glucometer to your doctor's office for help with learning how to use Ask your friend for assistance if needed Wash your hands good and wear a mask if needed, avoid sick people Follow low salt diet- limit fast food Case closure today       Plan:No further follow up required: case closure today  Jacqlyn Larsen [facility], BSN RN Case Advertising copywriter Primary Care [phone]

## 2021-08-06 DIAGNOSIS — I10 Essential (primary) hypertension: Secondary | ICD-10-CM

## 2021-08-06 DIAGNOSIS — J449 Chronic obstructive pulmonary disease, unspecified: Secondary | ICD-10-CM | POA: Diagnosis not present

## 2021-08-28 ENCOUNTER — Telehealth: Payer: Self-pay | Admitting: Pulmonary Disease

## 2021-08-28 MED ORDER — BREZTRI AEROSPHERE 160-9-4.8 MCG/ACT IN AERO
2.0000 | INHALATION_SPRAY | Freq: Two times a day (BID) | RESPIRATORY_TRACT | 0 refills | Status: DC
Start: 2021-08-28 — End: 2021-12-11

## 2021-08-28 NOTE — Telephone Encounter (Signed)
Called and spoke to patient and let him know that I would leave 2 samples at the front desk for  him to pick up in [place] office. He voiced understanding. Nothing further needed at this time.

## 2021-09-10 ENCOUNTER — Other Ambulatory Visit: Payer: Self-pay | Admitting: Pulmonary Disease

## 2021-10-18 ENCOUNTER — Telehealth: Payer: Self-pay | Admitting: Pulmonary Disease

## 2021-10-18 NOTE — Telephone Encounter (Signed)
Called and notified patient that I would leave a sample at front in [place] office. Nothing further needed for now.

## 2021-10-25 ENCOUNTER — Ambulatory Visit (INDEPENDENT_AMBULATORY_CARE_PROVIDER_SITE_OTHER): Payer: Medicare HMO | Admitting: Family Medicine

## 2021-10-25 ENCOUNTER — Encounter: Payer: Self-pay | Admitting: Family Medicine

## 2021-10-25 ENCOUNTER — Telehealth: Payer: Self-pay | Admitting: Pulmonary Disease

## 2021-10-25 VITALS — BP 142/80 | HR 115 | Ht 67.0 in | Wt 123.1 lb

## 2021-10-25 DIAGNOSIS — E119 Type 2 diabetes mellitus without complications: Secondary | ICD-10-CM | POA: Diagnosis not present

## 2021-10-25 DIAGNOSIS — E785 Hyperlipidemia, unspecified: Secondary | ICD-10-CM

## 2021-10-25 DIAGNOSIS — I10 Essential (primary) hypertension: Secondary | ICD-10-CM

## 2021-10-25 DIAGNOSIS — E099 Drug or chemical induced diabetes mellitus without complications: Secondary | ICD-10-CM

## 2021-10-25 DIAGNOSIS — Z23 Encounter for immunization: Secondary | ICD-10-CM

## 2021-10-25 DIAGNOSIS — J4489 Other specified chronic obstructive pulmonary disease: Secondary | ICD-10-CM | POA: Diagnosis not present

## 2021-10-25 MED ORDER — AMLODIPINE BESYLATE 10 MG PO TABS: 10.0000 mg | ORAL_TABLET | Freq: Every day | ORAL | 3 refills | Status: DC

## 2021-10-25 MED ORDER — PREDNISONE 10 MG PO TABS: ORAL_TABLET | ORAL | 0 refills | Status: DC

## 2021-10-25 NOTE — Telephone Encounter (Signed)
Gave patient 2 samples while he was waiting in lobby for appt with PCP. Nothing further needed

## 2021-10-25 NOTE — Patient Instructions (Addendum)
Follow-up first week in December reevaluate blood pressure, call if you need me sooner.  Flu vaccine today.  Please get covid vaccine at your pharmacy in the next 1 to 2 weeks  Amlodipine dose is increased to 10 mg 1 tablet daily.  Start this tomorrow.  Stop amlodipine 5 mg and 2.5 mg tablets.  Blood pressure is still above goal.  Good foot exam at visit.  Prednisone 20 mg is prescribed for short.   time because of increased breathing difficulty please take as directed.  Labs today HbA1c CMP and EGFR lipid panel.  Microalbumin to be sent today also.  Thanks for choosing [facility], we consider it a privelige to serve you.

## 2021-10-25 NOTE — Assessment & Plan Note (Signed)
Mr. Sally is reminded of the importance of commitment to daily physical activity for 30 minutes or more, as able and the need to limit carbohydrate intake to 30 to 60 grams per meal to help with blood sugar control.   The need to take medication as prescribed, test blood sugar as directed, and to call between visits if there is a concern that blood sugar is uncontrolled is also discussed.   Mr. Lawhorn is reminded of the importance of daily foot exam, annual eye examination, and good blood sugar, blood pressure and cholesterol control.     Latest Ref Rng & Units 07/18/2021    8:52 AM 05/18/2021    1:48 PM 04/05/2021    9:10 AM 03/08/2021    9:13 AM 11/16/2020   11:31 AM  Diabetic Labs  HbA1c 4.8 - 5.6 %    6.6  6.2   Chol 100 - 199 mg/dL 183    195    HDL >39 mg/dL 85    84    Calc LDL 0 - 99 mg/dL 90    100    Triglycerides 0 - 149 mg/dL 35    60    Creatinine 0.76 - 1.27 mg/dL 0.91  0.96  0.84  0.89  0.90       10/25/2021    9:44 AM 07/25/2021    9:27 AM 07/20/2021    9:56 AM 07/17/2021   12:53 PM 06/26/2021   11:39 AM 06/13/2021    9:25 AM 06/13/2021    8:58 AM  BP/Weight  Systolic BP 509 326 712 458 099 833 825  Diastolic BP 73 70 90 74 82 70 71  Wt. (Lbs) 123.12 119 121.2  117.6  122.12  BMI 19.28 kg/m2 18.64 kg/m2 18.98 kg/m2  18.42 kg/m2  19.13 kg/m2      Latest Ref Rng & Units 06/06/2020   12:00 AM  Foot/eye exam completion dates  Eye Exam No Retinopathy No Retinopathy No Retinopathy       No Retinopathy         This result is from an external source.   Multiple values from one day are sorted in reverse-chronological order

## 2021-10-27 LAB — LIPID PANEL
Chol/HDL Ratio: 2.6 ratio (ref 0.0–5.0)
Cholesterol, Total: 217 mg/dL — ABNORMAL HIGH (ref 100–199)
HDL: 84 mg/dL (ref >39)
LDL Chol Calc (NIH): 121 mg/dL — ABNORMAL HIGH (ref 0–99)
Triglycerides: 67 mg/dL (ref 0–149)
VLDL Cholesterol Cal: 12 mg/dL (ref 5–40)

## 2021-10-27 LAB — MICROALBUMIN / CREATININE URINE RATIO
Creatinine, Urine: 31.4 mg/dL
Microalb/Creat Ratio: 32 mg/g creat — ABNORMAL HIGH (ref 0–29)
Microalbumin, Urine: 10.2 ug/mL

## 2021-10-27 LAB — CMP14+EGFR
ALT: 12 IU/L (ref 0–44)
AST: 19 IU/L (ref 0–40)
Albumin/Globulin Ratio: 2.1 (ref 1.2–2.2)
Albumin: 4.7 g/dL (ref 3.8–4.8)
Alkaline Phosphatase: 68 IU/L (ref 44–121)
BUN/Creatinine Ratio: 13 (ref 10–24)
BUN: 10 mg/dL (ref 8–27)
Bilirubin Total: 0.4 mg/dL (ref 0.0–1.2)
CO2: 27 mmol/L (ref 20–29)
Calcium: 10 mg/dL (ref 8.6–10.2)
Chloride: 99 mmol/L (ref 96–106)
Creatinine, Ser: 0.79 mg/dL (ref 0.76–1.27)
Globulin, Total: 2.2 g/dL (ref 1.5–4.5)
Glucose: 122 mg/dL — ABNORMAL HIGH (ref 70–99)
Potassium: 3.8 mmol/L (ref 3.5–5.2)
Sodium: 142 mmol/L (ref 134–144)
Total Protein: 6.9 g/dL (ref 6.0–8.5)
eGFR: 94 mL/min/{1.73_m2} (ref >59)

## 2021-10-27 LAB — HEMOGLOBIN A1C
Est. average glucose Bld gHb Est-mCnc: 140 mg/dL
Hgb A1c MFr Bld: 6.5 % — ABNORMAL HIGH (ref 4.8–5.6)

## 2021-10-28 ENCOUNTER — Encounter: Payer: Self-pay | Admitting: Family Medicine

## 2021-10-28 MED ORDER — ROSUVASTATIN CALCIUM 20 MG PO TABS: 20.0000 mg | ORAL_TABLET | Freq: Every day | ORAL | 5 refills | Status: DC

## 2021-10-28 NOTE — Assessment & Plan Note (Signed)
Hyperlipidemia:Low fat diet discussed and encouraged.   Lipid Panel  Lab Results  Component Value Date   CHOL 217 (H) 10/25/2021   HDL 84 10/25/2021   LDLCALC 121 (H) 10/25/2021   TRIG 67 10/25/2021   CHOLHDL 2.6 10/25/2021

## 2021-10-28 NOTE — Progress Notes (Signed)
[name]     [name]: [id number]      DOB: June 30, 1950   HPI [name] is here for follow up and re-evaluation of chronic medical conditions, medication management and review of any available recent lab and radiology data.  Preventive health is updated, specifically  Cancer screening and Immunization.   Questions or concerns regarding consultations or procedures which the PT has had in the interim are  addressed. The PT denies any adverse reactions to current medications since the last visit.  2 week h/o increased difficulty breathing and shortness of breath, no fever , chills or sputum, associated with weatherchange ROS Denies recent fever or chills. Denies sinus pressure, nasal congestion, ear pain or sore throat.  Denies chest pains, palpitations and leg swelling Denies abdominal pain, nausea, vomiting,diarrhea or constipation.   Denies dysuria, frequency, hesitancy or incontinence. Denies joint pain, swelling and limitation in mobility. Denies headaches, seizures, numbness, or tingling. Denies depression, anxiety or insomnia. Denies skin break down or rash.   PE  BP (!) 142/80   Pulse (!) 115   Ht '5\' 7"'$  (1.702 m)   Wt 123 lb 1.9 oz (55.8 kg)   SpO2 93%   BMI 19.28 kg/m   Patient alert and oriented and in no cardiopulmonary distress.  HEENT: No facial asymmetry, EOMI,     Neck supple .  Chest: decreased air entry , few wheezes.  CVS: S1, S2 no murmurs, no S3.Regular rate.  ABD: Soft non tender.   Ext: No edema  MS: Adequate ROM spine, shoulders, hips and knees.  Skin: Intact, no ulcerations or rash noted.  Psych: Good eye contact, normal affect. Memory intact not anxious or depressed appearing.  CNS: CN 2-12 intact, power,  normal throughout.no focal deficits noted.   Assessment & Plan  DM (diabetes mellitus) [facility]) [name] is reminded of the importance of commitment to daily physical activity for 30 minutes or more, as able and the need to limit  carbohydrate intake to 30 to 60 grams per meal to help with blood sugar control.   The need to take medication as prescribed, test blood sugar as directed, and to call between visits if there is a concern that blood sugar is uncontrolled is also discussed.   [name] is reminded of the importance of daily foot exam, annual eye examination, and good blood sugar, blood pressure and cholesterol control.     Latest Ref Rng & Units 07/18/2021    8:52 AM 05/18/2021    1:48 PM 04/05/2021    9:10 AM 03/08/2021    9:13 AM 11/16/2020   11:31 AM  Diabetic Labs  HbA1c 4.8 - 5.6 %    6.6  6.2   Chol 100 - 199 mg/dL 183    195    HDL >39 mg/dL 85    84    Calc LDL 0 - 99 mg/dL 90    100    Triglycerides 0 - 149 mg/dL 35    60    Creatinine 0.76 - 1.27 mg/dL 0.91  0.96  0.84  0.89  0.90       10/25/2021    9:44 AM 07/25/2021    9:27 AM 07/20/2021    9:56 AM 07/17/2021   12:53 PM 06/26/2021   11:39 AM 06/13/2021    9:25 AM 06/13/2021    8:58 AM  BP/Weight  Systolic BP 389 373 428 768 115 726 203  Diastolic BP 73 70 90 74 82 70 71  Wt. (Lbs) 123.12 119 121.2  117.6  122.12  BMI 19.28 kg/m2 18.64 kg/m2 18.98 kg/m2  18.42 kg/m2  19.13 kg/m2      Latest Ref Rng & Units 06/06/2020   12:00 AM  Foot/eye exam completion dates  Eye Exam No Retinopathy No Retinopathy No Retinopathy       No Retinopathy         This result is from an external source.   Multiple values from one day are sorted in reverse-chronological order        Essential hypertension Uncontrolled, inc to amlodipine 0 mg with close f/u DASH diet and commitment to daily physical activity for a minimum of 30 minutes discussed and encouraged, as a part of hypertension management. The importance of attaining a healthy weight is also discussed.     10/25/2021   10:07 AM 10/25/2021    9:44 AM 07/25/2021    9:27 AM 07/20/2021    9:56 AM 07/17/2021   12:53 PM 06/26/2021   11:39 AM 06/13/2021    9:25 AM  BP/Weight  Systolic BP 575 051  833 582 518 984 210  Diastolic BP 80 73 70 90 74 82 70  Wt. (Lbs)  123.12 119 121.2  117.6   BMI  19.28 kg/m2 18.64 kg/m2 18.98 kg/m2  18.42 kg/m2        Asthma with COPD (chronic obstructive pulmonary disease) Current flare, short course of prednisone prescribed  Hyperlipidemia Hyperlipidemia:Low fat diet discussed and encouraged.   Lipid Panel  Lab Results  Component Value Date   CHOL 217 (H) 10/25/2021   HDL 84 10/25/2021   LDLCALC 121 (H) 10/25/2021   TRIG 67 10/25/2021   CHOLHDL 2.6 10/25/2021

## 2021-10-28 NOTE — Assessment & Plan Note (Signed)
Current flare, short course of prednisone prescribed

## 2021-10-28 NOTE — Assessment & Plan Note (Signed)
Uncontrolled, inc to amlodipine 0 mg with close f/u DASH diet and commitment to daily physical activity for a minimum of 30 minutes discussed and encouraged, as a part of hypertension management. The importance of attaining a healthy weight is also discussed.     10/25/2021   10:07 AM 10/25/2021    9:44 AM 07/25/2021    9:27 AM 07/20/2021    9:56 AM 07/17/2021   12:53 PM 06/26/2021   11:39 AM 06/13/2021    9:25 AM  BP/Weight  Systolic BP 100 712 197 588 325 498 264  Diastolic BP 80 73 70 90 74 82 70  Wt. (Lbs)  123.12 119 121.2  117.6   BMI  19.28 kg/m2 18.64 kg/m2 18.98 kg/m2  18.42 kg/m2

## 2021-10-29 ENCOUNTER — Telehealth: Payer: Self-pay

## 2021-10-29 ENCOUNTER — Telehealth: Payer: Self-pay | Admitting: Family Medicine

## 2021-10-29 NOTE — Telephone Encounter (Signed)
Pt returning call

## 2021-10-29 NOTE — Telephone Encounter (Signed)
Patient aware of lab results.

## 2021-10-29 NOTE — Telephone Encounter (Signed)
Patient returning a call. °

## 2021-11-19 ENCOUNTER — Ambulatory Visit: Payer: Medicare HMO | Admitting: Pulmonary Disease

## 2021-11-19 ENCOUNTER — Encounter: Payer: Self-pay | Admitting: Pulmonary Disease

## 2021-11-19 VITALS — BP 130/78 | HR 113 | Temp 98.3°F | Ht 67.0 in | Wt 123.4 lb

## 2021-11-19 DIAGNOSIS — J439 Emphysema, unspecified: Secondary | ICD-10-CM | POA: Diagnosis not present

## 2021-11-19 DIAGNOSIS — Z7952 Long term (current) use of systemic steroids: Secondary | ICD-10-CM

## 2021-11-19 DIAGNOSIS — J4489 Other specified chronic obstructive pulmonary disease: Secondary | ICD-10-CM | POA: Diagnosis not present

## 2021-11-19 DIAGNOSIS — J9611 Chronic respiratory failure with hypoxia: Secondary | ICD-10-CM | POA: Diagnosis not present

## 2021-11-19 DIAGNOSIS — J479 Bronchiectasis, uncomplicated: Secondary | ICD-10-CM

## 2021-11-19 MED ORDER — PREDNISONE 5 MG PO TABS: 5.0000 mg | ORAL_TABLET | Freq: Every day | ORAL | Status: DC

## 2021-11-19 MED ORDER — ALBUTEROL SULFATE HFA 108 (90 BASE) MCG/ACT IN AERS: 2.0000 | INHALATION_SPRAY | Freq: Four times a day (QID) | RESPIRATORY_TRACT | 3 refills | Status: DC | PRN

## 2021-11-19 MED ORDER — PREDNISONE 5 MG PO TABS: 2.5000 mg | ORAL_TABLET | Freq: Every day | ORAL | Status: DC

## 2021-11-19 NOTE — Progress Notes (Addendum)
[facility]  Chief Complaint  Patient presents with   Follow-up    Breathing has been hard today      Constitutional:  BP 130/78   Pulse (!) 113   Temp 98.3 F (36.8 C)   Ht '5\' 7"'$  (1.702 m)   Wt 123 lb 6.4 oz (56 kg)   SpO2 93% Comment: ra  BMI 19.33 kg/m   Past Medical History:  Pneumonia, CVA, Depression, HLD, HTN, Prostate cancer  Past Surgical History:  He  has a past surgical history that includes Colonoscopy; Radioactive seed implant (N/A, 04/09/2021); and SPACE OAR INSTILLATION (N/A, 04/09/2021).  Brief Summary:  [name] is a 71 y.o. male former cigarette and marijuana smoker with COPD with emphysema and bronchiectasis, and chronic hypoxic/hypercapnic respiratory failure.       Subjective:   He doesn't feel like his inhalers helps as much as they should.  He demonstrated his inhaler technique to me today, and wasn't using correctly.  He gets more short of breath when out in cold weather.  He uses his oxygen with activity at home and when sleeping.  Not having chest pain or leg swelling.  Has occasional cough with clear sputum.  Sleeping okay.    Physical Exam:   Appearance - well kempt, thin  ENMT - no sinus tenderness, no oral exudate, no LAN, Mallampati 3 airway, no stridor  Respiratory - decreased breath sounds bilaterally, no wheezing or rales  CV - s1s2 regular rate and rhythm, no murmurs  Ext - no clubbing, no edema  Skin - no rashes  Psych - normal mood and affect      Pulmonary testing:  ABG on 28% FiO2 12/11/18 >> pH 7.385, PCO2 50.2, PO2 102 PFT 07/29/19 >> FEV1 0.63 (24%), FEV1% 32, TLC 9.24 (143%), DLCO 63%  Chest Imaging:  CT chest 07/05/19 >> mild centrilobular and paraseptal emphysema, BTX lower lobes, bulla RLL  Social History:  He  reports that he quit smoking about 6 years ago. His smoking use included cigarettes. He has a 20.00 pack-year smoking history. He has never used smokeless  tobacco. He reports that he does not currently use alcohol after a past usage of about 3.0 standard drinks of alcohol per week. He reports that he does not use drugs.  Family History:  His family history is not on file.     Assessment/Plan:   Severe COPD with emphysema. - previously seen by Dr. Luan Pulling and Dr. Melvyn Novas - has been prednisone dependent since July 2021 - continue prednisone 2.5 mg daily - continue breztri - prn albuterol - demonstrated proper inhaler technique - he has a spacer device but doesn't use it  Bronchiectasis. - likely from prior episodes of pneumonia - prn mucinex and flutter valve  Chronic hypoxic/hypercapnic respiratory failure. - 3 liters with exertion and sleep - uses [employer] for his DME  Vaccinations. - discussed pros/cons about RSV and COVID vaccines; he plans to get these from his pharmacy  Social determinants of health. - he has limited reading ability which impacts his ability to follow through with medical instructions  Time Spent Involved in Patient Care on Day of Examination:  35 minutes  Follow up:   Patient Instructions  Follow up in 4 months  Medication List:   Allergies as of 11/19/2021   No Known Allergies      Medication List        Accurate as of November 19, 2021 10:57 AM.  If you have any questions, ask your nurse or doctor.          albuterol (2.5 MG/3ML) 0.083% nebulizer solution Commonly known as: PROVENTIL Take 3 mLs (2.5 mg total) by nebulization every 6 (six) hours as needed for wheezing or shortness of breath. What changed: Another medication with the same name was changed. Make sure you understand how and when to take each. Changed by: Chesley Mires, MD   albuterol 108 (90 Base) MCG/ACT inhaler Commonly known as: VENTOLIN HFA Inhale 2 puffs into the lungs every 6 (six) hours as needed for wheezing or shortness of breath. What changed: See the new instructions. Changed by: Chesley Mires, MD   amLODipine 10  MG tablet Commonly known as: NORVASC Take 1 tablet (10 mg total) by mouth daily.   aspirin EC 81 MG tablet Take 81 mg by mouth daily. Swallow whole.   Breztri Aerosphere 160-9-4.8 MCG/ACT Aero Generic drug: Budeson-Glycopyrrol-Formoterol Inhale 2 puffs into the lungs in the morning and at bedtime.   metFORMIN 500 MG tablet Commonly known as: GLUCOPHAGE TAKE 1 TABLET TWICE DAILY WITH MEALS   montelukast 10 MG tablet Commonly known as: SINGULAIR Take 1 tablet (10 mg total) by mouth at bedtime.   predniSONE 5 MG tablet Commonly known as: DELTASONE Take 0.5 tablets (2.5 mg total) by mouth daily with breakfast. What changed:  medication strength Another medication with the same name was removed. Continue taking this medication, and follow the directions you see here. Changed by: Chesley Mires, MD   rosuvastatin 20 MG tablet Commonly known as: Crestor Take 1 tablet (20 mg total) by mouth daily.   Simply Saline 0.9 % Aers Generic drug: Saline One spray ionto each nostril, 3 times daily, as needed, for nasal congestion   triamterene-hydrochlorothiazide 37.5-25 MG tablet Commonly known as: MAXZIDE-25 Take one and a half tablets by mouth once daily        Signature:  Chesley Mires, MD [place] Pager - [phone] 11/19/2021, 10:57 AM

## 2021-11-19 NOTE — Addendum Note (Signed)
Addended by: Chesley Mires on: 11/19/2021 10:58 AM   Modules accepted: Orders

## 2021-11-19 NOTE — Patient Instructions (Signed)
Follow up in 4 months 

## 2021-11-20 ENCOUNTER — Ambulatory Visit: Payer: Medicare HMO | Admitting: Urology

## 2021-11-20 DIAGNOSIS — C61 Malignant neoplasm of prostate: Secondary | ICD-10-CM

## 2021-11-28 ENCOUNTER — Telehealth: Payer: Self-pay | Admitting: Pulmonary Disease

## 2021-11-28 MED ORDER — BREZTRI AEROSPHERE 160-9-4.8 MCG/ACT IN AERO: 2.0000 | INHALATION_SPRAY | Freq: Two times a day (BID) | RESPIRATORY_TRACT | 0 refills | Status: DC

## 2021-11-28 NOTE — Telephone Encounter (Signed)
ATC patient and received busy signal X2.  Samples left at front with [employer] application for pt assistance. Nothing further needed at this time.

## 2021-11-28 NOTE — Telephone Encounter (Signed)
Called patient and let him know that I got him one box of breztri. And it will be upfront for him at the office. Nothing further needed

## 2021-11-28 NOTE — Telephone Encounter (Signed)
Patient called to request some samples for his Breztri.  Please call patient to let him know if he can pick some up.  CB# [phone]

## 2021-12-11 ENCOUNTER — Ambulatory Visit (INDEPENDENT_AMBULATORY_CARE_PROVIDER_SITE_OTHER): Payer: Medicare HMO | Admitting: Family Medicine

## 2021-12-11 ENCOUNTER — Encounter: Payer: Self-pay | Admitting: Family Medicine

## 2021-12-11 VITALS — BP 124/72 | HR 105 | Ht 67.0 in | Wt 121.0 lb

## 2021-12-11 DIAGNOSIS — E785 Hyperlipidemia, unspecified: Secondary | ICD-10-CM

## 2021-12-11 DIAGNOSIS — E559 Vitamin D deficiency, unspecified: Secondary | ICD-10-CM | POA: Diagnosis not present

## 2021-12-11 DIAGNOSIS — J4489 Other specified chronic obstructive pulmonary disease: Secondary | ICD-10-CM

## 2021-12-11 DIAGNOSIS — I10 Essential (primary) hypertension: Secondary | ICD-10-CM | POA: Diagnosis not present

## 2021-12-11 DIAGNOSIS — E119 Type 2 diabetes mellitus without complications: Secondary | ICD-10-CM | POA: Diagnosis not present

## 2021-12-11 MED ORDER — AMLODIPINE BESYLATE 10 MG PO TABS: 10.0000 mg | ORAL_TABLET | Freq: Every day | ORAL | 3 refills | Status: DC

## 2021-12-11 NOTE — Progress Notes (Signed)
[name]     [name]: [id number]      DOB: [birth year]   HPI [name] is here for follow up and re-evaluation of chronic medical conditions, in particular uncontrolled hypertension,medication management and review of any available recent lab and radiology data.  Preventive health is updated, specifically  Cancer screening and Immunization.   Questions or concerns regarding consultations or procedures which the PT has had in the interim are  addressed. The PT denies any adverse reactions to current medications since the last visit.  There are no new concerns.  There are no specific complaints   ROS Denies recent fever or chills. Denies sinus pressure, nasal congestion, ear pain or sore throat. Denies chest congestion, productive cough or wheezing. Denies chest pains, palpitations and leg swelling Denies abdominal pain, nausea, vomiting,diarrhea or constipation.   Denies dysuria, frequency, hesitancy or incontinence. Denies joint pain, swelling and limitation in mobility. Denies headaches, seizures, numbness, or tingling. Denies depression, anxiety or insomnia. Denies skin break down or rash.   PE  BP 124/72   Pulse (!) 105   Ht '5\' 7"'$  (1.702 m)   Wt 121 lb (54.9 kg)   SpO2 92%   BMI 18.95 kg/m   Patient alert and oriented and in no cardiopulmonary distress.  HEENT: No facial asymmetry, EOMI,     Neck supple .  Chest: Clear to auscultation bilaterally.decrfeased air entry  CVS: S1, S2 no murmurs, no S3.Regular rate.  ABD: Soft non tender.   Ext: No edema  MS: Adequate ROM spine, shoulders, hips and knees.  Skin: Intact, no ulcerations or rash noted.  Psych: Good eye contact, normal affect. Memory intact not anxious or depressed appearing.  CNS: CN 2-12 intact, power,  normal throughout.no focal deficits noted.   Assessment & Plan  Essential hypertension DASH diet and commitment to daily physical activity for a minimum of 30 minutes discussed and encouraged,  as a part of hypertension management. The importance of attaining a healthy weight is also discussed.     12/11/2021   11:01 AM 12/11/2021   10:51 AM 12/11/2021   10:48 AM 11/19/2021   10:40 AM 10/25/2021   10:07 AM 10/25/2021    9:44 AM 07/25/2021    9:27 AM  BP/Weight  Systolic BP 734 193 790 240 973 532 992  Diastolic BP 72 82 86 78 80 73 70  Wt. (Lbs)   121 123.4  123.12 119  BMI   18.95 kg/m2 19.33 kg/m2  19.28 kg/m2 18.64 kg/m2       Asthma with COPD (chronic obstructive pulmonary disease) Currently stable and controlled , management per pulmonary  DM (diabetes mellitus) ([place]) [name] is reminded of the importance of commitment to daily physical activity for 30 minutes or more, as able and the need to limit carbohydrate intake to 30 to 60 grams per meal to help with blood sugar control.   The need to take medication as prescribed, test blood sugar as directed, and to call between visits if there is a concern that blood sugar is uncontrolled is also discussed.   [name] is reminded of the importance of daily foot exam, annual eye examination, and good blood sugar, blood pressure and cholesterol control.     Latest Ref Rng & Units 10/25/2021   10:30 AM 07/18/2021    8:52 AM 05/18/2021    1:48 PM 04/05/2021    9:10 AM 03/08/2021    9:13 AM  Diabetic Labs  HbA1c 4.8 -  5.6 % 6.5     6.6   Micro/Creat Ratio 0 - 29 mg/g creat 32       Chol 100 - 199 mg/dL 217  183    195   HDL >39 mg/dL 84  85    84   Calc LDL 0 - 99 mg/dL 121  90    100   Triglycerides 0 - 149 mg/dL 67  35    60   Creatinine 0.76 - 1.27 mg/dL 0.79  0.91  0.96  0.84  0.89       12/11/2021   11:01 AM 12/11/2021   10:51 AM 12/11/2021   10:48 AM 11/19/2021   10:40 AM 10/25/2021   10:07 AM 10/25/2021    9:44 AM 07/25/2021    9:27 AM  BP/Weight  Systolic BP 951 884 166 063 016 010 932  Diastolic BP 72 82 86 78 80 73 70  Wt. (Lbs)   121 123.4  123.12 119  BMI   18.95 kg/m2 19.33 kg/m2  19.28 kg/m2 18.64  kg/m2      Latest Ref Rng & Units 10/25/2021    9:40 AM 06/06/2020   12:00 AM  Foot/eye exam completion dates  Eye Exam No Retinopathy No Retinopathy  No Retinopathy       No Retinopathy      Foot Form Completion  Done      This result is from an external source.   Multiple values from one day are sorted in reverse-chronological order      Updated lab needed at/ before next visit.   Hyperlipidemia LDL goal <100 Hyperlipidemia:Low fat diet discussed and encouraged.   Lipid Panel  Lab Results  Component Value Date   CHOL 217 (H) 10/25/2021   HDL 84 10/25/2021   LDLCALC 121 (H) 10/25/2021   TRIG 67 10/25/2021   CHOLHDL 2.6 10/25/2021     Notat goal Updated lab needed at/ before next visit.

## 2021-12-11 NOTE — Assessment & Plan Note (Signed)
Currently stable and controlled , management per pulmonary

## 2021-12-11 NOTE — Patient Instructions (Addendum)
Annual exam in February for or after call if you need me sooner.  Lung cancer screening needs to be scheduled nurse please follow-up on this.  Excellent blood pressure no change in medicine.  Fasting lipid CMP and EGFR , tSH, cBC, vit D and HbA1c  3 to 5 days before your next visit.  Need covid, and RSV vaccines at your pharmacy  Thanks for choosing [facility], we consider it a privelige to serve you.

## 2021-12-11 NOTE — Assessment & Plan Note (Signed)
DASH diet and commitment to daily physical activity for a minimum of 30 minutes discussed and encouraged, as a part of hypertension management. The importance of attaining a healthy weight is also discussed.     12/11/2021   11:01 AM 12/11/2021   10:51 AM 12/11/2021   10:48 AM 11/19/2021   10:40 AM 10/25/2021   10:07 AM 10/25/2021    9:44 AM 07/25/2021    9:27 AM  BP/Weight  Systolic BP 124 580 998 338 250 539 767  Diastolic BP 72 82 86 78 80 73 70  Wt. (Lbs)   121 123.4  123.12 119  BMI   18.95 kg/m2 19.33 kg/m2  19.28 kg/m2 18.64 kg/m2

## 2021-12-11 NOTE — Assessment & Plan Note (Signed)
Hyperlipidemia:Low fat diet discussed and encouraged.   Lipid Panel  Lab Results  Component Value Date   CHOL 217 (H) 10/25/2021   HDL 84 10/25/2021   LDLCALC 121 (H) 10/25/2021   TRIG 67 10/25/2021   CHOLHDL 2.6 10/25/2021     Notat goal Updated lab needed at/ before next visit.

## 2021-12-11 NOTE — Assessment & Plan Note (Signed)
[name] is reminded of the importance of commitment to daily physical activity for 30 minutes or more, as able and the need to limit carbohydrate intake to 30 to 60 grams per meal to help with blood sugar control.   The need to take medication as prescribed, test blood sugar as directed, and to call between visits if there is a concern that blood sugar is uncontrolled is also discussed.   [name] is reminded of the importance of daily foot exam, annual eye examination, and good blood sugar, blood pressure and cholesterol control.     Latest Ref Rng & Units 10/25/2021   10:30 AM 07/18/2021    8:52 AM 05/18/2021    1:48 PM 04/05/2021    9:10 AM 03/08/2021    9:13 AM  Diabetic Labs  HbA1c 4.8 - 5.6 % 6.5     6.6   Micro/Creat Ratio 0 - 29 mg/g creat 32       Chol 100 - 199 mg/dL 217  183    195   HDL >39 mg/dL 84  85    84   Calc LDL 0 - 99 mg/dL 121  90    100   Triglycerides 0 - 149 mg/dL 67  35    60   Creatinine 0.76 - 1.27 mg/dL 0.79  0.91  0.96  0.84  0.89       12/11/2021   11:01 AM 12/11/2021   10:51 AM 12/11/2021   10:48 AM 11/19/2021   10:40 AM 10/25/2021   10:07 AM 10/25/2021    9:44 AM 07/25/2021    9:27 AM  BP/Weight  Systolic BP 449 201 007 121 975 883 254  Diastolic BP 72 82 86 78 80 73 70  Wt. (Lbs)   121 123.4  123.12 119  BMI   18.95 kg/m2 19.33 kg/m2  19.28 kg/m2 18.64 kg/m2      Latest Ref Rng & Units 10/25/2021    9:40 AM 06/06/2020   12:00 AM  Foot/eye exam completion dates  Eye Exam No Retinopathy No Retinopathy  No Retinopathy       No Retinopathy      Foot Form Completion  Done      This result is from an external source.   Multiple values from one day are sorted in reverse-chronological order      Updated lab needed at/ before next visit.

## 2021-12-17 ENCOUNTER — Ambulatory Visit (INDEPENDENT_AMBULATORY_CARE_PROVIDER_SITE_OTHER): Payer: Medicare HMO

## 2021-12-17 DIAGNOSIS — Z0000 Encounter for general adult medical examination without abnormal findings: Secondary | ICD-10-CM | POA: Diagnosis not present

## 2021-12-17 DIAGNOSIS — Z122 Encounter for screening for malignant neoplasm of respiratory organs: Secondary | ICD-10-CM | POA: Diagnosis not present

## 2021-12-17 NOTE — Progress Notes (Signed)
Subjective:   [name] is a 71 y.o. male who presents for Medicare Annual/Subsequent preventive examination. I connected with  [name] on 12/17/21 by a audio enabled telemedicine application and verified that I am speaking with the correct person using two identifiers.  Patient Location: Home  Provider Location: Office/Clinic  I discussed the limitations of evaluation and management by telemedicine. The patient expressed understanding and agreed to proceed.     Review of Systems     [name] , Thank you for taking time to come for your Medicare Wellness Visit. I appreciate your ongoing commitment to your health goals. Please review the following plan we discussed and let me know if I can assist you in the future.   These are the goals we discussed:  Goals      Patient Stated     To be living next year        This is a list of the screening recommended for you and due dates:  Health Maintenance  Topic Date Due   DTaP/Tdap/Td vaccine (2 - Tdap) 09/21/2013   Screening for Lung Cancer  07/04/2020   COVID-19 Vaccine (6 - 2023-24 season) 09/07/2021   Hemoglobin A1C  04/26/2022   Yearly kidney function blood test for diabetes  10/26/2022   Yearly kidney health urinalysis for diabetes  10/26/2022   Medicare Annual Wellness Visit  12/18/2022   Cologuard (Stool DNA test)  12/02/2023   Pneumonia Vaccine  Completed   Flu Shot  Completed   Hepatitis C Screening: USPSTF Recommendation to screen - Ages 18-79 yo.  Completed   Zoster (Shingles) Vaccine  Completed   HPV Vaccine  Aged Out   Colon Cancer Screening  Discontinued          Objective:    There were no vitals filed for this visit. There is no height or weight on file to calculate BMI.     05/18/2021    1:39 PM 05/04/2021    9:36 AM 04/09/2021    8:45 PM 04/05/2021    9:12 AM 02/13/2021    8:54 AM 12/01/2019    2:36 PM 07/29/2019    5:53 PM  Advanced Directives  Does Patient Have a Medical Advance  Directive? _0  No No  Would patient like information on creating a medical advance directive? No - Patient declined Yes (ED - Information included in AVS) No - Patient declined No - Patient declined  No - Patient declined     Current Medications (verified) Outpatient Encounter Medications as of 12/17/2021  Medication Sig   albuterol (PROVENTIL) (2.5 MG/3ML) 0.083% nebulizer solution Take 3 mLs (2.5 mg total) by nebulization every 6 (six) hours as needed for wheezing or shortness of breath.   albuterol (VENTOLIN HFA) 108 (90 Base) MCG/ACT inhaler Inhale 2 puffs into the lungs every 6 (six) hours as needed for wheezing or shortness of breath.   amLODipine (NORVASC) 10 MG tablet Take 1 tablet (10 mg total) by mouth daily.   aspirin EC 81 MG tablet Take 81 mg by mouth daily. Swallow whole.   Budeson-Glycopyrrol-Formoterol (BREZTRI AEROSPHERE) 160-9-4.8 MCG/ACT AERO Inhale 2 puffs into the lungs in the morning and at bedtime.   metFORMIN (GLUCOPHAGE) 500 MG tablet TAKE 1 TABLET TWICE DAILY WITH MEALS   montelukast (SINGULAIR) 10 MG tablet Take 1 tablet (10 mg total) by mouth at bedtime.   predniSONE (DELTASONE) 5 MG tablet Take 0.5 tablets (2.5 mg total) by mouth daily  with breakfast.   rosuvastatin (CRESTOR) 20 MG tablet Take 1 tablet (20 mg total) by mouth daily.   Saline (SIMPLY SALINE) 0.9 % AERS One spray ionto each nostril, 3 times daily, as needed, for nasal congestion   triamterene-hydrochlorothiazide (MAXZIDE-25) 37.5-25 MG tablet Take one and a half tablets by mouth once daily   [DISCONTINUED] simvastatin (ZOCOR) 40 MG tablet Take 40 mg by mouth daily.   No facility-administered encounter medications on file as of 12/17/2021.    Allergies (verified) Patient has no known allergies.   History: Past Medical History:  Diagnosis Date   Asthma    CAP (community acquired pneumonia) 12/10/2018   COPD (chronic obstructive pulmonary disease) ([place])    COPD with acute exacerbation  ([place]) 04/18/2018   CVA (cerebral vascular accident) ([place]) 2008   with temporary vision loss    Depression    ECHOCARDIOGRAM, ABNORMAL 03/06/2010   Qualifier: Diagnosis of  By: Purcell Nails, NP, Kathryn     Elevated PSA 2014   no diagnosis pf prostate cancer in 07/2014   History of substance abuse ([place]) 01/14/2011   Marijuana   History of substance abuse ([place]) 01/14/2011   History of tobacco abuse 01/14/2011   Hyperlipidemia    Hypertension    Lobar pneumonia ([place]) 12/13/2018   Marijuana abuse    Nicotine addiction    Prediabetes 2014   Past Surgical History:  Procedure Laterality Date   COLONOSCOPY     RADIOACTIVE SEED IMPLANT N/A 04/09/2021   Procedure: RADIOACTIVE SEED IMPLANT/BRACHYTHERAPY IMPLANT/64 SEEDS;  Surgeon: [name], MD;  Location: [facility];  Service: Urology;  Laterality: N/A;  [place] N/A 04/09/2021   Procedure: SPACE OAR INSTILLATION;  Surgeon: [name], MD;  Location: [facility];  Service: Urology;  Laterality: N/A;   No family history on file. Social History   Socioeconomic History   Marital status: Widowed    Spouse name: Not on file   Number of children: 1   Years of education: 55 the grade   Highest education level: 11th grade  Occupational History   Occupation: unemployed, retired  Tobacco Use   Smoking status: Former    Packs/day: 0.50    Years: 40.00    Total pack years: 20.00    Types: Cigarettes    Quit date: 04/14/2015    Years since quitting: 6.6   Smokeless tobacco: Never  Vaping Use   Vaping Use: Never used  Substance and Sexual Activity   Alcohol use: Not Currently    Alcohol/week: 3.0 standard drinks of alcohol    Types: 3 Cans of beer per week    Comment: occasionally   Drug use: No    Comment: pt quit 20 years ago   Sexual activity: Not Currently  Other Topics Concern   Not on file  Social History Narrative   Not on file   Social Determinants of Health   Financial Resource Strain: Low Risk   (12/07/2020)   Overall Financial Resource Strain (CARDIA)    Difficulty of Paying Living Expenses: Not very hard  Food Insecurity: No Food Insecurity (12/07/2020)   Hunger Vital Sign    Worried About Running Out of Food in the Last Year: Never true    Ran Out of Food in the Last Year: Never true  Transportation Needs: No Transportation Needs (12/07/2020)   PRAPARE - Hydrologist (Medical): No    Lack of Transportation (Non-Medical): No  Physical Activity: Inactive (12/07/2020)  Exercise Vital Sign    Days of Exercise per Week: 0 days    Minutes of Exercise per Session: 0 min  Stress: No Stress Concern Present (02/24/2020)   [place]    Feeling of Stress : Only a little  Recent Concern: Stress - Stress Concern Present (12/01/2019)   [place]    Feeling of Stress : Rather much  Social Connections: Unknown (12/07/2020)   Social Connection and Isolation Panel [NHANES]    Frequency of Communication with Friends and Family: Once a week    Frequency of Social Gatherings with Friends and Family: Never    Attends Religious Services: Not on Advertising copywriter or Organizations: No    Attends Archivist Meetings: Never    Marital Status: Widowed    Tobacco Counseling Counseling given: Not Answered   Clinical Intake:                 Diabetic? No         Activities of Daily Living    04/09/2021    8:45 PM 04/05/2021    9:15 AM  In your present state of health, do you have any difficulty performing the following activities:  Hearing? 0   Vision? 0   Difficulty concentrating or making decisions? 1   Walking or climbing stairs? 0   Dressing or bathing? 0   Doing errands, shopping? 0 0    Patient Care Team: Fayrene Helper, MD as PCP - General Irine Seal, MD as Attending Physician  (Urology) Gala Romney, Cristopher Estimable, MD as Consulting Physician (Gastroenterology) Danie Binder, MD (Inactive) as Consulting Physician (Gastroenterology) Perlie Mayo, NP as Nurse Practitioner (Family Medicine) Tanda Rockers, MD as Consulting Physician (Pulmonary Disease) Katheren Puller, RN as Oncology Nurse Navigator [name], MD as Consulting Physician (Urology) Tyler Pita, MD as Consulting Physician (Radiation Oncology) Delice Bison Charlestine Massed, NP as Nurse Practitioner (Hematology and Oncology) Harmon Pier, RN as Registered Nurse  Indicate any recent Medical Services you may have received from other than [facility] providers in the past year (date may be approximate).     Assessment:   This is a routine wellness examination for [name].  Hearing/Vision screen No results found.  Dietary issues and exercise activities discussed:     Goals Addressed   None   Depression Screen    12/11/2021   10:50 AM 10/25/2021    9:45 AM 07/25/2021    9:29 AM 02/09/2021    1:09 PM 11/16/2020   10:47 AM 04/05/2020    9:42 AM 02/24/2020   10:37 AM  PHQ 2/9 Scores  PHQ - 2 Score 0 0 0 0 0 0 0  PHQ- 9 Score     0      Fall Risk    12/11/2021   10:50 AM 10/25/2021    9:44 AM 06/13/2021    8:57 AM 02/09/2021    1:09 PM 12/07/2020    9:05 AM  Fall Risk   Falls in the past year? 0 0 0 0 0  Number falls in past yr: 0 0 0 0 0  Injury with Fall? 0 0 0 0 0  Risk for fall due to : No Fall Risks No Fall Risks     Follow up Falls evaluation completed Falls evaluation completed       [place]:  Any stairs in or around the home? No  If so, are there any without handrails?  N/A Home free of loose throw rugs in walkways, pet beds, electrical cords, etc? Yes  Adequate lighting in your home to reduce risk of falls? Yes   ASSISTIVE DEVICES UTILIZED TO PREVENT FALLS:  Life alert? No  Use of a cane, walker or w/c? No  Grab bars in the bathroom? No  Shower  chair or bench in shower? No  Elevated toilet seat or a handicapped toilet? No    Cognitive Function:    03/04/2016    3:28 PM  MMSE - Mini Mental State Exam  Orientation to time 2  Orientation to Place 5  Registration 3  Attention/ Calculation 0  Attention/Calculation-comments unable to spell  Recall 3  Language- name 2 objects 2  Language- repeat 1  Language- follow 3 step command 3  Language- read & follow direction 0  Language-read & follow direction-comments unable to read and write  Write a sentence 1  Copy design 1  Total score 21        12/07/2020    9:19 AM 12/01/2019    2:38 PM 11/30/2018   10:49 AM 03/24/2018    9:41 AM  6CIT Screen  What Year? 0 points 0 points 0 points 0 points  What month? 0 points 0 points 3 points 0 points  What time? 0 points 0 points 0 points 0 points  Count back from 20 0 points 0 points 0 points 2 points  Months in reverse 2 points 4 points 4 points 4 points  Repeat phrase 2 points 2 points 2 points 2 points  Total Score 4 points 6 points 9 points 8 points    Immunizations Immunization History  Administered Date(s) Administered   Fluad Quad(high Dose 65+) 08/31/2018, 10/15/2019, 09/12/2020, 10/25/2021   Influenza Split 10/23/2011   Influenza Whole 10/02/2010   Influenza,inj,Quad PF,6+ Mos 11/11/2012, 11/10/2013, 10/11/2014, 08/30/2015, 09/19/2016, 08/29/2017   Moderna Covid-19 Vaccine Bivalent Booster 18yr & up 11/17/2020   Moderna SARS-COV2 Booster Vaccination 10/01/2019   Moderna Sars-Covid-2 Vaccination 03/03/2019, 03/31/2019, 12/14/2019, 07/07/2020   Pneumococcal Conjugate-13 03/28/2014   Pneumococcal Polysaccharide-23 05/30/2010, 06/19/2015   Td 09/22/2003   Zoster Recombinat (Shingrix) 11/20/2018, 05/11/2019    TDAP status: Due, Education has been provided regarding the importance of this vaccine. Advised may receive this vaccine at local pharmacy or Health Dept. Aware to provide a copy of the vaccination record if  obtained from local pharmacy or Health Dept. Verbalized acceptance and understanding.  Flu Vaccine status: Up to date  Pneumococcal vaccine status: Up to date  Covid-19 vaccine status: Completed vaccines  Qualifies for Shingles Vaccine? Yes   Zostavax completed Yes   Shingrix Completed?: Yes  Screening Tests Health Maintenance  Topic Date Due   DTaP/Tdap/Td (2 - Tdap) 09/21/2013   Lung Cancer Screening  07/04/2020   COVID-19 Vaccine (6 - 2023-24 season) 09/07/2021   HEMOGLOBIN A1C  04/26/2022   Diabetic kidney evaluation - eGFR measurement  10/26/2022   Diabetic kidney evaluation - Urine ACR  10/26/2022   Medicare Annual Wellness (AWV)  12/18/2022   Fecal DNA (Cologuard)  12/02/2023   Pneumonia Vaccine 71 Years old  Completed   INFLUENZA VACCINE  Completed   Hepatitis C Screening  Completed   Zoster Vaccines- Shingrix  Completed   HPV VACCINES  Aged Out   COLONOSCOPY (Pts 45-422yrInsurance coverage will need to be confirmed)  Discontinued    Health Maintenance  Health Maintenance Due  Topic Date Due   DTaP/Tdap/Td (2 - Tdap) 09/21/2013   Lung Cancer Screening  07/04/2020   COVID-19 Vaccine (6 - 2023-24 season) 09/07/2021    Colorectal cancer screening: Type of screening: Cologuard. Completed 12/01/2020. Repeat every 3 years  Lung Cancer Screening: (Low Dose CT Chest recommended if Age 73-80 years, 30 pack-year currently smoking OR have quit w/in 15years.) does qualify.   Lung Cancer Screening Referral: yes  Additional Screening:  Hepatitis C Screening: does qualify; Completed 03/16/2015  Vision Screening: Recommended annual ophthalmology exams for early detection of glaucoma and other disorders of the eye. Is the patient up to date with their annual eye exam?  No  Who is the provider or what is the name of the office in which the patient attends annual eye exams? N/A If pt is not established with a provider, would they like to be referred to a provider to  establish care? No .   Dental Screening: Recommended annual dental exams for proper oral hygiene  Community Resource Referral / Chronic Care Management: CRR required this visit?  No   CCM required this visit?  No      Plan:     I have personally reviewed and noted the following in the patient's chart:   Medical and social history Use of alcohol, tobacco or illicit drugs  Current medications and supplements including opioid prescriptions. Patient is not currently taking opioid prescriptions. Functional ability and status Nutritional status Physical activity Advanced directives List of other physicians Hospitalizations, surgeries, and ER visits in previous 12 months Vitals Screenings to include cognitive, depression, and falls Referrals and appointments  In addition, I have reviewed and discussed with patient certain preventive protocols, quality metrics, and best practice recommendations. A written personalized care plan for preventive services as well as general preventive health recommendations were provided to patient.     [name], [place]   12/17/2021   Nurse Notes:  [name] , Thank you for taking time to come for your Medicare Wellness Visit. I appreciate your ongoing commitment to your health goals. Please review the following plan we discussed and let me know if I can assist you in the future.   These are the goals we discussed:  Goals      Patient Stated     To be living next year        This is a list of the screening recommended for you and due dates:  Health Maintenance  Topic Date Due   DTaP/Tdap/Td vaccine (2 - Tdap) 09/21/2013   Screening for Lung Cancer  07/04/2020   COVID-19 Vaccine (6 - 2023-24 season) 09/07/2021   Hemoglobin A1C  04/26/2022   Yearly kidney function blood test for diabetes  10/26/2022   Yearly kidney health urinalysis for diabetes  10/26/2022   Medicare Annual Wellness Visit  12/18/2022   Cologuard (Stool DNA test)   12/02/2023   Pneumonia Vaccine  Completed   Flu Shot  Completed   Hepatitis C Screening: USPSTF Recommendation to screen - Ages 18-79 yo.  Completed   Zoster (Shingles) Vaccine  Completed   HPV Vaccine  Aged Out   Colon Cancer Screening  Discontinued

## 2021-12-17 NOTE — Patient Instructions (Signed)
[name] , Thank you for taking time to come for your Medicare Wellness Visit. I appreciate your ongoing commitment to your health goals. Please review the following plan we discussed and let me know if I can assist you in the future.   Screening recommendations/referrals: Colonoscopy: Completed Recommended yearly ophthalmology/optometry visit for glaucoma screening and checkup Recommended yearly dental visit for hygiene and checkup  Vaccinations: Influenza vaccine: Completed Pneumococcal vaccine: Completed Tdap vaccine: Due Shingles vaccine: Completed    Advanced directives: Patient declined  Conditions/risks identified: Falls, hypertension  Next appointment: 1 year  Preventive Care 24 Years and Older, Male Preventive care refers to lifestyle choices and visits with your health care provider that can promote health and wellness. What does preventive care include? A yearly physical exam. This is also called an annual well check. Dental exams once or twice a year. Routine eye exams. Ask your health care provider how often you should have your eyes checked. Personal lifestyle choices, including: Daily care of your teeth and gums. Regular physical activity. Eating a healthy diet. Avoiding tobacco and drug use. Limiting alcohol use. Practicing safe sex. Taking low doses of aspirin every day. Taking vitamin and mineral supplements as recommended by your health care provider. What happens during an annual well check? The services and screenings done by your health care provider during your annual well check will depend on your age, overall health, lifestyle risk factors, and family history of disease. Counseling  Your health care provider may ask you questions about your: Alcohol use. Tobacco use. Drug use. Emotional well-being. Home and relationship well-being. Sexual activity. Eating habits. History of falls. Memory and ability to understand (cognition). Work and work  Statistician. Screening  You may have the following tests or measurements: Height, weight, and BMI. Blood pressure. Lipid and cholesterol levels. These may be checked every 5 years, or more frequently if you are over 32 years old. Skin check. Lung cancer screening. You may have this screening every year starting at age 12 if you have a 30-pack-year history of smoking and currently smoke or have quit within the past 15 years. Fecal occult blood test (FOBT) of the stool. You may have this test every year starting at age 22. Flexible sigmoidoscopy or colonoscopy. You may have a sigmoidoscopy every 5 years or a colonoscopy every 10 years starting at age 71. Prostate cancer screening. Recommendations will vary depending on your family history and other risks. Hepatitis C blood test. Hepatitis B blood test. Sexually transmitted disease (STD) testing. Diabetes screening. This is done by checking your blood sugar (glucose) after you have not eaten for a while (fasting). You may have this done every 1-3 years. Abdominal aortic aneurysm (AAA) screening. You may need this if you are a current or former smoker. Osteoporosis. You may be screened starting at age 44 if you are at high risk. Talk with your health care provider about your test results, treatment options, and if necessary, the need for more tests. Vaccines  Your health care provider may recommend certain vaccines, such as: Influenza vaccine. This is recommended every year. Tetanus, diphtheria, and acellular pertussis (Tdap, Td) vaccine. You may need a Td booster every 10 years. Zoster vaccine. You may need this after age 44. Pneumococcal 13-valent conjugate (PCV13) vaccine. One dose is recommended after age 9. Pneumococcal polysaccharide (PPSV23) vaccine. One dose is recommended after age 52. Talk to your health care provider about which screenings and vaccines you need and how often you need them. This information  is not intended to replace  advice given to you by your health care provider. Make sure you discuss any questions you have with your health care provider. Document Released: 01/20/2015 Document Revised: 09/13/2015 Document Reviewed: 10/25/2014 Elsevier Interactive Patient Education  2017 [place] Prevention in the Home Falls can cause injuries. They can happen to people of all ages. There are many things you can do to make your home safe and to help prevent falls. What can I do on the outside of my home? Regularly fix the edges of walkways and driveways and fix any cracks. Remove anything that might make you trip as you walk through a door, such as a raised step or threshold. Trim any bushes or trees on the path to your home. Use bright outdoor lighting. Clear any walking paths of anything that might make someone trip, such as rocks or tools. Regularly check to see if handrails are loose or broken. Make sure that both sides of any steps have handrails. Any raised decks and porches should have guardrails on the edges. Have any leaves, snow, or ice cleared regularly. Use sand or salt on walking paths during winter. Clean up any spills in your garage right away. This includes oil or grease spills. What can I do in the bathroom? Use night lights. Install grab bars by the toilet and in the tub and shower. Do not use towel bars as grab bars. Use non-skid mats or decals in the tub or shower. If you need to sit down in the shower, use a plastic, non-slip stool. Keep the floor dry. Clean up any water that spills on the floor as soon as it happens. Remove soap buildup in the tub or shower regularly. Attach bath mats securely with double-sided non-slip rug tape. Do not have throw rugs and other things on the floor that can make you trip. What can I do in the bedroom? Use night lights. Make sure that you have a light by your bed that is easy to reach. Do not use any sheets or blankets that are too big for your bed.  They should not hang down onto the floor. Have a firm chair that has side arms. You can use this for support while you get dressed. Do not have throw rugs and other things on the floor that can make you trip. What can I do in the kitchen? Clean up any spills right away. Avoid walking on wet floors. Keep items that you use a lot in easy-to-reach places. If you need to reach something above you, use a strong step stool that has a grab bar. Keep electrical cords out of the way. Do not use floor polish or wax that makes floors slippery. If you must use wax, use non-skid floor wax. Do not have throw rugs and other things on the floor that can make you trip. What can I do with my stairs? Do not leave any items on the stairs. Make sure that there are handrails on both sides of the stairs and use them. Fix handrails that are broken or loose. Make sure that handrails are as long as the stairways. Check any carpeting to make sure that it is firmly attached to the stairs. Fix any carpet that is loose or worn. Avoid having throw rugs at the top or bottom of the stairs. If you do have throw rugs, attach them to the floor with carpet tape. Make sure that you have a light switch at the top of  the stairs and the bottom of the stairs. If you do not have them, ask someone to add them for you. What else can I do to help prevent falls? Wear shoes that: Do not have high heels. Have rubber bottoms. Are comfortable and fit you well. Are closed at the toe. Do not wear sandals. If you use a stepladder: Make sure that it is fully opened. Do not climb a closed stepladder. Make sure that both sides of the stepladder are locked into place. Ask someone to hold it for you, if possible. Clearly mark and make sure that you can see: Any grab bars or handrails. First and last steps. Where the edge of each step is. Use tools that help you move around (mobility aids) if they are needed. These  include: Canes. Walkers. Scooters. Crutches. Turn on the lights when you go into a dark area. Replace any light bulbs as soon as they burn out. Set up your furniture so you have a clear path. Avoid moving your furniture around. If any of your floors are uneven, fix them. If there are any pets around you, be aware of where they are. Review your medicines with your doctor. Some medicines can make you feel dizzy. This can increase your chance of falling. Ask your doctor what other things that you can do to help prevent falls. This information is not intended to replace advice given to you by your health care provider. Make sure you discuss any questions you have with your health care provider. Document Released: 10/20/2008 Document Revised: 06/01/2015 Document Reviewed: 01/28/2014 Elsevier Interactive Patient Education  2017 [employer].

## 2021-12-20 ENCOUNTER — Other Ambulatory Visit: Payer: Self-pay | Admitting: Family Medicine

## 2021-12-20 DIAGNOSIS — E119 Type 2 diabetes mellitus without complications: Secondary | ICD-10-CM

## 2022-01-18 ENCOUNTER — Other Ambulatory Visit: Payer: Self-pay

## 2022-01-18 DIAGNOSIS — J449 Chronic obstructive pulmonary disease, unspecified: Secondary | ICD-10-CM

## 2022-01-18 MED ORDER — BREZTRI AEROSPHERE 160-9-4.8 MCG/ACT IN AERO: 2.0000 | INHALATION_SPRAY | Freq: Two times a day (BID) | RESPIRATORY_TRACT | 0 refills | Status: DC

## 2022-01-22 ENCOUNTER — Ambulatory Visit (HOSPITAL_COMMUNITY)
Admission: RE | Admit: 2022-01-22 | Discharge: 2022-01-22 | Disposition: A | Discharge status: Home / Self Care | Payer: Medicare HMO | Source: Ambulatory Visit | Attending: Internal Medicine | Admitting: Internal Medicine

## 2022-01-22 DIAGNOSIS — Z122 Encounter for screening for malignant neoplasm of respiratory organs: Secondary | ICD-10-CM

## 2022-01-22 DIAGNOSIS — J439 Emphysema, unspecified: Secondary | ICD-10-CM | POA: Insufficient documentation

## 2022-01-22 DIAGNOSIS — Z87891 Personal history of nicotine dependence: Secondary | ICD-10-CM | POA: Diagnosis not present

## 2022-01-22 DIAGNOSIS — I2510 Atherosclerotic heart disease of native coronary artery without angina pectoris: Secondary | ICD-10-CM | POA: Insufficient documentation

## 2022-01-22 DIAGNOSIS — I700 Atherosclerosis of aorta: Secondary | ICD-10-CM | POA: Diagnosis not present

## 2022-01-24 ENCOUNTER — Telehealth: Payer: Self-pay

## 2022-01-24 NOTE — Telephone Encounter (Signed)
Opened in error

## 2022-02-19 DIAGNOSIS — E119 Type 2 diabetes mellitus without complications: Secondary | ICD-10-CM | POA: Diagnosis not present

## 2022-02-19 DIAGNOSIS — E559 Vitamin D deficiency, unspecified: Secondary | ICD-10-CM | POA: Diagnosis not present

## 2022-02-19 DIAGNOSIS — E785 Hyperlipidemia, unspecified: Secondary | ICD-10-CM | POA: Diagnosis not present

## 2022-02-19 DIAGNOSIS — I10 Essential (primary) hypertension: Secondary | ICD-10-CM | POA: Diagnosis not present

## 2022-02-20 LAB — HEMOGLOBIN A1C
Est. average glucose Bld gHb Est-mCnc: 137 mg/dL
Hgb A1c MFr Bld: 6.4 % — ABNORMAL HIGH (ref 4.8–5.6)

## 2022-02-20 LAB — CMP14+EGFR
ALT: 18 IU/L (ref 0–44)
AST: 21 IU/L (ref 0–40)
Albumin/Globulin Ratio: 2.3 — ABNORMAL HIGH (ref 1.2–2.2)
Albumin: 4.4 g/dL (ref 3.8–4.8)
Alkaline Phosphatase: 78 IU/L (ref 44–121)
BUN/Creatinine Ratio: 10 (ref 10–24)
BUN: 8 mg/dL (ref 8–27)
Bilirubin Total: 0.4 mg/dL (ref 0.0–1.2)
CO2: 26 mmol/L (ref 20–29)
Calcium: 10 mg/dL (ref 8.6–10.2)
Chloride: 99 mmol/L (ref 96–106)
Creatinine, Ser: 0.82 mg/dL (ref 0.76–1.27)
Globulin, Total: 1.9 g/dL (ref 1.5–4.5)
Glucose: 123 mg/dL — ABNORMAL HIGH (ref 70–99)
Potassium: 4.1 mmol/L (ref 3.5–5.2)
Sodium: 143 mmol/L (ref 134–144)
Total Protein: 6.3 g/dL (ref 6.0–8.5)
eGFR: 93 mL/min/{1.73_m2} (ref >59)

## 2022-02-20 LAB — CBC
Hematocrit: 39.7 % (ref 37.5–51.0)
Hemoglobin: 13 g/dL (ref 13.0–17.7)
MCH: 30.2 pg (ref 26.6–33.0)
MCHC: 32.7 g/dL (ref 31.5–35.7)
MCV: 92 fL (ref 79–97)
Platelets: 196 10*3/uL (ref 150–450)
RBC: 4.3 x10E6/uL (ref 4.14–5.80)
RDW: 11.6 % (ref 11.6–15.4)
WBC: 5 10*3/uL (ref 3.4–10.8)

## 2022-02-20 LAB — LIPID PANEL
Chol/HDL Ratio: 2 ratio (ref 0.0–5.0)
Cholesterol, Total: 153 mg/dL (ref 100–199)
HDL: 75 mg/dL (ref >39)
LDL Chol Calc (NIH): 67 mg/dL (ref 0–99)
Triglycerides: 54 mg/dL (ref 0–149)
VLDL Cholesterol Cal: 11 mg/dL (ref 5–40)

## 2022-02-20 LAB — TSH: TSH: 2.62 u[IU]/mL (ref 0.450–4.500)

## 2022-02-20 LAB — VITAMIN D 25 HYDROXY (VIT D DEFICIENCY, FRACTURES): Vit D, 25-Hydroxy: 34 ng/mL (ref 30.0–100.0)

## 2022-02-21 ENCOUNTER — Encounter: Payer: Self-pay | Admitting: Family Medicine

## 2022-02-21 ENCOUNTER — Ambulatory Visit (INDEPENDENT_AMBULATORY_CARE_PROVIDER_SITE_OTHER): Payer: Medicare HMO | Admitting: Family Medicine

## 2022-02-21 VITALS — BP 132/81 | HR 109 | Ht 67.0 in | Wt 121.1 lb

## 2022-02-21 DIAGNOSIS — R0609 Other forms of dyspnea: Secondary | ICD-10-CM | POA: Diagnosis not present

## 2022-02-21 DIAGNOSIS — Z0001 Encounter for general adult medical examination with abnormal findings: Secondary | ICD-10-CM

## 2022-02-21 DIAGNOSIS — E099 Drug or chemical induced diabetes mellitus without complications: Secondary | ICD-10-CM | POA: Diagnosis not present

## 2022-02-21 DIAGNOSIS — J449 Chronic obstructive pulmonary disease, unspecified: Secondary | ICD-10-CM

## 2022-02-21 MED ORDER — UNABLE TO FIND: 0 refills | Status: DC

## 2022-02-21 NOTE — Patient Instructions (Addendum)
F/u in 6 months, call if you need me sooner  Excellent labs and exam  Please arrange diabetic eye exam in office   Please send to case worker, requesting cleaning home assistance once per month due to severe COPD, nurse pls refer  Thanks for choosing [facility], we consider it a privelige to serve you.   Nurse pls give rx for TdaP and covid vaccine to pt to take to the pharmacy  Thanks for choosing [facility], we consider it a privelige to serve you.

## 2022-02-21 NOTE — Assessment & Plan Note (Signed)
Controlled, no change in medication [name] is reminded of the importance of commitment to daily physical activity for 30 minutes or more, as able and the need to limit carbohydrate intake to 30 to 60 grams per meal to help with blood sugar control.   The need to take medication as prescribed, test blood sugar as directed, and to call between visits if there is a concern that blood sugar is uncontrolled is also discussed.   [name] is reminded of the importance of daily foot exam, annual eye examination, and good blood sugar, blood pressure and cholesterol control.     Latest Ref Rng & Units 02/19/2022    9:02 AM 10/25/2021   10:30 AM 07/18/2021    8:52 AM 05/18/2021    1:48 PM 04/05/2021    9:10 AM  Diabetic Labs  HbA1c 4.8 - 5.6 % 6.4  6.5      Micro/Creat Ratio 0 - 29 mg/g creat  32      Chol 100 - 199 mg/dL 153  217  183     HDL >39 mg/dL 75  84  85     Calc LDL 0 - 99 mg/dL 67  121  90     Triglycerides 0 - 149 mg/dL 54  67  35     Creatinine 0.76 - 1.27 mg/dL 0.82  0.79  0.91  0.96  0.84       02/21/2022   11:04 AM 12/11/2021   11:01 AM 12/11/2021   10:51 AM 12/11/2021   10:48 AM 11/19/2021   10:40 AM 10/25/2021   10:07 AM 10/25/2021    9:44 AM  BP/Weight  Systolic BP Q000111Q A999333 0000000 Q000111Q [id number] 0000000  Diastolic BP 81 72 82 86 78 80 73  Wt. (Lbs) 121.12   121 123.4  123.12  BMI 18.97 kg/m2   18.95 kg/m2 19.33 kg/m2  19.28 kg/m2      Latest Ref Rng & Units 10/25/2021    9:40 AM 06/06/2020   12:00 AM  Foot/eye exam completion dates  Eye Exam No Retinopathy No Retinopathy  No Retinopathy       No Retinopathy      Foot Form Completion  Done      This result is from an external source.   Multiple values from one day are sorted in reverse-chronological order

## 2022-02-25 ENCOUNTER — Telehealth: Payer: Self-pay | Admitting: *Deleted

## 2022-02-25 ENCOUNTER — Encounter: Payer: Self-pay | Admitting: Family Medicine

## 2022-02-25 DIAGNOSIS — Z0001 Encounter for general adult medical examination with abnormal findings: Secondary | ICD-10-CM | POA: Insufficient documentation

## 2022-02-25 NOTE — Progress Notes (Signed)
[name]     [name]: [id number]      DOB: [birth year]   HPI: Patient is in for annual physical exam. No other health concerns are expressed or addressed at the visit. Recent labs, if available are reviewed. Immunization is reviewed , and  updated if needed.    PE; BP 132/81 (BP Location: Right Arm, Patient Position: Sitting, Cuff Size: Normal)   Pulse (!) 109   Ht 5' 7"$  (1.702 m)   Wt 121 lb 1.9 oz (54.9 kg)   SpO2 92%   BMI 18.97 kg/m   Pleasant male, alert and oriented x 3, in no cardio-pulmonary distress. Afebrile. HEENT No facial trauma or asymetry. Sinuses non tender. EOMI External ears normal,  Neck: supple, no adenopathy,JVD or thyromegaly.No bruits.  Chest: Markedly decreased air entry bilaterally scattered wheezes, no crackles Non tender to palpation  Cardiovascular system; Heart sounds normal,  S1 and  S2 ,no S3.  No murmur, or thrill.  Abdomen: Soft, non tender   Musculoskeletal exam: Full ROM of spine, hips , shoulders and knees. No deformity ,swelling or crepitus noted. No muscle wasting or atrophy.   Neurologic: Cranial nerves 2 to 12 intact. Power, tone ,sensation  normal throughout. No disturbance in gait. No tremor.  Skin: Intact, no ulceration, erythema , scaling or rash noted. Pigmentation normal throughout  Psych; Normal mood and affect. Judgement and concentration normal   Assessment & Plan:  DM (diabetes mellitus) (HCC) Controlled, no change in medication [name] is reminded of the importance of commitment to daily physical activity for 30 minutes or more, as able and the need to limit carbohydrate intake to 30 to 60 grams per meal to help with blood sugar control.   The need to take medication as prescribed, test blood sugar as directed, and to call between visits if there is a concern that blood sugar is uncontrolled is also discussed.   [name] is reminded of the importance of daily foot exam, annual eye examination,  and good blood sugar, blood pressure and cholesterol control.     Latest Ref Rng & Units 02/19/2022    9:02 AM 10/25/2021   10:30 AM 07/18/2021    8:52 AM 05/18/2021    1:48 PM 04/05/2021    9:10 AM  Diabetic Labs  HbA1c 4.8 - 5.6 % 6.4  6.5      Micro/Creat Ratio 0 - 29 mg/g creat  32      Chol 100 - 199 mg/dL 153  217  183     HDL >39 mg/dL 75  84  85     Calc LDL 0 - 99 mg/dL 67  121  90     Triglycerides 0 - 149 mg/dL 54  67  35     Creatinine 0.76 - 1.27 mg/dL 0.82  0.79  0.91  0.96  0.84       02/21/2022   11:04 AM 12/11/2021   11:01 AM 12/11/2021   10:51 AM 12/11/2021   10:48 AM 11/19/2021   10:40 AM 10/25/2021   10:07 AM 10/25/2021    9:44 AM  BP/Weight  Systolic BP Q000111Q A999333 0000000 Q000111Q [id number] A999333 0000000  Diastolic BP 81 72 82 86 78 80 73  Wt. (Lbs) 121.12   121 123.4  123.12  BMI 18.97 kg/m2   18.95 kg/m2 19.33 kg/m2  19.28 kg/m2      Latest Ref Rng & Units 10/25/2021    9:40 AM 06/06/2020   12:00 AM  Foot/eye exam completion dates  Eye Exam No Retinopathy No Retinopathy  No Retinopathy       No Retinopathy      Foot Form Completion  Done      This result is from an external source.   Multiple values from one day are sorted in reverse-chronological order        Encounter for Medicare annual examination with abnormal findings Annual exam as documented. Counseling done  re healthy lifestyle involving commitment to 150 minutes exercise per week, heart healthy diet, and attaining healthy weight.The importance of adequate sleep also discussed. Regular seat belt use and home safety, is also discussed. Changes in health habits are decided on by the patient with goals and time frames  set for achieving them. Immunization and cancer screening needs are specifically addressed at this visit.   DOE (dyspnea on exertion) Requests assistance with cleaning home onc weekly x 4 hrs will refer   COPD GOLD IV/ group D  Severe disease equests assistance with cleaning once weekly ,  will refer

## 2022-02-25 NOTE — Assessment & Plan Note (Signed)

## 2022-02-25 NOTE — Assessment & Plan Note (Signed)
Requests assistance with cleaning home onc weekly x 4 hrs will refer

## 2022-02-25 NOTE — Assessment & Plan Note (Signed)
Severe disease equests assistance with cleaning once weekly , will refer

## 2022-02-25 NOTE — Progress Notes (Signed)
  Care Coordination  Outreach Note  02/25/2022 Name: JONATHON CONGER [name]: [id number] DOB: Dec 07, 1950   Care Coordination Outreach Attempts: An unsuccessful telephone outreach was attempted today to offer the patient information about available care coordination services as a benefit of their health plan.   Follow Up Plan:  Additional outreach attempts will be made to offer the patient care coordination information and services.   Encounter Outcome:  No Answer  [place]  Direct Dial: [phone]

## 2022-02-25 NOTE — Progress Notes (Signed)
  Care Coordination   Note   02/25/2022 Name: [name] [name]: [id number] DOB: 09/17/1950  [name] is a 72 y.o. year old male who sees Moshe Cipro, [place] Levo, MD for primary care. I reached out to Sharlot Gowda by phone today to offer care coordination services.  [name] was given information about Care Coordination services today including:   The Care Coordination services include support from the care team which includes your Nurse Coordinator, Clinical Social Worker, or Pharmacist.  The Care Coordination team is here to help remove barriers to the health concerns and goals most important to you. Care Coordination services are voluntary, and the patient may decline or stop services at any time by request to their care team member.   Care Coordination Consent Status: Patient agreed to services and verbal consent obtained.   Follow up plan:  Telephone appointment with care coordination team member scheduled for:  03/01/22  Encounter Outcome:  Pt. Scheduled  [place]  Direct Dial: [phone]

## 2022-02-28 ENCOUNTER — Ambulatory Visit: Payer: Medicare HMO

## 2022-03-01 ENCOUNTER — Ambulatory Visit: Payer: Self-pay | Admitting: *Deleted

## 2022-03-01 ENCOUNTER — Encounter: Payer: Self-pay | Admitting: *Deleted

## 2022-03-01 NOTE — Patient Outreach (Signed)
Care Coordination   Initial Visit Note   03/01/2022  Name: [name] [name]: [id number] DOB: Jul 30, 1950  [name] is a 72 y.o. year old male who sees Moshe Cipro, [place] Levo, MD for primary care. I spoke with Sharlot Gowda by phone today.  What matters to the patients health and wellness today?   Assist with Applying for Medicaid and [place].   Goals Addressed               This Visit's Progress     Assist with Applying for Medicaid and Personal Care Services. (pt-stated)   On track     Care Coordination Interventions:  Interventions Today    Flowsheet Row Most Recent Value  Chronic Disease   Chronic disease during today's visit Diabetes, Chronic Obstructive Pulmonary Disease (COPD), Hypertension (HTN), Other  [History of Prostate Cancer, Requiring Assistance with Activities of Daily Living & Social Isolation.]  General Interventions   General Interventions Discussed/Reviewed General Interventions Discussed, General Interventions Reviewed, Annual Eye Exam, Labs, Annual Foot Exam, Durable Medical Equipment (DME), Vaccines, Health Screening, Doctor Visits, Emergency planning/management officer, Communication with  [employer] Care Provider]  Labs Hgb A1c every 3 months  Vaccines COVID-19, Flu, Pneumonia, RSV, Shingles, Tetanus/Pertussis/Diphtheria  [Encouraged]  Doctor Visits Discussed/Reviewed Doctor Visits Discussed, Doctor Visits Reviewed, Annual Wellness Visits, PCP, Specialist  [Encouraged]  Health Screening Colonoscopy, Prostate  [Encouraged]  Durable Medical Equipment (DME) Glucomoter, Oxygen  PCP/Specialist Visits Compliance with follow-up visit  Communication with PCP/Specialists, RN  Exercise Interventions   Exercise Discussed/Reviewed Exercise Discussed, Exercise Reviewed, Physical Activity, Weight Managment, Assistive device use and maintanence  [Encouraged]  Physical Activity Discussed/Reviewed Physical Activity Discussed, Physical Activity Reviewed, Types of  exercise, Home Exercise Program (HEP)  [Encouraged Increased Level of Activity & Exercise]  Weight Management Weight maintenance  [Encouraged]  Education Interventions   Education Provided Provided Engineer, site, Provided Education, Provided Web-based Education  Provided Verbal Education On Nutrition, Foot Care, Eye Care, Blood Sugar Monitoring, Mental Health/Coping with Illness, Applications, Exercise, Medication, Personal assistant, [employer], When to see the doctor  [Encouraged]  [place] Discussed, Mental Health Reviewed, Coping Strategies, Crisis, Anxiety, Depression, Grief and Loss, Substance Abuse, Suicide  Nutrition Interventions   Nutrition Discussed/Reviewed Nutrition Discussed, Nutrition Reviewed, Adding fruits and vegetables, Fluid intake, Decreasing sugar intake, Increaing proteins, Decreasing fats, Decreasing salt, Supplmental nutrition  Pharmacy Interventions   Pharmacy Dicussed/Reviewed Pharmacy Topics Discussed, Pharmacy Topics Reviewed, Medication Adherence, Affording Medications  Safety Interventions   Safety Discussed/Reviewed Safety Discussed, Safety Reviewed  Advanced Directive Interventions   Advanced Directives Discussed/Reviewed Advanced Directives Discussed, Advanced Directives Reviewed     Assessed Social Determinant of Health Barriers. Discussed Plans for Ongoing Care Management Follow Up. Provided Tree surgeon Information for Care Management Team Members. Screened for Signs & Symptoms of Depression, As Related to Chronic Disease State.   PHQ2 & PHQ9 Depression Screen Completed & Results Reviewed.  Suicidal Ideation & Homicidal Ideation Assessed - None Present.   Access to Weapons Assessed - None Present. Assessed for Domestic Violence - None Present.    Crisis Information, Agencies, Services & Resource Discussed. Active Listening & Reflection Utilized.  Verbalization of Feelings  Encouraged.  Emotional Support Provided. Feelings of Frustration Regarding Loss of Independence Validated. Caregiver Stress Acknowledged. Caregiver Support Groups Reviewed. Self-Enrollment in Caregiver Support Group of Interest Emphasized, from List Provided. Problem Solving Interventions Identified. Solution-Focused Strategies Developed. Task-Centered Solutions Implemented.   Brief Cognitive Behavioral Therapy  Performed. Acceptance & Commitment Therapy Introduced. Client-Centered Therapy Enacted. Psychoeducation for Mental Health Concerns Initiated. Reviewed Prescription Medications & Discussed Importance of Compliance. Quality of Sleep Assessed & Sleep Hygiene Techniques Promoted. Discussed Higher Level of Care Options (Miguel Barrera, [place]) & Encouraged Consideration. Verified No In-Home Care Services, [employer], Building control surveyor, Etc., Covered Under Teaching laboratory technician through [employer].  Reviewed Designer, television/film set through [facility] & Encouraged Completion of Medicaid Application. Verified No Long-Term Care Insurance Benefits, [employer], Plans, Etc.  Confirmed Patient, Nor Deceased Wife, Were Veterans, Making Patient Ineligible for Aid & Attendance Benefits, Through [employer]. Please Review the Following List of Express Scripts, [employer], [employer], Mailed on 03/01/2022: ~ [place] ~ [place] ~ [place] ~ 2023 Medicaid Tips ~ Medicaid Application ~ [place] Application  ~ [place] Instructions ~ [place] Providers Please Be Prepared to Complete Application for Medicaid & Submit to [facility] [phone]), for Processing, During Initial Home Visit with CSW,  Scheduled on 03/15/2022 at 9:30 AM. Please Be Prepared to Complete Application for [place] to [facility] [phone]), for Processing, During Initial Home Visit with CSW, Scheduled on 03/15/2022 at 9:30 AM. Please Contact CSW Directly (# [phone]), If You Have Questions, Need Assistance, or If Additional Social Work Needs Are Identified Between Now & Our Initial Home Visit, Scheduled on 03/15/2022 at 9:30 AM.        SDOH assessments and interventions completed:  Yes.  SDOH Interventions Today    Flowsheet Row Most Recent Value  SDOH Interventions   Food Insecurity Interventions Intervention Not Indicated  Housing Interventions Intervention Not Indicated  Transportation Interventions Intervention Not Indicated, Patient Resources (Friends/Family), Payor Benefit  Alcohol Usage Interventions Intervention Not Indicated (Score <7)  Financial Strain Interventions Intervention Not Indicated  Physical Activity Interventions Intervention Not Indicated  Stress Interventions Intervention Not Indicated  Social Connections Interventions Intervention Not Indicated     Care Coordination Interventions:  Yes, provided.   Follow up plan: Follow up call scheduled for 03/15/2022 at 9:30 am.  Encounter Outcome:  Pt. Visit Completed.   Nat Christen, BSW, MSW, LCSW  Licensed Education officer, [facility]  Mailing [place] [address] [place], [place] [address] Physical Address-[address] [place],  [address] Toll Free Main # [phone] Fax # [phone] Cell # [phone] [email or web address]

## 2022-03-01 NOTE — Patient Instructions (Signed)
Visit Information  Thank you for taking time to visit with me today. Please don't hesitate to contact me if I can be of assistance to you.   Following are the goals we discussed today:   Goals Addressed               This Visit's Progress     Assist with Applying for Medicaid and Personal Care Services. (pt-stated)   On track     Care Coordination Interventions:  Interventions Today    Flowsheet Row Most Recent Value  Chronic Disease   Chronic disease during today's visit Diabetes, Chronic Obstructive Pulmonary Disease (COPD), Hypertension (HTN), Other  [History of Prostate Cancer, Requiring Assistance with Activities of Daily Living & Social Isolation.]  General Interventions   General Interventions Discussed/Reviewed General Interventions Discussed, General Interventions Reviewed, Annual Eye Exam, Labs, Annual Foot Exam, Durable Medical Equipment (DME), Vaccines, Health Screening, Doctor Visits, Emergency planning/management officer, Communication with  [employer] Care Provider]  Labs Hgb A1c every 3 months  Vaccines COVID-19, Flu, Pneumonia, RSV, Shingles, Tetanus/Pertussis/Diphtheria  [Encouraged]  Doctor Visits Discussed/Reviewed Doctor Visits Discussed, Doctor Visits Reviewed, Annual Wellness Visits, PCP, Specialist  [Encouraged]  Health Screening Colonoscopy, Prostate  [Encouraged]  Durable Medical Equipment (DME) Glucomoter, Oxygen  PCP/Specialist Visits Compliance with follow-up visit  Communication with PCP/Specialists, RN  Exercise Interventions   Exercise Discussed/Reviewed Exercise Discussed, Exercise Reviewed, Physical Activity, Weight Managment, Assistive device use and maintanence  [Encouraged]  Physical Activity Discussed/Reviewed Physical Activity Discussed, Physical Activity Reviewed, Types of exercise, Home Exercise Program (HEP)  [Encouraged Increased Level of Activity & Exercise]  Weight Management Weight maintenance  [Encouraged]  Education Interventions   Education Provided  Provided Engineer, site, Provided Education, Provided Web-based Education  Provided Verbal Education On Nutrition, Foot Care, Eye Care, Blood Sugar Monitoring, Mental Health/Coping with Illness, Applications, Exercise, Medication, Personal assistant, [employer], When to see the doctor  [Encouraged]  [place] Discussed, Mental Health Reviewed, Coping Strategies, Crisis, Anxiety, Depression, Grief and Loss, Substance Abuse, Suicide  Nutrition Interventions   Nutrition Discussed/Reviewed Nutrition Discussed, Nutrition Reviewed, Adding fruits and vegetables, Fluid intake, Decreasing sugar intake, Increaing proteins, Decreasing fats, Decreasing salt, Supplmental nutrition  Pharmacy Interventions   Pharmacy Dicussed/Reviewed Pharmacy Topics Discussed, Pharmacy Topics Reviewed, Medication Adherence, Affording Medications  Safety Interventions   Safety Discussed/Reviewed Safety Discussed, Safety Reviewed  Advanced Directive Interventions   Advanced Directives Discussed/Reviewed Advanced Directives Discussed, Advanced Directives Reviewed     Assessed Social Determinant of Health Barriers. Discussed Plans for Ongoing Care Management Follow Up. Provided Tree surgeon Information for Care Management Team Members. Screened for Signs & Symptoms of Depression, As Related to Chronic Disease State.   PHQ2 & PHQ9 Depression Screen Completed & Results Reviewed.  Suicidal Ideation & Homicidal Ideation Assessed - None Present.   Access to Weapons Assessed - None Present. Assessed for Domestic Violence - None Present.    Crisis Information, Agencies, Services & Resource Discussed. Active Listening & Reflection Utilized.  Verbalization of Feelings Encouraged.  Emotional Support Provided. Feelings of Frustration Regarding Loss of Independence Validated. Caregiver Stress Acknowledged. Caregiver Support Groups Reviewed. Self-Enrollment  in Caregiver Support Group of Interest Emphasized, from List Provided. Problem Solving Interventions Identified. Solution-Focused Strategies Developed. Task-Centered Solutions Implemented.   Brief Cognitive Behavioral Therapy Performed. Acceptance & Commitment Therapy Introduced. Client-Centered Therapy Enacted. Psychoeducation for Mental Health Concerns Initiated. Reviewed Prescription Medications & Discussed Importance of Compliance. Quality of Sleep Assessed & Sleep Hygiene Techniques Promoted. Discussed  Higher Level of Care Options ([place], [place]) & Encouraged Consideration. Verified No In-Home Care Services, [employer], Building control surveyor, Etc., Covered Under Teaching laboratory technician through [employer].  Reviewed Designer, television/film set through [facility] & Encouraged Completion of Medicaid Application. Verified No Long-Term Care Insurance Benefits, [employer], Plans, Etc.  Confirmed Patient, Nor Deceased Wife, Were Veterans, Making Patient Ineligible for Aid & Attendance Benefits, Through [employer]. Please Review the Following List of Express Scripts, Bank [employer], [employer], Mailed on 03/01/2022: ~ [place] ~ [place] ~ [place] ~ 2023 Medicaid Tips ~ Medicaid Application ~ [place] Application  ~ [place] Instructions ~ [place] Providers Please Be Prepared to Complete Application for Medicaid & Submit to The [place] [phone]), for Processing, During Initial Home Visit with CSW, Scheduled on 03/15/2022 at 9:30 AM. Please Be Prepared to Complete Application for [place] to [facility] [phone]), for Processing, During Initial Home  Visit with CSW, Scheduled on 03/15/2022 at 9:30 AM. Please Contact CSW Directly (# [phone]), If You Have Questions, Need Assistance, or If Additional Social Work Needs Are Identified Between Now & Our Initial Home Visit, Scheduled on 03/15/2022 at 9:30 AM.      Our next appointment is by telephone on 03/15/2022 at 9:30 am.  Please call the care guide team at [phone] if you need to cancel or reschedule your appointment.   If you are experiencing a Mental Health or [place] or need someone to talk to, please call the Suicide and Crisis Lifeline: 988 call the [place] National Suicide Prevention Lifeline: 530-610-7897 or TTY: 248-097-5984 TTY (747) 704-8151) to talk to a trained counselor call 1-800-273-TALK (toll free, 24 hour hotline) go to [facility] [address], [place] [phone]) call the [place]: [phone] call 911  Patient verbalizes understanding of instructions and care plan provided today and agrees to view in [place]. Active MyChart status and patient understanding of how to access instructions and care plan via MyChart confirmed with patient.     Telephone follow up appointment with care management team member scheduled for: 03/15/2022 at 9:30 am.  Nat Christen, BSW, MSW, [place]  Licensed Clinical Social Worker  [place]  Mailing [place]. [address] [place], [place] [address] Physical Address[address], [place],  [address] Toll Free Main # [phone] Fax # [phone] Cell # [phone] [email or web address]

## 2022-03-13 ENCOUNTER — Ambulatory Visit: Payer: Self-pay | Admitting: *Deleted

## 2022-03-13 ENCOUNTER — Encounter: Payer: Self-pay | Admitting: *Deleted

## 2022-03-13 NOTE — Patient Instructions (Signed)
Visit Information  Thank you for taking time to visit with me today. Please don't hesitate to contact me if I can be of assistance to you.   Following are the goals we discussed today:   Goals Addressed               This Visit's Progress     Assist with Applying for Medicaid and Personal Care Services. (pt-stated)   On track     Care Coordination Interventions:  Interventions Today    Flowsheet Row Most Recent Value  Chronic Disease   Chronic disease during today's visit Diabetes, Chronic Obstructive Pulmonary Disease (COPD), Hypertension (HTN), Other  [History of Prostate Cancer, Requiring Assistance with Activities of Daily Living & Social Isolation.]  General Interventions   General Interventions Discussed/Reviewed General Interventions Discussed, General Interventions Reviewed, Annual Eye Exam, Labs, Annual Foot Exam, Durable Medical Equipment (DME), Vaccines, Health Screening, Doctor Visits, Emergency planning/management officer, Communication with  [employer] Care Provider]  Labs Hgb A1c every 3 months  Vaccines COVID-19, Flu, Pneumonia, RSV, Shingles, Tetanus/Pertussis/Diphtheria  [Encouraged]  Doctor Visits Discussed/Reviewed Doctor Visits Discussed, Doctor Visits Reviewed, Annual Wellness Visits, PCP, Specialist  [Encouraged]  Health Screening Colonoscopy, Prostate  [Encouraged]  Durable Medical Equipment (DME) Glucomoter, Oxygen  PCP/Specialist Visits Compliance with follow-up visit  Communication with PCP/Specialists, RN  Exercise Interventions   Exercise Discussed/Reviewed Exercise Discussed, Exercise Reviewed, Physical Activity, Weight Managment, Assistive device use and maintanence  [Encouraged]  Physical Activity Discussed/Reviewed Physical Activity Discussed, Physical Activity Reviewed, Types of exercise, Home Exercise Program (HEP)  [Encouraged Increased Level of Activity & Exercise]  Weight Management Weight maintenance  [Encouraged]  Education Interventions   Education Provided  Provided Engineer, site, Provided Education, Provided Web-based Education  Provided Verbal Education On Nutrition, Foot Care, Eye Care, Blood Sugar Monitoring, Mental Health/Coping with Illness, Applications, Exercise, Medication, Personal assistant, [employer], When to see the doctor  [Encouraged]  [place] Discussed, Mental Health Reviewed, Coping Strategies, Crisis, Anxiety, Depression, Grief and Loss, Substance Abuse, Suicide  Nutrition Interventions   Nutrition Discussed/Reviewed Nutrition Discussed, Nutrition Reviewed, Adding fruits and vegetables, Fluid intake, Decreasing sugar intake, Increaing proteins, Decreasing fats, Decreasing salt, Supplmental nutrition  Pharmacy Interventions   Pharmacy Dicussed/Reviewed Pharmacy Topics Discussed, Pharmacy Topics Reviewed, Medication Adherence, Affording Medications  Safety Interventions   Safety Discussed/Reviewed Safety Discussed, Safety Reviewed  Advanced Directive Interventions   Advanced Directives Discussed/Reviewed Advanced Directives Discussed, Advanced Directives Reviewed     Active Listening & Reflection Utilized.  Verbalization of Feelings Encouraged.  Emotional Support Provided. Caregiver Stress Acknowledged. Caregiver Resources Reviewed. Caregiver Support Groups Discussed. Self-Enrollment in Caregiver Support Group of Interest Emphasized. Problem Solving Interventions Indicated. Solution-Focused Strategies Activated. Task-Centered Solutions Implemented.   Brief Cognitive Behavioral Therapy Performed. Acceptance & Commitment Therapy Initiated. Client-Centered Therapy Enacted. Re-mailed the Following List of Express Scripts, Bank [employer] Company, [employer], on [id number], Due to Non-Receipt on 03/01/2022: ~ [place] ~ [place] ~ [place] ~ 2023 Medicaid Tips ~ Medicaid  Application ~ [place] Application  ~ [place] Instructions ~ [place] with Denman George, Madelaine Etienne to [employer] with Completion of Application for [place] to [facility] [phone]), for Processing. CSW Collaboration with [place] April to [employer] with Completion of Application for [place] to [facility] [phone]), for Processing. Please  Contact CSW Directly (# [phone]), If You Have Questions, Need Assistance, or If Additional Social Work Needs Are Identified Between Now & Our Next Scheduled Telephone [employer].      Our next appointment is by telephone on 03/29/2022 at 12:45 pm.  Please call the care guide team at [phone] if you need to cancel or reschedule your appointment.   If you are experiencing a Mental Health or [place] or need someone to talk to, please call the Suicide and Crisis Lifeline: 988 call the [place] National Suicide Prevention Lifeline: 9516075822 or TTY: 6514721438 TTY 331-751-0358) to talk to a trained counselor call 1-800-273-TALK (toll free, 24 hour hotline) go to [facility] [address], [place] [phone]) call the [place]: [phone] call 911  Patient verbalizes understanding of instructions and care plan provided today and agrees to view in [place]. Active MyChart status and patient understanding of how to access instructions and care plan via MyChart confirmed with patient.     Telephone follow up appointment with care management team member scheduled for:  03/29/2022 at 12:45 pm.  Nat Christen, BSW, MSW, [place]  Licensed Clinical Social Worker  [place]  Mailing [place]. [address]  [place], [place] [address] Physical Address[address] [place], [place] [address] Toll Free Main # [phone] Fax # [phone] Cell # [phone] Di [email or web address]

## 2022-03-13 NOTE — Patient Outreach (Signed)
Care Coordination   Follow Up Visit Note   03/13/2022  Name: [name] [name]: [id number] DOB: Dec 30, 1950  [name] is a 72 y.o. year old male who sees Moshe Cipro, Norwood Levo, MD for primary care. I spoke with Sharlot Gowda and friend, Madelaine Etienne by phone today.  What matters to the patients health and wellness today?   Assist with Applying for Medicaid and [place].   Goals Addressed               This Visit's Progress     Assist with Applying for Medicaid and Personal Care Services. (pt-stated)   On track     Care Coordination Interventions:  Interventions Today    Flowsheet Row Most Recent Value  Chronic Disease   Chronic disease during today's visit Diabetes, Chronic Obstructive Pulmonary Disease (COPD), Hypertension (HTN), Other  [History of Prostate Cancer, Requiring Assistance with Activities of Daily Living & Social Isolation.]  General Interventions   General Interventions Discussed/Reviewed General Interventions Discussed, General Interventions Reviewed, Annual Eye Exam, Labs, Annual Foot Exam, Durable Medical Equipment (DME), Vaccines, Health Screening, Doctor Visits, Emergency planning/management officer, Communication with  [employer] Care Provider]  Labs Hgb A1c every 3 months  Vaccines COVID-19, Flu, Pneumonia, RSV, Shingles, Tetanus/Pertussis/Diphtheria  [Encouraged]  Doctor Visits Discussed/Reviewed Doctor Visits Discussed, Doctor Visits Reviewed, Annual Wellness Visits, PCP, Specialist  [Encouraged]  Health Screening Colonoscopy, Prostate  [Encouraged]  Durable Medical Equipment (DME) Glucomoter, Oxygen  PCP/Specialist Visits Compliance with follow-up visit  Communication with PCP/Specialists, RN  Exercise Interventions   Exercise Discussed/Reviewed Exercise Discussed, Exercise Reviewed, Physical Activity, Weight Managment, Assistive device use and maintanence  [Encouraged]  Physical Activity Discussed/Reviewed Physical Activity Discussed, Physical Activity  Reviewed, Types of exercise, Home Exercise Program (HEP)  [Encouraged Increased Level of Activity & Exercise]  Weight Management Weight maintenance  [Encouraged]  Education Interventions   Education Provided Provided Engineer, site, Provided Education, Provided Web-based Education  Provided Verbal Education On Nutrition, Foot Care, Eye Care, Blood Sugar Monitoring, Mental Health/Coping with Illness, Applications, Exercise, Medication, Personal assistant, [employer], When to see the doctor  [Encouraged]  [place] Discussed, Mental Health Reviewed, Coping Strategies, Crisis, Anxiety, Depression, Grief and Loss, Substance Abuse, Suicide  Nutrition Interventions   Nutrition Discussed/Reviewed Nutrition Discussed, Nutrition Reviewed, Adding fruits and vegetables, Fluid intake, Decreasing sugar intake, Increaing proteins, Decreasing fats, Decreasing salt, Supplmental nutrition  Pharmacy Interventions   Pharmacy Dicussed/Reviewed Pharmacy Topics Discussed, Pharmacy Topics Reviewed, Medication Adherence, Affording Medications  Safety Interventions   Safety Discussed/Reviewed Safety Discussed, Safety Reviewed  Advanced Directive Interventions   Advanced Directives Discussed/Reviewed Advanced Directives Discussed, Advanced Directives Reviewed     Active Listening & Reflection Utilized.  Verbalization of Feelings Encouraged.  Emotional Support Provided. Caregiver Stress Acknowledged. Caregiver Resources Reviewed. Caregiver Support Groups Discussed. Self-Enrollment in Caregiver Support Group of Interest Emphasized. Problem Solving Interventions Indicated. Solution-Focused Strategies Activated. Task-Centered Solutions Implemented.   Brief Cognitive Behavioral Therapy Performed. Acceptance & Commitment Therapy Initiated. Client-Centered Therapy Enacted. Re-mailed the Following List of Express Scripts, Bank [employer], [employer], on [id number], Due to Non-Receipt on 03/01/2022: ~ [place] ~ [place] ~ [place] ~ 2023 Medicaid Tips ~ Medicaid Application ~ [place] Application  ~ [place] Instructions ~ [place] with [place] April to [employer] with Completion of Application for [place] to  The [place] [phone]), for Processing. CSW Collaboration with [place] April to [employer] with Completion of Application for [place] to [facility] [phone]), for Processing. Please Contact CSW Directly (# [phone]), If You Have Questions, Need Assistance, or If Additional Social Work Needs Are Identified Between Now & Our Next Scheduled Telephone [employer].      SDOH assessments and interventions completed:  Yes.  Care Coordination Interventions:  Yes, provided.   Follow up plan: Follow up call scheduled for 03/29/2022 at 12:45 pm.  Encounter Outcome:  Pt. Visit Completed.   Nat Christen, BSW, MSW, LCSW  Licensed Education officer, environmental Health System  Mailing [place] [address]. [address] [place], [place] [address] Physical Address[address], [place], [place] [address] Toll Free Main # [phone] Fax # [phone] Cell # [phone] [email or web address]

## 2022-03-15 ENCOUNTER — Ambulatory Visit: Payer: Medicare HMO | Admitting: Pulmonary Disease

## 2022-03-15 ENCOUNTER — Encounter: Payer: Medicare HMO | Admitting: *Deleted

## 2022-03-19 ENCOUNTER — Other Ambulatory Visit: Payer: Self-pay | Admitting: Pulmonary Disease

## 2022-03-19 DIAGNOSIS — J449 Chronic obstructive pulmonary disease, unspecified: Secondary | ICD-10-CM

## 2022-03-20 ENCOUNTER — Other Ambulatory Visit: Payer: Self-pay

## 2022-03-20 DIAGNOSIS — E119 Type 2 diabetes mellitus without complications: Secondary | ICD-10-CM

## 2022-03-20 MED ORDER — MONTELUKAST SODIUM 10 MG PO TABS: 10.0000 mg | ORAL_TABLET | Freq: Every day | ORAL | 3 refills | Status: DC

## 2022-03-20 MED ORDER — TRIAMTERENE-HCTZ 37.5-25 MG PO TABS: ORAL_TABLET | ORAL | 3 refills | Status: DC

## 2022-03-20 MED ORDER — AMLODIPINE BESYLATE 10 MG PO TABS: 10.0000 mg | ORAL_TABLET | Freq: Every day | ORAL | 3 refills | Status: DC

## 2022-03-20 MED ORDER — METFORMIN HCL 500 MG PO TABS: 500.0000 mg | ORAL_TABLET | Freq: Two times a day (BID) | ORAL | 0 refills | Status: DC

## 2022-03-20 MED ORDER — ROSUVASTATIN CALCIUM 20 MG PO TABS: 20.0000 mg | ORAL_TABLET | Freq: Every day | ORAL | 5 refills | Status: DC

## 2022-03-25 ENCOUNTER — Other Ambulatory Visit: Payer: Self-pay | Admitting: Family Medicine

## 2022-03-25 DIAGNOSIS — E119 Type 2 diabetes mellitus without complications: Secondary | ICD-10-CM

## 2022-03-29 ENCOUNTER — Ambulatory Visit: Payer: Medicare HMO | Admitting: Pulmonary Disease

## 2022-03-29 ENCOUNTER — Encounter: Payer: Self-pay | Admitting: *Deleted

## 2022-03-29 ENCOUNTER — Encounter: Payer: Self-pay | Admitting: Pulmonary Disease

## 2022-03-29 ENCOUNTER — Ambulatory Visit: Payer: Self-pay | Admitting: *Deleted

## 2022-03-29 VITALS — BP 137/84 | HR 104 | Ht 67.0 in | Wt 120.0 lb

## 2022-03-29 DIAGNOSIS — J439 Emphysema, unspecified: Secondary | ICD-10-CM

## 2022-03-29 DIAGNOSIS — J4489 Other specified chronic obstructive pulmonary disease: Secondary | ICD-10-CM

## 2022-03-29 DIAGNOSIS — J471 Bronchiectasis with (acute) exacerbation: Secondary | ICD-10-CM

## 2022-03-29 DIAGNOSIS — J441 Chronic obstructive pulmonary disease with (acute) exacerbation: Secondary | ICD-10-CM

## 2022-03-29 DIAGNOSIS — Z7952 Long term (current) use of systemic steroids: Secondary | ICD-10-CM

## 2022-03-29 DIAGNOSIS — J9611 Chronic respiratory failure with hypoxia: Secondary | ICD-10-CM

## 2022-03-29 MED ORDER — CEFUROXIME AXETIL 500 MG PO TABS: 500.0000 mg | ORAL_TABLET | Freq: Two times a day (BID) | ORAL | 0 refills | Status: DC

## 2022-03-29 NOTE — Patient Outreach (Signed)
Care Coordination   Follow Up Visit Note   03/29/2022  Name: [name] [name]: [id number] DOB: 17-Jan-1950  [name] is a 72 y.o. year old male who sees Moshe Cipro, Norwood Levo, MD for primary care. I spoke with Sharlot Gowda and friend, Madelaine Etienne by phone today.  What matters to the patients health and wellness today?   Assist with Applying for Medicaid and [place].   Goals Addressed               This Visit's Progress     Assist with Applying for Medicaid and Personal Care Services. (pt-stated)   On track     Care Coordination Interventions:  Interventions Today    Flowsheet Row Most Recent Value  Chronic Disease   Chronic disease during today's visit Diabetes, Chronic Obstructive Pulmonary Disease (COPD), Hypertension (HTN), Other  [History of Prostate Cancer, Requiring Assistance with Activities of Daily Living & Social Isolation.]  General Interventions   General Interventions Discussed/Reviewed General Interventions Discussed, General Interventions Reviewed, Annual Eye Exam, Labs, Annual Foot Exam, Durable Medical Equipment (DME), Vaccines, Health Screening, Doctor Visits, Emergency planning/management officer, Communication with  [employer] Care Provider]  Labs Hgb A1c every 3 months  Vaccines COVID-19, Flu, Pneumonia, RSV, Shingles, Tetanus/Pertussis/Diphtheria  [Encouraged]  Doctor Visits Discussed/Reviewed Doctor Visits Discussed, Doctor Visits Reviewed, Annual Wellness Visits, PCP, Specialist  [Encouraged]  Health Screening Colonoscopy, Prostate  [Encouraged]  Durable Medical Equipment (DME) Glucomoter, Oxygen  PCP/Specialist Visits Compliance with follow-up visit  Communication with PCP/Specialists, RN  Exercise Interventions   Exercise Discussed/Reviewed Exercise Discussed, Exercise Reviewed, Physical Activity, Weight Managment, Assistive device use and maintanence  [Encouraged]  Physical Activity Discussed/Reviewed Physical Activity Discussed, Physical Activity  Reviewed, Types of exercise, Home Exercise Program (HEP)  [Encouraged Increased Level of Activity & Exercise]  Weight Management Weight maintenance  [Encouraged]  Education Interventions   Education Provided Provided Engineer, site, Provided Education, Provided Web-based Education  Provided Verbal Education On Nutrition, Foot Care, Eye Care, Blood Sugar Monitoring, Mental Health/Coping with Illness, Applications, Exercise, Medication, Personal assistant, [employer], When to see the doctor  [Encouraged]  [place] Discussed, Mental Health Reviewed, Coping Strategies, Crisis, Anxiety, Depression, Grief and Loss, Substance Abuse, Suicide  Nutrition Interventions   Nutrition Discussed/Reviewed Nutrition Discussed, Nutrition Reviewed, Adding fruits and vegetables, Fluid intake, Decreasing sugar intake, Increaing proteins, Decreasing fats, Decreasing salt, Supplmental nutrition  Pharmacy Interventions   Pharmacy Dicussed/Reviewed Pharmacy Topics Discussed, Pharmacy Topics Reviewed, Medication Adherence, Affording Medications  Safety Interventions   Safety Discussed/Reviewed Safety Discussed, Safety Reviewed  Advanced Directive Interventions   Advanced Directives Discussed/Reviewed Advanced Directives Discussed, Advanced Directives Reviewed     Active Listening & Reflection Utilized.  Verbalization of Feelings Encouraged.  Emotional Support Provided. Caregiver Stress Acknowledged. Caregiver Resources Reviewed. Caregiver Support Groups Discussed. Self-Enrollment in Caregiver Support Group of Interest Emphasized. Problem Solving Interventions Indicated. Solution-Focused Strategies Activated. Task-Centered Solutions Implemented.   Brief Cognitive Behavioral Therapy Performed. Acceptance & Commitment Therapy Initiated. Client-Centered Therapy Enacted. [place] Offered for Not Receiving the List of [employer], [employer], Mailed on 03/13/2022 & 03/01/2022. CSW Collaboration with [place] April to Request Address to Mail the Following List of Express Scripts, [employer]ol Corporation, on 03/29/2022: ~ [place] ~ [place] ~ [place] with [place] April to [place] for Medicaid, through The [place]  Services [phone]), Due to Social Security Income Exceeding Poverty Guidelines for The [place] of [place] for [employer] 2024. CSW Collaboration with [place] April to Explain Patient's Inability to Apply for [place], through [facility] [phone]), Due to Ineligibility for Medicaid, through The [place] [phone]). CSW Collaboration with [place] April to [employer] with [place], [place] or [place] for Patient, from List Provided. CSW Collaboration with [place] April to [employer] with CSW [phone]# [phone]), If She Has Questions, Needs Assistance, or If Additional Social Work Needs Are Identified Between Now & Our Next Scheduled Telephone [employer].      SDOH assessments and interventions completed:  Yes.  Care Coordination Interventions:  Yes, provided.   Follow up plan: Follow up call scheduled for 04/12/2022 at 2:15 pm.  Encounter Outcome:  Pt. Visit Completed.   Nat Christen, BSW, MSW, LCSW  Licensed Education officer, environmental Health System  Mailing [place] [address]. [address] [place], [place] [address] Physical Address[address], [place], [place] [address] Toll Free Main # [phone] Fax # [phone] Cell # [phone] [email or web address]

## 2022-03-29 NOTE — Patient Instructions (Signed)
Visit Information  Thank you for taking time to visit with me today. Please don't hesitate to contact me if I can be of assistance to you.   Following are the goals we discussed today:   Goals Addressed               This Visit's Progress     Assist with Applying for Medicaid and Personal Care Services. (pt-stated)   On track     Care Coordination Interventions:  Interventions Today    Flowsheet Row Most Recent Value  Chronic Disease   Chronic disease during today's visit Diabetes, Chronic Obstructive Pulmonary Disease (COPD), Hypertension (HTN), Other  [History of Prostate Cancer, Requiring Assistance with Activities of Daily Living & Social Isolation.]  General Interventions   General Interventions Discussed/Reviewed General Interventions Discussed, General Interventions Reviewed, Annual Eye Exam, Labs, Annual Foot Exam, Durable Medical Equipment (DME), Vaccines, Health Screening, Doctor Visits, Emergency planning/management officer, Communication with  [employer] Care Provider]  Labs Hgb A1c every 3 months  Vaccines COVID-19, Flu, Pneumonia, RSV, Shingles, Tetanus/Pertussis/Diphtheria  [Encouraged]  Doctor Visits Discussed/Reviewed Doctor Visits Discussed, Doctor Visits Reviewed, Annual Wellness Visits, PCP, Specialist  [Encouraged]  Health Screening Colonoscopy, Prostate  [Encouraged]  Durable Medical Equipment (DME) Glucomoter, Oxygen  PCP/Specialist Visits Compliance with follow-up visit  Communication with PCP/Specialists, RN  Exercise Interventions   Exercise Discussed/Reviewed Exercise Discussed, Exercise Reviewed, Physical Activity, Weight Managment, Assistive device use and maintanence  [Encouraged]  Physical Activity Discussed/Reviewed Physical Activity Discussed, Physical Activity Reviewed, Types of exercise, Home Exercise Program (HEP)  [Encouraged Increased Level of Activity & Exercise]  Weight Management Weight maintenance  [Encouraged]  Education Interventions   Education Provided  Provided Engineer, site, Provided Education, Provided Web-based Education  Provided Verbal Education On Nutrition, Foot Care, Eye Care, Blood Sugar Monitoring, Mental Health/Coping with Illness, Applications, Exercise, Medication, Personal assistant, [employer], When to see the doctor  [Encouraged]  [place] Discussed, Mental Health Reviewed, Coping Strategies, Crisis, Anxiety, Depression, Grief and Loss, Substance Abuse, Suicide  Nutrition Interventions   Nutrition Discussed/Reviewed Nutrition Discussed, Nutrition Reviewed, Adding fruits and vegetables, Fluid intake, Decreasing sugar intake, Increaing proteins, Decreasing fats, Decreasing salt, Supplmental nutrition  Pharmacy Interventions   Pharmacy Dicussed/Reviewed Pharmacy Topics Discussed, Pharmacy Topics Reviewed, Medication Adherence, Affording Medications  Safety Interventions   Safety Discussed/Reviewed Safety Discussed, Safety Reviewed  Advanced Directive Interventions   Advanced Directives Discussed/Reviewed Advanced Directives Discussed, Advanced Directives Reviewed     Active Listening & Reflection Utilized.  Verbalization of Feelings Encouraged.  Emotional Support Provided. Caregiver Stress Acknowledged. Caregiver Resources Reviewed. Caregiver Support Groups Discussed. Self-Enrollment in Caregiver Support Group of Interest Emphasized. Problem Solving Interventions Indicated. Solution-Focused Strategies Activated. Task-Centered Solutions Implemented.   Brief Cognitive Behavioral Therapy Performed. Acceptance & Commitment Therapy Initiated. Client-Centered Therapy Enacted. [place] Offered for Not Receiving the List of [employer], [employer], Mailed on 03/13/2022 & 03/01/2022. CSW Collaboration with [place] April to Request Address to Mail the Following List of [employer]s, [employer], on  03/29/2022: ~ [place] ~ [place] ~ [place] with [place] April to [place] for Medicaid, through The [place] [phone]), Due to [place] Exceeding Poverty Guidelines for [place] for [employer] 2024. CSW Collaboration with [place] April to Explain Patient's Inability to Apply for [place], through  [facility] (# [phone]), Due to Ineligibility for Medicaid, through The [place] [phone]). CSW Collaboration with Salina April to [employer] with [place], [place] or [place] for Patient, from List Provided. CSW Collaboration with Salina April to [employer] with CSW [phone]# [phone]), If She Has Questions, Needs Assistance, or If Additional Social Work Needs Are Identified Between Now & Our Next Scheduled Telephone [employer].      Our next appointment is by telephone on 04/12/2022 at 2:15 pm.  Please call the care guide team at [phone] if you need to cancel or reschedule your appointment.   If you are experiencing a Mental Health or [place] or need someone to talk to, please call the Suicide and Crisis Lifeline: 988 call the [place] National Suicide Prevention Lifeline: (312)187-3014 or TTY: 509-403-5634 TTY 712-077-4719) to talk to a trained counselor call 1-800-273-TALK (toll free, 24 hour hotline) go to [facility] [address] [place] [phone]) call the [place]: [phone] call 911  Patient verbalizes understanding of instructions and care plan provided today and agrees to view in [place]. Active MyChart status and patient understanding of how to access  instructions and care plan via MyChart confirmed with patient.     Telephone follow up appointment with care management team member scheduled for:  04/12/2022 at 2:15 pm.    Nat Christen, BSW, MSW, [place]  Licensed Clinical Social Worker  [place]  Mailing [place]. [address] [place], [place] [address] Physical Address-[address], [place], [place] [address] Toll Free Main # [phone] Fax # [phone] Cell # [phone] [email or web address]

## 2022-03-29 NOTE — Patient Instructions (Signed)
Cefuroxime antibiotic twice per day for 5 days  Follow up in 6 months

## 2022-03-29 NOTE — Progress Notes (Signed)
[facility]  Chief Complaint  Patient presents with   Follow-up    Pt f/u states that his breathing has varied but he hasa been experiencing chest tightness since the beginning of winter     Constitutional:  BP 137/84   Pulse (!) 104   Ht 5\' 7"  (1.702 m)   Wt 120 lb (54.4 kg)   SpO2 92%   BMI 18.79 kg/m   Past Medical History:  Pneumonia, CVA, Depression, HLD, HTN, Prostate cancer  Past Surgical History:  He  has a past surgical history that includes Colonoscopy; Radioactive seed implant (N/A, 04/09/2021); and SPACE OAR INSTILLATION (N/A, 04/09/2021).  Brief Summary:  [name] is a 72 y.o. male former cigarette and marijuana smoker with COPD with emphysema and bronchiectasis, and chronic hypoxic/hypercapnic respiratory failure.       Subjective:   He has noticed more cough and chest congestion with change of season.  He is bring up green sputum and has some wheezing.  He isn't sure if he has a fever.  Not having sinus congestion.  He has trouble carrying his oxygen tanks and doesn't always take these with him when he goes out.  He does use his oxygen when he is at home.  Physical Exam:   Appearance - thin  ENMT - no sinus tenderness, no oral exudate, no LAN, Mallampati 2 airway, no stridor, lipoma Lt jaw line area  Respiratory - decreased breath sounds, scattered rhonchi  CV - s1s2 regular rate and rhythm, no murmurs  Ext - no clubbing, no edema  Skin - no rashes  Psych - normal mood and affect      Pulmonary testing:  ABG on 28% FiO2 12/11/18 >> pH 7.385, PCO2 50.2, PO2 102 PFT 07/29/19 >> FEV1 0.63 (24%), FEV1% 32, TLC 9.24 (143%), DLCO 63%  Chest Imaging:  CT chest 07/05/19 >> mild centrilobular and paraseptal emphysema, BTX lower lobes, bulla RLL LDCT chest 01/23/22 >> advanced emphysema, bronchial wall thickening, apical scarring, 6 mm nodule Rt minor fissure  Social History:  He  reports that he quit  smoking about 6 years ago. His smoking use included cigarettes. He has a 20.00 pack-year smoking history. He has been exposed to tobacco smoke. He has never used smokeless tobacco. He reports that he does not currently use alcohol after a past usage of about 3.0 standard drinks of alcohol per week. He reports that he does not use drugs.  Family History:  His family history is not on file.     Assessment/Plan:   COPD/bronchiectasis exacerbation. - will give him course of cefuroxime  Severe COPD with emphysema. - previously seen by Dr. Luan Pulling and Dr. Melvyn Novas - has been prednisone dependent since July 2021 - continue breztri two puffs bid, prednisone 2.5 mg daily, singulair 10 mg nightly - prn albuterol - he has a spacer device but doesn't use it - he has a nebulizer  Bronchiectasis. - likely from prior episodes of pneumonia - prn mucinex, flutter valve  Chronic hypoxic/hypercapnic respiratory failure. - 3 liters with exertion and sleep - uses [employer] for his DME - discussed importance of maintaining compliance with supplemental oxygen  Lung nodule. - he will need follow up low dose CT chest in January 2025  Social determinants of health. - he has limited reading ability which impacts his ability to follow through with medical instructions  Time Spent Involved in Patient Care on Day of Examination:  36 minutes  Follow  up:   Patient Instructions  Cefuroxime antibiotic twice per day for 5 days  Follow up in 6 months  Medication List:   Allergies as of 03/29/2022   No Known Allergies      Medication List        Accurate as of March 29, 2022 10:52 AM. If you have any questions, ask your nurse or doctor.          STOP taking these medications    Simply Saline 0.9 % Aers Generic drug: Saline Stopped by: Chesley Mires, MD   UNABLE TO FIND Stopped by: Chesley Mires, MD   UNABLE TO FIND Stopped by: Chesley Mires, MD       TAKE these medications    albuterol  (2.5 MG/3ML) 0.083% nebulizer solution Commonly known as: PROVENTIL Take 3 mLs (2.5 mg total) by nebulization every 6 (six) hours as needed for wheezing or shortness of breath.   albuterol 108 (90 Base) MCG/ACT inhaler Commonly known as: VENTOLIN HFA Inhale 2 puffs into the lungs every 6 (six) hours as needed for wheezing or shortness of breath.   amLODipine 10 MG tablet Commonly known as: NORVASC Take 1 tablet (10 mg total) by mouth daily.   aspirin EC 81 MG tablet Take 81 mg by mouth daily. Swallow whole.   Breztri Aerosphere 160-9-4.8 MCG/ACT Aero Generic drug: Budeson-Glycopyrrol-Formoterol INHALE 2 PUFFS FIRST THING IN THE MORNING AND THEN ANOTHER 2 PUFFS ABOUT 12 HOURS LATER.   cefUROXime 500 MG tablet Commonly known as: CEFTIN Take 1 tablet (500 mg total) by mouth 2 (two) times daily with a meal. Started by: Chesley Mires, MD   metFORMIN 500 MG tablet Commonly known as: GLUCOPHAGE Take 1 tablet (500 mg total) by mouth 2 (two) times daily with a meal.   montelukast 10 MG tablet Commonly known as: SINGULAIR Take 1 tablet (10 mg total) by mouth at bedtime.   predniSONE 5 MG tablet Commonly known as: DELTASONE TAKE 1 TABLET EVERY DAY WITH BREAKFAST   rosuvastatin 20 MG tablet Commonly known as: Crestor Take 1 tablet (20 mg total) by mouth daily.   triamterene-hydrochlorothiazide 37.5-25 MG tablet Commonly known as: MAXZIDE-25 Take one and a half tablets by mouth once daily        Signature:  Chesley Mires, MD [place] Pager - [phone] 03/29/2022, 10:52 AM

## 2022-04-12 ENCOUNTER — Ambulatory Visit: Payer: Self-pay | Admitting: *Deleted

## 2022-04-12 ENCOUNTER — Encounter: Payer: Self-pay | Admitting: *Deleted

## 2022-04-12 NOTE — Patient Instructions (Signed)
Visit Information  Thank you for taking time to visit with me today. Please don't hesitate to contact me if I can be of assistance to you.   Following are the goals we discussed today:   Goals Addressed               This Visit's Progress     Assist with Applying for Medicaid and Personal Care Services. (pt-stated)   On track     Care Coordination Interventions:  Interventions Today    Flowsheet Row Most Recent Value  Chronic Disease   Chronic disease during today's visit Diabetes, Chronic Obstructive Pulmonary Disease (COPD), Hypertension (HTN), Other  [History of Prostate Cancer, Requiring Assistance with Activities of Daily Living & Social Isolation.]  General Interventions   General Interventions Discussed/Reviewed General Interventions Discussed, General Interventions Reviewed, Annual Eye Exam, Labs, Annual Foot Exam, Durable Medical Equipment (DME), Vaccines, Health Screening, Doctor Visits, Programmer, applications, Communication with  [employer] [facility] Care Care Provider]  Labs Hgb A1c every 3 months  Vaccines COVID-19, Flu, Pneumonia, RSV, Shingles, Tetanus/Pertussis/Diphtheria  [Encouraged]  Doctor Visits Discussed/Reviewed Doctor Visits Discussed, Doctor Visits Reviewed, Annual Wellness Visits, PCP, Specialist  [Encouraged]  Health Screening Colonoscopy, Prostate  [Encouraged]  Durable Medical Equipment (DME) Glucomoter, Oxygen  PCP/Specialist Visits Compliance with follow-up visit  Communication with PCP/Specialists, RN  Exercise Interventions   Exercise Discussed/Reviewed Exercise Discussed, Exercise Reviewed, Physical Activity, Weight Managment, Assistive device use and maintanence  [Encouraged]  Physical Activity Discussed/Reviewed Physical Activity Discussed, Physical Activity Reviewed, Types of exercise, Home Exercise Program (HEP)  [Encouraged Increased Level of Activity & Exercise]  Weight Management Weight maintenance  [Encouraged]  Education Interventions   Education Provided  Provided Therapist, sports, Provided Education, Provided Web-based Education  Provided Verbal Education On Nutrition, Foot Care, Eye Care, Blood Sugar Monitoring, Mental Health/Coping with Illness, Applications, Exercise, Medication, Development worker, community, [employer], When to see the doctor  [Encouraged]  Mental Health Interventions   Mental Health Discussed/Reviewed Mental Health Discussed, Mental Health Reviewed, Coping Strategies, Crisis, Anxiety, Depression, Grief and Loss, Substance Abuse, Suicide  Nutrition Interventions   Nutrition Discussed/Reviewed Nutrition Discussed, Nutrition Reviewed, Adding fruits and vegetables, Fluid intake, Decreasing sugar intake, Increaing proteins, Decreasing fats, Decreasing salt, Supplmental nutrition  Pharmacy Interventions   Pharmacy Dicussed/Reviewed Pharmacy Topics Discussed, Pharmacy Topics Reviewed, Medication Adherence, Affording Medications  Safety Interventions   Safety Discussed/Reviewed Safety Discussed, Safety Reviewed  Advanced Directive Interventions   Advanced Directives Discussed/Reviewed Advanced Directives Discussed, Advanced Directives Reviewed     Active Listening & Reflection Utilized.  Verbalization of Feelings Encouraged.  Emotional Support Provided. Caregiver Stress Acknowledged. Caregiver Resources Reviewed. Caregiver Support Groups Discussed. Self-Enrollment in Caregiver Support Group of Interest Emphasized. Problem Solving Interventions Indicated. Solution-Focused Strategies Activated. Task-Centered Solutions Implemented.   Brief Cognitive Behavioral Therapy Performed. Acceptance & Commitment Therapy Initiated. Client-Centered Therapy Enacted. [place]rest Apologies Offered for Not Receiving the List of [employer], Mailed on 03/13/2022 & 03/01/2022. CSW Collaboration with Morton Stall to Confirm Receipt & Thoroughly Review the Following List of [employer], Services &  Resources: ~ In-Home Care & Respite Agencies ~ Home Health Care Agencies ~ Respite Care Agencies & Facilities CSW Collaboration with Clearence Cheek, Lubertha Basque to [employer] with CSW [phone]# [phone]), If She Has Questions, Needs Assistance, or If Additional Social Work Needs Are Identified Between Now & Our Next Scheduled Telephone [employer].      Our next appointment is by telephone on 04/25/2022 at 11:00 am.  Please call the care  guide team at [phone] if you need to cancel or reschedule your appointment.   If you are experiencing a Mental Health or Behavioral Health Crisis or need someone to talk to, please call the Suicide and Crisis Lifeline: 988 call the [place] National Suicide Prevention Lifeline: 864-474-8536 or TTY: 518 284 0776 TTY (318)636-6296) to talk to a trained counselor call 1-800-273-TALK (toll free, 24 hour hotline) go to [facility] [address] [place] [phone]) call the [facility] Crisis Line: [phone] call 911  Patient verbalizes understanding of instructions and care plan provided today and agrees to view in MyChart. Active MyChart status and patient understanding of how to access instructions and care plan via MyChart confirmed with patient.     Telephone follow up appointment with care management team member scheduled for:  04/25/2022 at 11:00 am.  Danford Bad, BSW, MSW, LCSW  Licensed Clinical Social Worker  [facility]  Mailing [place]. [address] [place], [place] [address] Physical Address-[address] [place], [place] [address] Toll Free Main # [phone] Fax # [phone] Cell # [phone] [email or web address]

## 2022-04-12 NOTE — Patient Outreach (Signed)
Care Coordination   Follow Up Visit Note   04/12/2022  Name: ROBBIE DOU [name]: [id number] DOB: 1950/03/08  URYAH CLENDANIEL is a 72 y.o. year old male who sees Lodema Hong, Milus Mallick, MD for primary care. I spoke with [place] Oaks & friend, Lubertha Basque by phone today.  What matters to the patients health and wellness today?  Assist with Applying for Medicaid and Personal Care Services.   Goals Addressed               This Visit's Progress     Assist with Applying for Medicaid and Personal Care Services. (pt-stated)   On track     Care Coordination Interventions:  Interventions Today    Flowsheet Row Most Recent Value  Chronic Disease   Chronic disease during today's visit Diabetes, Chronic Obstructive Pulmonary Disease (COPD), Hypertension (HTN), Other  [History of Prostate Cancer, Requiring Assistance with Activities of Daily Living & Social Isolation.]  General Interventions   General Interventions Discussed/Reviewed General Interventions Discussed, General Interventions Reviewed, Annual Eye Exam, Labs, Annual Foot Exam, Durable Medical Equipment (DME), Vaccines, Health Screening, Doctor Visits, Programmer, applications, Communication with  [employer] [facility] Care Care Provider]  Labs Hgb A1c every 3 months  Vaccines COVID-19, Flu, Pneumonia, RSV, Shingles, Tetanus/Pertussis/Diphtheria  [Encouraged]  Doctor Visits Discussed/Reviewed Doctor Visits Discussed, Doctor Visits Reviewed, Annual Wellness Visits, PCP, Specialist  [Encouraged]  Health Screening Colonoscopy, Prostate  [Encouraged]  Durable Medical Equipment (DME) Glucomoter, Oxygen  PCP/Specialist Visits Compliance with follow-up visit  Communication with PCP/Specialists, RN  Exercise Interventions   Exercise Discussed/Reviewed Exercise Discussed, Exercise Reviewed, Physical Activity, Weight Managment, Assistive device use and maintanence  [Encouraged]  Physical Activity Discussed/Reviewed Physical Activity Discussed, Physical Activity  Reviewed, Types of exercise, Home Exercise Program (HEP)  [Encouraged Increased Level of Activity & Exercise]  Weight Management Weight maintenance  [Encouraged]  Education Interventions   Education Provided Provided Therapist, sports, Provided Education, Provided Web-based Education  Provided Verbal Education On Nutrition, Foot Care, Eye Care, Blood Sugar Monitoring, Mental Health/Coping with Illness, Applications, Exercise, Medication, Development worker, community, [employer], When to see the doctor  [Encouraged]  Mental Health Interventions   Mental Health Discussed/Reviewed Mental Health Discussed, Mental Health Reviewed, Coping Strategies, Crisis, Anxiety, Depression, Grief and Loss, Substance Abuse, Suicide  Nutrition Interventions   Nutrition Discussed/Reviewed Nutrition Discussed, Nutrition Reviewed, Adding fruits and vegetables, Fluid intake, Decreasing sugar intake, Increaing proteins, Decreasing fats, Decreasing salt, Supplmental nutrition  Pharmacy Interventions   Pharmacy Dicussed/Reviewed Pharmacy Topics Discussed, Pharmacy Topics Reviewed, Medication Adherence, Affording Medications  Safety Interventions   Safety Discussed/Reviewed Safety Discussed, Safety Reviewed  Advanced Directive Interventions   Advanced Directives Discussed/Reviewed Advanced Directives Discussed, Advanced Directives Reviewed     Active Listening & Reflection Utilized.  Verbalization of Feelings Encouraged.  Emotional Support Provided. Caregiver Stress Acknowledged. Caregiver Resources Reviewed. Caregiver Support Groups Discussed. Self-Enrollment in Caregiver Support Group of Interest Emphasized. Problem Solving Interventions Indicated. Solution-Focused Strategies Activated. Task-Centered Solutions Implemented.   Brief Cognitive Behavioral Therapy Performed. Acceptance & Commitment Therapy Initiated. Client-Centered Therapy Enacted. Sincerest Apologies Offered for Not Receiving the List of [employer], [employer], Mailed on 03/13/2022 & 03/01/2022. CSW Collaboration with Morton Stall to Confirm Receipt & Thoroughly Review the Following List of [employer], Services & Resources: ~ [employer] Care & Respite Agencies ~ Home Health Care Agencies ~ Respite Care Agencies & Facilities CSW Collaboration with Clearence Cheek, Lubertha Basque to [employer] with CSW [phone]# [phone]), If She Has Questions, Needs Assistance, or  If Additional Social Work Needs Are Identified Between Now & Our Next Scheduled Telephone [employer] Call.      SDOH assessments and interventions completed:  Yes.  Care Coordination Interventions:  Yes, provided.   Follow up plan: Follow up call scheduled for 04/25/2022 at 11:00 am.   Encounter Outcome:  Pt. Visit Completed.   Danford BadJoanna Daden Mahany, BSW, MSW, LCSW  Licensed Restaurant manager, fast foodClinical Social Worker  [facility]  [place]ailing [place], [place], [place] [address] Physical Address-[address], [place], [place] [address] Toll Free Main # [phone] Fax # [phone] Cell # [phone] [email or web address]

## 2022-04-19 ENCOUNTER — Other Ambulatory Visit: Payer: Self-pay | Admitting: Pulmonary Disease

## 2022-04-25 ENCOUNTER — Ambulatory Visit: Payer: Self-pay | Admitting: *Deleted

## 2022-04-25 ENCOUNTER — Encounter: Payer: Self-pay | Admitting: *Deleted

## 2022-04-25 NOTE — Patient Instructions (Signed)
Visit Information  Thank you for taking time to visit with me today. Please don't hesitate to contact me if I can be of assistance to you.   Following are the goals we discussed today:   Goals Addressed               This Visit's Progress     COMPLETED: Assist with Applying for Medicaid and Personal Care Services. (pt-stated)   On track     Care Coordination Interventions:  Interventions Today    Flowsheet Row Most Recent Value  Chronic Disease   Chronic disease during today's visit Diabetes, Chronic Obstructive Pulmonary Disease (COPD), Hypertension (HTN), Other  [History of Prostate Cancer, Requiring Assistance with Activities of Daily Living & Social Isolation.]  General Interventions   General Interventions Discussed/Reviewed General Interventions Discussed, General Interventions Reviewed, Annual Eye Exam, Labs, Annual Foot Exam, Durable Medical Equipment (DME), Vaccines, Health Screening, Doctor Visits, Programmer, applications, Communication with  [facility] Care Provider]  Labs Hgb A1c every 3 months  Vaccines COVID-19, Flu, Pneumonia, RSV, Shingles, Tetanus/Pertussis/Diphtheria  [Encouraged]  Doctor Visits Discussed/Reviewed Doctor Visits Discussed, Doctor Visits Reviewed, Annual Wellness Visits, PCP, Specialist  [Encouraged]  Health Screening Colonoscopy, Prostate  [Encouraged]  Durable Medical Equipment (DME) Glucomoter, Oxygen  PCP/Specialist Visits Compliance with follow-up visit  Communication with PCP/Specialists, RN  Exercise Interventions   Exercise Discussed/Reviewed Exercise Discussed, Exercise Reviewed, Physical Activity, Weight Managment, Assistive device use and maintanence  [Encouraged]  Physical Activity Discussed/Reviewed Physical Activity Discussed, Physical Activity Reviewed, Types of exercise, Home Exercise Program (HEP)  [Encouraged Increased Level of Activity & Exercise]  Weight Management Weight maintenance  [Encouraged]  Education Interventions   Education  Provided Provided Therapist, sports, Provided Education, Provided Web-based Education  Provided Verbal Education On Nutrition, Foot Care, Eye Care, Blood Sugar Monitoring, Mental Health/Coping with Illness, Applications, Exercise, Medication, Development worker, community, [employer], When to see the doctor  [Encouraged]  Mental Health Interventions   Mental Health Discussed/Reviewed Mental Health Discussed, Mental Health Reviewed, Coping Strategies, Crisis, Anxiety, Depression, Grief and Loss, Substance Abuse, Suicide  Nutrition Interventions   Nutrition Discussed/Reviewed Nutrition Discussed, Nutrition Reviewed, Adding fruits and vegetables, Fluid intake, Decreasing sugar intake, Increaing proteins, Decreasing fats, Decreasing salt, Supplmental nutrition  Pharmacy Interventions   Pharmacy Dicussed/Reviewed Pharmacy Topics Discussed, Pharmacy Topics Reviewed, Medication Adherence, Affording Medications  Safety Interventions   Safety Discussed/Reviewed Safety Discussed, Safety Reviewed  Advanced Directive Interventions   Advanced Directives Discussed/Reviewed Advanced Directives Discussed, Advanced Directives Reviewed     Active Listening & Reflection Utilized.  Verbalization of Feelings Encouraged.  Emotional Support Provided. Caregiver Stress Acknowledged. Caregiver Resources Reviewed. Caregiver Support Groups Discussed. Self-Enrollment in Caregiver Support Group of Interest Emphasized. Problem Solving Interventions Indicated. Solution-Focused Strategies Activated. Task-Centered Solutions Implemented.   Brief Cognitive Behavioral Therapy Performed. Acceptance & Commitment Therapy Initiated. Client-Centered Therapy Enacted. CSW Collaboration with Morton Stall to [employer] with The Following List of [employer], Services & Resources, in An Effort to Obtain Care & Supervision for Patient: ~ In-Home Care & Respite Agencies ~ Home Health Care Agencies ~ Respite Care  Agencies & Facilities CSW Collaboration with Clearence Cheek, Lubertha Basque to [employer] with CSW [phone]# [phone]), If She Has Questions, Needs Assistance, or If Additional Social Work Needs Are Identified in The Near Future.      Please call the care guide team at [phone] if you need to cancel or reschedule your appointment.   If you are experiencing a Mental Health or Behavioral Health Crisis or  need someone to talk to, please call the Suicide and Crisis Lifeline: 988 call the [place] National Suicide Prevention Lifeline: 548-527-9036 or TTY: (925)241-9162 TTY 2792643386) to talk to a trained counselor call 1-800-273-TALK (toll free, 24 hour hotline) go to [facility] [address] [place] [phone]) call the Hope: [phone] call 911  Patient verbalizes understanding of instructions and care plan provided today and agrees to view in [place]. Active MyChart status and patient understanding of how to access instructions and care plan via MyChart confirmed with patient.     No further follow up required.  Nat Christen, BSW, MSW, LCSW  Licensed Education officer, environmental [facility]  Mailing [place] [address] [place], [place] [address] Physical Address-[address], [place], [place] [address] Toll Free Main # [phone] Fax # [phone] Cell # [phone] [email or web address]

## 2022-04-25 NOTE — Patient Outreach (Signed)
Care Coordination   Follow Up Visit Note   04/25/2022  Name: [name] [name]: [id number] DOB: [birth year]  [name] is a 72 y.o. year old male who sees [name], Ian Mallick, MD for primary care. I spoke with [name] and friend, [name] by phone today.  What matters to the patients health and wellness today?  Assist with Applying for Medicaid and Personal Care Services.   Goals Addressed               This Visit's Progress     COMPLETED: Assist with Applying for Medicaid and Personal Care Services. (pt-stated)   On track     Care Coordination Interventions:  Interventions Today    Flowsheet Row Most Recent Value  Chronic Disease   Chronic disease during today's visit Diabetes, Chronic Obstructive Pulmonary Disease (COPD), Hypertension (HTN), Other  [History of Prostate Cancer, Requiring Assistance with Activities of Daily Living & Social Isolation.]  General Interventions   General Interventions Discussed/Reviewed General Interventions Discussed, General Interventions Reviewed, Annual Eye Exam, Labs, Annual Foot Exam, Durable Medical Equipment (DME), Vaccines, Health Screening, Doctor Visits, Programmer, applications, Communication with  [facility] Care Provider]  Labs Hgb A1c every 3 months  Vaccines COVID-19, Flu, Pneumonia, RSV, Shingles, Tetanus/Pertussis/Diphtheria  [Encouraged]  Doctor Visits Discussed/Reviewed Doctor Visits Discussed, Doctor Visits Reviewed, Annual Wellness Visits, PCP, Specialist  [Encouraged]  Health Screening Colonoscopy, Prostate  [Encouraged]  Durable Medical Equipment (DME) Glucomoter, Oxygen  PCP/Specialist Visits Compliance with follow-up visit  Communication with PCP/Specialists, RN  Exercise Interventions   Exercise Discussed/Reviewed Exercise Discussed, Exercise Reviewed, Physical Activity, Weight Managment, Assistive device use and maintanence  [Encouraged]  Physical Activity Discussed/Reviewed Physical Activity Discussed,  Physical Activity Reviewed, Types of exercise, Home Exercise Program (HEP)  [Encouraged Increased Level of Activity & Exercise]  Weight Management Weight maintenance  [Encouraged]  Education Interventions   Education Provided Provided Therapist, sports, Provided Education, Provided Web-based Education  Provided Verbal Education On Nutrition, Foot Care, Eye Care, Blood Sugar Monitoring, Mental Health/Coping with Illness, Applications, Exercise, Medication, Development worker, community, [employer], When to see the doctor  [Encouraged]  Mental Health Interventions   Mental Health Discussed/Reviewed Mental Health Discussed, Mental Health Reviewed, Coping Strategies, Crisis, Anxiety, Depression, Grief and Loss, Substance Abuse, Suicide  Nutrition Interventions   Nutrition Discussed/Reviewed Nutrition Discussed, Nutrition Reviewed, Adding fruits and vegetables, Fluid intake, Decreasing sugar intake, Increaing proteins, Decreasing fats, Decreasing salt, Supplmental nutrition  Pharmacy Interventions   Pharmacy Dicussed/Reviewed Pharmacy Topics Discussed, Pharmacy Topics Reviewed, Medication Adherence, Affording Medications  Safety Interventions   Safety Discussed/Reviewed Safety Discussed, Safety Reviewed  Advanced Directive Interventions   Advanced Directives Discussed/Reviewed Advanced Directives Discussed, Advanced Directives Reviewed     Active Listening & Reflection Utilized.  Verbalization of Feelings Encouraged.  Emotional Support Provided. Caregiver Stress Acknowledged. Caregiver Resources Reviewed. Caregiver Support Groups Discussed. Self-Enrollment in Caregiver Support Group of Interest Emphasized. Problem Solving Interventions Indicated. Solution-Focused Strategies Activated. Task-Centered Solutions Implemented.   Brief Cognitive Behavioral Therapy Performed. Acceptance & Commitment Therapy Initiated. Client-Centered Therapy Enacted. CSW Collaboration with [name] to  [employer] with The Following List of [name], Services & Resources, in An Effort to Obtain Care & Supervision for Patient: ~ In-Home Care & Respite Agencies ~ Home Health Care Agencies ~ Respite Care Agencies & Facilities CSW Collaboration with [name], [name] to [employer] with CSW [phone]# [phone]), If She Has Questions, Needs Assistance, or If Additional Social Work Needs Are Identified in The Near  Future.      SDOH assessments and interventions completed:  Yes.  Care Coordination Interventions:  Yes, provided.   Follow up plan: No further intervention required.   Encounter Outcome:  Pt. Visit Completed.   [name], BSW, MSW, LCSW  Licensed Restaurant manager, fast food [facility] System  Mailing [place] [address] [place], [place] [address] Physical Address-[address], [place], [place] [address] Toll Free Main # [phone] Fax # [phone] Cell # [phone] [name]

## 2022-04-30 ENCOUNTER — Other Ambulatory Visit: Payer: Self-pay | Admitting: Pulmonary Disease

## 2022-06-04 ENCOUNTER — Other Ambulatory Visit: Payer: Self-pay | Admitting: Pulmonary Disease

## 2022-06-20 ENCOUNTER — Telehealth: Payer: Self-pay

## 2022-06-20 MED ORDER — BREZTRI AEROSPHERE 160-9-4.8 MCG/ACT IN AERO: 2.0000 | INHALATION_SPRAY | Freq: Two times a day (BID) | RESPIRATORY_TRACT | 0 refills | Status: DC

## 2022-06-20 NOTE — Telephone Encounter (Signed)
Pt presented to [facility] today for samples of Breztri. He was provided 2 samples and NFN att.

## 2022-07-05 ENCOUNTER — Other Ambulatory Visit: Payer: Self-pay | Admitting: Family Medicine

## 2022-07-05 DIAGNOSIS — E119 Type 2 diabetes mellitus without complications: Secondary | ICD-10-CM

## 2022-07-16 ENCOUNTER — Other Ambulatory Visit: Payer: Self-pay | Admitting: Pulmonary Disease

## 2022-07-22 ENCOUNTER — Telehealth: Payer: Self-pay

## 2022-07-22 NOTE — Telephone Encounter (Signed)
Patient came into office to request Breztri sample. Per chart patient given 2 samples 06/20/22. Sample given to patient by [facility].   Breztri [id number] C00, exp 12/26

## 2022-07-24 ENCOUNTER — Other Ambulatory Visit: Payer: Self-pay | Admitting: Pulmonary Disease

## 2022-07-24 ENCOUNTER — Other Ambulatory Visit: Payer: Self-pay

## 2022-07-24 DIAGNOSIS — J449 Chronic obstructive pulmonary disease, unspecified: Secondary | ICD-10-CM

## 2022-07-24 MED ORDER — BREZTRI AEROSPHERE 160-9-4.8 MCG/ACT IN AERO: 2.0000 | INHALATION_SPRAY | Freq: Two times a day (BID) | RESPIRATORY_TRACT | 5 refills | Status: DC

## 2022-07-31 ENCOUNTER — Other Ambulatory Visit: Payer: Self-pay | Admitting: Pulmonary Disease

## 2022-08-06 ENCOUNTER — Other Ambulatory Visit: Payer: Self-pay | Admitting: Family Medicine

## 2022-08-08 ENCOUNTER — Telehealth: Payer: Self-pay | Admitting: Pulmonary Disease

## 2022-08-08 NOTE — Telephone Encounter (Signed)
Patient states the Breztri inhaler that he started is not helping him at all. Please advise on an alternative.

## 2022-08-13 MED ORDER — TRELEGY ELLIPTA 100-62.5-25 MCG/ACT IN AEPB: 1.0000 | INHALATION_SPRAY | Freq: Every day | RESPIRATORY_TRACT | 1 refills | Status: DC

## 2022-08-13 NOTE — Telephone Encounter (Signed)
New order for trelegy sent.  Patient aware.

## 2022-08-13 NOTE — Telephone Encounter (Signed)
Please send script for trelegy 100 one puff daily.  He should stop using breztri once he starts using trelegy.  He needs to rinse his mouth after each use of trelegy.

## 2022-08-23 ENCOUNTER — Ambulatory Visit (INDEPENDENT_AMBULATORY_CARE_PROVIDER_SITE_OTHER): Payer: Medicare HMO | Admitting: Family Medicine

## 2022-08-23 ENCOUNTER — Encounter: Payer: Self-pay | Admitting: Family Medicine

## 2022-08-23 VITALS — BP 129/71 | HR 98 | Ht 67.0 in | Wt 123.0 lb

## 2022-08-23 DIAGNOSIS — E1169 Type 2 diabetes mellitus with other specified complication: Secondary | ICD-10-CM | POA: Diagnosis not present

## 2022-08-23 DIAGNOSIS — E118 Type 2 diabetes mellitus with unspecified complications: Secondary | ICD-10-CM | POA: Diagnosis not present

## 2022-08-23 DIAGNOSIS — C61 Malignant neoplasm of prostate: Secondary | ICD-10-CM | POA: Diagnosis not present

## 2022-08-23 DIAGNOSIS — E0959 Drug or chemical induced diabetes mellitus with other circulatory complications: Secondary | ICD-10-CM

## 2022-08-23 DIAGNOSIS — Z122 Encounter for screening for malignant neoplasm of respiratory organs: Secondary | ICD-10-CM

## 2022-08-23 DIAGNOSIS — E785 Hyperlipidemia, unspecified: Secondary | ICD-10-CM

## 2022-08-23 DIAGNOSIS — I10 Essential (primary) hypertension: Secondary | ICD-10-CM

## 2022-08-23 NOTE — Patient Instructions (Addendum)
F/U  in 6 months, call if you need me sooner'  Please get fasting labs next Monday  Chest scan for lung cancer screening will be scheduled for January, 2025   Nurse pls print RSV vaccine for pt to take to get at pharmacy  Need flu and covid vaccines  Continue to wear your mask  Thanks for choosing [facility], we consider it a privelige to serve you.

## 2022-08-26 ENCOUNTER — Encounter: Payer: Self-pay | Admitting: Family Medicine

## 2022-08-26 DIAGNOSIS — E785 Hyperlipidemia, unspecified: Secondary | ICD-10-CM | POA: Diagnosis not present

## 2022-08-26 DIAGNOSIS — E118 Type 2 diabetes mellitus with unspecified complications: Secondary | ICD-10-CM | POA: Diagnosis not present

## 2022-08-26 DIAGNOSIS — E1169 Type 2 diabetes mellitus with other specified complication: Secondary | ICD-10-CM | POA: Diagnosis not present

## 2022-08-26 NOTE — Assessment & Plan Note (Signed)
DASH diet and commitment to daily physical activity for a minimum of 30 minutes discussed and encouraged, as a part of hypertension management. The importance of attaining a healthy weight is also discussed.     08/23/2022   10:25 AM 08/23/2022   10:24 AM 03/29/2022   10:28 AM 02/21/2022   11:04 AM 12/11/2021   11:01 AM 12/11/2021   10:51 AM 12/11/2021   10:48 AM  BP/Weight  Systolic BP 129 147 137 132 124 138 145  Diastolic BP 71 73 84 81 72 82 86  Wt. (Lbs)  123 120 121.12   121  BMI  19.26 kg/m2 18.79 kg/m2 18.97 kg/m2   18.95 kg/m2

## 2022-08-26 NOTE — Assessment & Plan Note (Signed)
[name] is reminded of the importance of commitment to daily physical activity for 30 minutes or more, as able and the need to limit carbohydrate intake to 30 to 60 grams per meal to help with blood sugar control.   The need to take medication as prescribed, test blood sugar as directed, and to call between visits if there is a concern that blood sugar is uncontrolled is also discussed.   [name] is reminded of the importance of daily foot exam, annual eye examination, and good blood sugar, blood pressure and cholesterol control.     Latest Ref Rng & Units 02/19/2022    9:02 AM 10/25/2021   10:30 AM 07/18/2021    8:52 AM 05/18/2021    1:48 PM 04/05/2021    9:10 AM  Diabetic Labs  HbA1c 4.8 - 5.6 % 6.4  6.5      Micro/Creat Ratio 0 - 29 mg/g creat  32      Chol 100 - 199 mg/dL 161  096  045     HDL >40 mg/dL 75  84  85     Calc LDL 0 - 99 mg/dL 67  981  90     Triglycerides 0 - 149 mg/dL 54  67  35     Creatinine 0.76 - 1.27 mg/dL 1.91  4.78  2.95  6.21  0.84       08/23/2022   10:25 AM 08/23/2022   10:24 AM 03/29/2022   10:28 AM 02/21/2022   11:04 AM 12/11/2021   11:01 AM 12/11/2021   10:51 AM 12/11/2021   10:48 AM  BP/Weight  Systolic BP 129 147 137 132 124 138 145  Diastolic BP 71 73 84 81 72 82 86  Wt. (Lbs)  123 120 121.12   121  BMI  19.26 kg/m2 18.79 kg/m2 18.97 kg/m2   18.95 kg/m2      Latest Ref Rng & Units 10/25/2021    9:40 AM 06/06/2020   12:00 AM  Foot/eye exam completion dates  Eye Exam No Retinopathy No Retinopathy  No Retinopathy       No Retinopathy      Foot Form Completion  Done      This result is from an external source.   Multiple values from one day are sorted in reverse-chronological order    Controlled, no change in medication Updated lab needed at/ before next visit.

## 2022-08-26 NOTE — Progress Notes (Signed)
[name]     [name]: [id number]      DOB: 01-24-1950  Chief Complaint  Patient presents with   Follow-up    HPI [name] is here for follow up and re-evaluation of chronic medical conditions, medication management and review of any available recent lab and radiology data.  Preventive health is updated, specifically  Cancer screening and Immunization.   Questions or concerns regarding consultations or procedures which the PT has had in the interim are  addressed. The PT denies any adverse reactions to current medications since the last visit.  There are no new concerns.  There are no specific complaints   ROS Denies recent fever or chills. Denies sinus pressure, nasal congestion, ear pain or sore throat. Denies chest congestion, productive cough or wheezing. Denies chest pains, palpitations and leg swelling Denies abdominal pain, nausea, vomiting,diarrhea or constipation.   Denies dysuria, frequency, hesitancy or incontinence. Denies joint pain, swelling and limitation in mobility. Denies headaches, seizures, numbness, or tingling. Denies depression, anxiety or insomnia. Denies skin break down or rash.   PE  BP 129/71   Pulse 98   Ht 5\' 7"  (1.702 m)   Wt 123 lb (55.8 kg)   SpO2 92%   BMI 19.26 kg/m   Patient alert and oriented and in no cardiopulmonary distress.  HEENT: No facial asymmetry, EOMI,     Neck supple .  Chest: Clear to auscultation bilaterally.  CVS: S1, S2 no murmurs, no S3.Regular rate.  ABD: Soft non tender.   Ext: No edema  MS: Adequate ROM spine, shoulders, hips and knees.  Skin: Intact, no ulcerations or rash noted.  Psych: Good eye contact, normal affect. Memory intact not anxious or depressed appearing.  CNS: CN 2-12 intact, power,  normal throughout.no focal deficits noted.   Assessment & Plan  Essential hypertension DASH diet and commitment to daily physical activity for a minimum of 30 minutes discussed and encouraged, as a  part of hypertension management. The importance of attaining a healthy weight is also discussed.     08/23/2022   10:25 AM 08/23/2022   10:24 AM 03/29/2022   10:28 AM 02/21/2022   11:04 AM 12/11/2021   11:01 AM 12/11/2021   10:51 AM 12/11/2021   10:48 AM  BP/Weight  Systolic BP 129 147 137 132 124 138 145  Diastolic BP 71 73 84 81 72 82 86  Wt. (Lbs)  123 120 121.12   121  BMI  19.26 kg/m2 18.79 kg/m2 18.97 kg/m2   18.95 kg/m2       DM (diabetes mellitus) (HCC) [name] is reminded of the importance of commitment to daily physical activity for 30 minutes or more, as able and the need to limit carbohydrate intake to 30 to 60 grams per meal to help with blood sugar control.   The need to take medication as prescribed, test blood sugar as directed, and to call between visits if there is a concern that blood sugar is uncontrolled is also discussed.   [name] is reminded of the importance of daily foot exam, annual eye examination, and good blood sugar, blood pressure and cholesterol control.     Latest Ref Rng & Units 02/19/2022    9:02 AM 10/25/2021   10:30 AM 07/18/2021    8:52 AM 05/18/2021    1:48 PM 04/05/2021    9:10 AM  Diabetic Labs  HbA1c 4.8 - 5.6 % 6.4  6.5      Micro/Creat Ratio 0 -  29 mg/g creat  32      Chol 100 - 199 mg/dL 829  562  130     HDL >86 mg/dL 75  84  85     Calc LDL 0 - 99 mg/dL 67  578  90     Triglycerides 0 - 149 mg/dL 54  67  35     Creatinine 0.76 - 1.27 mg/dL 4.69  6.29  5.28  4.13  0.84       08/23/2022   10:25 AM 08/23/2022   10:24 AM 03/29/2022   10:28 AM 02/21/2022   11:04 AM 12/11/2021   11:01 AM 12/11/2021   10:51 AM 12/11/2021   10:48 AM  BP/Weight  Systolic BP 129 147 137 132 124 138 145  Diastolic BP 71 73 84 81 72 82 86  Wt. (Lbs)  123 120 121.12   121  BMI  19.26 kg/m2 18.79 kg/m2 18.97 kg/m2   18.95 kg/m2      Latest Ref Rng & Units 10/25/2021    9:40 AM 06/06/2020   12:00 AM  Foot/eye exam completion dates  Eye Exam No  Retinopathy No Retinopathy  No Retinopathy       No Retinopathy      Foot Form Completion  Done      This result is from an external source.   Multiple values from one day are sorted in reverse-chronological order    Controlled, no change in medication Updated lab needed at/ before next visit.

## 2022-08-27 LAB — CMP14+EGFR
ALT: 20 IU/L (ref 0–44)
AST: 19 IU/L (ref 0–40)
Albumin: 4.3 g/dL (ref 3.8–4.8)
Alkaline Phosphatase: 72 IU/L (ref 44–121)
BUN/Creatinine Ratio: 10 (ref 10–24)
BUN: 9 mg/dL (ref 8–27)
Bilirubin Total: 0.5 mg/dL (ref 0.0–1.2)
CO2: 30 mmol/L — ABNORMAL HIGH (ref 20–29)
Calcium: 9.7 mg/dL (ref 8.6–10.2)
Chloride: 99 mmol/L (ref 96–106)
Creatinine, Ser: 0.9 mg/dL (ref 0.76–1.27)
Globulin, Total: 2 g/dL (ref 1.5–4.5)
Glucose: 122 mg/dL — ABNORMAL HIGH (ref 70–99)
Potassium: 3.6 mmol/L (ref 3.5–5.2)
Sodium: 142 mmol/L (ref 134–144)
Total Protein: 6.3 g/dL (ref 6.0–8.5)
eGFR: 90 mL/min/{1.73_m2} (ref >59)

## 2022-08-27 LAB — LIPID PANEL
Chol/HDL Ratio: 1.8 ratio (ref 0.0–5.0)
Cholesterol, Total: 157 mg/dL (ref 100–199)
HDL: 85 mg/dL (ref >39)
LDL Chol Calc (NIH): 59 mg/dL (ref 0–99)
Triglycerides: 64 mg/dL (ref 0–149)
VLDL Cholesterol Cal: 13 mg/dL (ref 5–40)

## 2022-08-27 LAB — HEMOGLOBIN A1C
Est. average glucose Bld gHb Est-mCnc: 146 mg/dL
Hgb A1c MFr Bld: 6.7 % — ABNORMAL HIGH (ref 4.8–5.6)

## 2022-08-28 ENCOUNTER — Ambulatory Visit: Payer: Medicare HMO

## 2022-08-28 LAB — HM DIABETES EYE EXAM

## 2022-08-31 ENCOUNTER — Other Ambulatory Visit: Payer: Self-pay | Admitting: Pulmonary Disease

## 2022-09-03 ENCOUNTER — Other Ambulatory Visit: Payer: Self-pay

## 2022-09-03 ENCOUNTER — Other Ambulatory Visit: Payer: Medicare HMO

## 2022-09-03 DIAGNOSIS — R972 Elevated prostate specific antigen [PSA]: Secondary | ICD-10-CM | POA: Diagnosis not present

## 2022-09-03 DIAGNOSIS — C61 Malignant neoplasm of prostate: Secondary | ICD-10-CM | POA: Diagnosis not present

## 2022-09-04 LAB — PSA: Prostate Specific Ag, Serum: 0.9 ng/mL (ref 0.0–4.0)

## 2022-09-06 ENCOUNTER — Other Ambulatory Visit: Payer: Self-pay | Admitting: Family Medicine

## 2022-09-09 NOTE — Progress Notes (Signed)
History of Present Illness: Here for follow-up of prostate cancer-treated.  9.14.2022: TRUS/Bx. PSA 9.5, prostate volume 33.6 mL, PSAD 0.28.   1/12 cores (RT base lateral) revealed GG 2 pattern in 40 % of core.  He comes with his friend, Lubertha Basque.  He denies significant lower urinary tract symptoms.  Kattan nomogram predictions  Extracapsular disease-59% Seminal vesicle/lymph node involvement-3% each 5/10-year progression free survival with radical prostatectomy-81/69%  4.3.2023: He underwent I-125 brachytherapy and placement of SpaceOAR.  9.2.2024: Recent PSA 0.9.  Past Medical History:  Diagnosis Date   Asthma    CAP (community acquired pneumonia) 12/10/2018   COPD (chronic obstructive pulmonary disease) (HCC)    COPD with acute exacerbation (HCC) 04/18/2018   CVA (cerebral vascular accident) (HCC) 2008   with temporary vision loss    Depression    ECHOCARDIOGRAM, ABNORMAL 03/06/2010   Qualifier: Diagnosis of  By: Lyman Bishop, NP, Kathryn     Elevated PSA 2014   no diagnosis pf prostate cancer in 07/2014   History of substance abuse (HCC) 01/14/2011   Marijuana   History of substance abuse (HCC) 01/14/2011   History of tobacco abuse 01/14/2011   Hyperlipidemia    Hypertension    Lobar pneumonia (HCC) 12/13/2018   Marijuana abuse    Nicotine addiction    Prediabetes 2014    Past Surgical History:  Procedure Laterality Date   COLONOSCOPY     RADIOACTIVE SEED IMPLANT N/A 04/09/2021   Procedure: RADIOACTIVE SEED IMPLANT/BRACHYTHERAPY IMPLANT/64 SEEDS;  Surgeon: Marcine Matar, MD;  Location: [facility];  Service: Urology;  Laterality: N/A;  90 MINS   SPACE OAR INSTILLATION N/A 04/09/2021   Procedure: SPACE OAR INSTILLATION;  Surgeon: Marcine Matar, MD;  Location: [facility];  Service: Urology;  Laterality: N/A;    Home Medications:  Allergies as of 09/10/2022   No Known Allergies      Medication List        Accurate as of September 09, 2022  3:56 PM. If you have any  questions, ask your nurse or doctor.          albuterol 108 (90 Base) MCG/ACT inhaler Commonly known as: VENTOLIN HFA INHALE 2 PUFFS INTO THE LUNGS EVERY 6 HOURS AS NEEDEDFOR WHEEZING OR SHORTNESS OF BREATH.   albuterol (2.5 MG/3ML) 0.083% nebulizer solution Commonly known as: PROVENTIL Take 3 mLs (2.5 mg total) by nebulization every 6 (six) hours as needed for wheezing or shortness of breath.   amLODipine 10 MG tablet Commonly known as: NORVASC Take 1 tablet (10 mg total) by mouth daily.   aspirin EC 81 MG tablet Take 81 mg by mouth daily. Swallow whole.   cefUROXime 500 MG tablet Commonly known as: CEFTIN Take 1 tablet (500 mg total) by mouth 2 (two) times daily with a meal.   metFORMIN 500 MG tablet Commonly known as: GLUCOPHAGE TAKE 1 TABLET BY MOUTH TWICE DAILY WITH MEALS.   montelukast 10 MG tablet Commonly known as: SINGULAIR Take 1 tablet (10 mg total) by mouth at bedtime.   predniSONE 5 MG tablet Commonly known as: DELTASONE TAKE 1 TABLET EVERY DAY WITH BREAKFAST   rosuvastatin 20 MG tablet Commonly known as: CRESTOR TAKE ONE TABLET BY MOUTH ONCE DAILY.   Trelegy Ellipta 100-62.5-25 MCG/ACT Aepb Generic drug: Fluticasone-Umeclidin-Vilant Inhale 1 puff into the lungs daily at 6 (six) AM.   triamterene-hydrochlorothiazide 37.5-25 MG tablet Commonly known as: MAXZIDE-25 TAKE 1 AND 1/2 TABLETS EVERY DAY.        Allergies: No Known Allergies  No family history on file.  Social History:  reports that he quit smoking about 7 years ago. His smoking use included cigarettes. He started smoking about 47 years ago. He has a 20 pack-year smoking history. He has been exposed to tobacco smoke. He has never used smokeless tobacco. He reports that he does not currently use alcohol after a past usage of about 3.0 standard drinks of alcohol per week. He reports that he does not use drugs.  ROS: A complete review of systems was performed.  All systems are negative  except for pertinent findings as noted.  Physical Exam:  Vital signs in last 24 hours: There were no vitals taken for this visit. Constitutional:  Alert and oriented, No acute distress Cardiovascular: Regular rate  Respiratory: Normal respiratory effort GI: Abdomen is soft, nontender, nondistended, no abdominal masses. No CVAT.  Genitourinary: Normal male phallus, testes are descended bilaterally and non-tender and without masses, scrotum is normal in appearance without lesions or masses, perineum is normal on inspection. Lymphatic: No lymphadenopathy Neurologic: Grossly intact, no focal deficits Psychiatric: Normal mood and affect  I have reviewed prior pt notes  I have reviewed notes from referring/previous physicians  I have reviewed urinalysis results  I have independently reviewed prior imaging  I have reviewed prior PSA results  I have reviewed prior urine culture   Impression/Assessment:  ***  Plan:  ***

## 2022-09-10 ENCOUNTER — Encounter: Payer: Self-pay | Admitting: Urology

## 2022-09-10 ENCOUNTER — Telehealth: Payer: Self-pay | Admitting: Pulmonary Disease

## 2022-09-10 ENCOUNTER — Ambulatory Visit: Payer: Medicare HMO | Admitting: Urology

## 2022-09-10 VITALS — BP 131/66 | HR 107

## 2022-09-10 DIAGNOSIS — C61 Malignant neoplasm of prostate: Secondary | ICD-10-CM

## 2022-09-10 DIAGNOSIS — Z8546 Personal history of malignant neoplasm of prostate: Secondary | ICD-10-CM

## 2022-09-10 LAB — URINALYSIS, ROUTINE W REFLEX MICROSCOPIC
Bilirubin, UA: NEGATIVE
Glucose, UA: NEGATIVE
Leukocytes,UA: NEGATIVE
Nitrite, UA: NEGATIVE
Protein,UA: NEGATIVE
RBC, UA: NEGATIVE
Specific Gravity, UA: 1.025 (ref 1.005–1.030)
Urobilinogen, Ur: 0.2 mg/dL (ref 0.2–1.0)
pH, UA: 5.5 (ref 5.0–7.5)

## 2022-09-10 NOTE — Telephone Encounter (Signed)
Attempted to contact patient. No answer. Voicemail left requesting return call.   Patient has OV scheduled 10/07/22, will need to reassess treatment plan at that time. If patient having increased shob, decreased O2 sats or other concerning symptoms can try to schedule him with another provider sooner.

## 2022-09-10 NOTE — Telephone Encounter (Signed)
Patient states that the new medication is not helping (trilogy). It is still making him breath hard.Please call and advise [phone]

## 2022-09-11 ENCOUNTER — Other Ambulatory Visit: Payer: Self-pay | Admitting: Pulmonary Disease

## 2022-09-17 NOTE — Telephone Encounter (Signed)
Called and lvm for patient to call us back. 

## 2022-10-07 ENCOUNTER — Encounter: Payer: Self-pay | Admitting: Pulmonary Disease

## 2022-10-07 ENCOUNTER — Ambulatory Visit: Payer: Medicare HMO | Admitting: Pulmonary Disease

## 2022-10-07 VITALS — BP 115/61 | HR 98 | Wt 120.0 lb

## 2022-10-07 DIAGNOSIS — J439 Emphysema, unspecified: Secondary | ICD-10-CM

## 2022-10-07 DIAGNOSIS — J9611 Chronic respiratory failure with hypoxia: Secondary | ICD-10-CM

## 2022-10-07 DIAGNOSIS — J4489 Other specified chronic obstructive pulmonary disease: Secondary | ICD-10-CM | POA: Diagnosis not present

## 2022-10-07 DIAGNOSIS — J449 Chronic obstructive pulmonary disease, unspecified: Secondary | ICD-10-CM | POA: Diagnosis not present

## 2022-10-07 DIAGNOSIS — J471 Bronchiectasis with (acute) exacerbation: Secondary | ICD-10-CM | POA: Diagnosis not present

## 2022-10-07 DIAGNOSIS — Z7952 Long term (current) use of systemic steroids: Secondary | ICD-10-CM | POA: Diagnosis not present

## 2022-10-07 DIAGNOSIS — J441 Chronic obstructive pulmonary disease with (acute) exacerbation: Secondary | ICD-10-CM

## 2022-10-07 MED ORDER — CEFUROXIME AXETIL 500 MG PO TABS: 500.0000 mg | ORAL_TABLET | Freq: Two times a day (BID) | ORAL | 0 refills | Status: DC

## 2022-10-07 MED ORDER — TRELEGY ELLIPTA 100-62.5-25 MCG/ACT IN AEPB: 1.0000 | INHALATION_SPRAY | Freq: Every day | RESPIRATORY_TRACT | 2 refills | Status: DC

## 2022-10-07 NOTE — Progress Notes (Signed)
[facility]  Chief Complaint  Patient presents with   Follow-up    Having coughing with SOB     Constitutional:  BP 115/61   Pulse 98   Wt 120 lb (54.4 kg)   SpO2 92%   BMI 18.79 kg/m   Past Medical History:  Pneumonia, CVA, Depression, HLD, HTN, Prostate cancer  Past Surgical History:  He  has a past surgical history that includes Colonoscopy; Radioactive seed implant (N/A, 04/09/2021); and SPACE OAR INSTILLATION (N/A, 04/09/2021).  Brief Summary:  [name] is a 72 y.o. male former cigarette and marijuana smoker with COPD with emphysema and bronchiectasis, and chronic hypoxic/hypercapnic respiratory failure.       Subjective:   He has more cough with chest congestion.  Going on for past few days.  Has been coughing up green phlegm.  Sinuses feel congested.  Not having wheeze.  Has felt feverish.  No vomiting, diarrhea, or leg swelling.  Physical Exam:   Appearance - well kempt   ENMT - no sinus tenderness, no oral exudate, no LAN, Mallampati 2 airway, no stridor  Respiratory - decreased breath sounds bilaterally, no wheezing or rales  CV - s1s2 regular rate and rhythm, no murmurs  Ext - no clubbing, no edema  Skin - no rashes  Psych - normal mood and affect      Pulmonary testing:  ABG on 28% FiO2 12/11/18 >> pH 7.385, PCO2 50.2, PO2 102 PFT 07/29/19 >> FEV1 0.63 (24%), FEV1% 32, TLC 9.24 (143%), DLCO 63%  Chest Imaging:  CT chest 07/05/19 >> mild centrilobular and paraseptal emphysema, BTX lower lobes, bulla RLL LDCT chest 01/23/22 >> advanced emphysema, bronchial wall thickening, apical scarring, 6 mm nodule Rt minor fissure  Social History:  He  reports that he quit smoking about 7 years ago. His smoking use included cigarettes. He started smoking about 47 years ago. He has a 20 pack-year smoking history. He has been exposed to tobacco smoke. He has never used smokeless tobacco. He reports that he does not  currently use alcohol after a past usage of about 3.0 standard drinks of alcohol per week. He reports that he does not use drugs.  Family History:  His family history is not on file.     Assessment/Plan:   COPD/bronchiectasis exacerbation. - will give him a course of cefuroxime  Severe COPD with emphysema. - previously seen by Dr. Juanetta Gosling and Dr. Sherene Sires - has been prednisone dependent since July 2021 - don't think he needs an increase in prednisone at this time - continue trelegy 100 one puff daily, prednisone 2.5 mg daily, singulair 10 mg nightly - prn albuterol - he has a nebulizer  Bronchiectasis. - likely from prior episodes of pneumonia - prn mucinex, flutter valve  Chronic hypoxic/hypercapnic respiratory failure. - 3 liters with exertion and sleep - uses [employer] for his DME - discussed importance of maintaining compliance with supplemental oxygen  Lung nodule. - he will need follow up low dose CT chest in January 2025  Social determinants of health. - he has limited reading ability which impacts his ability to follow through with medical instructions  Time Spent Involved in Patient Care on Day of Examination:  36 minutes  Follow up:   Patient Instructions  Cefuroxime antibiotic twice per day until prescription finished.  Check at your pharmacy about getting Ayr nasal spray to help with your sinus congestion.  Follow up in 4 months.  Medication List:  Allergies as of 10/07/2022   No Known Allergies      Medication List        Accurate as of October 07, 2022  9:16 AM. If you have any questions, ask your nurse or doctor.          albuterol 108 (90 Base) MCG/ACT inhaler Commonly known as: VENTOLIN HFA INHALE 2 PUFFS INTO THE LUNGS EVERY 6 HOURS AS NEEDEDFOR WHEEZING OR SHORTNESS OF BREATH.   albuterol (2.5 MG/3ML) 0.083% nebulizer solution Commonly known as: PROVENTIL Take 3 mLs (2.5 mg total) by nebulization every 6 (six) hours as needed for  wheezing or shortness of breath.   amLODipine 10 MG tablet Commonly known as: NORVASC Take 1 tablet (10 mg total) by mouth daily.   aspirin EC 81 MG tablet Take 81 mg by mouth daily. Swallow whole.   cefUROXime 500 MG tablet Commonly known as: CEFTIN Take 1 tablet (500 mg total) by mouth 2 (two) times daily with a meal.   metFORMIN 500 MG tablet Commonly known as: GLUCOPHAGE TAKE 1 TABLET BY MOUTH TWICE DAILY WITH MEALS.   montelukast 10 MG tablet Commonly known as: SINGULAIR Take 1 tablet (10 mg total) by mouth at bedtime.   predniSONE 5 MG tablet Commonly known as: DELTASONE TAKE 1 TABLET EVERY DAY WITH BREAKFAST   rosuvastatin 20 MG tablet Commonly known as: CRESTOR TAKE ONE TABLET BY MOUTH ONCE DAILY.   Trelegy Ellipta 100-62.5-25 MCG/ACT Aepb Generic drug: Fluticasone-Umeclidin-Vilant Inhale 1 puff into the lungs daily at 2 am. What changed: See the new instructions. Changed by: Coralyn Helling   triamterene-hydrochlorothiazide 37.5-25 MG tablet Commonly known as: MAXZIDE-25 TAKE 1 AND 1/2 TABLETS EVERY DAY.        Signature:  Coralyn Helling, MD [facility] Pulmonary/Critical Care Pager - [phone] 10/07/2022, 9:16 AM

## 2022-10-07 NOTE — Patient Instructions (Signed)
Cefuroxime antibiotic twice per day until prescription finished.  Check at your pharmacy about getting Ayr nasal spray to help with your sinus congestion.  Follow up in 4 months.

## 2022-10-11 ENCOUNTER — Other Ambulatory Visit: Payer: Self-pay | Admitting: Family Medicine

## 2022-10-15 ENCOUNTER — Encounter: Payer: Self-pay | Admitting: Urology

## 2022-11-05 ENCOUNTER — Telehealth: Payer: Self-pay | Admitting: Family Medicine

## 2022-11-05 ENCOUNTER — Other Ambulatory Visit: Payer: Self-pay

## 2022-11-05 MED ORDER — MONTELUKAST SODIUM 10 MG PO TABS: 10.0000 mg | ORAL_TABLET | Freq: Every day | ORAL | 3 refills | Status: DC

## 2022-11-08 ENCOUNTER — Telehealth: Payer: Self-pay | Admitting: Pulmonary Disease

## 2022-11-08 NOTE — Telephone Encounter (Signed)
Fluticasone-Umeclidin-Vilant (TRELEGY ELLIPTA) 100-62.5-25 MCG/ACT AEPB  Pharmacy: [facility] - [place], [place] - [address] [place]

## 2022-11-09 NOTE — Telephone Encounter (Signed)
Patient has mainly only seen Dr. Craige Cotta for appts but prior to seeing Dr. Craige Cotta, patient was a patient of Dr. Thurston Hole. Routing this to Dr. Sherene Sires to see if we can refill med under him for pt.

## 2022-11-11 NOTE — Telephone Encounter (Signed)
Lm x1 for patient. Patient will need appt.

## 2022-11-11 NOTE — Telephone Encounter (Signed)
Ok x 3 m supply but by the time meds runs out should be seen / establish with one of the NPs

## 2022-11-12 ENCOUNTER — Other Ambulatory Visit: Payer: Self-pay | Admitting: Family Medicine

## 2022-11-13 ENCOUNTER — Telehealth: Payer: Self-pay | Admitting: Family Medicine

## 2022-11-13 ENCOUNTER — Other Ambulatory Visit: Payer: Self-pay

## 2022-11-13 MED ORDER — TRIAMTERENE-HCTZ 37.5-25 MG PO TABS: ORAL_TABLET | ORAL | 0 refills | Status: DC

## 2022-11-13 NOTE — Telephone Encounter (Signed)
Prescription Request  11/13/2022  LOV: 08/23/2022  What is the name of the medication or equipment? triamterene-hydrochlorothiazide (MAXZIDE-25) 37.5-25 MG tablet [[id number]]   Have you contacted your pharmacy to request a refill? No   Which pharmacy would you like this sent to?  [facility]   Patient notified that their request is being sent to the clinical staff for review and that they should receive a response within 2 business days.   Please advise at  walked into our office

## 2022-11-13 NOTE — Telephone Encounter (Signed)
Refill sent.

## 2022-11-14 ENCOUNTER — Other Ambulatory Visit: Payer: Self-pay

## 2022-11-14 MED ORDER — TRIAMTERENE-HCTZ 37.5-25 MG PO TABS: ORAL_TABLET | ORAL | 0 refills | Status: DC

## 2022-11-20 MED ORDER — TRELEGY ELLIPTA 100-62.5-25 MCG/ACT IN AEPB: 1.0000 | INHALATION_SPRAY | Freq: Every day | RESPIRATORY_TRACT | 2 refills | Status: DC

## 2022-11-20 NOTE — Telephone Encounter (Signed)
Called and spoke with pt, trelegy has been sent to pharmacy and OV has been made for January. Nfn closing encounter

## 2022-11-22 ENCOUNTER — Telehealth: Payer: Self-pay | Admitting: Family Medicine

## 2022-11-22 ENCOUNTER — Other Ambulatory Visit: Payer: Self-pay | Admitting: Family Medicine

## 2022-11-22 ENCOUNTER — Other Ambulatory Visit: Payer: Self-pay

## 2022-11-22 DIAGNOSIS — E119 Type 2 diabetes mellitus without complications: Secondary | ICD-10-CM

## 2022-11-22 MED ORDER — METFORMIN HCL 500 MG PO TABS: 500.0000 mg | ORAL_TABLET | Freq: Two times a day (BID) | ORAL | 0 refills | Status: DC

## 2022-11-22 NOTE — Telephone Encounter (Signed)
Medication sent.

## 2022-11-22 NOTE — Telephone Encounter (Signed)
Prescription Request  11/22/2022  LOV: 08/23/2022  What is the name of the medication or equipment? metFORMIN (GLUCOPHAGE) 500 MG tablet [[id number]]  Has been to pharm no meds   Have you contacted your pharmacy to request a refill? Yes   Which pharmacy would you like this sent to?  [facility] - [place], [place] - [address] [place] [address] Phone: [phone] Fax: [phone]    Patient notified that their request is being sent to the clinical staff for review and that they should receive a response within 2 business days.   Please advise at [facility] [phone]

## 2022-11-26 ENCOUNTER — Telehealth: Payer: Self-pay | Admitting: Pulmonary Disease

## 2022-11-26 NOTE — Telephone Encounter (Signed)
[name] states patient needs refill for Albuterol inhaler. Pharmacy is [facility]. [name] phone number is [phone].

## 2022-11-27 NOTE — Telephone Encounter (Signed)
[name] is calling again. Patient needs refill of Albuterol inhaler.

## 2022-11-28 MED ORDER — ALBUTEROL SULFATE HFA 108 (90 BASE) MCG/ACT IN AERS: 1.0000 | INHALATION_SPRAY | Freq: Four times a day (QID) | RESPIRATORY_TRACT | 5 refills | Status: DC | PRN

## 2022-11-28 NOTE — Telephone Encounter (Signed)
[name] is calling back in reference to the patient's albuterol inhaler. Call back number [phone]

## 2022-11-28 NOTE — Telephone Encounter (Signed)
Spoke with patient regarding prior message . Advised patient I sent in his refill for Albuterol to [facility].  Patient's voice was understanding.Nothing else further needed.

## 2023-01-09 ENCOUNTER — Other Ambulatory Visit: Payer: Self-pay

## 2023-01-09 DIAGNOSIS — Z87891 Personal history of nicotine dependence: Secondary | ICD-10-CM

## 2023-01-09 DIAGNOSIS — Z122 Encounter for screening for malignant neoplasm of respiratory organs: Secondary | ICD-10-CM

## 2023-01-21 ENCOUNTER — Ambulatory Visit: Payer: Medicare HMO | Admitting: Primary Care

## 2023-01-21 NOTE — Progress Notes (Deleted)
 @Patient  ID: [name], male    DOB: 11-16-1950, 73 y.o.   [name]: [id number]  No chief complaint on file.   Referring provider: Antonetta Rollene BRAVO, MD  HPI:   No Known Allergies  Immunization History  Administered Date(s) Administered   Fluad Quad(high Dose 65+) 08/31/2018, 10/15/2019, 09/12/2020, 10/25/2021   Influenza Split 10/23/2011   Influenza Whole 10/02/2010   Influenza,inj,Quad PF,6+ Mos 11/11/2012, 11/10/2013, 10/11/2014, 08/30/2015, 09/19/2016, 08/29/2017   Moderna Covid-19 Vaccine Bivalent Booster 58yrs & up 11/17/2020   Moderna SARS-COV2 Booster Vaccination 10/01/2019   Moderna Sars-Covid-2 Vaccination 03/03/2019, 03/31/2019, 12/14/2019, 07/07/2020   Pfizer(Comirnaty)Fall Seasonal Vaccine 12 years and older 03/07/2022   Pneumococcal Conjugate-13 03/28/2014   Pneumococcal Polysaccharide-23 05/30/2010, 06/19/2015   Td 09/22/2003   Tdap 02/27/2022   Zoster Recombinant(Shingrix) 11/20/2018, 05/11/2019    Past Medical History:  Diagnosis Date   Asthma    CAP (community acquired pneumonia) 12/10/2018   COPD (chronic obstructive pulmonary disease) (HCC)    COPD with acute exacerbation (HCC) 04/18/2018   CVA (cerebral vascular accident) (HCC) 2008   with temporary vision loss    Depression    ECHOCARDIOGRAM, ABNORMAL 03/06/2010   Qualifier: Diagnosis of  By: Jerilynn, NP, Kathryn     Elevated PSA 2014   no diagnosis pf prostate cancer in 07/2014   History of substance abuse (HCC) 01/14/2011   Marijuana   History of substance abuse (HCC) 01/14/2011   History of tobacco abuse 01/14/2011   Hyperlipidemia    Hypertension    Lobar pneumonia (HCC) 12/13/2018   Marijuana abuse    Nicotine addiction    Prediabetes 2014    Tobacco History: Social History   Tobacco Use  Smoking Status Former   Current packs/day: 0.00   Average packs/day: 0.5 packs/day for 40.0 years (20.0 ttl pk-yrs)   Types: Cigarettes   Start date: 04/14/1975   Quit date: 04/14/2015   Years since  quitting: 7.7   Passive exposure: Past  Smokeless Tobacco Never   Counseling given: Not Answered   Outpatient Medications Prior to Visit  Medication Sig Dispense Refill   albuterol  (PROVENTIL ) (2.5 MG/3ML) 0.083% nebulizer solution Take 3 mLs (2.5 mg total) by nebulization every 6 (six) hours as needed for wheezing or shortness of breath. 180 mL 3   albuterol  (VENTOLIN  HFA) 108 (90 Base) MCG/ACT inhaler Inhale 1-2 puffs into the lungs every 6 (six) hours as needed for wheezing or shortness of breath. 8.5 g 5   amLODipine  (NORVASC ) 10 MG tablet Take 1 tablet (10 mg total) by mouth daily. 90 tablet 3   aspirin  EC 81 MG tablet Take 81 mg by mouth daily. Swallow whole.     cefUROXime  (CEFTIN ) 500 MG tablet Take 1 tablet (500 mg total) by mouth 2 (two) times daily with a meal. 20 tablet 0   Fluticasone -Umeclidin-Vilant (TRELEGY ELLIPTA ) 100-62.5-25 MCG/ACT AEPB Inhale 1 puff into the lungs daily at 2 am. 180 each 2   metFORMIN  (GLUCOPHAGE ) 500 MG tablet Take 1 tablet (500 mg total) by mouth 2 (two) times daily with a meal. 180 tablet 0   montelukast  (SINGULAIR ) 10 MG tablet Take 1 tablet (10 mg total) by mouth at bedtime. 90 tablet 3   predniSONE  (DELTASONE ) 5 MG tablet TAKE 1 TABLET EVERY DAY WITH BREAKFAST 90 tablet 3   rosuvastatin  (CRESTOR ) 20 MG tablet Take 1 tablet (20 mg total) by mouth daily. 100 tablet 3   triamterene -hydrochlorothiazide  (MAXZIDE -25) 37.5-25 MG tablet TAKE 1 AND 1/2 TABLETS EVERY DAY.  135 tablet 0   No facility-administered medications prior to visit.      Review of Systems  Review of Systems   Physical Exam  There were no vitals taken for this visit. Physical Exam   Lab Results:  CBC    Component Value Date/Time   WBC 5.0 02/19/2022 0902   WBC 7.0 05/18/2021 1348   RBC 4.30 02/19/2022 0902   RBC 4.15 (L) 05/18/2021 1348   HGB 13.0 02/19/2022 0902   HCT 39.7 02/19/2022 0902   PLT 196 02/19/2022 0902   MCV 92 02/19/2022 0902   MCH 30.2 02/19/2022  0902   MCH 30.1 05/18/2021 1348   MCHC 32.7 02/19/2022 0902   MCHC 31.5 05/18/2021 1348   RDW 11.6 02/19/2022 0902   LYMPHSABS 0.7 05/18/2021 1348   LYMPHSABS 2.7 08/14/2020 0842   MONOABS 0.4 05/18/2021 1348   EOSABS 0.0 05/18/2021 1348   EOSABS 0.1 08/14/2020 0842   BASOSABS 0.0 05/18/2021 1348   BASOSABS 0.0 08/14/2020 0842    BMET    Component Value Date/Time   NA 142 08/26/2022 0929   K 3.6 08/26/2022 0929   CL 99 08/26/2022 0929   CO2 30 (H) 08/26/2022 0929   GLUCOSE 122 (H) 08/26/2022 0929   GLUCOSE 113 (H) 05/18/2021 1348   BUN 9 08/26/2022 0929   CREATININE 0.90 08/26/2022 0929   CREATININE 1.00 12/18/2018 0950   CALCIUM  9.7 08/26/2022 0929   GFRNONAA >60 05/18/2021 1348   GFRNONAA 76 12/18/2018 0950   GFRAA >60 07/29/2019 1836   GFRAA 88 12/18/2018 0950    BNP    Component Value Date/Time   BNP 33.0 11/25/2019 1527    ProBNP    Component Value Date/Time   PROBNP <5.0 02/14/2012 0957    Imaging: No results found.   Assessment & Plan:   No problem-specific Assessment & Plan notes found for this encounter.     Almarie LELON Ferrari, NP 01/21/2023

## 2023-02-07 ENCOUNTER — Other Ambulatory Visit: Payer: Self-pay

## 2023-02-07 ENCOUNTER — Telehealth: Payer: Self-pay | Admitting: Family Medicine

## 2023-02-07 MED ORDER — ALBUTEROL SULFATE (2.5 MG/3ML) 0.083% IN NEBU: 2.5000 mg | INHALATION_SOLUTION | Freq: Four times a day (QID) | RESPIRATORY_TRACT | 3 refills | Status: DC | PRN

## 2023-02-07 NOTE — Telephone Encounter (Signed)
 Refills sent

## 2023-02-07 NOTE — Telephone Encounter (Signed)
Prescription Request  02/07/2023  LOV: 08/23/2022  What is the name of the medication or equipment? albuterol (PROVENTIL) (2.5 MG/3ML) 0.083% nebulizer solution [[id number]]    Have you contacted your pharmacy to request a refill? Yes   Which pharmacy would you like this sent to?   [facility] [place] - [place], [place] - [address] [place] [address] Phone: [phone] Fax: [phone]      Patient notified that their request is being sent to the clinical staff for review and that they should receive a response within 2 business days.   Please advise at [facility] [phone]

## 2023-02-25 ENCOUNTER — Ambulatory Visit: Payer: Medicare HMO | Admitting: Family Medicine

## 2023-02-26 ENCOUNTER — Other Ambulatory Visit: Payer: Self-pay

## 2023-02-26 MED ORDER — AMLODIPINE BESYLATE 10 MG PO TABS: 10.0000 mg | ORAL_TABLET | Freq: Every day | ORAL | 3 refills | Status: AC

## 2023-02-27 ENCOUNTER — Ambulatory Visit (HOSPITAL_COMMUNITY)
Admission: RE | Admit: 2023-02-27 | Discharge: 2023-02-27 | Disposition: A | Discharge status: Home / Self Care | Payer: Medicare HMO | Source: Ambulatory Visit | Attending: Physician Assistant | Admitting: Physician Assistant

## 2023-02-27 DIAGNOSIS — Z87891 Personal history of nicotine dependence: Secondary | ICD-10-CM | POA: Insufficient documentation

## 2023-02-27 DIAGNOSIS — Z122 Encounter for screening for malignant neoplasm of respiratory organs: Secondary | ICD-10-CM | POA: Diagnosis not present

## 2023-03-13 ENCOUNTER — Encounter: Payer: Self-pay | Admitting: Family Medicine

## 2023-03-13 ENCOUNTER — Ambulatory Visit: Admitting: Family Medicine

## 2023-03-13 ENCOUNTER — Ambulatory Visit (HOSPITAL_COMMUNITY)
Admission: RE | Admit: 2023-03-13 | Discharge: 2023-03-13 | Disposition: A | Discharge status: Home / Self Care | Source: Ambulatory Visit | Attending: Family Medicine | Admitting: Family Medicine

## 2023-03-13 VITALS — BP 108/75 | HR 111 | Temp 98.5°F | Ht 67.0 in | Wt 108.8 lb

## 2023-03-13 DIAGNOSIS — R918 Other nonspecific abnormal finding of lung field: Secondary | ICD-10-CM | POA: Diagnosis not present

## 2023-03-13 DIAGNOSIS — J069 Acute upper respiratory infection, unspecified: Secondary | ICD-10-CM | POA: Diagnosis not present

## 2023-03-13 DIAGNOSIS — R0602 Shortness of breath: Secondary | ICD-10-CM

## 2023-03-13 DIAGNOSIS — J449 Chronic obstructive pulmonary disease, unspecified: Secondary | ICD-10-CM | POA: Diagnosis not present

## 2023-03-13 MED ORDER — LEVOCETIRIZINE DIHYDROCHLORIDE 5 MG PO TABS: 5.0000 mg | ORAL_TABLET | Freq: Every evening | ORAL | 2 refills | Status: DC

## 2023-03-13 MED ORDER — AZITHROMYCIN 250 MG PO TABS: ORAL_TABLET | ORAL | 0 refills | Status: DC

## 2023-03-13 NOTE — Patient Instructions (Addendum)

## 2023-03-13 NOTE — Progress Notes (Signed)
 Established Patient Office Visit   Subjective  Patient ID: [name], male    DOB: 1950-12-10  Age: 73 y.o. [name]: [id number]  Chief Complaint  Patient presents with   Breathing Problem    Can't breath left o2 at only only has big tank o2 machine at home not working just started today. Sinus issues on going     He  has a past medical history of Asthma, CAP (community acquired pneumonia) (12/10/2018), COPD (chronic obstructive pulmonary disease) (HCC), COPD with acute exacerbation (HCC) (04/18/2018), CVA (cerebral vascular accident) (HCC) (2008), Depression, ECHOCARDIOGRAM, ABNORMAL (03/06/2010), Elevated PSA (2014), History of substance abuse (HCC) (01/14/2011), History of substance abuse (HCC) (01/14/2011), History of tobacco abuse (01/14/2011), Hyperlipidemia, Hypertension, Lobar pneumonia (HCC) (12/13/2018), Marijuana abuse, Nicotine addiction, and Prediabetes (2014).  Patient presents with chest tightness, difficulty breathing, and frequent throat clearing that began this morning. The symptoms occur constantly and have been gradually worsening. Associated symptoms include dyspnea on exertion, ear congestion, ear pain, fever, fatigue, and nasal congestion. The patient denies chest pain or difficulty swallowing. Symptoms are aggravated by any physical activity and partially relieved by oral steroids, rest, and OTC cough suppressant. He reports moderate improvement with current treatment. Medical history is significant for asthma and COPD. Current inhaler use includes Trelegy Ellipta (Fluticasone-Umeclidinium-Vilanterol). Patient is on 3L nasal cannula with SpO2 96% on oxygen.    Review of Systems  Constitutional:  Positive for fever and malaise/fatigue.  HENT:  Positive for congestion and sore throat.   Respiratory:  Positive for cough, sputum production, shortness of breath and wheezing. Negative for hemoptysis.   Cardiovascular:  Negative for chest pain.  Gastrointestinal:  Negative for blood in  stool, nausea and vomiting.  Genitourinary:  Negative for dysuria.  Neurological:  Positive for headaches.      Objective:     BP 108/75   Pulse (!) 111   Temp 98.5 F (36.9 C) (Temporal)   Ht 5\' 7"  (1.702 m)   Wt 108 lb 12.8 oz (49.4 kg)   SpO2 (!) 83%   BMI 17.04 kg/m  BP Readings from Last 3 Encounters:  03/13/23 108/75  10/07/22 115/61  09/10/22 131/66      Physical Exam Vitals reviewed.  Constitutional:      General: He is not in acute distress.    Appearance: Normal appearance. He is not ill-appearing, toxic-appearing or diaphoretic.  HENT:     Head: Normocephalic.     Nose: Congestion and rhinorrhea present.     Mouth/Throat:     Pharynx: Posterior oropharyngeal erythema present.  Eyes:     General:        Right eye: No discharge.        Left eye: No discharge.     Conjunctiva/sclera: Conjunctivae normal.  Cardiovascular:     Rate and Rhythm: Normal rate.     Pulses: Normal pulses.     Heart sounds: Normal heart sounds.  Pulmonary:     Effort: Pulmonary effort is normal. No respiratory distress.     Breath sounds: Wheezing and rhonchi present.  Abdominal:     General: Bowel sounds are normal.     Palpations: Abdomen is soft.     Tenderness: There is no abdominal tenderness. There is no right CVA tenderness, left CVA tenderness or guarding.  Skin:    General: Skin is warm and dry.     Capillary Refill: Capillary refill takes less than 2 seconds.  Neurological:     General: No  focal deficit present.     Mental Status: He is alert and oriented to person, place, and time.     Coordination: Coordination normal.     Gait: Gait normal.  Psychiatric:        Mood and Affect: Mood normal.        Behavior: Behavior normal.      No results found for any visits on 03/13/23.  The ASCVD Risk score (Arnett DK, et al., 2019) failed to calculate for the following reasons:   Risk score cannot be calculated because patient has a medical history suggesting  prior/existing ASCVD    Assessment & Plan:  SOB (shortness of breath) -     DG Chest 2 View; Future -     COVID-19, Flu A+B and RSV  Upper respiratory tract infection, unspecified type -     Azithromycin; Take 2 tablets on day 1, then 1 tablet daily on days 2 through 5  Dispense: 6 tablet; Refill: 0  Upper respiratory infection, acute Assessment & Plan: Chest X-ray ordered. Empirically treating with Azithromycin for suspected bacterial infection. Xyzal 5 mg at bedtime, Continue with prescribed inhalers. Advise if symptoms worsen visit ED asap Advise patient to rest to support your body's recovery. Stay hydrated by drinking water, tea, or broth. Using a humidifier can help soothe throat irritation and ease nasal congestion. For fever or pain, acetaminophen (Tylenol) is recommended. To relieve other symptoms, try saline nasal sprays, throat lozenges, or gargling with saltwater. Focus on eating light, healthy meals like fruits and vegetables to keep your strength up. Practice good hygiene by washing your hands frequently and covering your mouth when coughing or sneezing.Follow-up for worsening or persistent symptoms. Patient verbalizes understanding regarding plan of care and all questions answered  COVID-19, Flu A+B RSV test ordered        Other orders -     Levocetirizine Dihydrochloride; Take 1 tablet (5 mg total) by mouth every evening.  Dispense: 30 tablet; Refill: 2    Return if symptoms worsen or fail to improve.   Cruzita Lederer Newman Nip, FNP

## 2023-03-13 NOTE — Assessment & Plan Note (Addendum)
 Chest X-ray ordered. Empirically treating with Azithromycin for suspected bacterial infection. Xyzal 5 mg at bedtime, Continue with prescribed inhalers. Advise if symptoms worsen visit ED asap Advise patient to rest to support your body's recovery. Stay hydrated by drinking water, tea, or broth. Using a humidifier can help soothe throat irritation and ease nasal congestion. For fever or pain, acetaminophen (Tylenol) is recommended. To relieve other symptoms, try saline nasal sprays, throat lozenges, or gargling with saltwater. Focus on eating light, healthy meals like fruits and vegetables to keep your strength up. Practice good hygiene by washing your hands frequently and covering your mouth when coughing or sneezing.Follow-up for worsening or persistent symptoms. Patient verbalizes understanding regarding plan of care and all questions answered  COVID-19, Flu A+B RSV test ordered

## 2023-03-18 ENCOUNTER — Ambulatory Visit: Payer: Self-pay | Admitting: Family Medicine

## 2023-03-18 ENCOUNTER — Telehealth: Payer: Self-pay

## 2023-03-18 NOTE — Telephone Encounter (Signed)
 Copied from CRM [id number]. Topic: Clinical - Lab/Test Results >> Mar 18, 2023  9:07 AM Payton Doughty wrote: Reason for CRM: Erskine Squibb w/ [facility] calling w/ a call report   Chief Complaint: Radiology reports  Additional Notes: Call report from Erskine Squibb at [facility]. Chest 2 view w/ 2 impressions, 1 being a new patchy RML opacity  and CT chest w/ 4 impressions, 1 being new solid RUL 5.101mm pulmonary nodule.  Will route HP to office for follow-up  Reason for Disposition  Lab or radiology calling with CRITICAL test results  Answer Assessment - Initial Assessment Questions 1. REASON FOR CALL or QUESTION: "What is your reason for calling today?" or "How can I best help you?" or "What question do you have that I can help answer?"     Calling to advise of findings from CT and Chest xray  2. CALLER: Document the source of call. (e.g., laboratory, patient).     Erskine Squibb with [facility]  Protocols used: PCP Call - No Triage-A-AH

## 2023-03-18 NOTE — Telephone Encounter (Signed)
 Attempted to reach patient regarding LDCT results. Unable to reach patient directly, VM left asking that the patient return my call.

## 2023-03-19 NOTE — Telephone Encounter (Signed)
 Appt scheduled, patient aware.

## 2023-03-19 NOTE — Telephone Encounter (Signed)
 Pls schedule appointment tomorrow with me to review this with patient and for his regular follow up which he missed, thanks

## 2023-03-20 ENCOUNTER — Ambulatory Visit (INDEPENDENT_AMBULATORY_CARE_PROVIDER_SITE_OTHER): Admitting: Family Medicine

## 2023-03-20 ENCOUNTER — Encounter: Payer: Self-pay | Admitting: Family Medicine

## 2023-03-20 VITALS — BP 118/73 | HR 99 | Resp 16 | Ht 67.0 in | Wt 105.8 lb

## 2023-03-20 DIAGNOSIS — N179 Acute kidney failure, unspecified: Secondary | ICD-10-CM

## 2023-03-20 DIAGNOSIS — E119 Type 2 diabetes mellitus without complications: Secondary | ICD-10-CM

## 2023-03-20 DIAGNOSIS — I10 Essential (primary) hypertension: Secondary | ICD-10-CM | POA: Diagnosis not present

## 2023-03-20 DIAGNOSIS — E1169 Type 2 diabetes mellitus with other specified complication: Secondary | ICD-10-CM | POA: Diagnosis not present

## 2023-03-20 DIAGNOSIS — E559 Vitamin D deficiency, unspecified: Secondary | ICD-10-CM

## 2023-03-20 DIAGNOSIS — J449 Chronic obstructive pulmonary disease, unspecified: Secondary | ICD-10-CM | POA: Diagnosis not present

## 2023-03-20 DIAGNOSIS — E118 Type 2 diabetes mellitus with unspecified complications: Secondary | ICD-10-CM

## 2023-03-20 DIAGNOSIS — E785 Hyperlipidemia, unspecified: Secondary | ICD-10-CM | POA: Diagnosis not present

## 2023-03-20 DIAGNOSIS — J189 Pneumonia, unspecified organism: Secondary | ICD-10-CM

## 2023-03-20 MED ORDER — METFORMIN HCL 500 MG PO TABS
500.0000 mg | ORAL_TABLET | Freq: Two times a day (BID) | ORAL | 1 refills | Status: AC
Start: 2023-03-20 — End: ?

## 2023-03-20 MED ORDER — CEFDINIR 300 MG PO CAPS
300.0000 mg | ORAL_CAPSULE | Freq: Two times a day (BID) | ORAL | 0 refills | Status: DC
Start: 2023-03-20 — End: 2023-05-09

## 2023-03-20 MED ORDER — TRIAMTERENE-HCTZ 37.5-25 MG PO TABS: ORAL_TABLET | ORAL | 1 refills | Status: DC

## 2023-03-20 NOTE — Progress Notes (Signed)
 Patient notified of LDCT Lung Cancer Screening Results via telephone with the recommendation to follow-up in 6 months. Patient's referring provider has been sent a copy of results. Results are as follows:  IMPRESSION: 1. Lung-RADS 3, probably benign findings. Short-term follow-up in 6 months is recommended with repeat low-dose chest CT without contrast (please use the following order, "CT CHEST LCS NODULE FOLLOW-UP W/O CM"). New solid right upper lobe 5.3 mm pulmonary nodule. 2. Chronic scattered moderate varicoid bronchiectasis, most prominent in the right greater than left lower lobes. 3. One vessel coronary atherosclerosis. 4. Aortic Atherosclerosis (ICD10-I70.0) and Emphysema (ICD10-J43.9).

## 2023-03-20 NOTE — Patient Instructions (Addendum)
 Annual exam in office first or 2nd week in May, call if you need me sooner  CXR to be done April 13 to re evaluate pneumonia, just  go to Radiology dept on that day p[ease  10 day course  of a new antibiotic, omnicef is prescribed take one twice daily for 10 days, it is at your pharmacy, I have a sent in the prescription  You ned another chest scan in August because of a new nodule and we will schedule this and let you know  Urine ACR,  CBC, cmp and EGFR, hBA1C, TSH , lipid and vit D today  Thanks for choosing [facility], we consider it a privelige to serve you.

## 2023-03-22 LAB — CBC WITH DIFFERENTIAL/PLATELET
Basophils Absolute: 0 10*3/uL (ref 0.0–0.2)
Basos: 1 %
EOS (ABSOLUTE): 0.1 10*3/uL (ref 0.0–0.4)
Eos: 2 %
Hematocrit: 37.1 % — ABNORMAL LOW (ref 37.5–51.0)
Hemoglobin: 12 g/dL — ABNORMAL LOW (ref 13.0–17.7)
Immature Grans (Abs): 0 10*3/uL (ref 0.0–0.1)
Immature Granulocytes: 0 %
Lymphocytes Absolute: 1.4 10*3/uL (ref 0.7–3.1)
Lymphs: 29 %
MCH: 28.8 pg (ref 26.6–33.0)
MCHC: 32.3 g/dL (ref 31.5–35.7)
MCV: 89 fL (ref 79–97)
Monocytes Absolute: 0.4 10*3/uL (ref 0.1–0.9)
Monocytes: 9 %
Neutrophils Absolute: 2.7 10*3/uL (ref 1.4–7.0)
Neutrophils: 59 %
Platelets: 563 10*3/uL — ABNORMAL HIGH (ref 150–450)
RBC: 4.17 x10E6/uL (ref 4.14–5.80)
RDW: 12.5 % (ref 11.6–15.4)
WBC: 4.7 10*3/uL (ref 3.4–10.8)

## 2023-03-22 LAB — MICROALBUMIN / CREATININE URINE RATIO
Creatinine, Urine: 125 mg/dL
Microalb/Creat Ratio: 20 mg/g{creat} (ref 0–29)
Microalbumin, Urine: 25.5 ug/mL

## 2023-03-22 LAB — CMP14+EGFR
ALT: 11 IU/L (ref 0–44)
AST: 16 IU/L (ref 0–40)
Albumin: 4.1 g/dL (ref 3.8–4.8)
Alkaline Phosphatase: 111 IU/L (ref 44–121)
BUN/Creatinine Ratio: 15 (ref 10–24)
BUN: 35 mg/dL — ABNORMAL HIGH (ref 8–27)
Bilirubin Total: 0.2 mg/dL (ref 0.0–1.2)
CO2: 30 mmol/L — ABNORMAL HIGH (ref 20–29)
Calcium: 10 mg/dL (ref 8.6–10.2)
Chloride: 92 mmol/L — ABNORMAL LOW (ref 96–106)
Creatinine, Ser: 2.36 mg/dL — ABNORMAL HIGH (ref 0.76–1.27)
Globulin, Total: 3 g/dL (ref 1.5–4.5)
Glucose: 135 mg/dL — ABNORMAL HIGH (ref 70–99)
Potassium: 4.3 mmol/L (ref 3.5–5.2)
Sodium: 139 mmol/L (ref 134–144)
Total Protein: 7.1 g/dL (ref 6.0–8.5)
eGFR: 28 mL/min/{1.73_m2} — ABNORMAL LOW (ref >59)

## 2023-03-22 LAB — LIPID PANEL
Chol/HDL Ratio: 3.2 ratio (ref 0.0–5.0)
Cholesterol, Total: 136 mg/dL (ref 100–199)
HDL: 42 mg/dL (ref >39)
LDL Chol Calc (NIH): 76 mg/dL (ref 0–99)
Triglycerides: 96 mg/dL (ref 0–149)
VLDL Cholesterol Cal: 18 mg/dL (ref 5–40)

## 2023-03-22 LAB — HEMOGLOBIN A1C
Est. average glucose Bld gHb Est-mCnc: 137 mg/dL
Hgb A1c MFr Bld: 6.4 % — ABNORMAL HIGH (ref 4.8–5.6)

## 2023-03-22 LAB — VITAMIN D 25 HYDROXY (VIT D DEFICIENCY, FRACTURES): Vit D, 25-Hydroxy: 19.8 ng/mL — ABNORMAL LOW (ref 30.0–100.0)

## 2023-03-22 LAB — TSH: TSH: 2.79 u[IU]/mL (ref 0.450–4.500)

## 2023-03-28 DIAGNOSIS — N179 Acute kidney failure, unspecified: Secondary | ICD-10-CM | POA: Diagnosis not present

## 2023-03-29 LAB — BMP8+EGFR
BUN/Creatinine Ratio: 12 (ref 10–24)
BUN: 26 mg/dL (ref 8–27)
CO2: 27 mmol/L (ref 20–29)
Calcium: 9.9 mg/dL (ref 8.6–10.2)
Chloride: 96 mmol/L (ref 96–106)
Creatinine, Ser: 2.17 mg/dL — ABNORMAL HIGH (ref 0.76–1.27)
Glucose: 132 mg/dL — ABNORMAL HIGH (ref 70–99)
Potassium: 4 mmol/L (ref 3.5–5.2)
Sodium: 140 mmol/L (ref 134–144)
eGFR: 31 mL/min/{1.73_m2} — ABNORMAL LOW (ref >59)

## 2023-04-01 ENCOUNTER — Other Ambulatory Visit: Payer: Medicare HMO

## 2023-04-01 DIAGNOSIS — Z8546 Personal history of malignant neoplasm of prostate: Secondary | ICD-10-CM

## 2023-04-01 DIAGNOSIS — R972 Elevated prostate specific antigen [PSA]: Secondary | ICD-10-CM | POA: Diagnosis not present

## 2023-04-02 LAB — PSA: Prostate Specific Ag, Serum: 0.4 ng/mL (ref 0.0–4.0)

## 2023-04-07 NOTE — Progress Notes (Signed)
 History of Present Illness: Here for follow-up of prostate cancer-treated.  9.14.2022: TRUS/Bx. PSA 9.5, prostate volume 33.6 mL, PSAD 0.28.   1/12 cores (RT base lateral) revealed GG 2 pattern in 40 % of core.  He comes with his friend, Lubertha Basque.  He denies significant lower urinary tract symptoms.  Kattan nomogram predictions  Extracapsular disease-59% Seminal vesicle/lymph node involvement-3% each 5/10-year progression free survival with radical prostatectomy-81/69%  4.3.2023: He underwent I-125 brachytherapy and placement of SpaceOAR.  4.1.2025: PSA 0.4.  From a urologic standpoint he is doing well.  He was recently treated for pneumonia by Dr. Lodema Hong.  He has not seen blood in his urine or stool.  He denies any real problems urinating/dysuria.  Past Medical History:  Diagnosis Date   Asthma    CAP (community acquired pneumonia) 12/10/2018   COPD (chronic obstructive pulmonary disease) (HCC)    COPD with acute exacerbation (HCC) 04/18/2018   CVA (cerebral vascular accident) (HCC) 2008   with temporary vision loss    Depression    ECHOCARDIOGRAM, ABNORMAL 03/06/2010   Qualifier: Diagnosis of  By: Lyman Bishop, NP, Kathryn     Elevated PSA 2014   no diagnosis pf prostate cancer in 07/2014   History of substance abuse (HCC) 01/14/2011   Marijuana   History of substance abuse (HCC) 01/14/2011   History of tobacco abuse 01/14/2011   Hyperlipidemia    Hypertension    Lobar pneumonia (HCC) 12/13/2018   Marijuana abuse    Nicotine addiction    Prediabetes 2014    Past Surgical History:  Procedure Laterality Date   COLONOSCOPY     RADIOACTIVE SEED IMPLANT N/A 04/09/2021   Procedure: RADIOACTIVE SEED IMPLANT/BRACHYTHERAPY IMPLANT/64 SEEDS;  Surgeon: Marcine Matar, MD;  Location: [facility];  Service: Urology;  Laterality: N/A;  90 MINS   SPACE OAR INSTILLATION N/A 04/09/2021   Procedure: SPACE OAR INSTILLATION;  Surgeon: Marcine Matar, MD;  Location: [facility];  Service: Urology;   Laterality: N/A;    Home Medications:  Allergies as of 04/08/2023   No Known Allergies      Medication List        Accurate as of April 07, 2023  6:16 AM. If you have any questions, ask your nurse or doctor.          albuterol 108 (90 Base) MCG/ACT inhaler Commonly known as: VENTOLIN HFA Inhale 1-2 puffs into the lungs every 6 (six) hours as needed for wheezing or shortness of breath.   albuterol (2.5 MG/3ML) 0.083% nebulizer solution Commonly known as: PROVENTIL Take 3 mLs (2.5 mg total) by nebulization every 6 (six) hours as needed for wheezing or shortness of breath.   amLODipine 10 MG tablet Commonly known as: NORVASC Take 1 tablet (10 mg total) by mouth daily.   aspirin EC 81 MG tablet Take 81 mg by mouth daily. Swallow whole.   cefdinir 300 MG capsule Commonly known as: OMNICEF Take 1 capsule (300 mg total) by mouth 2 (two) times daily.   levocetirizine 5 MG tablet Commonly known as: XYZAL Take 1 tablet (5 mg total) by mouth every evening.   metFORMIN 500 MG tablet Commonly known as: GLUCOPHAGE Take 1 tablet (500 mg total) by mouth 2 (two) times daily with a meal.   montelukast 10 MG tablet Commonly known as: SINGULAIR Take 1 tablet (10 mg total) by mouth at bedtime.   predniSONE 5 MG tablet Commonly known as: DELTASONE TAKE 1 TABLET EVERY DAY WITH BREAKFAST   rosuvastatin 20 MG  tablet Commonly known as: CRESTOR Take 1 tablet (20 mg total) by mouth daily.   Trelegy Ellipta 100-62.5-25 MCG/ACT Aepb Generic drug: Fluticasone-Umeclidin-Vilant Inhale 1 puff into the lungs daily at 2 am.   triamterene-hydrochlorothiazide 37.5-25 MG tablet Commonly known as: MAXZIDE-25 TAKE 1 AND 1/2 TABLETS EVERY DAY.        Allergies: No Known Allergies  No family history on file.  Social History:  reports that he quit smoking about 7 years ago. His smoking use included cigarettes. He started smoking about 48 years ago. He has a 20 pack-year smoking history.  He has been exposed to tobacco smoke. He has never used smokeless tobacco. He reports that he does not currently use alcohol after a past usage of about 3.0 standard drinks of alcohol per week. He reports that he does not use drugs.  ROS: A complete review of systems was performed.  All systems are negative except for pertinent findings as noted.  Physical Exam:  Vital signs in last 24 hours: There were no vitals taken for this visit. Constitutional:  Alert and oriented, No acute distress.  Quite thin. Cardiovascular: Regular rate  Respiratory: Normal respiratory effort Neurologic: Grossly intact, no focal deficits Psychiatric: Normal mood and affect  I have reviewed prior pt notes  I have reviewed urinalysis results  I have independently reviewed prior imaging--prior prostate U/S  I have reviewed prior PSA and pathology results      Impression/Assessment:  Grade group 2 prostate cancer, 2 years out from brachytherapy, doing quite well  Plan:  I will see him back in 6 months with PSA

## 2023-04-08 ENCOUNTER — Encounter: Payer: Self-pay | Admitting: Urology

## 2023-04-08 ENCOUNTER — Ambulatory Visit (INDEPENDENT_AMBULATORY_CARE_PROVIDER_SITE_OTHER): Payer: Medicare HMO | Admitting: Urology

## 2023-04-08 VITALS — BP 116/66 | HR 101

## 2023-04-08 DIAGNOSIS — Z08 Encounter for follow-up examination after completed treatment for malignant neoplasm: Secondary | ICD-10-CM

## 2023-04-08 DIAGNOSIS — Z8546 Personal history of malignant neoplasm of prostate: Secondary | ICD-10-CM

## 2023-04-08 LAB — URINALYSIS, ROUTINE W REFLEX MICROSCOPIC
Bilirubin, UA: NEGATIVE
Glucose, UA: NEGATIVE
Ketones, UA: NEGATIVE
Leukocytes,UA: NEGATIVE
Nitrite, UA: NEGATIVE
Specific Gravity, UA: 1.015 (ref 1.005–1.030)
Urobilinogen, Ur: 1 mg/dL (ref 0.2–1.0)
pH, UA: 6.5 (ref 5.0–7.5)

## 2023-04-08 LAB — MICROSCOPIC EXAMINATION

## 2023-04-14 ENCOUNTER — Ambulatory Visit (HOSPITAL_COMMUNITY)
Admission: RE | Admit: 2023-04-14 | Discharge: 2023-04-14 | Disposition: A | Discharge status: Home / Self Care | Source: Ambulatory Visit | Attending: Family Medicine | Admitting: Family Medicine

## 2023-04-14 DIAGNOSIS — J439 Emphysema, unspecified: Secondary | ICD-10-CM | POA: Diagnosis not present

## 2023-04-14 DIAGNOSIS — J189 Pneumonia, unspecified organism: Secondary | ICD-10-CM | POA: Diagnosis not present

## 2023-04-14 DIAGNOSIS — J9811 Atelectasis: Secondary | ICD-10-CM | POA: Diagnosis not present

## 2023-04-19 ENCOUNTER — Encounter: Payer: Self-pay | Admitting: Family Medicine

## 2023-04-19 DIAGNOSIS — N179 Acute kidney failure, unspecified: Secondary | ICD-10-CM | POA: Insufficient documentation

## 2023-04-19 DIAGNOSIS — J189 Pneumonia, unspecified organism: Secondary | ICD-10-CM | POA: Insufficient documentation

## 2023-04-19 NOTE — Progress Notes (Signed)
 ARROW TOMKO     [name]: [id number]      DOB: 02-12-1950  Chief Complaint  Patient presents with   Follow-up    HPI [name] is here for follow up and re-evaluation of chronic medical conditions, medication management and review of any available recent lab and radiology data.  Preventive health is updated, specifically  Cancer screening and Immunization.   Questions or concerns regarding consultations or procedures which the PT has had in the interim are  addressed. The PT denies any adverse reactions to current medications since the last visit.  There are no new concerns.  There are no specific complaints   ROS Denies recent fever or chills. Denies sinus pressure, nasal congestion, ear pain or sore throat. Denies chest congestion, productive cough or wheezing. Denies chest pains, palpitations and leg swelling Denies abdominal pain, nausea, vomiting,diarrhea or constipation.   Denies dysuria, frequency, hesitancy or incontinence. Denies joint pain, swelling and limitation in mobility. Denies headaches, seizures, numbness, or tingling. Denies depression, anxiety or insomnia. Denies skin break down or rash.   PE  BP 118/73   Pulse 99   Resp 16   Ht 5\' 7"  (1.702 m)   Wt 105 lb 12.8 oz (48 kg)   SpO2 (!) 86% Comment: room air  BMI 16.57 kg/m   Patient alert and oriented and in no cardiopulmonary distress.  HEENT: No facial asymmetry, EOMI,     Neck supple .  Chest: Clear to auscultation bilaterally.  CVS: S1, S2 no murmurs, no S3.Regular rate.  ABD: Soft non tender.   Ext: No edema  MS: Adequate ROM spine, shoulders, hips and knees.  Skin: Intact, no ulcerations or rash noted.  Psych: Good eye contact, normal affect. Memory intact not anxious or depressed appearing.  CNS: CN 2-12 intact, power,  normal throughout.no focal deficits noted.   Assessment & Plan  AKI (acute kidney injury) (HCC) Improve hydration with close rept lab  Controlled diabetes  mellitus type 2 with complications (HCC) Diabetes associated with hypertension and hyperlipidemia  [name] is reminded of the importance of commitment to daily physical activity for 30 minutes or more, as able and the need to limit carbohydrate intake to 30 to 60 grams per meal to help with blood sugar control.   The need to take medication as prescribed, test blood sugar as directed, and to call between visits if there is a concern that blood sugar is uncontrolled is also discussed.   [name] is reminded of the importance of daily foot exam, annual eye examination, and good blood sugar, blood pressure and cholesterol control.     Latest Ref Rng & Units 03/28/2023    9:00 AM 03/20/2023   10:20 AM 08/26/2022    9:29 AM 02/19/2022    9:02 AM 10/25/2021   10:30 AM  Diabetic Labs  HbA1c 4.8 - 5.6 %  6.4  6.7  6.4  6.5   Micro/Creat Ratio 0 - 29 mg/g creat  20    32   Chol 100 - 199 mg/dL  846  962  952  841   HDL >39 mg/dL  42  85  75  84   Calc LDL 0 - 99 mg/dL  76  59  67  324   Triglycerides 0 - 149 mg/dL  96  64  54  67   Creatinine 0.76 - 1.27 mg/dL 4.01  0.27  2.53  6.64  0.79       04/08/2023  9:35 AM 03/20/2023    9:09 AM 03/13/2023    2:25 PM 03/13/2023    2:24 PM 10/07/2022    9:03 AM 09/10/2022    9:48 AM 09/10/2022    9:30 AM  BP/Weight  Systolic BP 116 118 108 95 115 131 147  Diastolic BP 66 73 75 59 61 66 78  Wt. (Lbs)  105.8  108.8 120    BMI  16.57 kg/m2  17.04 kg/m2 18.79 kg/m2        Latest Ref Rng & Units 08/28/2022   12:00 AM 10/25/2021    9:40 AM  Foot/eye exam completion dates  Eye Exam No Retinopathy No Retinopathy       Foot Form Completion   Done     This result is from an external source.      Updated lab needed at/ before next visit.   Essential hypertension Controlled, no change in medication DASH diet and commitment to daily physical activity for a minimum of 30 minutes discussed and encouraged, as a part of hypertension management. The  importance of attaining a healthy weight is also discussed.     04/08/2023    9:35 AM 03/20/2023    9:09 AM 03/13/2023    2:25 PM 03/13/2023    2:24 PM 10/07/2022    9:03 AM 09/10/2022    9:48 AM 09/10/2022    9:30 AM  BP/Weight  Systolic BP 116 118 108 95 115 131 147  Diastolic BP 66 73 75 59 61 66 78  Wt. (Lbs)  105.8  108.8 120    BMI  16.57 kg/m2  17.04 kg/m2 18.79 kg/m2         Hyperlipidemia LDL goal <100 Hyperlipidemia:Low fat diet discussed and encouraged.   Lipid Panel  Lab Results  Component Value Date   CHOL 136 03/20/2023   HDL 42 03/20/2023   LDLCALC 76 03/20/2023   TRIG 96 03/20/2023   CHOLHDL 3.2 03/20/2023     Controlled, no change in medication   COPD GOLD IV/ group D  Cloinically stable, managed by Pulmonary

## 2023-04-19 NOTE — Assessment & Plan Note (Signed)
 Improve hydration with close rept lab

## 2023-04-19 NOTE — Assessment & Plan Note (Signed)
 Cloinically stable, managed by Pulmonary

## 2023-04-19 NOTE — Assessment & Plan Note (Signed)
 Controlled, no change in medication DASH diet and commitment to daily physical activity for a minimum of 30 minutes discussed and encouraged, as a part of hypertension management. The importance of attaining a healthy weight is also discussed.     04/08/2023    9:35 AM 03/20/2023    9:09 AM 03/13/2023    2:25 PM 03/13/2023    2:24 PM 10/07/2022    9:03 AM 09/10/2022    9:48 AM 09/10/2022    9:30 AM  BP/Weight  Systolic BP 116 118 108 95 115 131 147  Diastolic BP 66 73 75 59 61 66 78  Wt. (Lbs)  105.8  108.8 120    BMI  16.57 kg/m2  17.04 kg/m2 18.79 kg/m2

## 2023-04-19 NOTE — Assessment & Plan Note (Signed)
 Hyperlipidemia:Low fat diet discussed and encouraged.   Lipid Panel  Lab Results  Component Value Date   CHOL 136 03/20/2023   HDL 42 03/20/2023   LDLCALC 76 03/20/2023   TRIG 96 03/20/2023   CHOLHDL 3.2 03/20/2023     Controlled, no change in medication

## 2023-04-19 NOTE — Assessment & Plan Note (Signed)
 Diabetes associated with hypertension and hyperlipidemia  Mr. Currey is reminded of the importance of commitment to daily physical activity for 30 minutes or more, as able and the need to limit carbohydrate intake to 30 to 60 grams per meal to help with blood sugar control.   The need to take medication as prescribed, test blood sugar as directed, and to call between visits if there is a concern that blood sugar is uncontrolled is also discussed.   Mr. Desilets is reminded of the importance of daily foot exam, annual eye examination, and good blood sugar, blood pressure and cholesterol control.     Latest Ref Rng & Units 03/28/2023    9:00 AM 03/20/2023   10:20 AM 08/26/2022    9:29 AM 02/19/2022    9:02 AM 10/25/2021   10:30 AM  Diabetic Labs  HbA1c 4.8 - 5.6 %  6.4  6.7  6.4  6.5   Micro/Creat Ratio 0 - 29 mg/g creat  20    32   Chol 100 - 199 mg/dL  409  811  914  782   HDL >39 mg/dL  42  85  75  84   Calc LDL 0 - 99 mg/dL  76  59  67  956   Triglycerides 0 - 149 mg/dL  96  64  54  67   Creatinine 0.76 - 1.27 mg/dL 2.13  0.86  5.78  4.69  0.79       04/08/2023    9:35 AM 03/20/2023    9:09 AM 03/13/2023    2:25 PM 03/13/2023    2:24 PM 10/07/2022    9:03 AM 09/10/2022    9:48 AM 09/10/2022    9:30 AM  BP/Weight  Systolic BP 116 118 108 95 115 131 147  Diastolic BP 66 73 75 59 61 66 78  Wt. (Lbs)  105.8  108.8 120    BMI  16.57 kg/m2  17.04 kg/m2 18.79 kg/m2        Latest Ref Rng & Units 08/28/2022   12:00 AM 10/25/2021    9:40 AM  Foot/eye exam completion dates  Eye Exam No Retinopathy No Retinopathy       Foot Form Completion   Done     This result is from an external source.      Updated lab needed at/ before next visit.

## 2023-05-08 NOTE — Progress Notes (Signed)
 Ian Ian, male    DOB: 12-02-1950,  [name]: [id number]   Brief patient profile:  73 yobm quit smoking 2017  illiterate   grew up with dx of asthma but only started needing in saba in HS when started smoking and eventually placed on advair  500 and followed by Ian Ian and referred to pulmonary clinic in [facility]  05/11/2019 by Ian Ian p Ian Ian retired     History of Present Illness  05/11/2019  Pulmonary/ 1st office eval/Ian Ian on advair  500  Chief Complaint  Patient presents with   Pulmonary Consult    Referred by Ian Barges, NP. Former patient of Ian Ian. He states his breathing has been worse since he had first covid vaccine 03/03/19. He gets SOB when he wakes up in the am and starts moving around. He has cough with cream colored sputum. He is using his albuterol  inhaler about every 6 hours and duonebs 2 x daily.   Dyspnea:  Can't walk a block = MMRC3 = can't walk 100 yards even at a slow pace at a flat grade s stopping due to sob   Cough: min white mucus  Sleep: bed is flat 2 pillows  SABA use: way too much as above 02 :  Only uses 02 p sits down  rec Stop wixela, atrovent  (ipatropium solution) and combivent   Plan A = Automatic = Always=    Trelegy one click each am - take two Ian Ian Plan B = Backup (to supplement plan A, not to replace it) Only use your albuterol  inhaler (PROAIR )  as a rescue medication   Plan C = Crisis (instead of Plan B but only if Plan B stops working) - only use your albuterol  nebulizer if you first try Plan B and it fails to help > ok to use the nebulizer up to every 4 hours but if start needing it regularly call for immediate appointment  Prednisone  10 mg take  4 each am x 2 days,   2 each am x 2 days,  1 each am x 2 days and stop  Make sure you check your oxygen  saturations at highest level of activity (not after you stop!) to be sure it stays over 90% and adjust upward to maintain this level if needed but remember to turn it back to  previous settings when you stop (to conserve your supply).     CT chest 07/05/19 >> mild centrilobular and paraseptal emphysema  PFT 07/29/19 >> FEV1 0.63 (24%),  Ratio 0.32, TLC 9.24 (143%), DLCO 63%      11/25/2019  f/u ov/ office/Ian Ian re: chest tight  Chief Complaint  Patient presents with   Follow-up    feel tight in chest since took breztri  this morning   Dyspnea:  Worse today so took 2 prednisone , has been on breztri  2bid but says got worse after am dose with chest tightness generailized, did not think to try his neb, rode moped instead to office and requested to be seen  Cough: dry  Sleeping: ok  SABA use: about twice daily / neb not today/ very limited insight into how/ when to use meds  02: prn  Rec No change in medications/ instructions    [name] eval 10/07/22 COPD/bronchiectasis exacerbation. - will give him a course of cefuroxime    Severe COPD with emphysema. - previously seen by Ian. Zoila Ian and Ian. Waymond Ian - has been prednisone  dependent since July 2021 - don't think he needs an increase in prednisone  at this  time - continue trelegy 100 one puff daily, prednisone  2.5 mg daily, singulair  10 mg nightly - prn albuterol  - he has a nebulizer   Bronchiectasis. - likely from prior episodes of pneumonia - prn mucinex , flutter valve   Chronic hypoxic/hypercapnic respiratory failure. - 3 liters with exertion and sleep - uses [employer] for his DME - discussed importance of maintaining compliance with supplemental oxygen    Lung nodule. - he will need follow up low dose CT chest in January 2025   Social determinants of health. - he has limited reading ability which impacts his ability to follow through with medical instructions   05/09/2023  Re-establish  ov/Ian Ian office/Ian Ian re: GOLD4 COPD on prn 02  maint on trelegy / pred 5 mg half daily /  did  bring meds / uses trelegy at hs s noct symptoms historically  Chief Complaint  Patient presents with   COPD    Shortness of Breath   Cough  Dyspnea:  able to do [employer]/ grocery shopping Ian pace  Cough: no am flares - says  nasal congestion from nasal 02 / some green mucus from nose and throat  Sleeping: bed is flat/ 2 pillows    resp cc  SABA use: hfa once a day / neb once a week  02: 3lpm hs   Lung cancer screening: q feb    No obvious day to day or daytime variability or assoc  mucus plugs or hemoptysis or cp or chest tightness, subjective wheeze or overt sinus or hb symptoms.    Also denies any obvious fluctuation of symptoms with weather or environmental changes or other aggravating or alleviating factors except as outlined above   No unusual exposure hx or h/o childhood pna/ asthma or knowledge of premature birth.  Current Allergies, Complete Past Medical History, Past Surgical History, Family History, and Social History were reviewed in [employer] record.  ROS  The following are not active complaints unless bolded Hoarseness, sore throat, dysphagia, dental problems, itching, sneezing,  nasal congestion or discharge of excess mucus or purulent secretions, ear ache,   fever, chills, sweats, unintended wt loss or wt gain, classically pleuritic or exertional cp,  orthopnea pnd or arm/hand swelling  or leg swelling, presyncope, palpitations, abdominal pain, anorexia, nausea, vomiting, diarrhea  or change in bowel habits or change in bladder habits, change in stools or change in urine, dysuria, hematuria,  rash, arthralgias, visual complaints, headache, numbness, weakness or ataxia or problems with walking or coordination,  change in mood or  memory.        Current Meds  Medication Sig   albuterol  (PROVENTIL ) (2.5 MG/3ML) 0.083% nebulizer solution Take 3 mLs (2.5 mg total) by nebulization every 6 (six) hours as needed for wheezing or shortness of breath.   albuterol  (VENTOLIN  HFA) 108 (90 Base) MCG/ACT inhaler Inhale 1-2 puffs into the lungs every 6 (six) hours as needed  for wheezing or shortness of breath.   amLODipine  (NORVASC ) 10 MG tablet Take 1 tablet (10 mg total) by mouth daily.   aspirin  EC 81 MG tablet Take 81 mg by mouth daily. Swallow whole.   Fluticasone -Umeclidin-Vilant (TRELEGY ELLIPTA ) 100-62.5-25 MCG/ACT AEPB Inhale 1 puff into the lungs daily at 2 am.   levocetirizine (XYZAL ) 5 MG tablet Take 1 tablet (5 mg total) by mouth every evening.   metFORMIN  (GLUCOPHAGE ) 500 MG tablet Take 1 tablet (500 mg total) by mouth 2 (two) times daily with a meal.   montelukast  (SINGULAIR ) 10 MG tablet  Take 1 tablet (10 mg total) by mouth at bedtime.   predniSONE  (DELTASONE ) 5 MG tablet TAKE 1 TABLET EVERY DAY WITH BREAKFAST   rosuvastatin  (CRESTOR ) 20 MG tablet Take 1 tablet (20 mg total) by mouth daily.   triamterene -hydrochlorothiazide  (MAXZIDE -25) 37.5-25 MG tablet TAKE 1 AND 1/2 TABLETS EVERY DAY.               Past Medical History:  Diagnosis Date   Asthma    CAP (community acquired pneumonia) 12/10/2018   COPD (chronic obstructive pulmonary disease) (HCC)    COPD with acute exacerbation (HCC) 04/18/2018   CVA (cerebral vascular accident) (HCC) 2008   with temporary vision loss    Depression    ECHOCARDIOGRAM, ABNORMAL 03/06/2010   Qualifier: Diagnosis of  By: Salena Craven, NP, Kathryn     Elevated PSA 2014   no diagnosis pf prostate cancer in 07/2014   History of substance abuse (HCC) 01/14/2011   Marijuana   History of substance abuse (HCC) 01/14/2011   History of tobacco abuse 01/14/2011   Hyperlipidemia    Hypertension    Lobar pneumonia (HCC) 12/13/2018   Marijuana abuse    Nicotine addiction    Prediabetes 2014      Objective:    Wts  05/09/2023          111  11/25/2019      124 10/15/2019        126  08/20/2019        130  08/03/2019        122  07/28/2019        121  07/09/2019          122   06/10/2019         122   05/28/19 118 lb 8 oz (53.8 kg)  05/11/19 122 lb (55.3 kg)  04/16/19 122 lb 12.8 oz (55.7 kg)   Vital signs reviewed   05/09/2023  - Note at rest 02 sats  92% on RA   General appearance:    amb thin bm nad  HEENT :  Oropharynx  clear /edentulous   Nasal turbinates mod swelling with mucopurulent discharge    NECK :  without JVD/Nodes/TM/ nl carotid upstrokes bilaterally   LUNGS: no acc muscle use,  Mod barrel  contour chest wall with bilateral  Distantinsp/exp rhonchi exp in bases and  without cough on insp or exp maneuvers and mod  Hyperresonant  to  percussion bilaterally     CV:  RRR  no s3 or murmur or increase in P2, and no edema   ABD:  soft and nontender with pos mid insp Hoover's  in the supine position. No bruits or organomegaly appreciated, bowel sounds nl  MS:   Ext warm without deformities or   obvious joint restrictions , calf tenderness, cyanosis or clubbing  SKIN: warm and dry without lesions    NEURO:  alert, approp, nl sensorium with  no motor or cerebellar deficits apparent.     I personally reviewed images and agree with radiology impression as follows:   Chest LDSCT     02/27/23      Lungs/Pleura: No pneumothorax. No pleural effusion. Mild paraseptal and centrilobular emphysema with diffuse bronchial wall thickening. No acute consolidative airspace disease or lung masses. No significant growth of previously visualized pulmonary nodules. New solid right upper lobe 5.3 mm pulmonary nodule on series 4/image 120. No additional new significant pulmonary nodules. Chronic scattered moderate varicoid bronchiectasis, most prominent in the right greater  than left lower lobes.   I personally reviewed images and agree with radiology impression as follows:  CXR:   pa and lateral cxr  04/14/23  1. Interval improvement in right middle lobe airspace disease which now appears atelectatic with associated volume loss. I suspect this likely reflects post inflammatory change. Recommend repeat chest radiograph in 1-3 months. 2. COPD/emphysema.        Assessment

## 2023-05-09 ENCOUNTER — Encounter: Payer: Self-pay | Admitting: Internal Medicine

## 2023-05-09 ENCOUNTER — Ambulatory Visit: Admitting: Internal Medicine

## 2023-05-09 VITALS — BP 148/78 | HR 103 | Ht 67.0 in | Wt 111.0 lb

## 2023-05-09 DIAGNOSIS — Z87891 Personal history of nicotine dependence: Secondary | ICD-10-CM

## 2023-05-09 DIAGNOSIS — G4734 Idiopathic sleep related nonobstructive alveolar hypoventilation: Secondary | ICD-10-CM | POA: Diagnosis not present

## 2023-05-09 DIAGNOSIS — Z7722 Contact with and (suspected) exposure to environmental tobacco smoke (acute) (chronic): Secondary | ICD-10-CM | POA: Diagnosis not present

## 2023-05-09 DIAGNOSIS — J449 Chronic obstructive pulmonary disease, unspecified: Secondary | ICD-10-CM

## 2023-05-09 DIAGNOSIS — J432 Centrilobular emphysema: Secondary | ICD-10-CM

## 2023-05-09 DIAGNOSIS — R918 Other nonspecific abnormal finding of lung field: Secondary | ICD-10-CM | POA: Diagnosis not present

## 2023-05-09 MED ORDER — AMOXICILLIN-POT CLAVULANATE 875-125 MG PO TABS
1.0000 | ORAL_TABLET | Freq: Two times a day (BID) | ORAL | 0 refills | Status: DC
Start: 2023-05-09 — End: 2023-05-30

## 2023-05-09 NOTE — Assessment & Plan Note (Signed)
 Quit smoking 2017  - 05/11/2019  After extensive coaching inhaler device,  effectiveness =    90% elipta so rec change to trelegy  - 05/11/2019   Walked on RA   300 ft -@ avg pace stopped due to sob with sats at end  93%  - 05/28/2019  After extensive coaching inhaler device,  effectiveness =    75% changed to breztri  trial x 2 week samples -  05/28/2019   Walked RA  approx   300 ft  @ moderately fast pace  stopped due to  Sob/ sats still 90%   - 07/28/2019  After extensive coaching inhaler device,  effectiveness =    75% (sort ti) >continue breztri  2bid PFT's  07/29/19  FEV1 0.63 (24 % ) ratio 0.32  p 0 % improvement from saba p ? prior to study with DLCO  14.78 (63%) corrects to 5.03 (123%)  for alv volume and FV curve classic curvature  - 08/03/2019   continue breztri    - 08/03/19  Prednisone  as maint 20 ceiling and 10 mg floor  -  08/03/2019   Walked RA  approx   300 ft  @ moderate pace  stopped due to  End of study no sob and sats 96% at end  -  05/09/2023  After extensive coaching inhaler device,  effectiveness =    90% with dpi/elipta and 75% with hfa (short ti) > continue trelegy 100 and prn saba

## 2023-05-09 NOTE — Assessment & Plan Note (Addendum)
 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 Constellation of findings is generally consistent with ongoing, worsened atypical infection, particularly atypical mycobacterium. There may be a component or history of aspiration. -07/09/2019  rec  levaquin  500 mg daily x 10 days   - CT chest 08/13/19 c/w bronchiectasis esp lower lobe - cxr 10/15/2019 clear  - cxr 04/12/23 improving RML infiltrate with residual atx  - 05/09/2023 green sputum/ nasal discharge > augmentin  bid x 10 days

## 2023-05-09 NOTE — Addendum Note (Signed)
 Addended by: Alyse Bach on: 05/09/2023 10:08 AM   Modules accepted: Orders

## 2023-05-09 NOTE — Patient Instructions (Addendum)
 We will ask your DME company to humidify your oxygen    Plan A = Automatic = Always=  Trelegy 100  one click each am x 2 deep drags and  breath out thru nose   Plan B = Backup (to supplement plan A, not to replace it) Only use your albuterol  inhaler as a rescue medication to be used if you can't catch your breath by resting or doing a relaxed purse lip breathing pattern.  - The less you use it, the better it will work when you need it. - Ok to use the inhaler up to 2 puffs  every 4 hours if you must but call for appointment if use goes up over your usual need - Don't leave home without it !!  (think of it like the spare tire for your car)   Plan C = Crisis (instead of Plan B but only if Plan B stops working) - only use your albuterol  nebulizer if you first try Plan B and it fails to help > ok to use the nebulizer up to every 4 hours but if start needing it regularly call for immediate appointment   Augmentin  875 mg take one pill twice daily  X 10 days - take at breakfast and supper with large glass of water.  It would help reduce the usual side effects (diarrhea and yeast infections) if you ate cultured yogurt at lunch.    Please schedule a follow up visit in 3 months but call sooner if needed

## 2023-05-09 NOTE — Assessment & Plan Note (Addendum)
 As of 05/09/2023 on 3lpm hs  - 05/09/2023   Walked on RA  x  3  lap(s) =  approx 450  ft  @ mod pace, stopped due to end of study  with lowest 02 sats 92% and no sob  - added water to concentrator due to nasal congestion 05/09/2023 >>>   No other changes in rx needed   F/u can be q 3 m, sooner prn   Each maintenance medication was reviewed in detail including emphasizing most importantly the difference between maintenance and prns and under what circumstances the prns are to be triggered using an action plan format where appropriate.  Total time for H and P, chart review, counseling, reviewing hfa/dpi/02 device(s) , directly observing portions of ambulatory 02 saturation study/ and generating customized AVS unique to this office visit / same day charting = 43 min for pt not seen by me in > 3 y

## 2023-05-30 ENCOUNTER — Ambulatory Visit (INDEPENDENT_AMBULATORY_CARE_PROVIDER_SITE_OTHER): Admitting: Family Medicine

## 2023-05-30 ENCOUNTER — Encounter: Payer: Self-pay | Admitting: Family Medicine

## 2023-05-30 VITALS — BP 135/67 | HR 95 | Temp 98.2°F | Ht 67.0 in | Wt 109.0 lb

## 2023-05-30 DIAGNOSIS — Z0001 Encounter for general adult medical examination with abnormal findings: Secondary | ICD-10-CM | POA: Diagnosis not present

## 2023-05-30 DIAGNOSIS — E118 Type 2 diabetes mellitus with unspecified complications: Secondary | ICD-10-CM

## 2023-05-30 NOTE — Assessment & Plan Note (Signed)
 Diabetes associated with hypertension and hyperlipidemia  [name] is reminded of the importance of commitment to daily physical activity for 30 minutes or more, as able and the need to limit carbohydrate intake to 30 to 60 grams per meal to help with blood sugar control.   The need to take medication as prescribed, test blood sugar as directed, and to call between visits if there is a concern that blood sugar is uncontrolled is also discussed.   [name] is reminded of the importance of daily foot exam, annual eye examination, and good blood sugar, blood pressure and cholesterol control.     Latest Ref Rng & Units 03/28/2023    9:00 AM 03/20/2023   10:20 AM 08/26/2022    9:29 AM 02/19/2022    9:02 AM 10/25/2021   10:30 AM  Diabetic Labs  HbA1c 4.8 - 5.6 %  6.4  6.7  6.4  6.5   Micro/Creat Ratio 0 - 29 mg/g creat  20    32   Chol 100 - 199 mg/dL  161  096  045  409   HDL >39 mg/dL  42  85  75  84   Calc LDL 0 - 99 mg/dL  76  59  67  811   Triglycerides 0 - 149 mg/dL  96  64  54  67   Creatinine 0.76 - 1.27 mg/dL 9.14  7.82  9.56  2.13  0.79       05/30/2023   10:39 AM 05/09/2023    9:09 AM 04/08/2023    9:35 AM 03/20/2023    9:09 AM 03/13/2023    2:25 PM 03/13/2023    2:24 PM 10/07/2022    9:03 AM  BP/Weight  Systolic BP 135 148 116 118 108 95 115  Diastolic BP 67 78 66 73 75 59 61  Wt. (Lbs) 109.04 111  105.8  108.8 120  BMI 17.08 kg/m2 17.39 kg/m2  16.57 kg/m2  17.04 kg/m2 18.79 kg/m2      Latest Ref Rng & Units 08/28/2022   12:00 AM 10/25/2021    9:40 AM  Foot/eye exam completion dates  Eye Exam No Retinopathy No Retinopathy       Foot Form Completion   Done     This result is from an external source.

## 2023-05-30 NOTE — Patient Instructions (Addendum)
 Please schedule wellness at checkout  F/u in office with mD early October call if you need me before  Lab today before you leave to check kidney function  No changes in medication today  Thanks for choosing [facility], we consider it a privelige to serve you.

## 2023-05-31 LAB — BMP8+EGFR
BUN/Creatinine Ratio: 7 — ABNORMAL LOW (ref 10–24)
BUN: 8 mg/dL (ref 8–27)
CO2: 24 mmol/L (ref 20–29)
Calcium: 9.9 mg/dL (ref 8.6–10.2)
Chloride: 100 mmol/L (ref 96–106)
Creatinine, Ser: 1.08 mg/dL (ref 0.76–1.27)
Glucose: 101 mg/dL — ABNORMAL HIGH (ref 70–99)
Potassium: 3.9 mmol/L (ref 3.5–5.2)
Sodium: 143 mmol/L (ref 134–144)
eGFR: 72 mL/min/{1.73_m2} (ref >59)

## 2023-06-01 ENCOUNTER — Ambulatory Visit: Payer: Self-pay | Admitting: Family Medicine

## 2023-06-01 NOTE — Progress Notes (Signed)
 [name]     [name]: [id number]      DOB: 07/04/1950  Chief Complaint  Patient presents with   Annual Exam    HPI: Patient is in for annual physical exam. No other health concerns are expressed or addressed at the visit. Recent labs, if available are reviewed. Immunization is reviewed , and  updated if needed.    PE; BP 135/67   Pulse 95   Temp 98.2 F (36.8 C) (Oral)   Ht 5\' 7"  (1.702 m)   Wt 109 lb 0.6 oz (49.5 kg)   SpO2 91%   BMI 17.08 kg/m   Pleasant male, alert and oriented x 3, in no cardio-pulmonary distress. Afebrile. HEENT No facial trauma or asymetry. Sinuses non tender. EOMI External ears normal,  Neck: supple, no adenopathy,JVD or thyromegaly.No bruits.  Chest: Clear to ascultation bilaterally.No crackles or wheezes. Non tender to palpation  Cardiovascular system; Heart sounds normal,  S1 and  S2 ,no S3.  No murmur, or thrill. Apical beat not displaced Peripheral pulses normal.  Abdomen: Soft, non tender, no organomegaly or masses. No bruits. Bowel sounds normal. No guarding, tenderness or rebound.    Musculoskeletal exam: Full ROM of spine, hips , shoulders and knees. No deformity ,swelling or crepitus noted. No muscle wasting or atrophy.   Neurologic: Cranial nerves 2 to 12 intact. Power, tone ,sensation and reflexes normal throughout. No disturbance in gait. No tremor.  Skin: Intact, no ulceration, erythema , scaling or rash noted. Pigmentation normal throughout  Psych; Normal mood and affect. Judgement and concentration normal   Assessment & Plan:  Controlled diabetes mellitus type 2 with complications (HCC) Diabetes associated with hypertension and hyperlipidemia  [name] is reminded of the importance of commitment to daily physical activity for 30 minutes or more, as able and the need to limit carbohydrate intake to 30 to 60 grams per meal to help with blood sugar control.   The need to take medication as  prescribed, test blood sugar as directed, and to call between visits if there is a concern that blood sugar is uncontrolled is also discussed.   [name] is reminded of the importance of daily foot exam, annual eye examination, and good blood sugar, blood pressure and cholesterol control.     Latest Ref Rng & Units 03/28/2023    9:00 AM 03/20/2023   10:20 AM 08/26/2022    9:29 AM 02/19/2022    9:02 AM 10/25/2021   10:30 AM  Diabetic Labs  HbA1c 4.8 - 5.6 %  6.4  6.7  6.4  6.5   Micro/Creat Ratio 0 - 29 mg/g creat  20    32   Chol 100 - 199 mg/dL  409  811  914  782   HDL >39 mg/dL  42  85  75  84   Calc LDL 0 - 99 mg/dL  76  59  67  956   Triglycerides 0 - 149 mg/dL  96  64  54  67   Creatinine 0.76 - 1.27 mg/dL 2.13  0.86  5.78  4.69  0.79       05/30/2023   10:39 AM 05/09/2023    9:09 AM 04/08/2023    9:35 AM 03/20/2023    9:09 AM 03/13/2023    2:25 PM 03/13/2023    2:24 PM 10/07/2022    9:03 AM  BP/Weight  Systolic BP 135 148 116 118 108 95 115  Diastolic BP 67 78 66 73  75 59 61  Wt. (Lbs) 109.04 111  105.8  108.8 120  BMI 17.08 kg/m2 17.39 kg/m2  16.57 kg/m2  17.04 kg/m2 18.79 kg/m2      Latest Ref Rng & Units 08/28/2022   12:00 AM 10/25/2021    9:40 AM  Foot/eye exam completion dates  Eye Exam No Retinopathy No Retinopathy       Foot Form Completion   Done     This result is from an external source.        Encounter for Medicare annual examination with abnormal findings Annual exam as documented. . Immunization and cancer screening needs are specifically addressed at this visit.

## 2023-06-01 NOTE — Assessment & Plan Note (Signed)
 Annual exam as documented. . Immunization and cancer screening needs are specifically addressed at this visit.

## 2023-06-02 ENCOUNTER — Other Ambulatory Visit: Payer: Self-pay | Admitting: Family Medicine

## 2023-07-29 ENCOUNTER — Other Ambulatory Visit: Payer: Self-pay | Admitting: Family Medicine

## 2023-08-05 NOTE — Progress Notes (Unsigned)
 [name], male    DOB: 1950/07/21,  [name]: [id number]   Brief patient profile:  73 yobm quit smoking 2017  illiterate   grew up with dx of asthma but only started needing in saba in HS when started smoking and eventually placed on advair  500 and followed by Dr Vonzell and referred to pulmonary clinic in [facility]  05/11/2019 by Chiquita Barefoot p Dr Vonzell retired     History of Present Illness  05/11/2019  Pulmonary/ 1st office eval/June Vacha on advair  500  Chief Complaint  Patient presents with   Pulmonary Consult    Referred by Chiquita Barefoot, NP. Former patient of Dr Vonzell. He states his breathing has been worse since he had first covid vaccine 03/03/19. He gets SOB when he wakes up in the am and starts moving around. He has cough with cream colored sputum. He is using his albuterol  inhaler about every 6 hours and duonebs 2 x daily.   Dyspnea:  Can't walk a block = MMRC3 = can't walk 100 yards even at a slow pace at a flat grade s stopping due to sob   Cough: min white mucus  Sleep: bed is flat 2 pillows  SABA use: way too much as above 02 :  Only uses 02 p sits down  rec Stop wixela, atrovent  (ipatropium solution) and combivent   Plan A = Automatic = Always=    Trelegy one click each am - take two good drags Plan B = Backup (to supplement plan A, not to replace it) Only use your albuterol  inhaler (PROAIR )  as a rescue medication   Plan C = Crisis (instead of Plan B but only if Plan B stops working) - only use your albuterol  nebulizer if you first try Plan B and it fails to help > ok to use the nebulizer up to every 4 hours but if start needing it regularly call for immediate appointment  Prednisone  10 mg take  4 each am x 2 days,   2 each am x 2 days,  1 each am x 2 days and stop  Make sure you check your oxygen  saturations at highest level of activity (not after you stop!) to be sure it stays over 90% and adjust upward to maintain this level if needed but remember to turn it back to  previous settings when you stop (to conserve your supply).     CT chest 07/05/19 >> mild centrilobular and paraseptal emphysema  PFT 07/29/19 >> FEV1 0.63 (24%),  Ratio 0.32, TLC 9.24 (143%), DLCO 63%      11/25/2019  f/u ov/[name] re: chest tight  Chief Complaint  Patient presents with   Follow-up    feel tight in chest since took breztri  this morning   Dyspnea:  Worse today so took 2 prednisone , has been on breztri  2bid but says got worse after am dose with chest tightness generailized, did not think to try his neb, rode moped instead to office and requested to be seen  Cough: dry  Sleeping: ok  SABA use: about twice daily / neb not today/ very limited insight into how/ when to use meds  02: prn  Rec No change in medications/ instructions    [place] eval 10/07/22 COPD/bronchiectasis exacerbation. - will give him a course of cefuroxime    Severe COPD with emphysema. - previously seen by Dr. Vonzell and Dr. Darlean - has been prednisone  dependent since July 2021 - don't think he needs an increase in prednisone  at this  time - continue trelegy 100 one puff daily, prednisone  2.5 mg daily, singulair  10 mg nightly - prn albuterol  - he has a nebulizer   Bronchiectasis. - likely from prior episodes of pneumonia - prn mucinex , flutter valve   Chronic hypoxic/hypercapnic respiratory failure. - 3 liters with exertion and sleep - uses [employer] for his DME - discussed importance of maintaining compliance with supplemental oxygen    Lung nodule. - he will need follow up low dose CT chest in January 2025   Social determinants of health. - he has limited reading ability which impacts his ability to follow through with medical instructions   05/09/2023  Re-establish  ov/[place] office/[name] re: GOLD4 COPD on prn 02  maint on trelegy / pred 5 mg half daily /  did  bring meds / uses trelegy at hs s noct symptoms historically  Chief Complaint  Patient presents with   COPD    Shortness of Breath   Cough  Dyspnea:  able to do [employer]/ grocery shopping good pace  Cough: no am flares - says  nasal congestion from nasal 02 / some green mucus from nose and throat  Sleeping: bed is flat/ 2 pillows    resp cc  SABA use: hfa once a day / neb once a week  02: 3lpm hs  Lung cancer screening: q feb  Rec     08/08/2023  f/u ov/[place] office/[name] re: *** maint on ***  No chief complaint on file.   Dyspnea:  *** Cough: *** Sleeping: ***   resp cc  SABA use: *** 02: ***  Lung cancer screening: ***   No obvious day to day or daytime variability or assoc excess/ purulent sputum or mucus plugs or hemoptysis or cp or chest tightness, subjective wheeze or overt sinus or hb symptoms.    Also denies any obvious fluctuation of symptoms with weather or environmental changes or other aggravating or alleviating factors except as outlined above   No unusual exposure hx or h/o childhood pna/ asthma or knowledge of premature birth.  Current Allergies, Complete Past Medical History, Past Surgical History, Family History, and Social History were reviewed in [employer] record.  ROS  The following are not active complaints unless bolded Hoarseness, sore throat, dysphagia, dental problems, itching, sneezing,  nasal congestion or discharge of excess mucus or purulent secretions, ear ache,   fever, chills, sweats, unintended wt loss or wt gain, classically pleuritic or exertional cp,  orthopnea pnd or arm/hand swelling  or leg swelling, presyncope, palpitations, abdominal pain, anorexia, nausea, vomiting, diarrhea  or change in bowel habits or change in bladder habits, change in stools or change in urine, dysuria, hematuria,  rash, arthralgias, visual complaints, headache, numbness, weakness or ataxia or problems with walking or coordination,  change in mood or  memory.        No outpatient medications have been marked as taking for the 08/08/23 encounter  (Appointment) with Ian Ozell NOVAK, MD.               Past Medical History:  Diagnosis Date   Asthma    CAP (community acquired pneumonia) 12/10/2018   COPD (chronic obstructive pulmonary disease) (HCC)    COPD with acute exacerbation (HCC) 04/18/2018   CVA (cerebral vascular accident) [facility]) 2008   with temporary vision loss    Depression    ECHOCARDIOGRAM, ABNORMAL 03/06/2010   Qualifier: Diagnosis of  By: Jerilynn, NP, Lamarr     Elevated PSA 2014   no  diagnosis pf prostate cancer in 07/2014   History of substance abuse (HCC) 01/14/2011   Marijuana   History of substance abuse (HCC) 01/14/2011   History of tobacco abuse 01/14/2011   Hyperlipidemia    Hypertension    Lobar pneumonia (HCC) 12/13/2018   Marijuana abuse    Nicotine addiction    Prediabetes 2014      Objective:    Wts  08/08/2023          ***  05/09/2023          111  11/25/2019      124 10/15/2019        126  08/20/2019        130  08/03/2019        122  07/28/2019        121  07/09/2019          122   06/10/2019         122   05/28/19 118 lb 8 oz (53.8 kg)  05/11/19 122 lb (55.3 kg)  04/16/19 122 lb 12.8 oz (55.7 kg)    Vital signs reviewed  08/08/2023  - Note at rest 02 sats  ***% on ***   General appearance:    ***    Mod barrel  contour chest wall with bilateral  Distantinsp/exp rhonchi exp in bases  ***                 Assessment

## 2023-08-08 ENCOUNTER — Ambulatory Visit: Admitting: Internal Medicine

## 2023-08-08 ENCOUNTER — Encounter: Payer: Self-pay | Admitting: Internal Medicine

## 2023-08-08 VITALS — BP 148/66 | HR 91 | Ht 67.0 in | Wt 112.8 lb

## 2023-08-08 DIAGNOSIS — J449 Chronic obstructive pulmonary disease, unspecified: Secondary | ICD-10-CM | POA: Diagnosis not present

## 2023-08-08 DIAGNOSIS — G4734 Idiopathic sleep related nonobstructive alveolar hypoventilation: Secondary | ICD-10-CM | POA: Diagnosis not present

## 2023-08-08 DIAGNOSIS — Z87891 Personal history of nicotine dependence: Secondary | ICD-10-CM | POA: Diagnosis not present

## 2023-08-08 DIAGNOSIS — J310 Chronic rhinitis: Secondary | ICD-10-CM

## 2023-08-08 MED ORDER — BREZTRI AEROSPHERE 160-9-4.8 MCG/ACT IN AERO: 2.0000 | INHALATION_SPRAY | Freq: Two times a day (BID) | RESPIRATORY_TRACT | Status: AC

## 2023-08-08 MED ORDER — BREZTRI AEROSPHERE 160-9-4.8 MCG/ACT IN AERO: INHALATION_SPRAY | RESPIRATORY_TRACT | 11 refills | Status: AC

## 2023-08-08 NOTE — Assessment & Plan Note (Addendum)
 Quit smoking 2017 so eligible for LSDSCT thru age 73  - LDSCT  2/20225 new nodule > repeat study as requested by radiology scheduled 08/08/2023   Likely inflammatory but rec follow Fleischner society guideline for f/u CT as planned   Discussed in detail all the  indications, usual  risks and alternatives  relative to the benefits with patient who agrees to proceed with w/u as outlined.     Each maintenance medication was reviewed in detail including emphasizing most importantly the difference between maintenance and prns and under what circumstances the prns are to be triggered using an action plan format where appropriate.  Total time for H and P, chart review, counseling, reviewing hfa device(s) , directly observing portions of ambulatory 02 saturation study/ and generating customized AVS unique to this office visit / same day charting = 42 min   for multiple chronic  refractory respiratory  symptoms of uncertain etiology and f/u CT planning/ challenged by illiteracy and unable drive a car for appts

## 2023-08-08 NOTE — Assessment & Plan Note (Signed)
 Sinus CT 07/05/2019 >>> chronic rhinitis/ sinusitis/ nothing acute -  Allergy screen 08/08/2023 >  Eos 0. /  IgE  pending > if neg refer to [facility]   Some of his upper airway symptoms may be related to use of DPI's so changed back to [facility] today

## 2023-08-08 NOTE — Patient Instructions (Addendum)
 My office will be contacting you by phone for referral to CT CHEST LCS NODULE FOLLOW-UP W/O CM)  - if you don't hear back from my office within one week please call us  back or notify us  thru MyChart and we'll address it right away.    Change Trelegy 100 to breztri  Take 2 puffs first thing in am and then another 2 puffs about 12 hours later.     Work on inhaler technique:  relax and gently blow all the way out then take a nice smooth full deep breath back in, triggering the inhaler at same time you start breathing in.  Hold breath in for at least  5 seconds if you can. Blow out breztr  thru nose. Rinse and gargle with water when done.  If mouth or throat bother you at all,  try brushing teeth/gums/tongue with arm and hammer toothpaste/ make a slurry and gargle and spit out.   Please remember to go to the lab department   for your tests - we will call you with the results when they are available.      Please schedule a follow up visit in 3 months but call sooner if needed

## 2023-08-08 NOTE — Assessment & Plan Note (Addendum)
 As of 05/09/2023 on 3lpm hs  - 05/09/2023   Walked on RA  x  3  lap(s) =  approx 450  ft  @ mod pace, stopped due to end of study  with lowest 02 sats 92% and no sob  - added water to concentrator due to nasal congestion 05/09/2023 >>>  too much nasal drainage so stopped it as of 08/08/2023  - 08/08/2023   Walked on RA  x  2   lap(s) =  approx 450   ft  @ slow/mod pace, stopped due to  doe with lowest 02 sats 94%    No need for daytime 02  - may benefit from pulmonary rehab if willing to go as lives only a few blocks away  For now try back on breztri  as noted and try to sort out his refractory nasal symtoms as above

## 2023-08-08 NOTE — Assessment & Plan Note (Signed)
 Quit smoking 2017  - 05/11/2019  After extensive coaching inhaler device,  effectiveness =    90% elipta so rec change to trelegy  - 05/11/2019   Walked on RA   300 ft -@ avg pace stopped due to sob with sats at end  93%  - 05/28/2019  After extensive coaching inhaler device,  effectiveness =    75% changed to breztri  trial x 2 week samples -  05/28/2019   Walked RA  approx   300 ft  @ moderately fast pace  stopped due to  Sob/ sats still 90%   - 07/28/2019  After extensive coaching inhaler device,  effectiveness =    75% (sort ti) >continue breztri  2bid PFT's  07/29/19  FEV1 0.63 (24 % ) ratio 0.32  p 0 % improvement from saba p ? prior to study with DLCO  14.78 (63%) corrects to 5.03 (123%)  for alv volume and FV curve classic curvature  - 08/03/2019   continue breztri    - 08/03/19  Prednisone  as maint 20 ceiling and 10 mg floor  -  08/03/2019   Walked RA  approx   300 ft  @ moderate pace  stopped due to  End of study no sob and sats 96% at end  -  05/09/2023  After extensive coaching inhaler device,  effectiveness =    90% with dpi/elipta and 75% with hfa (short ti) > continue trelegy 100 and prn saba - 08/08/2023  After extensive coaching inhaler device,  effectiveness =    75% (short Ti with hfa) change back to breztri  as he feels trelegy making him cough    Group D (now reclassified as E) in terms of symptom/risk and laba/lama/ICS  therefore appropriate rx at this point >>>  trelegy plus approp saba

## 2023-08-11 LAB — CBC WITH DIFFERENTIAL/PLATELET
Basophils Absolute: 0 x10E3/uL (ref 0.0–0.2)
Basos: 1 %
EOS (ABSOLUTE): 0.3 x10E3/uL (ref 0.0–0.4)
Eos: 5 %
Hematocrit: 35.4 % — ABNORMAL LOW (ref 37.5–51.0)
Hemoglobin: 11 g/dL — ABNORMAL LOW (ref 13.0–17.7)
Immature Grans (Abs): 0 x10E3/uL (ref 0.0–0.1)
Immature Granulocytes: 0 %
Lymphocytes Absolute: 2 x10E3/uL (ref 0.7–3.1)
Lymphs: 37 %
MCH: 28.6 pg (ref 26.6–33.0)
MCHC: 31.1 g/dL — ABNORMAL LOW (ref 31.5–35.7)
MCV: 92 fL (ref 79–97)
Monocytes Absolute: 0.5 x10E3/uL (ref 0.1–0.9)
Monocytes: 9 %
Neutrophils Absolute: 2.7 x10E3/uL (ref 1.4–7.0)
Neutrophils: 47 %
Platelets: 252 x10E3/uL (ref 150–450)
RBC: 3.84 x10E6/uL — ABNORMAL LOW (ref 4.14–5.80)
RDW: 12.9 % (ref 11.6–15.4)
WBC: 5.5 x10E3/uL (ref 3.4–10.8)

## 2023-08-11 LAB — IGE: IgE (Immunoglobulin E), Serum: 47 [IU]/mL (ref 6–495)

## 2023-09-03 ENCOUNTER — Ambulatory Visit

## 2023-09-03 VITALS — Ht 67.0 in | Wt 150.0 lb

## 2023-09-03 DIAGNOSIS — Z7189 Other specified counseling: Secondary | ICD-10-CM

## 2023-09-03 DIAGNOSIS — Z604 Social exclusion and rejection: Secondary | ICD-10-CM

## 2023-09-03 DIAGNOSIS — R0609 Other forms of dyspnea: Secondary | ICD-10-CM

## 2023-09-03 DIAGNOSIS — J4489 Other specified chronic obstructive pulmonary disease: Secondary | ICD-10-CM

## 2023-09-03 DIAGNOSIS — Z5982 Transportation insecurity: Secondary | ICD-10-CM

## 2023-09-03 DIAGNOSIS — J449 Chronic obstructive pulmonary disease, unspecified: Secondary | ICD-10-CM

## 2023-09-03 DIAGNOSIS — Z0000 Encounter for general adult medical examination without abnormal findings: Secondary | ICD-10-CM | POA: Diagnosis not present

## 2023-09-03 DIAGNOSIS — Z789 Other specified health status: Secondary | ICD-10-CM

## 2023-09-03 DIAGNOSIS — E118 Type 2 diabetes mellitus with unspecified complications: Secondary | ICD-10-CM

## 2023-09-03 DIAGNOSIS — G4734 Idiopathic sleep related nonobstructive alveolar hypoventilation: Secondary | ICD-10-CM

## 2023-09-03 DIAGNOSIS — R918 Other nonspecific abnormal finding of lung field: Secondary | ICD-10-CM

## 2023-09-03 NOTE — Patient Instructions (Signed)
 Mr. Foot , Thank you for taking time out of your busy schedule to complete your Annual Wellness Visit with me. I enjoyed our conversation and look forward to speaking with you again next year. I, as well as your care team,  appreciate your ongoing commitment to your health goals. Please review the following plan we discussed and let me know if I can assist you in the future.  Your Game plan/ To Do List  Referrals/Orders: If you haven't heard from the office you've been referred to, please reach out to them at the phone provided.   A referral has been placed for you to check and see what additional resources are available to you.   If you haven't heard from anyone within the next 7 business days, please call them and let them know a referral has been placed for you Phone: [phone]  Lab Orders         Hemoglobin A1c    Have these labs completed at [facility] one week before your appointment with Dr. Antonetta on October 15, 2023 at 10:40 am   Follow up Visits: We will see or speak with you next year for your Next Medicare AWV with our clinical staff  Clinician Recommendations:  Aim for 30 minutes of exercise or brisk walking, 6-8 glasses of water, and 5 servings of fruits and vegetables each day.    Wishing you many blessings and good health during the next year until our next visit.  -Emery Binz   This is a list of the screenings recommended for you:  Health Maintenance  Topic Date Due   COVID-19 Vaccine (7 - 2024-25 season) 09/08/2022   Flu Shot  08/08/2023   Hemoglobin A1C  09/20/2023   Cologuard (Stool DNA test)  12/02/2023   Screening for Lung Cancer  02/27/2024   Yearly kidney health urinalysis for diabetes  03/19/2024   Yearly kidney function blood test for diabetes  05/29/2024   Medicare Annual Wellness Visit  09/02/2024   DTaP/Tdap/Td vaccine (3 - Td or Tdap) 02/28/2032   Pneumococcal Vaccine for age over 18  Completed   Hepatitis C Screening  Completed   Zoster  (Shingles) Vaccine  Completed   HPV Vaccine  Aged Out   Meningitis B Vaccine  Aged Out   Colon Cancer Screening  Discontinued    Advanced directives: (Declined) Advance directive discussed with you today. Even though you declined this today, please call our office should you change your mind, and we can give you the proper paperwork for you to fill out. Advance Care Planning is important because it:  [x]  Makes sure you receive the medical care that is consistent with your values, goals, and preferences  [x]  It provides guidance to your family and loved ones and reduces their decisional burden about whether or not they are making the right decisions based on your wishes.  Follow the link provided in your after visit summary or read over the paperwork we have mailed to you to help you started getting your Advance Directives in place. If you need assistance in completing these, please reach out to us  so that we can help you!  Information on Advanced Care Planning can be found at [place]  Secretary of [facility] Advance Health Care Directives Advance Health Care Directives ([email or web address]   See attachments for Preventive Care and Fall Prevention Tips.

## 2023-09-03 NOTE — Progress Notes (Signed)
 Subjective:   [name] is a 73 y.o. who presents for a Medicare Wellness preventive visit.  As a reminder, Annual Wellness Visits don't include a physical exam, and some assessments may be limited, especially if this visit is performed virtually. We may recommend an in-person follow-up visit with your provider if needed.  Visit Complete: Virtual I connected with  SIGMOND PATALANO on 09/03/23 by a audio enabled telemedicine application and verified that I am speaking with the correct person using two identifiers.  Patient Location: Home  Provider Location: Office/Clinic  I discussed the limitations of evaluation and management by telemedicine. The patient expressed understanding and agreed to proceed.  Vital Signs: Because this visit was a virtual/telehealth visit, some criteria may be missing or patient reported. Any vitals not documented were not able to be obtained and vitals that have been documented are patient reported.  VideoDeclined- This patient declined Librarian, academic. Therefore the visit was completed with audio only.  Persons Participating in Visit: Patient.  AWV Questionnaire: No: Patient Medicare AWV questionnaire was not completed prior to this visit.  Cardiac Risk Factors include: advanced age (>26men, >2 women);diabetes mellitus;male gender;sedentary lifestyle;hypertension;dyslipidemia;Other (see comment), Risk factor comments: COPD     Objective:    Today's Vitals   09/03/23 1046  Weight: 150 lb (68 kg)  Height: 5' 7 (1.702 m)  PainSc: 0-No pain   Body mass index is 23.49 kg/m.     09/03/2023   10:54 AM 03/01/2022    9:50 AM 12/17/2021    9:11 AM 05/18/2021    1:39 PM 05/04/2021    9:36 AM 04/09/2021    8:45 PM 04/05/2021    9:12 AM  Advanced Directives  Does Patient Have a Medical Advance Directive? No No No No No No No  Would patient like information on creating a medical advance directive? No - Patient declined No -  Patient declined No - Patient declined No - Patient declined Yes (ED - Information included in AVS) No - Patient declined No - Patient declined    Current Medications (verified) Outpatient Encounter Medications as of 09/03/2023  Medication Sig   albuterol  (PROVENTIL ) (2.5 MG/3ML) 0.083% nebulizer solution Take 3 mLs (2.5 mg total) by nebulization every 6 (six) hours as needed for wheezing or shortness of breath.   albuterol  (VENTOLIN  HFA) 108 (90 Base) MCG/ACT inhaler Inhale 1-2 puffs into the lungs every 6 (six) hours as needed for wheezing or shortness of breath.   amLODipine  (NORVASC ) 10 MG tablet Take 1 tablet (10 mg total) by mouth daily.   aspirin  EC 81 MG tablet Take 81 mg by mouth daily. Swallow whole.   budesonide -glycopyrrolate-formoterol  (BREZTRI  AEROSPHERE) 160-9-4.8 MCG/ACT AERO inhaler Take 2 puffs first thing in am and then another 2 puffs about 12 hours later.   levocetirizine (XYZAL ) 5 MG tablet Take 1 tablet (5 mg total) by mouth every evening.   metFORMIN  (GLUCOPHAGE ) 500 MG tablet Take 1 tablet (500 mg total) by mouth 2 (two) times daily with a meal.   montelukast  (SINGULAIR ) 10 MG tablet Take 1 tablet (10 mg total) by mouth at bedtime.   predniSONE  (DELTASONE ) 5 MG tablet TAKE 1 TABLET EVERY DAY WITH BREAKFAST   rosuvastatin  (CRESTOR ) 20 MG tablet Take 1 tablet (20 mg total) by mouth daily.   triamterene -hydrochlorothiazide  (MAXZIDE -25) 37.5-25 MG tablet TAKE 1 AND 1/2 TABLETS EVERY DAY.   [DISCONTINUED] simvastatin  (ZOCOR ) 40 MG tablet Take 40 mg by mouth daily.   No facility-administered  encounter medications on file as of 09/03/2023.    Allergies (verified) Patient has no known allergies.   History: Past Medical History:  Diagnosis Date   Asthma    CAP (community acquired pneumonia) 12/10/2018   COPD (chronic obstructive pulmonary disease) (HCC)    COPD with acute exacerbation (HCC) 04/18/2018   CVA (cerebral vascular accident) (HCC) 2008   with temporary vision  loss    Depression    ECHOCARDIOGRAM, ABNORMAL 03/06/2010   Qualifier: Diagnosis of  By: Jerilynn, NP, Kathryn     Elevated PSA 2014   no diagnosis pf prostate cancer in 07/2014   History of substance abuse (HCC) 01/14/2011   Marijuana   History of substance abuse (HCC) 01/14/2011   History of tobacco abuse 01/14/2011   Hyperlipidemia    Hypertension    Lobar pneumonia (HCC) 12/13/2018   Marijuana abuse    Nicotine addiction    Prediabetes 2014   Past Surgical History:  Procedure Laterality Date   COLONOSCOPY     RADIOACTIVE SEED IMPLANT N/A 04/09/2021   Procedure: RADIOACTIVE SEED IMPLANT/BRACHYTHERAPY IMPLANT/64 SEEDS;  Surgeon: Matilda Senior, MD;  Location: [facility];  Service: Urology;  Laterality: N/A;  90 MINS   SPACE OAR INSTILLATION N/A 04/09/2021   Procedure: SPACE OAR INSTILLATION;  Surgeon: Matilda Senior, MD;  Location: [facility];  Service: Urology;  Laterality: N/A;   History reviewed. No pertinent family history. Social History   Socioeconomic History   Marital status: Widowed    Spouse name: Not on file   Number of children: 1   Years of education: 84 the grade   Highest education level: 11th grade  Occupational History   Occupation: unemployed, retired  Tobacco Use   Smoking status: Former    Current packs/day: 0.00    Average packs/day: 0.5 packs/day for 40.0 years (20.0 ttl pk-yrs)    Types: Cigarettes    Start date: 04/14/1975    Quit date: 04/14/2015    Years since quitting: 8.3    Passive exposure: Past   Smokeless tobacco: Never  Vaping Use   Vaping status: Never Used  Substance and Sexual Activity   Alcohol use: Not Currently    Alcohol/week: 3.0 standard drinks of alcohol    Types: 3 Cans of beer per week    Comment: occasionally   Drug use: No    Comment: pt quit 20 years ago   Sexual activity: Not Currently    Partners: Female  Other Topics Concern   Not on file  Social History Narrative   Not on file   Social Drivers of Health   Financial  Resource Strain: Low Risk  (09/03/2023)   Overall Financial Resource Strain (CARDIA)    Difficulty of Paying Living Expenses: Not hard at all  Food Insecurity: No Food Insecurity (09/03/2023)   Hunger Vital Sign    Worried About Running Out of Food in the Last Year: Never true    Ran Out of Food in the Last Year: Never true  Transportation Needs: Unmet Transportation Needs (09/03/2023)   PRAPARE - Administrator, Civil Service (Medical): Yes    Lack of Transportation (Non-Medical): No  Physical Activity: Sufficiently Active (09/03/2023)   Exercise Vital Sign    Days of Exercise per Week: 7 days    Minutes of Exercise per Session: 30 min  Stress: No Stress Concern Present (09/03/2023)   [employer] of Occupational Health - Occupational Stress Questionnaire    Feeling of Stress: Not at all  Social Connections: Patient Declined (09/03/2023)   Social Connection and Isolation Panel    Frequency of Communication with Friends and Family: Patient declined    Frequency of Social Gatherings with Friends and Family: Patient declined    Attends Religious Services: Patient declined    Database administrator or Organizations: Patient declined    Attends Engineer, structural: Patient declined    Marital Status: Patient declined    Tobacco Counseling Counseling given: Yes    Clinical Intake:  Pre-visit preparation completed: Yes  Pain : No/denies pain Pain Score: 0-No pain     BMI - recorded: 23.49 Nutritional Status: BMI of 19-24  Normal Nutritional Risks: None Diabetes: Yes CBG done?: No (telehealth visit. unable to obtain cbg) Did pt. bring in CBG monitor from home?: No  Lab Results  Component Value Date   HGBA1C 6.4 (H) 03/20/2023   HGBA1C 6.7 (H) 08/26/2022   HGBA1C 6.4 (H) 02/19/2022     How often do you need to have someone help you when you read instructions, pamphlets, or other written materials from your doctor or pharmacy?: 4 -  Often  Interpreter Needed?: No  Information entered by :: Lurline Caver W CMA (AAMA)   Activities of Daily Living     09/03/2023   10:54 AM  In your present state of health, do you have any difficulty performing the following activities:  Hearing? 0  Vision? 0  Difficulty concentrating or making decisions? 0  Walking or climbing stairs? 0  Dressing or bathing? 0  Doing errands, shopping? 1  Comment doesn't drive  Preparing Food and eating ? N  Using the Toilet? N  In the past six months, have you accidently leaked urine? N  Do you have problems with loss of bowel control? N  Managing your Medications? N  Managing your Finances? N  Housekeeping or managing your Housekeeping? Y    Patient Care Team: Antonetta Rollene BRAVO, MD as PCP - General Rourk, Lamar HERO, MD as Consulting Physician (Gastroenterology) Moishe Chiquita HERO, NP as Nurse Practitioner (Family Medicine) Darlean Ozell NOVAK, MD as Consulting Physician (Pulmonary Disease) Vertell Pont, RN as Oncology Nurse Navigator Matilda Senior, MD as Consulting Physician (Urology) Patrcia Cough, MD as Consulting Physician (Radiation Oncology) Crawford Morna Pickle, NP as Nurse Practitioner (Hematology and Oncology) Dove, Natro D, RN as Registered Nurse  I have updated your Care Teams any recent Medical Services you may have received from other providers in the past year.     Assessment:   This is a routine wellness examination for Kailer.  Hearing/Vision screen Hearing Screening - Comments:: Patient denies any hearing difficulties.   Vision Screening - Comments:: Patient is not up to date on yearly eye exams.  He denies any difficulty seeing. He states he has no transportation and can't get to [facility] to see an eye doctor. Referral placed to assist patient with needs.    Goals Addressed               This Visit's Progress     Patient Stated   On track     To be living next year      Stay as healthy as possible  (pt-stated)        I want to stay as healthy as I can.        Depression Screen     09/03/2023   11:03 AM 05/30/2023   10:43 AM 03/20/2023    9:11 AM 03/01/2022    9:47  AM 02/21/2022   11:05 AM 12/17/2021    9:12 AM 12/17/2021    9:11 AM  PHQ 2/9 Scores  PHQ - 2 Score 0  0 0 0 0 0  PHQ- 9 Score 0        Exception Documentation  Other- indicate reason in comment box         Fall Risk     09/03/2023   10:58 AM 05/30/2023   10:42 AM 03/20/2023    9:10 AM 03/01/2022    9:28 AM 02/21/2022   11:05 AM  Fall Risk   Falls in the past year? 0 0 0 0 0  Number falls in past yr: 0  0 0 0  Injury with Fall? 0  0 0 0  Risk for fall due to : No Fall Risks  Impaired balance/gait No Fall Risks No Fall Risks  Follow up Falls evaluation completed;Education provided;Falls prevention discussed   Falls evaluation completed;Education provided;Falls prevention discussed Falls evaluation completed    MEDICARE RISK AT HOME:  Medicare Risk at Home Any stairs in or around the home?: Yes If so, are there any without handrails?: Yes Home free of loose throw rugs in walkways, pet beds, electrical cords, etc?: Yes Adequate lighting in your home to reduce risk of falls?: Yes Life alert?: No Use of a cane, walker or w/c?: No Grab bars in the bathroom?: No Shower chair or bench in shower?: No Elevated toilet seat or a handicapped toilet?: No  TIMED UP AND GO:  Was the test performed?  No  Cognitive Function: 6CIT completed    03/04/2016    3:28 PM  MMSE - Mini Mental State Exam  Orientation to time 2   Orientation to Place 5   Registration 3   Attention/ Calculation 0   Attention/Calculation-comments unable to spell   Recall 3   Language- name 2 objects 2   Language- repeat 1  Language- follow 3 step command 3   Language- read & follow direction 0   Language-read & follow direction-comments unable to read and write   Write a sentence 1   Copy design 1   Total score 21      Data saved with a  previous flowsheet row definition        09/03/2023   10:59 AM 12/17/2021    9:12 AM 12/07/2020    9:19 AM 12/01/2019    2:38 PM 11/30/2018   10:49 AM  6CIT Screen  What Year? 0 points 0 points 0 points 0 points 0 points  What month? 3 points 0 points 0 points 0 points 3 points  What time? 0 points 0 points 0 points 0 points 0 points  Count back from 20 0 points 0 points 0 points 0 points 0 points  Months in reverse 4 points 4 points 2 points 4 points 4 points  Repeat phrase 10 points 10 points 2 points 2 points 2 points  Total Score 17 points 14 points 4 points 6 points 9 points    Immunizations Immunization History  Administered Date(s) Administered   Fluad Quad(high Dose 65+) 08/31/2018, 10/15/2019, 09/12/2020, 10/25/2021   Influenza Split 10/23/2011   Influenza Whole 10/02/2010   Influenza,inj,Quad PF,6+ Mos 11/11/2012, 11/10/2013, 10/11/2014, 08/30/2015, 09/19/2016, 08/29/2017   Influenza-Unspecified 10/08/2022   Moderna Covid-19 Vaccine Bivalent Booster 55yrs & up 11/17/2020   Moderna SARS-COV2 Booster Vaccination 10/01/2019   Moderna Sars-Covid-2 Vaccination 03/03/2019, 03/31/2019, 12/14/2019, 07/07/2020   Pfizer(Comirnaty)Fall Seasonal Vaccine 12 years and older 03/07/2022  Pneumococcal Conjugate-13 03/28/2014   Pneumococcal Polysaccharide-23 05/30/2010, 06/19/2015   Td 09/22/2003   Tdap 02/27/2022   Zoster Recombinant(Shingrix) 11/20/2018, 05/11/2019    Screening Tests Health Maintenance  Topic Date Due   COVID-19 Vaccine (7 - 2024-25 season) 09/08/2022   INFLUENZA VACCINE  08/08/2023   HEMOGLOBIN A1C  09/20/2023   Fecal DNA (Cologuard)  12/02/2023   Lung Cancer Screening  02/27/2024   Diabetic kidney evaluation - Urine ACR  03/19/2024   Diabetic kidney evaluation - eGFR measurement  05/29/2024   Medicare Annual Wellness (AWV)  09/02/2024   DTaP/Tdap/Td (3 - Td or Tdap) 02/28/2032   Pneumococcal Vaccine: 50+ Years  Completed   Hepatitis C Screening   Completed   Zoster Vaccines- Shingrix  Completed   HPV VACCINES  Aged Out   Meningococcal B Vaccine  Aged Out   Colonoscopy  Discontinued    Health Maintenance  Health Maintenance Due  Topic Date Due   COVID-19 Vaccine (7 - 2024-25 season) 09/08/2022   INFLUENZA VACCINE  08/08/2023   Health Maintenance Items Addressed: Labs Ordered: A1C  Additional Screening:  Vision Screening: Recommended annual ophthalmology exams for early detection of glaucoma and other disorders of the eye. Would you like a referral to an eye doctor? No    Dental Screening: Recommended annual dental exams for proper oral hygiene  Community Resource Referral / Chronic Care Management: CRR required this visit?  Yes   CCM required this visit?  No   Plan:    I have personally reviewed and noted the following in the patient's chart:   Medical and social history Use of alcohol, tobacco or illicit drugs  Current medications and supplements including opioid prescriptions. Patient is not currently taking opioid prescriptions. Functional ability and status Nutritional status Physical activity Advanced directives List of other physicians Hospitalizations, surgeries, and ER visits in previous 12 months Vitals Screenings to include cognitive, depression, and falls Referrals and appointments  In addition, I have reviewed and discussed with patient certain preventive protocols, quality metrics, and best practice recommendations. A written personalized care plan for preventive services as well as general preventive health recommendations were provided to patient.   Ninoshka Wainwright, CMA   09/03/2023   After Visit Summary: (Mail) Due to this being a telephonic visit, the after visit summary with patients personalized plan was offered to patient via mail   Notes: Please refer to Routing Comments.

## 2023-09-04 ENCOUNTER — Other Ambulatory Visit: Payer: Self-pay | Admitting: Pulmonary Disease

## 2023-09-12 ENCOUNTER — Other Ambulatory Visit: Payer: Self-pay

## 2023-09-12 NOTE — Patient Outreach (Signed)
 Complex Care Management   Visit Note  09/12/2023  Name:  [name] [name]: [id number] DOB: 1950/10/28  Situation: Referral received for Complex Care Management related to SDOH Barriers:  Transportation Personal Care I obtained verbal consent from Patient.  Visit completed with Patient  on the phone  Background:   Past Medical History:  Diagnosis Date   Asthma    CAP (community acquired pneumonia) 12/10/2018   COPD (chronic obstructive pulmonary disease) (HCC)    COPD with acute exacerbation (HCC) 04/18/2018   CVA (cerebral vascular accident) (HCC) 2008   with temporary vision loss    Depression    ECHOCARDIOGRAM, ABNORMAL 03/06/2010   Qualifier: Diagnosis of  By: Jerilynn, NP, Lamarr     Elevated PSA 2014   no diagnosis pf prostate cancer in 07/2014   History of substance abuse (HCC) 01/14/2011   Marijuana   History of substance abuse (HCC) 01/14/2011   History of tobacco abuse 01/14/2011   Hyperlipidemia    Hypertension    Lobar pneumonia (HCC) 12/13/2018   Marijuana abuse    Nicotine addiction    Prediabetes 2014    Assessment:  Patient reports he does not need transportation assistance as he drives his moped to the doctor and grocery store. The drive is very close and is not an issue. Patient is able to clean his home, but not very well due to shortness of breath. Patient declines contacting insurance for transportation options. Patient is not able to pay out of pocket for cleaning services.   SDOH Interventions    Flowsheet Row Patient Outreach Telephone from 09/12/2023 in [facility] Clinical Support from 09/03/2023 in [facility] Primary Care Care Coordination from 03/01/2022 in [facility] Community Care Coordination Clinical Support from 12/17/2021 in [facility] Primary Care Chronic Care Management from 06/15/2020 in [facility] Primary Care Chronic Care Management from 02/24/2020 in [facility]  Primary Care  SDOH Interventions        Food Insecurity Interventions Intervention Not Indicated  [No issues with food] Intervention Not Indicated Intervention Not Indicated Intervention Not Indicated Intervention Not Indicated Intervention Not Indicated  Housing Interventions Intervention Not Indicated Intervention Not Indicated Intervention Not Indicated Intervention Not Indicated Intervention Not Indicated Intervention Not Indicated  Transportation Interventions Intervention Not Indicated  [Drives his scooter, provider short distance from home] AMB Referral Intervention Not Indicated, Patient Resources (Friends/Family), Payor Benefit Intervention Not Indicated -- Intervention Not Indicated  Utilities Interventions Intervention Not Indicated Intervention Not Indicated -- Intervention Not Indicated -- --  Alcohol Usage Interventions -- Intervention Not Indicated (Score <7) Intervention Not Indicated (Score <7) Intervention Not Indicated (Score <7) -- --  Depression Interventions/Treatment  -- PHQ2-9 Score <4 Follow-up Not Indicated -- -- -- --  Financial Strain Interventions Intervention Not Indicated Intervention Not Indicated Intervention Not Indicated Intervention Not Indicated -- Intervention Not Indicated  Physical Activity Interventions -- Intervention Not Indicated Intervention Not Indicated Intervention Not Indicated Intervention Not Indicated Intervention Not Indicated  Stress Interventions -- Intervention Not Indicated Intervention Not Indicated Intervention Not Indicated -- Intervention Not Indicated  Social Connections Interventions -- Patient Declined Intervention Not Indicated Intervention Not Indicated -- Intervention Not Indicated  Health Literacy Interventions -- Intervention Not Indicated -- -- -- --      Recommendation:   None  Follow Up Plan:   Patient has met all care management goals. Care Management case will be closed. Patient has been provided contact information should  new needs  arise.   Tillman Gardener, BSW [place]  [facility], [facility] Social Worker Direct Dial: [phone]  Fax: [phone] Website: [email or web address]

## 2023-09-12 NOTE — Patient Instructions (Signed)
 Visit Information  Thank you for taking time to visit with me today. Please don't hesitate to contact me if I can be of assistance to you before our next scheduled appointment.    Following is a copy of your care plan:   Goals Addressed             This Visit's Progress    BSW VBCI Social Work Care Plan       Problems:   Transportation and Personal care  CSW Clinical Goal(s):   Over the next 0 days the Patient will use moped to get to appointments and run errands.  Interventions:  Social Determinants of Health in Patient with Asthma, COPD, DMII, and HTN: SDOH assessments completed: Transportation and Personal Care Evaluation of current treatment plan related to unmet needs Patient reports he does not need transportation assistance as he drives his moped to the doctor and grocery store.  The drive is very close and is not an issue.  Patient is able to clean his home, but not very well due to shortness of breath.  Patient declines contacting insurance for transportation options.  Patient is not able to pay out of pocket for cleaning services.  Patient Goals/Self-Care Activities:  Patient will continue to use moped for transportation and clean his home independently.    Plan:   No further follow up required: Patient does not request a follow up visit.        Please call 911 if you are experiencing a Mental Health or Behavioral Health Crisis or need someone to talk to.  Patient verbalizes understanding of instructions and care plan provided today and agrees to view in MyChart. Active MyChart status and patient understanding of how to access instructions and care plan via MyChart confirmed with patient.     Tillman Gardener, BSW [place]  [facility], [facility] Social Worker Direct Dial: [phone]  Fax: [phone] Website: [email or web address]

## 2023-09-17 ENCOUNTER — Telehealth: Payer: Self-pay | Admitting: Internal Medicine

## 2023-09-17 ENCOUNTER — Other Ambulatory Visit: Payer: Self-pay | Admitting: Family Medicine

## 2023-09-17 NOTE — Telephone Encounter (Signed)
 Spoke with patient regarding the 10/29/23 8:30 am appointment with DR. Wert--provider is not in the office and appointment is rescheduled to Tuesday 10/28/23 at 8:45am.  Will mail information to patient and he voiced his understanding

## 2023-09-29 ENCOUNTER — Telehealth: Admitting: *Deleted

## 2023-10-06 ENCOUNTER — Ambulatory Visit (HOSPITAL_COMMUNITY): Admission: RE | Admit: 2023-10-06 | Source: Ambulatory Visit

## 2023-10-15 ENCOUNTER — Ambulatory Visit: Admitting: Family Medicine

## 2023-10-16 ENCOUNTER — Encounter: Payer: Self-pay | Admitting: *Deleted

## 2023-10-16 ENCOUNTER — Telehealth: Payer: Self-pay | Admitting: *Deleted

## 2023-10-16 NOTE — Patient Instructions (Signed)
 [name] - I am sorry I was unable to reach you today for our scheduled appointment. I work with Antonetta Rollene BRAVO, MD and am calling to support your healthcare needs. Please contact me at [phone] at your earliest convenience. I look forward to speaking with you soon.   Thank you,  [name], BSN RN [facility], [facility] RN Care Manager Direct Dial: [phone]  Fax: [phone]

## 2023-10-19 NOTE — Progress Notes (Incomplete)
 Impression/Assessment:  Grade group 2 prostate cancer, 2 years out from brachytherapy, doing quite well  Plan:   History of Present Illness: Here for follow-up of prostate cancer-treated.  9.14.2022: TRUS/Bx. PSA 9.5, prostate volume 33.6 mL, PSAD 0.28.   1/12 cores (RT base lateral) revealed GG 2 pattern in 40 % of core.  He comes with his friend, Dickey Fickle.  He denies significant lower urinary tract symptoms.  Kattan nomogram predictions  Extracapsular disease-59% Seminal vesicle/lymph node involvement-3% each 5/10-year progression free survival with radical prostatectomy-81/69%  4.3.2023: He underwent I-125 brachytherapy and placement of SpaceOAR.  10.14.2025:  Past Medical History:  Diagnosis Date   Asthma    CAP (community acquired pneumonia) 12/10/2018   COPD (chronic obstructive pulmonary disease) (HCC)    COPD with acute exacerbation (HCC) 04/18/2018   CVA (cerebral vascular accident) (HCC) 2008   with temporary vision loss    Depression    ECHOCARDIOGRAM, ABNORMAL 03/06/2010   Qualifier: Diagnosis of  By: Jerilynn, NP, Kathryn     Elevated PSA 2014   no diagnosis pf prostate cancer in 07/2014   History of substance abuse (HCC) 01/14/2011   Marijuana   History of substance abuse (HCC) 01/14/2011   History of tobacco abuse 01/14/2011   Hyperlipidemia    Hypertension    Lobar pneumonia 12/13/2018   Marijuana abuse    Nicotine addiction    Prediabetes 2014    Past Surgical History:  Procedure Laterality Date   COLONOSCOPY     RADIOACTIVE SEED IMPLANT N/A 04/09/2021   Procedure: RADIOACTIVE SEED IMPLANT/BRACHYTHERAPY IMPLANT/64 SEEDS;  Surgeon: Matilda Senior, MD;  Location: [facility];  Service: Urology;  Laterality: N/A;  90 MINS   SPACE OAR INSTILLATION N/A 04/09/2021   Procedure: SPACE OAR INSTILLATION;  Surgeon: Matilda Senior, MD;  Location: [facility];  Service: Urology;  Laterality: N/A;    Home Medications:  Allergies as of 10/21/2023   No Known  Allergies      Medication List        Accurate as of October 19, 2023 10:08 AM. If you have any questions, ask your nurse or doctor.          albuterol  (2.5 MG/3ML) 0.083% nebulizer solution Commonly known as: PROVENTIL  Take 3 mLs (2.5 mg total) by nebulization every 6 (six) hours as needed for wheezing or shortness of breath.   albuterol  108 (90 Base) MCG/ACT inhaler Commonly known as: VENTOLIN  HFA Inhale 1-2 puffs into the lungs every 6 (six) hours as needed for wheezing or shortness of breath.   amLODipine  10 MG tablet Commonly known as: NORVASC  Take 1 tablet (10 mg total) by mouth daily.   aspirin  EC 81 MG tablet Take 81 mg by mouth daily. Swallow whole.   Breztri  Aerosphere 160-9-4.8 MCG/ACT Aero inhaler Generic drug: budesonide -glycopyrrolate-formoterol  Take 2 puffs first thing in am and then another 2 puffs about 12 hours later.   levocetirizine 5 MG tablet Commonly known as: XYZAL  Take 1 tablet (5 mg total) by mouth every evening.   metFORMIN  500 MG tablet Commonly known as: GLUCOPHAGE  Take 1 tablet (500 mg total) by mouth 2 (two) times daily with a meal.   montelukast  10 MG tablet Commonly known as: SINGULAIR  Take 1 tablet (10 mg total) by mouth at bedtime.   predniSONE  5 MG tablet Commonly known as: DELTASONE  TAKE 1 TABLET EVERY DAY WITH BREAKFAST   rosuvastatin  20 MG tablet Commonly known as: CRESTOR  Take 1 tablet (20 mg total) by mouth daily.  triamterene -hydrochlorothiazide  37.5-25 MG tablet Commonly known as: MAXZIDE -25 TAKE 1 AND 1/2 TABLETS EVERY DAY.        Allergies: No Known Allergies  No family history on file.  Social History:  reports that he quit smoking about 8 years ago. His smoking use included cigarettes. He started smoking about 48 years ago. He has a 20 pack-year smoking history. He has been exposed to tobacco smoke. He has never used smokeless tobacco. He reports that he does not currently use alcohol after a past usage  of about 3.0 standard drinks of alcohol per week. He reports that he does not use drugs.  ROS: A complete review of systems was performed.  All systems are negative except for pertinent findings as noted.  Physical Exam:  Vital signs in last 24 hours: There were no vitals taken for this visit. Constitutional:  Alert and oriented, No acute distress.  Quite thin. Cardiovascular: Regular rate  Respiratory: Normal respiratory effort Neurologic: Grossly intact, no focal deficits Psychiatric: Normal mood and affect  I have reviewed prior pt notes  I have reviewed urinalysis results  I have independently reviewed prior imaging--prior prostate U/S  I have reviewed prior PSA and pathology results

## 2023-10-21 ENCOUNTER — Ambulatory Visit: Admitting: Urology

## 2023-10-21 DIAGNOSIS — Z8546 Personal history of malignant neoplasm of prostate: Secondary | ICD-10-CM

## 2023-10-28 ENCOUNTER — Ambulatory Visit: Admitting: Urology

## 2023-10-28 ENCOUNTER — Encounter: Payer: Self-pay | Admitting: Internal Medicine

## 2023-10-28 ENCOUNTER — Ambulatory Visit: Admitting: Internal Medicine

## 2023-10-28 VITALS — BP 137/74 | HR 103 | Ht 67.0 in | Wt 114.8 lb

## 2023-10-28 DIAGNOSIS — G4734 Idiopathic sleep related nonobstructive alveolar hypoventilation: Secondary | ICD-10-CM | POA: Diagnosis not present

## 2023-10-28 DIAGNOSIS — Z87891 Personal history of nicotine dependence: Secondary | ICD-10-CM

## 2023-10-28 DIAGNOSIS — J310 Chronic rhinitis: Secondary | ICD-10-CM | POA: Diagnosis not present

## 2023-10-28 DIAGNOSIS — J449 Chronic obstructive pulmonary disease, unspecified: Secondary | ICD-10-CM | POA: Diagnosis not present

## 2023-10-28 MED ORDER — PREDNISONE 5 MG PO TABS: ORAL_TABLET | ORAL | 3 refills | Status: AC

## 2023-10-28 NOTE — Assessment & Plan Note (Addendum)
 Quit smoking 2017  - 05/11/2019  After extensive coaching inhaler device,  effectiveness =    90% elipta so rec change to trelegy  - 05/11/2019   Walked on RA   300 ft -@ avg pace stopped due to sob with sats at end  93%  - 05/28/2019  After extensive coaching inhaler device,  effectiveness =    75% changed to breztri  trial x 2 week samples -  05/28/2019   Walked RA  approx   300 ft  @ moderately fast pace  stopped due to  Sob/ sats still 90%   - 07/28/2019  After extensive coaching inhaler device,  effectiveness =    75% (sort ti) >continue breztri  2bid PFT's  07/29/19  FEV1 0.63 (24 % ) ratio 0.32  p 0 % improvement from saba p ? prior to study with DLCO  14.78 (63%) corrects to 5.03 (123%)  for alv volume and FV curve classic curvature  - 08/03/2019   continue breztri    - 08/03/19  Prednisone  as maint 20 ceiling and 10 mg floor  -  08/03/2019   Walked RA  approx   300 ft  @ moderate pace  stopped due to  End of study no sob and sats 96% at end  -  05/09/2023  After extensive coaching inhaler device,  effectiveness =    90% with dpi/elipta and 75% with hfa (short ti) > continue trelegy 100 and prn saba - 08/08/2023   change back to breztri  as he feels trelegy making him cough  - 10/28/2023  After extensive coaching inhaler device,  effectiveness =    85% hfa (short Ti)    Group E in terms of symptoms/risk so  laba/lama/ICS  therefore appropriate rx at this point >>> predniosne 2.5 mg daily (physiologic adrenal support and  breztri  Take 2 puffs first thing in am and then another 2 puffs about 12 hours later and approp SABA prn.    Re SABA :  I spent extra time with pt today reviewing appropriate use of albuterol  for prn use on exertion with the following points: 1) saba is for relief of sob that does not improve by walking a slower pace or resting but rather if the pt does not improve after trying this first. 2) If the pt is convinced, as many are, that saba helps recover from activity faster then it's easy to  tell if this is the case by re-challenging : ie stop, take the inhaler, then p 5 minutes try the exact same activity (intensity of workload) that just caused the symptoms and see if they are substantially diminished or not after saba 3) if there is an activity that reproducibly causes the symptoms, try the saba 15 min before the activity on alternate days   If in fact the saba really does help, then fine to continue to use it prn but advised may need to look closer at the maintenance regimen being used to achieve better control of airways disease with exertion.

## 2023-10-28 NOTE — Assessment & Plan Note (Addendum)
 As of 05/09/2023 on 3lpm hs  - 05/09/2023   Walked on RA  x  3  lap(s) =  approx 450  ft  @ mod pace, stopped due to end of study  with lowest 02 sats 92% and no sob  - added water to concentrator due to nasal congestion 05/09/2023 >>>  too much nasal drainage so stopped it as of 08/08/2023  - 08/08/2023   Walked on RA  x  2   lap(s) =  approx 450   ft  @ slow/mod pace, stopped due to  doe with lowest 02 sats 94%   - 10/28/2023   Walked on RA  x  3  lap(s) =  approx 450  ft  @ mod pace, stopped due to end of study  with lowest 02 sats 91% and mild sob at end    Continue 3lpm hs and none needed with ambulation for now

## 2023-10-28 NOTE — Patient Instructions (Addendum)
 Breztri  is  Take 2 puffs first thing in am and then another 2 puffs about 12 hours later.    Work on inhaler technique:  relax and gently blow all the way out then take a nice smooth full deep breath back in, triggering the inhaler at same time you start breathing in.  Hold breath in for at least  5 seconds if you can. Blow out breztri  thru nose. Rinse and gargle with water when done.  If mouth or throat bother you at all,  try brushing teeth/gums/tongue with arm and hammer toothpaste/ make a slurry and gargle and spit out.   Use your albuterol  as a rescue medication to be used if you can't catch your breath by resting, slowing your pace,  or doing a relaxed purse lip breathing pattern.  - The less you use it, the better it will work when you need it. - Ok to use up to 2 puffs  every 4 hours if you must but call for  appointment if use goes up over your usual need - Don't leave home without it !!  (think of it like a spare tire or starter fluid for your car)   Also  Ok to try albuterol  15 min before an activity (on alternating days)  that you know would usually make you short of breath and see if it makes any difference and if makes none then don't take albuterol  after activity unless you can't catch your breath as this means it's the resting that helps, not the albuterol .       We weill schedule CT CHEST [facility] NODULE FOLLOW-UP W/O CM)   Please schedule a follow up visit in 6  months but call sooner if needed  with all medications /inhalers/ solutions in hand so we can verify exactly what you are taking. This includes all medications from all doctors and over the counters ;

## 2023-10-28 NOTE — Progress Notes (Signed)
 Ubaldo JONETTA Barrack, male    DOB: [birth year],  [name]: [id number]   Brief patient profile:  73 yobm quit smoking 2017  illiterate   grew up with dx of asthma but only started needing in saba in HS when started smoking and eventually placed on advair  500 and followed by Dr Vonzell and referred to pulmonary clinic in [facility]  05/11/2019 by Chiquita Barefoot p Dr Vonzell retired     History of Present Illness  05/11/2019  Pulmonary/ 1st office eval/Trula Frede on advair  500  Chief Complaint  Patient presents with   Pulmonary Consult    Referred by Chiquita Barefoot, NP. Former patient of Dr Vonzell. He states his breathing has been worse since he had first covid vaccine 03/03/19. He gets SOB when he wakes up in the am and starts moving around. He has cough with cream colored sputum. He is using his albuterol  inhaler about every 6 hours and duonebs 2 x daily.   Dyspnea:  Can't walk a block = MMRC3 = can't walk 100 yards even at a slow pace at a flat grade s stopping due to sob   Cough: min white mucus  Sleep: bed is flat 2 pillows  SABA use: way too much as above 02 :  Only uses 02 p sits down  rec Stop wixela, atrovent  (ipatropium solution) and combivent   Plan A = Automatic = Always=    Trelegy one click each am - take two good drags Plan B = Backup (to supplement plan A, not to replace it) Only use your albuterol  inhaler (PROAIR )  as a rescue medication   Plan C = Crisis (instead of Plan B but only if Plan B stops working) - only use your albuterol  nebulizer if you first try Plan B and it fails to help > ok to use the nebulizer up to every 4 hours but if start needing it regularly call for immediate appointment  Prednisone  10 mg take  4 each am x 2 days,   2 each am x 2 days,  1 each am x 2 days and stop  Make sure you check your oxygen  saturations at highest level of activity (not after you stop!) to be sure it stays over 90% and adjust upward to maintain this level if needed but remember to turn it back to  previous settings when you stop (to conserve your supply).     CT chest 07/05/19 >> mild centrilobular and paraseptal emphysema  PFT 07/29/19 >> FEV1 0.63 (24%),  Ratio 0.32, TLC 9.24 (143%), DLCO 63%      11/25/2019  f/u ov/[place] office/Zuriel Yeaman re: chest tight  Chief Complaint  Patient presents with   Follow-up    feel tight in chest since took breztri  this morning   Dyspnea:  Worse today so took 2 prednisone , has been on breztri  2bid but says got worse after am dose with chest tightness generailized, did not think to try his neb, rode moped instead to office and requested to be seen  Cough: dry  Sleeping: ok  SABA use: about twice daily / neb not today/ very limited insight into how/ when to use meds  02: prn  Rec No change in medications/ instructions    [place]ood eval 10/07/22 COPD/bronchiectasis exacerbation. - will give him a course of cefuroxime    Severe COPD with emphysema. - previously seen by Dr. Vonzell and Dr. Darlean - has been prednisone  dependent since July 2021 - don't think he needs an increase in prednisone  at this  time - continue trelegy 100 one puff daily, prednisone  2.5 mg daily, singulair  10 mg nightly - prn albuterol  - he has a nebulizer   Bronchiectasis. - likely from prior episodes of pneumonia - prn mucinex , flutter valve   Chronic hypoxic/hypercapnic respiratory failure. - 3 liters with exertion and sleep - uses [employer] for his DME - discussed importance of maintaining compliance with supplemental oxygen    Lung nodule. - he will need follow up low dose CT chest in January 2025   Social determinants of health. - he has limited reading ability which impacts his ability to follow through with medical instructions   05/09/2023  Re-establish  ov/[place] office/Azaan Leask re: GOLD 4 COPD on prn 02  maint on trelegy / pred 5 mg half daily /  did  bring meds / uses trelegy at hs s noct symptoms historically  Chief Complaint  Patient presents with   COPD    Shortness of Breath   Cough  Dyspnea:  able to do [employer]/ grocery shopping good pace  Cough: no am flares - says  nasal congestion from nasal 02 / some green mucus from nose and throat  Sleeping: bed is flat/ 2 pillows    resp cc  SABA use: hfa once a day / neb once a week  02: 3lpm hs  Lung cancer screening: q feb  Rec We will ask your DME company to humidify your oxygen   Plan A = Automatic = Always=  Trelegy 100  one click each am  Plan B = Backup (to supplement plan A, not to replace it) Only use your albuterol  inhaler as a rescue  Plan C = Crisis (instead of Plan B but only if Plan B stops working) - only use your albuterol  nebulizer if you first try Plan B  Augmentin  875 mg take one pill twice daily  X 10 days -        08/08/2023 3 m f/u ov/[place] office/Carmisha Larusso re: GOLD 4 COPD on prn 02   maint on trelegy 100 , singulair  and prednisone  5 mg per Dr Antonetta with persistent  nasal congestion x ever since covid (he gestures this was related to nasal testing, not the infection or the vaccine to his knowledge  Chief Complaint  Patient presents with   Follow-up   COPD  Dyspnea:  yardwork / lifting can still walk at [employer] same pace as others / mb and back is 50 ft slt uphill does fine  Cough: assoc with use of trelegy  = dry / worse p trelegy q am  Sleeping: flat bed  / one pillow s resp cc  SABA use: sev times per day usually p exertion  02: 3lpm hs none dayime  Lung cancer screening: overdue for f/u (Feb scan was supposed to be repeated at 6 m)  Rec My office will be contacting you by phone for referral to CT CHEST LCS NODULE FOLLOW-UP W/O CM)  Change Trelegy 100 to breztri  Take 2 puffs first thing in am and then another 2 puffs about 12 hours later.  Work on inhaler technique:     Please schedule a follow up visit in 3 months but call sooner if needed   Allergy screen 08/08/23 >  Eos 0. 3/  IgE  47   10/28/2023  f/u ov/[place] office/Kairo Laubacher re: GOLD 4 COPD on prn 02   maint on breztri   and prednisone  5 mg  one half  Chief Complaint  Patient presents with   Shortness of Breath  Doe in the a.m  Coughing green mucus    Dyspnea:  no change doe on breztri  12 and 12 (misunderstood the 12 hour rule  Cough: green mucus since started breztri  minimal production of use  Sleeping: flat bed one pillow s  noct resp cc  SABA use: twice daily after ex  02: 3lpm noct   Lung cancer screening: try again  10/28/2023    No obvious day to day or daytime variability or assoc   mucus plugs or hemoptysis or cp or chest tightness, subjective wheeze or overt  hb symptoms.    Also denies any obvious fluctuation of symptoms with weather or environmental changes or other aggravating or alleviating factors except as outlined above   No unusual exposure hx or h/o childhood pna  or knowledge of premature birth.  Current Allergies, Complete Past Medical History, Past Surgical History, Family History, and Social History were reviewed in [employer] record.  ROS  The following are not active complaints unless bolded Hoarseness, sore throat, dysphagia, dental problems, itching, sneezing,  nasal congestion or discharge of excess mucus or purulent secretions, ear ache,   fever, chills, sweats, unintended wt loss or wt gain, classically pleuritic or exertional cp,  orthopnea pnd or arm/hand swelling  or leg swelling, presyncope, palpitations, abdominal pain, anorexia, nausea, vomiting, diarrhea  or change in bowel habits or change in bladder habits, change in stools or change in urine, dysuria, hematuria,  rash, arthralgias, visual complaints, headache, numbness, weakness or ataxia or problems with walking or coordination,  change in mood or  memory.        Current Meds  Medication Sig   albuterol  (PROVENTIL ) (2.5 MG/3ML) 0.083% nebulizer solution Take 3 mLs (2.5 mg total) by nebulization every 6 (six) hours as needed for wheezing or shortness of breath.    albuterol  (VENTOLIN  HFA) 108 (90 Base) MCG/ACT inhaler Inhale 1-2 puffs into the lungs every 6 (six) hours as needed for wheezing or shortness of breath.   amLODipine  (NORVASC ) 10 MG tablet Take 1 tablet (10 mg total) by mouth daily.   aspirin  EC 81 MG tablet Take 81 mg by mouth daily. Swallow whole.   budesonide -glycopyrrolate-formoterol  (BREZTRI  AEROSPHERE) 160-9-4.8 MCG/ACT AERO inhaler Take 2 puffs first thing in am and then another 2 puffs about 12 hours later.   levocetirizine (XYZAL ) 5 MG tablet Take 1 tablet (5 mg total) by mouth every evening.   metFORMIN  (GLUCOPHAGE ) 500 MG tablet Take 1 tablet (500 mg total) by mouth 2 (two) times daily with a meal.   montelukast  (SINGULAIR ) 10 MG tablet Take 1 tablet (10 mg total) by mouth at bedtime.   rosuvastatin  (CRESTOR ) 20 MG tablet Take 1 tablet (20 mg total) by mouth daily.   triamterene -hydrochlorothiazide  (MAXZIDE -25) 37.5-25 MG tablet TAKE 1 AND 1/2 TABLETS EVERY DAY.   [DISCONTINUED] predniSONE  (DELTASONE ) 5 MG tablet TAKE 1 TABLET EVERY DAY WITH BREAKFAST               Past Medical History:  Diagnosis Date   Asthma    CAP (community acquired pneumonia) 12/10/2018   COPD (chronic obstructive pulmonary disease) (HCC)    COPD with acute exacerbation (HCC) 04/18/2018   CVA (cerebral vascular accident) (HCC) 2008   with temporary vision loss    Depression    ECHOCARDIOGRAM, ABNORMAL 03/06/2010   Qualifier: Diagnosis of  By: Jerilynn, NP, Kathryn     Elevated PSA 2014   no diagnosis pf prostate cancer in 07/2014   History  of substance abuse (HCC) 01/14/2011   Marijuana   History of substance abuse (HCC) 01/14/2011   History of tobacco abuse 01/14/2011   Hyperlipidemia    Hypertension    Lobar pneumonia (HCC) 12/13/2018   Marijuana abuse    Nicotine addiction    Prediabetes 2014      Objective:    Wts 10/28/2023        \\114 08/08/2023          112  05/09/2023          111  11/25/2019      124 10/15/2019        126  08/20/2019         130  08/03/2019        122  07/28/2019        121  07/09/2019          122   06/10/2019         122   05/28/19 118 lb 8 oz (53.8 kg)  05/11/19 122 lb (55.3 kg)  04/16/19 122 lb 12.8 oz (55.7 kg)    Vital signs reviewed  10/28/2023  - Note at rest 02 sats  94% on RA   General appearance:    thin amb bm easily confused with details of care   HEENT :  Oropharynx  clear  edentulous      NECK :  without JVD/Nodes/TM/ nl carotid upstrokes bilaterally   LUNGS: no acc muscle use,  Mod barrel  contour chest wall with bilateral  Distant bs s audible wheeze and  without cough on insp or exp maneuvers and mod  Hyperresonant  to  percussion bilaterally     CV:  RRR  no s3 or murmur or increase in P2, and no edema   ABD:  soft and nontender    MS:   Ext warm without deformities or   obvious joint restrictions , calf tenderness, cyanosis or clubbing  SKIN: warm and dry without lesions    NEURO:  alert, approp, nl sensorium with  no motor or cerebellar deficits apparent.                    Assessment   Assessment & Plan COPD GOLD IV/ group D  Quit smoking 2017  - 05/11/2019  After extensive coaching inhaler device,  effectiveness =    90% elipta so rec change to trelegy  - 05/11/2019   Walked on RA   300 ft -@ avg pace stopped due to sob with sats at end  93%  - 05/28/2019  After extensive coaching inhaler device,  effectiveness =    75% changed to breztri  trial x 2 week samples -  05/28/2019   Walked RA  approx   300 ft  @ moderately fast pace  stopped due to  Sob/ sats still 90%   - 07/28/2019  After extensive coaching inhaler device,  effectiveness =    75% (sort ti) >continue breztri  2bid PFT's  07/29/19  FEV1 0.63 (24 % ) ratio 0.32  p 0 % improvement from saba p ? prior to study with DLCO  14.78 (63%) corrects to 5.03 (123%)  for alv volume and FV curve classic curvature  - 08/03/2019   continue breztri    - 08/03/19  Prednisone  as maint 20 ceiling and 10 mg floor  -  08/03/2019   Walked RA   approx   300 ft  @ moderate pace  stopped due to  End  of study no sob and sats 96% at end  -  05/09/2023  After extensive coaching inhaler device,  effectiveness =    90% with dpi/elipta and 75% with hfa (short ti) > continue trelegy 100 and prn saba - 08/08/2023   change back to breztri  as he feels trelegy making him cough  - 10/28/2023  After extensive coaching inhaler device,  effectiveness =    85% hfa (short Ti)    Group E in terms of symptoms/risk so  laba/lama/ICS  therefore appropriate rx at this point >>> predniosne 2.5 mg daily (physiologic adrenal support and  breztri  Take 2 puffs first thing in am and then another 2 puffs about 12 hours later and approp SABA prn.    Re SABA :  I spent extra time with pt today reviewing appropriate use of albuterol  for prn use on exertion with the following points: 1) saba is for relief of sob that does not improve by walking a slower pace or resting but rather if the pt does not improve after trying this first. 2) If the pt is convinced, as many are, that saba helps recover from activity faster then it's easy to tell if this is the case by re-challenging : ie stop, take the inhaler, then p 5 minutes try the exact same activity (intensity of workload) that just caused the symptoms and see if they are substantially diminished or not after saba 3) if there is an activity that reproducibly causes the symptoms, try the saba 15 min before the activity on alternate days   If in fact the saba really does help, then fine to continue to use it prn but advised may need to look closer at the maintenance regimen being used to achieve better control of airways disease with exertion.    Nocturnal hypoxemia As of 05/09/2023 on 3lpm hs  - 05/09/2023   Walked on RA  x  3  lap(s) =  approx 450  ft  @ mod pace, stopped due to end of study  with lowest 02 sats 92% and no sob  - added water to concentrator due to nasal congestion 05/09/2023 >>>  too much nasal drainage so stopped  it as of 08/08/2023  - 08/08/2023   Walked on RA  x  2   lap(s) =  approx 450   ft  @ slow/mod pace, stopped due to  doe with lowest 02 sats 94%   - 10/28/2023   Walked on RA  x  3  lap(s) =  approx 450  ft  @ mod pace, stopped due to end of study  with lowest 02 sats 91% and mild sob at end    Continue 3lpm hs and none needed with ambulation for now   Former cigarette smoker Quit smoking 2017 so eligible for LSDSCT thru age 1  - LDSCT  2/20225 new nodule > repeat study as requested by radiology scheduled 08/08/2023   Did not go for f/u so scheduled directly with our in house [facility]  10/28/2023 >>>   Discussed in detail all the  indications, usual  risks and alternatives  relative to the benefits with patient who agrees to proceed with w/u as outlined.      Each RESP maintenance medication was reviewed in detail including emphasizing most importantly the difference between maintenance and prns and under what circumstances the prns are to be triggered using an action plan format where appropriate.  Total time for H and P, chart review, counseling,  reviewing hfa/ neb/ 02 device(s) and generating customized AVS unique to this office visit / same day charting = 42 min with pt who is illiterate and took much longer to make sure he understood the instructions orally reviewed line by line with me          AVS  Patient Instructions  Breztri  is  Take 2 puffs first thing in am and then another 2 puffs about 12 hours later.    Work on inhaler technique:  relax and gently blow all the way out then take a nice smooth full deep breath back in, triggering the inhaler at same time you start breathing in.  Hold breath in for at least  5 seconds if you can. Blow out breztri  thru nose. Rinse and gargle with water when done.  If mouth or throat bother you at all,  try brushing teeth/gums/tongue with arm and hammer toothpaste/ make a slurry and gargle and spit out.   Use your albuterol  as a rescue medication to be  used if you can't catch your breath by resting, slowing your pace,  or doing a relaxed purse lip breathing pattern.  - The less you use it, the better it will work when you need it. - Ok to use up to 2 puffs  every 4 hours if you must but call for  appointment if use goes up over your usual need - Don't leave home without it !!  (think of it like a spare tire or starter fluid for your car)   Also  Ok to try albuterol  15 min before an activity (on alternating days)  that you know would usually make you short of breath and see if it makes any difference and if makes none then don't take albuterol  after activity unless you can't catch your breath as this means it's the resting that helps, not the albuterol .       We weill schedule CT CHEST LCS NODULE FOLLOW-UP W/O CM)   Please schedule a follow up visit in 6  months but call sooner if needed  with all medications /inhalers/ solutions in hand so we can verify exactly what you are taking. This includes all medications from all doctors and over the counters ;       Ozell America, MD 10/28/2023

## 2023-10-28 NOTE — Assessment & Plan Note (Addendum)
 Quit smoking 2017 so eligible for LSDSCT thru age 73  - LDSCT  2/20225 new nodule > repeat study as requested by radiology scheduled 08/08/2023   Did not go for f/u so scheduled directly with our in house [facility]  10/28/2023 >>>   Discussed in detail all the  indications, usual  risks and alternatives  relative to the benefits with patient who agrees to proceed with w/u as outlined.      Each RESP maintenance medication was reviewed in detail including emphasizing most importantly the difference between maintenance and prns and under what circumstances the prns are to be triggered using an action plan format where appropriate.  Total time for H and P, chart review, counseling, reviewing hfa/ neb/ 02 device(s) and generating customized AVS unique to this office visit / same day charting = 42 min with pt who is illiterate and took much longer to make sure he understood the instructions orally reviewed line by line with me

## 2023-10-29 ENCOUNTER — Ambulatory Visit: Admitting: Internal Medicine

## 2023-11-08 ENCOUNTER — Other Ambulatory Visit: Payer: Self-pay | Admitting: Family Medicine

## 2023-11-09 ENCOUNTER — Ambulatory Visit (HOSPITAL_COMMUNITY)
Admission: RE | Admit: 2023-11-09 | Discharge: 2023-11-09 | Disposition: A | Discharge status: Home / Self Care | Source: Ambulatory Visit | Attending: Internal Medicine | Admitting: Internal Medicine

## 2023-11-09 DIAGNOSIS — J310 Chronic rhinitis: Secondary | ICD-10-CM | POA: Diagnosis not present

## 2023-11-09 DIAGNOSIS — J449 Chronic obstructive pulmonary disease, unspecified: Secondary | ICD-10-CM | POA: Insufficient documentation

## 2023-11-09 DIAGNOSIS — R918 Other nonspecific abnormal finding of lung field: Secondary | ICD-10-CM | POA: Diagnosis not present

## 2023-11-09 DIAGNOSIS — J479 Bronchiectasis, uncomplicated: Secondary | ICD-10-CM | POA: Diagnosis not present

## 2023-11-09 DIAGNOSIS — J432 Centrilobular emphysema: Secondary | ICD-10-CM | POA: Diagnosis not present

## 2023-11-13 ENCOUNTER — Other Ambulatory Visit: Payer: Self-pay | Admitting: Family Medicine

## 2023-11-14 ENCOUNTER — Telehealth: Payer: Self-pay | Admitting: Internal Medicine

## 2023-11-14 NOTE — Telephone Encounter (Signed)
 Received call report on VM from [facility]. Patient is not currently in LCS program. LDCT impression is below. Will forward to Dr. Darlean and triage to address.    IMPRESSION: 1. New areas of peribronchovascular nodular consolidation in the right lower lobe, likely infectious/inflammatory etiology. Lung-RADS 0, incomplete. Recommend follow-up low-dose lung cancer screening CT in 2-3 months, as malignancy cannot be excluded. These results will be called to the ordering clinician or representative by the Radiologist Assistant, and communication documented in the PACS or [employer]. 2. Cylindrical bronchiectasis. 3. Left anterior descending coronary artery calcification. 4.  Emphysema (ICD10-J43.9).     Electronically Signed   By: Newell Eke M.D.   On: 11/14/2023 10:43

## 2023-11-16 ENCOUNTER — Ambulatory Visit: Payer: Self-pay | Admitting: Internal Medicine

## 2023-11-17 ENCOUNTER — Other Ambulatory Visit: Payer: Self-pay

## 2023-11-17 DIAGNOSIS — R918 Other nonspecific abnormal finding of lung field: Secondary | ICD-10-CM

## 2023-11-17 NOTE — Progress Notes (Signed)
 Ct chest

## 2023-11-18 NOTE — Progress Notes (Signed)
 Called and relayed results to pt, pt confirmed understanding

## 2023-11-18 NOTE — Telephone Encounter (Signed)
 See phone note - scheduled LDSCT

## 2023-11-21 ENCOUNTER — Other Ambulatory Visit: Payer: Self-pay | Admitting: Family Medicine

## 2023-11-28 ENCOUNTER — Telehealth: Payer: Self-pay

## 2023-12-05 ENCOUNTER — Other Ambulatory Visit: Payer: Self-pay | Admitting: Family Medicine

## 2023-12-18 ENCOUNTER — Other Ambulatory Visit: Payer: Self-pay | Admitting: *Deleted

## 2023-12-18 NOTE — Patient Instructions (Signed)
 Visit Information  Thank you for taking time to visit with me today. Please don't hesitate to contact me if I can be of assistance to you before our next scheduled appointment.  Our next appointment is no further scheduled appointments.   Please call the care guide team at [phone] if you need to cancel or reschedule your appointment.   Please call the Suicide and Crisis Lifeline: 988 call the [place]  National Suicide Prevention Lifeline: (506)820-5096 or TTY: 3301240226 TTY 804-744-4625) to talk to a trained counselor call 1-800-273-TALK (toll free, 24 hour hotline) if you are experiencing a Mental Health or Behavioral Health Crisis or need someone to talk to.  Patient verbalized understanding of Care plan and visit instructions communicated this visit  Rosina Forte, BSN RN [facility], [facility] RN Care Manager Direct Dial: [phone]  Fax: [phone]
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
## 2023-12-22 ENCOUNTER — Ambulatory Visit: Admitting: Internal Medicine

## 2023-12-22 ENCOUNTER — Other Ambulatory Visit: Payer: Self-pay | Admitting: Family Medicine

## 2023-12-22 DIAGNOSIS — E119 Type 2 diabetes mellitus without complications: Secondary | ICD-10-CM

## 2023-12-25 ENCOUNTER — Emergency Department (HOSPITAL_COMMUNITY)

## 2023-12-25 ENCOUNTER — Observation Stay (HOSPITAL_COMMUNITY)
Admission: EM | Admit: 2023-12-25 | Discharge: 2023-12-26 | Disposition: A | Attending: Emergency Medicine | Admitting: Emergency Medicine

## 2023-12-25 ENCOUNTER — Other Ambulatory Visit: Payer: Self-pay

## 2023-12-25 DIAGNOSIS — Z681 Body mass index (BMI) 19 or less, adult: Secondary | ICD-10-CM | POA: Diagnosis not present

## 2023-12-25 DIAGNOSIS — J189 Pneumonia, unspecified organism: Secondary | ICD-10-CM | POA: Diagnosis not present

## 2023-12-25 DIAGNOSIS — E119 Type 2 diabetes mellitus without complications: Secondary | ICD-10-CM | POA: Diagnosis not present

## 2023-12-25 DIAGNOSIS — Z79899 Other long term (current) drug therapy: Secondary | ICD-10-CM | POA: Insufficient documentation

## 2023-12-25 DIAGNOSIS — E43 Unspecified severe protein-calorie malnutrition: Secondary | ICD-10-CM | POA: Insufficient documentation

## 2023-12-25 DIAGNOSIS — Z7984 Long term (current) use of oral hypoglycemic drugs: Secondary | ICD-10-CM | POA: Insufficient documentation

## 2023-12-25 DIAGNOSIS — J9621 Acute and chronic respiratory failure with hypoxia: Secondary | ICD-10-CM | POA: Diagnosis not present

## 2023-12-25 DIAGNOSIS — E785 Hyperlipidemia, unspecified: Secondary | ICD-10-CM | POA: Insufficient documentation

## 2023-12-25 DIAGNOSIS — J9611 Chronic respiratory failure with hypoxia: Secondary | ICD-10-CM

## 2023-12-25 DIAGNOSIS — R590 Localized enlarged lymph nodes: Secondary | ICD-10-CM | POA: Diagnosis not present

## 2023-12-25 DIAGNOSIS — J45909 Unspecified asthma, uncomplicated: Secondary | ICD-10-CM | POA: Insufficient documentation

## 2023-12-25 DIAGNOSIS — I10 Essential (primary) hypertension: Secondary | ICD-10-CM | POA: Insufficient documentation

## 2023-12-25 DIAGNOSIS — J441 Chronic obstructive pulmonary disease with (acute) exacerbation: Principal | ICD-10-CM | POA: Diagnosis present

## 2023-12-25 DIAGNOSIS — I700 Atherosclerosis of aorta: Secondary | ICD-10-CM | POA: Diagnosis not present

## 2023-12-25 DIAGNOSIS — Z7722 Contact with and (suspected) exposure to environmental tobacco smoke (acute) (chronic): Secondary | ICD-10-CM | POA: Insufficient documentation

## 2023-12-25 DIAGNOSIS — R0602 Shortness of breath: Secondary | ICD-10-CM | POA: Diagnosis present

## 2023-12-25 DIAGNOSIS — Z87891 Personal history of nicotine dependence: Secondary | ICD-10-CM | POA: Insufficient documentation

## 2023-12-25 DIAGNOSIS — J188 Other pneumonia, unspecified organism: Secondary | ICD-10-CM | POA: Diagnosis not present

## 2023-12-25 DIAGNOSIS — I2510 Atherosclerotic heart disease of native coronary artery without angina pectoris: Secondary | ICD-10-CM | POA: Diagnosis not present

## 2023-12-25 LAB — HEPATIC FUNCTION PANEL
ALT: 26 U/L (ref 0–44)
AST: 36 U/L (ref 15–41)
Albumin: 4.2 g/dL (ref 3.5–5.0)
Alkaline Phosphatase: 131 U/L — ABNORMAL HIGH (ref 38–126)
Bilirubin, Direct: 0.2 mg/dL (ref 0.0–0.2)
Indirect Bilirubin: 0.2 mg/dL — ABNORMAL LOW (ref 0.3–0.9)
Total Bilirubin: 0.4 mg/dL (ref 0.0–1.2)
Total Protein: 7.9 g/dL (ref 6.5–8.1)

## 2023-12-25 LAB — BASIC METABOLIC PANEL WITH GFR
Anion gap: 16 — ABNORMAL HIGH (ref 5–15)
BUN: 13 mg/dL (ref 8–23)
CO2: 27 mmol/L (ref 22–32)
Calcium: 9.9 mg/dL (ref 8.9–10.3)
Chloride: 99 mmol/L (ref 98–111)
Creatinine, Ser: 1.2 mg/dL (ref 0.61–1.24)
GFR, Estimated: >60 mL/min (ref >60)
Glucose, Bld: 188 mg/dL — ABNORMAL HIGH (ref 70–99)
Potassium: 3.6 mmol/L (ref 3.5–5.1)
Sodium: 141 mmol/L (ref 135–145)

## 2023-12-25 LAB — RESP PANEL BY RT-PCR (RSV, FLU A&B, COVID)  RVPGX2
Influenza A by PCR: NEGATIVE
Influenza B by PCR: NEGATIVE
Resp Syncytial Virus by PCR: NEGATIVE
SARS Coronavirus 2 by RT PCR: NEGATIVE

## 2023-12-25 LAB — PRO BRAIN NATRIURETIC PEPTIDE: Pro Brain Natriuretic Peptide: <50 pg/mL (ref <300.0)

## 2023-12-25 LAB — CBC
HCT: 33.4 % — ABNORMAL LOW (ref 39.0–52.0)
Hemoglobin: 10.2 g/dL — ABNORMAL LOW (ref 13.0–17.0)
MCH: 27 pg (ref 26.0–34.0)
MCHC: 30.5 g/dL (ref 30.0–36.0)
MCV: 88.4 fL (ref 80.0–100.0)
Platelets: 231 K/uL (ref 150–400)
RBC: 3.78 MIL/uL — ABNORMAL LOW (ref 4.22–5.81)
RDW: 15.1 % (ref 11.5–15.5)
WBC: 8.1 K/uL (ref 4.0–10.5)
nRBC: 0 % (ref 0.0–0.2)

## 2023-12-25 LAB — HEMOGLOBIN A1C
Hgb A1c MFr Bld: 6.1 % — ABNORMAL HIGH (ref 4.8–5.6)
Mean Plasma Glucose: 128.37 mg/dL

## 2023-12-25 LAB — EXPECTORATED SPUTUM ASSESSMENT W GRAM STAIN, RFLX TO RESP C

## 2023-12-25 LAB — STREP PNEUMONIAE URINARY ANTIGEN: Strep Pneumo Urinary Antigen: NEGATIVE

## 2023-12-25 LAB — LACTIC ACID, PLASMA: Lactic Acid, Venous: 1.4 mmol/L (ref 0.5–1.9)

## 2023-12-25 LAB — PROCALCITONIN: Procalcitonin: 0.21 ng/mL

## 2023-12-25 LAB — GLUCOSE, CAPILLARY: Glucose-Capillary: 188 mg/dL — ABNORMAL HIGH (ref 70–99)

## 2023-12-25 MED ORDER — INSULIN ASPART 100 UNIT/ML IJ SOLN: 0.0000 [IU] | Freq: Every day | INTRAMUSCULAR | Status: DC

## 2023-12-25 MED ORDER — POLYETHYLENE GLYCOL 3350 17 G PO PACK: 17.0000 g | PACK | Freq: Every day | ORAL | Status: DC | PRN

## 2023-12-25 MED ORDER — IOHEXOL 350 MG/ML SOLN
75.0000 mL | Freq: Once | INTRAVENOUS | Status: AC | PRN
  Administered 2023-12-25: 12:00:00 75 mL via INTRAVENOUS

## 2023-12-25 MED ORDER — GUAIFENESIN 100 MG/5ML PO LIQD
5.0000 mL | ORAL | Status: DC | PRN
  Administered 2023-12-25 – 2023-12-26 (×2): 5 mL via ORAL
  Filled 2023-12-25 (×2): qty 5

## 2023-12-25 MED ORDER — ROSUVASTATIN CALCIUM 20 MG PO TABS
20.0000 mg | ORAL_TABLET | Freq: Every day | ORAL | Status: DC
  Administered 2023-12-26: 20 mg via ORAL
  Filled 2023-12-25: qty 1

## 2023-12-25 MED ORDER — SODIUM CHLORIDE 0.9 % IV BOLUS
500.0000 mL | Freq: Once | INTRAVENOUS | Status: AC
  Administered 2023-12-25: 10:00:00 500 mL via INTRAVENOUS

## 2023-12-25 MED ORDER — HYDROCODONE-ACETAMINOPHEN 5-325 MG PO TABS
1.0000 | ORAL_TABLET | ORAL | Status: DC | PRN
  Filled 2023-12-25: qty 1

## 2023-12-25 MED ORDER — SODIUM CHLORIDE 0.9 % IV SOLN
500.0000 mg | Freq: Once | INTRAVENOUS | Status: AC
  Administered 2023-12-25: 13:00:00 500 mg via INTRAVENOUS
  Filled 2023-12-25: qty 5

## 2023-12-25 MED ORDER — ACETAMINOPHEN 325 MG PO TABS: 650.0000 mg | ORAL_TABLET | Freq: Four times a day (QID) | ORAL | Status: DC | PRN

## 2023-12-25 MED ORDER — IPRATROPIUM-ALBUTEROL 0.5-2.5 (3) MG/3ML IN SOLN
3.0000 mL | Freq: Once | RESPIRATORY_TRACT | Status: AC
  Administered 2023-12-25: 11:00:00 3 mL via RESPIRATORY_TRACT

## 2023-12-25 MED ORDER — LEVOCETIRIZINE DIHYDROCHLORIDE 5 MG PO TABS: 5.0000 mg | ORAL_TABLET | Freq: Every evening | ORAL | Status: DC

## 2023-12-25 MED ORDER — MONTELUKAST SODIUM 10 MG PO TABS
10.0000 mg | ORAL_TABLET | Freq: Every day | ORAL | Status: DC
  Administered 2023-12-25: 22:00:00 10 mg via ORAL
  Filled 2023-12-25: qty 1

## 2023-12-25 MED ORDER — SODIUM CHLORIDE 0.9 % IV SOLN
500.0000 mg | INTRAVENOUS | Status: DC
  Administered 2023-12-26: 500 mg via INTRAVENOUS
  Filled 2023-12-25: qty 5

## 2023-12-25 MED ORDER — LORATADINE 10 MG PO TABS
10.0000 mg | ORAL_TABLET | Freq: Every day | ORAL | Status: DC
  Administered 2023-12-25: 19:00:00 10 mg via ORAL
  Filled 2023-12-25: qty 1

## 2023-12-25 MED ORDER — IPRATROPIUM-ALBUTEROL 0.5-2.5 (3) MG/3ML IN SOLN
3.0000 mL | Freq: Once | RESPIRATORY_TRACT | Status: AC
  Administered 2023-12-25: 11:00:00 3 mL via RESPIRATORY_TRACT
  Filled 2023-12-25: qty 6

## 2023-12-25 MED ORDER — TRAZODONE HCL 50 MG PO TABS
50.0000 mg | ORAL_TABLET | Freq: Every evening | ORAL | Status: DC | PRN
  Administered 2023-12-25: 22:00:00 50 mg via ORAL
  Filled 2023-12-25: qty 1

## 2023-12-25 MED ORDER — INSULIN ASPART 100 UNIT/ML IJ SOLN
0.0000 [IU] | Freq: Three times a day (TID) | INTRAMUSCULAR | Status: DC
  Administered 2023-12-26 (×2): 1 [IU] via SUBCUTANEOUS
  Filled 2023-12-25 (×2): qty 1

## 2023-12-25 MED ORDER — AMLODIPINE BESYLATE 5 MG PO TABS
10.0000 mg | ORAL_TABLET | Freq: Every day | ORAL | Status: DC
  Administered 2023-12-25: 22:00:00 10 mg via ORAL
  Filled 2023-12-25: qty 2

## 2023-12-25 MED ORDER — ASPIRIN 81 MG PO TBEC
81.0000 mg | DELAYED_RELEASE_TABLET | Freq: Every day | ORAL | Status: DC
  Administered 2023-12-25 – 2023-12-26 (×2): 81 mg via ORAL
  Filled 2023-12-25 (×3): qty 1

## 2023-12-25 MED ORDER — SODIUM CHLORIDE 0.9 % IV SOLN
1.0000 g | Freq: Once | INTRAVENOUS | Status: AC
  Administered 2023-12-25: 13:00:00 1 g via INTRAVENOUS
  Filled 2023-12-25: qty 10

## 2023-12-25 MED ORDER — IPRATROPIUM-ALBUTEROL 0.5-2.5 (3) MG/3ML IN SOLN
3.0000 mL | Freq: Three times a day (TID) | RESPIRATORY_TRACT | Status: DC
  Administered 2023-12-26 (×2): 3 mL via RESPIRATORY_TRACT
  Filled 2023-12-25: qty 3

## 2023-12-25 MED ORDER — METHYLPREDNISOLONE SODIUM SUCC 125 MG IJ SOLR
125.0000 mg | Freq: Once | INTRAMUSCULAR | Status: AC
  Administered 2023-12-25: 10:00:00 125 mg via INTRAVENOUS
  Filled 2023-12-25: qty 2

## 2023-12-25 MED ORDER — TRIAMTERENE-HCTZ 37.5-25 MG PO TABS
1.0000 | ORAL_TABLET | Freq: Every day | ORAL | Status: DC
  Administered 2023-12-25 – 2023-12-26 (×2): 1 via ORAL
  Filled 2023-12-25 (×4): qty 1

## 2023-12-25 MED ORDER — ACETAMINOPHEN 650 MG RE SUPP: 650.0000 mg | Freq: Four times a day (QID) | RECTAL | Status: DC | PRN

## 2023-12-25 MED ORDER — ONDANSETRON HCL 4 MG PO TABS: 4.0000 mg | ORAL_TABLET | Freq: Four times a day (QID) | ORAL | Status: DC | PRN

## 2023-12-25 MED ORDER — ONDANSETRON HCL 4 MG/2ML IJ SOLN: 4.0000 mg | Freq: Four times a day (QID) | INTRAMUSCULAR | Status: DC | PRN

## 2023-12-25 MED ORDER — ENOXAPARIN SODIUM 40 MG/0.4ML IJ SOSY
40.0000 mg | PREFILLED_SYRINGE | INTRAMUSCULAR | Status: DC
  Administered 2023-12-25: 22:00:00 40 mg via SUBCUTANEOUS
  Filled 2023-12-25: qty 0.4

## 2023-12-25 MED ORDER — SODIUM CHLORIDE 0.9 % IV SOLN
2.0000 g | INTRAVENOUS | Status: DC
  Administered 2023-12-26: 2 g via INTRAVENOUS
  Filled 2023-12-25: qty 20

## 2023-12-25 MED ORDER — IPRATROPIUM-ALBUTEROL 0.5-2.5 (3) MG/3ML IN SOLN
3.0000 mL | Freq: Four times a day (QID) | RESPIRATORY_TRACT | Status: DC
  Administered 2023-12-25 (×2): 3 mL via RESPIRATORY_TRACT
  Filled 2023-12-25 (×2): qty 3

## 2023-12-25 MED ORDER — PREDNISONE 20 MG PO TABS: 40.0000 mg | ORAL_TABLET | Freq: Every day | ORAL | Status: DC

## 2023-12-25 MED ORDER — METHYLPREDNISOLONE SODIUM SUCC 40 MG IJ SOLR
40.0000 mg | Freq: Every day | INTRAMUSCULAR | Status: AC
  Administered 2023-12-26: 40 mg via INTRAVENOUS
  Filled 2023-12-25: qty 1

## 2023-12-25 NOTE — ED Triage Notes (Signed)
 Pt in w/ sob that started yesterday. Pt wears 3L at baseline but has been feeling more sob than normal with a congested cough. Pt sats were 83% on RA d/t pt not coming into ed with O2 tank. Pt sats now back up to 96 % on 4L.

## 2023-12-25 NOTE — ED Provider Notes (Signed)
 [place] EMERGENCY DEPARTMENT AT [facility] Provider Note   CSN: [id number] Arrival date & time: 12/25/23  9071     Patient presents with: Shortness of Breath   [name] is a 73 y.o. male.   Patient is a 73 year old male who presents to the emergency department the chief complaint of increased shortness of breath, productive cough which has been ongoing since yesterday but he has felt poor for the past week.  Patient notes that he has had no associated chest pain, abdominal pain, nausea, vomiting, diarrhea.  He does wear 3 L nasal cannula at his baseline but did not arrive to the emergency department with him.  Patient denies any associated fever or chills.  He denies any other known sick contacts.   Shortness of Breath Associated symptoms: cough        Prior to Admission medications  Medication Sig Start Date End Date Taking? Authorizing Provider  albuterol  (PROVENTIL ) (2.5 MG/3ML) 0.083% nebulizer solution Take 3 mLs (2.5 mg total) by nebulization every 6 (six) hours as needed for wheezing or shortness of breath. 07/29/23   Antonetta Rollene BRAVO, MD  albuterol  (VENTOLIN  HFA) 108 (90 Base) MCG/ACT inhaler Inhale 1-2 puffs into the lungs every 6 (six) hours as needed for wheezing or shortness of breath. 09/04/23   Olalere, Adewale A, MD  amLODipine  (NORVASC ) 10 MG tablet Take 1 tablet (10 mg total) by mouth daily. 02/26/23   Antonetta Rollene BRAVO, MD  aspirin  EC 81 MG tablet Take 81 mg by mouth daily. Swallow whole.    [provider]  budesonide -glycopyrrolate-formoterol  (BREZTRI  AEROSPHERE) 160-9-4.8 MCG/ACT AERO inhaler Take 2 puffs first thing in am and then another 2 puffs about 12 hours later. 08/08/23   Darlean Ozell NOVAK, MD  levocetirizine (XYZAL ) 5 MG tablet Take 1 tablet (5 mg total) by mouth every evening. 12/08/23   Antonetta Rollene BRAVO, MD  metFORMIN  (GLUCOPHAGE ) 500 MG tablet TAKE ONE TABLET BY MOUTH TWICE DAILY WITH A MEAL 12/22/23   Antonetta Rollene BRAVO,  MD  montelukast  (SINGULAIR ) 10 MG tablet Take 1 tablet (10 mg total) by mouth at bedtime. 11/21/23   Antonetta Rollene BRAVO, MD  predniSONE  (DELTASONE ) 5 MG tablet One half pill daily 10/28/23   Wert, Michael B, MD  rosuvastatin  (CRESTOR ) 20 MG tablet TAKE ONE TABLET BY MOUTH EVERY DAY 11/13/23   Antonetta Rollene BRAVO, MD  triamterene -hydrochlorothiazide  (MAXZIDE -25) 37.5-25 MG tablet TAKE ONE AND ONE-HALF TABLETS EVERY DAY 11/10/23   Antonetta Rollene BRAVO, MD  simvastatin  (ZOCOR ) 40 MG tablet Take 40 mg by mouth daily. 10/02/10 03/18/11  Antonetta Rollene BRAVO, MD    Allergies: Patient has no known allergies.    Review of Systems  Respiratory:  Positive for cough and shortness of breath.   All other systems reviewed and are negative.   Updated Vital Signs BP 135/78   Pulse (!) 110   Temp 99 F (37.2 C) (Oral)   Resp (!) 32   Ht 5' 7 (1.702 m)   Wt 52 kg   SpO2 99%   BMI 17.96 kg/m   Physical Exam Vitals and nursing note reviewed.  Constitutional:      General: He is not in acute distress.    Appearance: Normal appearance. He is not ill-appearing.  HENT:     Head: Normocephalic and atraumatic.     Nose: Nose normal.     Mouth/Throat:     Mouth: Mucous membranes are moist.  Eyes:     Extraocular  Movements: Extraocular movements intact.     Conjunctiva/sclera: Conjunctivae normal.     Pupils: Pupils are equal, round, and reactive to light.  Cardiovascular:     Rate and Rhythm: Normal rate and regular rhythm.     Pulses: Normal pulses.     Heart sounds: Normal heart sounds. No murmur heard.    No gallop.  Pulmonary:     Effort: Pulmonary effort is normal. No tachypnea.     Breath sounds: Wheezing present. No decreased breath sounds, rhonchi or rales.  Chest:     Chest wall: No tenderness.  Abdominal:     General: Abdomen is flat. Bowel sounds are normal.     Palpations: Abdomen is soft.     Tenderness: There is no abdominal tenderness. There is no guarding.  Musculoskeletal:         General: Normal range of motion.     Cervical back: Normal range of motion and neck supple.     Right lower leg: No edema.     Left lower leg: No edema.  Skin:    General: Skin is warm and dry.     Findings: No rash.  Neurological:     General: No focal deficit present.     Mental Status: He is alert and oriented to person, place, and time. Mental status is at baseline.  Psychiatric:        Mood and Affect: Mood normal.        Behavior: Behavior normal.        Thought Content: Thought content normal.        Judgment: Judgment normal.     (all labs ordered are listed, but only abnormal results are displayed) Labs Reviewed  CULTURE, BLOOD (ROUTINE X 2)  CULTURE, BLOOD (ROUTINE X 2)  RESP PANEL BY RT-PCR (RSV, FLU A&B, COVID)  [id number]  BASIC METABOLIC PANEL WITH GFR  CBC  PRO BRAIN NATRIURETIC PEPTIDE  HEPATIC FUNCTION PANEL  LACTIC ACID, PLASMA  LACTIC ACID, PLASMA    EKG: None  Radiology: No results found.   Procedures   Medications Ordered in the ED  ipratropium-albuterol  (DUONEB) 0.5-2.5 (3) MG/3ML nebulizer solution 3 mL (has no administration in time range)  ipratropium-albuterol  (DUONEB) 0.5-2.5 (3) MG/3ML nebulizer solution 3 mL (has no administration in time range)  methylPREDNISolone  sodium succinate (SOLU-MEDROL ) 125 mg/2 mL injection 125 mg (has no administration in time range)  sodium chloride  0.9 % bolus 500 mL (has no administration in time range)                                    Medical Decision Making Amount and/or Complexity of Data Reviewed Labs: ordered. Radiology: ordered.  Risk Prescription drug management. Decision regarding hospitalization.   This patient presents to the ED for concern of dyspnea, cough, this involves an extensive number of treatment options, and is a complaint that carries with it a high risk of complications and morbidity.  The differential diagnosis includes pneumonia, acute viral syndrome, pulmonary embolus,  ACS, pneumothorax, hemothorax, CHF   Co morbidities that complicate the patient evaluation  COPD   Additional history obtained:  Additional history obtained from medical records External records from outside source obtained and reviewed including medical records   Lab Tests:  I Ordered, and personally interpreted labs.  The pertinent results include: No leukocytosis, anemia at baseline, normal kidney function liver function, unremarkable electrolytes, normal lactic acid, normal  BNP, negative respiratory panel   Imaging Studies ordered:  I ordered imaging studies including chest x-ray, CTA chest I independently visualized and interpreted imaging which showed no pulmonary embolus, multifocal infiltrates I agree with the radiologist interpretation   Cardiac Monitoring: / EKG:  The patient was maintained on a cardiac monitor.  I personally viewed and interpreted the cardiac monitored which showed an underlying rhythm of: Sinus tachycardia, no ST/T wave changes, no ischemic changes, no STEMI   Consultations Obtained:  I requested consultation with the hospitalist,  and discussed lab and imaging findings as well as pertinent plan - they recommend: Admission   Problem List / ED Course / Critical interventions / Medication management  Patient is doing well at this time and does remain stable.  He is currently maintaining his oxygen  saturations on his home 3 L nasal cannula.  He does remain tachypneic and has ongoing wheezing.  Imaging is concerning for multifocal pneumonia.  He has been treated with antibiotics at this point.  He otherwise does not meet criteria for sepsis at this time.  Have given small amount of IV fluids.  Have discussed patient case with Dr. Vonzell with the hospitalist service who has excepted for admission. I ordered medication including DuoNeb, Solu-Medrol , IV fluids, Rocephin , azithromycin  for COPD exacerbation, pneumonia Reevaluation of the patient after  these medicines showed that the patient improved I have reviewed the patients home medicines and have made adjustments as needed   Social Determinants of Health:  Lives at home alone   Test / Admission - Considered:  Admission     Final diagnoses:  None    ED Discharge Orders     None          Daralene Lonni BIRCH, [place] 12/25/23 1251

## 2023-12-25 NOTE — Progress Notes (Signed)
 PT Cancellation Note  Patient Details Name: [name] [name]: [id number] DOB: 01-19-1950   Cancelled Treatment:    Reason Eval/Treat Not Completed: PT screened, no needs identified, will sign off Per OT evaluation, patient is at baseline and ambulating without AD, O2 stats WNL on 3 Lpm via [place]. No PT needs at this time. Will sign off.    3:13 PM, 12/25/2023 Rosaria Settler, PT, DPT [place] with [facility]

## 2023-12-25 NOTE — H&P (Signed)
 History and Physical    Patient: [name] FMW:[id number] DOB: 20-Jan-1950 DOA: 12/25/2023 DOS: the patient was seen and examined on 12/25/2023 PCP: Antonetta Rollene BRAVO, MD  Patient coming from: Home  Chief Complaint:  Chief Complaint  Patient presents with   Shortness of Breath    HPI: [name] is a 73 y.o. male with history of HTN, HLD, CVA, former smoker, severe COPD, chronic hypoxic respiratory failure, presenting with worsening shortness of breath, cough, productive sputum.  Patient notes that since yesterday has had worsening cough as well as feeling increasingly weak and poor.  Has been coughing up sputum yellow/greenish in color.  Denies any fever, chest pain, nausea, vomiting, abdominal pain, diarrhea.  Denies any sick contacts.  Upon evaluation emergency department, CT imaging noting bilateral opacities concerning for multifocal pneumonia.   Review of Systems: As mentioned in the history of present illness. All other systems reviewed and are negative. Past Medical History:  Diagnosis Date   Asthma    CAP (community acquired pneumonia) 12/10/2018   COPD (chronic obstructive pulmonary disease) (HCC)    COPD with acute exacerbation (HCC) 04/18/2018   CVA (cerebral vascular accident) (HCC) 2008   with temporary vision loss    Depression    ECHOCARDIOGRAM, ABNORMAL 03/06/2010   Qualifier: Diagnosis of  By: Jerilynn, NP, Kathryn     Elevated PSA 2014   no diagnosis pf prostate cancer in 07/2014   History of substance abuse (HCC) 01/14/2011   Marijuana   History of substance abuse (HCC) 01/14/2011   History of tobacco abuse 01/14/2011   Hyperlipidemia    Hypertension    Lobar pneumonia 12/13/2018   Marijuana abuse    Nicotine addiction    Prediabetes 2014   Past Surgical History:  Procedure Laterality Date   COLONOSCOPY     RADIOACTIVE SEED IMPLANT N/A 04/09/2021   Procedure: RADIOACTIVE SEED IMPLANT/BRACHYTHERAPY IMPLANT/64 SEEDS;  Surgeon: Matilda Senior, MD;   Location: [facility];  Service: Urology;  Laterality: N/A;  90 MINS   SPACE OAR INSTILLATION N/A 04/09/2021   Procedure: SPACE OAR INSTILLATION;  Surgeon: Matilda Senior, MD;  Location: [facility];  Service: Urology;  Laterality: N/A;   Social History:  reports that he quit smoking about 8 years ago. His smoking use included cigarettes. He started smoking about 48 years ago. He has a 20 pack-year smoking history. He has been exposed to tobacco smoke. He has never used smokeless tobacco. He reports that he does not currently use alcohol after a past usage of about 3.0 standard drinks of alcohol per week. He reports that he does not use drugs.  Allergies[1]  History reviewed. No pertinent family history.  Prior to Admission medications  Medication Sig Start Date End Date Taking? Authorizing Provider  albuterol  (PROVENTIL ) (2.5 MG/3ML) 0.083% nebulizer solution Take 3 mLs (2.5 mg total) by nebulization every 6 (six) hours as needed for wheezing or shortness of breath. 07/29/23   Antonetta Rollene BRAVO, MD  albuterol  (VENTOLIN  HFA) 108 (90 Base) MCG/ACT inhaler Inhale 1-2 puffs into the lungs every 6 (six) hours as needed for wheezing or shortness of breath. 09/04/23   Olalere, Adewale A, MD  amLODipine  (NORVASC ) 10 MG tablet Take 1 tablet (10 mg total) by mouth daily. 02/26/23   Antonetta Rollene BRAVO, MD  aspirin  EC 81 MG tablet Take 81 mg by mouth daily. Swallow whole.    [provider]  budesonide -glycopyrrolate-formoterol  (BREZTRI  AEROSPHERE) 160-9-4.8 MCG/ACT AERO inhaler Take 2 puffs first thing in am  and then another 2 puffs about 12 hours later. 08/08/23   Darlean Ozell NOVAK, MD  levocetirizine (XYZAL ) 5 MG tablet Take 1 tablet (5 mg total) by mouth every evening. 12/08/23   Antonetta Rollene BRAVO, MD  metFORMIN  (GLUCOPHAGE ) 500 MG tablet TAKE ONE TABLET BY MOUTH TWICE DAILY WITH A MEAL 12/22/23   Antonetta Rollene BRAVO, MD  montelukast  (SINGULAIR ) 10 MG tablet Take 1 tablet (10 mg total) by mouth at bedtime.  11/21/23   Antonetta Rollene BRAVO, MD  predniSONE  (DELTASONE ) 5 MG tablet One half pill daily 10/28/23   Wert, Michael B, MD  rosuvastatin  (CRESTOR ) 20 MG tablet TAKE ONE TABLET BY MOUTH EVERY DAY 11/13/23   Antonetta Rollene BRAVO, MD  triamterene -hydrochlorothiazide  (MAXZIDE -25) 37.5-25 MG tablet TAKE ONE AND ONE-HALF TABLETS EVERY DAY 11/10/23   Antonetta Rollene BRAVO, MD  simvastatin  (ZOCOR ) 40 MG tablet Take 40 mg by mouth daily. 10/02/10 03/18/11  Antonetta Rollene BRAVO, MD    Physical Exam:  Vitals:   12/25/23 1215 12/25/23 1230 12/25/23 1245 12/25/23 1300  BP: 132/67 126/60 120/61 134/71  Pulse: 95 (!) 101 91 88  Resp: (!) 21 (!) 23 (!) 33 (!) 31  Temp:      TempSrc:      SpO2: 95% 97% 98% 96%  Weight:      Height:        GENERAL:  Alert, pleasant, no acute distress, cachectic HEENT:  EOMI, nasal cannula CARDIOVASCULAR:  RRR, no murmurs appreciated RESPIRATORY: Dyspneic, mild wheezing, poor air movement bilaterally GASTROINTESTINAL:  Soft, nontender, nondistended EXTREMITIES: Thin, no LE edema bilaterally NEURO:  No new focal deficits appreciated SKIN:  No rashes noted PSYCH:  Appropriate mood and affect    Data Reviewed:  There are no new results to review at this time.  CT Angio Chest PE W/Cm &/Or Wo Cm Result Date: 12/25/2023 CLINICAL DATA:  Shortness of breath beginning yesterday. Hypoxia. Evaluate for pulmonary embolism. EXAM: CT ANGIOGRAPHY CHEST WITH CONTRAST TECHNIQUE: Multidetector CT imaging of the chest was performed using the standard protocol during bolus administration of intravenous contrast. Multiplanar CT image reconstructions and MIPs were obtained to evaluate the vascular anatomy. RADIATION DOSE REDUCTION: This exam was performed according to the departmental dose-optimization program which includes automated exposure control, adjustment of the mA and/or kV according to patient size and/or use of iterative reconstruction technique. CONTRAST:  75mL OMNIPAQUE  IOHEXOL  350  MG/ML SOLN COMPARISON:  11/09/2023 FINDINGS: Cardiovascular: Heart is normal size. Calcified plaque over the left main and proximal left anterior descending coronary artery. Thoracic aorta is normal in caliber. Pulmonary arterial system is well opacified without evidence of emboli. Remaining vascular structures are unremarkable. Mediastinum/Nodes: 1.2 cm precarinal lymph node. 1.2 cm right hilar lymph node remaining mediastinal structures are unremarkable. Lungs/Pleura: Lungs are adequately inflated with mild centrilobular emphysematous disease and paraseptal emphysematous disease over the apices. Continued evidence of patchy bilateral reticulonodular/tree-in-bud opacification without the more discrete areas of nodular opacification and mucoid impaction with slight overall progression compared to the prior exam. Findings likely due to an atypical infectious versus inflammatory process. Bronchiectatic changes are present. No effusion. Upper Abdomen: Calcified plaque over the abdominal aorta. No acute findings over the visualized upper abdomen. Musculoskeletal: Focal abnormality. Review of the MIP images confirms the above findings. IMPRESSION: 1. No evidence of pulmonary embolism. 2. Continued evidence of patchy bilateral reticulonodular/tree-in-bud opacification with more discrete areas of nodular opacification and mucoid impaction. Slight overall progression compared to the prior exam. Findings likely due to an atypical  infectious process such as MAC versus inflammatory process. Consider follow-up CT 6-8 weeks post treatment. 3. Mild mediastinal and right hilar adenopathy likely reactive. 4. Aortic atherosclerosis. Atherosclerotic coronary artery disease. 5. Emphysema. Aortic Atherosclerosis (ICD10-I70.0) and Emphysema (ICD10-J43.9). Electronically Signed   By: Toribio Agreste M.D.   On: 12/25/2023 12:31   DG Chest Port 1 View Result Date: 12/25/2023 CLINICAL DATA:  Shortness of breath beginning yesterday. EXAM:  PORTABLE CHEST 1 VIEW COMPARISON:  04/14/2023 FINDINGS: Lungs are hyperexpanded without effusion or pneumothorax. Emphysematous disease. Subtle patchy perihilar/infrahilar density bilaterally slightly worse which may be due to infectious or inflammatory process. Cardiomediastinal silhouette and remainder of the exam is unchanged. IMPRESSION: 1. Subtle patchy perihilar/infrahilar density bilaterally slightly worse which may be due to infectious or inflammatory process. 2. Emphysematous disease. Electronically Signed   By: Toribio Agreste M.D.   On: 12/25/2023 10:18    Assessment and Plan:  Acute on chronic hypoxic respiratory failure - Initially requiring 4 L nasal cannula (baseline 3 L).  Able to be weaned down to patient's baseline 3 L, still with mild dyspnea however.  Continue to monitor closely.  Acute COPD exacerbation - Patient with underlying severe COPD, now with worsening cough and increased sputum production.  Will initiate IV Solu-Medrol , scheduled nebulizers, empiric antibiotics as below.  Supplemental O2.  Multifocal pneumonia - CT imaging noting bilateral tree-in-bud opacities suggestive of possible atypical infection.  Patient with worsening cough and sputum production.  Rapid COVID/flu/RSV negative.  Will order respiratory pathogen profile, sputum cultures, strep pneumo/Legionella urine antigens.  Placed on empiric ceftriaxone  plus azithromycin .  Nebulizers as above.  Mucinex  on board.  Diabetes mellitus - Will initiate insulin  sliding scale, carb consistent diet.  HTN/HLD - Will resume patient's home medical regimen.  Physical debilitation muscle weakness/severe protein calorie malnutrition - Patient very cachectic, weak.  Likely exacerbated by above as well.  Will order PT/OT.  Advance Care Planning:   Code Status: Full Code   Consults: None  Family Communication: None at bedside  Severity of Illness: The appropriate patient status for this patient is OBSERVATION.  Observation status is judged to be reasonable and necessary in order to provide the required intensity of service to ensure the patient's safety. The patient's presenting symptoms, physical exam findings, and initial radiographic and laboratory data in the context of their medical condition is felt to place them at decreased risk for further clinical deterioration. Furthermore, it is anticipated that the patient will be medically stable for discharge from the hospital within 2 midnights of admission.   Author: Carliss LELON Canales, DO 12/25/2023 1:17 PM  For on call review [email or web address].     [1] No Known Allergies

## 2023-12-25 NOTE — Hospital Course (Signed)
 73 y.o. male with history of HTN, HLD, CVA, former smoker, severe COPD, chronic hypoxic respiratory failure, presenting with worsening shortness of breath, cough, productive sputum.   Assessment and Plan:   Acute on chronic hypoxic respiratory failure - Initially requiring 4 L nasal cannula (baseline 3 L).  Able to be weaned down to patient's baseline 3 L, still with mild dyspnea however.  Continue to monitor closely.   Acute COPD exacerbation - Patient with underlying severe COPD, now with worsening cough and increased sputum production.  Will initiate IV Solu-Medrol , scheduled nebulizers, empiric antibiotics as below.  Supplemental O2.   Multifocal pneumonia - CT imaging noting bilateral tree-in-bud opacities suggestive of possible atypical infection.  Patient with worsening cough and sputum production.  Rapid COVID/flu/RSV negative.  Will order respiratory pathogen profile, sputum cultures, strep pneumo/Legionella urine antigens.  Placed on empiric ceftriaxone  plus azithromycin .  Nebulizers as above.  Mucinex  on board.   Diabetes mellitus - Will initiate insulin  sliding scale, carb consistent diet.   HTN/HLD - Will resume patient's home medical regimen.   Physical debilitation muscle weakness/severe protein calorie malnutrition - Patient very cachectic, weak.  Likely exacerbated by above as well.  Will order PT/OT.

## 2023-12-25 NOTE — Evaluation (Signed)
 Occupational Therapy Evaluation Patient Details Name: [name] [name]: [id number] DOB: 28-May-1950 Today's Date: 12/25/2023   History of Present Illness   [name] is a 73 y.o. male with history of HTN, HLD, CVA, former smoker, severe COPD, chronic hypoxic respiratory failure, presenting with worsening shortness of breath, cough, productive sputum.  Patient notes that since yesterday has had worsening cough as well as feeling increasingly weak and poor.  Has been coughing up sputum yellow/greenish in color.  Denies any fever, chest pain, nausea, vomiting, abdominal pain, diarrhea.  Denies any sick contacts. (per DO)     Clinical Impressions Pt agreeable to OT evaluation. Pt appears to be at or near baseline. Pt uses 3 LPM supplemental O2 at baseline and saturated 90% or above while ambulating on 3 LPM. Pt able to demonstrated bed mobility at mod I and ambulated independently in the hall without AD. WFL B UE strength and functional use. Pt is independent for ADL's. Pt left in the chair with call bell within reach and nursing staff notified. Pt is not recommended for any further acute OT services and will be discharged to care of nursing staff for remaining length of stay.        Functional Status Assessment   Patient has not had a recent decline in their functional status     Equipment Recommendations   None recommended by OT             Precautions/Restrictions   Precautions Precautions: Fall Recall of Precautions/Restrictions: Intact Restrictions Weight Bearing Restrictions Per Provider Order: No     Mobility Bed Mobility Overal bed mobility: Modified Independent             General bed mobility comments: HOB elevated to simulate home environment.    Transfers Overall transfer level: Independent                        Balance Overall balance assessment: Independent                                         ADL either  performed or assessed with clinical judgement   ADL Overall ADL's : Independent                                             Vision Baseline Vision/History: 0 No visual deficits Ability to See in Adequate Light: 0 Adequate Patient Visual Report: No change from baseline Vision Assessment?: No apparent visual deficits     Perception Perception: Not tested       Praxis Praxis: Not tested       Pertinent Vitals/Pain Pain Assessment Pain Assessment: No/denies pain     Extremity/Trunk Assessment Upper Extremity Assessment Upper Extremity Assessment: Overall WFL for tasks assessed   Lower Extremity Assessment Lower Extremity Assessment: Overall WFL for tasks assessed   Cervical / Trunk Assessment Cervical / Trunk Assessment: Normal   Communication Communication Communication: No apparent difficulties   Cognition Arousal: Alert Behavior During Therapy: WFL for tasks assessed/performed Cognition: No apparent impairments                               Following commands: Intact  Cueing  General Comments   Cueing Techniques: Verbal cues                 Home Living Family/patient expects to be discharged to:: Private residence Living Arrangements: Alone Available Help at Discharge: Other (Comment) (Nurse PRN according to pt) Type of Home: House Home Access: Stairs to enter [employer] of Steps: 2 Entrance Stairs-Rails: Right;Left;Can reach both Home Layout: One level     Bathroom Shower/Tub: Chief Strategy Officer: Standard Bathroom Accessibility: Yes How Accessible: Accessible via wheelchair;Accessible via walker Home Equipment: Cane - single point;Shower seat;Grab bars - tub/shower          Prior Functioning/Environment Prior Level of Function : Independent/Modified Independent             Mobility Comments: Tourist information centre manager without AD; drives moped ADLs Comments: Independent                 AM-PAC OT 6 Clicks Daily Activity     Outcome Measure Help from another [name] eating meals?: None Help from another [name] taking care of personal grooming?: None Help from another [name] toileting, which includes using toliet, bedpan, or urinal?: None Help from another [name] bathing (including washing, rinsing, drying)?: None Help from another [name] to put on and taking off regular upper body clothing?: None Help from another [name] to put on and taking off regular lower body clothing?: None 6 Click Score: 24   End of Session Equipment Utilized During Treatment: Oxygen  (3 LPM; saturation remained above 90% while ambulating.)  Activity Tolerance:   Patient left:    OT Visit Diagnosis: Unsteadiness on feet (R26.81);Muscle weakness (generalized) (M62.81)                Time: 8569-8552 OT Time Calculation (min): 17 min Charges:  OT General Charges $OT Visit: 1 Visit OT Evaluation $OT Eval Low Complexity: 1 Low  [name] OT, MOT  [name] 12/25/2023, 3:14 PM

## 2023-12-26 DIAGNOSIS — J9621 Acute and chronic respiratory failure with hypoxia: Secondary | ICD-10-CM | POA: Diagnosis not present

## 2023-12-26 LAB — CBC
HCT: 30.2 % — ABNORMAL LOW (ref 39.0–52.0)
Hemoglobin: 9.2 g/dL — ABNORMAL LOW (ref 13.0–17.0)
MCH: 27 pg (ref 26.0–34.0)
MCHC: 30.5 g/dL (ref 30.0–36.0)
MCV: 88.6 fL (ref 80.0–100.0)
Platelets: 206 K/uL (ref 150–400)
RBC: 3.41 MIL/uL — ABNORMAL LOW (ref 4.22–5.81)
RDW: 14.8 % (ref 11.5–15.5)
WBC: 6.9 K/uL (ref 4.0–10.5)
nRBC: 0 % (ref 0.0–0.2)

## 2023-12-26 LAB — GLUCOSE, CAPILLARY
Glucose-Capillary: 137 mg/dL — ABNORMAL HIGH (ref 70–99)
Glucose-Capillary: 125 mg/dL — ABNORMAL HIGH (ref 70–99)

## 2023-12-26 LAB — RESPIRATORY PANEL BY PCR

## 2023-12-26 LAB — BASIC METABOLIC PANEL WITH GFR
Anion gap: 7 (ref 5–15)
BUN: 15 mg/dL (ref 8–23)
CO2: 34 mmol/L — ABNORMAL HIGH (ref 22–32)
Calcium: 9.4 mg/dL (ref 8.9–10.3)
Chloride: 99 mmol/L (ref 98–111)
Creatinine, Ser: 0.95 mg/dL (ref 0.61–1.24)
GFR, Estimated: >60 mL/min
Glucose, Bld: 135 mg/dL — ABNORMAL HIGH (ref 70–99)
Potassium: 4.4 mmol/L (ref 3.5–5.1)
Sodium: 140 mmol/L (ref 135–145)

## 2023-12-26 LAB — MAGNESIUM: Magnesium: 2.7 mg/dL — ABNORMAL HIGH (ref 1.7–2.4)

## 2023-12-26 LAB — MRSA NEXT GEN BY PCR, NASAL: MRSA by PCR Next Gen: NOT DETECTED

## 2023-12-26 MED ORDER — PREDNISONE 20 MG PO TABS: 40.0000 mg | ORAL_TABLET | Freq: Every day | ORAL | 0 refills | Status: DC

## 2023-12-26 MED ORDER — GUAIFENESIN 100 MG/5ML PO LIQD: 5.0000 mL | ORAL | 0 refills | Status: DC | PRN

## 2023-12-26 MED ORDER — AZITHROMYCIN 500 MG PO TABS: 500.0000 mg | ORAL_TABLET | Freq: Every day | ORAL | 0 refills | Status: DC

## 2023-12-26 MED ORDER — AMOXICILLIN-POT CLAVULANATE 875-125 MG PO TABS: 1.0000 | ORAL_TABLET | Freq: Two times a day (BID) | ORAL | 0 refills | Status: DC

## 2023-12-26 NOTE — Progress Notes (Signed)
" °   12/26/23 0855  TOC Brief Assessment  Insurance and Status Reviewed  Patient has primary care physician Yes  Home environment has been reviewed From Home  Prior level of function: Independent  Prior/Current Home Services No current home services  Social Drivers of Health Review SDOH reviewed no interventions necessary  Readmission risk has been reviewed Yes  Transition of care needs no transition of care needs at this time   Inpatient Care Management (ICM) has reviewed patient and no other ICM needs have been identified at this time. We will continue to monitor patient advancement through interdisciplinary progression rounds. If new patient transition needs arise, please place a ICM consult. "

## 2023-12-26 NOTE — Care Management Obs Status (Signed)
 MEDICARE OBSERVATION STATUS NOTIFICATION   Patient Details  Name: [name] [name]: [id number] Date of Birth: 23-Sep-1950   Medicare Observation Status Notification Given:  Yes    Duwaine LITTIE Ada 12/26/2023, 1:14 PM

## 2023-12-26 NOTE — Discharge Summary (Signed)
 " Physician Discharge Summary   Patient: [name] [name]: [id number] DOB: 07-Aug-1950  Admit date:     12/25/2023  Discharge date: 12/26/2023  Discharge Physician: Carliss LELON Canales   PCP: Antonetta Rollene BRAVO, MD   Recommendations at discharge:    Pt to be discharged home.   If you experience worsening fever, chills, chest pain, shortness of breath, or other concerning symptoms, please call your PCP or go to the emergency department immediately.  Discharge Diagnoses: Principal Problem:   Acute on chronic hypoxic respiratory failure (HCC) Active Problems:   COPD exacerbation (HCC)   COPD with acute exacerbation (HCC)   Multifocal pneumonia   Controlled type 2 diabetes mellitus without complication, without long-term current use of insulin  (HCC)   Chronic respiratory failure with hypoxia (HCC)   Community acquired pneumonia  Resolved Problems:   * No resolved hospital problems. *   Hospital Course:  73 y.o. male with history of HTN, HLD, CVA, former smoker, severe COPD, chronic hypoxic respiratory failure, presenting with worsening shortness of breath, cough, productive sputum.   Assessment and Plan:   Acute on chronic hypoxic respiratory failure - Initially requiring 4 L nasal cannula (baseline 3 L).  Able to be weaned down to patient's baseline 3 L.  Patient ambulatory, motivated for discharge home..   Acute COPD exacerbation - Patient with underlying severe COPD, now with worsening cough and increased sputum production.  Responded well to IV Solu-Medrol , scheduled nebulizers, empiric antibiotics as below.  Supplemental O2.  Patient feeling improved, motivated to be discharged home.  Will transition patient to p.o. prednisone  to take 40 mg daily for the next 4 days, then transition back to normal 5 milligram daily regimen.   Multifocal pneumonia - CT imaging noting bilateral tree-in-bud opacities suggestive of possible atypical infection.  Patient with worsening cough and  sputum production.  Rapid COVID/flu/RSV negative.  Received empiric ceftriaxone  plus azithromycin .  Nebulizers.  Mucinex  on board.  Will transition patient to p.o. Augmentin  plus azithromycin  to take for the next 5 days upon discharge.  Mucinex  ordered as well.  Steroids as above.   Diabetes mellitus - Resume home medication regiment   HTN/HLD - Will resume patient's home medical regimen.  Severe protein calorie malnutrition - Encourage protein calorie supplementation.    Consultants: None Procedures performed: None Disposition: Home Diet recommendation:  Discharge Diet Orders (From admission, onward)     Start     Ordered   12/26/23 0000  Diet Carb Modified        12/26/23 0942           Cardiac and Carb modified diet  DISCHARGE MEDICATION: Allergies as of 12/26/2023   No Known Allergies      Medication List     TAKE these medications    albuterol  (2.5 MG/3ML) 0.083% nebulizer solution Commonly known as: PROVENTIL  Take 3 mLs (2.5 mg total) by nebulization every 6 (six) hours as needed for wheezing or shortness of breath.   albuterol  108 (90 Base) MCG/ACT inhaler Commonly known as: VENTOLIN  HFA Inhale 1-2 puffs into the lungs every 6 (six) hours as needed for wheezing or shortness of breath.   amLODipine  10 MG tablet Commonly known as: NORVASC  Take 1 tablet (10 mg total) by mouth daily.   amoxicillin -clavulanate 875-125 MG tablet Commonly known as: AUGMENTIN  Take 1 tablet by mouth 2 (two) times daily.   aspirin  EC 81 MG tablet Take 81 mg by mouth daily. Swallow whole.   azithromycin  500 MG tablet  Commonly known as: Zithromax  Take 1 tablet (500 mg total) by mouth daily.   Breztri  Aerosphere 160-9-4.8 MCG/ACT Aero inhaler Generic drug: budesonide -glycopyrrolate-formoterol  Take 2 puffs first thing in am and then another 2 puffs about 12 hours later.   guaiFENesin  100 MG/5ML liquid Commonly known as: ROBITUSSIN Take 5 mLs by mouth every 4 (four) hours  as needed for cough or to loosen phlegm.   levocetirizine 5 MG tablet Commonly known as: XYZAL  Take 1 tablet (5 mg total) by mouth every evening.   metFORMIN  500 MG tablet Commonly known as: GLUCOPHAGE  TAKE ONE TABLET BY MOUTH TWICE DAILY WITH A MEAL   montelukast  10 MG tablet Commonly known as: SINGULAIR  Take 1 tablet (10 mg total) by mouth at bedtime.   predniSONE  5 MG tablet Commonly known as: DELTASONE  One half pill daily What changed: Another medication with the same name was added. Make sure you understand how and when to take each.   predniSONE  20 MG tablet Commonly known as: DELTASONE  Take 2 tablets (40 mg total) by mouth daily with breakfast. Take 40mg  daily x 4 days, then transition back to normal 5mg  daily regimen. Start taking on: December 27, 2023 What changed: You were already taking a medication with the same name, and this prescription was added. Make sure you understand how and when to take each.   rosuvastatin  20 MG tablet Commonly known as: CRESTOR  TAKE ONE TABLET BY MOUTH EVERY DAY   triamterene -hydrochlorothiazide  37.5-25 MG tablet Commonly known as: MAXZIDE -25 TAKE ONE AND ONE-HALF TABLETS EVERY DAY         Discharge Exam: Filed Weights   12/25/23 0943  Weight: 52 kg    GENERAL:  Alert, pleasant, no acute distress, cachectic HEENT:  EOMI, nasal cannula CARDIOVASCULAR:  RRR, no murmurs appreciated RESPIRATORY: poor air movement bilaterally GASTROINTESTINAL:  Soft, nontender, nondistended EXTREMITIES: Thin, no LE edema bilaterally NEURO:  No new focal deficits appreciated SKIN:  No rashes noted PSYCH:  Appropriate mood and affect    Condition at discharge: improving  The results of significant diagnostics from this hospitalization (including imaging, microbiology, ancillary and laboratory) are listed below for reference.   Imaging Studies: CT Angio Chest PE W/Cm &/Or Wo Cm Result Date: 12/25/2023 CLINICAL DATA:  Shortness of breath  beginning yesterday. Hypoxia. Evaluate for pulmonary embolism. EXAM: CT ANGIOGRAPHY CHEST WITH CONTRAST TECHNIQUE: Multidetector CT imaging of the chest was performed using the standard protocol during bolus administration of intravenous contrast. Multiplanar CT image reconstructions and MIPs were obtained to evaluate the vascular anatomy. RADIATION DOSE REDUCTION: This exam was performed according to the departmental dose-optimization program which includes automated exposure control, adjustment of the mA and/or kV according to patient size and/or use of iterative reconstruction technique. CONTRAST:  75mL OMNIPAQUE  IOHEXOL  350 MG/ML SOLN COMPARISON:  11/09/2023 FINDINGS: Cardiovascular: Heart is normal size. Calcified plaque over the left main and proximal left anterior descending coronary artery. Thoracic aorta is normal in caliber. Pulmonary arterial system is well opacified without evidence of emboli. Remaining vascular structures are unremarkable. Mediastinum/Nodes: 1.2 cm precarinal lymph node. 1.2 cm right hilar lymph node remaining mediastinal structures are unremarkable. Lungs/Pleura: Lungs are adequately inflated with mild centrilobular emphysematous disease and paraseptal emphysematous disease over the apices. Continued evidence of patchy bilateral reticulonodular/tree-in-bud opacification without the more discrete areas of nodular opacification and mucoid impaction with slight overall progression compared to the prior exam. Findings likely due to an atypical infectious versus inflammatory process. Bronchiectatic changes are present. No effusion. Upper Abdomen: Calcified plaque  over the abdominal aorta. No acute findings over the visualized upper abdomen. Musculoskeletal: Focal abnormality. Review of the MIP images confirms the above findings. IMPRESSION: 1. No evidence of pulmonary embolism. 2. Continued evidence of patchy bilateral reticulonodular/tree-in-bud opacification with more discrete areas of  nodular opacification and mucoid impaction. Slight overall progression compared to the prior exam. Findings likely due to an atypical infectious process such as MAC versus inflammatory process. Consider follow-up CT 6-8 weeks post treatment. 3. Mild mediastinal and right hilar adenopathy likely reactive. 4. Aortic atherosclerosis. Atherosclerotic coronary artery disease. 5. Emphysema. Aortic Atherosclerosis (ICD10-I70.0) and Emphysema (ICD10-J43.9). Electronically Signed   By: Toribio Agreste M.D.   On: 12/25/2023 12:31   DG Chest Port 1 View Result Date: 12/25/2023 CLINICAL DATA:  Shortness of breath beginning yesterday. EXAM: PORTABLE CHEST 1 VIEW COMPARISON:  04/14/2023 FINDINGS: Lungs are hyperexpanded without effusion or pneumothorax. Emphysematous disease. Subtle patchy perihilar/infrahilar density bilaterally slightly worse which may be due to infectious or inflammatory process. Cardiomediastinal silhouette and remainder of the exam is unchanged. IMPRESSION: 1. Subtle patchy perihilar/infrahilar density bilaterally slightly worse which may be due to infectious or inflammatory process. 2. Emphysematous disease. Electronically Signed   By: Toribio Agreste M.D.   On: 12/25/2023 10:18    Microbiology: Results for orders placed or performed during the hospital encounter of 12/25/23  Culture, blood (routine x 2)     Status: None (Preliminary result)   Collection Time: 12/25/23  9:47 AM   Specimen: BLOOD  Result Value Ref Range Status   Specimen Description BLOOD  Final   Special Requests NONE  Final   Culture   Final    NO GROWTH < 24 HOURS Performed at [facility], [address], [place], [place] [address]    Report Status PENDING  Incomplete  Resp panel by RT-PCR (RSV, Flu A&B, Covid) Anterior Nasal Swab     Status: None   Collection Time: 12/25/23  9:48 AM   Specimen: Anterior Nasal Swab  Result Value Ref Range Status   SARS Coronavirus 2 by RT PCR NEGATIVE NEGATIVE Final    Comment:  (NOTE) SARS-CoV-2 target nucleic acids are NOT DETECTED.  The SARS-CoV-2 RNA is generally detectable in upper respiratory specimens during the acute phase of infection. The lowest concentration of SARS-CoV-2 viral copies this assay can detect is 138 copies/mL. A negative result does not preclude SARS-Cov-2 infection and should not be used as the sole basis for treatment or other patient management decisions. A negative result may occur with  improper specimen collection/handling, submission of specimen other than nasopharyngeal swab, presence of viral mutation(s) within the areas targeted by this assay, and inadequate number of viral copies(<138 copies/mL). A negative result must be combined with clinical observations, patient history, and epidemiological information. The expected result is Negative.  Fact Sheet for Patients:  bloggercourse.com  Fact Sheet for Healthcare Providers:  seriousbroker.it  This test is no t yet approved or cleared by the [place]  FDA and  has been authorized for detection and/or diagnosis of SARS-CoV-2 by FDA under an Emergency Use Authorization (EUA). This EUA will remain  in effect (meaning this test can be used) for the duration of the COVID-19 declaration under Section 564(b)(1) of the Act, 21 U.S.C.section 360bbb-3(b)(1), unless the authorization is terminated  or revoked sooner.       Influenza A by PCR NEGATIVE NEGATIVE Final   Influenza B by PCR NEGATIVE NEGATIVE Final    Comment: (NOTE) The Xpert Xpress SARS-CoV-2/FLU/RSV plus assay is  intended as an aid in the diagnosis of influenza from Nasopharyngeal swab specimens and should not be used as a sole basis for treatment. Nasal washings and aspirates are unacceptable for Xpert Xpress SARS-CoV-2/FLU/RSV testing.  Fact Sheet for Patients: bloggercourse.com  Fact Sheet for Healthcare  Providers: seriousbroker.it  This test is not yet approved or cleared by the [place]  FDA and has been authorized for detection and/or diagnosis of SARS-CoV-2 by FDA under an Emergency Use Authorization (EUA). This EUA will remain in effect (meaning this test can be used) for the duration of the COVID-19 declaration under Section 564(b)(1) of the Act, 21 U.S.C. section 360bbb-3(b)(1), unless the authorization is terminated or revoked.     Resp Syncytial Virus by PCR NEGATIVE NEGATIVE Final    Comment: (NOTE) Fact Sheet for Patients: bloggercourse.com  Fact Sheet for Healthcare Providers: seriousbroker.it  This test is not yet approved or cleared by the [place]  FDA and has been authorized for detection and/or diagnosis of SARS-CoV-2 by FDA under an Emergency Use Authorization (EUA). This EUA will remain in effect (meaning this test can be used) for the duration of the COVID-19 declaration under Section 564(b)(1) of the Act, 21 U.S.C. section 360bbb-3(b)(1), unless the authorization is terminated or revoked.  Performed at [facility], [address], [place], [place] [address]   Culture, blood (routine x 2)     Status: None (Preliminary result)   Collection Time: 12/25/23  9:52 AM   Specimen: BLOOD  Result Value Ref Range Status   Specimen Description BLOOD  Final   Special Requests NONE  Final   Culture   Final    NO GROWTH < 24 HOURS Performed at [facility], [address], [place], [place] [address]    Report Status PENDING  Incomplete  Expectorated Sputum Assessment w Gram Stain, Rflx to Resp Cult     Status: None   Collection Time: 12/25/23  2:24 PM   Specimen: Expectorated Sputum  Result Value Ref Range Status   Specimen Description EXPECTORATED SPUTUM  Final   Special Requests NONE  Final   Sputum evaluation   Final    THIS SPECIMEN IS ACCEPTABLE FOR SPUTUM CULTURE Performed at  [facility], [address], [place], [place] [address]    Report Status 12/25/2023 FINAL  Final  Culture, Respiratory w Gram Stain     Status: None (Preliminary result)   Collection Time: 12/25/23  2:24 PM  Result Value Ref Range Status   Specimen Description   Final    EXPECTORATED SPUTUM Performed at [facility], [address], [place], [place] [address]    Special Requests   Final    NONE Reflexed from [phone] [phone] Performed at [facility], [address], [place], [place] [address]    Gram Stain   Final    FEW WBC PRESENT, PREDOMINANTLY PMN ABUNDANT GRAM NEGATIVE DIPLOCOCCI Performed at [facility], [address], [place], [place] [address]    Culture PENDING  Incomplete   Report Status PENDING  Incomplete  Respiratory (~20 pathogens) panel by PCR     Status: None   Collection Time: 12/26/23  5:35 AM   Specimen: Nasopharyngeal Swab; Respiratory  Result Value Ref Range Status   Adenovirus NOT DETECTED NOT DETECTED Final   Coronavirus 229E NOT DETECTED NOT DETECTED Final    Comment: (NOTE) The Coronavirus on the Respiratory Panel, DOES NOT test for the novel  Coronavirus (2019 nCoV)    Coronavirus HKU1 NOT DETECTED NOT DETECTED Final  Coronavirus NL63 NOT DETECTED NOT DETECTED Final   Coronavirus OC43 NOT DETECTED NOT DETECTED Final   Metapneumovirus NOT DETECTED NOT DETECTED Final   Rhinovirus / Enterovirus NOT DETECTED NOT DETECTED Final   Influenza A NOT DETECTED NOT DETECTED Final   Influenza B NOT DETECTED NOT DETECTED Final   Parainfluenza Virus 1 NOT DETECTED NOT DETECTED Final   Parainfluenza Virus 2 NOT DETECTED NOT DETECTED Final   Parainfluenza Virus 3 NOT DETECTED NOT DETECTED Final   Parainfluenza Virus 4 NOT DETECTED NOT DETECTED Final   Respiratory Syncytial Virus NOT DETECTED NOT DETECTED Final   Bordetella pertussis NOT DETECTED NOT DETECTED Final   Bordetella Parapertussis NOT DETECTED NOT DETECTED Final   Chlamydophila pneumoniae NOT DETECTED NOT  DETECTED Final   Mycoplasma pneumoniae NOT DETECTED NOT DETECTED Final    Comment: Performed at [facility], [address], [place], [place] [address]  MRSA Next Gen by PCR, Nasal     Status: None   Collection Time: 12/26/23  5:35 AM   Specimen: Nasal Mucosa; Nasal Swab  Result Value Ref Range Status   MRSA by PCR Next Gen NOT DETECTED NOT DETECTED Final    Comment: (NOTE) The GeneXpert MRSA Assay (FDA approved for NASAL specimens only), is one component of a comprehensive MRSA colonization surveillance program. It is not intended to diagnose MRSA infection nor to guide or monitor treatment for MRSA infections. Test performance is not FDA approved in patients less than 44 years old. Performed at [facility], [address] [place], [place] [address]     Labs: CBC: Recent Labs  Lab 12/25/23 0945 12/26/23 0517  WBC 8.1 6.9  HGB 10.2* 9.2*  HCT 33.4* 30.2*  MCV 88.4 88.6  PLT 231 206   Basic Metabolic Panel: Recent Labs  Lab 12/25/23 0945 12/26/23 0517  NA 141 140  K 3.6 4.4  CL 99 99  CO2 27 34*  GLUCOSE 188* 135*  BUN 13 15  CREATININE 1.20 0.95  CALCIUM  9.9 9.4  MG  --  2.7*   Liver Function Tests: Recent Labs  Lab 12/25/23 0945  AST 36  ALT 26  ALKPHOS 131*  BILITOT 0.4  PROT 7.9  ALBUMIN 4.2   CBG: Recent Labs  Lab 12/25/23 1937 12/26/23 0720  GLUCAP 188* 137*    Discharge time spent: 25 minutes.  Length of inpatient stay: 0 days  Signed: Carliss LELON Canales, DO [facility] 12/26/2023         "

## 2023-12-26 NOTE — Plan of Care (Signed)
 " Problem: Education: Goal: Ability to describe self-care measures that may prevent or decrease complications (Diabetes Survival Skills Education) will improve Outcome: Adequate for Discharge Goal: Individualized Educational Video(s) Outcome: Adequate for Discharge   Problem: Coping: Goal: Ability to adjust to condition or change in health will improve Outcome: Adequate for Discharge   Problem: Fluid Volume: Goal: Ability to maintain a balanced intake and output will improve Outcome: Adequate for Discharge   Problem: Health Behavior/Discharge Planning: Goal: Ability to identify and utilize available resources and services will improve Outcome: Adequate for Discharge Goal: Ability to manage health-related needs will improve Outcome: Adequate for Discharge   Problem: Metabolic: Goal: Ability to maintain appropriate glucose levels will improve Outcome: Adequate for Discharge   Problem: Nutritional: Goal: Maintenance of adequate nutrition will improve Outcome: Adequate for Discharge Goal: Progress toward achieving an optimal weight will improve Outcome: Adequate for Discharge   Problem: Skin Integrity: Goal: Risk for impaired skin integrity will decrease Outcome: Adequate for Discharge   Problem: Tissue Perfusion: Goal: Adequacy of tissue perfusion will improve Outcome: Adequate for Discharge   Problem: Education: Goal: Knowledge of General Education information will improve Description: Including pain rating scale, medication(s)/side effects and non-pharmacologic comfort measures Outcome: Adequate for Discharge   Problem: Health Behavior/Discharge Planning: Goal: Ability to manage health-related needs will improve Outcome: Adequate for Discharge   Problem: Clinical Measurements: Goal: Ability to maintain clinical measurements within normal limits will improve Outcome: Adequate for Discharge Goal: Will remain free from infection Outcome: Adequate for Discharge Goal:  Diagnostic test results will improve Outcome: Adequate for Discharge Goal: Respiratory complications will improve Outcome: Adequate for Discharge Goal: Cardiovascular complication will be avoided Outcome: Adequate for Discharge   Problem: Activity: Goal: Risk for activity intolerance will decrease Outcome: Adequate for Discharge   Problem: Nutrition: Goal: Adequate nutrition will be maintained Outcome: Adequate for Discharge   Problem: Coping: Goal: Level of anxiety will decrease Outcome: Adequate for Discharge   Problem: Elimination: Goal: Will not experience complications related to bowel motility Outcome: Adequate for Discharge Goal: Will not experience complications related to urinary retention Outcome: Adequate for Discharge   Problem: Pain Managment: Goal: General experience of comfort will improve and/or be controlled Outcome: Adequate for Discharge   Problem: Safety: Goal: Ability to remain free from injury will improve Outcome: Adequate for Discharge   Problem: Skin Integrity: Goal: Risk for impaired skin integrity will decrease Outcome: Adequate for Discharge   Problem: Education: Goal: Knowledge of disease or condition will improve Outcome: Adequate for Discharge Goal: Knowledge of the prescribed therapeutic regimen will improve Outcome: Adequate for Discharge Goal: Individualized Educational Video(s) Outcome: Adequate for Discharge   Problem: Activity: Goal: Ability to tolerate increased activity will improve Outcome: Adequate for Discharge Goal: Will verbalize the importance of balancing activity with adequate rest periods Outcome: Adequate for Discharge   Problem: Respiratory: Goal: Ability to maintain a clear airway will improve Outcome: Adequate for Discharge Goal: Levels of oxygenation will improve Outcome: Adequate for Discharge Goal: Ability to maintain adequate ventilation will improve Outcome: Adequate for Discharge   Problem:  Activity: Goal: Ability to tolerate increased activity will improve Outcome: Adequate for Discharge   Problem: Clinical Measurements: Goal: Ability to maintain a body temperature in the normal range will improve Outcome: Adequate for Discharge   Problem: Respiratory: Goal: Ability to maintain adequate ventilation will improve Outcome: Adequate for Discharge Goal: Ability to maintain a clear airway will improve Outcome: Adequate for Discharge   Problem: Education: Goal: Ability to describe  self-care measures that may prevent or decrease complications (Diabetes Survival Skills Education) will improve Outcome: Adequate for Discharge Goal: Individualized Educational Video(s) Outcome: Adequate for Discharge   Problem: Coping: Goal: Ability to adjust to condition or change in health will improve Outcome: Adequate for Discharge   Problem: Fluid Volume: Goal: Ability to maintain a balanced intake and output will improve Outcome: Adequate for Discharge   Problem: Health Behavior/Discharge Planning: Goal: Ability to identify and utilize available resources and services will improve Outcome: Adequate for Discharge Goal: Ability to manage health-related needs will improve Outcome: Adequate for Discharge   Problem: Metabolic: Goal: Ability to maintain appropriate glucose levels will improve Outcome: Adequate for Discharge   Problem: Nutritional: Goal: Maintenance of adequate nutrition will improve Outcome: Adequate for Discharge Goal: Progress toward achieving an optimal weight will improve Outcome: Adequate for Discharge   Problem: Skin Integrity: Goal: Risk for impaired skin integrity will decrease Outcome: Adequate for Discharge   Problem: Tissue Perfusion: Goal: Adequacy of tissue perfusion will improve Outcome: Adequate for Discharge   Problem: Education: Goal: Knowledge of General Education information will improve Description: Including pain rating scale,  medication(s)/side effects and non-pharmacologic comfort measures Outcome: Adequate for Discharge   Problem: Health Behavior/Discharge Planning: Goal: Ability to manage health-related needs will improve Outcome: Adequate for Discharge   Problem: Clinical Measurements: Goal: Ability to maintain clinical measurements within normal limits will improve Outcome: Adequate for Discharge Goal: Will remain free from infection Outcome: Adequate for Discharge Goal: Diagnostic test results will improve Outcome: Adequate for Discharge Goal: Respiratory complications will improve Outcome: Adequate for Discharge Goal: Cardiovascular complication will be avoided Outcome: Adequate for Discharge   Problem: Activity: Goal: Risk for activity intolerance will decrease Outcome: Adequate for Discharge   Problem: Nutrition: Goal: Adequate nutrition will be maintained Outcome: Adequate for Discharge   Problem: Coping: Goal: Level of anxiety will decrease Outcome: Adequate for Discharge   Problem: Elimination: Goal: Will not experience complications related to bowel motility Outcome: Adequate for Discharge Goal: Will not experience complications related to urinary retention Outcome: Adequate for Discharge   Problem: Pain Managment: Goal: General experience of comfort will improve and/or be controlled Outcome: Adequate for Discharge   Problem: Safety: Goal: Ability to remain free from injury will improve Outcome: Adequate for Discharge   Problem: Skin Integrity: Goal: Risk for impaired skin integrity will decrease Outcome: Adequate for Discharge   Problem: Education: Goal: Knowledge of disease or condition will improve Outcome: Adequate for Discharge Goal: Knowledge of the prescribed therapeutic regimen will improve Outcome: Adequate for Discharge Goal: Individualized Educational Video(s) Outcome: Adequate for Discharge   Problem: Activity: Goal: Ability to tolerate increased activity  will improve Outcome: Adequate for Discharge Goal: Will verbalize the importance of balancing activity with adequate rest periods Outcome: Adequate for Discharge   Problem: Respiratory: Goal: Ability to maintain a clear airway will improve Outcome: Adequate for Discharge Goal: Levels of oxygenation will improve Outcome: Adequate for Discharge Goal: Ability to maintain adequate ventilation will improve Outcome: Adequate for Discharge   Problem: Activity: Goal: Ability to tolerate increased activity will improve Outcome: Adequate for Discharge   Problem: Clinical Measurements: Goal: Ability to maintain a body temperature in the normal range will improve Outcome: Adequate for Discharge   Problem: Respiratory: Goal: Ability to maintain adequate ventilation will improve Outcome: Adequate for Discharge Goal: Ability to maintain a clear airway will improve Outcome: Adequate for Discharge   "

## 2023-12-27 LAB — CULTURE, RESPIRATORY W GRAM STAIN

## 2023-12-29 ENCOUNTER — Telehealth: Payer: Self-pay

## 2023-12-29 LAB — LEGIONELLA PNEUMOPHILA SEROGP 1 UR AG: L. pneumophila Serogp 1 Ur Ag: NEGATIVE

## 2023-12-29 NOTE — Transitions of Care (Post Inpatient/ED Visit) (Signed)
 "  12/29/2023  Name: [name] [name]: [id number] DOB: 11-Dec-1950  Today's TOC FU Call Status: Today's TOC FU Call Status:: Successful TOC FU Call Completed TOC FU Call Complete Date: 12/29/23  Patient's Name and Date of Birth confirmed. DOB, Name  Transition Care Management Follow-up Telephone Call Date of Discharge: 12/26/23 [facility] Type of Discharge: Inpatient Admission Primary Inpatient Discharge Diagnosis:: Acute on chronic hypoxic respiratory failure How have you been since you were released from the hospital?: Better Any questions or concerns?: No  Items Reviewed: Did you receive and understand the discharge instructions provided?: Yes Medications obtained,verified, and reconciled?: Yes (Medications Reviewed) Any new allergies since your discharge?: No Dietary orders reviewed?: NA Do you have support at home?: No  Medications Reviewed Today: Medications Reviewed Today     Reviewed by Lavelle Charmaine NOVAK, LPN (Licensed Practical Nurse) on 12/29/23 at 1618  Med List Status: <None>   Medication Order Taking? Sig Documenting Provider Last Dose Status Informant  albuterol  (PROVENTIL ) (2.5 MG/3ML) 0.083% nebulizer solution [id number] Yes Take 3 mLs (2.5 mg total) by nebulization every 6 (six) hours as needed for wheezing or shortness of breath. Antonetta Rollene BRAVO, MD  Active Self, Pharmacy Records  albuterol  (VENTOLIN  HFA) 108 [id number] Base) MCG/ACT inhaler [id number] Yes Inhale 1-2 puffs into the lungs every 6 (six) hours as needed for wheezing or shortness of breath. Neda Jennet LABOR, MD  Active Self, Pharmacy Records  amLODipine  (NORVASC ) 10 MG tablet [id number] Yes Take 1 tablet (10 mg total) by mouth daily. Antonetta Rollene BRAVO, MD  Active Self, Pharmacy Records  amoxicillin -clavulanate (AUGMENTIN ) 875-125 MG tablet [id number] Yes Take 1 tablet by mouth 2 (two) times daily. Arlon Carliss ORN, DO  Active   aspirin  EC 81 MG tablet [id number] Yes Take 81 mg by  mouth daily. Swallow whole. [provider]  Active Self, Pharmacy Records  azithromycin  (ZITHROMAX ) 500 MG tablet [id number] Yes Take 1 tablet (500 mg total) by mouth daily. Arlon Carliss ORN, DO  Active   budesonide -glycopyrrolate-formoterol  (BREZTRI  AEROSPHERE) 160-9-4.8 MCG/ACT AERO inhaler [id number] Yes Take 2 puffs first thing in am and then another 2 puffs about 12 hours later. Darlean Ozell NOVAK, MD  Active Self, Pharmacy Records  guaiFENesin  [facility]) 100 MG/5ML liquid [id number] Yes Take 5 mLs by mouth every 4 (four) hours as needed for cough or to loosen phlegm. Arlon Carliss ORN, DO  Active   levocetirizine (XYZAL ) 5 MG tablet [id number] Yes Take 1 tablet (5 mg total) by mouth every evening. Antonetta Rollene BRAVO, MD  Active Self, Pharmacy Records  metFORMIN  (GLUCOPHAGE ) 500 MG tablet [id number] Yes TAKE ONE TABLET BY MOUTH TWICE DAILY WITH A MEAL Antonetta Rollene BRAVO, MD  Active Self, Pharmacy Records  montelukast  (SINGULAIR ) 10 MG tablet [id number] Yes Take 1 tablet (10 mg total) by mouth at bedtime. Antonetta Rollene BRAVO, MD  Active Self, Pharmacy Records  predniSONE  (DELTASONE ) 20 MG tablet [id number] Yes Take 2 tablets (40 mg total) by mouth daily with breakfast. Take 40mg  daily x 4 days, then transition back to normal 5mg  daily regimen. Arlon Carliss ORN, DO  Active   predniSONE  (DELTASONE ) 5 MG tablet [id number] Yes One half pill daily Wert, Michael B, MD  Active Self, Pharmacy Records  rosuvastatin  (CRESTOR ) 20 MG tablet [id number] Yes TAKE ONE TABLET BY MOUTH EVERY DAY Antonetta Rollene BRAVO, MD  Active Self, Pharmacy Records    Discontinued 03/18/11 1530 triamterene -hydrochlorothiazide  (MAXZIDE -25) 37.5-25 MG tablet [id number] Yes TAKE ONE AND ONE-HALF TABLETS EVERY DAY  Antonetta Rollene BRAVO, MD  Active Self, Pharmacy Records            Home Care and Equipment/Supplies: Were Home Health Services Ordered?: NA Any new equipment or medical supplies ordered?: NA  Functional  Questionnaire: Do you need assistance with bathing/showering or dressing?: No Do you need assistance with meal preparation?: No Do you need assistance with eating?: No Do you have difficulty maintaining continence: No Do you need assistance with getting out of bed/getting out of a chair/moving?: No Do you have difficulty managing or taking your medications?: No  Follow up appointments reviewed: PCP Follow-up appointment confirmed?: Yes Date of PCP follow-up appointment?: 12/30/23 Follow-up Provider: Dr. Tobie Specialist [facility] Follow-up appointment confirmed?: NA Do you need transportation to your follow-up appointment?: No Do you understand care options if your condition(s) worsen?: Yes-patient verbalized understanding    SIGNATURE Charmaine Bloodgood, LPN [facility] Health Advisor [facility] You Are. We Are. [facility] Direct Dial [phone]  "

## 2023-12-30 ENCOUNTER — Telehealth: Payer: Self-pay

## 2023-12-30 ENCOUNTER — Ambulatory Visit (INDEPENDENT_AMBULATORY_CARE_PROVIDER_SITE_OTHER): Payer: Self-pay | Admitting: Internal Medicine

## 2023-12-30 ENCOUNTER — Encounter: Payer: Self-pay | Admitting: Internal Medicine

## 2023-12-30 VITALS — BP 142/76 | HR 96 | Ht 67.0 in | Wt 118.0 lb

## 2023-12-30 DIAGNOSIS — J441 Chronic obstructive pulmonary disease with (acute) exacerbation: Secondary | ICD-10-CM

## 2023-12-30 DIAGNOSIS — J9611 Chronic respiratory failure with hypoxia: Secondary | ICD-10-CM

## 2023-12-30 DIAGNOSIS — Z23 Encounter for immunization: Secondary | ICD-10-CM

## 2023-12-30 DIAGNOSIS — J9621 Acute and chronic respiratory failure with hypoxia: Secondary | ICD-10-CM | POA: Diagnosis not present

## 2023-12-30 DIAGNOSIS — K2950 Unspecified chronic gastritis without bleeding: Secondary | ICD-10-CM | POA: Diagnosis not present

## 2023-12-30 DIAGNOSIS — Z09 Encounter for follow-up examination after completed treatment for conditions other than malignant neoplasm: Secondary | ICD-10-CM

## 2023-12-30 DIAGNOSIS — D509 Iron deficiency anemia, unspecified: Secondary | ICD-10-CM

## 2023-12-30 LAB — CULTURE, BLOOD (ROUTINE X 2)
Culture: NO GROWTH
Culture: NO GROWTH

## 2023-12-30 MED ORDER — OMEPRAZOLE 20 MG PO CPDR: 20.0000 mg | DELAYED_RELEASE_CAPSULE | Freq: Every day | ORAL | 3 refills | Status: AC

## 2023-12-30 MED ORDER — IRON (FERROUS SULFATE) 325 (65 FE) MG PO TABS: 325.0000 mg | ORAL_TABLET | Freq: Every day | ORAL | 1 refills | Status: AC

## 2023-12-30 NOTE — Assessment & Plan Note (Signed)
 Due to COPD On 3 LPM home O2, uses it continuously at home, but does not have POC O2 - needs to discuss with pulmonology

## 2023-12-30 NOTE — Patient Instructions (Addendum)
 Please complete course of Augmentin .  Please use Breztri  regularly and use Albuterol  as needed for shortness of breath or wheezing.  Please take Mucinex  as needed for cough.  Please start taking ferrous sulfate  325 mg once daily.  Please continue to take other medications as prescribed.  Please continue to follow low carb diet and ambulate as tolerated.

## 2023-12-30 NOTE — Telephone Encounter (Signed)
 LVM for patient to call and discuss scheduling

## 2023-12-30 NOTE — Assessment & Plan Note (Signed)
 Started omeprazole  20 mg QD as he is on chronic oral steroids

## 2023-12-30 NOTE — Assessment & Plan Note (Signed)
 Last CBC showed Hb of 9.2, baseline around 11 Some component of dilutional anemia during recent hospitalization No signs of active bleeding Advised to start taking ferrous sulfate  325 mg QD Added omeprazole  as he is on chronic oral steroids

## 2023-12-30 NOTE — Assessment & Plan Note (Signed)
 Hospital chart reviewed, including discharge summary Medications reconciled and reviewed with the patient in detail

## 2023-12-30 NOTE — Progress Notes (Signed)
 "  Established Patient Office Visit  Subjective:  Patient ID: [name], male    DOB: 02-Apr-1950  Age: 73 y.o. [name]: [id number]  CC:  Chief Complaint  Patient presents with   Hospital discharge follow-up    For COPD exacerbation    HPI [name] is a 73 y.o. male with past medical history of HTN, HLD, CVA, former smoker, severe COPD, chronic hypoxic respiratory failure,  presents for follow-up after recent hospitalization from 12/25/23-12/26/23.  He presented with worsening shortness of breath, cough, productive sputum for 1 week. He responded well to IV Solu-Medrol , scheduled nebulizers, empiric antibiotics - ceftriaxone  plus azithromycin . Initially requiring 4 L nasal cannula (baseline 3 L), but was able to be weaned down to patient's baseline 3 L. He was given Augmentin  plus azithromycin  to take for the next 5 days upon discharge.  Today, he reports improvement in cough and dyspnea.  He continues to use Breztri  regularly and uses albuterol  as needed for dyspnea or wheezing.  Denies any episode of fever or chills recently.  He has home O2 at 3 LPM, but is satting well on room air at rest today.  He has history of exertional dyspnea and hypoxia, for which he uses home O2.  His BP was elevated today, but he reports that he has not taken Maxzide  this morning.  Denies any headache, dizziness, chest pain or palpitations.   Past Medical History:  Diagnosis Date   Asthma    CAP (community acquired pneumonia) 12/10/2018   COPD (chronic obstructive pulmonary disease) (HCC)    COPD with acute exacerbation (HCC) 04/18/2018   CVA (cerebral vascular accident) (HCC) 2008   with temporary vision loss    Depression    ECHOCARDIOGRAM, ABNORMAL 03/06/2010   Qualifier: Diagnosis of  By: Jerilynn, NP, Kathryn     Elevated PSA 2014   no diagnosis pf prostate cancer in 07/2014   History of substance abuse (HCC) 01/14/2011   Marijuana   History of substance abuse (HCC) 01/14/2011   History of  tobacco abuse 01/14/2011   Hyperlipidemia    Hypertension    Lobar pneumonia 12/13/2018   Marijuana abuse    Nicotine addiction    Prediabetes 2014    Past Surgical History:  Procedure Laterality Date   COLONOSCOPY     RADIOACTIVE SEED IMPLANT N/A 04/09/2021   Procedure: RADIOACTIVE SEED IMPLANT/BRACHYTHERAPY IMPLANT/64 SEEDS;  Surgeon: Matilda Senior, MD;  Location: [facility];  Service: Urology;  Laterality: N/A;  90 MINS   SPACE OAR INSTILLATION N/A 04/09/2021   Procedure: SPACE OAR INSTILLATION;  Surgeon: Matilda Senior, MD;  Location: [facility];  Service: Urology;  Laterality: N/A;    History reviewed. No pertinent family history.  Social History   Socioeconomic History   Marital status: Widowed    Spouse name: Not on file   Number of children: 1   Years of education: 60 the grade   Highest education level: 11th grade  Occupational History   Occupation: unemployed, retired  Tobacco Use   Smoking status: Former    Current packs/day: 0.00    Average packs/day: 0.5 packs/day for 40.0 years (20.0 ttl pk-yrs)    Types: Cigarettes    Start date: 04/14/1975    Quit date: 04/14/2015    Years since quitting: 8.7    Passive exposure: Past   Smokeless tobacco: Never  Vaping Use   Vaping status: Never Used  Substance and Sexual Activity   Alcohol use: Not Currently    Alcohol/week:  3.0 standard drinks of alcohol    Types: 3 Cans of beer per week    Comment: occasionally   Drug use: No    Comment: pt quit 20 years ago   Sexual activity: Not Currently    Partners: Female  Other Topics Concern   Not on file  Social History Narrative   Not on file   Social Drivers of Health   Tobacco Use: Medium Risk (12/30/2023)   Patient History    Smoking Tobacco Use: Former    Smokeless Tobacco Use: Never    Passive Exposure: Past  Physicist, Medical Strain: Low Risk (09/12/2023)   Overall Financial Resource Strain (CARDIA)    Difficulty of Paying Living Expenses: Not hard at all  Food  Insecurity: No Food Insecurity (12/25/2023)   Epic    Worried About Radiation Protection Practitioner of Food in the Last Year: Never true    Ran Out of Food in the Last Year: Never true  Transportation Needs: No Transportation Needs (12/25/2023)   Epic    Lack of Transportation (Medical): No    Lack of Transportation (Non-Medical): No  Physical Activity: Sufficiently Active (09/03/2023)   Exercise Vital Sign    Days of Exercise per Week: 7 days    Minutes of Exercise per Session: 30 min  Stress: No Stress Concern Present (09/03/2023)   [employer] of Occupational Health - Occupational Stress Questionnaire    Feeling of Stress: Not at all  Social Connections: Socially Isolated (12/25/2023)   Social Connection and Isolation Panel    Frequency of Communication with Friends and Family: Never    Frequency of Social Gatherings with Friends and Family: Never    Attends Religious Services: Never    Database Administrator or Organizations: No    Attends Banker Meetings: Never    Marital Status: Widowed  Intimate Partner Violence: Not At Risk (12/25/2023)   Epic    Fear of Current or Ex-Partner: No    Emotionally Abused: No    Physically Abused: No    Sexually Abused: No  Depression (PHQ2-9): Low Risk (12/18/2023)   Depression (PHQ2-9)    PHQ-2 Score: 3  Alcohol Screen: Low Risk (09/03/2023)   Alcohol Screen    Last Alcohol Screening Score (AUDIT): 0  Housing: Low Risk (12/25/2023)   Epic    Unable to Pay for Housing in the Last Year: No    Number of Times Moved in the Last Year: 0    Homeless in the Last Year: No  Utilities: Not At Risk (12/25/2023)   Epic    Threatened with loss of utilities: No  Health Literacy: Adequate Health Literacy (09/03/2023)   B1300 Health Literacy    Frequency of need for help with medical instructions: Never    Outpatient Medications Prior to Visit  Medication Sig Dispense Refill   albuterol  (PROVENTIL ) (2.5 MG/3ML) 0.083% nebulizer solution Take 3 mLs  (2.5 mg total) by nebulization every 6 (six) hours as needed for wheezing or shortness of breath. 180 mL 3   albuterol  (VENTOLIN  HFA) 108 (90 Base) MCG/ACT inhaler Inhale 1-2 puffs into the lungs every 6 (six) hours as needed for wheezing or shortness of breath. 8.5 g 5   amLODipine  (NORVASC ) 10 MG tablet Take 1 tablet (10 mg total) by mouth daily. 90 tablet 3   amoxicillin -clavulanate (AUGMENTIN ) 875-125 MG tablet Take 1 tablet by mouth 2 (two) times daily. 10 tablet 0   aspirin  EC 81 MG tablet Take 81 mg by  mouth daily. Swallow whole.     azithromycin  (ZITHROMAX ) 500 MG tablet Take 1 tablet (500 mg total) by mouth daily. 5 tablet 0   budesonide -glycopyrrolate-formoterol  (BREZTRI  AEROSPHERE) 160-9-4.8 MCG/ACT AERO inhaler Take 2 puffs first thing in am and then another 2 puffs about 12 hours later. 10.7 g 11   guaiFENesin  (ROBITUSSIN) 100 MG/5ML liquid Take 5 mLs by mouth every 4 (four) hours as needed for cough or to loosen phlegm. 120 mL 0   levocetirizine (XYZAL ) 5 MG tablet Take 1 tablet (5 mg total) by mouth every evening. 30 tablet 2   metFORMIN  (GLUCOPHAGE ) 500 MG tablet TAKE ONE TABLET BY MOUTH TWICE DAILY WITH A MEAL 180 tablet 1   montelukast  (SINGULAIR ) 10 MG tablet Take 1 tablet (10 mg total) by mouth at bedtime. 90 tablet 3   predniSONE  (DELTASONE ) 5 MG tablet One half pill daily 90 tablet 3   rosuvastatin  (CRESTOR ) 20 MG tablet TAKE ONE TABLET BY MOUTH EVERY DAY 100 tablet 3   triamterene -hydrochlorothiazide  (MAXZIDE -25) 37.5-25 MG tablet TAKE ONE AND ONE-HALF TABLETS EVERY DAY 135 tablet 1   predniSONE  (DELTASONE ) 20 MG tablet Take 2 tablets (40 mg total) by mouth daily with breakfast. Take 40mg  daily x 4 days, then transition back to normal 5mg  daily regimen. 8 tablet 0   No facility-administered medications prior to visit.    Allergies[1]  ROS Review of Systems  Constitutional:  Negative for chills and fever.  HENT:  Negative for congestion and sore throat.   Eyes:   Negative for pain and discharge.  Respiratory:  Positive for cough (Chronic) and shortness of breath (Exertional).   Cardiovascular:  Negative for chest pain and palpitations.  Gastrointestinal:  Negative for diarrhea, nausea and vomiting.  Endocrine: Negative for polydipsia and polyuria.  Genitourinary:  Negative for dysuria and hematuria.  Musculoskeletal:  Negative for neck pain and neck stiffness.  Skin:  Negative for rash.  Neurological:  Negative for dizziness and weakness.  Psychiatric/Behavioral:  Negative for agitation and behavioral problems.       Objective:    Physical Exam Vitals reviewed.  Constitutional:      General: He is not in acute distress.    Appearance: He is not diaphoretic.  HENT:     Head: Normocephalic and atraumatic.     Nose: Nose normal.     Mouth/Throat:     Mouth: Mucous membranes are moist.  Eyes:     General: No scleral icterus.    Extraocular Movements: Extraocular movements intact.  Cardiovascular:     Rate and Rhythm: Normal rate and regular rhythm.     Heart sounds: Normal heart sounds. No murmur heard. Pulmonary:     Breath sounds: Normal breath sounds. No wheezing or rales.  Musculoskeletal:     Cervical back: Neck supple. No tenderness.     Right lower leg: No edema.     Left lower leg: No edema.  Skin:    General: Skin is warm.     Findings: No rash.  Neurological:     General: No focal deficit present.     Mental Status: He is alert and oriented to person, place, and time.  Psychiatric:        Mood and Affect: Mood normal.        Behavior: Behavior normal.     BP (!) 142/76 (BP Location: Left Arm)   Pulse 96   Ht 5' 7 (1.702 m)   Wt 118 lb (53.5 kg)   SpO2  96%   BMI 18.48 kg/m  Wt Readings from Last 3 Encounters:  12/30/23 118 lb (53.5 kg)  12/25/23 114 lb 10.2 oz (52 kg)  10/28/23 114 lb 12.8 oz (52.1 kg)    Lab Results  Component Value Date   TSH 2.790 03/20/2023   Lab Results  Component Value Date   WBC  6.9 12/26/2023   HGB 9.2 (L) 12/26/2023   HCT 30.2 (L) 12/26/2023   MCV 88.6 12/26/2023   PLT 206 12/26/2023   Lab Results  Component Value Date   NA 140 12/26/2023   K 4.4 12/26/2023   CO2 34 (H) 12/26/2023   GLUCOSE 135 (H) 12/26/2023   BUN 15 12/26/2023   CREATININE 0.95 12/26/2023   BILITOT 0.4 12/25/2023   ALKPHOS 131 (H) 12/25/2023   AST 36 12/25/2023   ALT 26 12/25/2023   PROT 7.9 12/25/2023   ALBUMIN 4.2 12/25/2023   CALCIUM  9.4 12/26/2023   ANIONGAP 7 12/26/2023   EGFR 72 05/30/2023   Lab Results  Component Value Date   CHOL 136 03/20/2023   Lab Results  Component Value Date   HDL 42 03/20/2023   Lab Results  Component Value Date   LDLCALC 76 03/20/2023   Lab Results  Component Value Date   TRIG 96 03/20/2023   Lab Results  Component Value Date   CHOLHDL 3.2 03/20/2023   Lab Results  Component Value Date   HGBA1C 6.1 (H) 12/25/2023      Assessment & Plan:   Problem List Items Addressed This Visit       Respiratory   COPD exacerbation (HCC) - Primary   Recent COPD exacerbation related hospitalization Responded well to IV ceftriaxone  and azithromycin , which was later transitioned to oral Augmentin  and azithromycin  Was given prednisone  40 mg QD x 5 days, followed by continuous 5 mg QD Continue Breztri  as maintenance inhaler and albuterol  as rescue inhaler Followed by pulmonology      Chronic respiratory failure with hypoxia (HCC)   Due to COPD On 3 LPM home O2, uses it continuously at home, but does not have POC O2 - needs to discuss with pulmonology        Digestive   Chronic gastritis without bleeding   Started omeprazole  20 mg QD as he is on chronic oral steroids      Relevant Medications   omeprazole  (PRILOSEC) 20 MG capsule     Other   Microcytic anemia   Last CBC showed Hb of 9.2, baseline around 11 Some component of dilutional anemia during recent hospitalization No signs of active bleeding Advised to start taking ferrous  sulfate 325 mg QD Added omeprazole  as he is on chronic oral steroids      Relevant Medications   Iron , Ferrous Sulfate , 325 (65 Fe) MG TABS   Hospital discharge follow-up   Hospital chart reviewed, including discharge summary Medications reconciled and reviewed with the patient in detail      Other Visit Diagnoses       Encounter for immunization       Relevant Orders   Flu vaccine HIGH DOSE PF(Fluzone Trivalent) (Completed)       Meds ordered this encounter  Medications   omeprazole  (PRILOSEC) 20 MG capsule    Sig: Take 1 capsule (20 mg total) by mouth daily.    Dispense:  90 capsule    Refill:  3   Iron , Ferrous Sulfate , 325 (65 Fe) MG TABS    Sig: Take 325 mg by mouth  daily.    Dispense:  100 tablet    Refill:  1    Follow-up: Return if symptoms worsen or fail to improve.    Suzzane MARLA Blanch, MD     [1] No Known Allergies  "

## 2023-12-30 NOTE — Assessment & Plan Note (Signed)
 Recent COPD exacerbation related hospitalization Responded well to IV ceftriaxone  and azithromycin , which was later transitioned to oral Augmentin  and azithromycin  Was given prednisone  40 mg QD x 5 days, followed by continuous 5 mg QD Continue Breztri  as maintenance inhaler and albuterol  as rescue inhaler Followed by pulmonology

## 2023-12-30 NOTE — Telephone Encounter (Signed)
 Shared pt between Patel ([facility]) and [facility] - Per Tobie pt was in [facility] and did nop bring his 02 said he is supposed to have poc but pt did not qualify at last ov and his walk. Tobie thinks we should get him in for appt within the next month to see if he will qualify again. Per Tobie he had desat while in [facility].   Appt needed with [facility] in a month for poc qualification

## 2023-12-31 NOTE — Telephone Encounter (Signed)
 LVM for patient to call and discuss scheduling

## 2024-01-07 ENCOUNTER — Other Ambulatory Visit: Payer: Self-pay

## 2024-01-07 MED ORDER — BREZTRI AEROSPHERE 160-9-4.8 MCG/ACT IN AERO: 2.0000 | INHALATION_SPRAY | Freq: Two times a day (BID) | RESPIRATORY_TRACT | Status: AC

## 2024-01-08 ENCOUNTER — Other Ambulatory Visit: Payer: Self-pay | Admitting: Family Medicine

## 2024-01-18 ENCOUNTER — Other Ambulatory Visit: Payer: Self-pay | Admitting: Pulmonary Disease

## 2024-01-23 ENCOUNTER — Ambulatory Visit: Admitting: Family Medicine

## 2024-01-27 ENCOUNTER — Ambulatory Visit: Admitting: Family Medicine

## 2024-01-27 ENCOUNTER — Encounter: Payer: Self-pay | Admitting: Family Medicine

## 2024-01-27 VITALS — BP 136/67 | HR 116 | Resp 16 | Ht 67.0 in | Wt 117.1 lb

## 2024-01-27 DIAGNOSIS — J449 Chronic obstructive pulmonary disease, unspecified: Secondary | ICD-10-CM

## 2024-01-27 DIAGNOSIS — J189 Pneumonia, unspecified organism: Secondary | ICD-10-CM

## 2024-01-27 DIAGNOSIS — I10 Essential (primary) hypertension: Secondary | ICD-10-CM | POA: Diagnosis not present

## 2024-01-27 DIAGNOSIS — R9389 Abnormal findings on diagnostic imaging of other specified body structures: Secondary | ICD-10-CM | POA: Diagnosis not present

## 2024-01-27 DIAGNOSIS — Z8546 Personal history of malignant neoplasm of prostate: Secondary | ICD-10-CM

## 2024-01-27 DIAGNOSIS — E118 Type 2 diabetes mellitus with unspecified complications: Secondary | ICD-10-CM

## 2024-01-27 NOTE — Assessment & Plan Note (Signed)
 Controlled, no change in medication DASH diet and commitment to daily physical activity for a minimum of 30 minutes discussed and encouraged, as a part of hypertension management. The importance of attaining a healthy weight is also discussed.     01/27/2024    3:25 PM 12/30/2023   10:05 AM 12/30/2023    9:59 AM 12/30/2023    9:43 AM 12/26/2023    1:15 PM 12/26/2023   11:00 AM 12/26/2023    5:19 AM  BP/Weight  Systolic BP 136 142 146 168 126 120 129  Diastolic BP 67 76 76 83 72 71 67  Wt. (Lbs) 117.08   118     BMI 18.34 kg/m2   18.48 kg/m2

## 2024-01-27 NOTE — Assessment & Plan Note (Signed)
 Diabetes associated with hypertension and hyperlipidemia  [name] is reminded of the importance of commitment to daily physical activity for 30 minutes or more, as able and the need to limit carbohydrate intake to 30 to 60 grams per meal to help with blood sugar control.   The need to take medication as prescribed, test blood sugar as directed, and to call between visits if there is a concern that blood sugar is uncontrolled is also discussed.   [name] is reminded of the importance of daily foot exam, annual eye examination, and good blood sugar, blood pressure and cholesterol control.     Latest Ref Rng & Units 12/26/2023    5:17 AM 12/25/2023    1:47 PM 12/25/2023    9:45 AM 05/30/2023   11:18 AM 03/28/2023    9:00 AM  Diabetic Labs  HbA1c 4.8 - 5.6 %  6.1      Creatinine 0.61 - 1.24 mg/dL 9.04   8.79  8.91  7.82       01/27/2024    3:25 PM 12/30/2023   10:05 AM 12/30/2023    9:59 AM 12/30/2023    9:43 AM 12/26/2023    1:15 PM 12/26/2023   11:00 AM 12/26/2023    5:19 AM  BP/Weight  Systolic BP 136 142 146 168 126 120 129  Diastolic BP 67 76 76 83 72 71 67  Wt. (Lbs) 117.08   118     BMI 18.34 kg/m2   18.48 kg/m2         Latest Ref Rng & Units 05/30/2023   10:20 AM 08/28/2022   12:00 AM  Foot/eye exam completion dates  Eye Exam No Retinopathy  No Retinopathy      Foot Form Completion  Done      This result is from an external source.      Updated lab needed at/ before next visit.

## 2024-01-27 NOTE — Patient Instructions (Addendum)
" °  Annual exam May 24 or after  You are referred to Dr Yasmin , visit is past due  Please get covid vaccine at your pharmacy ( nurse pls verify status and let pt know)  You are referred for chest scan in mid February, Radiology dept will co ntact you with appt info  Fastinf CBC, iron  and ferritin, lipid panel, HBA1C, TSH and vit D  3to 5 days before May appt  Nurse pls order cologuard test and explain importance of follow ythrough for colon cancer screening to pt  Thanks for choosing [facility], we consider it a privelige to serve you.    "

## 2024-01-27 NOTE — Assessment & Plan Note (Signed)
 D/u with urology past due , referral entered

## 2024-01-27 NOTE — Assessment & Plan Note (Signed)
 At baseline , followed closely by pulmonary

## 2024-01-27 NOTE — Progress Notes (Signed)
 "  [name]     [name]: [id number]      DOB: 1950-01-29  Chief Complaint  Patient presents with   Medical Management of Chronic Issues    Follow up     HPI [name] is here for follow up and re-evaluation of chronic medical conditions, medication management and review of any available recent lab and radiology data.  Preventive health is updated, specifically  Cancer screening and Immunization.   Has recovered from recent pneumonia, breathing is back to baseline , needs rept chest scan to determoine clearance  The PT denies any adverse reactions to current medications since the last visit.  There are no new concerns.  There are no specific complaints   ROS Denies recent fever or chills. Denies sinus pressure, nasal congestion, ear pain or sore throat. Denies chest congestion, productive cough or wheezing. Denies chest pains, palpitations and leg swelling Denies abdominal pain, nausea, vomiting,diarrhea or constipation.   Denies dysuria, frequency, hesitancy or incontinence. Denies joint pain, swelling and limitation in mobility. Denies headaches, seizures, numbness, or tingling. Denies depression, anxiety or insomnia. Denies skin break down or rash.   PE  BP 136/67   Pulse (!) 116   Resp 16   Ht 5' 7 (1.702 m)   Wt 117 lb 1.3 oz (53.1 kg)   SpO2 95%   BMI 18.34 kg/m   Patient alert and oriented and in no cardiopulmonary distress.  HEENT: No facial asymmetry, EOMI,     Neck supple .  Chest: Clear to auscultation bilaterally.  CVS: S1, S2 no murmurs, no S3.Regular rate.  ABD: Soft non tender.   Ext: No edema  MS: Adequate ROM spine, shoulders, hips and knees.  Skin: Intact, no ulcerations or rash noted.  Psych: Good eye contact, normal affect. Memory intact not anxious or depressed appearing.  CNS: CN 2-12 intact, power,  normal throughout.no focal deficits noted.   Assessment & Plan  H/O prostate cancer D/u with urology past due , referral  entered  Essential hypertension Controlled, no change in medication DASH diet and commitment to daily physical activity for a minimum of 30 minutes discussed and encouraged, as a part of hypertension management. The importance of attaining a healthy weight is also discussed.     01/27/2024    3:25 PM 12/30/2023   10:05 AM 12/30/2023    9:59 AM 12/30/2023    9:43 AM 12/26/2023    1:15 PM 12/26/2023   11:00 AM 12/26/2023    5:19 AM  BP/Weight  Systolic BP 136 142 146 168 126 120 129  Diastolic BP 67 76 76 83 72 71 67  Wt. (Lbs) 117.08   118     BMI 18.34 kg/m2   18.48 kg/m2          COPD GOLD IV/ group D  At baseline , followed closely by pulmonary  Controlled diabetes mellitus type 2 with complications (HCC) Diabetes associated with hypertension and hyperlipidemia  [name] is reminded of the importance of commitment to daily physical activity for 30 minutes or more, as able and the need to limit carbohydrate intake to 30 to 60 grams per meal to help with blood sugar control.   The need to take medication as prescribed, test blood sugar as directed, and to call between visits if there is a concern that blood sugar is uncontrolled is also discussed.   [name] is reminded of the importance of daily foot exam, annual eye examination, and good blood  sugar, blood pressure and cholesterol control.     Latest Ref Rng & Units 12/26/2023    5:17 AM 12/25/2023    1:47 PM 12/25/2023    9:45 AM 05/30/2023   11:18 AM 03/28/2023    9:00 AM  Diabetic Labs  HbA1c 4.8 - 5.6 %  6.1      Creatinine 0.61 - 1.24 mg/dL 9.04   8.79  8.91  7.82       01/27/2024    3:25 PM 12/30/2023   10:05 AM 12/30/2023    9:59 AM 12/30/2023    9:43 AM 12/26/2023    1:15 PM 12/26/2023   11:00 AM 12/26/2023    5:19 AM  BP/Weight  Systolic BP 136 142 146 168 126 120 129  Diastolic BP 67 76 76 83 72 71 67  Wt. (Lbs) 117.08   118     BMI 18.34 kg/m2   18.48 kg/m2         Latest Ref Rng & Units  05/30/2023   10:20 AM 08/28/2022   12:00 AM  Foot/eye exam completion dates  Eye Exam No Retinopathy  No Retinopathy      Foot Form Completion  Done      This result is from an external source.      Updated lab needed at/ before next visit.   Community acquired pneumonia Rept chest scan t ensure clearance  "

## 2024-01-27 NOTE — Assessment & Plan Note (Signed)
 Rept chest scan t ensure clearance

## 2024-02-02 ENCOUNTER — Ambulatory Visit: Admitting: Internal Medicine

## 2024-02-04 NOTE — Progress Notes (Unsigned)
 "   Impression/Assessment:  Grade group 2 prostate cancer, 2 years out from brachytherapy, doing quite well  Plan:  I will see him back in 6 months with PSA  History of Present Illness: Here for follow-up of prostate cancer-treated.  9.14.2022: TRUS/Bx. PSA 9.5, prostate volume 33.6 mL, PSAD 0.28.   1/12 cores (RT base lateral) revealed GG 2 pattern in 40 % of core.  He comes with his friend, Dickey Fickle.  He denies significant lower urinary tract symptoms.  Kattan nomogram predictions  Extracapsular disease-59% Seminal vesicle/lymph node involvement-3% each 5/10-year progression free survival with radical prostatectomy-81/69%  4.3.2023: He underwent I-125 brachytherapy and placement of SpaceOAR.  4.1.2025: PSA 0.4.  From a urologic standpoint he is doing well.  He was recently treated for pneumonia by Dr. Antonetta.  He has not seen blood in his urine or stool.  He denies any real problems urinating/dysuria.  2.3.2026:   Past Medical History:  Diagnosis Date   Asthma    CAP (community acquired pneumonia) 12/10/2018   COPD (chronic obstructive pulmonary disease) (HCC)    COPD with acute exacerbation (HCC) 04/18/2018   CVA (cerebral vascular accident) (HCC) 2008   with temporary vision loss    Depression    ECHOCARDIOGRAM, ABNORMAL 03/06/2010   Qualifier: Diagnosis of  By: Jerilynn, NP, Kathryn     Elevated PSA 2014   no diagnosis pf prostate cancer in 07/2014   History of substance abuse (HCC) 01/14/2011   Marijuana   History of substance abuse (HCC) 01/14/2011   History of tobacco abuse 01/14/2011   Hyperlipidemia    Hypertension    Lobar pneumonia 12/13/2018   Marijuana abuse    Nicotine addiction    Prediabetes 2014    Past Surgical History:  Procedure Laterality Date   COLONOSCOPY     RADIOACTIVE SEED IMPLANT N/A 04/09/2021   Procedure: RADIOACTIVE SEED IMPLANT/BRACHYTHERAPY IMPLANT/64 SEEDS;  Surgeon: Matilda Senior, MD;  Location: [facility];  Service: Urology;   Laterality: N/A;  90 MINS   SPACE OAR INSTILLATION N/A 04/09/2021   Procedure: SPACE OAR INSTILLATION;  Surgeon: Matilda Senior, MD;  Location: [facility];  Service: Urology;  Laterality: N/A;    Home Medications:  Allergies as of 02/10/2024   No Known Allergies      Medication List        Accurate as of February 04, 2024  2:55 PM. If you have any questions, ask your nurse or doctor.          albuterol  (2.5 MG/3ML) 0.083% nebulizer solution Commonly known as: PROVENTIL  Take 3 mLs (2.5 mg total) by nebulization every 6 (six) hours as needed for wheezing or shortness of breath.   albuterol  108 (90 Base) MCG/ACT inhaler Commonly known as: VENTOLIN  HFA Inhale 1-2 puffs into the lungs every 6 (six) hours as needed for wheezing or shortness of breath.   amLODipine  10 MG tablet Commonly known as: NORVASC  Take 1 tablet (10 mg total) by mouth daily.   aspirin  EC 81 MG tablet Take 81 mg by mouth daily. Swallow whole.   Breztri  Aerosphere 160-9-4.8 MCG/ACT Aero inhaler Generic drug: budesonide -glycopyrrolate-formoterol  Take 2 puffs first thing in am and then another 2 puffs about 12 hours later.   Iron  (Ferrous Sulfate ) 325 (65 Fe) MG Tabs Take 325 mg by mouth daily.   levocetirizine 5 MG tablet Commonly known as: XYZAL  Take 1 tablet (5 mg total) by mouth every evening.   metFORMIN  500 MG tablet Commonly known as: GLUCOPHAGE  TAKE ONE  TABLET BY MOUTH TWICE DAILY WITH A MEAL   montelukast  10 MG tablet Commonly known as: SINGULAIR  Take 1 tablet (10 mg total) by mouth at bedtime.   omeprazole  20 MG capsule Commonly known as: PRILOSEC Take 1 capsule (20 mg total) by mouth daily.   predniSONE  5 MG tablet Commonly known as: DELTASONE  One half pill daily   rosuvastatin  20 MG tablet Commonly known as: CRESTOR  TAKE ONE TABLET BY MOUTH EVERY DAY   triamterene -hydrochlorothiazide  37.5-25 MG tablet Commonly known as: MAXZIDE -25 TAKE ONE AND ONE-HALF TABLETS EVERY DAY         Allergies: No Known Allergies  No family history on file.  Social History:  reports that he quit smoking about 8 years ago. His smoking use included cigarettes. He started smoking about 48 years ago. He has a 20 pack-year smoking history. He has been exposed to tobacco smoke. He has never used smokeless tobacco. He reports that he does not currently use alcohol after a past usage of about 3.0 standard drinks of alcohol per week. He reports that he does not use drugs.  ROS: A complete review of systems was performed.  All systems are negative except for pertinent findings as noted.  Physical Exam:  Vital signs in last 24 hours: There were no vitals taken for this visit. Constitutional:  Alert and oriented, No acute distress.  Quite thin. Cardiovascular: Regular rate  Respiratory: Normal respiratory effort Neurologic: Grossly intact, no focal deficits Psychiatric: Normal mood and affect  I have reviewed prior pt notes  I have reviewed urinalysis results  I have independently reviewed prior imaging--prior prostate U/S  I have reviewed prior PSA and pathology results      "

## 2024-02-10 ENCOUNTER — Ambulatory Visit: Admitting: Urology

## 2024-02-10 DIAGNOSIS — Z8546 Personal history of malignant neoplasm of prostate: Secondary | ICD-10-CM

## 2024-02-12 ENCOUNTER — Ambulatory Visit: Admitting: Internal Medicine

## 2024-02-12 DIAGNOSIS — J449 Chronic obstructive pulmonary disease, unspecified: Secondary | ICD-10-CM

## 2024-02-12 DIAGNOSIS — G4734 Idiopathic sleep related nonobstructive alveolar hypoventilation: Secondary | ICD-10-CM

## 2024-02-12 DIAGNOSIS — J310 Chronic rhinitis: Secondary | ICD-10-CM

## 2024-02-12 NOTE — Progress Notes (Unsigned)
 "   [name], male    DOB: 01-05-1951,  [name]: [id number]   Brief patient profile:  73 yobm quit smoking 2017  illiterate   grew up with dx of asthma but only started needing in saba in HS when started smoking and eventually placed on advair  500 and followed by Dr Vonzell and referred to pulmonary clinic in [facility]  05/11/2019 by Chiquita Barefoot p Dr Vonzell retired     History of Present Illness  05/11/2019  Pulmonary/ 1st office eval/Elesha Thedford on advair  500  Chief Complaint  Patient presents with   Pulmonary Consult    Referred by Chiquita Barefoot, NP. Former patient of Dr Vonzell. He states his breathing has been worse since he had first covid vaccine 03/03/19. He gets SOB when he wakes up in the am and starts moving around. He has cough with cream colored sputum. He is using his albuterol  inhaler about every 6 hours and duonebs 2 x daily.   Dyspnea:  Can't walk a block = MMRC3 = can't walk 100 yards even at a slow pace at a flat grade s stopping due to sob   Cough: min white mucus  Sleep: bed is flat 2 pillows  SABA use: way too much as above 02 :  Only uses 02 p sits down  rec Stop wixela, atrovent  (ipatropium solution) and combivent   Plan A = Automatic = Always=    Trelegy one click each am - take two good drags Plan B = Backup (to supplement plan A, not to replace it) Only use your albuterol  inhaler (PROAIR )  as a rescue medication   Plan C = Crisis (instead of Plan B but only if Plan B stops working) - only use your albuterol  nebulizer if you first try Plan B and it fails to help > ok to use the nebulizer up to every 4 hours but if start needing it regularly call for immediate appointment  Prednisone  10 mg take  4 each am x 2 days,   2 each am x 2 days,  1 each am x 2 days and stop  Make sure you check your oxygen  saturations at highest level of activity (not after you stop!) to be sure it stays over 90% and adjust upward to maintain this level if needed but remember to turn it back to  previous settings when you stop (to conserve your supply).     CT chest 07/05/19 >> mild centrilobular and paraseptal emphysema  PFT 07/29/19 >> FEV1 0.63 (24%),  Ratio 0.32, TLC 9.24 (143%), DLCO 63%      11/25/2019  f/u ov/[place] office/Koehn Salehi re: chest tight  Chief Complaint  Patient presents with   Follow-up    feel tight in chest since took breztri  this morning   Dyspnea:  Worse today so took 2 prednisone , has been on breztri  2bid but says got worse after am dose with chest tightness generailized, did not think to try his neb, rode moped instead to office and requested to be seen  Cough: dry  Sleeping: ok  SABA use: about twice daily / neb not today/ very limited insight into how/ when to use meds  02: prn  Rec No change in medications/ instructions    [place] eval 10/07/22 COPD/bronchiectasis exacerbation. - will give him a course of cefuroxime    Severe COPD with emphysema. - previously seen by Dr. Vonzell and Dr. Darlean - has been prednisone  dependent since July 2021 - don't think he needs an increase in prednisone   at this time - continue trelegy 100 one puff daily, prednisone  2.5 mg daily, singulair  10 mg nightly - prn albuterol  - he has a nebulizer   Bronchiectasis. - likely from prior episodes of pneumonia - prn mucinex , flutter valve   Chronic hypoxic/hypercapnic respiratory failure. - 3 liters with exertion and sleep - uses [employer] for his DME - discussed importance of maintaining compliance with supplemental oxygen    Lung nodule. - he will need follow up low dose CT chest in January 2025   Social determinants of health. - he has limited reading ability which impacts his ability to follow through with medical instructions   05/09/2023  Re-establish  ov/[place] office/Terilyn Sano re: GOLD 4 COPD on prn 02  maint on trelegy / pred 5 mg half daily /  did  bring meds / uses trelegy at hs s noct symptoms historically  Chief Complaint  Patient presents with   COPD    Shortness of Breath   Cough  Dyspnea:  able to do [employer]/ grocery shopping good pace  Cough: no am flares - says  nasal congestion from nasal 02 / some green mucus from nose and throat  Sleeping: bed is flat/ 2 pillows    resp cc  SABA use: hfa once a day / neb once a week  02: 3lpm hs  Lung cancer screening: q feb  Rec We will ask your DME company to humidify your oxygen   Plan A = Automatic = Always=  Trelegy 100  one click each am  Plan B = Backup (to supplement plan A, not to replace it) Only use your albuterol  inhaler as a rescue  Plan C = Crisis (instead of Plan B but only if Plan B stops working) - only use your albuterol  nebulizer if you first try Plan B  Augmentin  875 mg take one pill twice daily  X 10 days -        08/08/2023 3 m f/u ov/[place] office/Akiel Fennell re: GOLD 4 COPD on prn 02   maint on trelegy 100 , singulair  and prednisone  5 mg per Dr Antonetta with persistent  nasal congestion x ever since covid (he gestures this was related to nasal testing, not the infection or the vaccine to his knowledge  Chief Complaint  Patient presents with   Follow-up   COPD  Dyspnea:  yardwork / lifting can still walk at [employer] same pace as others / mb and back is 50 ft slt uphill does fine  Cough: assoc with use of trelegy  = dry / worse p trelegy q am  Sleeping: flat bed  / one pillow s resp cc  SABA use: sev times per day usually p exertion  02: 3lpm hs none dayime  Lung cancer screening: overdue for f/u (Feb scan was supposed to be repeated at 6 m)  Rec My office will be contacting you by phone for referral to CT CHEST LCS NODULE FOLLOW-UP W/O CM)  Change Trelegy 100 to breztri  Take 2 puffs first thing in am and then another 2 puffs about 12 hours later.  Work on inhaler technique:     Please schedule a follow up visit in 3 months but call sooner if needed   Allergy screen 08/08/23 >  Eos 0. 3/  IgE  47   10/28/2023  f/u ov/[place] office/Ygnacio Fecteau re: GOLD 4 COPD on prn 02   maint on breztri   and prednisone  5 mg  one half  Chief Complaint  Patient presents with   Shortness of  Breath    Doe in the a.m  Coughing green mucus    Dyspnea:  no change doe on breztri  12 and 12 (misunderstood the 12 hour rule  Cough: green mucus since started breztri  minimal production of use  Sleeping: flat bed one pillow s  noct resp cc  SABA use: twice daily after ex  02: 3lpm noct  Lung cancer screening: try again  10/28/2023   Patient Instructions  Breztri  is  Take 2 puffs first thing in am and then another 2 puffs about 12 hours later.   Work on inhaler technique: Use your albuterol  as a rescue medication Also  Ok to try albuterol  15 min before an activity (on alternating days)  that you know would usually make you short of breath Please schedule a follow up visit in 6  months but call sooner if needed  with all medications /inhalers/ solutions in hand  LDSCT  11/09/23  Centrilobular and paraseptal emphysema. Cylindrical Bronchiectasis with patchy bilateral reticulonodular/tree-in-bud opacification   CTa  12/25/23  Continued evidence of patchy bilateral reticulonodular/tree-in-bud opacification with more discrete areas of nodular opacification and mucoid impaction. Slight overall progression compared to the prior exam. Findings likely due to an atypical infectious process such as MAC versus inflammatory process  02/12/2024  f/u ov/[place] office/Isom Kochan re: GOLD 4/ goup D copd 02 dep  maint on ***  did *** bring meds  No chief complaint on file.   Dyspnea:  *** Cough: *** Sleeping: ***   resp cc  SABA use: *** 02: ***  Lung cancer screening: ***   No obvious day to day or daytime variability or assoc excess/ purulent sputum or mucus plugs or hemoptysis or cp or chest tightness, subjective wheeze or overt sinus or hb symptoms.    Also denies any obvious fluctuation of symptoms with weather or environmental changes or other aggravating or alleviating factors except  as outlined above   No unusual exposure hx or h/o childhood pna/ asthma or knowledge of premature birth.  Current Allergies, Complete Past Medical History, Past Surgical History, Family History, and Social History were reviewed in [employer] record.  ROS  The following are not active complaints unless bolded Hoarseness, sore throat, dysphagia, dental problems, itching, sneezing,  nasal congestion or discharge of excess mucus or purulent secretions, ear ache,   fever, chills, sweats, unintended wt loss or wt gain, classically pleuritic or exertional cp,  orthopnea pnd or arm/hand swelling  or leg swelling, presyncope, palpitations, abdominal pain, anorexia, nausea, vomiting, diarrhea  or change in bowel habits or change in bladder habits, change in stools or change in urine, dysuria, hematuria,  rash, arthralgias, visual complaints, headache, numbness, weakness or ataxia or problems with walking or coordination,  change in mood or  memory.         Outpatient Medications Prior to Visit  Medication Sig Dispense Refill   albuterol  (PROVENTIL ) (2.5 MG/3ML) 0.083% nebulizer solution Take 3 mLs (2.5 mg total) by nebulization every 6 (six) hours as needed for wheezing or shortness of breath. 180 mL 3   albuterol  (VENTOLIN  HFA) 108 (90 Base) MCG/ACT inhaler Inhale 1-2 puffs into the lungs every 6 (six) hours as needed for wheezing or shortness of breath. 8.5 g 5   amLODipine  (NORVASC ) 10 MG tablet Take 1 tablet (10 mg total) by mouth daily. 90 tablet 3   aspirin  EC 81 MG tablet Take 81 mg by mouth daily. Swallow whole.     budesonide -glycopyrrolate-formoterol  (BREZTRI  AEROSPHERE) 160-9-4.8  MCG/ACT AERO inhaler Take 2 puffs first thing in am and then another 2 puffs about 12 hours later. 10.7 g 11   Iron , Ferrous Sulfate , 325 (65 Fe) MG TABS Take 325 mg by mouth daily. 100 tablet 1   levocetirizine (XYZAL ) 5 MG tablet Take 1 tablet (5 mg total) by mouth every evening. 30 tablet 2    metFORMIN  (GLUCOPHAGE ) 500 MG tablet TAKE ONE TABLET BY MOUTH TWICE DAILY WITH A MEAL 180 tablet 1   montelukast  (SINGULAIR ) 10 MG tablet Take 1 tablet (10 mg total) by mouth at bedtime. 90 tablet 3   omeprazole  (PRILOSEC) 20 MG capsule Take 1 capsule (20 mg total) by mouth daily. 90 capsule 3   predniSONE  (DELTASONE ) 5 MG tablet One half pill daily 90 tablet 3   rosuvastatin  (CRESTOR ) 20 MG tablet TAKE ONE TABLET BY MOUTH EVERY DAY 100 tablet 3   triamterene -hydrochlorothiazide  (MAXZIDE -25) 37.5-25 MG tablet TAKE ONE AND ONE-HALF TABLETS EVERY DAY 135 tablet 1   No facility-administered medications prior to visit.           Past Medical History:  Diagnosis Date   Asthma    CAP (community acquired pneumonia) 12/10/2018   COPD (chronic obstructive pulmonary disease) (HCC)    COPD with acute exacerbation (HCC) 04/18/2018   CVA (cerebral vascular accident) (HCC) 2008   with temporary vision loss    Depression    ECHOCARDIOGRAM, ABNORMAL 03/06/2010   Qualifier: Diagnosis of  By: Jerilynn, NP, Kathryn     Elevated PSA 2014   no diagnosis pf prostate cancer in 07/2014   History of substance abuse (HCC) 01/14/2011   Marijuana   History of substance abuse (HCC) 01/14/2011   History of tobacco abuse 01/14/2011   Hyperlipidemia    Hypertension    Lobar pneumonia (HCC) 12/13/2018   Marijuana abuse    Nicotine addiction    Prediabetes 2014      Objective:    Wts  02/12/2024            ***  10/28/2023       114 08/08/2023          112  05/09/2023          111  11/25/2019      124 10/15/2019        126  08/20/2019        130  08/03/2019        122  07/28/2019        121  07/09/2019          122   06/10/2019         122   05/28/19 118 lb 8 oz (53.8 kg)  05/11/19 122 lb (55.3 kg)  04/16/19 122 lb 12.8 oz (55.7 kg)    Vital signs reviewed  02/12/2024  - Note at rest 02 sats  ***% on ***   General appearance:    ***    edentulous   Mod barr==***                   Assessment         "

## 2024-02-24 ENCOUNTER — Ambulatory Visit (HOSPITAL_COMMUNITY)

## 2024-02-25 ENCOUNTER — Ambulatory Visit: Admitting: Urology

## 2024-06-01 ENCOUNTER — Encounter: Payer: Self-pay | Admitting: Family Medicine

## 2024-09-07 ENCOUNTER — Ambulatory Visit
